# Patient Record
Sex: Female | Born: 1965
Health system: Southern US, Community
[De-identification: ages and names within clinical notes are randomized; demographics above are authoritative.]

## PROBLEM LIST (undated history)

## (undated) DIAGNOSIS — F319 Bipolar disorder, unspecified: Secondary | ICD-10-CM

## (undated) DIAGNOSIS — R51 Headache: Secondary | ICD-10-CM

## (undated) DIAGNOSIS — K6289 Other specified diseases of anus and rectum: Secondary | ICD-10-CM

## (undated) DIAGNOSIS — E039 Hypothyroidism, unspecified: Secondary | ICD-10-CM

## (undated) DIAGNOSIS — J189 Pneumonia, unspecified organism: Secondary | ICD-10-CM

## (undated) DIAGNOSIS — K635 Polyp of colon: Secondary | ICD-10-CM

## (undated) DIAGNOSIS — M898X9 Other specified disorders of bone, unspecified site: Secondary | ICD-10-CM

## (undated) DIAGNOSIS — M5126 Other intervertebral disc displacement, lumbar region: Secondary | ICD-10-CM

## (undated) DIAGNOSIS — K589 Irritable bowel syndrome without diarrhea: Secondary | ICD-10-CM

## (undated) DIAGNOSIS — F419 Anxiety disorder, unspecified: Secondary | ICD-10-CM

## (undated) DIAGNOSIS — M1990 Unspecified osteoarthritis, unspecified site: Secondary | ICD-10-CM

## (undated) DIAGNOSIS — B029 Zoster without complications: Secondary | ICD-10-CM

## (undated) DIAGNOSIS — F1910 Other psychoactive substance abuse, uncomplicated: Secondary | ICD-10-CM

## (undated) DIAGNOSIS — Z8659 Personal history of other mental and behavioral disorders: Secondary | ICD-10-CM

## (undated) DIAGNOSIS — C9110 Chronic lymphocytic leukemia of B-cell type not having achieved remission: Secondary | ICD-10-CM

## (undated) DIAGNOSIS — R519 Headache, unspecified: Secondary | ICD-10-CM

## (undated) DIAGNOSIS — F329 Major depressive disorder, single episode, unspecified: Secondary | ICD-10-CM

## (undated) DIAGNOSIS — J45909 Unspecified asthma, uncomplicated: Secondary | ICD-10-CM

## (undated) DIAGNOSIS — G8929 Other chronic pain: Secondary | ICD-10-CM

## (undated) DIAGNOSIS — F32A Depression, unspecified: Secondary | ICD-10-CM

## (undated) HISTORY — DX: Other specified diseases of anus and rectum: K62.89

## (undated) HISTORY — DX: Polyp of colon: K63.5

## (undated) HISTORY — DX: Unspecified asthma, uncomplicated: J45.909

## (undated) HISTORY — PX: APPENDECTOMY: SHX54

## (undated) HISTORY — DX: Unspecified osteoarthritis, unspecified site: M19.90

## (undated) HISTORY — DX: Zoster without complications: B02.9

## (undated) HISTORY — DX: Personal history of other mental and behavioral disorders: Z86.59

## (undated) HISTORY — DX: Anxiety disorder, unspecified: F41.9

## (undated) HISTORY — DX: Irritable bowel syndrome without diarrhea: K58.9

## (undated) HISTORY — PX: OOPHORECTOMY: SHX86

## (undated) HISTORY — DX: Pneumonia, unspecified organism: J18.9

## (undated) HISTORY — DX: Other chronic pain: G89.29

## (undated) HISTORY — DX: Other specified disorders of bone, unspecified site: M89.8X9

## (undated) HISTORY — DX: Major depressive disorder, single episode, unspecified: F32.9

## (undated) HISTORY — PX: TUBAL LIGATION: SHX77

## (undated) HISTORY — DX: Other intervertebral disc displacement, lumbar region: M51.26

## (undated) HISTORY — DX: Depression, unspecified: F32.A

## (undated) HISTORY — DX: Headache, unspecified: R51.9

## (undated) HISTORY — PX: CHOLECYSTECTOMY: SHX55

## (undated) HISTORY — DX: Bipolar disorder, unspecified: F31.9

## (undated) HISTORY — DX: Chronic lymphocytic leukemia of B-cell type not having achieved remission: C91.10

## (undated) HISTORY — DX: Headache: R51

## (undated) HISTORY — DX: Other psychoactive substance abuse, uncomplicated: F19.10

## (undated) HISTORY — PX: ABDOMINAL HYSTERECTOMY: SHX81

## (undated) HISTORY — DX: Hypothyroidism, unspecified: E03.9

---

## 1993-07-20 DIAGNOSIS — K589 Irritable bowel syndrome without diarrhea: Secondary | ICD-10-CM

## 1993-07-20 HISTORY — DX: Irritable bowel syndrome, unspecified: K58.9

## 2005-07-19 DIAGNOSIS — M5136 Other intervertebral disc degeneration, lumbar region: Secondary | ICD-10-CM

## 2005-07-19 DIAGNOSIS — M5126 Other intervertebral disc displacement, lumbar region: Secondary | ICD-10-CM

## 2005-07-19 DIAGNOSIS — M51369 Other intervertebral disc degeneration, lumbar region without mention of lumbar back pain or lower extremity pain: Secondary | ICD-10-CM

## 2005-07-19 HISTORY — DX: Other intervertebral disc degeneration, lumbar region: M51.36

## 2005-07-19 HISTORY — DX: Other intervertebral disc degeneration, lumbar region without mention of lumbar back pain or lower extremity pain: M51.369

## 2005-07-19 HISTORY — DX: Other intervertebral disc displacement, lumbar region: M51.26

## 2005-07-20 DIAGNOSIS — E039 Hypothyroidism, unspecified: Secondary | ICD-10-CM

## 2005-07-20 HISTORY — DX: Hypothyroidism, unspecified: E03.9

## 2010-03-20 HISTORY — PX: SHOULDER OPEN ROTATOR CUFF REPAIR: SHX2407

## 2012-06-19 HISTORY — PX: BUNIONECTOMY: SHX129

## 2013-07-25 DIAGNOSIS — F3160 Bipolar disorder, current episode mixed, unspecified: Secondary | ICD-10-CM | POA: Diagnosis not present

## 2013-07-25 DIAGNOSIS — F3289 Other specified depressive episodes: Secondary | ICD-10-CM | POA: Diagnosis not present

## 2013-07-25 DIAGNOSIS — Z79899 Other long term (current) drug therapy: Secondary | ICD-10-CM | POA: Diagnosis not present

## 2013-07-25 DIAGNOSIS — F329 Major depressive disorder, single episode, unspecified: Secondary | ICD-10-CM | POA: Diagnosis not present

## 2013-07-25 DIAGNOSIS — F603 Borderline personality disorder: Secondary | ICD-10-CM | POA: Diagnosis not present

## 2013-07-25 DIAGNOSIS — F319 Bipolar disorder, unspecified: Secondary | ICD-10-CM | POA: Diagnosis not present

## 2013-07-25 DIAGNOSIS — Z5181 Encounter for therapeutic drug level monitoring: Secondary | ICD-10-CM | POA: Diagnosis not present

## 2013-07-26 DIAGNOSIS — M545 Low back pain, unspecified: Secondary | ICD-10-CM | POA: Diagnosis not present

## 2013-07-26 DIAGNOSIS — M47817 Spondylosis without myelopathy or radiculopathy, lumbosacral region: Secondary | ICD-10-CM | POA: Diagnosis not present

## 2013-07-26 DIAGNOSIS — Z79899 Other long term (current) drug therapy: Secondary | ICD-10-CM | POA: Diagnosis not present

## 2013-07-26 DIAGNOSIS — M4120 Other idiopathic scoliosis, site unspecified: Secondary | ICD-10-CM | POA: Diagnosis not present

## 2013-07-26 DIAGNOSIS — Z0000 Encounter for general adult medical examination without abnormal findings: Secondary | ICD-10-CM | POA: Diagnosis not present

## 2013-07-26 DIAGNOSIS — E039 Hypothyroidism, unspecified: Secondary | ICD-10-CM | POA: Diagnosis not present

## 2013-07-26 DIAGNOSIS — F319 Bipolar disorder, unspecified: Secondary | ICD-10-CM | POA: Diagnosis not present

## 2013-07-26 DIAGNOSIS — E559 Vitamin D deficiency, unspecified: Secondary | ICD-10-CM | POA: Diagnosis not present

## 2013-08-01 DIAGNOSIS — F3289 Other specified depressive episodes: Secondary | ICD-10-CM | POA: Diagnosis not present

## 2013-08-01 DIAGNOSIS — F329 Major depressive disorder, single episode, unspecified: Secondary | ICD-10-CM | POA: Diagnosis not present

## 2013-08-08 DIAGNOSIS — F3289 Other specified depressive episodes: Secondary | ICD-10-CM | POA: Diagnosis not present

## 2013-08-08 DIAGNOSIS — F329 Major depressive disorder, single episode, unspecified: Secondary | ICD-10-CM | POA: Diagnosis not present

## 2013-08-15 DIAGNOSIS — F3289 Other specified depressive episodes: Secondary | ICD-10-CM | POA: Diagnosis not present

## 2013-08-15 DIAGNOSIS — F329 Major depressive disorder, single episode, unspecified: Secondary | ICD-10-CM | POA: Diagnosis not present

## 2013-08-17 DIAGNOSIS — K5900 Constipation, unspecified: Secondary | ICD-10-CM | POA: Diagnosis not present

## 2013-08-17 DIAGNOSIS — R1032 Left lower quadrant pain: Secondary | ICD-10-CM | POA: Diagnosis not present

## 2013-08-17 DIAGNOSIS — K6289 Other specified diseases of anus and rectum: Secondary | ICD-10-CM | POA: Diagnosis not present

## 2013-08-22 DIAGNOSIS — F329 Major depressive disorder, single episode, unspecified: Secondary | ICD-10-CM | POA: Diagnosis not present

## 2013-08-22 DIAGNOSIS — F3289 Other specified depressive episodes: Secondary | ICD-10-CM | POA: Diagnosis not present

## 2013-08-29 DIAGNOSIS — F329 Major depressive disorder, single episode, unspecified: Secondary | ICD-10-CM | POA: Diagnosis not present

## 2013-08-29 DIAGNOSIS — F3289 Other specified depressive episodes: Secondary | ICD-10-CM | POA: Diagnosis not present

## 2013-08-30 DIAGNOSIS — M47817 Spondylosis without myelopathy or radiculopathy, lumbosacral region: Secondary | ICD-10-CM | POA: Diagnosis not present

## 2013-08-30 DIAGNOSIS — M51379 Other intervertebral disc degeneration, lumbosacral region without mention of lumbar back pain or lower extremity pain: Secondary | ICD-10-CM | POA: Diagnosis not present

## 2013-08-30 DIAGNOSIS — M5137 Other intervertebral disc degeneration, lumbosacral region: Secondary | ICD-10-CM | POA: Diagnosis not present

## 2013-08-30 DIAGNOSIS — IMO0002 Reserved for concepts with insufficient information to code with codable children: Secondary | ICD-10-CM | POA: Diagnosis not present

## 2013-09-07 DIAGNOSIS — F329 Major depressive disorder, single episode, unspecified: Secondary | ICD-10-CM | POA: Diagnosis not present

## 2013-09-07 DIAGNOSIS — F3289 Other specified depressive episodes: Secondary | ICD-10-CM | POA: Diagnosis not present

## 2013-09-11 DIAGNOSIS — F4310 Post-traumatic stress disorder, unspecified: Secondary | ICD-10-CM | POA: Diagnosis not present

## 2013-09-11 DIAGNOSIS — F411 Generalized anxiety disorder: Secondary | ICD-10-CM | POA: Diagnosis not present

## 2013-09-11 DIAGNOSIS — F319 Bipolar disorder, unspecified: Secondary | ICD-10-CM | POA: Diagnosis not present

## 2013-09-11 DIAGNOSIS — E039 Hypothyroidism, unspecified: Secondary | ICD-10-CM | POA: Diagnosis not present

## 2013-09-19 DIAGNOSIS — F3289 Other specified depressive episodes: Secondary | ICD-10-CM | POA: Diagnosis not present

## 2013-09-19 DIAGNOSIS — F329 Major depressive disorder, single episode, unspecified: Secondary | ICD-10-CM | POA: Diagnosis not present

## 2013-09-19 DIAGNOSIS — Z5181 Encounter for therapeutic drug level monitoring: Secondary | ICD-10-CM | POA: Diagnosis not present

## 2013-09-19 DIAGNOSIS — Z79899 Other long term (current) drug therapy: Secondary | ICD-10-CM | POA: Diagnosis not present

## 2013-09-19 DIAGNOSIS — F3160 Bipolar disorder, current episode mixed, unspecified: Secondary | ICD-10-CM | POA: Diagnosis not present

## 2013-09-19 DIAGNOSIS — F603 Borderline personality disorder: Secondary | ICD-10-CM | POA: Diagnosis not present

## 2013-09-21 DIAGNOSIS — F3289 Other specified depressive episodes: Secondary | ICD-10-CM | POA: Diagnosis not present

## 2013-09-21 DIAGNOSIS — F329 Major depressive disorder, single episode, unspecified: Secondary | ICD-10-CM | POA: Diagnosis not present

## 2013-09-21 DIAGNOSIS — M545 Low back pain, unspecified: Secondary | ICD-10-CM | POA: Diagnosis not present

## 2013-09-28 DIAGNOSIS — F3289 Other specified depressive episodes: Secondary | ICD-10-CM | POA: Diagnosis not present

## 2013-09-28 DIAGNOSIS — F329 Major depressive disorder, single episode, unspecified: Secondary | ICD-10-CM | POA: Diagnosis not present

## 2013-10-03 DIAGNOSIS — K589 Irritable bowel syndrome without diarrhea: Secondary | ICD-10-CM | POA: Diagnosis not present

## 2013-10-03 DIAGNOSIS — E039 Hypothyroidism, unspecified: Secondary | ICD-10-CM | POA: Diagnosis not present

## 2013-10-03 DIAGNOSIS — Z79899 Other long term (current) drug therapy: Secondary | ICD-10-CM | POA: Diagnosis not present

## 2013-10-03 DIAGNOSIS — F603 Borderline personality disorder: Secondary | ICD-10-CM | POA: Diagnosis present

## 2013-10-03 DIAGNOSIS — Z5181 Encounter for therapeutic drug level monitoring: Secondary | ICD-10-CM | POA: Diagnosis not present

## 2013-10-03 DIAGNOSIS — J329 Chronic sinusitis, unspecified: Secondary | ICD-10-CM | POA: Diagnosis not present

## 2013-10-03 DIAGNOSIS — M542 Cervicalgia: Secondary | ICD-10-CM | POA: Diagnosis not present

## 2013-10-03 DIAGNOSIS — F319 Bipolar disorder, unspecified: Secondary | ICD-10-CM | POA: Diagnosis not present

## 2013-10-03 DIAGNOSIS — F39 Unspecified mood [affective] disorder: Secondary | ICD-10-CM | POA: Diagnosis not present

## 2013-10-03 DIAGNOSIS — J45909 Unspecified asthma, uncomplicated: Secondary | ICD-10-CM | POA: Diagnosis not present

## 2013-10-10 DIAGNOSIS — F329 Major depressive disorder, single episode, unspecified: Secondary | ICD-10-CM | POA: Diagnosis not present

## 2013-10-10 DIAGNOSIS — F3289 Other specified depressive episodes: Secondary | ICD-10-CM | POA: Diagnosis not present

## 2013-10-11 DIAGNOSIS — F3160 Bipolar disorder, current episode mixed, unspecified: Secondary | ICD-10-CM | POA: Diagnosis not present

## 2013-10-11 DIAGNOSIS — F319 Bipolar disorder, unspecified: Secondary | ICD-10-CM | POA: Diagnosis not present

## 2013-10-11 DIAGNOSIS — Z79899 Other long term (current) drug therapy: Secondary | ICD-10-CM | POA: Diagnosis not present

## 2013-10-11 DIAGNOSIS — Z5181 Encounter for therapeutic drug level monitoring: Secondary | ICD-10-CM | POA: Diagnosis not present

## 2013-10-12 DIAGNOSIS — F329 Major depressive disorder, single episode, unspecified: Secondary | ICD-10-CM | POA: Diagnosis not present

## 2013-10-12 DIAGNOSIS — F3289 Other specified depressive episodes: Secondary | ICD-10-CM | POA: Diagnosis not present

## 2013-10-18 DIAGNOSIS — Z79899 Other long term (current) drug therapy: Secondary | ICD-10-CM | POA: Diagnosis not present

## 2013-10-18 DIAGNOSIS — F3110 Bipolar disorder, current episode manic without psychotic features, unspecified: Secondary | ICD-10-CM | POA: Diagnosis not present

## 2013-10-18 DIAGNOSIS — F603 Borderline personality disorder: Secondary | ICD-10-CM | POA: Diagnosis not present

## 2013-10-18 DIAGNOSIS — F3160 Bipolar disorder, current episode mixed, unspecified: Secondary | ICD-10-CM | POA: Diagnosis not present

## 2013-10-18 DIAGNOSIS — Z5181 Encounter for therapeutic drug level monitoring: Secondary | ICD-10-CM | POA: Diagnosis not present

## 2013-10-19 DIAGNOSIS — F3289 Other specified depressive episodes: Secondary | ICD-10-CM | POA: Diagnosis not present

## 2013-10-19 DIAGNOSIS — F329 Major depressive disorder, single episode, unspecified: Secondary | ICD-10-CM | POA: Diagnosis not present

## 2013-10-24 DIAGNOSIS — F329 Major depressive disorder, single episode, unspecified: Secondary | ICD-10-CM | POA: Diagnosis not present

## 2013-10-24 DIAGNOSIS — F3289 Other specified depressive episodes: Secondary | ICD-10-CM | POA: Diagnosis not present

## 2013-10-26 DIAGNOSIS — F329 Major depressive disorder, single episode, unspecified: Secondary | ICD-10-CM | POA: Diagnosis not present

## 2013-10-26 DIAGNOSIS — F3289 Other specified depressive episodes: Secondary | ICD-10-CM | POA: Diagnosis not present

## 2013-10-27 DIAGNOSIS — N842 Polyp of vagina: Secondary | ICD-10-CM | POA: Diagnosis not present

## 2013-10-27 DIAGNOSIS — J309 Allergic rhinitis, unspecified: Secondary | ICD-10-CM | POA: Diagnosis not present

## 2013-10-31 DIAGNOSIS — F3289 Other specified depressive episodes: Secondary | ICD-10-CM | POA: Diagnosis not present

## 2013-10-31 DIAGNOSIS — F329 Major depressive disorder, single episode, unspecified: Secondary | ICD-10-CM | POA: Diagnosis not present

## 2013-11-09 DIAGNOSIS — R6889 Other general symptoms and signs: Secondary | ICD-10-CM | POA: Diagnosis not present

## 2013-11-09 DIAGNOSIS — R110 Nausea: Secondary | ICD-10-CM | POA: Diagnosis not present

## 2013-11-09 DIAGNOSIS — R51 Headache: Secondary | ICD-10-CM | POA: Diagnosis not present

## 2013-11-09 DIAGNOSIS — Z882 Allergy status to sulfonamides status: Secondary | ICD-10-CM | POA: Diagnosis not present

## 2013-11-09 DIAGNOSIS — Z880 Allergy status to penicillin: Secondary | ICD-10-CM | POA: Diagnosis not present

## 2013-11-09 DIAGNOSIS — R42 Dizziness and giddiness: Secondary | ICD-10-CM | POA: Diagnosis not present

## 2013-11-10 DIAGNOSIS — M47817 Spondylosis without myelopathy or radiculopathy, lumbosacral region: Secondary | ICD-10-CM | POA: Diagnosis not present

## 2013-11-10 DIAGNOSIS — D169 Benign neoplasm of bone and articular cartilage, unspecified: Secondary | ICD-10-CM | POA: Diagnosis not present

## 2013-11-10 DIAGNOSIS — T426X1A Poisoning by other antiepileptic and sedative-hypnotic drugs, accidental (unintentional), initial encounter: Secondary | ICD-10-CM | POA: Diagnosis not present

## 2013-11-10 DIAGNOSIS — R5381 Other malaise: Secondary | ICD-10-CM | POA: Diagnosis not present

## 2013-11-10 DIAGNOSIS — M542 Cervicalgia: Secondary | ICD-10-CM | POA: Diagnosis not present

## 2013-11-10 DIAGNOSIS — M48061 Spinal stenosis, lumbar region without neurogenic claudication: Secondary | ICD-10-CM | POA: Diagnosis not present

## 2013-11-10 DIAGNOSIS — J45909 Unspecified asthma, uncomplicated: Secondary | ICD-10-CM | POA: Diagnosis not present

## 2013-11-10 DIAGNOSIS — E039 Hypothyroidism, unspecified: Secondary | ICD-10-CM | POA: Diagnosis not present

## 2013-11-10 DIAGNOSIS — F4321 Adjustment disorder with depressed mood: Secondary | ICD-10-CM | POA: Diagnosis not present

## 2013-11-10 DIAGNOSIS — R5383 Other fatigue: Secondary | ICD-10-CM | POA: Diagnosis not present

## 2013-11-10 DIAGNOSIS — T50901A Poisoning by unspecified drugs, medicaments and biological substances, accidental (unintentional), initial encounter: Secondary | ICD-10-CM | POA: Diagnosis not present

## 2013-11-10 DIAGNOSIS — T424X4A Poisoning by benzodiazepines, undetermined, initial encounter: Secondary | ICD-10-CM | POA: Diagnosis not present

## 2013-11-10 DIAGNOSIS — Z79899 Other long term (current) drug therapy: Secondary | ICD-10-CM | POA: Diagnosis not present

## 2013-11-10 DIAGNOSIS — M47812 Spondylosis without myelopathy or radiculopathy, cervical region: Secondary | ICD-10-CM | POA: Diagnosis not present

## 2013-11-10 DIAGNOSIS — K219 Gastro-esophageal reflux disease without esophagitis: Secondary | ICD-10-CM | POA: Diagnosis not present

## 2013-11-10 DIAGNOSIS — F4320 Adjustment disorder, unspecified: Secondary | ICD-10-CM | POA: Diagnosis not present

## 2013-11-10 DIAGNOSIS — R4182 Altered mental status, unspecified: Secondary | ICD-10-CM | POA: Diagnosis not present

## 2013-11-10 DIAGNOSIS — F3110 Bipolar disorder, current episode manic without psychotic features, unspecified: Secondary | ICD-10-CM | POA: Diagnosis not present

## 2013-11-10 DIAGNOSIS — R45851 Suicidal ideations: Secondary | ICD-10-CM | POA: Diagnosis not present

## 2013-11-10 DIAGNOSIS — F603 Borderline personality disorder: Secondary | ICD-10-CM | POA: Diagnosis not present

## 2013-11-10 DIAGNOSIS — F319 Bipolar disorder, unspecified: Secondary | ICD-10-CM | POA: Diagnosis not present

## 2013-11-11 DIAGNOSIS — E039 Hypothyroidism, unspecified: Secondary | ICD-10-CM | POA: Diagnosis not present

## 2013-11-11 DIAGNOSIS — T50901A Poisoning by unspecified drugs, medicaments and biological substances, accidental (unintentional), initial encounter: Secondary | ICD-10-CM | POA: Diagnosis not present

## 2013-11-11 DIAGNOSIS — F319 Bipolar disorder, unspecified: Secondary | ICD-10-CM | POA: Diagnosis not present

## 2013-11-11 DIAGNOSIS — R45851 Suicidal ideations: Secondary | ICD-10-CM | POA: Diagnosis not present

## 2013-11-12 DIAGNOSIS — T50901A Poisoning by unspecified drugs, medicaments and biological substances, accidental (unintentional), initial encounter: Secondary | ICD-10-CM | POA: Diagnosis not present

## 2013-11-12 DIAGNOSIS — F319 Bipolar disorder, unspecified: Secondary | ICD-10-CM | POA: Diagnosis not present

## 2013-11-12 DIAGNOSIS — R45851 Suicidal ideations: Secondary | ICD-10-CM | POA: Diagnosis not present

## 2013-11-12 DIAGNOSIS — E039 Hypothyroidism, unspecified: Secondary | ICD-10-CM | POA: Diagnosis not present

## 2013-11-13 DIAGNOSIS — E039 Hypothyroidism, unspecified: Secondary | ICD-10-CM | POA: Diagnosis not present

## 2013-11-13 DIAGNOSIS — T50901A Poisoning by unspecified drugs, medicaments and biological substances, accidental (unintentional), initial encounter: Secondary | ICD-10-CM | POA: Diagnosis not present

## 2013-11-13 DIAGNOSIS — F319 Bipolar disorder, unspecified: Secondary | ICD-10-CM | POA: Diagnosis not present

## 2013-11-13 DIAGNOSIS — F603 Borderline personality disorder: Secondary | ICD-10-CM | POA: Diagnosis not present

## 2013-11-13 DIAGNOSIS — R45851 Suicidal ideations: Secondary | ICD-10-CM | POA: Diagnosis not present

## 2013-11-21 DIAGNOSIS — F319 Bipolar disorder, unspecified: Secondary | ICD-10-CM | POA: Diagnosis not present

## 2013-11-21 DIAGNOSIS — Z79899 Other long term (current) drug therapy: Secondary | ICD-10-CM | POA: Diagnosis not present

## 2013-11-21 DIAGNOSIS — F4323 Adjustment disorder with mixed anxiety and depressed mood: Secondary | ICD-10-CM | POA: Diagnosis not present

## 2013-11-21 DIAGNOSIS — Z5181 Encounter for therapeutic drug level monitoring: Secondary | ICD-10-CM | POA: Diagnosis not present

## 2013-11-21 DIAGNOSIS — F603 Borderline personality disorder: Secondary | ICD-10-CM | POA: Diagnosis not present

## 2013-11-30 DIAGNOSIS — F603 Borderline personality disorder: Secondary | ICD-10-CM | POA: Diagnosis not present

## 2013-12-07 DIAGNOSIS — F603 Borderline personality disorder: Secondary | ICD-10-CM | POA: Diagnosis not present

## 2013-12-19 DIAGNOSIS — F603 Borderline personality disorder: Secondary | ICD-10-CM | POA: Diagnosis not present

## 2013-12-19 DIAGNOSIS — F6081 Narcissistic personality disorder: Secondary | ICD-10-CM | POA: Diagnosis not present

## 2013-12-25 DIAGNOSIS — J0100 Acute maxillary sinusitis, unspecified: Secondary | ICD-10-CM | POA: Diagnosis not present

## 2013-12-25 DIAGNOSIS — E038 Other specified hypothyroidism: Secondary | ICD-10-CM | POA: Diagnosis not present

## 2013-12-28 DIAGNOSIS — R21 Rash and other nonspecific skin eruption: Secondary | ICD-10-CM | POA: Diagnosis not present

## 2014-01-01 DIAGNOSIS — F603 Borderline personality disorder: Secondary | ICD-10-CM | POA: Diagnosis not present

## 2014-01-01 DIAGNOSIS — F6081 Narcissistic personality disorder: Secondary | ICD-10-CM | POA: Diagnosis not present

## 2014-01-02 DIAGNOSIS — F319 Bipolar disorder, unspecified: Secondary | ICD-10-CM | POA: Diagnosis not present

## 2014-01-02 DIAGNOSIS — Z79899 Other long term (current) drug therapy: Secondary | ICD-10-CM | POA: Diagnosis not present

## 2014-01-02 DIAGNOSIS — Z5181 Encounter for therapeutic drug level monitoring: Secondary | ICD-10-CM | POA: Diagnosis not present

## 2014-01-02 DIAGNOSIS — F603 Borderline personality disorder: Secondary | ICD-10-CM | POA: Diagnosis not present

## 2014-01-26 DIAGNOSIS — F603 Borderline personality disorder: Secondary | ICD-10-CM | POA: Diagnosis not present

## 2014-01-30 DIAGNOSIS — F603 Borderline personality disorder: Secondary | ICD-10-CM | POA: Diagnosis not present

## 2014-02-05 DIAGNOSIS — F603 Borderline personality disorder: Secondary | ICD-10-CM | POA: Diagnosis not present

## 2014-02-06 DIAGNOSIS — F603 Borderline personality disorder: Secondary | ICD-10-CM | POA: Diagnosis not present

## 2014-02-12 DIAGNOSIS — H2510 Age-related nuclear cataract, unspecified eye: Secondary | ICD-10-CM | POA: Diagnosis not present

## 2014-02-12 DIAGNOSIS — H524 Presbyopia: Secondary | ICD-10-CM | POA: Diagnosis not present

## 2014-02-13 DIAGNOSIS — F603 Borderline personality disorder: Secondary | ICD-10-CM | POA: Diagnosis not present

## 2014-02-20 DIAGNOSIS — F339 Major depressive disorder, recurrent, unspecified: Secondary | ICD-10-CM | POA: Diagnosis not present

## 2014-02-20 DIAGNOSIS — D509 Iron deficiency anemia, unspecified: Secondary | ICD-10-CM | POA: Diagnosis not present

## 2014-02-20 DIAGNOSIS — E039 Hypothyroidism, unspecified: Secondary | ICD-10-CM | POA: Diagnosis not present

## 2014-02-20 DIAGNOSIS — F603 Borderline personality disorder: Secondary | ICD-10-CM | POA: Diagnosis not present

## 2014-02-20 DIAGNOSIS — J069 Acute upper respiratory infection, unspecified: Secondary | ICD-10-CM | POA: Diagnosis not present

## 2014-02-20 DIAGNOSIS — M542 Cervicalgia: Secondary | ICD-10-CM | POA: Diagnosis not present

## 2014-02-22 DIAGNOSIS — Z5181 Encounter for therapeutic drug level monitoring: Secondary | ICD-10-CM | POA: Diagnosis not present

## 2014-02-22 DIAGNOSIS — Z882 Allergy status to sulfonamides status: Secondary | ICD-10-CM | POA: Diagnosis not present

## 2014-02-22 DIAGNOSIS — Z888 Allergy status to other drugs, medicaments and biological substances status: Secondary | ICD-10-CM | POA: Diagnosis not present

## 2014-02-22 DIAGNOSIS — Z881 Allergy status to other antibiotic agents status: Secondary | ICD-10-CM | POA: Diagnosis not present

## 2014-02-22 DIAGNOSIS — Z79899 Other long term (current) drug therapy: Secondary | ICD-10-CM | POA: Diagnosis not present

## 2014-02-22 DIAGNOSIS — F319 Bipolar disorder, unspecified: Secondary | ICD-10-CM | POA: Diagnosis not present

## 2014-02-22 DIAGNOSIS — F603 Borderline personality disorder: Secondary | ICD-10-CM | POA: Diagnosis not present

## 2014-02-23 DIAGNOSIS — F603 Borderline personality disorder: Secondary | ICD-10-CM | POA: Diagnosis not present

## 2014-02-27 DIAGNOSIS — F603 Borderline personality disorder: Secondary | ICD-10-CM | POA: Diagnosis not present

## 2014-03-02 DIAGNOSIS — R55 Syncope and collapse: Secondary | ICD-10-CM | POA: Diagnosis not present

## 2014-03-02 DIAGNOSIS — G4710 Hypersomnia, unspecified: Secondary | ICD-10-CM | POA: Diagnosis not present

## 2014-03-02 DIAGNOSIS — D509 Iron deficiency anemia, unspecified: Secondary | ICD-10-CM | POA: Diagnosis not present

## 2014-03-05 DIAGNOSIS — J309 Allergic rhinitis, unspecified: Secondary | ICD-10-CM | POA: Diagnosis not present

## 2014-03-06 DIAGNOSIS — F603 Borderline personality disorder: Secondary | ICD-10-CM | POA: Diagnosis not present

## 2014-03-07 DIAGNOSIS — J45902 Unspecified asthma with status asthmaticus: Secondary | ICD-10-CM | POA: Diagnosis not present

## 2014-03-07 DIAGNOSIS — R55 Syncope and collapse: Secondary | ICD-10-CM | POA: Diagnosis not present

## 2014-03-09 DIAGNOSIS — Z5181 Encounter for therapeutic drug level monitoring: Secondary | ICD-10-CM | POA: Diagnosis not present

## 2014-03-09 DIAGNOSIS — F319 Bipolar disorder, unspecified: Secondary | ICD-10-CM | POA: Diagnosis not present

## 2014-03-09 DIAGNOSIS — F603 Borderline personality disorder: Secondary | ICD-10-CM | POA: Diagnosis not present

## 2014-03-09 DIAGNOSIS — Z79899 Other long term (current) drug therapy: Secondary | ICD-10-CM | POA: Diagnosis not present

## 2014-03-13 DIAGNOSIS — F603 Borderline personality disorder: Secondary | ICD-10-CM | POA: Diagnosis not present

## 2014-04-03 DIAGNOSIS — F603 Borderline personality disorder: Secondary | ICD-10-CM | POA: Diagnosis not present

## 2014-04-05 DIAGNOSIS — F319 Bipolar disorder, unspecified: Secondary | ICD-10-CM | POA: Diagnosis not present

## 2014-04-05 DIAGNOSIS — F603 Borderline personality disorder: Secondary | ICD-10-CM | POA: Diagnosis not present

## 2014-04-05 DIAGNOSIS — Z5181 Encounter for therapeutic drug level monitoring: Secondary | ICD-10-CM | POA: Diagnosis not present

## 2014-04-05 DIAGNOSIS — Z79899 Other long term (current) drug therapy: Secondary | ICD-10-CM | POA: Diagnosis not present

## 2014-04-05 DIAGNOSIS — D509 Iron deficiency anemia, unspecified: Secondary | ICD-10-CM | POA: Diagnosis not present

## 2014-04-10 DIAGNOSIS — R55 Syncope and collapse: Secondary | ICD-10-CM | POA: Diagnosis not present

## 2014-04-10 DIAGNOSIS — F603 Borderline personality disorder: Secondary | ICD-10-CM | POA: Diagnosis not present

## 2014-04-15 DIAGNOSIS — Z23 Encounter for immunization: Secondary | ICD-10-CM | POA: Diagnosis not present

## 2014-04-17 DIAGNOSIS — F603 Borderline personality disorder: Secondary | ICD-10-CM | POA: Diagnosis not present

## 2014-04-18 DIAGNOSIS — T7840XA Allergy, unspecified, initial encounter: Secondary | ICD-10-CM | POA: Diagnosis not present

## 2014-04-20 DIAGNOSIS — R0789 Other chest pain: Secondary | ICD-10-CM | POA: Diagnosis not present

## 2014-04-20 DIAGNOSIS — I471 Supraventricular tachycardia: Secondary | ICD-10-CM | POA: Diagnosis not present

## 2014-04-20 DIAGNOSIS — R9439 Abnormal result of other cardiovascular function study: Secondary | ICD-10-CM | POA: Diagnosis not present

## 2014-04-20 DIAGNOSIS — R55 Syncope and collapse: Secondary | ICD-10-CM | POA: Diagnosis not present

## 2014-04-24 DIAGNOSIS — F603 Borderline personality disorder: Secondary | ICD-10-CM | POA: Diagnosis not present

## 2014-04-25 DIAGNOSIS — Z5181 Encounter for therapeutic drug level monitoring: Secondary | ICD-10-CM | POA: Diagnosis not present

## 2014-04-25 DIAGNOSIS — F603 Borderline personality disorder: Secondary | ICD-10-CM | POA: Diagnosis not present

## 2014-04-25 DIAGNOSIS — Z79899 Other long term (current) drug therapy: Secondary | ICD-10-CM | POA: Diagnosis not present

## 2014-04-25 DIAGNOSIS — F319 Bipolar disorder, unspecified: Secondary | ICD-10-CM | POA: Diagnosis not present

## 2014-05-07 DIAGNOSIS — F603 Borderline personality disorder: Secondary | ICD-10-CM | POA: Diagnosis not present

## 2014-05-14 DIAGNOSIS — F603 Borderline personality disorder: Secondary | ICD-10-CM | POA: Diagnosis not present

## 2014-05-15 DIAGNOSIS — F603 Borderline personality disorder: Secondary | ICD-10-CM | POA: Diagnosis not present

## 2014-05-22 DIAGNOSIS — F603 Borderline personality disorder: Secondary | ICD-10-CM | POA: Diagnosis not present

## 2014-05-28 DIAGNOSIS — J301 Allergic rhinitis due to pollen: Secondary | ICD-10-CM | POA: Diagnosis not present

## 2014-05-28 DIAGNOSIS — D509 Iron deficiency anemia, unspecified: Secondary | ICD-10-CM | POA: Diagnosis not present

## 2014-05-28 DIAGNOSIS — E064 Drug-induced thyroiditis: Secondary | ICD-10-CM | POA: Diagnosis not present

## 2014-05-28 DIAGNOSIS — I471 Supraventricular tachycardia: Secondary | ICD-10-CM | POA: Diagnosis not present

## 2014-05-29 DIAGNOSIS — F603 Borderline personality disorder: Secondary | ICD-10-CM | POA: Diagnosis not present

## 2014-06-05 DIAGNOSIS — F603 Borderline personality disorder: Secondary | ICD-10-CM | POA: Diagnosis not present

## 2014-06-12 DIAGNOSIS — F603 Borderline personality disorder: Secondary | ICD-10-CM | POA: Diagnosis not present

## 2014-06-13 DIAGNOSIS — Z79899 Other long term (current) drug therapy: Secondary | ICD-10-CM | POA: Diagnosis not present

## 2014-06-13 DIAGNOSIS — F3181 Bipolar II disorder: Secondary | ICD-10-CM | POA: Diagnosis not present

## 2014-06-13 DIAGNOSIS — F603 Borderline personality disorder: Secondary | ICD-10-CM | POA: Diagnosis not present

## 2014-06-13 DIAGNOSIS — F3176 Bipolar disorder, in full remission, most recent episode depressed: Secondary | ICD-10-CM | POA: Diagnosis not present

## 2014-06-13 DIAGNOSIS — Z5181 Encounter for therapeutic drug level monitoring: Secondary | ICD-10-CM | POA: Diagnosis not present

## 2014-06-18 DIAGNOSIS — F603 Borderline personality disorder: Secondary | ICD-10-CM | POA: Diagnosis not present

## 2014-06-19 DIAGNOSIS — F603 Borderline personality disorder: Secondary | ICD-10-CM | POA: Diagnosis not present

## 2014-06-26 DIAGNOSIS — F603 Borderline personality disorder: Secondary | ICD-10-CM | POA: Diagnosis not present

## 2014-07-03 DIAGNOSIS — F603 Borderline personality disorder: Secondary | ICD-10-CM | POA: Diagnosis not present

## 2014-07-06 DIAGNOSIS — F603 Borderline personality disorder: Secondary | ICD-10-CM | POA: Diagnosis not present

## 2014-07-10 DIAGNOSIS — F603 Borderline personality disorder: Secondary | ICD-10-CM | POA: Diagnosis not present

## 2014-07-11 DIAGNOSIS — J3489 Other specified disorders of nose and nasal sinuses: Secondary | ICD-10-CM | POA: Diagnosis not present

## 2014-07-17 DIAGNOSIS — F603 Borderline personality disorder: Secondary | ICD-10-CM | POA: Diagnosis not present

## 2014-07-19 DIAGNOSIS — F603 Borderline personality disorder: Secondary | ICD-10-CM | POA: Diagnosis not present

## 2014-07-24 DIAGNOSIS — Z79899 Other long term (current) drug therapy: Secondary | ICD-10-CM | POA: Diagnosis not present

## 2014-07-24 DIAGNOSIS — Z5181 Encounter for therapeutic drug level monitoring: Secondary | ICD-10-CM | POA: Diagnosis not present

## 2014-07-24 DIAGNOSIS — F419 Anxiety disorder, unspecified: Secondary | ICD-10-CM | POA: Diagnosis not present

## 2014-07-24 DIAGNOSIS — F3181 Bipolar II disorder: Secondary | ICD-10-CM | POA: Diagnosis not present

## 2014-07-24 DIAGNOSIS — K589 Irritable bowel syndrome without diarrhea: Secondary | ICD-10-CM | POA: Diagnosis not present

## 2014-07-24 DIAGNOSIS — F603 Borderline personality disorder: Secondary | ICD-10-CM | POA: Diagnosis not present

## 2014-10-26 DIAGNOSIS — F3181 Bipolar II disorder: Secondary | ICD-10-CM | POA: Diagnosis not present

## 2014-10-29 DIAGNOSIS — D509 Iron deficiency anemia, unspecified: Secondary | ICD-10-CM | POA: Diagnosis not present

## 2014-10-29 DIAGNOSIS — G44229 Chronic tension-type headache, not intractable: Secondary | ICD-10-CM | POA: Diagnosis not present

## 2014-10-29 DIAGNOSIS — Q666 Other congenital valgus deformities of feet: Secondary | ICD-10-CM | POA: Diagnosis not present

## 2014-10-29 DIAGNOSIS — S86899A Other injury of other muscle(s) and tendon(s) at lower leg level, unspecified leg, initial encounter: Secondary | ICD-10-CM | POA: Diagnosis not present

## 2014-10-30 DIAGNOSIS — Z79899 Other long term (current) drug therapy: Secondary | ICD-10-CM | POA: Diagnosis not present

## 2014-10-30 DIAGNOSIS — F603 Borderline personality disorder: Secondary | ICD-10-CM | POA: Diagnosis not present

## 2014-10-30 DIAGNOSIS — F3181 Bipolar II disorder: Secondary | ICD-10-CM | POA: Diagnosis not present

## 2014-10-30 DIAGNOSIS — Z5181 Encounter for therapeutic drug level monitoring: Secondary | ICD-10-CM | POA: Diagnosis not present

## 2014-11-05 DIAGNOSIS — F3181 Bipolar II disorder: Secondary | ICD-10-CM | POA: Diagnosis not present

## 2014-11-13 DIAGNOSIS — F603 Borderline personality disorder: Secondary | ICD-10-CM | POA: Diagnosis not present

## 2014-11-13 DIAGNOSIS — F3181 Bipolar II disorder: Secondary | ICD-10-CM | POA: Diagnosis not present

## 2014-12-12 ENCOUNTER — Encounter (INDEPENDENT_AMBULATORY_CARE_PROVIDER_SITE_OTHER): Payer: Self-pay

## 2014-12-12 ENCOUNTER — Encounter (HOSPITAL_COMMUNITY): Payer: Self-pay | Admitting: Clinical

## 2014-12-12 ENCOUNTER — Ambulatory Visit (INDEPENDENT_AMBULATORY_CARE_PROVIDER_SITE_OTHER): Payer: Medicare Other | Admitting: Clinical

## 2014-12-12 DIAGNOSIS — F603 Borderline personality disorder: Secondary | ICD-10-CM | POA: Diagnosis not present

## 2014-12-12 DIAGNOSIS — F319 Bipolar disorder, unspecified: Secondary | ICD-10-CM | POA: Diagnosis not present
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
## 2014-12-19 DIAGNOSIS — R1084 Generalized abdominal pain: Secondary | ICD-10-CM | POA: Diagnosis not present

## 2014-12-19 DIAGNOSIS — R197 Diarrhea, unspecified: Secondary | ICD-10-CM | POA: Diagnosis not present

## 2014-12-26 DIAGNOSIS — J029 Acute pharyngitis, unspecified: Secondary | ICD-10-CM | POA: Diagnosis not present

## 2015-01-10 ENCOUNTER — Ambulatory Visit (INDEPENDENT_AMBULATORY_CARE_PROVIDER_SITE_OTHER): Payer: Medicare Other | Admitting: Psychiatry

## 2015-01-10 ENCOUNTER — Encounter (HOSPITAL_COMMUNITY): Payer: Self-pay | Admitting: Psychiatry

## 2015-01-10 VITALS — BP 120/78 | HR 76 | Ht 63.5 in | Wt 167.8 lb

## 2015-01-10 DIAGNOSIS — F3160 Bipolar disorder, current episode mixed, unspecified: Secondary | ICD-10-CM

## 2015-01-10 DIAGNOSIS — F603 Borderline personality disorder: Secondary | ICD-10-CM

## 2015-01-10 MED ORDER — GABAPENTIN 100 MG PO CAPS: ORAL_CAPSULE | ORAL | Status: DC
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
## 2015-01-15 ENCOUNTER — Encounter (HOSPITAL_COMMUNITY): Payer: Self-pay | Admitting: Clinical

## 2015-01-15 ENCOUNTER — Ambulatory Visit (INDEPENDENT_AMBULATORY_CARE_PROVIDER_SITE_OTHER): Payer: Medicare Other | Admitting: Clinical

## 2015-01-15 DIAGNOSIS — F603 Borderline personality disorder: Secondary | ICD-10-CM | POA: Diagnosis not present

## 2015-01-15 DIAGNOSIS — F319 Bipolar disorder, unspecified: Secondary | ICD-10-CM | POA: Diagnosis not present

## 2015-01-15 NOTE — Progress Notes (Signed)
   THERAPIST PROGRESS NOTE  Session Time: 9:05 -10;00  Participation Level: Active  Behavioral Response: CasualAlertDepressed  Type of Therapy: Individual Therapy  Treatment Goals addressed: Improve psychiatric symptoms  Interventions: Motivational Interviewing  Summary: Morgan [name] is a 49 y.o. female who presents with Bipolar I disorder and Borderline personality disorder.   Suicidal/Homicidal: Nowithout intent/plan  Therapist Response: Ron met with clinician for an individual session. Morgan [name] shared her psychiatric symptoms and her current life events. Morgan [name] shared that she was triggered on her last visit because of having to answer questions on the assessment and then having other issues. She shared that this led her to drink alcohol and harmful substances. Clinician asked if she was suicidal which she denied. She shared that she had begun another healing path prior to beginning treatment with clinician and she would like to follow that path. Clinician asked about the path "inner bonding" and Morgan [name] work with it. Clinician agreed to look at the info on [place] but also let Morgan [name] know that clinician was not trained in it. Morgan [name] became very tearful and upset when she explained that she was tired of starting over. Morgan [name] spoke vaguely about her frustrations and about what she expected therapy to look like. Clinician listened attentively. Clinician suggest that she start with skills. Morgan [name] shared she already knew the skills from [facility] classes. Clinician asked [name] to demonstrate a skill which she did and Morgan [name] appeared to calmed herself. Clinician asked if she were to go back how she might apply the tools to her emotions over the past few weeks. Morgan [name] denied any current suicidal or homicidal ideation. Clinician suggested that Morgan [name] practice her Cristela Blue Mind techniques until next session, Morgan [name] nodded.  Plan: Return again in 1-2 weeks.  Diagnosis: Axis I: Bipolar I  disorder and Borderline personality disorder    M[name] A, LCSW 01/15/2015

## 2015-01-22 ENCOUNTER — Ambulatory Visit (INDEPENDENT_AMBULATORY_CARE_PROVIDER_SITE_OTHER): Payer: Medicare Other | Admitting: Clinical

## 2015-01-22 ENCOUNTER — Encounter (HOSPITAL_COMMUNITY): Payer: Self-pay | Admitting: Clinical

## 2015-01-22 DIAGNOSIS — F319 Bipolar disorder, unspecified: Secondary | ICD-10-CM

## 2015-01-22 DIAGNOSIS — F603 Borderline personality disorder: Secondary | ICD-10-CM | POA: Diagnosis not present

## 2015-01-22 NOTE — Progress Notes (Signed)
   THERAPIST PROGRESS NOTE  Session Time: 10:00 - 10:57  Participation Level: Active  Behavioral Response: CasualAlertDepressed  Type of Therapy: Individual Therapy  Treatment Goals addressed: improve psychiatric symptoms, Improve unhelpful thinking patterns, self-care skills, and reduce unhealthy behaviors/increase healthy behaviors  Interventions: CBT and Motivational Interviewing, Grounding and Mindfulness Techniques  Summary: Morgan [name] is a 49 y.o. female who presents with  Suicidal/Homicidal: No -without intent/plan  Therapist Response: Morgan [name] met with clinician for an individual session. Morgan [name] discussed his psychiatric symptoms and her current life events. Morgan [name] shared about her desire to heal emotionally. She cried at the amount of work she has done to heal and yet feels like their is a lot more to do.She shared a reading that was meaningful to her. Client and clinician discussed the reading (about being open to change and challenges) and how it applied to her recovery process. Morgan [name] and clinician discussed knowledge and practice. Morgan [name] shared that she has a lot of knowledge about the therapeutic process, but that in the past she was unable to maintain a consistent  Practice. Morgan [name] shared her theories about why this was true. When asked Morgan [name] was able to identify ways she could improve the consistency of her practice. Clinician introduced grounding and mindfulness techniques. Clinician explained the process, purpose and practice. Morgan [name] and clinician discussed her past practice with mindfulness and grounding techniques. Morgan [name] and clinician discussed and practiced some of the techniques together. Morgan [name] agreed to practice the techniques daily until next session.    Plan: Return again in 1 weeks.  Diagnosis: Axis I:   Bipolar I disorder, most recent episode (or current) unspecified, Borderline Personality Disorder   Topanga Alvelo A, LCSW 01/22/2015

## 2015-01-29 ENCOUNTER — Ambulatory Visit (INDEPENDENT_AMBULATORY_CARE_PROVIDER_SITE_OTHER): Payer: Medicare Other | Admitting: Clinical

## 2015-01-29 ENCOUNTER — Encounter (HOSPITAL_COMMUNITY): Payer: Self-pay | Admitting: Clinical

## 2015-01-29 DIAGNOSIS — F603 Borderline personality disorder: Secondary | ICD-10-CM

## 2015-01-29 DIAGNOSIS — F319 Bipolar disorder, unspecified: Secondary | ICD-10-CM

## 2015-01-29 NOTE — Progress Notes (Signed)
   THERAPIST PROGRESS NOTE  Session Time: 10:02 - 10:58  Participation Level: Active  Behavioral Response: CasualAlertAnxious  Type of Therapy: Individual Therapy  Treatment Goals addressed: improve psychiatric symptoms, Improve unhelpful thinking patterns, self-care skills, and reduce unhealthy behaviors/increase healthy behaviors  Interventions: CBT and Motivational Interviewing, Grounding and Mindfulness Techniques  Summary: Morgan [name] is a 49 [name].o. female who presents with  Suicidal/Homicidal: No -without intent/plan  Therapist Response: Morgan [name] met with clinician for an individual session. Morgan [name] discussed his psychiatric symptoms, her current life events and her homework. She shared that she had been experiencing anxiety and depression because her and some other women have been out looking for an apartment to rent. She shared that she had not been working on her inner bonding work which she says is very important to her. She shared that she would like that to be the focus of therapy. Clinician explained that clinician was not trained in inner bonding, that clinician could be supportive of Morgan [name]'s work and discoveries in addition to teaching her other evidence  based practice skills, but could not facilitate a practice she is not trained in.  Client and clinician discussed her grounding and mindfulness homework. She shared that even though she initially felt resistant she found them to helpful. Morgan [name] stated that she even taught one of the techniques to a friend. Clinician introduced some basic CBT concepts to Morgan [name]. Again Morgan [name] was resistant to the idea of learning something different than Inner Bonding, however after discussing how it could be supportive and helpful she agreed to complete a packet. Client and clinician agreed to discuss how Morgan [name] could integrate her practices. Morgan [name] said she was please with that and agreed to do her homework prior to next session.    Plan:  Return again in 1 weeks.  Diagnosis: Axis I:   Bipolar I disorder, most recent episode (or current) unspecified, Borderline Personality Disorder    , A, LCSW 01/29/2015  

## 2015-02-05 ENCOUNTER — Encounter (HOSPITAL_COMMUNITY): Payer: Self-pay | Admitting: Clinical

## 2015-02-05 ENCOUNTER — Ambulatory Visit (INDEPENDENT_AMBULATORY_CARE_PROVIDER_SITE_OTHER): Payer: Medicare Other | Admitting: Clinical

## 2015-02-05 DIAGNOSIS — F603 Borderline personality disorder: Secondary | ICD-10-CM | POA: Diagnosis not present

## 2015-02-05 DIAGNOSIS — F319 Bipolar disorder, unspecified: Secondary | ICD-10-CM | POA: Diagnosis not present

## 2015-02-05 NOTE — Progress Notes (Signed)
   THERAPIST PROGRESS NOTE  Session Time: 10:00 - 10:57  Participation Level: Active  Behavioral Response: CasualAlertAnxious  Type of Therapy: Individual Therapy  Treatment Goals addressed: improve psychiatric symptoms, Improve unhelpful thinking patterns, reduce unhealthy behaviors/increase healthy behaviors  Interventions: CBT and Motivational Interviewing, Grounding and Mindfulness techniques  Summary: Morgan [name] is a 49 y.o. female who presents with  Suicidal/Homicidal: No -without intent/plan . [name] denied any self harming behaviors since last session  Therapist Response: Morgan [name] met with clinician for an individual session. Morgan [name] discussed his psychiatric symptoms, her current life events and her homework. She shared that she had been feeling good some days and anxious others. Morgan [name] and clinician reviewed and discussed her homework. Morgan [name] shared that she was glad that she had completed her homework because it gave her another way of looking at the issues she is working on. Morgan [name] shared some of her answers from the homework.  Morgan [name] shared that she had rented a room in an apartment and was happy that the roomates had accepted her. She shared that moving in with new people also brought anxiety. In sharing why Morgan [name] uncovered a core belief " I must sacrifice while no one else does" this belief was formed in childhood when Morgan [name] would do things for the other children to keep them all safe from their Fathers anger. Morgan [name] was tearful when she shared this. Client and clinician discussed that while this rule had a purpose early in life did it still serve her. Morgan [name] said it did not. Client and clinician discussed how [name] could challenge this belief and replace it with one that was more suitable to the life she wants and the woman she is today. [name] and clinician practiced a grounding technique together. [name] agreed to continue her homework until next session.  Morgan [name] shared she could feel herself struggling with wether or not she could give up the old belief. She shared she planned to process what was discussed.   Plan: Return again in 1 weeks.  Diagnosis: Axis I:   Bipolar I disorder, most recent episode (or current) unspecified, Borderline Personality Disorder   Morgan [name] A, LCSW 02/05/2015

## 2015-02-12 ENCOUNTER — Ambulatory Visit (INDEPENDENT_AMBULATORY_CARE_PROVIDER_SITE_OTHER): Payer: Medicare Other | Admitting: Clinical

## 2015-02-12 ENCOUNTER — Encounter (HOSPITAL_COMMUNITY): Payer: Self-pay | Admitting: Clinical

## 2015-02-12 DIAGNOSIS — F603 Borderline personality disorder: Secondary | ICD-10-CM | POA: Diagnosis not present

## 2015-02-12 DIAGNOSIS — F319 Bipolar disorder, unspecified: Secondary | ICD-10-CM

## 2015-02-12 NOTE — Progress Notes (Signed)
   THERAPIST PROGRESS NOTE  Session Time: 10:00 -10:58  Participation Level: Active  Behavioral Response: CasualAlertAnxious  Type of Therapy: Individual Therapy  Treatment Goals addressed: improve psychiatric symptoms, Improve unhelpful thinking patterns, self-care skills, and reduce unhealthy behaviors/increase healthy behaviors  Interventions: CBT and Motivational Interviewing, Grounding and Mindfulness Techniques  Summary: Morgan [name] is [name] 49 y.o. female who presents with  Suicidal/Homicidal: No -without intent/plan  Therapist Response: Morgan [name] met with clinician for an individual session. Morgan [name] discussed his psychiatric symptoms, her current life events and her homework. She shared that she had read the homework but did was having trouble understanding it. Client and clinician reviewed and discussed her packet. Client and clinician agreed to discuss the packet topics verbally in the future. Morgan [name] shared that she had walked 5 miles to the session as part of her self care. Clinician praised her and client and clinician discussed how exercise helps improve mental health and reduce stress. Morgan [name] shared that she had been practicing her grounding and mindfulness techniques. She shared that she is finding them so helpful that she has taught them to her friends. Morgan [name] shared that she had been working on "loving herself" and trying to practice healthy behaviors. She denied any recent self harming behaviors. Morgan [name] shared that it is easier for her to be kind to others than herself. Client and clinician discussed her thoughts and reasons for this. Morgan [name] and clinician discussed the evidence that does and does not support her negative self evaluations. Morgan [name] and clinician agreed to explore this topic further at [name] future sessions. Morgan [name] agreed to continue to practice her grounding techniques and to practice healthy behaviors until next session.  Plan: Return again in 1  weeks.  Diagnosis: Axis I:   Bipolar I disorder, most recent episode (or current) unspecified, Borderline Personality Disorder   Morgan Prigmore A, LCSW 02/12/2015

## 2015-02-20 ENCOUNTER — Ambulatory Visit (INDEPENDENT_AMBULATORY_CARE_PROVIDER_SITE_OTHER): Payer: Medicare Other | Admitting: Psychiatry

## 2015-02-20 VITALS — BP 119/80 | HR 93 | Ht 64.2 in | Wt 169.8 lb

## 2015-02-20 DIAGNOSIS — F603 Borderline personality disorder: Secondary | ICD-10-CM

## 2015-02-20 DIAGNOSIS — F331 Major depressive disorder, recurrent, moderate: Secondary | ICD-10-CM

## 2015-02-20 DIAGNOSIS — F3174 Bipolar disorder, in full remission, most recent episode manic: Secondary | ICD-10-CM

## 2015-02-20 MED ORDER — GABAPENTIN 100 MG PO CAPS: ORAL_CAPSULE | ORAL | Status: DC

## 2015-02-20 MED ORDER — LORAZEPAM 0.5 MG PO TABS: ORAL_TABLET | ORAL | Status: DC

## 2015-02-20 NOTE — Progress Notes (Signed)
[facility] MD Progress Note  02/20/2015 3:52 PM Morgan [name]  MRN:  [id number] Subjective: 180 better Principal Problem: Impulsivity Diagnosis; borderline personality disorder Today the patient says she feels much better. She has a good relationship with her therapist who she seeing weekly. The patient is not been self-destructive at all. She is not suicidal. She says she's been much less impulsive. I think this is a function of time. The patient continues taking Neurontin 100 mg 2 twice a day. The patient does ask. Some acute anxiety that happens now and then and she is taking some Ativan that she has left over takes it very rarely. The patient has a number of stressors mainly that with moving. Presently she's living in a [address] [place] moving in with 2 other women and her daughter in the next few weeks. The patient's excited looking forward to it. Nonetheless moving for this patient has for most is very anxiety provoking. 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 The patient recently is attempting to volunteer at the [facility]. Today the patient denies daily depression. She says she is distinctly better. She denies chronic consistent anxiety but does have bouts of intense anxiety. She denies the use of alcohol or drugs. The patient has a much improved sense of worth compared to when I saw her last time. The patient is complaining of some new physical problems mainly that of joint pain. She has an upcoming appointment with her primary care doctor in about 3 weeks. At that time she'll talk with this primary care doctor about some pain that she's having an headaches. Mainly her pain is joint pain. Overall emotionally patient is much better. I think the connection in therapy is the key. She also needs to get her thyroid medications restarted. Total Time spent with patient: 30 minutes   Past Medical History:  Past Medical History  Diagnosis Date  .  Asthma   . IBS (irritable bowel syndrome) 1995  . Hypothyroidism 2007    Developed after use of Lithium  . Arthritis   . Degenerative disorder of bone   . Bulging lumbar disc 07/19/05  . Bipolar disorder   . History of borderline personality disorder   . Anxiety     Past Surgical History  Procedure Laterality Date  . Cholecystectomy    . Tubal ligation    . Abdominal hysterectomy    . Shoulder open rotator cuff repair Right 03/2010  . Bunionectomy Right 06/2012   Family History:  Family History  Problem Relation Age of Onset  . Depression Mother   . Post-traumatic stress disorder Sister   . Alcohol abuse Brother   . Drug abuse Brother   . Post-traumatic stress disorder Brother    Social History:  History  Alcohol Use  . 1.2 oz/week  . 0 Standard drinks or equivalent, 2 Shots of liquor per week     History  Drug Use No    History   Social History  . Marital Status: Divorced    Spouse Name: N/A  . Number of Children: N/A  . Years of Education: N/A   Social History Main Topics  . Smoking status: Former Smoker    Quit date: 07/20/1980  . Smokeless tobacco: Never Used  . Alcohol Use: 1.2 oz/week    0 Standard drinks or equivalent, 2 Shots of liquor per week  . Drug Use: No  . Sexual Activity: Not Currently   Other Topics  Concern  . Not on file   Social History Narrative   Additional History:    Sleep: Good  Appetite:good   Assessment:   Musculoskeletal: Strength & Muscle Tone: within normal limits Gait & Station: normal Patient leans: Right   Psychiatric Specialty Exam: Physical Exam  ROS  Blood pressure 119/80, pulse 93, height 5' 4.2" (1.631 m), weight 169 lb 12.8 oz (77.021 kg).Body mass index is 28.95 kg/(m^2).  General Appearance: Negative  Eye Contact::  NA  Speech:  Clear and Coherent  Volume:  Normal  Mood:  Negative  Affect:  Congruent  Thought Process:  Coherent  Orientation:  Full (Time, Place, and Person)  Thought Content:   WDL  Suicidal Thoughts:  No  Homicidal Thoughts:  No  Memory:  NA  Judgement:  Good  Insight:  NA and Good  Psychomotor Activity:  Normal  Concentration:  Good  Recall:  Good  Fund of Knowledge:Good  Language: Good  Akathisia:  No  Handed:  Right  AIMS (if indicated):     Assets:  Desire for Improvement  ADL's:  Intact  Cognition: WNL  Sleep:        Current Medications: Current Outpatient Prescriptions  Medication Sig Dispense Refill  . albuterol (PROVENTIL) (5 MG/ML) 0.5% nebulizer solution 2.5 mg as needed.    . cetirizine (ZYRTEC) 10 MG tablet Take 10 mg by mouth daily.    Marland Kitchen gabapentin (NEURONTIN) 100 MG capsule 2  bid 120 capsule 4  . hyoscyamine (LEVSIN, ANASPAZ) 0.125 MG tablet Take 0.125 mg by mouth as needed.    Marland Kitchen levothyroxine (SYNTHROID, LEVOTHROID) 50 MCG tablet Take 50 mcg by mouth 1 day or 1 dose.    Marland Kitchen LORazepam (ATIVAN) 0.5 MG tablet 1  qday  prn 10 tablet 1   No current facility-administered medications for this visit.    Lab Results: No results found for this or any previous visit (from the past 48 hour(s)).  Physical Findings: AIMS:  , ,  ,  ,    CIWA:    COWS:     Treatment Plan Summary: This patient's problems includes impulsivity. This was self-destructive impulsivity and it is in fact better. I think it is through time in therapy that she is benefiting. She'll continue taking Neurontin 100 mg 2 twice a day. Her second problem is her physical complaints of joint pain and headaches. Today we'll go ahead and order a number of blood tests that'll be available for her primary care doctor when she sees this patient in about 3 weeks. This patient's third problem is that of acute anxiety of what has benefited in the past by a small infrequent dose of Ativan. In order to maintain a relationship with this patient I will prescribe Ativan 0.5 mg 1 when necessary give her only 10 pills. The patient will continue in one-to-one therapy in the setting. Today we spent  more than 50% of the time educating on the benefits of her medications. This patient to return to see me in 9 weeks.   Medical Decision Making:  Self-Limited or Minor (1)     Floyed Masoud IRVING 02/20/2015, 3:52 PM

## 2015-02-21 ENCOUNTER — Ambulatory Visit (INDEPENDENT_AMBULATORY_CARE_PROVIDER_SITE_OTHER): Payer: Medicare Other | Admitting: Clinical

## 2015-02-21 ENCOUNTER — Encounter (HOSPITAL_COMMUNITY): Payer: Self-pay | Admitting: Clinical

## 2015-02-21 DIAGNOSIS — F319 Bipolar disorder, unspecified: Secondary | ICD-10-CM

## 2015-02-21 DIAGNOSIS — F603 Borderline personality disorder: Secondary | ICD-10-CM | POA: Diagnosis not present

## 2015-02-21 LAB — CBC
HEMATOCRIT: 44.2 % (ref 36.0–46.0)
Hemoglobin: 15 g/dL (ref 12.0–15.0)
MCH: 28.7 pg (ref 26.0–34.0)
MCHC: 33.9 g/dL (ref 30.0–36.0)
MCV: 84.5 fL (ref 78.0–100.0)
MPV: 10.4 fL (ref 8.6–12.4)
Platelets: 294 10*3/uL (ref 150–400)
RBC: 5.23 MIL/uL — AB (ref 3.87–5.11)
RDW: 13.9 % (ref 11.5–15.5)
WBC: 9.5 10*3/uL (ref 4.0–10.5)

## 2015-02-21 LAB — TSH: TSH: 4.152 u[IU]/mL (ref 0.350–4.500)

## 2015-02-21 LAB — COMPREHENSIVE METABOLIC PANEL
ALK PHOS: 45 U/L (ref 33–130)
ALT: 12 U/L (ref 6–29)
AST: 20 U/L (ref 10–35)
Albumin: 4.2 g/dL (ref 3.6–5.1)
BILIRUBIN TOTAL: 0.4 mg/dL (ref 0.2–1.2)
BUN: 11 mg/dL (ref 7–25)
CALCIUM: 10.2 mg/dL (ref 8.6–10.4)
CO2: 25 mmol/L (ref 20–31)
Chloride: 105 mmol/L (ref 98–110)
Creat: 0.77 mg/dL (ref 0.50–1.05)
Glucose, Bld: 76 mg/dL (ref 65–99)
Potassium: 4.4 mmol/L (ref 3.5–5.3)
Sodium: 143 mmol/L (ref 135–146)
TOTAL PROTEIN: 6.8 g/dL (ref 6.1–8.1)

## 2015-02-21 LAB — SEDIMENTATION RATE: Sed Rate: 1 mm/hr (ref 0–20)

## 2015-02-21 NOTE — Progress Notes (Signed)
   THERAPIST PROGRESS NOTE  Session Time: 11:01 -11:58  Participation Level: Active  Behavioral Response: CasualAlertDepressed   Type of Therapy: Individual Therapy  Treatment Goals addressed: improve psychiatric symptoms, Improve unhelpful thinking patterns, self-care skills,  Interventions: CBT and Motivational Interviewing, Grounding and Mindfulness Techniques  Summary: Shameka Aggarwal is a 49 y.o. female who presents with  Suicidal/Homicidal: No -without intent/plan  Therapist Response: Shalyn met with clinician for an individual session. Koralynn discussed his psychiatric symptoms, her current life events and her homework.  Mirtie shared that she is finding her mindfulness and grounding helpful. She shared one that she likes to practice and demonstrated it to clinician . Quanta and clinician reviewed some cbt information. Yazmen shared that she still has troubles trusting because of her the way her marriage ended (about 10 years ago) client and clinician discussed some of the positives ways she has grown both through the marriage and because the marriage ended. Client and clinician discussed her self trust. Shaela shared her insight that she was and is becoming more capable of having and maintaining healthy relationships. Client and clinician discussed self care. Aury shared she continues to walk and to challenge her negative automatic thoughts. [place] agreed to continue her homework until next session  Plan: Return again in 1 weeks.  Diagnosis: Axis I:   Bipolar I disorder, most recent episode (or current) unspecified, Borderline Personality Disorder   Tymir Terral A, LCSW 02/21/2015 he

## 2015-02-22 LAB — ANA: Anti Nuclear Antibody(ANA): NEGATIVE

## 2015-02-28 ENCOUNTER — Encounter (HOSPITAL_COMMUNITY): Payer: Self-pay | Admitting: Emergency Medicine

## 2015-02-28 ENCOUNTER — Ambulatory Visit (INDEPENDENT_AMBULATORY_CARE_PROVIDER_SITE_OTHER): Payer: Medicare Other | Admitting: Clinical

## 2015-02-28 ENCOUNTER — Emergency Department (HOSPITAL_COMMUNITY): Payer: Medicare Other

## 2015-02-28 ENCOUNTER — Encounter (HOSPITAL_COMMUNITY): Payer: Self-pay | Admitting: Clinical

## 2015-02-28 ENCOUNTER — Emergency Department (HOSPITAL_COMMUNITY)
Admission: EM | Admit: 2015-02-28 | Discharge: 2015-02-28 | Disposition: A | Discharge status: Home / Self Care | Payer: Medicare Other | Attending: Emergency Medicine | Admitting: Emergency Medicine

## 2015-02-28 DIAGNOSIS — Z91040 Latex allergy status: Secondary | ICD-10-CM | POA: Insufficient documentation

## 2015-02-28 DIAGNOSIS — R42 Dizziness and giddiness: Secondary | ICD-10-CM | POA: Diagnosis not present

## 2015-02-28 DIAGNOSIS — Z79899 Other long term (current) drug therapy: Secondary | ICD-10-CM | POA: Insufficient documentation

## 2015-02-28 DIAGNOSIS — M1990 Unspecified osteoarthritis, unspecified site: Secondary | ICD-10-CM | POA: Diagnosis not present

## 2015-02-28 DIAGNOSIS — M542 Cervicalgia: Secondary | ICD-10-CM | POA: Diagnosis not present

## 2015-02-28 DIAGNOSIS — R079 Chest pain, unspecified: Secondary | ICD-10-CM

## 2015-02-28 DIAGNOSIS — F603 Borderline personality disorder: Secondary | ICD-10-CM

## 2015-02-28 DIAGNOSIS — M25562 Pain in left knee: Secondary | ICD-10-CM | POA: Diagnosis not present

## 2015-02-28 DIAGNOSIS — Z8719 Personal history of other diseases of the digestive system: Secondary | ICD-10-CM | POA: Diagnosis not present

## 2015-02-28 DIAGNOSIS — E039 Hypothyroidism, unspecified: Secondary | ICD-10-CM | POA: Diagnosis not present

## 2015-02-28 DIAGNOSIS — F419 Anxiety disorder, unspecified: Secondary | ICD-10-CM | POA: Diagnosis not present

## 2015-02-28 DIAGNOSIS — R404 Transient alteration of awareness: Secondary | ICD-10-CM | POA: Diagnosis not present

## 2015-02-28 DIAGNOSIS — F319 Bipolar disorder, unspecified: Secondary | ICD-10-CM

## 2015-02-28 DIAGNOSIS — Z87891 Personal history of nicotine dependence: Secondary | ICD-10-CM | POA: Insufficient documentation

## 2015-02-28 DIAGNOSIS — Z880 Allergy status to penicillin: Secondary | ICD-10-CM | POA: Diagnosis not present

## 2015-02-28 DIAGNOSIS — Z3202 Encounter for pregnancy test, result negative: Secondary | ICD-10-CM | POA: Diagnosis not present

## 2015-02-28 DIAGNOSIS — M25561 Pain in right knee: Secondary | ICD-10-CM | POA: Insufficient documentation

## 2015-02-28 DIAGNOSIS — J45901 Unspecified asthma with (acute) exacerbation: Secondary | ICD-10-CM | POA: Insufficient documentation

## 2015-02-28 DIAGNOSIS — R0602 Shortness of breath: Secondary | ICD-10-CM | POA: Diagnosis not present

## 2015-02-28 LAB — CBC WITH DIFFERENTIAL/PLATELET
Basophils Absolute: 0.1 10*3/uL (ref 0.0–0.1)
Basophils Relative: 1 % (ref 0–1)
Eosinophils Absolute: 0.3 10*3/uL (ref 0.0–0.7)
Eosinophils Relative: 3 % (ref 0–5)
HCT: 45.7 % (ref 36.0–46.0)
HEMOGLOBIN: 15.2 g/dL — AB (ref 12.0–15.0)
LYMPHS ABS: 5.5 10*3/uL — AB (ref 0.7–4.0)
LYMPHS PCT: 51 % — AB (ref 12–46)
MCH: 28.8 pg (ref 26.0–34.0)
MCHC: 33.3 g/dL (ref 30.0–36.0)
MCV: 86.6 fL (ref 78.0–100.0)
MONOS PCT: 4 % (ref 3–12)
Monocytes Absolute: 0.4 10*3/uL (ref 0.1–1.0)
NEUTROS ABS: 4.3 10*3/uL (ref 1.7–7.7)
NEUTROS PCT: 41 % — AB (ref 43–77)
PLATELETS: 262 10*3/uL (ref 150–400)
RBC: 5.28 MIL/uL — AB (ref 3.87–5.11)
RDW: 13.2 % (ref 11.5–15.5)
WBC: 10.6 10*3/uL — AB (ref 4.0–10.5)

## 2015-02-28 LAB — URINALYSIS, ROUTINE W REFLEX MICROSCOPIC
BILIRUBIN URINE: NEGATIVE
Glucose, UA: NEGATIVE mg/dL
Hgb urine dipstick: NEGATIVE
KETONES UR: NEGATIVE mg/dL
LEUKOCYTES UA: NEGATIVE
NITRITE: NEGATIVE
Protein, ur: NEGATIVE mg/dL
SPECIFIC GRAVITY, URINE: 1.006 (ref 1.005–1.030)
UROBILINOGEN UA: 0.2 mg/dL (ref 0.0–1.0)
pH: 7 (ref 5.0–8.0)

## 2015-02-28 LAB — BASIC METABOLIC PANEL
Anion gap: 10 (ref 5–15)
BUN: 14 mg/dL (ref 6–20)
CALCIUM: 10.2 mg/dL (ref 8.9–10.3)
CO2: 24 mmol/L (ref 22–32)
Chloride: 106 mmol/L (ref 101–111)
Creatinine, Ser: 0.95 mg/dL (ref 0.44–1.00)
GFR calc Af Amer: >60 mL/min (ref >60)
GFR calc non Af Amer: >60 mL/min (ref >60)
GLUCOSE: 93 mg/dL (ref 65–99)
POTASSIUM: 4 mmol/L (ref 3.5–5.1)
SODIUM: 140 mmol/L (ref 135–145)

## 2015-02-28 LAB — D-DIMER, QUANTITATIVE (NOT AT ARMC)

## 2015-02-28 LAB — I-STAT TROPONIN, ED: Troponin i, poc: 0 ng/mL (ref 0.00–0.08)

## 2015-02-28 LAB — PREGNANCY, URINE: Preg Test, Ur: NEGATIVE

## 2015-02-28 MED ORDER — SODIUM CHLORIDE 0.9 % IV BOLUS (SEPSIS)
1000.0000 mL | Freq: Once | INTRAVENOUS | Status: AC
  Administered 2015-02-28: 1000 mL via INTRAVENOUS

## 2015-02-28 MED ORDER — LORAZEPAM 1 MG PO TABS: 1.0000 mg | ORAL_TABLET | Freq: Once | ORAL | Status: DC

## 2015-02-28 MED ORDER — NAPROXEN 250 MG PO TABS: 250.0000 mg | ORAL_TABLET | Freq: Two times a day (BID) | ORAL | Status: DC

## 2015-02-28 MED ORDER — ACETAMINOPHEN 325 MG PO TABS
650.0000 mg | ORAL_TABLET | Freq: Once | ORAL | Status: AC
Start: 2015-02-28 — End: 2015-02-28
  Administered 2015-02-28: 650 mg via ORAL
  Filled 2015-02-28: qty 2

## 2015-02-28 MED ORDER — DIAZEPAM 5 MG PO TABS
5.0000 mg | ORAL_TABLET | Freq: Once | ORAL | Status: AC
  Administered 2015-02-28: 5 mg via ORAL
  Filled 2015-02-28: qty 1

## 2015-02-28 NOTE — Progress Notes (Signed)
   THERAPIST PROGRESS NOTE  Session Time: 10:02 - 10:55  Participation Level: Active  Behavioral Response: CasualConfusedDepressed and worried  Type of Therapy: Individual Therapy  Treatment Goals addressed: improve psychiatric symptoms, Improve unhelpful thinking patterns, self-care skills, and reduce unhealthy behaviors/increase healthy behaviors  Interventions: CBT and Motivational Interviewing, Grounding and Mindfulness Techniques  Summary: Morgan [name] is a 49 y.o. female who presents with  Suicidal/Homicidal: No -without intent/plan  Therapist Response: Morgan [name] met with clinician for an individual session. Morgan [name] discussed his psychiatric symptoms, her current life events and her homework. She shared that she was all moved in. She shared about cleaning and about her fear of spiders. Morgan [name] shared that she had some anxiety about the transition. She was having trouble sharing because she became faint. Clinician retrieved the nurse who checked her vitals. She was fine and both agreed it was probably do to anxiety. Nurse encouraged her not to leave if she did not feel better. Client and clinician then practiced some grounding techniques. Morgan [name] shared about some of her efforts to do self care at home. She shared that the young women in her new apartment see her as a maternal figure which makes her feel good. Morgan [name] again began to feel flushed. Clinician escorted her to lobby. Staff was notified. Morgan [name] stayed for a bit. She was asked if she would like to go to the emergency room and she did and she was taken there.  Plan: Return again in 1 weeks.  Diagnosis: Axis I:   Bipolar I disorder, most recent episode (or current) unspecified, Borderline Personality Disorder   Morgan [name] A, LCSW 02/28/2015

## 2015-02-28 NOTE — Discharge Instructions (Signed)
Chest Pain (Nonspecific) °It is often hard to give a specific diagnosis for the cause of chest pain. There is always a chance that your pain could be related to something serious, such as a heart attack or a blood clot in the lungs. You need to follow up with your health care provider for further evaluation. °CAUSES  °· Heartburn. °· Pneumonia or bronchitis. °· Anxiety or stress. °· Inflammation around your heart (pericarditis) or lung (pleuritis or pleurisy). °· A blood clot in the lung. °· A collapsed lung (pneumothorax). It can develop suddenly on its own (spontaneous pneumothorax) or from trauma to the chest. °· Shingles infection (herpes zoster virus). °The chest wall is composed of bones, muscles, and cartilage. Any of these can be the source of the pain. °· The bones can be bruised by injury. °· The muscles or cartilage can be strained by coughing or overwork. °· The cartilage can be affected by inflammation and become sore (costochondritis). °DIAGNOSIS  °Lab tests or other studies may be needed to find the cause of your pain. Your health care provider may have you take a test called an ambulatory electrocardiogram (ECG). An ECG records your heartbeat patterns over a 24-hour period. You may also have other tests, such as: °· Transthoracic echocardiogram (TTE). During echocardiography, sound waves are used to evaluate how blood flows through your heart. °· Transesophageal echocardiogram (TEE). °· Cardiac monitoring. This allows your health care provider to monitor your heart rate and rhythm in real time. °· Holter monitor. This is a portable device that records your heartbeat and can help diagnose heart arrhythmias. It allows your health care provider to track your heart activity for several days, if needed. °· Stress tests by exercise or by giving medicine that makes the heart beat faster. °TREATMENT  °· Treatment depends on what may be causing your chest pain. Treatment may include: °¨ Acid blockers for  heartburn. °¨ Anti-inflammatory medicine. °¨ Pain medicine for inflammatory conditions. °¨ Antibiotics if an infection is present. °· You may be advised to change lifestyle habits. This includes stopping smoking and avoiding alcohol, caffeine, and chocolate. °· You may be advised to keep your head raised (elevated) when sleeping. This reduces the chance of acid going backward from your stomach into your esophagus. °Most of the time, nonspecific chest pain will improve within 2-3 days with rest and mild pain medicine.  °HOME CARE INSTRUCTIONS  °· If antibiotics were prescribed, take them as directed. Finish them even if you start to feel better. °· For the next few days, avoid physical activities that bring on chest pain. Continue physical activities as directed. °· Do not use any tobacco products, including cigarettes, chewing tobacco, or electronic cigarettes. °· Avoid drinking alcohol. °· Only take medicine as directed by your health care provider. °· Follow your health care provider's suggestions for further testing if your chest pain does not go away. °· Keep any follow-up appointments you made. If you do not go to an appointment, you could develop lasting (chronic) problems with pain. If there is any problem keeping an appointment, call to reschedule. °SEEK MEDICAL CARE IF:  °· Your chest pain does not go away, even after treatment. °· You have a rash with blisters on your chest. °· You have a fever. °SEEK IMMEDIATE MEDICAL CARE IF:  °· You have increased chest pain or pain that spreads to your arm, neck, jaw, back, or abdomen. °· You have shortness of breath. °· You have an increasing cough, or you cough   up blood.  You have severe back or abdominal pain.  You feel nauseous or vomit.  You have severe weakness.  You faint.  You have chills. This is an emergency. Do not wait to see if the pain will go away. Get medical help at once. Call your local emergency services (911 in [place] Do not drive  yourself to the hospital. MAKE SURE YOU:   Understand these instructions.  Will watch your condition.  Will get help right away if you are not doing well or get worse. Document Released: 04/15/2005 Document Revised: 07/11/2013 Document Reviewed: 02/09/2008 [facility] Patient Information 2015 [place], [place]. This information is not intended to replace advice given to you by your health care provider. Make sure you discuss any questions you have with your health care provider. Generalized Anxiety Disorder Generalized anxiety disorder (GAD) is a mental disorder. It interferes with life functions, including relationships, work, and school. GAD is different from normal anxiety, which everyone experiences at some point in their lives in response to specific life events and activities. Normal anxiety actually helps [place] prepare for and get through these life events and activities. Normal anxiety goes away after the event or activity is over.  GAD causes anxiety that is not necessarily related to specific events or activities. It also causes excess anxiety in proportion to specific events or activities. The anxiety associated with GAD is also difficult to control. GAD can vary from mild to severe. People with severe GAD can have intense waves of anxiety with physical symptoms (panic attacks).  SYMPTOMS The anxiety and worry associated with GAD are difficult to control. This anxiety and worry are related to many life events and activities and also occur more days than not for 6 months or longer. People with GAD also have three or more of the following symptoms (one or more in children):  Restlessness.   Fatigue.  Difficulty concentrating.   Irritability.  Muscle tension.  Difficulty sleeping or unsatisfying sleep. DIAGNOSIS GAD is diagnosed through an assessment by your health care provider. Your health care provider will ask you questions aboutyour mood,physical symptoms, and events in your  life. Your health care provider may ask you about your medical history and use of alcohol or drugs, including prescription medicines. Your health care provider may also do a physical exam and blood tests. Certain medical conditions and the use of certain substances can cause symptoms similar to those associated with GAD. Your health care provider may refer you to a mental health specialist for further evaluation. TREATMENT The following therapies are usually used to treat GAD:   Medication. Antidepressant medication usually is prescribed for long-term daily control. Antianxiety medicines may be added in severe cases, especially when panic attacks occur.   Talk therapy (psychotherapy). Certain types of talk therapy can be helpful in treating GAD by providing support, education, and guidance. A form of talk therapy called cognitive behavioral therapy can teach you healthy ways to think about and react to daily life events and activities.  Stress managementtechniques. These include yoga, meditation, and exercise and can be very helpful when they are practiced regularly. A mental health specialist can help determine which treatment is best for you. Some people see improvement with one therapy. However, other people require a combination of therapies. Document Released: 10/31/2012 Document Revised: 11/20/2013 Document Reviewed: 10/31/2012 [facility] Patient Information 2015 [place], [place]. This information is not intended to replace advice given to you by your health care provider. Make sure you discuss any questions you  have with your health care provider. Knee Pain The knee is the complex joint between your thigh and your lower leg. It is made up of bones, tendons, ligaments, and cartilage. The bones that make up the knee are:  The femur in the thigh.  The tibia and fibula in the lower leg.  The patella or kneecap riding in the groove on the lower femur. CAUSES  Knee pain is a common complaint  with many causes. A few of these causes are:  Injury, such as:  A ruptured ligament or tendon injury.  Torn cartilage.  Medical conditions, such as:  Gout  Arthritis  Infections  Overuse, over training, or overdoing a physical activity. Knee pain can be minor or severe. Knee pain can accompany debilitating injury. Minor knee problems often respond well to self-care measures or get well on their own. More serious injuries may need medical intervention or even surgery. SYMPTOMS The knee is complex. Symptoms of knee problems can vary widely. Some of the problems are:  Pain with movement and weight bearing.  Swelling and tenderness.  Buckling of the knee.  Inability to straighten or extend your knee.  Your knee locks and you cannot straighten it.  Warmth and redness with pain and fever.  Deformity or dislocation of the kneecap. DIAGNOSIS  Determining what is wrong may be very straight forward such as when there is an injury. It can also be challenging because of the complexity of the knee. Tests to make a diagnosis may include:  Your caregiver taking a history and doing a physical exam.  Routine X-rays can be used to rule out other problems. X-rays will not reveal a cartilage tear. Some injuries of the knee can be diagnosed by:  Arthroscopy a surgical technique by which a small video camera is inserted through tiny incisions on the sides of the knee. This procedure is used to examine and repair internal knee joint problems. Tiny instruments can be used during arthroscopy to repair the torn knee cartilage (meniscus).  Arthrography is a radiology technique. A contrast liquid is directly injected into the knee joint. Internal structures of the knee joint then become visible on X-ray film.  An MRI scan is a non X-ray radiology procedure in which magnetic fields and a computer produce two- or three-dimensional images of the inside of the knee. Cartilage tears are often visible  using an MRI scanner. MRI scans have largely replaced arthrography in diagnosing cartilage tears of the knee.  Blood work.  Examination of the fluid that helps to lubricate the knee joint (synovial fluid). This is done by taking a sample out using a needle and a syringe. TREATMENT The treatment of knee problems depends on the cause. Some of these treatments are:  Depending on the injury, proper casting, splinting, surgery, or physical therapy care will be needed.  Give yourself adequate recovery time. Do not overuse your joints. If you begin to get sore during workout routines, back off. Slow down or do fewer repetitions.  For repetitive activities such as cycling or running, maintain your strength and nutrition.  Alternate muscle groups. For example, if you are a weight lifter, work the upper body on one day and the lower body the next.  Either tight or weak muscles do not give the proper support for your knee. Tight or weak muscles do not absorb the stress placed on the knee joint. Keep the muscles surrounding the knee strong.  Take care of mechanical problems.  If you have flat  feet, orthotics or special shoes may help. See your caregiver if you need help.  Arch supports, sometimes with wedges on the inner or outer aspect of the heel, can help. These can shift pressure away from the side of the knee most bothered by osteoarthritis.  A brace called an "unloader" brace also may be used to help ease the pressure on the most arthritic side of the knee.  If your caregiver has prescribed crutches, braces, wraps or ice, use as directed. The acronym for this is PRICE. This means protection, rest, ice, compression, and elevation.  Nonsteroidal anti-inflammatory drugs (NSAIDs), can help relieve pain. But if taken immediately after an injury, they may actually increase swelling. Take NSAIDs with food in your stomach. Stop them if you develop stomach problems. Do not take these if you have a  history of ulcers, stomach pain, or bleeding from the bowel. Do not take without your caregiver's approval if you have problems with fluid retention, heart failure, or kidney problems.  For ongoing knee problems, physical therapy may be helpful.  Glucosamine and chondroitin are over-the-counter dietary supplements. Both may help relieve the pain of osteoarthritis in the knee. These medicines are different from the usual anti-inflammatory drugs. Glucosamine may decrease the rate of cartilage destruction.  Injections of a corticosteroid drug into your knee joint may help reduce the symptoms of an arthritis flare-up. They may provide pain relief that lasts a few months. You may have to wait a few months between injections. The injections do have a small increased risk of infection, water retention, and elevated blood sugar levels.  Hyaluronic acid injected into damaged joints may ease pain and provide lubrication. These injections may work by reducing inflammation. A series of shots may give relief for as long as 6 months.  Topical painkillers. Applying certain ointments to your skin may help relieve the pain and stiffness of osteoarthritis. Ask your pharmacist for suggestions. Many over the-counter products are approved for temporary relief of arthritis pain.  In some countries, doctors often prescribe topical NSAIDs for relief of chronic conditions such as arthritis and tendinitis. A review of treatment with NSAID creams found that they worked as well as oral medications but without the serious side effects. PREVENTION  Maintain a healthy weight. Extra pounds put more strain on your joints.  Get strong, stay limber. Weak muscles are a common cause of knee injuries. Stretching is important. Include flexibility exercises in your workouts.  Be smart about exercise. If you have osteoarthritis, chronic knee pain or recurring injuries, you may need to change the way you exercise. This does not mean you  have to stop being active. If your knees ache after jogging or playing basketball, consider switching to swimming, water aerobics, or other low-impact activities, at least for a few days a week. Sometimes limiting high-impact activities will provide relief.  Make sure your shoes fit well. Choose footwear that is right for your sport.  Protect your knees. Use the proper gear for knee-sensitive activities. Use kneepads when playing volleyball or laying carpet. Buckle your seat belt every time you drive. Most shattered kneecaps occur in car accidents.  Rest when you are tired. SEEK MEDICAL CARE IF:  You have knee pain that is continual and does not seem to be getting better.  SEEK IMMEDIATE MEDICAL CARE IF:  Your knee joint feels hot to the touch and you have a high fever. MAKE SURE YOU:   Understand these instructions.  Will watch your condition.  Will get  help right away if you are not doing well or get worse. Document Released: 05/03/2007 Document Revised: 09/28/2011 Document Reviewed: 05/03/2007 [facility] Patient Information 2015 [place], [place]. This information is not intended to replace advice given to you by your health care provider. Make sure you discuss any questions you have with your health care provider.

## 2015-02-28 NOTE — ED Notes (Signed)
Delay in lab draw, pt currently in exay

## 2015-02-28 NOTE — ED Notes (Signed)
Per EMS # 10- Called to [facility] for pt c/o anxiety and dizziness. Pt stated that she became dizzy while ambulating.Also c/o ear pain, knee pain, jaw pain. VS stable. pt is alert, oriented and appropriate

## 2015-02-28 NOTE — ED Provider Notes (Signed)
CSN: [id number]     Arrival date & time 02/28/15  1211 History   First MD Initiated Contact with Patient 02/28/15 1253     Chief Complaint  Patient presents with  . Anxiety    recurrent  . Dizziness    recurrent lightheadedness after walking 5 miles   Morgan [name] is a 49 y.o. female With a history of bipolar disorder, arthritis, irritable bowel syndrome and anxiety who presents to the emergency department complaining of anxiety and dizziness which started earlier today while she was at a behavioral health meeting. Patient reports that she was sitting in her behavioral health meeting when she started having chest pain, shortness of breath, and dizziness. Patient reports feeling like she might pass out. Upon my initial evaluation she denies chest pain or shortness of breath currently. We talked about whether this might of been anxiety but she is unsure. She initially was in fast track and the patient was admitted to acute care. I continued her care acute care setting. Patient also complains of pain behind both of her knees ongoing for the past 3 weeks. Patient reports that she walked 5 miles to her behavioral health meeting today. Currently the patient reports just feeling uncomfortable. She reports she has some chronic pain in her neck that is unchanged. She also reports the pain in her back of her knees is also irritating. The patient denies any current chest pain, shortness of breath or palpitations. The patient denies fevers, chills,loss of consciousness, weakness, fevers, recent illness, numbness, tingling, dysuria, rashes, neck stiffness, headaches, leg swelling.  She denies SI or HI.   (Consider location/radiation/quality/duration/timing/severity/associated sxs/prior Treatment) HPI  Past Medical History  Diagnosis Date  . Asthma   . IBS (irritable bowel syndrome) 1995  . Hypothyroidism 2007    Developed after use of Lithium  . Arthritis   . Degenerative disorder of bone   . Bulging  lumbar disc 07/19/05  . Bipolar disorder   . History of borderline personality disorder   . Anxiety    Past Surgical History  Procedure Laterality Date  . Cholecystectomy    . Tubal ligation    . Abdominal hysterectomy    . Shoulder open rotator cuff repair Right 03/2010  . Bunionectomy Right 06/2012   Family History  Problem Relation Age of Onset  . Depression Mother   . Hypertension Mother   . Post-traumatic stress disorder Sister   . Alcohol abuse Brother   . Drug abuse Brother   . Post-traumatic stress disorder Brother    Social History  Substance Use Topics  . Smoking status: Former Smoker    Quit date: 07/20/1980  . Smokeless tobacco: Never Used  . Alcohol Use: 0.0 oz/week    0 Standard drinks or equivalent per week     Comment: occ   OB History    No data available     Review of Systems  Constitutional: Negative for fever and chills.  HENT: Negative for congestion and sore throat.   Eyes: Negative for visual disturbance.  Respiratory: Positive for shortness of breath. Negative for cough and wheezing.   Cardiovascular: Positive for chest pain. Negative for palpitations and leg swelling.  Gastrointestinal: Negative for nausea, vomiting, abdominal pain and diarrhea.  Genitourinary: Negative for dysuria.  Musculoskeletal: Positive for arthralgias and neck pain. Negative for back pain, gait problem and neck stiffness.  Skin: Negative for rash.  Neurological: Positive for light-headedness. Negative for dizziness, syncope, weakness, numbness and headaches.  Psychiatric/Behavioral: Negative for suicidal  ideas.      Allergies  Abilify; Ciprofloxacin; Lamictal; Amoxicillin; Doxycycline; Latex; Meloxicam; Mirapex; Risperdal; and Sulfa antibiotics  Home Medications   Prior to Admission medications   Medication Sig Start Date End Date Taking? Authorizing Provider  albuterol (PROVENTIL) (5 MG/ML) 0.5% nebulizer solution Take 2.5 mg by nebulization every 6 (six)  hours as needed for wheezing or shortness of breath.    Yes Historical Provider, MD  cetirizine (ZYRTEC) 10 MG tablet Take 10 mg by mouth daily.   Yes Historical Provider, MD  gabapentin (NEURONTIN) 100 MG capsule 2  bid Patient taking differently: Take 200 mg by mouth 2 (two) times daily.  02/20/15  Yes Norma Fredrickson, MD  hyoscyamine (LEVSIN, ANASPAZ) 0.125 MG tablet Take 0.125 mg by mouth as needed for cramping.    Yes Historical Provider, MD  ibuprofen (ADVIL,MOTRIN) 100 MG tablet Take 400-800 mg by mouth every 6 (six) hours as needed for pain.   Yes Historical Provider, MD  levothyroxine (SYNTHROID, LEVOTHROID) 50 MCG tablet Take 50 mcg by mouth daily.    Yes Historical Provider, MD  LORazepam (ATIVAN) 0.5 MG tablet 1  qday  prn Patient taking differently: Take 0.5 mg by mouth daily as needed for anxiety.  02/20/15  Yes Norma Fredrickson, MD  naproxen (NAPROSYN) 250 MG tablet Take 1 tablet (250 mg total) by mouth 2 (two) times daily with a meal. 02/28/15   Waynetta Pean, PA-C   BP 121/75 mmHg  Pulse 62  Temp(Src) 98.9 F (37.2 C) (Oral)  Resp 18  Wt 169 lb (76.658 kg)  SpO2 99% Physical Exam  Constitutional: She is oriented to person, place, and time. She appears well-developed and well-nourished. No distress.  Nontoxic appearing.  HENT:  Head: Normocephalic and atraumatic.  Right Ear: External ear normal.  Left Ear: External ear normal.  Mouth/Throat: Oropharynx is clear and moist.  Eyes: Conjunctivae are normal. Pupils are equal, round, and reactive to light. Right eye exhibits no discharge. Left eye exhibits no discharge.  Neck: Normal range of motion. Neck supple. No JVD present. No tracheal deviation present.   Patient has full range of motion of her neck.  Cardiovascular: Normal rate, regular rhythm, normal heart sounds and intact distal pulses.  Exam reveals no gallop and no friction rub.   No murmur heard. Bilateral radial, posterior tibialis and dorsalis pedis pulses are intact.     Pulmonary/Chest: Effort normal and breath sounds normal. No respiratory distress. She has no wheezes. She has no rales. She exhibits no tenderness.  Lungs are clear to auscultation bilaterally. No chest wall tenderness.  Abdominal: Soft. Bowel sounds are normal. She exhibits no distension. There is no tenderness. There is no guarding.  Musculoskeletal: She exhibits no edema.  No foot or pedal edema or tenderness. No knee edema or tenderness. No posterior knee edema or tenderness. No evidence of a bakers cyst. Patient is able to ambulate without difficulty or assistance and with normal gait.   Lymphadenopathy:    She has no cervical adenopathy.  Neurological: She is alert and oriented to person, place, and time. Coordination normal.  Patient's sensation is intact in her bilateral upper and lower extremities.  Skin: Skin is warm and dry. No rash noted. She is not diaphoretic. No erythema. No pallor.  Psychiatric: Her speech is normal and behavior is normal. Her mood appears anxious. She is not actively hallucinating. She does not exhibit a depressed mood. She expresses no homicidal and no suicidal ideation.  Patient appears very anxious.  She denies SI or  HI.   Nursing note and vitals reviewed.   ED Course  Procedures (including critical care time) Labs Review Labs Reviewed  CBC WITH DIFFERENTIAL/PLATELET - Abnormal; Notable for the following:    WBC 10.6 (*)    RBC 5.28 (*)    Hemoglobin 15.2 (*)    Neutrophils Relative % 41 (*)    Lymphocytes Relative 51 (*)    Lymphs Abs 5.5 (*)    All other components within normal limits  BASIC METABOLIC PANEL  URINALYSIS, ROUTINE W REFLEX MICROSCOPIC (NOT AT [facility])  D-DIMER, QUANTITATIVE (NOT AT [facility])  PREGNANCY, URINE  I-STAT TROPOININ, ED  POC URINE PREG, ED    Imaging Review Dg Chest 2 View  02/28/2015   CLINICAL DATA:  Chest pain.  Shortness of breath .  EXAM: CHEST  2 VIEW  COMPARISON:  None.  FINDINGS: No acute cardiopulmonary  disease. Heart size normal. No pleural effusion or pneumothorax. Surgical clips right upper quadrant.  IMPRESSION: No acute cardiopulmonary disease.   Electronically Signed   By: Marcello Moores  Register   On: 02/28/2015 13:57     EKG Interpretation   Date/Time:  Thursday February 28 2015 13:50:32 EDT Ventricular Rate:  58 PR Interval:  129 QRS Duration: 92 QT Interval:  418 QTC Calculation: 410 R Axis:   33 Text Interpretation:  Sinus rhythm Low voltage, precordial leads Confirmed  by Hazle Coca [phone]) on 02/28/2015 2:43:48 PM      Filed Vitals:   02/28/15 1218 02/28/15 1228 02/28/15 1233 02/28/15 1357  BP: 141/66  121/74 121/75  Pulse: 65  76 62  Temp: 98.2 F (36.8 C)  98.4 F (36.9 C) 98.9 F (37.2 C)  TempSrc: Oral  Oral Oral  Resp: 18  20 18   Weight:   169 lb (76.658 kg)   SpO2: 99% 99% 100% 99%     MDM   Meds given in ED:  Medications  sodium chloride 0.9 % bolus 1,000 mL (1,000 mLs Intravenous New Bag/Given 02/28/15 1414)  acetaminophen (TYLENOL) tablet 650 mg (650 mg Oral Given 02/28/15 1415)  diazepam (VALIUM) tablet 5 mg (5 mg Oral Given 02/28/15 1455)    New Prescriptions   NAPROXEN (NAPROSYN) 250 MG TABLET    Take 1 tablet (250 mg total) by mouth 2 (two) times daily with a meal.    Final diagnoses:  Chest pain, unspecified chest pain type  Anxiety  Bilateral knee pain  Intermittent lightheadedness   This is a 49 y.o. female with a history of bipolar disorder, arthritis, irritable bowel syndrome and anxiety who presents to the emergency department complaining of anxiety and dizziness which started earlier today while she was at a behavioral health meeting. She also reports having an episode of chest pain and shortness of breath which lasted approximately 1 hour while sitting in her behavioral health meeting. At the time of my evaluation she denies any chest pain or shortness of breath. Patient also complained of bilateral posterior knee pain. On exam the patient is  afebrile nontoxic appearing. The patient appears very anxious. She denies SI or HI. Her lungs were clear auscultation bilaterally. She has no lower extremity edema or tenderness. I'm unable to appreciate any knee tenderness or elicit any knee pain. Patient has no posterior or anterior knee edema. There is no evidence of any Baker's cyst. She is able ambulate with normal gait. She has no calf edema or tenderness. Patient's EKG indicated sinus rhythm. She has a negative troponin. BMP  is within normal limits. CBC shows a white count of 10.6.D-dimer is negative. Urinalysis is unremarkable. She has a negative urine pregnancy test. The patient is not orthostatic. Chest x-ray is unremarkable. At reevaluation the patient reports she is still chest pain free. She has remained chest pain-free throughout her ED visit.  Patient has been advised to return to the ED if chest pain becomes exertional, associated with diaphoresis or nausea, radiates to left jaw/arm, worsens or becomes concerning in any way. Patient appears reliable for follow up and is agreeable to discharge. I advised the patient to follow-up with their primary care provider and psychiatrist this week. I advised the patient to return to the emergency department with new or worsening symptoms or new concerns. The patient verbalized understanding and agreement with plan.    This patient was discussed with Dr. Ralene Bathe who agrees with assessment and plan.      Waynetta Pean, PA-C 02/28/15 1523  Quintella Reichert, MD 02/28/15 [phone]

## 2015-02-28 NOTE — ED Provider Notes (Signed)
MSE was initiated and I personally evaluated the patient and placed orders (if any) at  1:09 PM on February 28, 2015.  Patient presents from behavioral health initially complaining of anxiety and dizziness.Patient reports walking 5 miles to behavioral health today and then while sitting and behavioral health meeting she felt lightheaded, dizzy and had chest pain or shortness of breath. She reports feeling like she would pass out. She reports this is now resolved. She is currently complaining of lots of pain "all over her body." She also is complaining of lots of pain behind her knees and she is concerned about cramping in her legs. I discussed whether or not she felt like this chest pain was due to her anxiety and she is unsure yet she appears very anxious. She is concerned about her new pain in her legs that has been ongoing for 3 weeks. She denies LOC, CP or shortness of breath currently. I feel the patient at least needs basic blood work and orthostatics due to her symptoms. Will initiate orders and move from fast track.   The patient appears stable so that the remainder of the MSE may be completed by another provider.  Waynetta Pean, PA-C 02/28/15 1314  Quintella Reichert, MD 02/28/15 [phone]

## 2015-02-28 NOTE — ED Notes (Signed)
Bed: WTR8 Expected date:  Expected time:  Means of arrival:  Comments: Anxiety just dc from [facility].

## 2015-02-28 NOTE — ED Notes (Addendum)
Pt stated that she walked 5 miles to her meeting at [facility]. She reported feeling lightheaded and anxious during the meeting. Has not taken an Ativan for a few days. Also c/o, slight headache that has decreased over the last hour, c/o recurrent r/knee pain. Reports frequent urination today and noted that she has been drinking tea and water all morning. Pt is alert, oriented and cooperative. Ambulated to bathroom with supervision.

## 2015-03-05 ENCOUNTER — Ambulatory Visit (INDEPENDENT_AMBULATORY_CARE_PROVIDER_SITE_OTHER): Payer: Medicare Other | Admitting: Family

## 2015-03-05 ENCOUNTER — Encounter: Payer: Self-pay | Admitting: Family

## 2015-03-05 VITALS — BP 120/76 | HR 79 | Temp 98.4°F | Resp 18 | Ht 64.0 in | Wt 161.0 lb

## 2015-03-05 DIAGNOSIS — Z09 Encounter for follow-up examination after completed treatment for conditions other than malignant neoplasm: Secondary | ICD-10-CM | POA: Diagnosis not present

## 2015-03-05 DIAGNOSIS — M25561 Pain in right knee: Secondary | ICD-10-CM | POA: Insufficient documentation

## 2015-03-05 DIAGNOSIS — R079 Chest pain, unspecified: Secondary | ICD-10-CM | POA: Diagnosis not present

## 2015-03-05 DIAGNOSIS — M25562 Pain in left knee: Secondary | ICD-10-CM | POA: Diagnosis not present

## 2015-03-05 NOTE — Assessment & Plan Note (Signed)
Symptoms of chest pain experienced in the emergency room spontaneously resolved and no additional episodes of chest pain noted. Most likely related to anxiety given the cardiac workup in the emergency room was negative. Follow-up if symptoms return.

## 2015-03-05 NOTE — Patient Instructions (Signed)
Thank you for choosing [facility].  Summary/Instructions:  Try glucosamine and chondroitin for your joints.,   If your symptoms worsen or fail to improve, please contact our office for further instruction, or in case of emergency go directly to the emergency room at the closest medical facility.

## 2015-03-05 NOTE — Progress Notes (Signed)
Pre visit review using our clinic review tool, if applicable. No additional management support is needed unless otherwise documented below in the visit note. 

## 2015-03-05 NOTE — Assessment & Plan Note (Signed)
Bilateral knee pain of undetermined origin has spontaneously resolved since being seen in the emergency room. Discussed possible causes of pain. Continue to monitor at this time and follow up if symptoms return.

## 2015-03-05 NOTE — Progress Notes (Signed)
Subjective:    Patient ID: Morgan [name], female    DOB: April 19, 1966, 49 y.o.   MRN: [id number]  Chief Complaint  Patient presents with  . Establish Care    has had alot of pain behind the knees, hasn't had the pain since friday, joint pain, had an episode of dizziness and SOB which she has had before, was told it was anxiety but she does not believe it is    HPI:  Morgan [name] is a 49 y.o. female with a PMH of bipolar disorder, arthritis, irritable bowel syndrome and anxiety who presents today for a hospitalization follow up.    Recently seen in the Emergency room for lightheadedness, dizziness, chest pain and shortness of breath with sudden onset that resolved upon arrival to the ED. She was also noted to have cramping leg pain behind her knees. Her physical exam revealed no evidence for knee pain and her EKG showed normal sinus rhythm. D-dimer was negative and her chest x-ray was unremarkable. All emergency room records were reviewed in detail.   1.) Chest pain - Associated symptom of chest pain previously experienced in the ED has resolved. Denies any episodes of shortness of breath, chest pain or chest tightness since the initial onset.  2.) Knee pain - Associated symptom of bilateral knee pain located in the back of her knees that she was experiencing in the ED have resolved with no recurrance to this point.     Allergies  Allergen Reactions  . Abilify [Aripiprazole] Swelling    Throat begins to swell   . Ciprofloxacin Shortness Of Breath  . Lamictal [Lamotrigine] Rash  . Amoxicillin Hives  . Doxycycline Hives  . Latex Rash  . Meloxicam Other (See Comments)    Induces mania  . Mirapex [Pramipexole] Hives  . Risperdal [Risperidone] Hives  . Cortisone     Exacerbates the mania of her bipolar  . Sulfa Antibiotics Rash     Outpatient Prescriptions Prior to Visit  Medication Sig Dispense Refill  . albuterol (PROVENTIL) (5 MG/ML) 0.5% nebulizer solution Take 2.5 mg by  nebulization every 6 (six) hours as needed for wheezing or shortness of breath.     . cetirizine (ZYRTEC) 10 MG tablet Take 10 mg by mouth daily.    Marland Kitchen gabapentin (NEURONTIN) 100 MG capsule 2  bid (Patient taking differently: Take 200 mg by mouth 2 (two) times daily. ) 120 capsule 4  . hyoscyamine (LEVSIN, ANASPAZ) 0.125 MG tablet Take 0.125 mg by mouth as needed for cramping.     Marland Kitchen ibuprofen (ADVIL,MOTRIN) 100 MG tablet Take 400-800 mg by mouth every 6 (six) hours as needed for pain.    Marland Kitchen LORazepam (ATIVAN) 0.5 MG tablet 1  qday  prn (Patient taking differently: Take 0.5 mg by mouth daily as needed for anxiety. ) 10 tablet 1  . levothyroxine (SYNTHROID, LEVOTHROID) 50 MCG tablet Take 50 mcg by mouth daily.     . naproxen (NAPROSYN) 250 MG tablet Take 1 tablet (250 mg total) by mouth 2 (two) times daily with a meal. 30 tablet 0   No facility-administered medications prior to visit.     Past Medical History  Diagnosis Date  . Asthma   . IBS (irritable bowel syndrome) 1995  . Arthritis   . Degenerative disorder of bone   . Bulging lumbar disc 07/19/05  . Bipolar disorder   . History of borderline personality disorder   . Anxiety   . Hypothyroidism 2007    Developed after  use of Lithium     Past Surgical History  Procedure Laterality Date  . Cholecystectomy    . Tubal ligation    . Abdominal hysterectomy    . Shoulder open rotator cuff repair Right 03/2010  . Bunionectomy Right 06/2012  . Appendectomy       Family History  Problem Relation Age of Onset  . Depression Mother   . Hypertension Mother   . Post-traumatic stress disorder Sister   . Alcohol abuse Brother   . Drug abuse Brother   . Post-traumatic stress disorder Brother   . Thyroid disease Father   . Alzheimer's disease Father   . COPD Maternal Grandmother   . Heart disease Maternal Grandfather   . Arthritis Paternal Grandmother   . Diabetes Paternal Grandmother   . Depression Paternal Grandmother   .  Alzheimer's disease Paternal Grandmother   . Heart disease Paternal Grandfather   . Coronary artery disease Paternal Grandfather   . Alcohol abuse Paternal Grandfather      Social History   Social History  . Marital Status: Divorced    Spouse Name: N/A  . Number of Children: 2  . Years of Education: 14   Occupational History  . Unemployed    Social History Main Topics  . Smoking status: Former Smoker    Quit date: 07/20/1980  . Smokeless tobacco: Never Used  . Alcohol Use: 0.0 oz/week    0 Standard drinks or equivalent per week     Comment: occ  . Drug Use: No  . Sexual Activity: Not Currently    Birth Control/ Protection: Surgical   Other Topics Concern  . Not on file   Social History Narrative   Fun: [place], travel, music, writing, walking, hiking, volunteering   Denies religious beliefs effecting health care.    Feels safe at home.      Review of Systems  Eyes:       Negative for changes in vision.   Respiratory: Negative for cough and chest tightness.   Cardiovascular: Negative for chest pain, palpitations and leg swelling.  Musculoskeletal: Positive for back pain and neck pain. Negative for myalgias and joint swelling.      Objective:    BP 120/76 mmHg  Pulse 79  Temp(Src) 98.4 F (36.9 C) (Oral)  Resp 18  Ht 5\' 4"  (1.626 m)  Wt 161 lb (73.029 kg)  BMI 27.62 kg/m2  SpO2 98% Nursing note and vital signs reviewed.  Physical Exam  Constitutional: She is oriented to person, place, and time. She appears well-developed and well-nourished. No distress.  Cardiovascular: Normal rate, regular rhythm, normal heart sounds and intact distal pulses.   Pulmonary/Chest: Effort normal and breath sounds normal.  Musculoskeletal:  Bilateral knees - no obvious deformity, discoloration, or edema noted. No palpable tenderness able to be elicited upon examination. Meniscal and ligamentous tests are negative. Range of motion is full and strength is normal. Distal pulses  and sensation are intact and appropriate.  Neurological: She is alert and oriented to person, place, and time.  Skin: Skin is warm and dry.  Psychiatric: She has a normal mood and affect. Her behavior is normal. Judgment and thought content normal.       Assessment & Plan:   Problem List Items Addressed This Visit      Other   Knee pain, bilateral - Primary    Bilateral knee pain of undetermined origin has spontaneously resolved since being seen in the emergency room. Discussed possible causes of  pain. Continue to monitor at this time and follow up if symptoms return.      Chest pain    Symptoms of chest pain experienced in the emergency room spontaneously resolved and no additional episodes of chest pain noted. Most likely related to anxiety given the cardiac workup in the emergency room was negative. Follow-up if symptoms return.

## 2015-03-07 ENCOUNTER — Encounter (HOSPITAL_COMMUNITY): Payer: Self-pay | Admitting: Clinical

## 2015-03-07 ENCOUNTER — Ambulatory Visit (INDEPENDENT_AMBULATORY_CARE_PROVIDER_SITE_OTHER): Payer: Medicare Other | Admitting: Clinical

## 2015-03-07 DIAGNOSIS — F319 Bipolar disorder, unspecified: Secondary | ICD-10-CM

## 2015-03-07 DIAGNOSIS — F3132 Bipolar disorder, current episode depressed, moderate: Secondary | ICD-10-CM

## 2015-03-07 DIAGNOSIS — F331 Major depressive disorder, recurrent, moderate: Secondary | ICD-10-CM

## 2015-03-07 NOTE — Progress Notes (Signed)
   THERAPIST PROGRESS NOTE  Session Time: 10:00 - 10:55  Participation Level: Active  Behavioral Response: CasualAlertAnxious and Depressed   Type of Therapy: Individual Therapy  Treatment Goals addressed: improve psychiatric symptoms, Improve unhelpful thinking patterns, self-care skills, and reduce unhealthy behaviors/increase healthy behaviors  Interventions: CBT and Motivational Interviewing, Grounding and Mindfulness Techniques  Summary: Janvi Ammar is a 48 y.o. female who presents with Bipolar I disorder, most recent episode (or current) unspecified, Borderline Personality Disorder  Suicidal/Homicidal: No -without intent/plan  Therapist Response: Alaira met with clinician for an individual session. Arynn discussed his psychiatric symptoms, her current life events and her homework. She shared that she has been doing her homework and is seeing some benefit. She shared that it is helping her to change her thoughts. Carliyah shared that there is a possible medical issue for her experience last week (other than anxiety). She shared that she is being tested to figure it out. She reported that she felt anxiety in addition to other physical sensations. She reported that she was currently feeling better physically. She reported that she was experiencing some depression and anxiety. Her daughter is going to be moving in and sharing a room with her. She shared that this brought up a lot of emotions and concerns. She shared about some her concerns. She shared about how the thought of  living with her daughter is stirring emotions. She shared that she has found her self using distraction to deal with her emotions rather than being present with them. Client and clinician discussed ways that she could improve her mindfulness skills. Client and clinician discussed  Self care. Marimar shared that she would continue to walk and improve what she eats. She shared she will continue to journal and  participate in her church. Ravan agreed to continue her homework until next session.  Plan: Return again in 1 weeks.  Diagnosis: Axis I:   Bipolar I disorder, most recent episode (or current) unspecified, Borderline Personality Disorder    Cailah Reach A, LCSW 03/07/2015

## 2015-03-08 ENCOUNTER — Other Ambulatory Visit (HOSPITAL_COMMUNITY)
Admission: RE | Admit: 2015-03-08 | Discharge: 2015-03-08 | Disposition: A | Discharge status: Home / Self Care | Payer: Medicare Other | Source: Ambulatory Visit | Attending: Gynecology | Admitting: Gynecology

## 2015-03-08 ENCOUNTER — Encounter: Payer: Self-pay | Admitting: Gynecology

## 2015-03-08 ENCOUNTER — Ambulatory Visit (INDEPENDENT_AMBULATORY_CARE_PROVIDER_SITE_OTHER): Payer: Medicare Other | Admitting: Gynecology

## 2015-03-08 VITALS — BP 126/82 | Ht 63.0 in | Wt 167.0 lb

## 2015-03-08 DIAGNOSIS — Z1151 Encounter for screening for human papillomavirus (HPV): Secondary | ICD-10-CM | POA: Insufficient documentation

## 2015-03-08 DIAGNOSIS — Z124 Encounter for screening for malignant neoplasm of cervix: Secondary | ICD-10-CM | POA: Insufficient documentation

## 2015-03-08 DIAGNOSIS — Z01419 Encounter for gynecological examination (general) (routine) without abnormal findings: Secondary | ICD-10-CM | POA: Diagnosis not present

## 2015-03-08 NOTE — Progress Notes (Signed)
Morgan [name] 1965/12/22 [id number]   History:    49 y.o.  for annual gyn exam who is new to the area. Patient has moved from [facility]. Patient is a gravida 2 para 2 with past history of laparoscopic-assisted vaginal hysterectomy secondary to pelvic pain and menorrhagia. H and not sexually active. Patient on no hormone replacement therapy. Patient reports no history of abnormal Pap smears in the past. She is overdue for mammogram. She did have a colonoscopy in 2013 benign polyps were removed. She has never been on any hormone replacement therapy. It has been less than 2 years from her last bone density study. She describes that she has history of degenerative joint disease. She is currently seen in the emergency room as a result of shortness of breath and dizziness at headaches and joint pain see emergency room encounter note separately.  Past medical history,surgical history, family history and social history were all reviewed and documented in the EPIC chart.  Gynecologic History No LMP recorded. Patient has had a hysterectomy. Contraception: status post hysterectomy Last Pap: Over 2 years ago. Results were: normal Last mammogram: Over 2 years ago. Results were: normal  Obstetric History OB History  Gravida Para Term Preterm AB SAB TAB Ectopic Multiple Living  2 2 2  0 0 0 0 0 0 2    # Outcome Date GA Lbr Len/2nd Weight Sex Delivery Anes PTL Lv  2 Term           1 Term                ROS: A ROS was performed and pertinent positives and negatives are included in the history.  GENERAL: No fevers or chills. HEENT: No change in vision, no earache, sore throat or sinus congestion. NECK: No pain or stiffness. CARDIOVASCULAR: No chest pain or pressure. No palpitations. PULMONARY: No shortness of breath, cough or wheeze. GASTROINTESTINAL: No abdominal pain, nausea, vomiting or diarrhea, melena or bright red blood per rectum. GENITOURINARY: No urinary frequency, urgency,  hesitancy or dysuria. MUSCULOSKELETAL: No joint or muscle pain, no back pain, no recent trauma. DERMATOLOGIC: No rash, no itching, no lesions. ENDOCRINE: No polyuria, polydipsia, no heat or cold intolerance. No recent change in weight. HEMATOLOGICAL: No anemia or easy bruising or bleeding. NEUROLOGIC: No headache, seizures, numbness, tingling or weakness. PSYCHIATRIC: No depression, no loss of interest in normal activity or change in sleep pattern.     Exam: chaperone present  BP 126/82 mmHg  Ht 5\' 3"  (1.6 m)  Wt 167 lb (75.751 kg)  BMI 29.59 kg/m2  Body mass index is 29.59 kg/(m^2).  General appearance : Well developed well nourished female. No acute distress HEENT: Eyes: no retinal hemorrhage or exudates,  Neck supple, trachea midline, no carotid bruits, no thyroidmegaly Lungs: Clear to auscultation, no rhonchi or wheezes, or rib retractions  Heart: Regular rate and rhythm, no murmurs or gallops Breast:Examined in sitting and supine position were symmetrical in appearance, no palpable masses or tenderness,  no skin retraction, no nipple inversion, no nipple discharge, no skin discoloration, no axillary or supraclavicular lymphadenopathy Abdomen: no palpable masses or tenderness, no rebound or guarding Extremities: no edema or skin discoloration or tenderness  Pelvic:  Bartholin, Urethra, Skene Glands: Within normal limits             Vagina: No gross lesions or discharge atrophic changes  Cervix: Absent  Uterus  absent  Adnexa  Without masses or tenderness  Anus and perineum  normal   Rectovaginal  normal sphincter tone without palpated masses or tenderness             Hemoccult cards will be provided     Assessment/Plan:  49 y.o. female for annual exam menopausal on no hormone replacement therapy vaginal atrophy noted asymptomatic otherwise. Not sexually active. We discussed Vagifem 10 g tablets to apply twice a week patient not interested literature information will be provided.  Since we have no copy of previous Pap smear will do one today and then adhere to the new guidelines. She was provided with fecal Hemoccult cards to submit to the office for testing. Patient will follow-up with her PCP with the above-mentioned symptoms. She appears to have fibromyalgia as well. Her PCP we'll be doing her blood work. Next year will do a bone density study here in the office.   Terrance Mass MD, 12:25 PM 03/08/2015

## 2015-03-08 NOTE — Patient Instructions (Signed)
Estradiol vaginal tablets What is this medicine? ESTRADIOL (es tra DYE ole) vaginal tablet is used to help relieve symptoms of vaginal irritation and dryness that occurs in some women during menopause. This medicine may be used for other purposes; ask your health care provider or pharmacist if you have questions. COMMON BRAND NAME(S): Vagifem What should I tell my health care provider before I take this medicine? They need to know if you have any of these conditions: -abnormal vaginal bleeding -blood vessel disease or blood clots -breast, cervical, endometrial, ovarian, liver, or uterine cancer -dementia -diabetes -gallbladder disease -heart disease or recent heart attack -high blood pressure -high cholesterol -high level of calcium in the blood -hysterectomy -kidney disease -liver disease -migraine headaches -protein C deficiency -protein S deficiency -stroke -systemic lupus erythematosus (SLE) -tobacco smoker -an unusual or allergic reaction to estrogens, other hormones, medicines, foods, dyes, or preservatives -pregnant or trying to get pregnant -breast-feeding How should I use this medicine? This medicine is only for use in the vagina. Do not take by mouth. Wash your hands before and after use. Read package directions carefully. Unwrap the pre-filled applicator package. Lie on your back, part and bend your knees. Gently insert the applicator tip high in the vagina and push the plunger to release the tablet into the vagina. Gently remove the applicator. Throw away the applicator after use. Do not use your medicine more often than directed. Finish the full course prescribed by your doctor or health care professional even if you think your condition is better. Do not stop using except on the advice of your doctor or health care professional. Talk to your pediatrician regarding the use of this medicine in children. A patient package insert for the product will be given with each  prescription and refill. Read this sheet carefully each time. The sheet may change frequently. Overdosage: If you think you have taken too much of this medicine contact a poison control center or emergency room at once. NOTE: This medicine is only for you. Do not share this medicine with others. What if I miss a dose? If you miss a dose, take it as soon as you can. If it is almost time for your next dose, take only that dose. Do not take double or extra doses. What may interact with this medicine? Do not take this medicine with any of the following medications: -aromatase inhibitors like aminoglutethimide, anastrozole, exemestane, letrozole, testolactone This medicine may also interact with the following medications: -antibiotics used to treat tuberculosis like rifabutin, rifampin and rifapentene -raloxifene or tamoxifen -warfarin This list may not describe all possible interactions. Give your health care provider a list of all the medicines, herbs, non-prescription drugs, or dietary supplements you use. Also tell them if you smoke, drink alcohol, or use illegal drugs. Some items may interact with your medicine. What should I watch for while using this medicine? Visit your health care professional for regular checks on your progress. You will need a regular breast and pelvic exam. You should also discuss the need for regular mammograms with your health care professional, and follow his or her guidelines. This medicine can make your body retain fluid, making your fingers, hands, or ankles swell. Your blood pressure can go up. Contact your doctor or health care professional if you feel you are retaining fluid. If you have any reason to think you are pregnant; stop taking this medicine at once and contact your doctor or health care professional. Tobacco smoking increases the risk of getting  a blood clot or having a stroke, especially if you are more than 50 years old. You are strongly advised not to  smoke. If you wear contact lenses and notice visual changes, or if the lenses begin to feel uncomfortable, consult your eye care specialist. If you are going to have elective surgery, you may need to stop taking this medicine beforehand. Consult your health care professional for advice prior to scheduling the surgery. What side effects may I notice from receiving this medicine? Side effects that you should report to your doctor or health care professional as soon as possible: -allergic reactions like skin rash, itching or hives, swelling of the face, lips, or tongue -breast tissue changes or discharge -changes in vision -chest pain -confusion, trouble speaking or understanding -dark urine -general ill feeling or flu-like symptoms -light-colored stools -nausea, vomiting -pain, swelling, warmth in the leg -right upper belly pain -severe headaches -shortness of breath -sudden numbness or weakness of the face, arm or leg -trouble walking, dizziness, loss of balance or coordination -unusual vaginal bleeding -yellowing of the eyes or skin Side effects that usually do not require medical attention (report to your doctor or health care professional if they continue or are bothersome): -hair loss -increased hunger or thirst -increased urination -symptoms of vaginal infection like itching, irritation or unusual discharge -unusually weak or tired This list may not describe all possible side effects. Call your doctor for medical advice about side effects. You may report side effects to FDA at 1-800-FDA-1088. Where should I keep my medicine? Keep out of the reach of children. Store at room temperature between 15 and 30 degrees C (59 and 86 degrees F). Throw away any unused medicine after the expiration date. NOTE: This sheet is a summary. It may not cover all possible information. If you have questions about this medicine, talk to your doctor, pharmacist, or health care provider.  2015,  Elsevier/Gold Standard. (2010-10-08 09:08:58)

## 2015-03-12 LAB — CYTOLOGY - PAP

## 2015-03-14 ENCOUNTER — Ambulatory Visit (HOSPITAL_COMMUNITY): Payer: Medicare Other | Admitting: Clinical

## 2015-03-19 ENCOUNTER — Ambulatory Visit (INDEPENDENT_AMBULATORY_CARE_PROVIDER_SITE_OTHER): Payer: Medicare Other | Admitting: Clinical

## 2015-03-19 DIAGNOSIS — F603 Borderline personality disorder: Secondary | ICD-10-CM

## 2015-03-19 DIAGNOSIS — F319 Bipolar disorder, unspecified: Secondary | ICD-10-CM
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
## 2015-03-20 ENCOUNTER — Encounter (HOSPITAL_COMMUNITY): Payer: Self-pay | Admitting: Clinical

## 2015-03-20 ENCOUNTER — Ambulatory Visit (INDEPENDENT_AMBULATORY_CARE_PROVIDER_SITE_OTHER): Payer: Medicare Other | Admitting: Clinical

## 2015-03-20 DIAGNOSIS — F603 Borderline personality disorder: Secondary | ICD-10-CM

## 2015-03-20 DIAGNOSIS — F319 Bipolar disorder, unspecified: Secondary | ICD-10-CM | POA: Diagnosis not present

## 2015-03-20 NOTE — Progress Notes (Signed)
   THERAPIST PROGRESS NOTE  Session Time: 9:05 -10:05  Participation Level: Active  Behavioral Response: CasualAlertAngry and Depressed  Type of Therapy: Individual Therapy  Treatment Goals addressed: improve psychiatric symptoms, Improve unhelpful thinking patterns, self-care skills, and reduce unhealthy behaviors/increase healthy behaviors  Interventions: CBT and Motivational Interviewing, Grounding and Mindfulness Techniques  Summary: Maisey Deandrade is a 49 y.o. female who presents with Bipolar I disorder, most recent episode (or current) unspecified, Borderline Personality Disorder  Suicidal/Homicidal: No -without intent/plan  Therapist Response: Zlata met with clinician for an individual session. Huxley discussed his psychiatric symptoms, her current life events and her homework. She shared that she came to see clinician today because yesterday after her appointment she was angry and thinking about self harm. Deeanne shared that she did not self harm. She shared that she ate gellato instead. She shared that she had thought about drinking and that in her mind at the time it would be clinician fault. Client and clinician discussed this thought process. Client and clinician discussed the evidence for and against the thought. Ferne shared that she recognized it was an immature way of thinking. Client and clinician discussed the insights that Vinetta had about this. She shared she would be giving away  The responsibilities of her actions. She also shared that by being able to express that she was angry with clinician it allowed her to feel safe as she was not allowed that freedom in her family. Clinician affirmed that she was doing good work and that she was indeed expressing her anger in a healthier way than in the past. Client and clinician discussed some of her self care insights. Taliyah shared that because she ate gellato rather than drink she did not have any negative consequences.  Client and clinician discussed her anger at clinician and at her parents. Ewa and clinician did a grounding technique together. Octavia and clinician discussed  Further self care and healthy behaviors. Wandra denied any desire to self harm, suicidal or homicidal ideation. Glendi reported feeling "pretty good." She agreed to use the emergency numbers if needed  Plan: Return again in 1 weeks.  Diagnosis: Axis I:   Bipolar I disorder, most recent episode (or current) unspecified, Borderline Personality Disorder   Aquan Kope A, LCSW 03/20/2015

## 2015-03-22 ENCOUNTER — Encounter (HOSPITAL_COMMUNITY): Payer: Self-pay | Admitting: Clinical

## 2015-03-28 ENCOUNTER — Encounter (HOSPITAL_COMMUNITY): Payer: Self-pay | Admitting: Clinical

## 2015-03-28 ENCOUNTER — Ambulatory Visit (INDEPENDENT_AMBULATORY_CARE_PROVIDER_SITE_OTHER): Payer: Medicare Other | Admitting: Clinical

## 2015-03-28 DIAGNOSIS — F319 Bipolar disorder, unspecified: Secondary | ICD-10-CM

## 2015-03-28 DIAGNOSIS — Z23 Encounter for immunization: Secondary | ICD-10-CM | POA: Diagnosis not present

## 2015-03-28 DIAGNOSIS — F603 Borderline personality disorder: Secondary | ICD-10-CM
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
## 2015-04-02 ENCOUNTER — Telehealth (HOSPITAL_COMMUNITY): Payer: Self-pay | Admitting: Clinical

## 2015-04-02 NOTE — Telephone Encounter (Signed)
Phone call

## 2015-04-04 ENCOUNTER — Ambulatory Visit (INDEPENDENT_AMBULATORY_CARE_PROVIDER_SITE_OTHER): Payer: Medicare Other | Admitting: Clinical

## 2015-04-04 ENCOUNTER — Encounter (HOSPITAL_COMMUNITY): Payer: Self-pay | Admitting: Clinical

## 2015-04-04 DIAGNOSIS — F319 Bipolar disorder, unspecified: Secondary | ICD-10-CM

## 2015-04-04 DIAGNOSIS — F603 Borderline personality disorder: Secondary | ICD-10-CM | POA: Diagnosis not present

## 2015-04-04 NOTE — Progress Notes (Signed)
   THERAPIST PROGRESS NOTE  Session Time: 10:01 - 10:57  Participation Level: Active  Behavioral Response: CasualAlertAnxious and Depressed  Type of Therapy: Individual Therapy  Treatment Goals addressed: improve psychiatric symptoms, Improve unhelpful thinking patterns, self-care skills, and reduce unhealthy behaviors/increase healthy behaviors  Interventions: CBT and Motivational Interviewing, Grounding and Mindfulness Techniques  Summary: Morgan [name] is a 49 y.o. female who presents with Bipolar I disorder, most recent episode (or current) unspecified, Borderline Personality Disorder  Suicidal/Homicidal: No -without intent/plan  Therapist Response: Akanksha met with clinician for an individual session. [name] discussed his psychiatric symptoms, her current life events and her homework. [name] shared that she was very emotional this past week. She denied any self harming behavior. [name] shared about the healthy coping techniques she used. [name] stated that she has been working diligently to take responsibility for her own actions. She shared that she had spoken to her sister who shed some  light on her past. [name] shared how her conversation with her sister affected her emotionally. [name] shared the negative thoughts and emotions about herself that arose from the conversation. Client and clinician discussed alternative ways to view the situations discussed. [name] and clinician discussed self forgiveness. [name] shared that she sometimes feels guilty or otherwise affect because of the images that come into her mind.  Client and clinician discussed the difference between thoughts and behaviors. [name] and clinician discussed ways to redirect her thoughts. [name] and clinician discussed ways she could continue to improve her self care.Client and clinician discussed the importance of being present today rather than completely focused on past events.  [name] agreed to continue to  practice her grounding and mindfulness techniques until next session.     Plan: Return again in 1 weeks.  Diagnosis: Axis I:   Bipolar I disorder, most recent episode (or current) unspecified, Borderline Personality Disorder  Reiana Poteet A, LCSW 04/04/2015

## 2015-04-11 ENCOUNTER — Encounter (HOSPITAL_COMMUNITY): Payer: Self-pay | Admitting: Clinical

## 2015-04-11 ENCOUNTER — Ambulatory Visit (INDEPENDENT_AMBULATORY_CARE_PROVIDER_SITE_OTHER): Payer: Medicare Other | Admitting: Clinical

## 2015-04-11 DIAGNOSIS — F319 Bipolar disorder, unspecified: Secondary | ICD-10-CM | POA: Diagnosis not present

## 2015-04-11 DIAGNOSIS — F603 Borderline personality disorder: Secondary | ICD-10-CM

## 2015-04-11 NOTE — Progress Notes (Signed)
   THERAPIST PROGRESS NOTE  Session Time: 11:02 -12:00  Participation Level: Active  Behavioral Response: CasualAlertDepressed  Type of Therapy: Individual Therapy  Treatment Goals addressed: improve psychiatric symptoms, Improve unhelpful thinking patterns,  and reduce unhealthy behaviors/increase healthy behaviors  Interventions: CBT and Motivational Interviewing, Grounding and Mindfulness Techniques  Summary: Morgan [name] is a 49 y.o. female who presents with Bipolar I disorder, most recent episode (or current) unspecified, Borderline Personality Disorder  Suicidal/Homicidal: No -without intent/plan  Therapist Response: Rocky met with clinician for an individual session. Morgan [name] discussed his psychiatric symptoms, her current life events and her homework. She shared that she had a rough week this past week. She shared that she continued to focus on her past and was not using her healthy coping skills is much as she felt she could. She shared however that she did not have any self harming behaviors. Morgan [name] shared that she was mad at clinician and the facility. She shared that she was angry because she could not see clinician as often as she would like. Client and clinician discussed the purpose of therapy as a support for learning and processing.  Clinician offered to refer Morgan [name] to a higher level of care (such as IOP) if she needed more constant care. Morgan [name] shared that she had healthy coping skills and understood them but wanted clinician available to her. Clinician encouraged Morgan [name] to use her healthy coping skills as a form of self care when not in therapy so that client and clinician could work together to modify them for the best outcome. Morgan [name] shared a about her children. She shared a about the relationship with her children and her desire to have those relationships be healthier. Client and clinician discussed behaviors that would be beneficial to the relationship and  behaviors that were unhealthy.  Clinician encouraged Morgan [name] to have more out activities and interactions that were focused on things besides her mental health. Morgan [name] expressed her opinion and frustration that if she didn't focus on solving her past then she wouldn't ever get better. Clinician explained that there needed to be balance some focus on healing and  focus on being present and what was currently going on in her life. Clinician suggested that Morgan [name] benefit from volunteering or in [place] spending time helping others (focusing on something outside of herself).  Morgan [name] and clinician discussed her grounding techniques. Morgan [name] agreed to continue her grounding and mindfulness techniques and Morgan [name] messed session. Clinician encouraged Morgan [name] to use her safety plan should she need. Morgan [name] said that she had the numbers for calling should she have an emergency or feel like harming herself.  Plan: Return again in 1 weeks.  Diagnosis: Axis I:   Bipolar I disorder, most recent episode (or current) unspecified, Borderline Personality Disorder  Anger   Powell,Frances A, LCSW 04/11/2015

## 2015-04-15 ENCOUNTER — Encounter: Payer: Self-pay | Admitting: Internal Medicine

## 2015-04-15 ENCOUNTER — Ambulatory Visit (INDEPENDENT_AMBULATORY_CARE_PROVIDER_SITE_OTHER): Payer: Medicare Other | Admitting: Internal Medicine

## 2015-04-15 VITALS — BP 122/70 | HR 67 | Temp 98.5°F | Resp 18 | Ht 63.5 in | Wt 169.8 lb

## 2015-04-15 DIAGNOSIS — Z23 Encounter for immunization: Secondary | ICD-10-CM

## 2015-04-15 DIAGNOSIS — M25562 Pain in left knee: Secondary | ICD-10-CM | POA: Diagnosis not present

## 2015-04-15 DIAGNOSIS — F319 Bipolar disorder, unspecified: Secondary | ICD-10-CM

## 2015-04-15 DIAGNOSIS — R079 Chest pain, unspecified: Secondary | ICD-10-CM | POA: Diagnosis not present

## 2015-04-15 DIAGNOSIS — L989 Disorder of the skin and subcutaneous tissue, unspecified: Secondary | ICD-10-CM | POA: Diagnosis not present

## 2015-04-15 DIAGNOSIS — M25561 Pain in right knee: Secondary | ICD-10-CM | POA: Diagnosis not present

## 2015-04-15 MED ORDER — DICLOFENAC SODIUM 1 % TD GEL: 2.0000 g | Freq: Three times a day (TID) | TRANSDERMAL | Status: DC | PRN

## 2015-04-15 NOTE — Assessment & Plan Note (Signed)
Unclear if she is in remission. She is kind of dazed on exam today and very poor historian. Seeing behavioral health.

## 2015-04-15 NOTE — Assessment & Plan Note (Signed)
Will try voltaren gel for pain, no need for imaging at this time. Talked with her about stretching when exercising.

## 2015-04-15 NOTE — Patient Instructions (Signed)
We have sent in a medicine called voltaren gel which you can use on the places that you are having muscle pain up to 3 times per day.   We have given you the tetanus shot today.  You do not need any blood work today.  Come back in about 6 months or sooner if you are having new problems or questions.   We will get your records

## 2015-04-15 NOTE — Progress Notes (Addendum)
   Subjective:    Patient ID: Morgan [name], female    DOB: 08/04/1965, 49 y.o.   MRN: [id number]  HPI The patient is a 49 YO coming in for several acute concerns. She is still having knee pain that is located in the back of her knee. Has been coming mostly at rest. Over the last several months. Stable since onset. Taking OTC pain medicaitons with reasonable relief. Does not hurt with exercising.  Next concern is a lesion on her scalp which was diagnosed as some kind of cancer (she does not know the kind of when it was diagnosed). She is not sure if it is growing over the last 10 years but then thinks that it likely is growing. Tender to palpation but no pain without touching it. No exudate from it. No infection in it. No color change on the scalp. Has never had it biopsied.  Still having these episodes of chest pains with breathing problems. Evaluated with cardiology prior to moving in the last 1 year. Thinks she had a stress test which was fine. Gets some numbness and tingling in her hands when it happens. Has happened singing at church and during a therapy session. They told her it was related to anxiety and she is not sure if that is true. Walks 5 miles per day and does not get those symptoms.   PMH, [facility], social history reviewed and updated.   Review of Systems  Constitutional: Negative for fever, activity change, appetite change, fatigue and unexpected weight change.  HENT: Negative.   Eyes: Negative.   Respiratory: Negative for cough, chest tightness, shortness of breath and wheezing.   Cardiovascular: Positive for chest pain. Negative for palpitations and leg swelling.  Gastrointestinal: Negative for nausea, abdominal pain, diarrhea, constipation and abdominal distention.  Musculoskeletal: Positive for arthralgias. Negative for myalgias, back pain and gait problem.  Skin:       lesion  Neurological: Negative for dizziness, syncope, weakness, light-headedness and headaches.    Psychiatric/Behavioral: Positive for dysphoric mood and decreased concentration. Negative for hallucinations and sleep disturbance. The patient is nervous/anxious.      Objective:   Physical Exam  Constitutional: She is oriented to person, place, and time. She appears well-developed and well-nourished.  HENT:  Head: Normocephalic and atraumatic.  Small lesion on the right temporal region, not movable, no skin breakdown, hair intact over the lesion  Eyes: EOM are normal.  Neck: Normal range of motion.  Cardiovascular: Normal rate and regular rhythm.   Pulmonary/Chest: Effort normal and breath sounds normal. No respiratory distress. She has no wheezes.  Abdominal: Soft. She exhibits no distension. There is no tenderness. There is no rebound.  Musculoskeletal: She exhibits no edema.  Neurological: She is alert and oriented to person, place, and time.  Skin: Skin is warm and dry.  Psychiatric:  Slightly high energy and hard to keep on track   Filed Vitals:   04/15/15 0900  BP: 122/70  Pulse: 67  Temp: 98.5 F (36.9 C)  TempSrc: Oral  Resp: 18  Height: 5' 3.5" (1.613 m)  Weight: 169 lb 12.8 oz (77.021 kg)  SpO2: 98%      Assessment & Plan:  Flu shot and tetanus shot given at visit.

## 2015-04-15 NOTE — Assessment & Plan Note (Addendum)
She originally stated that this was growing but not sure if it has grown over the last 10 years. Has had imaging, no biopsy. Will obtain records and watch for growth on exam.

## 2015-04-15 NOTE — Assessment & Plan Note (Signed)
Does not sound cardiac in nature, possibly related to her mental health and she is seeing behavioral health.

## 2015-04-15 NOTE — Progress Notes (Signed)
Pre visit review using our clinic review tool, if applicable. No additional management support is needed unless otherwise documented below in the visit note. 

## 2015-04-17 ENCOUNTER — Telehealth (HOSPITAL_COMMUNITY): Payer: Self-pay | Admitting: Clinical

## 2015-04-17 NOTE — Telephone Encounter (Signed)
Client and clinician meet 04/17/15 and will discuss this further. Scharlene shared that she will also be discussing the topic with psychologist next week.   Aldene called an left a message she was feeling like self harming, she stated she was having suicidal ideations "putting a knife in my head."  She stated that she did not want to do anything stupid so whe was going for a walk. Stated she was not depressed  returned called. She said she did not self harm and was feeling better.  suggested if she should feel that way again that she follow her safety plan or go to the emergency room.   suggested that she may need a higher level care and could refer her if she like Client and clinician meet 04/17/15 and will discuss this further. Aubriana shared that she will also be discussing the topic with psychologist next week.

## 2015-04-18 ENCOUNTER — Encounter (HOSPITAL_COMMUNITY): Payer: Self-pay | Admitting: Clinical

## 2015-04-18 ENCOUNTER — Ambulatory Visit (INDEPENDENT_AMBULATORY_CARE_PROVIDER_SITE_OTHER): Payer: Medicare Other | Admitting: Clinical

## 2015-04-18 DIAGNOSIS — F331 Major depressive disorder, recurrent, moderate: Secondary | ICD-10-CM | POA: Diagnosis not present

## 2015-04-18 DIAGNOSIS — F3160 Bipolar disorder, current episode mixed, unspecified: Secondary | ICD-10-CM

## 2015-04-18 DIAGNOSIS — F603 Borderline personality disorder: Secondary | ICD-10-CM | POA: Diagnosis not present

## 2015-04-18 NOTE — Progress Notes (Signed)
THERAPIST PROGRESS NOTE  Session Time: 10:00-10:55  Participation Level: Active  Behavioral Response: CasualAlertAngry  Type of Therapy: Individual Therapy  Treatment Goals addressed: improve psychiatric symptoms, Improve unhelpful thinking patterns, self-care skills, and reduce unhealthy behaviors/increase healthy behaviors  Interventions: CBT and Motivational Interviewing, Grounding and Mindfulness Techniques  Summary: Anaysia Germer is a 49 y.o. female who presents with Bipolar I disorder, most recent episode (or current) unspecified, Borderline Personality Disorder  Suicidal/Homicidal: No -without intent/plan  Therapist Response: Paisley met with clinician for an individual session. Yvana discussed his psychiatric symptoms, her current life events and her homework. She shared that she was really frustrated and feeling hopeless. Client and clinician discussed the phone message that she had left on clinicians phone where she stated that she was having thoughts of self harming behaviors and had been thinking of sticking a knife in her head. She had stated on the message  that she would not harm herself and clinician had called her back to check on her and reminder her of her safety plan and steps she could take to protect herself. Clinician suggested as she did on her return phone call. If client needed a higher level of care then a referral could be made.Client and clinician discussed her thought process. Erendida shared that she was very frustrated and all her relationships including with her children and with clinician. She shared that she was frustrated because she could not see the clinician is often as she liked.  Nurse had informed Samiksha(prior to session) about a possible DBT group at [facility] that she could attend. Bambie shared that she felt like nobody cared about her. Clinician encouraged Candela to look at the evidence for and against that belief Azyah was able to recognize that  others cared about her but were not necessarily constantly available to her. Ellon shared that while she did not cut or harm herself in that way she did get drunk last night after telling a fib to her son to get money to buy alcohol. Client and clinician discussed this act and the outcome. Client and clinician discussed healthy and unhealthy behaviors. Dioselina shared that she felt like nothing she did was helping her get better. She shared that she kept asking herself such things as  "why do I always feel bad." and "why doesn't anybody by care about me" and "why does life suck.". Client and clinician discussed how our brain looks for evidence and how this effects our emotions. Clinician suggests she improve the quality of her  Questions to improve her answers - asking such things as  "what could I do to improve my situation" and  "Who do I care about and what can I do to a lot let them know" and "what are the positive things in my life." Client and clinician discussed how our thoughts work. Client and clinician worked some of these questions together. Alvilda was able to recognize that there were several positives in her life and that if she focused therapy or mood would improve. Phiona shared that she understood and  that she still felt frustrated with clinician. Clinician shared with Leaha that Brantleigh deserved to receive therapy that she felt was most beneficial to her and if she felt clinician wasn't a good match then clinician would be refer her to somewhere that would be better suited. Brook denied any suicidal or homicidal ideation.   Plan: Return again in 1 -2 weeks.  Diagnosis: Axis I:   Bipolar I disorder, most recent  episode (or current) unspecified, Borderline Personality Disorder    Powell,Frances A, LCSW 04/18/2015

## 2015-04-22 ENCOUNTER — Telehealth (HOSPITAL_COMMUNITY): Payer: Self-pay

## 2015-04-22 NOTE — Telephone Encounter (Signed)
Medication management - Called 911 after [name], therapist received a call patient had overdosed on neurontin and "was drinking vodka".  [name], therapist requested this nurse call to request a safety check as she was with another patient.  Called 911 and spoke to office Adam, [phone] who took the limited information was had and agreed to send out police and EMS for a safety check on patient's status this date.  [place] police to call back if any questions or with updates if available.

## 2015-04-24 ENCOUNTER — Telehealth: Payer: Self-pay | Admitting: Internal Medicine

## 2015-04-24 ENCOUNTER — Ambulatory Visit (INDEPENDENT_AMBULATORY_CARE_PROVIDER_SITE_OTHER): Payer: Medicare Other | Admitting: Psychiatry

## 2015-04-24 DIAGNOSIS — F603 Borderline personality disorder: Secondary | ICD-10-CM | POA: Diagnosis not present

## 2015-04-24 DIAGNOSIS — M79672 Pain in left foot: Secondary | ICD-10-CM

## 2015-04-24 MED ORDER — GABAPENTIN 300 MG PO CAPS: ORAL_CAPSULE | ORAL | Status: DC

## 2015-04-24 NOTE — Telephone Encounter (Signed)
10.5.16 Pt came in to inquire about a referral to a podiatrist for left foot. States she is in a lot of pain. Please contact by phone with plans for referral. MS

## 2015-04-24 NOTE — Progress Notes (Signed)
[facility] MD Progress Note  04/24/2015 3:49 PM Morgan [name]  MRN:  [id number] Subjective: Up and down Principal Problem: Borderline personality disorder Diagnosis:  Borderline personality disorder Today the patient says she has fluctuations in her mood state. She says she really doesn't feel depressed at all. She never has persistent daily depression and she really doesn't describe persistent euphoria. She has bouts where she is irritable. Her mood shifts back-and-forth to be out of touch with herself. Patient says she's made 3 suicide attempts in the last 12 months but doesn't feel suicidal at this time. The patient says she's been impulsive. She says she started to drink a lot of vodka. Her last drink was about 3 or 4 days ago. The patient says she's also been spending a lot. But in a close examination checked his spent money on watch it isn't exercise bit. As result she's been walking a lot and even lost a little weight. She is very inconsistent about what she wants. She is upset with the setting here because she can't be seen on a regular basis and I suspect consistency is extremely important in the care of this patient. She wishes to be seen regular weekly basis. Today reviewed all her labs. It is noted that all of them are normal. Her thyroids are stable and she shows no signs based upon her last of an arthritic condition. That is her AMA was normal and her sedimentation rate was normal area her kidneys are fine and she is not anemic. Overall her blood work was good. The patient denies the use of drugs. She does acknowledge that she's drinking too much. Things seem to be going okay with her water lives with her. The patient has done nothing overtly self-destructive like cutting on herself. Patient Active Problem List   Diagnosis Date Noted  . Scalp lesion [L98.9] 04/15/2015  . Bipolar disorder ([place]) [F31.9] 04/15/2015  . Knee pain, bilateral [M25.561, M25.562] 03/05/2015  . Chest pain [R07.9]  03/05/2015   Total Time spent with patient: 30 minutes  Past Psychiatric History:   Past Medical History:  Past Medical History  Diagnosis Date  . Asthma   . IBS (irritable bowel syndrome) 1995  . Arthritis   . Degenerative disorder of bone   . Bulging lumbar disc 07/19/05  . Bipolar disorder   . History of borderline personality disorder   . Anxiety   . Hypothyroidism 2007    Developed after use of Lithium    Past Surgical History  Procedure Laterality Date  . Cholecystectomy    . Tubal ligation    . Abdominal hysterectomy    . Shoulder open rotator cuff repair Right 03/2010  . Bunionectomy Right 06/2012  . Appendectomy     Family History:  Family History  Problem Relation Age of Onset  . Depression Mother   . Hypertension Mother   . Post-traumatic stress disorder Sister   . Alcohol abuse Brother   . Drug abuse Brother   . Post-traumatic stress disorder Brother   . Thyroid disease Father   . Alzheimer's disease Father   . COPD Maternal Grandmother   . Heart disease Maternal Grandfather   . Arthritis Paternal Grandmother   . Diabetes Paternal Grandmother   . Depression Paternal Grandmother   . Alzheimer's disease Paternal Grandmother   . Heart disease Paternal Grandfather   . Coronary artery disease Paternal Grandfather   . Alcohol abuse Paternal Grandfather    Family Psychiatric  History:  Social History:  History  Alcohol Use  . 0.0 oz/week  . 0 Standard drinks or equivalent per week    Comment: occ     History  Drug Use No    Social History   Social History  . Marital Status: Divorced    Spouse Name: N/A  . Number of Children: 2  . Years of Education: 14   Occupational History  . Unemployed    Social History Main Topics  . Smoking status: Former Smoker    Quit date: 07/20/1980  . Smokeless tobacco: Never Used  . Alcohol Use: 0.0 oz/week    0 Standard drinks or equivalent per week     Comment: occ  . Drug Use: No  . Sexual Activity: No      Comment: intercourse age unknown,sexual partners less than 5   Other Topics Concern  . Not on file   Social History Narrative   Fun: [place], travel, music, writing, walking, hiking, volunteering   Denies religious beliefs effecting health care.    Feels safe at home.    Additional Social History:                         Sleep: Good  Appetite:  Good  Current Medications: Current Outpatient Prescriptions  Medication Sig Dispense Refill  . albuterol (PROVENTIL) (5 MG/ML) 0.5% nebulizer solution Take 2.5 mg by nebulization every 6 (six) hours as needed for wheezing or shortness of breath.     . cetirizine (ZYRTEC) 10 MG tablet Take 10 mg by mouth as needed.     . diclofenac sodium (VOLTAREN) 1 % GEL Apply 2 g topically 3 (three) times daily as needed. 100 g 1  . gabapentin (NEURONTIN) 300 MG capsule 1  bid 60 capsule 4  . hyoscyamine (LEVSIN, ANASPAZ) 0.125 MG tablet Take 0.125 mg by mouth as needed for cramping.     Marland Kitchen ibuprofen (ADVIL,MOTRIN) 100 MG tablet Take 400-800 mg by mouth every 6 (six) hours as needed for pain.    Marland Kitchen LORazepam (ATIVAN) 0.5 MG tablet 1  qday  prn (Patient taking differently: Take 0.5 mg by mouth daily as needed for anxiety. ) 10 tablet 1   No current facility-administered medications for this visit.    Lab Results: No results found for this or any previous visit (from the past 48 hour(s)).  Physical Findings: AIMS:  , ,  ,  ,    CIWA:    COWS:     Musculoskeletal: Strength & Muscle Tone: within normal limits Gait & Station: normal Patient leans: Right  Psychiatric Specialty Exam: ROS  There were no vitals taken for this visit.There is no weight on file to calculate BMI.  General Appearance: Casual  Eye Contact::  Good  Speech:  Clear and Coherent  Volume:  Normal  Mood:  Euthymic  Affect:  Appropriate  Thought Process:  Coherent  Orientation:  Full (Time, Place, and Person)  Thought Content:  WDL  Suicidal Thoughts:  No   Homicidal Thoughts:  No  Memory:  NA  Judgement:  Good  Insight:  Good  Psychomotor Activity:  Normal  Concentration:  Good  Recall:  Good  Fund of Knowledge:Good  Language: Good  Akathisia:  No  Handed:  Right  AIMS (if indicated):     Assets:  Desire for Improvement  ADL's:  Intact  Cognition: WNL  Sleep:      Treatment Plan Summary: At this time the patient will increase her Neurontin from  200 mg twice a day to dose of 300 mg twice a day. This is for mood stabilization and ALSO to try to be preventive from drinking too much alcohol. I do not think this patient is acutely suicidal. I wish not to overreact to her. Her session went on a normal amount of time and encouraged her to stay in therapy and to see her therapist tomorrow at her scheduled appointment. I think this patient actually stable. We did get to talk about her work but overall I think she staying active. Again I do not believe this person is self-destructive at this time. I think she is having an issue with alcohol.  Journee Kohen, [place] 04/24/2015, 3:49 PM

## 2015-04-25 ENCOUNTER — Ambulatory Visit (INDEPENDENT_AMBULATORY_CARE_PROVIDER_SITE_OTHER): Payer: Medicare Other | Admitting: Internal Medicine

## 2015-04-25 ENCOUNTER — Encounter: Payer: Self-pay | Admitting: Internal Medicine

## 2015-04-25 ENCOUNTER — Ambulatory Visit (INDEPENDENT_AMBULATORY_CARE_PROVIDER_SITE_OTHER): Payer: Medicare Other | Admitting: Clinical

## 2015-04-25 ENCOUNTER — Encounter (HOSPITAL_COMMUNITY): Payer: Self-pay | Admitting: Clinical

## 2015-04-25 VITALS — BP 102/62 | HR 70 | Temp 98.3°F | Resp 16 | Ht 63.5 in | Wt 171.8 lb

## 2015-04-25 DIAGNOSIS — F603 Borderline personality disorder: Secondary | ICD-10-CM | POA: Diagnosis not present

## 2015-04-25 DIAGNOSIS — F3160 Bipolar disorder, current episode mixed, unspecified: Secondary | ICD-10-CM

## 2015-04-25 DIAGNOSIS — M25562 Pain in left knee: Secondary | ICD-10-CM | POA: Diagnosis not present

## 2015-04-25 DIAGNOSIS — M25561 Pain in right knee: Secondary | ICD-10-CM | POA: Diagnosis not present

## 2015-04-25 MED ORDER — DICLOFENAC SODIUM 50 MG PO TBEC: 50.0000 mg | DELAYED_RELEASE_TABLET | Freq: Two times a day (BID) | ORAL | Status: DC

## 2015-04-25 NOTE — Progress Notes (Signed)
THERAPIST PROGRESS NOTE  Session Time: 7:05 - 8:02  Participation Level: Active  Behavioral Response: CasualAlertAngry and at points and calm and happy at other  Type of Therapy: Individual Therapy  Treatment Goals addressed: improve psychiatric symptoms, Improve unhelpful thinking patterns, self-care skills, and reduce unhealthy behaviors/increase healthy behaviors  Interventions: CBT and Motivational Interviewing, Grounding and Mindfulness Techniques  Summary: [name] is a 49 y.o. female who presents with Bipolar I disorder, most recent episode (or current) unspecified, Borderline Personality Disorder  Suicidal/Homicidal: No -without intent/plan  Therapist Response: Zoriana met with clinician for an individual session. Markeda discussed his psychiatric symptoms, her current life events and her homework. She had left a message this past week that she had drank and took pills. Clinician heard the message but was with a client and so asked the nurse to call and have a safety check done on [name]. The police did so but Darla didn't answer the door. Keirra shared that she had seen the police but did not answer the door. Danayah told clinician that she did not remember calling clinician. Clinician reminded her of her emergency numbers and informed [name] that if she left a similar message on clinician's phone then the police would again be called and they may force entry. Nely shared her thoughts and emotions that led to her behavior. Clinician asked what healthy coping skills [name] could use instead of drinking.[name] listed several. Client and clinician discussed the pros and cons of using healthy vs unhealthy behaviors. [name] shared that she still has alcohol hidden at home. Client and clinician discussed her motivation for having it. [name] shared her thoughts and insights about her unhealthy behavior. [name] and clinician discussed how her behaviors lined up with her goal  of self care.  [name] denied any current suicidal or homicidal ideation. She shared that she does have the emergency contact numbers.   Plan: Return again in 1 weeks.  Diagnosis: Axis I:   Bipolar I disorder, most recent episode (or current) unspecified, Borderline Personality Disorder    , A, LCSW 04/25/2015  

## 2015-04-25 NOTE — Progress Notes (Signed)
Pre visit review using our clinic review tool, if applicable. No additional management support is needed unless otherwise documented below in the visit note. 

## 2015-04-25 NOTE — Patient Instructions (Signed)
We have sent in voltaren pills (diclofenac) that you can take 1 pill twice a day for the pain.   We will get you in with there sports medicine doctor, Dr. Tamala Julian to use the ultrasound to find the problem.

## 2015-04-25 NOTE — Telephone Encounter (Signed)
Referral placed.

## 2015-04-28 NOTE — Assessment & Plan Note (Signed)
Will try voltaren oral since her insurance would not cover voltaren gel. She does not want steroids of any kind as they exacerbate her mania and she is slightly manic already at this time. Referral to sports medicine so she can find the cause and get a good stretching regimen to help.

## 2015-04-28 NOTE — Progress Notes (Signed)
   Subjective:    Patient ID: Morgan [name], female    DOB: 10-22-1965, 49 y.o.   MRN: [id number]  HPI The patient is a 49 YO female coming in for worsening right knee pain. She was not able to get the voltaren gel from last visit as her insurance would not cover it. She has still been using some ibuprofen or tylenol for the pain. She is concerned as it is giving out on her and she wants to know what is causing it. She does normally walk 5 miles per day and previously it was only hurting at rest but is now hurting with activity.   Review of Systems  Constitutional: Negative for fever, activity change, appetite change, fatigue and unexpected weight change.  Respiratory: Negative for cough, chest tightness, shortness of breath and wheezing.   Cardiovascular: Negative for palpitations and leg swelling.  Gastrointestinal: Negative for nausea, abdominal pain, diarrhea, constipation and abdominal distention.  Musculoskeletal: Positive for arthralgias and gait problem. Negative for myalgias and back pain.  Neurological: Negative for dizziness, syncope, weakness, light-headedness and headaches.  Psychiatric/Behavioral: Positive for dysphoric mood and decreased concentration. Negative for hallucinations and sleep disturbance. The patient is nervous/anxious.       Objective:   Physical Exam  Constitutional: She is oriented to person, place, and time. She appears well-developed and well-nourished.  HENT:  Head: Normocephalic and atraumatic.  Eyes: EOM are normal.  Neck: Normal range of motion.  Cardiovascular: Normal rate and regular rhythm.   Pulmonary/Chest: Effort normal. No respiratory distress. She has no wheezes.  Abdominal: Soft. She exhibits no distension. There is no tenderness. There is no rebound.  Musculoskeletal: She exhibits tenderness.  Tenderness along the superior aspect of the patella, ACL and PCL intact right knee.  Neurological: She is alert and oriented to person, place, and  time.  Skin: Skin is warm and dry.   Filed Vitals:   04/25/15 1502  BP: 102/62  Pulse: 70  Temp: 98.3 F (36.8 C)  TempSrc: Oral  Resp: 16  Height: 5' 3.5" (1.613 m)  Weight: 171 lb 12.8 oz (77.928 kg)  SpO2: 98%      Assessment & Plan:

## 2015-04-30 ENCOUNTER — Encounter (HOSPITAL_COMMUNITY): Payer: Self-pay | Admitting: Clinical

## 2015-04-30 ENCOUNTER — Ambulatory Visit (INDEPENDENT_AMBULATORY_CARE_PROVIDER_SITE_OTHER): Payer: Medicare Other | Admitting: Clinical

## 2015-04-30 DIAGNOSIS — F603 Borderline personality disorder: Secondary | ICD-10-CM | POA: Diagnosis not present

## 2015-04-30 DIAGNOSIS — F3160 Bipolar disorder, current episode mixed, unspecified: Secondary | ICD-10-CM | POA: Diagnosis not present

## 2015-04-30 NOTE — Progress Notes (Signed)
   THERAPIST PROGRESS NOTE  Session Time: 7:02 -802  Participation Level: Active  Behavioral Response: CasualAlertAnxious  Type of Therapy: Individual Therapy  Treatment Goals addressed: improve psychiatric symptoms, Improve unhelpful thinking patterns, self-care skills, and reduce unhealthy behaviors/increase healthy behaviors  Interventions: CBT and Motivational Interviewing, Grounding and Mindfulness Techniques  Summary: Morgan [name] is a 49 y.o. female who presents with Bipolar I disorder, most recent episode (or current) unspecified, Borderline Personality Disorder  Suicidal/Homicidal: No -without intent/plan  Therapist Response: Morgan [name] met with clinician for an individual session. Morgan [name] discussed his psychiatric symptoms and  her current life events. She shared that she felt like she was becoming more balanced. She stated that she felt calmer and more focused. Morgan [name] shared that she no longer has alcohol hidden in her house. She shared that she was able to use better coping skills this week.Morgan [name] and clinician discussed her victories. She shared about visiting with others instead of going to drink. Morgan [name] shared that she worked hard to recognize when others were bored with her conversation. Client and clinician discussed her conversations with others. Morgan [name] shared that she has a history of overwhelming others by discussing her problems and process. Morgan [name] and clinician discussed her tendency towards focusing on her "wounds". Morgan [name] and clinician discussed her doing activities that were not so internally focused such as volunteering. She shared she had a desire to help others. Client and clinician discussed some options for doing so. Morgan [name] had an insight that she was attached to her "wounds." clinician asked open ended questions about her insight. Morgan [name] shared that when she was progressing or thought about giving them up she had thoughts of harming herself. Client and  clinician used cbt techniques to examine her self harm thoughts. Morgan [name] and clinician discussed self care and healthy behaviors. Morgan [name] and clinician practiced a grounding technique together. Morgan [name] denied any suicidal or homicidal ideation   Plan: Return again in 1 weeks.  Diagnosis: Axis I:   Bipolar I disorder, most recent episode (or current) unspecified, Borderline Personality Disorder         Dontasia Miranda A, LCSW 04/30/2015

## 2015-05-02 ENCOUNTER — Other Ambulatory Visit: Payer: Self-pay | Admitting: *Deleted

## 2015-05-02 ENCOUNTER — Other Ambulatory Visit (INDEPENDENT_AMBULATORY_CARE_PROVIDER_SITE_OTHER): Payer: Medicare Other

## 2015-05-02 ENCOUNTER — Ambulatory Visit (INDEPENDENT_AMBULATORY_CARE_PROVIDER_SITE_OTHER): Payer: Medicare Other | Admitting: Family Medicine

## 2015-05-02 ENCOUNTER — Telehealth: Payer: Self-pay | Admitting: Internal Medicine

## 2015-05-02 ENCOUNTER — Encounter: Payer: Self-pay | Admitting: Family Medicine

## 2015-05-02 VITALS — BP 112/70 | HR 61 | Ht 63.5 in | Wt 170.0 lb

## 2015-05-02 DIAGNOSIS — M222X1 Patellofemoral disorders, right knee: Secondary | ICD-10-CM | POA: Insufficient documentation

## 2015-05-02 DIAGNOSIS — M222X2 Patellofemoral disorders, left knee: Secondary | ICD-10-CM

## 2015-05-02 DIAGNOSIS — M25561 Pain in right knee: Secondary | ICD-10-CM

## 2015-05-02 MED ORDER — VITAMIN D2 50 MCG (2000 UT) PO TABS: ORAL_TABLET | ORAL | Status: DC

## 2015-05-02 NOTE — Patient Instructions (Signed)
Good to see you.  Ice 20 minutes 2 times daily. Usually after activity and before bed. Exercises 3 times a week.  pennsaid pinkie amount topically 2 times daily as needed.  Turmeric 500mg  twice daily Vitamin D 2000 IU daily Walk on softer surfaces.  Come back in 4 weeks.

## 2015-05-02 NOTE — Assessment & Plan Note (Signed)
Patellofemoral Syndrome  Reviewed anatomy using anatomical model and how PFS occurs.  Given rehab exercises handout for VMO, hip abductors, core, entire kinetic chain including proprioception exercises including cone touches, step downs, hip elevations and turn outs.  Could benefit from PT, regular exercise, upright biking, and a PFS knee brace to assist with tracking abnormalities. RTC in 4 weeks  

## 2015-05-02 NOTE — Telephone Encounter (Signed)
Pt called stated that pennsaid pinkie amount topically 2 times daily as needed. Is not at the [facility] (Dr, Tamala Julian should have the number). Please check and resent it

## 2015-05-02 NOTE — Progress Notes (Signed)
[name] [address], [place] [address] Phone: [phone] Subjective:    I'm seeing this patient by the request  of:  Hoyt Koch, MD   CC: Right knee pain  ESP:[id number] Morgan [name] is a 49 y.o. female coming in with complaint of right knee pain. Has been 4-5 months. Has had some foot surgery within the lash area that she thinks is contribute in. Patient states most of pain seems to be on the anterior lateral aspect of the knee. Sometimes on the posterior aspect. Hurts her when she is doing her exercises which is walking regularly. Patient is trying to walk 5-6 miles daily. Patient denies any significant pain with regular activity or sitting. Denies any nighttime pain. Mild radiation down the leg but nothing that stops her from activity. Denies any numbness. Rates the severity of pain as 7 out of 10. Denies any significant instability of the knee. Starting to affect her contralateral knee as well.  Past Medical History  Diagnosis Date  . Asthma   . IBS (irritable bowel syndrome) 1995  . Arthritis   . Degenerative disorder of bone   . Bulging lumbar disc 07/19/05  . Bipolar disorder ([place])   . History of borderline personality disorder   . Anxiety   . Hypothyroidism 2007    Developed after use of Lithium   Past Surgical History  Procedure Laterality Date  . Cholecystectomy    . Tubal ligation    . Abdominal hysterectomy    . Shoulder open rotator cuff repair Right 03/2010  . Bunionectomy Right 06/2012  . Appendectomy     Social History  Substance Use Topics  . Smoking status: Former Smoker    Quit date: 07/20/1980  . Smokeless tobacco: Never Used  . Alcohol Use: 0.0 oz/week    0 Standard drinks or equivalent per week     Comment: occ   Allergies  Allergen Reactions  . Abilify [Aripiprazole] Swelling    Throat begins to swell   . Ciprofloxacin Shortness Of Breath  . Lamictal [Lamotrigine] Rash  . Amoxicillin  Hives  . Doxycycline Hives  . Latex Rash  . Meloxicam Other (See Comments)    Induces mania  . Mirapex [Pramipexole] Hives  . Prednisone Other (See Comments)    Interacts with Lithium. " All steroids"  . Risperdal [Risperidone] Hives  . Tape Rash    "steri strips"  . Cortisone     Exacerbates the mania of her bipolar  . Sulfa Antibiotics Rash   Family History  Problem Relation Age of Onset  . Depression Mother   . Hypertension Mother   . Post-traumatic stress disorder Sister   . Alcohol abuse Brother   . Drug abuse Brother   . Post-traumatic stress disorder Brother   . Thyroid disease Father   . Alzheimer's disease Father   . COPD Maternal Grandmother   . Heart disease Maternal Grandfather   . Arthritis Paternal Grandmother   . Diabetes Paternal Grandmother   . Depression Paternal Grandmother   . Alzheimer's disease Paternal Grandmother   . Heart disease Paternal Grandfather   . Coronary artery disease Paternal Grandfather   . Alcohol abuse Paternal Grandfather         Past medical history, social, surgical and family history all reviewed in electronic medical record.   Review of Systems: No headache, visual changes, nausea, vomiting, diarrhea, constipation, dizziness, abdominal pain, skin rash, fevers, chills, night sweats, weight loss, swollen lymph  nodes, body aches, joint swelling, muscle aches, chest pain, shortness of breath, mood changes.   Objective Blood pressure 112/70, pulse 61, height 5' 3.5" (1.613 m), weight 170 lb (77.111 kg), SpO2 97 %.  General: No apparent distress alert and oriented x3 mood and affect normal, dressed appropriately.  HEENT: Pupils equal, extraocular movements intact  Respiratory: Patient's speak in full sentences and does not appear short of breath  Cardiovascular: No lower extremity edema, non tender, no erythema  Skin: Warm dry intact with no signs of infection or rash on extremities or on axial skeleton.  Abdomen: Soft nontender    Neuro: Cranial nerves II through XII are intact, neurovascularly intact in all extremities with 2+ DTRs and 2+ pulses.  Lymph: No lymphadenopathy of posterior or anterior cervical chain or axillae bilaterally.  Gait normal with good balance and coordination.  MSK:  Non tender with full range of motion and good stability and symmetric strength and tone of shoulders, elbows, wrist, hip, and ankles bilaterally.  Knee: Right Normal to inspection with no erythema or effusion or obvious bony abnormalities. Palpation shows the patient is tender to palpation over the superior lateral patellofemoral joint mild lateral tracking of the patella noted ROM full in flexion and extension and lower leg rotation. Ligaments with solid consistent endpoints including ACL, PCL, LCL, MCL. Negative Mcmurray's, Apley's, and Thessalonian tests. painful patellar compression. Patellar glide with moderate crepitus. Patellar and quadriceps tendons unremarkable. Hamstring and quadriceps strength is normal.  Contralateral knee also has lateral tracking.  MSK US performed of: Right knee This study was ordered, performed, and interpreted by Charlann Boxer D.O.  Knee: All structures visualized. Anteromedial, anterolateral, posteromedial, and posterolateral menisci unremarkable without tearing, fraying, effusion, or displacement. Patellofemoral joint though does have some narrowing noted of the superior lateral aspect with some mild spurring.. Mild trace effusion noted in this area. Patellar Tendon unremarkable on long and transverse views without effusion. No abnormality of prepatellar bursa. LCL and MCL unremarkable on long and transverse views. No abnormality of origin of medial or lateral head of the gastrocnemius.  IMPRESSION:  Mild to moderate patellofemoral arthritis  Procedure note 22336; 15 minutes spent for Therapeutic exercises as stated in above notes.  This included exercises focusing on stretching,  strengthening, with significant focus on eccentric aspects. Vision in flexion and extension exercises focusing on vastus medialis oblique as well as hip abductor strengthening. Discussed eccentric exercises as well as balance and stabilization.   Proper technique shown and discussed handout in great detail with ATC.  All questions were discussed and answered.     Impression and Recommendations:     This case required medical decision making of moderate complexity.

## 2015-05-02 NOTE — Progress Notes (Signed)
Pre visit review using our clinic review tool, if applicable. No additional management support is needed unless otherwise documented below in the visit note. 

## 2015-05-02 NOTE — Addendum Note (Signed)
Addended by: [name] on: 05/02/2015 08:34 AM   Modules accepted: Miquel Dunn

## 2015-05-02 NOTE — Telephone Encounter (Signed)
Refill done.  

## 2015-05-06 ENCOUNTER — Other Ambulatory Visit: Payer: Self-pay | Admitting: *Deleted

## 2015-05-06 ENCOUNTER — Telehealth: Payer: Self-pay | Admitting: Internal Medicine

## 2015-05-06 ENCOUNTER — Encounter (HOSPITAL_COMMUNITY): Payer: Self-pay | Admitting: Clinical

## 2015-05-06 ENCOUNTER — Ambulatory Visit (INDEPENDENT_AMBULATORY_CARE_PROVIDER_SITE_OTHER): Payer: Medicare Other | Admitting: Clinical

## 2015-05-06 DIAGNOSIS — F3160 Bipolar disorder, current episode mixed, unspecified: Secondary | ICD-10-CM

## 2015-05-06 DIAGNOSIS — F603 Borderline personality disorder: Secondary | ICD-10-CM | POA: Diagnosis not present

## 2015-05-06 MED ORDER — VITAMIN D (ERGOCALCIFEROL) 1.25 MG (50000 UNIT) PO CAPS: 50000.0000 [IU] | ORAL_CAPSULE | ORAL | Status: DC

## 2015-05-06 MED ORDER — DICLOFENAC SODIUM 2 % TD SOLN: TRANSDERMAL | Status: DC

## 2015-05-06 NOTE — Telephone Encounter (Signed)
Refill done.  

## 2015-05-06 NOTE — Progress Notes (Signed)
   THERAPIST PROGRESS NOTE  Session Time: 10:05 -11:00  Participation Level: Active  Behavioral Response: CasualAlertDepressed  Type of Therapy: Individual Therapy  Treatment Goals addressed: improve psychiatric symptoms, Improve unhelpful thinking patterns, and reduce unhealthy behaviors/increase healthy behaviors  Interventions: CBT and Motivational Interviewing,   Summary: Morgan [name] is [name] 49 y.o. female who presents with Bipolar I disorder, most recent episode (or current) unspecified, Borderline Personality Disorder  Suicidal/Homicidal: No -without intent/plan  Therapist Response: Morgan [name] met with Morgan [name] for an individual session. Morgan [name] discussed his psychiatric symptoms, her current life events and her homework. Morgan [name] shared that she did not have any self harming behaviors out right she did not cut herself or drink. She shared that she was given [name] prescription her leg and that she found herself wanting to take more than prescribed. She stated that she disposed of that medication. She statedthat she called the suicide prevention hotline on Friday and she said that she was feeling self-destructive. When asked the details [name] bout what had triggered feeling self-destructive or what action she thought she might take Morgan [name] was avoidant of the conversation, saying she didn't know. She shared that over the past week she had gotten honest with her daughter which meant that she confessed that she had lied to her or told her daughter why she was upset with her. She reports that she is less manic. She said "I think things through before doing them rather than reflecting after." She reported she is going to retreat this weekend and is working to feel worthy of doing so. she stated that the thing that keeps her grounded is her relationship with God and that she does not want to go to hell. Morgan [name] said that she would like to go back to doing inner bonding work. Morgan [name] informed her that  Morgan [name] was not trained as an inner Holiday representative. Morgan [name] voiced concerns over delving into past trauma while Morgan [name] is exhibiting self harming behaviors on [name] weekly basis. Morgan [name] suggests that the focus of therapy be instead on building [name] foundation of self care and healthy coping skills so that he would in the future if she liked be able to talk [name] bout past trauma with out the aftermath of self harming. Morgan [name] asked what activities Morgan [name] does that are not focused internally or on the past. Morgan [name] listed her activities all of which are internal except visiting with friends . Morgan [name] shared that she works to have the conversation about them, but it does sometimes focus on her. Client and Morgan [name] discussed balance, and healthy verses unhealthy behaviors.   Morgan [name] denied any suicidal or homicidal ideation.  Plan: Return again in 1 weeks.  Diagnosis: Axis I:   Bipolar I disorder, most recent episode (or current) unspecified, Borderline Personality Disorder   Morgan Lakatos A, LCSW 05/06/2015

## 2015-05-06 NOTE — Telephone Encounter (Signed)
Fine with me

## 2015-05-06 NOTE — Telephone Encounter (Signed)
Dr burns- would you be ok with accepting this patient under your care? Patient felt that she didn't have a very good chemistry with dr Sharlet Salina.   Dr. Sharlet Salina- are you ok with this patient xferring ?

## 2015-05-06 NOTE — Telephone Encounter (Signed)
Ok with me 

## 2015-05-08 ENCOUNTER — Encounter: Payer: Self-pay | Admitting: Podiatry

## 2015-05-08 ENCOUNTER — Ambulatory Visit (INDEPENDENT_AMBULATORY_CARE_PROVIDER_SITE_OTHER): Payer: Medicare Other

## 2015-05-08 ENCOUNTER — Ambulatory Visit (INDEPENDENT_AMBULATORY_CARE_PROVIDER_SITE_OTHER): Payer: Medicare Other | Admitting: Podiatry

## 2015-05-08 VITALS — BP 117/74 | HR 63 | Resp 16 | Ht 63.5 in | Wt 169.0 lb

## 2015-05-08 DIAGNOSIS — M205X2 Other deformities of toe(s) (acquired), left foot: Secondary | ICD-10-CM

## 2015-05-08 DIAGNOSIS — M779 Enthesopathy, unspecified: Secondary | ICD-10-CM | POA: Diagnosis not present

## 2015-05-08 DIAGNOSIS — M21619 Bunion of unspecified foot: Secondary | ICD-10-CM

## 2015-05-08 NOTE — Progress Notes (Signed)
   Subjective:    Patient ID: Morgan [name], female    DOB: [birth year], 49 y.o.   MRN: [id number]  HPI Patient presents with bilateral foot pain; bunions. Pt stated, "more pain in left foot". Pt has history of bunion surgery on Right foot-re-set bone Dec. 27, 2013 in [place], [place]. Right foot-looks red at incision site; x3 months.   Review of Systems  HENT: Positive for sinus pressure.   Respiratory: Positive for shortness of breath.   Musculoskeletal: Positive for back pain, arthralgias and gait problem.  Allergic/Immunologic: Positive for environmental allergies and food allergies.  Neurological: Positive for headaches.  All other systems reviewed and are negative.      Objective:   Physical Exam        Assessment & Plan:

## 2015-05-08 NOTE — Patient Instructions (Signed)
Pre-Operative Instructions  Congratulations, you have decided to take an important step to improving your quality of life.  You can be assured that the doctors of [facility] will be with you every step of the way.  1. Plan to be at the surgery center/hospital at least 1 (one) hour prior to your scheduled time unless otherwise directed by the surgical center/hospital staff.  You must have a responsible adult accompany you, remain during the surgery and drive you home.  Make sure you have directions to the surgical center/hospital and know how to get there on time. 2. For hospital based surgery you will need to obtain a history and physical form from your family physician within 1 month prior to the date of surgery- we will give you a form for you primary physician.  3. We make every effort to accommodate the date you request for surgery.  There are however, times where surgery dates or times have to be moved.  We will contact you as soon as possible if a change in schedule is required.   4. No Aspirin/Ibuprofen for one week before surgery.  If you are on aspirin, any non-steroidal anti-inflammatory medications (Mobic, Aleve, Ibuprofen) you should stop taking it 7 days prior to your surgery.  You make take Tylenol  For pain prior to surgery.  5. Medications- If you are taking daily heart and blood pressure medications, seizure, reflux, allergy, asthma, anxiety, pain or diabetes medications, make sure the surgery center/hospital is aware before the day of surgery so they may notify you which medications to take or avoid the day of surgery. 6. No food or drink after midnight the night before surgery unless directed otherwise by surgical center/hospital staff. 7. No alcoholic beverages 24 hours prior to surgery.  No smoking 24 hours prior to or 24 hours after surgery. 8. Wear loose pants or shorts- loose enough to fit over bandages, boots, and casts. 9. No slip on shoes, sneakers are best. 10. Bring  your boot with you to the surgery center/hospital.  Also bring crutches or a walker if your physician has prescribed it for you.  If you do not have this equipment, it will be provided for you after surgery. 11. If you have not been contracted by the surgery center/hospital by the day before your surgery, call to confirm the date and time of your surgery. 12. Leave-time from work may vary depending on the type of surgery you have.  Appropriate arrangements should be made prior to surgery with your employer. 13. Prescriptions will be provided immediately following surgery by your doctor.  Have these filled as soon as possible after surgery and take the medication as directed. 14. Remove nail polish on the operative foot. 15. Wash the night before surgery.  The night before surgery wash the foot and leg well with the antibacterial soap provided and water paying special attention to beneath the toenails and in between the toes.  Rinse thoroughly with water and dry well with a towel.  Perform this wash unless told not to do so by your physician.  Enclosed: 1 Ice pack (please put in freezer the night before surgery)   1 Hibiclens skin cleaner   Pre-op Instructions  If you have any questions regarding the instructions, do not hesitate to call our office.  [place]: [address] [place], [place] [address] [phone]  [place]: [address] [place], [place] [address] [phone]  [place]: [address]  [place], [place] [address] [phone]  Dr. Richard   Tuchman DPM, Dr. Norman Regal DPM Dr. Richard Sikora DPM, Dr. M. Todd Hyatt DPM, Dr. Kathryn Egerton DPM 

## 2015-05-13 NOTE — Progress Notes (Signed)
Subjective:     Patient ID: Morgan [name], female   DOB: Nov 13, 1965, 49 y.o.   MRN: [id number]  HPI patient presents with painful bunion deformity left stating she's been wanting to get it fixed for a long time and history of having had the right one corrected with reduce discomfort within the joint surface but pain still present at times. States the left is been hurting for years and getting worse recently   Review of Systems  All other systems reviewed and are negative.      Objective:   Physical Exam  Constitutional: She is oriented to person, place, and time.  Cardiovascular: Intact distal pulses.   Musculoskeletal: Normal range of motion.  Neurological: She is oriented to person, place, and time.  Skin: Skin is warm.  Nursing note and vitals reviewed.  neurovascular status intact muscle strength adequate range of motion within normal limits with patient noted to have significant loss of motion first MPJ left with approximate 10 dorsiflexion 10 of plantarflexion and no crepitus within the joint with motion. The right shows well-healed surgical scar with motion of approximate 30 dorsiflexion 20 plantarflexion with minimal discomfort when moved. Patient states that the pain has gradually intensified in the left foot and she was noted to have good digital perfusion and was well oriented 3     Assessment:     Hallux limitus rigidus condition left with spur formation and pain consistent with condition and healed surgical site right with occasional discomfort    Plan:     H&P and x-rays of both feet reviewed with patient. We discussed the left foot and different treatment options and she states that she's tried wider shoes she's tried modification of her shoes reduced activity and previous treatments without relief and she wants surgical intervention. I reviewed x-rays indicating spur narrowing the joint surface and did discuss that long-term the possibilities for fusion or joint  implantation are present but we will try to rehabilitate the joint as best as possible. Patient understands this wants surgery and wants to review consent form today due to the year and he with the procedure and I allowed her to read consent form reviewing alternative procedures and complications associated with osteotomy first metatarsal left with spur removal. Patient signed consent form after extensive review and is scheduled for outpatient surgery [facility] specialty surgical center and is given all instructions at this time. Dispensed air fracture walker that I want her to start wearing and I did discuss that recovery will be upwards of 6 months to one year

## 2015-05-15 ENCOUNTER — Encounter (HOSPITAL_COMMUNITY): Payer: Self-pay | Admitting: Clinical

## 2015-05-15 ENCOUNTER — Ambulatory Visit (INDEPENDENT_AMBULATORY_CARE_PROVIDER_SITE_OTHER): Payer: Medicare Other | Admitting: Clinical

## 2015-05-15 DIAGNOSIS — F603 Borderline personality disorder: Secondary | ICD-10-CM

## 2015-05-15 DIAGNOSIS — F319 Bipolar disorder, unspecified: Secondary | ICD-10-CM | POA: Diagnosis not present

## 2015-05-15 NOTE — Progress Notes (Signed)
   THERAPIST PROGRESS NOTE  Session Time: 1:33 - 2:29  Participation Level: Active  Behavioral Response: CasualAlertmixed - happy and then sad, then calm  Type of Therapy: Individual Therapy  Treatment Goals addressed: improve psychiatric symptoms, Improve unhelpful thinking patterns, self-care skills, and reduce unhealthy behaviors/increase healthy behaviors  Interventions: CBT and Motivational Interviewing,   Summary: Morgan [name] is a 49 y.o. female who presents with Bipolar I disorder, most recent episode (or current) unspecified, Borderline Personality Disorder  Suicidal/Homicidal: No -without intent/plan  Therapist Response: Morgan [name] met with clinician for an individual session. Morgan [name] discussed his psychiatric symptoms, her current life events and her homework. She shared that she did not have any self harming behaviors since last session. She shared that she had gone to a religious retreat this past weekend. She shared that while at the retreat her focus was on serving others rather than focusing on herself. She stated that she felt happier and more content over the weekend. Morgan [name] shared that she was beginning to understand the importance of having focus on things outside of her.Morgan [name] and clinician discussed her thoughts about why she felt happier when helping others. Morgan [name] shared that she enjoyed making others happy in ways that did not harm herself. Morgan [name] and clinician discussed the the importance of balance. Morgan [name] had completed a 7 panel thought record sheet on her thoughts about herself. She shared that she thought she was a bad person because of her thoughts and past action. Client and clinician discussed the evidence for and against these thoughts. Clinician pointed out how thoughts are different than actions. The negative actions she had were mostly against herself and that she stopped doing negative actions against others as she was able to recognize the consequences.  When we looked at her negative behaviors to herself, she was able to recognize them as coping skills, poor coping skills. Client and clinician discussed that rather than being bad person, she was just doing things that were not effective in helping her to manage her stress or trauma. Morgan [name] agreed to continue to practice her grounding and mindfulness skills.    Morgan [name] denied any suicidal or homicidal ideation.   Plan: Return again in 1 weeks.  Diagnosis: Axis I:   Bipolar I disorder, most recent episode (or current) unspecified, Borderline Personality Disorder    Trulee Hamstra A, LCSW 05/15/2015

## 2015-05-17 ENCOUNTER — Ambulatory Visit (INDEPENDENT_AMBULATORY_CARE_PROVIDER_SITE_OTHER): Payer: Medicare Other | Admitting: Internal Medicine

## 2015-05-17 ENCOUNTER — Ambulatory Visit (INDEPENDENT_AMBULATORY_CARE_PROVIDER_SITE_OTHER)
Admission: RE | Admit: 2015-05-17 | Discharge: 2015-05-17 | Disposition: A | Discharge status: Home / Self Care | Payer: Medicare Other | Source: Ambulatory Visit | Attending: Internal Medicine | Admitting: Internal Medicine

## 2015-05-17 ENCOUNTER — Encounter: Payer: Self-pay | Admitting: Internal Medicine

## 2015-05-17 VITALS — BP 114/78 | HR 71 | Temp 98.1°F | Resp 16 | Ht 63.5 in | Wt 167.0 lb

## 2015-05-17 DIAGNOSIS — R42 Dizziness and giddiness: Secondary | ICD-10-CM | POA: Diagnosis not present

## 2015-05-17 DIAGNOSIS — R55 Syncope and collapse: Secondary | ICD-10-CM | POA: Insufficient documentation

## 2015-05-17 DIAGNOSIS — M952 Other acquired deformity of head: Secondary | ICD-10-CM | POA: Diagnosis not present

## 2015-05-17 DIAGNOSIS — R51 Headache: Secondary | ICD-10-CM

## 2015-05-17 DIAGNOSIS — R519 Headache, unspecified: Secondary | ICD-10-CM

## 2015-05-17 NOTE — Patient Instructions (Signed)
I ordered an xray of your skull - we will call you with the results.  A referral was ordered for neurology for further evaluation of your headaches.

## 2015-05-17 NOTE — Progress Notes (Signed)
Pre visit review using our clinic review tool, if applicable. No additional management support is needed unless otherwise documented below in the visit note. 

## 2015-05-17 NOTE — Progress Notes (Signed)
Subjective:    Patient ID: Morgan [name], female    DOB: 1965-07-21, 49 y.o.   MRN: [id number]  HPI  She is here to establish with a new pcp.  She is concerned about headaches and  Skull lesion.  Skull lesion:  She was diagnosed with a tumor on her scalp around 2005. She had imaging and she was told it could not turn into cancer.  Recently she feels it has become more prominent.  She is a poor historian and is not able to give any details about whether or not as well. She does not have a copy is not able to get a copy of her prior imaging. She is having headaches and is concerned about them.   They started about one year ago.  They are not typical sinus, migraine or stress headaches. It is a different type of headache - feels like the nerves in her head are being stretched.  She has not had one of these headaches in a while and is not able to describe it further. She is not aware of any specific triggers. She is concerned that headaches are related to the skull lesion.  Pre-syncope:  She has had a few episode of presyncope and her last episode was two weeks ago. It sometimes occur when singing or after exercising - often when she is in the shower. She drinks a lot of water.  She has never fainted - she always sits down and is able to get through it - the symptoms pass.  She saw a cardiologist in the past and had a full work up.  The testing was normal.    Medications and allergies reviewed with patient and updated if appropriate.  Patient Active Problem List   Diagnosis Date Noted  . Patellofemoral syndrome of both knees 05/02/2015  . Scalp lesion 04/15/2015  . Bipolar disorder ([place]) 04/15/2015  . Knee pain, bilateral 03/05/2015  . Chest pain 03/05/2015    Past Medical History  Diagnosis Date  . Asthma   . IBS (irritable bowel syndrome) 1995  . Arthritis   . Degenerative disorder of bone   . Bulging lumbar disc 07/19/05  . Bipolar disorder ([place])   . History of borderline personality  disorder   . Anxiety   . Hypothyroidism 2007    Developed after use of Lithium    Past Surgical History  Procedure Laterality Date  . Cholecystectomy    . Tubal ligation    . Abdominal hysterectomy    . Shoulder open rotator cuff repair Right 03/2010  . Bunionectomy Right 06/2012  . Appendectomy      Social History   Social History  . Marital Status: Divorced    Spouse Name: N/A  . Number of Children: 2  . Years of Education: 14   Occupational History  . Unemployed    Social History Main Topics  . Smoking status: Former Smoker    Quit date: 07/20/1980  . Smokeless tobacco: Never Used  . Alcohol Use: 0.0 oz/week    0 Standard drinks or equivalent per week     Comment: occ  . Drug Use: No  . Sexual Activity: No     Comment: intercourse age unknown,sexual partners less than 5   Other Topics Concern  . None   Social History Narrative   Fun: [place], travel, music, writing, walking, hiking, volunteering   Denies religious beliefs effecting health care.    Feels safe at home.  Review of Systems  Constitutional: Negative for fever and chills.  Eyes: Positive for visual disturbance (with presyncope only).  Respiratory: Negative for cough, shortness of breath and wheezing.   Cardiovascular: Positive for chest pain. Negative for palpitations and leg swelling.  Gastrointestinal: Positive for nausea (with presyncope only).  Neurological: Positive for light-headedness (with presyncope only) and headaches. Negative for weakness and numbness.       Objective:   Filed Vitals:   05/17/15 1604  BP: 114/78  Pulse: 71  Temp: 98.1 F (36.7 C)  Resp: 16   Filed Weights   05/17/15 1604  Weight: 167 lb (75.751 kg)   Body mass index is 29.12 kg/(m^2).   Physical Exam  Constitutional: She appears well-developed and well-nourished.  HENT:  Head: Normocephalic and atraumatic.  Right Ear: External ear normal.  Left Ear: External ear normal.  Eyes: Conjunctivae and  EOM are normal.  Neck: Neck supple. No tracheal deviation present. No thyromegaly present.  Cardiovascular: Normal rate, regular rhythm and normal heart sounds.   Pulmonary/Chest: Effort normal and breath sounds normal. No respiratory distress. She has no wheezes.  Musculoskeletal: She exhibits no edema.  Lymphadenopathy:    She has no cervical adenopathy.  Neurological: No cranial nerve deficit.  Psychiatric: She has a normal mood and affect. Her behavior is normal.          Assessment & Plan:  See Problem List.  Follow up as needed

## 2015-05-17 NOTE — Assessment & Plan Note (Signed)
It is difficult to get a good history because she does not recall exactly where or when the headache felt like Unlikely to be related to skull deformity Will refer to neuro for further evaluation

## 2015-05-17 NOTE — Assessment & Plan Note (Signed)
Bone growth ? Increase in size - no prior imaging available Agree this is benign and not a concern unless causing local symptoms.   She is not interested in having it removed Will check Skull x-rays unlikely to be causing her headaches reassured

## 2015-05-17 NOTE — Assessment & Plan Note (Signed)
Has never had syncope-related only presyncope Likely vasovagal in nature Has had a full cardiac workup in the past Discussed possible triggers Continue current measures to avoid syncope

## 2015-05-21 ENCOUNTER — Telehealth: Payer: Self-pay | Admitting: Internal Medicine

## 2015-05-21 DIAGNOSIS — M205X2 Other deformities of toe(s) (acquired), left foot: Secondary | ICD-10-CM | POA: Diagnosis not present

## 2015-05-21 DIAGNOSIS — J45909 Unspecified asthma, uncomplicated: Secondary | ICD-10-CM | POA: Diagnosis not present

## 2015-05-21 DIAGNOSIS — R930 Abnormal findings on diagnostic imaging of skull and head, not elsewhere classified: Secondary | ICD-10-CM

## 2015-05-21 DIAGNOSIS — M2012 Hallux valgus (acquired), left foot: Secondary | ICD-10-CM | POA: Diagnosis not present

## 2015-05-21 DIAGNOSIS — M21612 Bunion of left foot: Secondary | ICD-10-CM | POA: Diagnosis not present

## 2015-05-21 DIAGNOSIS — R6889 Other general symptoms and signs: Secondary | ICD-10-CM

## 2015-05-21 NOTE — Telephone Encounter (Signed)
Spoke with pt and pt's daughter to inform. Mailing results to pt. Pt recently had surgery and requested I speak to daughter about results.

## 2015-05-21 NOTE — Telephone Encounter (Signed)
Let her know the xray of her skull does show a bone abnormality where she has the lump on the left side of her head.  I would like her to have a ct scan of the head to make sure this is not a result of a benign tumor arising from inside her head. Ct of head ordered

## 2015-05-23 ENCOUNTER — Telehealth: Payer: Self-pay | Admitting: *Deleted

## 2015-05-23 NOTE — Telephone Encounter (Addendum)
Pt states the surgical boot is very uncomfortable, and she had surgery 05/21/2015.  I told pt to rest and only be up on the surgical foot 5 min/hr, and that will decrease the amount of discomfort the boot is causing, and to pad the area with a soft cloth or come in and we would pad the area.  Pt states she does not have a car to get to the office so will pad the area off.  Pt states the people who drive her have children to pick up at 315pm the day of her POV1 and she needs to change her appt.  06/18/2015 Pt called states started with cough, wheezing and fever Saturday, had this the last time she had foot surgery.  I asked pt if she had redness in calves or feet, heat or hardness in the calves and pt denies.  I told pt I felt she had a respiratory infection or flu to go to the urgent care or her primary doctor.  Pt states she has an appt with her primary doctor tomorrow and will treat the symptoms til then.

## 2015-05-28 ENCOUNTER — Other Ambulatory Visit: Payer: Medicare Other | Admitting: Anesthesiology

## 2015-05-28 ENCOUNTER — Ambulatory Visit (INDEPENDENT_AMBULATORY_CARE_PROVIDER_SITE_OTHER): Payer: Medicare Other | Admitting: Clinical

## 2015-05-28 ENCOUNTER — Encounter (HOSPITAL_COMMUNITY): Payer: Self-pay | Admitting: Clinical

## 2015-05-28 DIAGNOSIS — K921 Melena: Secondary | ICD-10-CM

## 2015-05-28 DIAGNOSIS — F603 Borderline personality disorder: Secondary | ICD-10-CM | POA: Diagnosis not present

## 2015-05-28 DIAGNOSIS — F319 Bipolar disorder, unspecified: Secondary | ICD-10-CM | POA: Diagnosis not present

## 2015-05-28 DIAGNOSIS — Z1211 Encounter for screening for malignant neoplasm of colon: Secondary | ICD-10-CM

## 2015-05-28 NOTE — Progress Notes (Signed)
   THERAPIST PROGRESS NOTE  Session Time: 10:05 -11:00  Participation Level: Active  Behavioral Response: CasualAlertDepressed  Type of Therapy: Individual Therapy  Treatment Goals addressed: improve psychiatric symptoms, Improve unhelpful thinking patterns,and reduce unhealthy behaviors/increase healthy behaviors  Interventions: CBT and Motivational Interviewing,   Summary: Morgan [name] is a 49 y.o. female who presents with Bipolar I disorder, most recent episode (or current) unspecified, Borderline Personality Disorder  Suicidal/Homicidal: No -without intent/plan  Therapist Response: Morgan [name] met with clinician for an individual session. Morgan [name] discussed his psychiatric symptoms, her current life events and her homework. Morgan [name] was wearing a foot/leg brace. She shared that her foot needed it to heal and that she was disappointed that she can not walk as much as she would like. She shared that she had quit taking her Gabapentin. She shared that she weaned on her own and  was doing all right with it. She shared that she is experiencing anxiety a little more quickly. Clinician encouraged her to keep track of her thoughts and emotions and to inform her psychiatrist. Morgan [name] agreed to. She shared that she was also given pain killers which she has been taking only when she needs them which is less than prescribed. Morgan [name] shared that she did not have any suicidal or self harming behavior since last session. Morgan [name] shared that she had completed her homework. She shared that she has continued to practice her grounding and mindfulness techniques. Morgan [name] and clinician reviewed and discussed the 7 panel thought record sheets she had completed about an interaction with her landlord. She identified her emotions, her negative automatic thoughts, the evidence that did and did not support them and the healthier alternative thoughts she formulated. Clinician asked her to expand on each of the categories.  Morgan [name] then had the insight that  He reminded her of her father so she treated him with the expectation he would behave like her father. Her expectation of the situation made her respond to him in a way that would make it likely for him to behave the way she had expected. Morgan [name] had the insight that if she thought about and treated him differently he might respond differently. Morgan [name] and clinician discussed what she could do differently to test this. Morgan [name] and clinician discussed the pros and cons for changing her thoughts and behaviors. Morgan [name] agreed to complete some more cbt thought record sheets and to practice her grounding and mindfulness techniques.  She shared that next session she would like to discuss her thoughts and emotions about the fact she needs a CT scan to check the tumor on her head.   Plan: Return again in 1 weeks.  Diagnosis: Axis I:   Bipolar I disorder, most recent episode (or current) unspecified, Borderline Personality Disorder    Ciel Yanes A, LCSW 05/28/2015

## 2015-05-29 ENCOUNTER — Telehealth: Payer: Self-pay | Admitting: *Deleted

## 2015-05-29 DIAGNOSIS — R195 Other fecal abnormalities: Secondary | ICD-10-CM

## 2015-05-29 NOTE — Telephone Encounter (Signed)
Referral placed they will contact pt to schedule. 

## 2015-05-29 NOTE — Telephone Encounter (Signed)
-----  Message from Ramond Craver, [place] sent at 05/29/2015 12:54 PM EST ----- Regarding: referral to GI Per Dr. Moshe Salisbury "Please inform patient that her fecal Hemoccult cards that she submitted to the office demonstrated that there was blood. I know she had a colonoscopy about 3 years ago and had benign polyps removed. I need for her to make an appointment with her gastroenterologist for follow-up."  Patient's GI was in [place], [place] and she does not have one locally. Would like to be referred here.  (Since she mailed [facility] kit in August but we just received it and it is pos. She would like first available with GI.)  Thanks!

## 2015-05-30 ENCOUNTER — Ambulatory Visit (INDEPENDENT_AMBULATORY_CARE_PROVIDER_SITE_OTHER)
Admission: RE | Admit: 2015-05-30 | Discharge: 2015-05-30 | Disposition: A | Discharge status: Home / Self Care | Payer: Medicare Other | Source: Ambulatory Visit | Attending: Internal Medicine | Admitting: Internal Medicine

## 2015-05-30 ENCOUNTER — Encounter: Payer: Self-pay | Admitting: Gastroenterology

## 2015-05-30 ENCOUNTER — Ambulatory Visit: Payer: Self-pay | Admitting: Family Medicine

## 2015-05-30 DIAGNOSIS — R930 Abnormal findings on diagnostic imaging of skull and head, not elsewhere classified: Secondary | ICD-10-CM

## 2015-05-30 DIAGNOSIS — R6889 Other general symptoms and signs: Secondary | ICD-10-CM

## 2015-05-30 DIAGNOSIS — G9389 Other specified disorders of brain: Secondary | ICD-10-CM | POA: Diagnosis not present

## 2015-05-30 MED ORDER — IOHEXOL 300 MG/ML  SOLN: 80.0000 mL | Freq: Once | INTRAMUSCULAR | Status: DC | PRN

## 2015-05-31 ENCOUNTER — Telehealth: Payer: Self-pay | Admitting: Internal Medicine

## 2015-05-31 ENCOUNTER — Ambulatory Visit (INDEPENDENT_AMBULATORY_CARE_PROVIDER_SITE_OTHER): Payer: Medicare Other

## 2015-05-31 ENCOUNTER — Ambulatory Visit (INDEPENDENT_AMBULATORY_CARE_PROVIDER_SITE_OTHER): Payer: Medicare Other | Admitting: Podiatry

## 2015-05-31 VITALS — Temp 98.2°F

## 2015-05-31 DIAGNOSIS — M205X2 Other deformities of toe(s) (acquired), left foot: Secondary | ICD-10-CM

## 2015-05-31 DIAGNOSIS — Z9889 Other specified postprocedural states: Secondary | ICD-10-CM | POA: Diagnosis not present

## 2015-05-31 DIAGNOSIS — M21619 Bunion of unspecified foot: Secondary | ICD-10-CM

## 2015-05-31 NOTE — Telephone Encounter (Signed)
Appointment on 07/29/15 with Dr.Jacobs

## 2015-05-31 NOTE — Telephone Encounter (Signed)
Rec'd from [place] forward 26 pages to [employer]

## 2015-06-02 NOTE — Progress Notes (Signed)
Subjective:     Patient ID: Morgan [name], female   DOB: 04/06/1966, 49 y.o.   MRN: [id number]  HPI patient states I'm doing really well with my left foot with mild discomfort swelling but able to walk on it at this time   Review of Systems     Objective:   Physical Exam Neurovascular status intact negative Homans sign was noted with well coapted incision site left first metatarsal with good range of motion no crepitus in the joint and wound edges that are coapted well    Assessment:     Doing well post osteotomy first metatarsal left    Plan:     Evaluated x-rays and discussed continued immobilization and dispensed surgical shoe and compression dressing. Reappoint in 3 weeks or earlier if any issues should occur and she will work on the motion of the big toe joint which was explained today

## 2015-06-06 ENCOUNTER — Ambulatory Visit (INDEPENDENT_AMBULATORY_CARE_PROVIDER_SITE_OTHER): Payer: Medicare Other | Admitting: Clinical

## 2015-06-06 ENCOUNTER — Encounter (HOSPITAL_COMMUNITY): Payer: Self-pay | Admitting: Clinical

## 2015-06-06 DIAGNOSIS — F319 Bipolar disorder, unspecified: Secondary | ICD-10-CM | POA: Diagnosis not present

## 2015-06-06 DIAGNOSIS — F603 Borderline personality disorder: Secondary | ICD-10-CM

## 2015-06-06 NOTE — Progress Notes (Signed)
   THERAPIST PROGRESS NOTE  Session Time: 10:03 - 11:00  Participation Level: Active  Behavioral Response: CasualAlertDepressed  Type of Therapy: Individual Therapy  Treatment Goals addressed: improve psychiatric symptoms, Improve unhelpful thinking patterns, self-care skills, and reduce unhealthy behaviors/increase healthy behaviors  Interventions: CBT and Motivational Interviewing, Grounding and Mindfulness Techniques  Summary: Morgan [name] is a 49 y.o. female who presents with Bipolar I disorder, most recent episode (or current) unspecified, Borderline Personality Disorder  Suicidal/Homicidal: No -without intent/plan  Therapist Response: Aneshia met with clinician for an individual session. Morgan [name] discussed his psychiatric symptoms, her current life events and her homework. She shared that she was feeling very sad lately. She shared that she had been practicing her grounding and mindfulness techniques. She shared that she had completed a 7 panel thought record sheet on her thoughts about something clinician said at last session, which was something to the effect of hoping the client enjoy her week and her fabulous self. Morgan [name] had internalized the word fabulous and her thoughts about her father and how he thought he was fabulous and how she didn't want to be like him. She had only completed half of the worksheet. Client and clinician discussed and reviewed the worksheet. Clinician asked open ended questions to assist Morgan [name] with finding her own answers to the rest of the worksheet. Morgan [name] had the insight that she takes things said and turns them into a negative. She shared that when she feels good about herself, she shuts it down so that she is not being boastful or like her father. Client and Clinician discussed the quandary of working towards feeling good and better and then not believing she has the right to or that doing so makes her bad in some way.  Morgan [name] and clinician discussed  the evidence for and against her beliefs. She shared that she would to need to think on it more as she had such strong beliefs against being boastful or prideful. Clinician asked if there was not a way to feel pride in oneself without being prideful. Client and clinician agreed to discuss it further at a future session. Morgan [name] agreed to continue her homework until next session.  Plan: Return again in 1 weeks.  Diagnosis: Axis I:   Bipolar I disorder, most recent episode (or current) unspecified, Borderline Personality Disorder   Morgan [name] A, LCSW 06/06/2015

## 2015-06-11 ENCOUNTER — Encounter (HOSPITAL_COMMUNITY): Payer: Self-pay | Admitting: Clinical

## 2015-06-11 ENCOUNTER — Ambulatory Visit (INDEPENDENT_AMBULATORY_CARE_PROVIDER_SITE_OTHER): Payer: Medicare Other | Admitting: Clinical

## 2015-06-11 DIAGNOSIS — F319 Bipolar disorder, unspecified: Secondary | ICD-10-CM

## 2015-06-11 DIAGNOSIS — F603 Borderline personality disorder: Secondary | ICD-10-CM

## 2015-06-11 NOTE — Progress Notes (Signed)
   THERAPIST PROGRESS NOTE  Session Time: 11:07 - 12:00  Participation Level: Active  Behavioral Response: CasualAlertDepressed  Type of Therapy: Individual Therapy  Treatment Goals addressed: improve psychiatric symptoms, Improve unhelpful thinking patterns, self-care skills, and reduce unhealthy behaviors/increase healthy behaviors  Interventions: CBT and Motivational Interviewing, Grounding and Mindfulness Techniques  Summary: Morgan [name] is a 49 y.o. female who presents with Bipolar I disorder, most recent episode (or current) unspecified, Borderline Personality Disorder  Suicidal/Homicidal: No -without intent/plan  Therapist Response: Rosemae met with clinician for an individual session. Dejana discussed his psychiatric symptoms, her current life events and her homework. She shared that she had practiced her grounding techniques and that she worked on a cbt thought record sheet. She shared that she was asked to speak at a friends memorial. She shared that she was then told it was a mistake and didn't prepare. Then upon arriving she was told she would be speaking. She shared that the minister told her several times to keep it to two minutes. She stated that this heightened her anxiety and triggered all sorts of internal dialog about speaking too much and others not wanting to hear her. She also shared that she was very disappointed about the quality of her sharing. She stated that she was very upset and felt like self harming though she did not. Client and clinician used cbt to break it down. Angellica identified the worse case senerio "that others were disappointed and she let them down." Client and clinician discussed what it would mean if this was true. Kristl shared that it meant she was bad or unworthy in some way. Client and clinician used the 7 panel thought record sheet to challenge this belief. Keiley and clinician discussed how she would view someone else who did a less than  perfect 2 minute speech at a funeral. Embrie shared that she might judge them or think she could do better. Maricia stated that even if she did they would never know. Farah had the insight that others were not even thinking about it anymore though she was several days later and punishing herself for something she can not change. Clinician suggested that more than likely people were not judging her as everyone knows that a funeral is an emotional time to speak about someone you care about. Clinician suggested that she change the focus of her thoughts to the present. Client and clinician discussed how she could change her thoughts. Client and clinician discussed healthy self care an treating oneself with kindness.   Plan: Return again in 1 weeks.  Diagnosis: Axis I:   Bipolar I disorder, most recent episode (or current) unspecified, Borderline Personality Disorder   Dyani Babel A, LCSW 06/11/2015

## 2015-06-17 ENCOUNTER — Telehealth: Payer: Self-pay | Admitting: *Deleted

## 2015-06-17 NOTE — Telephone Encounter (Signed)
Left msg on triage stating not sure if she have flu not feeling good at all, been wheezing. Called pt back inform her she will need appt to be evaluated. Not sure if she can make it today she states she does not have a car. Wanted to know have we had any flu cases. Did inform pt I'm not really aware, but been seeing a lot people with sinus infection, but by her  Wheezing md need to listen to lungs we can't say that she have the flu without evaluation. Made appt for wed 06/19/15 w/Greg Calone. Also inform if sxs get worse by wed to go to Er for eval.../lmb

## 2015-06-19 ENCOUNTER — Telehealth (HOSPITAL_COMMUNITY): Payer: Self-pay | Admitting: Clinical

## 2015-06-19 ENCOUNTER — Ambulatory Visit (HOSPITAL_COMMUNITY): Payer: Self-pay | Admitting: Psychiatry

## 2015-06-19 ENCOUNTER — Ambulatory Visit (INDEPENDENT_AMBULATORY_CARE_PROVIDER_SITE_OTHER): Payer: Medicare Other | Admitting: Family

## 2015-06-19 ENCOUNTER — Encounter: Payer: Self-pay | Admitting: Family

## 2015-06-19 ENCOUNTER — Ambulatory Visit (INDEPENDENT_AMBULATORY_CARE_PROVIDER_SITE_OTHER)
Admission: RE | Admit: 2015-06-19 | Discharge: 2015-06-19 | Disposition: A | Discharge status: Home / Self Care | Payer: Medicare Other | Source: Ambulatory Visit | Attending: Family | Admitting: Family

## 2015-06-19 VITALS — BP 108/80 | HR 106 | Temp 99.7°F | Resp 18 | Ht 63.5 in | Wt 165.0 lb

## 2015-06-19 DIAGNOSIS — R05 Cough: Secondary | ICD-10-CM | POA: Diagnosis not present

## 2015-06-19 DIAGNOSIS — R059 Cough, unspecified: Secondary | ICD-10-CM

## 2015-06-19 DIAGNOSIS — R509 Fever, unspecified: Secondary | ICD-10-CM | POA: Diagnosis not present

## 2015-06-19 MED ORDER — AEROCHAMBER PLUS MISC: Status: DC

## 2015-06-19 MED ORDER — AZITHROMYCIN 250 MG PO TABS: ORAL_TABLET | ORAL | Status: DC

## 2015-06-19 MED ORDER — PROMETHAZINE-DM 6.25-15 MG/5ML PO SYRP: 5.0000 mL | ORAL_SOLUTION | Freq: Four times a day (QID) | ORAL | Status: DC | PRN

## 2015-06-19 NOTE — Assessment & Plan Note (Addendum)
Symptoms and exam consistent with bronchitis however cannot rule out pneumonia. Obtain chest x-ray. Start azithromycin. Start promethazine DM. Sample of Breo given to help with cough and wheeze. Continue over-the-counter and natural remedies as needed for symptom relief and supportive care. Follow-up if symptoms worsen or fail to improve.

## 2015-06-19 NOTE — Telephone Encounter (Signed)
Morgan [name] shared that she was at an appointment but  wanted clinician to know that she was cutting again and was upset her appointment had been cancelled. Clinician advised her to go to the go to the emergency room. Clinician reminded her to also use her skills. [name] said she would not go to the emergency room and that she was sick with a fever and so had not cut in a day. Clinician reminded her that she has the tools necessary and also has emergency help.

## 2015-06-19 NOTE — Patient Instructions (Signed)
Thank you for choosing [facility].  Summary/Instructions:  Your prescription(s) have been submitted to your pharmacy or been printed and provided for you. Please take as directed and contact our office if you believe you are having problem(s) with the medication(s) or have any questions.  Please stop by radiology on the basement level of the building for your x-rays. Your results will be released to MyChart (or called to you) after review, usually within 72 hours after test completion. If any treatments or changes are necessary, you will be notified at that same time.  If your symptoms worsen or fail to improve, please contact our office for further instruction, or in case of emergency go directly to the emergency room at the closest medical facility.   General Recommendations:    Please drink plenty of fluids.  Get plenty of rest   Sleep in humidified air  Use saline nasal sprays  Netti pot   OTC Medications:  Decongestants - helps relieve congestion   Flonase (generic fluticasone) or Nasacort (generic triamcinolone) - please make sure to use the "cross-over" technique at a 45 degree angle towards the opposite eye as opposed to straight up the nasal passageway.   Sudafed (generic pseudoephedrine - Note this is the one that is available behind the pharmacy counter); Products with phenylephrine (-PE) may also be used but is often not as effective as pseudoephedrine.   If you have HIGH BLOOD PRESSURE - Coricidin HBP; AVOID any product that is -D as this contains pseudoephedrine which may increase your blood pressure.  Afrin (oxymetazoline) every 6-8 hours for up to 3 days.   Allergies - helps relieve runny nose, itchy eyes and sneezing   Claritin (generic loratidine), Allegra (fexofenidine), or Zyrtec (generic cyrterizine) for runny nose. These medications should not cause drowsiness.  Note - Benadryl (generic diphenhydramine) may be used however may cause  drowsiness  Cough -   Delsym or Robitussin (generic dextromethorphan)  Expectorants - helps loosen mucus to ease removal   Mucinex (generic guaifenesin) as directed on the package.  Headaches / General Aches   Tylenol (generic acetaminophen) - DO NOT EXCEED 3 grams (3,000 mg) in a 24 hour time period  Advil/Motrin (generic ibuprofen)   Sore Throat -   Salt water gargle   Chloraseptic (generic benzocaine) spray or lozenges / Sucrets (generic dyclonine)     

## 2015-06-19 NOTE — Progress Notes (Signed)
Subjective:    Patient ID: Morgan [name], female    DOB: [birth year], 49 y.o.   MRN: [id number]  Chief Complaint  Patient presents with  . Headache    wheezing, fever, having headaches, cough, states everything is in her chest     HPI:  Morgan [name] is a 49 y.o. female who  has a past medical history of Asthma; IBS (irritable bowel syndrome) (1995); Arthritis; Degenerative disorder of bone; Bulging lumbar disc (07/19/05); Bipolar disorder ([place]); History of borderline personality disorder; Anxiety; and Hypothyroidism (2007). and presents today for an acute office visit.  This is a new problem. Associated symptoms of wheezing, fever, headaches, and cough has been going on for about 4 days. The course of the symptoms have progressively worsening. Temperature max was 102.1 which was yesterday. Denies any modifying factors or treatments in attempt to make it better. Has been drinking herbal teas and water. Describes decreased appetite and feels like all of the congestion is in her chest. Denies any recent antibiotic use.    Allergies  Allergen Reactions  . Abilify [Aripiprazole] Swelling    Throat begins to swell   . Ciprofloxacin Shortness Of Breath  . Lamictal [Lamotrigine] Rash  . Amoxicillin Hives  . Doxycycline Hives  . Latex Rash  . Meloxicam Other (See Comments)    Induces mania  . Mirapex [Pramipexole] Hives  . Prednisone Other (See Comments)    Interacts with Lithium. " All steroids"  . Risperdal [Risperidone] Hives  . Tape Rash    "steri strips"  . Cortisone     Exacerbates the mania of her bipolar  . Percocet [Oxycodone-Acetaminophen] Other (See Comments)    Severe dizziness  . Sulfa Antibiotics Rash     Current Outpatient Prescriptions on File Prior to Visit  Medication Sig Dispense Refill  . albuterol (PROVENTIL) (5 MG/ML) 0.5% nebulizer solution Take 2.5 mg by nebulization every 6 (six) hours as needed for wheezing or shortness of breath.     . cetirizine  (ZYRTEC) 10 MG tablet Take 10 mg by mouth as needed.     . Diclofenac Sodium 2 % SOLN Apply 1 pump twice daily as needed. 112 g 3  . Ergocalciferol (VITAMIN D2) 2000 UNITS TABS Take 1 tablet daily. 90 tablet 3  . gabapentin (NEURONTIN) 300 MG capsule 1  bid 60 capsule 4  . hyoscyamine (LEVSIN, ANASPAZ) 0.125 MG tablet Take 0.125 mg by mouth as needed for cramping.     Marland Kitchen ibuprofen (ADVIL,MOTRIN) 100 MG tablet Take 400-800 mg by mouth every 6 (six) hours as needed for pain.    Marland Kitchen LORazepam (ATIVAN) 0.5 MG tablet 1  qday  prn (Patient taking differently: Take 0.5 mg by mouth daily as needed for anxiety. ) 10 tablet 1  . Vitamin D, Ergocalciferol, (DRISDOL) 50000 UNITS CAPS capsule Take 1 capsule (50,000 Units total) by mouth every 7 (seven) days. 12 capsule 0   No current facility-administered medications on file prior to visit.    Review of Systems  Constitutional: Positive for fever and chills.  HENT: Positive for congestion and sore throat. Negative for sinus pressure.   Respiratory: Positive for cough, chest tightness, shortness of breath and wheezing.   Gastrointestinal: Negative for nausea, vomiting and diarrhea.  Neurological: Positive for headaches.      Objective:    BP 108/80 mmHg  Pulse 106  Temp(Src) 99.7 F (37.6 C) (Oral)  Resp 18  Ht 5' 3.5" (1.613 m)  Wt 165 lb (74.844 kg)  BMI 28.77 kg/m2  SpO2 95% Nursing note and vital signs reviewed.  Physical Exam  Constitutional: She is oriented to person, place, and time. She appears well-developed and well-nourished. No distress.  HENT:  Right Ear: Hearing, tympanic membrane, external ear and ear canal normal.  Left Ear: Hearing, tympanic membrane, external ear and ear canal normal.  Nose: Nose normal. Right sinus exhibits no maxillary sinus tenderness and no frontal sinus tenderness. Left sinus exhibits no maxillary sinus tenderness and no frontal sinus tenderness.  Mouth/Throat: Uvula is midline, oropharynx is clear and  moist and mucous membranes are normal.  Cardiovascular: Normal rate, regular rhythm, normal heart sounds and intact distal pulses.   Pulmonary/Chest: Effort normal. She has rales.  Neurological: She is alert and oriented to person, place, and time.  Skin: Skin is warm and dry.  Psychiatric: She has a normal mood and affect. Her behavior is normal. Judgment and thought content normal.       Assessment & Plan:   Problem List Items Addressed This Visit      Other   Cough - Primary    Symptoms and exam consistent with bronchitis however cannot rule out pneumonia. Obtain chest x-ray. Start azithromycin. Start promethazine DM. Sample of be Breo given to help with cough and wheeze. Continue over-the-counter and natural remedies as needed for symptom relief and supportive care. Follow-up if symptoms worsen or fail to improve.      Relevant Medications   promethazine-dextromethorphan (PROMETHAZINE-DM) 6.25-15 MG/5ML syrup   azithromycin (ZITHROMAX) 250 MG tablet   Other Relevant Orders   DG Chest 2 View

## 2015-06-20 ENCOUNTER — Ambulatory Visit (HOSPITAL_COMMUNITY): Payer: Self-pay | Admitting: Clinical

## 2015-06-20 ENCOUNTER — Ambulatory Visit: Payer: Self-pay | Admitting: Family Medicine

## 2015-06-21 ENCOUNTER — Ambulatory Visit (HOSPITAL_COMMUNITY): Payer: Self-pay | Admitting: Psychiatry

## 2015-06-24 ENCOUNTER — Ambulatory Visit (INDEPENDENT_AMBULATORY_CARE_PROVIDER_SITE_OTHER): Payer: Medicare Other

## 2015-06-24 ENCOUNTER — Encounter: Payer: Self-pay | Admitting: Podiatry

## 2015-06-24 ENCOUNTER — Ambulatory Visit (INDEPENDENT_AMBULATORY_CARE_PROVIDER_SITE_OTHER): Payer: Medicare Other | Admitting: Podiatry

## 2015-06-24 VITALS — BP 118/81 | HR 59 | Resp 16

## 2015-06-24 DIAGNOSIS — M2012 Hallux valgus (acquired), left foot: Secondary | ICD-10-CM

## 2015-06-24 DIAGNOSIS — Z9889 Other specified postprocedural states: Secondary | ICD-10-CM

## 2015-06-24 DIAGNOSIS — M205X2 Other deformities of toe(s) (acquired), left foot: Secondary | ICD-10-CM

## 2015-06-25 ENCOUNTER — Telehealth: Payer: Self-pay | Admitting: Internal Medicine

## 2015-06-25 NOTE — Telephone Encounter (Signed)
Pt was in to see Marya Amsler last week and she woke up this morning not being able to breathe.  Can you please call her

## 2015-06-25 NOTE — Telephone Encounter (Signed)
It doesn't appear as though she is still on the lithium which is the reason for allergy to prednisone. If this is the case we can send in prednisone or she can come by to pick up a sample of Breo.

## 2015-06-25 NOTE — Progress Notes (Signed)
Subjective:     Patient ID: Morgan [name], female   DOB: 1966/03/03, 49 y.o.   MRN: [id number]  HPI patient states I'm feeling pretty good with mild pain if I walk quite a bit   Review of Systems     Objective:   Physical Exam Neurovascular status intact negative Homans sign noted with well coapted incision site first metatarsal left with good range of motion and good dorsi and plantarflexion    Assessment:     Doing well post osteotomy first metatarsal left before week duration    Plan:     Advised patient on long-term gradual return to shoe gear and compression elevation and range of motion of the first MPJ. Patient will be seen back in around 8 weeks and was reviewed with x-rays today

## 2015-06-25 NOTE — Telephone Encounter (Signed)
States that she woke up at 2 am and couldn't catch her breath. Did take her inhaler and felt better. Still have some tightness in her chest and did wake up with a bad cough at 6 am. She is just scared she has never had pneumonia and doesn't know what to expect or what to do at this point. Does pt need to schedule a follow up appt? Please advise

## 2015-06-26 NOTE — Telephone Encounter (Signed)
Returned pts call. She would rather not take prednisone bc it affects her moods. She had a better night last night so she will come back if needed or call.

## 2015-06-27 ENCOUNTER — Encounter (HOSPITAL_COMMUNITY): Payer: Self-pay | Admitting: Clinical

## 2015-06-27 ENCOUNTER — Ambulatory Visit (INDEPENDENT_AMBULATORY_CARE_PROVIDER_SITE_OTHER): Payer: Medicare Other | Admitting: Clinical

## 2015-06-27 DIAGNOSIS — F319 Bipolar disorder, unspecified: Secondary | ICD-10-CM

## 2015-06-27 DIAGNOSIS — F603 Borderline personality disorder: Secondary | ICD-10-CM | POA: Diagnosis not present

## 2015-06-27 NOTE — Progress Notes (Signed)
   THERAPIST PROGRESS NOTE  Session Time: 10:05 -11:02  Participation Level: Active  Behavioral Response: CasualAlertDepressed  Type of Therapy: Individual Therapy  Treatment Goals addressed: improve psychiatric symptoms, Improve unhelpful thinking patterns, self-care skills, and reduce unhealthy behaviors/increase healthy behaviors  Interventions: CBT and Motivational Interviewing, Grounding and Mindfulness Techniques  Summary: Morgan [name] is a 49 y.o. female who presents with Bipolar I disorder, most recent episode (or current) unspecified, Borderline Personality Disorder  Suicidal/Homicidal: No -without intent/plan  Therapist Response: Morgan [name] met with clinician for an individual session. Morgan [name] discussed his psychiatric symptoms, her current life events and her homework. She shared that she did not go to the emergency room after her phone call. She stated that she had pneumonia and was too sick to cause her self harm. Morgan [name] had the insight that her behavior was in order to get attention. She shared that she felt like she behaved this way because of wounds from her childhood.Morgan [name] shared that when she was younger she behaved poorly to get attention.  She shared that she had a lot of anger and judgement about her younger self. Clinician suggested that if she understood and had compassion for her younger self  (her motives) then she might not feel so compelled to behave in ways that are not in her best interest. Morgan [name] shared that she did not know how to have compassion for herself. She shared that she wanted to disown herself. Client and clinician discussed Morgan [name]'s behaviors toward her own children when they were young and they behaved poorly. Morgan [name] shared that she loved the child but not the behavior. Client and clinician discussed how she could redirect them while still have love and compassion for them. Client and clinician discussed how she could do the same for herself.  Client  and clinician discussed how having compassion for herself could help her to increase healthy behaviors and reduce unhealthy behaviors, Client and clinician completed mindfulness visualization exercise together. Morgan [name] agreed to practice the exercise herself daily until next session.  Plan: Return again in 1 weeks.  Diagnosis: Axis I:   Bipolar I disorder, most recent episode (or current) unspecified, Borderline Personality Disorder   Morgan [name] A, LCSW 06/27/2015

## 2015-07-04 ENCOUNTER — Ambulatory Visit (INDEPENDENT_AMBULATORY_CARE_PROVIDER_SITE_OTHER): Payer: Medicare Other | Admitting: Clinical

## 2015-07-04 ENCOUNTER — Encounter (HOSPITAL_COMMUNITY): Payer: Self-pay | Admitting: Clinical

## 2015-07-04 DIAGNOSIS — F319 Bipolar disorder, unspecified: Secondary | ICD-10-CM

## 2015-07-04 DIAGNOSIS — F603 Borderline personality disorder: Secondary | ICD-10-CM | POA: Diagnosis not present

## 2015-07-04 NOTE — Progress Notes (Signed)
   THERAPIST PROGRESS NOTE  Session Time: 10:00 - 10:55  Participation Level: Active  Behavioral Response: CasualAlertAnxious and Depressed   Type of Therapy: Individual Therapy  Treatment Goals addressed: improve psychiatric symptoms, Improve unhelpful thinking patterns, self-care skills, and reduce unhealthy behaviors/increase healthy behaviors  Interventions: CBT and Motivational Interviewing, Grounding and Mindfulness Techniques  Summary: Mairin Lindsley is a 49 y.o. female who presents with Bipolar I disorder, most recent episode (or current) unspecified, Borderline Personality Disorder  Suicidal/Homicidal: No -without intent/plan  Therapist Response: Ligia met with clinician for an individual session. Kayra discussed his psychiatric symptoms and  her current life events. She shared that she had not self harmed and was doing good part of the week. She shared she had been doing well with her mindfulness techniques until she was emotionally upset when her son told her he was going to his Dad's house rather than come to see her. Melesa shared what she said to him which basically laid the responsibility for her mental health on him. Client and clinician discussed this approach. Nanea shared her reasoning for doing so. She shared how upset and hurt she was that he wasn't coming. Client and clinician asked what her goal was - "to have a closer better relationship with him". Client and clinician discussed whether or not it was a healthy behavior to make her son responsible for her mental health. Client and clinician discussed her usual response to being guilted by family members - "to avoid them" Client and clinician discussed whether or not this behavior was likely to increase or decrease her son's desire to be close to her. Mercedez had the insight that her behavior was producing the exact opposite results than she wanted. Crystalina shared that her emotions were true. " Clinician affirmed  this. Korynne shared she didn't want to feel lonely and rejected. Client and clinician discussed how she could convey her disappointment and desire to see her son without the adding the burden of being responsible for her mental health. Client and clinician discussed how she could care for herself as well as put her focus on the positive things in her life that are her, like her daughter, friends, church, etc. Client and clinician discussed the importance of self care regardless of if everything is going right or not. Hagan agreed to continue her practice until next session.    Plan: Return again in 1 weeks.  Diagnosis: Axis I:   Bipolar I disorder, most recent episode (or current) unspecified, Borderline Personality Disorder   Braun Rocca A, LCSW 07/04/2015

## 2015-07-08 NOTE — Telephone Encounter (Signed)
Scheduled pt and OV.

## 2015-07-08 NOTE — Telephone Encounter (Signed)
Informed pt on vm °

## 2015-07-08 NOTE — Telephone Encounter (Signed)
Please have her make an office visit or go to Urgent Care.

## 2015-07-08 NOTE — Telephone Encounter (Signed)
Pt is still feeling really bad and is still coughing. Her breathing has gotten worse. she is wondering if you would call something in or if she needs to come back for an OV Please advise

## 2015-07-09 ENCOUNTER — Telehealth: Payer: Self-pay | Admitting: Internal Medicine

## 2015-07-09 NOTE — Telephone Encounter (Signed)
Rec'd from [facility] forward 22 pages to Dr. Sharlet Salina

## 2015-07-10 ENCOUNTER — Ambulatory Visit (INDEPENDENT_AMBULATORY_CARE_PROVIDER_SITE_OTHER): Payer: Medicare Other | Admitting: Family

## 2015-07-10 ENCOUNTER — Encounter: Payer: Self-pay | Admitting: Family

## 2015-07-10 VITALS — BP 118/88 | HR 72 | Temp 97.8°F | Resp 18 | Ht 63.5 in | Wt 162.0 lb

## 2015-07-10 DIAGNOSIS — J180 Bronchopneumonia, unspecified organism: Secondary | ICD-10-CM | POA: Diagnosis not present

## 2015-07-10 MED ORDER — CLARITHROMYCIN 500 MG PO TABS: 500.0000 mg | ORAL_TABLET | Freq: Two times a day (BID) | ORAL | Status: DC

## 2015-07-10 MED ORDER — FLUCONAZOLE 150 MG PO TABS: 150.0000 mg | ORAL_TABLET | Freq: Once | ORAL | Status: DC

## 2015-07-10 NOTE — Patient Instructions (Signed)
Thank you for choosing [facility].  Summary/Instructions:  Your prescription(s) have been submitted to your pharmacy or been printed and provided for you. Please take as directed and contact our office if you believe you are having problem(s) with the medication(s) or have any questions.  If your symptoms worsen or fail to improve, please contact our office for further instruction, or in case of emergency go directly to the emergency room at the closest medical facility.     

## 2015-07-10 NOTE — Progress Notes (Signed)
Subjective:    Patient ID: Morgan [name], female    DOB: 07-23-1965, 49 y.o.   MRN: [id number]  Chief Complaint  Patient presents with  . Follow-up    still not feeling well, having issues with smells and food burning her throat, still having issues with SOB, having some abdominal pain    HPI:  Morgan [name] is a 49 y.o. female who  has a past medical history of Asthma; IBS (irritable bowel syndrome) (1995); Arthritis; Degenerative disorder of bone; Bulging lumbar disc (07/19/05); Bipolar disorder ([place]); History of borderline personality disorder; Anxiety; and Hypothyroidism (2007). and presents today for an office follow up.  Recently evaluated in the office and diagnosed with bronchopneumonia and treated with azithromycin. She completed the azithromycin and noted significant improvement. Several days later she got worse again. She presents today with the associated symptoms of malaise, issues with smells, shortness of breath and some abdominal pain. Describes that she is feeling nausea on occasion and is having to eat small portions and has been fatigued. Also notes that she is having a burning sensation in her throat when she eats.   Allergies  Allergen Reactions  . Abilify [Aripiprazole] Swelling    Throat begins to swell   . Ciprofloxacin Shortness Of Breath  . Lamictal [Lamotrigine] Rash  . Amoxicillin Hives  . Doxycycline Hives  . Latex Rash  . Meloxicam Other (See Comments)    Induces mania  . Mirapex [Pramipexole] Hives  . Prednisone Other (See Comments)    Interacts with Lithium. " All steroids"  . Risperdal [Risperidone] Hives  . Tape Rash    "steri strips"  . Cortisone     Exacerbates the mania of her bipolar  . Percocet [Oxycodone-Acetaminophen] Other (See Comments)    Severe dizziness  . Sulfa Antibiotics Rash     Current Outpatient Prescriptions on File Prior to Visit  Medication Sig Dispense Refill  . albuterol (PROVENTIL) (5 MG/ML) 0.5% nebulizer  solution Take 2.5 mg by nebulization every 6 (six) hours as needed for wheezing or shortness of breath.     . cetirizine (ZYRTEC) 10 MG tablet Take 10 mg by mouth as needed.     . Diclofenac Sodium 2 % SOLN Apply 1 pump twice daily as needed. 112 g 3  . Ergocalciferol (VITAMIN D2) 2000 UNITS TABS Take 1 tablet daily. 90 tablet 3  . gabapentin (NEURONTIN) 300 MG capsule 1  bid 60 capsule 4  . hyoscyamine (LEVSIN, ANASPAZ) 0.125 MG tablet Take 0.125 mg by mouth as needed for cramping.     Marland Kitchen ibuprofen (ADVIL,MOTRIN) 100 MG tablet Take 400-800 mg by mouth every 6 (six) hours as needed for pain.    Marland Kitchen LORazepam (ATIVAN) 0.5 MG tablet 1  qday  prn (Patient taking differently: Take 0.5 mg by mouth daily as needed for anxiety. ) 10 tablet 1  . promethazine-dextromethorphan (PROMETHAZINE-DM) 6.25-15 MG/5ML syrup Take 5 mLs by mouth 4 (four) times daily as needed. 118 mL 0  . Spacer/Aero-Holding Chambers (AEROCHAMBER PLUS) inhaler Use as instructed with inhaler as needed. 1 each 2  . Vitamin D, Ergocalciferol, (DRISDOL) 50000 UNITS CAPS capsule Take 1 capsule (50,000 Units total) by mouth every 7 (seven) days. 12 capsule 0   No current facility-administered medications on file prior to visit.    Review of Systems  Constitutional: Positive for appetite change and fatigue. Negative for fever and chills.  HENT: Positive for congestion and sore throat. Negative for sinus pressure.   Respiratory: Positive for  cough, shortness of breath and wheezing.   Neurological: Positive for headaches.      Objective:    BP 118/88 mmHg  Pulse 72  Temp(Src) 97.8 F (36.6 C) (Oral)  Resp 18  Ht 5' 3.5" (1.613 m)  Wt 162 lb (73.483 kg)  BMI 28.24 kg/m2  SpO2 97% Nursing note and vital signs reviewed.  Physical Exam  Constitutional: She is oriented to person, place, and time. She appears well-developed and well-nourished. No distress.  HENT:  Right Ear: Hearing, tympanic membrane, external ear and ear canal  normal.  Left Ear: Hearing, tympanic membrane, external ear and ear canal normal.  Nose: Nose normal. Right sinus exhibits no maxillary sinus tenderness and no frontal sinus tenderness. Left sinus exhibits no maxillary sinus tenderness and no frontal sinus tenderness.  Mouth/Throat: Uvula is midline, oropharynx is clear and moist and mucous membranes are normal. No oropharyngeal exudate, posterior oropharyngeal edema, posterior oropharyngeal erythema or tonsillar abscesses.  Cardiovascular: Normal rate, regular rhythm, normal heart sounds and intact distal pulses.   Pulmonary/Chest: Effort normal. She has rales.  Neurological: She is alert and oriented to person, place, and time.  Skin: Skin is warm and dry.  Psychiatric: She has a normal mood and affect. Her behavior is normal. Judgment and thought content normal.       Assessment & Plan:   Problem List Items Addressed This Visit      Respiratory   Bronchopneumonia - Primary    Appears improved from course of azithromycin but continues to experience symptoms which could be a viral syndrome. Start clarithromycin to cover as there are adventitious lung sounds that remain. Abdominal pain is most likely related to constipation. Continue over the counter medications as needed for symptom relief and supportive care. Follow up if symptoms worsen or do not improve. Start diflucan as needed for post-antibiotic candidiasis.       Relevant Medications   clarithromycin (BIAXIN) 500 MG tablet   fluconazole (DIFLUCAN) 150 MG tablet

## 2015-07-10 NOTE — Assessment & Plan Note (Signed)
Appears improved from course of azithromycin but continues to experience symptoms which could be a viral syndrome. Start clarithromycin to cover as there are adventitious lung sounds that remain. Abdominal pain is most likely related to constipation. Continue over the counter medications as needed for symptom relief and supportive care. Follow up if symptoms worsen or do not improve. Start diflucan as needed for post-antibiotic candidiasis.

## 2015-07-10 NOTE — Progress Notes (Signed)
Pre visit review using our clinic review tool, if applicable. No additional management support is needed unless otherwise documented below in the visit note. 

## 2015-07-11 ENCOUNTER — Encounter (HOSPITAL_COMMUNITY): Payer: Self-pay | Admitting: Clinical

## 2015-07-11 ENCOUNTER — Ambulatory Visit (INDEPENDENT_AMBULATORY_CARE_PROVIDER_SITE_OTHER): Payer: Medicare Other | Admitting: Clinical

## 2015-07-11 DIAGNOSIS — F319 Bipolar disorder, unspecified: Secondary | ICD-10-CM | POA: Diagnosis not present

## 2015-07-11 DIAGNOSIS — F603 Borderline personality disorder: Secondary | ICD-10-CM | POA: Diagnosis not present

## 2015-07-11 NOTE — Progress Notes (Signed)
   THERAPIST PROGRESS NOTE  Session Time: 9:59 -  Participation Level: Active  Behavioral Response: CasualAlertDepressed   Type of Therapy: Individual Therapy  Treatment Goals addressed: improve psychiatric symptoms, Improve unhelpful thinking patterns, self-care skills, and reduce unhealthy behaviors/increase healthy behaviors  Interventions: CBT and Motivational Interviewing, Grounding and Mindfulness Techniques  Summary: Morgan [name] is a 50 y.o. female who presents with Bipolar I disorder, most recent episode (or current) unspecified, Borderline Personality Disorder  Suicidal/Homicidal: No -without intent/plan  Therapist Response: Morgan [name] met with clinician for an individual session. Morgan [name] discussed his psychiatric symptoms, her current life events. She shared that she and her daughter planned to spend Christmas together. She shared that she had not self harmed this past week. [name] and clinician had a discussion about what Morgan [name] would like to get out of therapy. During the course of the discussion [name] revealed that she had been told in the past that she would need therapy for the rest of her life because she was bipolar. Clinician shared that she did not believe it was true Morgan [name] has been in therapy for 14 years). Morgan [name] shared that she had a lot of trauma in her past. Client and clinician discussed the fact that Morgan [name] could work on the trauma but that she might benefit more by working on how to improve her thoughts so that she behaves in healthier ways so that she can experience more joy. Client and clinician discussed her belief that in order to heal she had to continue to experience emotional pain. Client and clinician discussed whether or not this was a truth or an option. Clinician proposed that it would be possible for Morgan [name] to experience more joy. Client and clinician discussed the process of creating positive emotional experiences while also growing.Client and  clinician discussed self care. Morgan [name] agreed to think about the possiblitiy.    Plan: Return again in 1 weeks.  Diagnosis: Axis I:   Bipolar I disorder, most recent episode (or current) unspecified, Borderline Personality Disorder   Darielys Giglia A, LCSW 07/11/2015

## 2015-07-18 ENCOUNTER — Ambulatory Visit (INDEPENDENT_AMBULATORY_CARE_PROVIDER_SITE_OTHER): Payer: Medicare Other | Admitting: Clinical

## 2015-07-18 ENCOUNTER — Encounter (HOSPITAL_COMMUNITY): Payer: Self-pay | Admitting: Clinical

## 2015-07-18 DIAGNOSIS — F319 Bipolar disorder, unspecified: Secondary | ICD-10-CM | POA: Diagnosis not present

## 2015-07-18 DIAGNOSIS — F603 Borderline personality disorder: Secondary | ICD-10-CM

## 2015-07-18 NOTE — Progress Notes (Signed)
   THERAPIST PROGRESS NOTE  Session Time: 10:00 - 10:55  Participation Level: Active  Behavioral Response: CasualAlertDepressed and Irritable   Type of Therapy: Individual Therapy  Treatment Goals addressed: improve psychiatric symptoms, Improve unhelpful thinking patterns, self-care skills, and reduce unhealthy behaviors/increase healthy behaviors  Interventions: CBT and Motivational Interviewing,   Summary: Ayla Dunigan is a 49 y.o. female who presents with Bipolar I disorder, most recent episode (or current) unspecified, Borderline Personality Disorder  Suicidal/Homicidal: No -without intent/plan  Therapist Response: Eliannah met with clinician for an individual session. Shanicqua discussed his psychiatric symptoms and her current life events.. She shared that she had thoughts of cutting herself which she did not do. She shared that she did drink alcohol and take pills. She shared she had angry thoughts about her roommates and was very agitated. Caylen shared that she was emotionally distraught about Christmas. Clinician asked open ended questions and Darlisa shared that she has begun to feel manic. She has not been taking her mental health medication. She reports that she has an appointment with her psychiatrist. Clinician asked if she thought she needed to be hospitalized for medication management. Dalina stated that she did not, she stated she was not suicidal or homicidal. She shared that she did not currently feel like self harming. Client and clinician reviewed steps she could take if her symptoms increased or if she thought she might hurt herself or another. Client and clinician discussed self care. She stated that since last session she has not done any self care. Jissell shared ways she could practice better self care. Rosamund had the insight that she wanted to blame others or be rescued by others but that she was aware that it is her thoughts and behaviors that need to change in  order for her to "better" Ismerai and clinician discussed focusing on the good things in her life. - her daughter, housing, friends, her church.  Plan: Return again in 1 weeks.  Diagnosis: Axis I:   Bipolar I disorder, most recent episode (or current) unspecified, Borderline Personality Disorder    Sergio Hobart A, LCSW 07/18/2015

## 2015-07-19 ENCOUNTER — Ambulatory Visit (INDEPENDENT_AMBULATORY_CARE_PROVIDER_SITE_OTHER): Payer: Medicare Other | Admitting: Psychiatry

## 2015-07-19 VITALS — BP 121/82 | HR 78 | Ht 63.5 in | Wt 162.2 lb

## 2015-07-19 DIAGNOSIS — F603 Borderline personality disorder: Secondary | ICD-10-CM

## 2015-07-19 NOTE — Progress Notes (Signed)
[facility] MD Progress Note  07/19/2015 11:31 AM Morgan [name]  MRN:  [id number] Subjective: Up and down Principal Problem: Borderline personality disorder Diagnosis:  Borderline personality disorder The patient says that she is not been well. She's been more irritable more me. Her up and downs are more intense. The patient is not persistently depressed. The patient really hasn't been persistently manic. The patient has been bothered a great deal by physical illnesses. She's been diagnosed and treated for pneumonia. She just finished a few days her second antibiotic course. The patient has also had foot surgery which actually went fairly well. The patient on her own decided to stop taking Neurontin. She read about it and got scared. This is inconsistent of course with the fact that she's been taking it for long time and she asked he says it has no side effects. Her last visit we attempted to increase it from 400 mg to 600 mg but she never increased. Patient has made at least a couple attempts to injure herself. She superficially cut her right wrist and her abdomen but that's been not been for a while. The patient generally describes being me in irritable. This particular problem that she has 3 roommates one of whom is her daughter. The patient tries to stay in therapy regularly. The patient's sleep is somewhat erratic. She eventually get 6 hours asleep but is not consistent. She's eating well. She's got a fair amount of energy. She continues to work at her job in her home. The patient denies that she has a problem with alcohol. She says the only reason she drinks is to herself. For example recently she took a lot of Naprosyn with the awareness that it probably was not good to drink and take this medicine. As a result she has some abdominal complaints. The patient says she still recovering from her pneumonia. She still coughs. She was told it'll take a few more weeks. The patient is not acutely suicidal. Is not  psychotic. Patient Active Problem List   Diagnosis Date Noted  . Bronchopneumonia [J18.0] 07/10/2015  . Cough [R05] 06/19/2015  . Pre-syncope [R55] 05/17/2015  . Skull deformity [M95.2] 05/17/2015  . Headache [R51] 05/17/2015  . Patellofemoral syndrome of both knees [M22.2X1, M22.2X2] 05/02/2015  . Bipolar disorder ([place]) [F31.9] 04/15/2015  . Knee pain, bilateral [M25.561, M25.562] 03/05/2015  . Chest pain [R07.9] 03/05/2015   Total Time spent with patient: 30 minutes  Past Psychiatric History:   Past Medical History:  Past Medical History  Diagnosis Date  . Asthma   . IBS (irritable bowel syndrome) 1995  . Arthritis   . Degenerative disorder of bone   . Bulging lumbar disc 07/19/05  . Bipolar disorder ([place])   . History of borderline personality disorder   . Anxiety   . Hypothyroidism 2007    Developed after use of Lithium    Past Surgical History  Procedure Laterality Date  . Cholecystectomy    . Tubal ligation    . Abdominal hysterectomy    . Shoulder open rotator cuff repair Right 03/2010  . Bunionectomy Right 06/2012  . Appendectomy     Family History:  Family History  Problem Relation Age of Onset  . Depression Mother   . Hypertension Mother   . Post-traumatic stress disorder Sister   . Alcohol abuse Brother   . Drug abuse Brother   . Post-traumatic stress disorder Brother   . Thyroid disease Father   . Alzheimer's disease Father   . COPD  Maternal Grandmother   . Heart disease Maternal Grandfather   . Arthritis Paternal Grandmother   . Diabetes Paternal Grandmother   . Depression Paternal Grandmother   . Alzheimer's disease Paternal Grandmother   . Heart disease Paternal Grandfather   . Coronary artery disease Paternal Grandfather   . Alcohol abuse Paternal Grandfather    Family Psychiatric  History:  Social History:  History  Alcohol Use  . 0.0 oz/week  . 0 Standard drinks or equivalent per week    Comment: occ     History  Drug Use No     Social History   Social History  . Marital Status: Divorced    Spouse Name: N/A  . Number of Children: 2  . Years of Education: 14   Occupational History  . Unemployed    Social History Main Topics  . Smoking status: Former Smoker    Quit date: 07/20/1980  . Smokeless tobacco: Never Used  . Alcohol Use: 0.0 oz/week    0 Standard drinks or equivalent per week     Comment: occ  . Drug Use: No  . Sexual Activity: No     Comment: intercourse age unknown,sexual partners less than 5   Other Topics Concern  . Not on file   Social History Narrative   Fun: [employer], travel, music, writing, walking, hiking, volunteering   Denies religious beliefs effecting health care.    Feels safe at home.    Additional Social History:                         Sleep: Fair  Appetite:  Good  Current Medications: Current Outpatient Prescriptions  Medication Sig Dispense Refill  . albuterol (PROVENTIL) (5 MG/ML) 0.5% nebulizer solution Take 2.5 mg by nebulization every 6 (six) hours as needed for wheezing or shortness of breath.     . cetirizine (ZYRTEC) 10 MG tablet Take 10 mg by mouth as needed.     . clarithromycin (BIAXIN) 500 MG tablet Take 1 tablet (500 mg total) by mouth 2 (two) times daily. 10 tablet 0  . Diclofenac Sodium 2 % SOLN Apply 1 pump twice daily as needed. 112 g 3  . Ergocalciferol (VITAMIN D2) 2000 UNITS TABS Take 1 tablet daily. 90 tablet 3  . fluconazole (DIFLUCAN) 150 MG tablet Take 1 tablet (150 mg total) by mouth once. 1 tablet 0  . gabapentin (NEURONTIN) 300 MG capsule 1  bid 60 capsule 4  . hyoscyamine (LEVSIN, ANASPAZ) 0.125 MG tablet Take 0.125 mg by mouth as needed for cramping.     Marland Kitchen ibuprofen (ADVIL,MOTRIN) 100 MG tablet Take 400-800 mg by mouth every 6 (six) hours as needed for pain.    Marland Kitchen LORazepam (ATIVAN) 0.5 MG tablet 1  qday  prn (Patient taking differently: Take 0.5 mg by mouth daily as needed for anxiety. ) 10 tablet 1  .  promethazine-dextromethorphan (PROMETHAZINE-DM) 6.25-15 MG/5ML syrup Take 5 mLs by mouth 4 (four) times daily as needed. 118 mL 0  . Spacer/Aero-Holding Chambers (AEROCHAMBER PLUS) inhaler Use as instructed with inhaler as needed. 1 each 2  . Vitamin D, Ergocalciferol, (DRISDOL) 50000 UNITS CAPS capsule Take 1 capsule (50,000 Units total) by mouth every 7 (seven) days. 12 capsule 0   No current facility-administered medications for this visit.    Lab Results: No results found for this or any previous visit (from the past 48 hour(s)).  Physical Findings: AIMS:  , ,  ,  ,  CIWA:    COWS:     Musculoskeletal: Strength & Muscle Tone: within normal limits Gait & Station: normal Patient leans: Right  Psychiatric Specialty Exam: ROS  Blood pressure 121/82, pulse 78, height 5' 3.5" (1.613 m), weight 162 lb 3.2 oz (73.573 kg).Body mass index is 28.28 kg/(m^2).  General Appearance: Casual  Eye Contact::  Fair  Speech:  Clear and Coherent  Volume:  Normal  Mood:  Dysphoric  Affect:  Appropriate  Thought Process:  Coherent  Orientation:  Full (Time, Place, and Person)  Thought Content:  WDL  Suicidal Thoughts:  No  Homicidal Thoughts:  No  Memory:  NA  Judgement:  NA  Insight:  Lacking  Psychomotor Activity:  Normal  Concentration:  Good  Recall:  Good  Fund of Knowledge:Fair  Language: Fair  Akathisia:  No  Handed:  Right  AIMS (if indicated):     Assets:  Desire for Improvement  ADL's:  Intact  Cognition: WNL  Sleep:      Treatment Plan Summary: At this time the patient is near her baseline. At this time the patient presents being very characterological. At this time I position is that she is still recovering from her physical conditions. She is resistant to taking any medications. She's glands see her therapist on a regular basis and she does come regularly. The patient is taking Depakote in the past and got confused. She doesn't like it. She believes she took lithium but  also is effectiveness. The patient is unaware if she took Tegretol ever. At this time I think the patient has a substance abuse problem related to alcohol. I think she clearly has mood instability. I think Neurontin is the best medicine for her and she does not have a good reason not to take it. At one point she was taking it and felt good about it. Even this time she said since she stopped the Neurontin contact she feels more anxious. I transiently given her small when necessary dose of Ativan which at this time I will not do. I think the only medication I can imagine offering her is Neurontin. She claims that she thought was hard to come off of it and I told her that simply was not true. I think all self-destructive behavior this patient is minor at this point. I do not believe she is suicidal. This patient she'll return to see me in 5 or 6 weeks we will again talk about restarting her Neurontin. The other possible agents to consider is Trileptal or Tegretol.  Analeise Mccleery, [place] 07/19/2015, 11:31 AM

## 2015-07-29 ENCOUNTER — Encounter: Payer: Self-pay | Admitting: Gastroenterology

## 2015-07-29 ENCOUNTER — Ambulatory Visit (INDEPENDENT_AMBULATORY_CARE_PROVIDER_SITE_OTHER): Payer: Medicare Other | Admitting: Gastroenterology

## 2015-07-29 VITALS — BP 120/60 | HR 66 | Ht 63.5 in | Wt 163.4 lb

## 2015-07-29 DIAGNOSIS — R1310 Dysphagia, unspecified: Secondary | ICD-10-CM

## 2015-07-29 DIAGNOSIS — K921 Melena: Secondary | ICD-10-CM

## 2015-07-29 NOTE — Progress Notes (Signed)
HPI: This is a   very pleasant 50 year old woman   who was referred to me by   Dr. Toney Rakes  to evaluate   Hemoccult-positive stool.    Chief complaint is Hemoccult-positive stool, mild intermittent GERD, mild intermittent solid food dysphagia  She says she has IBS.    She took a lot of naproxen several weeks, months.   she has extensive psychiatric history. She says she has impulsive behavior tries to hurt herself sometimes. She has done this at times by drinking various solvents.  She drinks toilette bowl cleaner (in May 2017) and other cleaners at times (her whole life).  She had a colonoscopy in 10/2011 in [place]. She understands that "benign polyps" were removed. I don't have a copy of those reports at the time of this visit.  She recently had colon cancer screening performed with home fecal occult blood testing through her gynecologist.  This was +.  Was part of her yearly exam.  She doesn't see blood in her stool.    Changes in bowels recently since on Antibiotics for pneumonia.  Has lost 10 pounds, intentionally.  She has had some difficulty swallowing recently.  Has solid food dysphagia occasionally.  This occurred intermittently for about a week.  Has mild pyrosis. Taking an aloe product with good relief.  Labs 02/2015: not anemic  Review of systems: Pertinent positive and negative review of systems were noted in the above HPI section. Complete review of systems was performed and was otherwise normal.   Past Medical History  Diagnosis Date  . Asthma   . IBS (irritable bowel syndrome) 1995  . Arthritis   . Degenerative disorder of bone   . Bulging lumbar disc 07/19/05  . Bipolar disorder ([place])   . History of borderline personality disorder   . Anxiety   . Hypothyroidism 2007    Developed after use of Lithium  . Chronic headaches   . Colon polyps   . Proctitis   . Depression   . Pneumonia     Past Surgical History  Procedure Laterality Date   . Cholecystectomy    . Tubal ligation    . Abdominal hysterectomy    . Shoulder open rotator cuff repair Right 03/2010  . Bunionectomy Right 06/2012  . Appendectomy      Current Outpatient Prescriptions  Medication Sig Dispense Refill  . albuterol (PROVENTIL HFA;VENTOLIN HFA) 108 (90 Base) MCG/ACT inhaler Inhale into the lungs every 6 (six) hours as needed for wheezing or shortness of breath.    . Fluticasone Furoate-Vilanterol (BREO ELLIPTA) 100-25 MCG/INH AEPB Inhale into the lungs as needed.    . hyoscyamine (LEVSIN, ANASPAZ) 0.125 MG tablet Take 0.125 mg by mouth as needed for cramping.     Marland Kitchen ibuprofen (ADVIL,MOTRIN) 100 MG tablet Take 400-800 mg by mouth every 6 (six) hours as needed for pain.    Marland Kitchen LORazepam (ATIVAN) 0.5 MG tablet 1  qday  prn (Patient taking differently: Take 0.5 mg by mouth daily as needed for anxiety. ) 10 tablet 1  . OVER THE COUNTER MEDICATION Reflux relief , take one as needed     No current facility-administered medications for this visit.    Allergies as of 07/29/2015 - Review Complete 07/29/2015  Allergen Reaction Noted  . Abilify [aripiprazole] Swelling 01/10/2015  . Ciprofloxacin Shortness Of Breath 01/10/2015  . Lamictal [lamotrigine] Rash 01/10/2015  . Amoxicillin Hives 01/10/2015  . Doxycycline Hives 01/10/2015  . Latex Rash 01/10/2015  . Meloxicam  Other (See Comments) 01/10/2015  . Mirapex [pramipexole] Hives 01/10/2015  . Prednisone Other (See Comments) 03/08/2015  . Risperdal [risperidone] Hives 01/10/2015  . Tape Rash 03/08/2015  . Cortisone  03/05/2015  . Percocet [oxycodone-acetaminophen] Other (See Comments) 06/19/2015  . Sulfa antibiotics Rash 01/10/2015    Family History  Problem Relation Age of Onset  . Depression Mother   . Hypertension Mother   . Post-traumatic stress disorder Sister   . Alcohol abuse Brother   . Drug abuse Brother   . Post-traumatic stress disorder Brother   . Thyroid disease Father   . Alzheimer's  disease Father   . COPD Maternal Grandmother   . Heart disease Maternal Grandfather   . Arthritis Paternal Grandmother   . Diabetes Paternal Grandmother   . Depression Paternal Grandmother   . Alzheimer's disease Paternal Grandmother   . Heart disease Paternal Grandfather   . Coronary artery disease Paternal Grandfather   . Alcohol abuse Paternal Grandfather     Social History   Social History  . Marital Status: Divorced    Spouse Name: N/A  . Number of Children: 2  . Years of Education: 14   Occupational History  . Unemployed    Social History Main Topics  . Smoking status: Former Smoker    Quit date: 07/20/1980  . Smokeless tobacco: Never Used  . Alcohol Use: 0.0 oz/week    0 Standard drinks or equivalent per week     Comment: occ  . Drug Use: No  . Sexual Activity: No     Comment: intercourse age unknown,sexual partners less than 5   Other Topics Concern  . Not on file   Social History Narrative   Fun: [employer], travel, music, writing, walking, hiking, volunteering   Denies religious beliefs effecting health care.    Feels safe at home.      Physical Exam: BP 120/60 mmHg  Pulse 66  Ht 5' 3.5" (1.613 m)  Wt 163 lb 6.4 oz (74.118 kg)  BMI 28.49 kg/m2 Constitutional: generally well-appearing Psychiatric: alert and oriented x3 Eyes: extraocular movements intact Mouth: oral pharynx moist, no lesions Neck: supple no lymphadenopathy Cardiovascular: heart regular rate and rhythm Lungs: clear to auscultation bilaterally Abdomen: soft, nontender, nondistended, no obvious ascites, no peritoneal signs, normal bowel sounds Extremities: no lower extremity edema bilaterally Skin: no lesions on visible extremities   Assessment and plan: 50 y.o. female with  Hemoccult-positive stool, intermittent solid food dysphagia, mild GERD   I think she has been overscreened for colon cancer. She has had at least one or 2 colonoscopies with the most recent in 2013. We don't have  those records here for review but we'll get those sent. She tells me she had polyps removed and was told to have another colonoscopy at five-year interval. Home stool testing for colon cancer was done in the interim and it was positive. I explained to her that with that positive result I recommended she repeat colonoscopy now instead of 2018 as she was previously scheduled. I explained it is unlikely there is anything serious fortunately. She also has some intermittent dysphasia. She has lost 10 pounds since that is intentional. Perhaps her dysphagia is from GERD however she has an interesting history of drinking various solvents including football cleaner and attempt to harm herself. This raises the possibility of benign strictures of the esophagus. I'll proceed with EGD at the same time as her colonoscopy above.   Owens Loffler, MD [facility] 07/29/2015, 2:09 PM  Cc:  Binnie Rail, MD

## 2015-07-29 NOTE — Patient Instructions (Addendum)
We will get records sent from your previous gastroenterologist (Dr. Lurlean Horns, [place]) for review.  This will include any endoscopic (colonoscopy or upper endoscopy) procedures and any associated pathology reports.   You will be set up for a colonoscopy for hemocult + stool. You will be set up for an upper endoscopy for dysphagia.

## 2015-07-30 ENCOUNTER — Telehealth: Payer: Self-pay | Admitting: Gastroenterology

## 2015-07-30 ENCOUNTER — Ambulatory Visit (HOSPITAL_COMMUNITY): Payer: Self-pay | Admitting: Clinical

## 2015-07-30 NOTE — Telephone Encounter (Signed)
Colonoscopy April 2013, Dr. Trilby Drummer in [facility], indications history of ulcerative proctitis, anal or rectal pain. He noted a 4 mm polyp in the transverse colon that was removed. This was proven to be a sessile serrated adenoma on pathology. He noted scarring in the rectum compatible with an active ulcerative proctitis. Otherwise normal mucosa throughout the colon. Biopsies were taken randomly. He noted internal hemorrhoids. Random biopsies were normal.

## 2015-08-02 ENCOUNTER — Ambulatory Visit (INDEPENDENT_AMBULATORY_CARE_PROVIDER_SITE_OTHER): Payer: Medicare Other | Admitting: Podiatry

## 2015-08-02 ENCOUNTER — Ambulatory Visit (INDEPENDENT_AMBULATORY_CARE_PROVIDER_SITE_OTHER): Payer: Medicare Other

## 2015-08-02 ENCOUNTER — Telehealth (HOSPITAL_COMMUNITY): Payer: Self-pay | Admitting: Clinical

## 2015-08-02 DIAGNOSIS — M205X2 Other deformities of toe(s) (acquired), left foot: Secondary | ICD-10-CM

## 2015-08-02 DIAGNOSIS — Z9889 Other specified postprocedural states: Secondary | ICD-10-CM

## 2015-08-02 NOTE — Telephone Encounter (Signed)
     Morgan [name] called to say she was experiencing anger. She asked for advice about what to do about it. Clinician reminded her of her emotional regulation techn. She wasn't happy about being reminded and wanted clinician to do more. Morgan [name] had wanted clinician to convince her to keep her obligation to follow through on volunteer work she had signed up for. Clinician reminded her she would be helping others and would also feel better if she did. Morgan [name] was frustrated and she ended the conversation.

## 2015-08-04 NOTE — Progress Notes (Signed)
Subjective:     Patient ID: Morgan [name], female   DOB: 21-Dec-1965, 50 y.o.   MRN: [id number]  HPI patient states I'm doing very well with minimal discomfort and able to walk and I'm very satisfied with results with occasional discomfort   Review of Systems     Objective:   Physical Exam Neurovascular status intact with excellent range of motion first MPJ with mild edema which is normal for this. Postop    Assessment:     Doing well post osteotomy left first metatarsal with mild symptoms normal for this. Postop    Plan:     Reviewed x-rays and gradual return to all normal activity and explained discomfort can last for anywhere from 6 months to one year. Reappoint if symptoms were to get worse

## 2015-08-05 ENCOUNTER — Other Ambulatory Visit: Payer: Medicare Other

## 2015-08-06 DIAGNOSIS — F3181 Bipolar II disorder: Secondary | ICD-10-CM | POA: Diagnosis not present

## 2015-08-07 ENCOUNTER — Encounter (HOSPITAL_COMMUNITY): Payer: Self-pay | Admitting: Clinical

## 2015-08-07 ENCOUNTER — Ambulatory Visit (INDEPENDENT_AMBULATORY_CARE_PROVIDER_SITE_OTHER): Payer: Medicare Other | Admitting: Clinical

## 2015-08-07 DIAGNOSIS — F603 Borderline personality disorder: Secondary | ICD-10-CM

## 2015-08-07 DIAGNOSIS — F319 Bipolar disorder, unspecified: Secondary | ICD-10-CM

## 2015-08-07 NOTE — Progress Notes (Signed)
   THERAPIST PROGRESS NOTE  Session Time: 11:10 - 12:05  Participation Level: Active  Behavioral Response: CasualAlertAnxious and Depressed  Type of Therapy: Individual Therapy  Treatment Goals addressed: improve psychiatric symptoms, Improve unhelpful thinking patterns, reduce unhealthy behaviors/increase healthy behaviors  Interventions: CBT and Motivational Interviewing, Grounding and Mindfulness Techniques  Summary: Morgan [name] is a 50 y.o. female who presents with Bipolar I disorder, most recent episode (or current) unspecified, Borderline Personality Disorder  Suicidal/Homicidal: No -without intent/plan  Therapist Response: Morgan [name] met with clinician for an individual session. Morgan [name] discussed his psychiatric symptoms, her current life events and her homework.  She had been off her mental health  medications for several weeks and had been more and more symptomatic. Morgan [name] also shared that she is starting to see darting shadows and is emotionally numb.  She shared that she went to a new psychiatrist today after seeing her regular psychiatrist on two weeks ago. Morgan [name] stated that he did not give her the medication that she wanted. Morgan [name] shared that her new psychiatrist gave her Ativan to be taken as needed. Morgan [name] took one upon starting the therapy session saying she was anxious (she was sleepy at the end of the session). She shared that in addition to being prescribed the Ativan (30 count) she will be starting back on Lithium. Clinician voiced some concern about her taking Ativan and drinking. Morgan [name] said that she has only been drinking lately to help her sleep though she had told clinician about other instances of drinking recently. She also has a history of taking medication and drinking too much ( staff called police 16/1/09, also shared she had done this on other occassions). Morgan [name] stated that her plan was to take the Ativan instead of drinking and avoid the "Icky" taste of  alcohol. Morgan [name] shared that she felt like a failure because she needs medication "not to be an awful person." she shared that the tiniest thing sets her off and wrecks her whole day. Morgan [name] gave some examples. Morgan [name] and clinician discussed the use of healthy coping skills. Morgan [name] shared her belief that the coping skills she used were only ones available ( walking, music or drinking). Walking is a healthy coping skill though Morgan [name] is under doctor's orders to limit her walking, Music can be good depending on  How she use it, drinking is detrimental to her mental health. She reported that Thursday she had suicidal thoughts, she shared that she went for a walk and had a plan to do it. She shared that her anger dissipated and it turned to "something else". Morgan [name] and clinician discussed the nature of emotions. Morgan [name] and clinician discussed additional skills she could use to improve he mood.  Morgan [name] denied any suicidal or homicidal ideation. She reported that she has the numbers for Crisis should she need them.    Plan: Return again in 1 weeks.  Diagnosis: Axis I:   Bipolar I disorder, most recent episode (or current) unspecified, Borderline Personality Disorder   Brandi Tomlinson A, LCSW 08/07/2015

## 2015-08-13 ENCOUNTER — Encounter (HOSPITAL_COMMUNITY): Payer: Self-pay | Admitting: Clinical

## 2015-08-13 ENCOUNTER — Emergency Department (HOSPITAL_COMMUNITY)
Admission: EM | Admit: 2015-08-13 | Discharge: 2015-08-13 | Disposition: A | Discharge status: Home / Self Care | Payer: Medicare Other | Attending: Emergency Medicine | Admitting: Emergency Medicine

## 2015-08-13 ENCOUNTER — Encounter (HOSPITAL_COMMUNITY): Payer: Self-pay | Admitting: Emergency Medicine

## 2015-08-13 ENCOUNTER — Emergency Department (HOSPITAL_COMMUNITY)
Admission: EM | Admit: 2015-08-13 | Discharge: 2015-08-13 | Disposition: A | Discharge status: Home / Self Care | Payer: Medicare Other

## 2015-08-13 ENCOUNTER — Telehealth: Payer: Self-pay | Admitting: Internal Medicine

## 2015-08-13 ENCOUNTER — Ambulatory Visit (INDEPENDENT_AMBULATORY_CARE_PROVIDER_SITE_OTHER): Payer: Medicare Other | Admitting: Clinical

## 2015-08-13 ENCOUNTER — Emergency Department (HOSPITAL_COMMUNITY): Payer: Medicare Other

## 2015-08-13 ENCOUNTER — Telehealth (HOSPITAL_COMMUNITY): Payer: Self-pay

## 2015-08-13 DIAGNOSIS — Z8739 Personal history of other diseases of the musculoskeletal system and connective tissue: Secondary | ICD-10-CM | POA: Insufficient documentation

## 2015-08-13 DIAGNOSIS — Z8639 Personal history of other endocrine, nutritional and metabolic disease: Secondary | ICD-10-CM | POA: Insufficient documentation

## 2015-08-13 DIAGNOSIS — Z86010 Personal history of colonic polyps: Secondary | ICD-10-CM | POA: Insufficient documentation

## 2015-08-13 DIAGNOSIS — S9001XA Contusion of right ankle, initial encounter: Secondary | ICD-10-CM | POA: Diagnosis not present

## 2015-08-13 DIAGNOSIS — F603 Borderline personality disorder: Secondary | ICD-10-CM | POA: Diagnosis not present

## 2015-08-13 DIAGNOSIS — Z79899 Other long term (current) drug therapy: Secondary | ICD-10-CM | POA: Diagnosis not present

## 2015-08-13 DIAGNOSIS — Y9289 Other specified places as the place of occurrence of the external cause: Secondary | ICD-10-CM | POA: Insufficient documentation

## 2015-08-13 DIAGNOSIS — M79671 Pain in right foot: Secondary | ICD-10-CM | POA: Diagnosis not present

## 2015-08-13 DIAGNOSIS — G8929 Other chronic pain: Secondary | ICD-10-CM | POA: Insufficient documentation

## 2015-08-13 DIAGNOSIS — Z8719 Personal history of other diseases of the digestive system: Secondary | ICD-10-CM | POA: Insufficient documentation

## 2015-08-13 DIAGNOSIS — J45909 Unspecified asthma, uncomplicated: Secondary | ICD-10-CM | POA: Diagnosis not present

## 2015-08-13 DIAGNOSIS — Z880 Allergy status to penicillin: Secondary | ICD-10-CM | POA: Diagnosis not present

## 2015-08-13 DIAGNOSIS — Z8701 Personal history of pneumonia (recurrent): Secondary | ICD-10-CM | POA: Insufficient documentation

## 2015-08-13 DIAGNOSIS — S93401A Sprain of unspecified ligament of right ankle, initial encounter: Secondary | ICD-10-CM | POA: Insufficient documentation

## 2015-08-13 DIAGNOSIS — Y9389 Activity, other specified: Secondary | ICD-10-CM | POA: Insufficient documentation

## 2015-08-13 DIAGNOSIS — S8991XA Unspecified injury of right lower leg, initial encounter: Secondary | ICD-10-CM | POA: Diagnosis not present

## 2015-08-13 DIAGNOSIS — Z87891 Personal history of nicotine dependence: Secondary | ICD-10-CM | POA: Diagnosis not present

## 2015-08-13 DIAGNOSIS — S8992XA Unspecified injury of left lower leg, initial encounter: Secondary | ICD-10-CM | POA: Diagnosis not present

## 2015-08-13 DIAGNOSIS — S79911A Unspecified injury of right hip, initial encounter: Secondary | ICD-10-CM | POA: Diagnosis not present

## 2015-08-13 DIAGNOSIS — F419 Anxiety disorder, unspecified: Secondary | ICD-10-CM | POA: Diagnosis not present

## 2015-08-13 DIAGNOSIS — W108XXA Fall (on) (from) other stairs and steps, initial encounter: Secondary | ICD-10-CM | POA: Diagnosis not present

## 2015-08-13 DIAGNOSIS — Z91040 Latex allergy status: Secondary | ICD-10-CM | POA: Diagnosis not present

## 2015-08-13 DIAGNOSIS — Y998 Other external cause status: Secondary | ICD-10-CM | POA: Diagnosis not present

## 2015-08-13 DIAGNOSIS — F319 Bipolar disorder, unspecified: Secondary | ICD-10-CM | POA: Diagnosis not present

## 2015-08-13 DIAGNOSIS — S9031XA Contusion of right foot, initial encounter: Secondary | ICD-10-CM | POA: Diagnosis not present

## 2015-08-13 DIAGNOSIS — S99921A Unspecified injury of right foot, initial encounter: Secondary | ICD-10-CM | POA: Diagnosis present

## 2015-08-13 MED ORDER — IBUPROFEN 600 MG PO TABS: 600.0000 mg | ORAL_TABLET | Freq: Four times a day (QID) | ORAL | Status: DC | PRN

## 2015-08-13 NOTE — ED Notes (Signed)
Pt states that she just had surgery on her lt foot surgery about a month ago.  States she fell down the stairs yesterday and is now having pain and swelling to rt ankle.

## 2015-08-13 NOTE — Discharge Instructions (Signed)
You were evaluated in the ED today and found to have an ankle sprain. Your x-ray was negative for any broken bones or dislocations. You may use her boot as we discussed. Take your medications as prescribed. Follow-up with your doctor for reevaluation this week. Return to ED for any new or worsening symptoms.  Ankle Sprain An ankle sprain is an injury to the strong, fibrous tissues (ligaments) that hold the bones of your ankle joint together.  CAUSES An ankle sprain is usually caused by a fall or by twisting your ankle. Ankle sprains most commonly occur when you step on the outer edge of your foot, and your ankle turns inward. People who participate in sports are more prone to these types of injuries.  SYMPTOMS   Pain in your ankle. The pain may be present at rest or only when you are trying to stand or walk.  Swelling.  Bruising. Bruising may develop immediately or within 1 to 2 days after your injury.  Difficulty standing or walking, particularly when turning corners or changing directions. DIAGNOSIS  Your caregiver will ask you details about your injury and perform a physical exam of your ankle to determine if you have an ankle sprain. During the physical exam, your caregiver will press on and apply pressure to specific areas of your foot and ankle. Your caregiver will try to move your ankle in certain ways. An X-ray exam may be done to be sure a bone was not broken or a ligament did not separate from one of the bones in your ankle (avulsion fracture).  TREATMENT  Certain types of braces can help stabilize your ankle. Your caregiver can make a recommendation for this. Your caregiver may recommend the use of medicine for pain. If your sprain is severe, your caregiver may refer you to a surgeon who helps to restore function to parts of your skeletal system (orthopedist) or a physical therapist. Deputy ice to your injury for 1-2 days or as directed by your caregiver.  Applying ice helps to reduce inflammation and pain.  Put ice in a plastic bag.  Place a towel between your skin and the bag.  Leave the ice on for 15-20 minutes at a time, every 2 hours while you are awake.  Only take over-the-counter or prescription medicines for pain, discomfort, or fever as directed by your caregiver.  Elevate your injured ankle above the level of your heart as much as possible for 2-3 days.  If your caregiver recommends crutches, use them as instructed. Gradually put weight on the affected ankle. Continue to use crutches or a cane until you can walk without feeling pain in your ankle.  If you have a plaster splint, wear the splint as directed by your caregiver. Do not rest it on anything harder than a pillow for the first 24 hours. Do not put weight on it. Do not get it wet. You may take it off to take a shower or bath.  You may have been given an elastic bandage to wear around your ankle to provide support. If the elastic bandage is too tight (you have numbness or tingling in your foot or your foot becomes cold and blue), adjust the bandage to make it comfortable.  If you have an air splint, you may blow more air into it or let air out to make it more comfortable. You may take your splint off at night and before taking a shower or bath. Wiggle your toes in  the splint several times per day to decrease swelling. SEEK MEDICAL CARE IF:   You have rapidly increasing bruising or swelling.  Your toes feel extremely cold or you lose feeling in your foot.  Your pain is not relieved with medicine. SEEK IMMEDIATE MEDICAL CARE IF:  Your toes are numb or blue.  You have severe pain that is increasing. MAKE SURE YOU:   Understand these instructions.  Will watch your condition.  Will get help right away if you are not doing well or get worse.   This information is not intended to replace advice given to you by your health care provider. Make sure you discuss any  questions you have with your health care provider.   Document Released: 07/06/2005 Document Revised: 07/27/2014 Document Reviewed: 07/18/2011 Elsevier Interactive Patient Education [employer].

## 2015-08-13 NOTE — Telephone Encounter (Signed)
Patient Name: Morgan [name]  DOB: 1965-08-26    Initial Comment Caller states she fell down some stairs last night, injured ankle, still hurting, swollen and discolored.   Nurse Assessment  Nurse: Leilani Merl, RN, Heather Date/Time (Eastern Time): 08/13/2015 2:22:19 PM  Confirm and document reason for call. If symptomatic, describe symptoms. You must click the next button to save text entered. ---Caller states she fell down some stairs last night, injured right ankle, still hurting, swollen and discolored.  Has the patient traveled out of the country within the last 30 days? ---Not Applicable  Does the patient have any new or worsening symptoms? ---Yes  Will a triage be completed? ---Yes  Related visit to physician within the last 2 weeks? ---No  Does the PT have any chronic conditions? (i.e. diabetes, asthma, etc.) ---Yes  List chronic conditions. ---see MR  Did the patient indicate they were pregnant? ---No  Is this a behavioral health or substance abuse call? ---No     Guidelines    Guideline Title Affirmed Question Affirmed Notes  Foot and Ankle Injury Sounds like a serious injury to the triager    Final Disposition User   Go to ED Now [place], RN, [facility] - ED   Disagree/Comply: Comply

## 2015-08-13 NOTE — Progress Notes (Signed)
   THERAPIST PROGRESS NOTE  Session Time: 9:20 - 9:55  Participation Level: Minimal  Behavioral Response: DisheveledDrowsyDepressed and intoxicated  Type of Therapy: Individual Therapy  Treatment Goals addressed: Crisis care  Interventions:  Motivational Interviewing and crisis care  Summary: Morgan [name] is a 50 y.o. female who presents with Bipolar I disorder, most recent episode (or current) unspecified, Borderline Personality Disorder  Suicidal/Homicidal: No -without intent/plan - Ezra denied any suicidal or homicidal ideation  Therapist Response: Morgan [name] met with clinician for an individual session. Morgan [name] discussed his psychiatric symptoms and  her current life events. Morgan [name] was lethargic and mumbling.Clinician asked her if she had been drinking which she replied yes,Clinician asked how much Morgan [name] replied she didn't know. Clinician asked if she had also taken ativan which she had replied yes. When asked how much she said shook her head. When asked for more information she was lethargic. (she was prescribed 30 ativan last week) Clinician asked Nurse Beather Arbour to join session. He asked her about her drug and alcohol intake. She stated that she was out of ativan as of Monday. She stated Sunday she had taken 7-8 pills. She reported that she has been drinking and taking the pills at the same time. Nurse shared concerns about wellbeing and possible withdrawal symptoms (seizures).  Clinician offered to take her to be evaluated at ED, behavioral health inpatient.  She stated she was not interested in being evaluated. Nurse restated his concerns as well as listed some options for evaluation. Morgan [name] agreed to speak to the prescribing doctor and inform them of her use and alcohol intake to see if they could assist her in managing so that she does not suffer withdrawal symptoms. Morgan [name] stated she was not suicidal or homicidal and knows what to do if she needs assistance. After Nurse left  the room . Morgan [name] told clinician that she had fallen down some stairs the other night and hurt her ankle. Morgan [name] shared her daughter had been thinking of calling the cops because of concern about her and her whereabouts. Morgan [name] said she was upset and depressed about Trump becoming president. She stated again that she was not suicidal. She shared she was leaving and not going to the ER but home.  Note - Morgan [name] did not begin taking Lithium as planned last week.    Plan: Return again in 1 weeks.  Diagnosis: Axis I:   Bipolar I disorder, most recent episode (or current) unspecified, Borderline Personality Disorder    Admire Bunnell A, LCSW 08/13/2015

## 2015-08-13 NOTE — Telephone Encounter (Signed)
Face-to-face with patient with [name], therapist who requested this nurse step into their individual therapy session after patient reported to therapist she had been drinking this morning and taking Ativan.  Patient admitted she recently stopped seeing Dr. Casimiro Needle and had gone to her first evaluation at [facility] with a provider there that she reports provided her with Ativan 0.5 mg, #30 to take as needed.  Patient reports she has taken all 30 pills over the past week with last 2 pills 08/12/15 at night and before that 7-8 pills on Sunday 08/11/15 that she admits some alcohol use with then.  Discussed with patient concern for potential serious withdrawal problems from Ativan as she reported no history of seizures or DTs and refused to be evaluated at [facility] or a local ED or any medical facility at this time for possible needed detox assistance.   Patient stated she would call the new prescribing providers office back this date and inform of her over use and some alcohol use with [place] over the past week and to question if they would assist with further medications and possible needed taper assistance.  Patient refused to go with therapist to be evaluated this date and denied concern for detox needed.  Patient with no current symptoms of distress as no agitation, shakiness, diaphoresis, etc. noted in her appearance.  Patient calmly reported her recent use to therapist and agreed with plan to at least follow up with her new prescribing psychiatrist as again refused this nurse's concerns to be evaluated for medical clearance this date.  Patient denied any suicidal or homicidal ideations and stayed to complete therapy session with therapist.  Collateral left facility without any noted distress and reported she does not drive.  Patient to call [facility] to follow up on continued plan for treatment and possible needed refill or taper assistance from Ativan.  Patient minimized alcohol use this date as  reported took one drink with tea and breakfast.

## 2015-08-13 NOTE — ED Provider Notes (Signed)
History  By signing my name below, I, Marlowe Kays, attest that this documentation has been prepared under the direction and in the presence of [employer], PA-C. Electronically Signed: Marlowe Kays, ED Scribe. 08/13/2015. 5:16 PM  Chief Complaint  Patient presents with  . Foot Pain   The history is provided by the patient and medical records. No language interpreter was used.    HPI Comments:  Morgan [name] is a 50 y.o. female who presents to the Emergency Department complaining of worsening moderate right foot pain that began yesterday secondary to falling down a few stairs. She reports associated swelling of the right ankle. She reports bilateral knee pain from falling onto them. She reports right hip pain as well. She reports that she was able to ambulate without assistance after the fall. She states she has applied ice to the area. Walking down stairs increases the pain. She denies alleviating factors. She denies numbness, tingling or weakness of the right foot or wounds. She states she recently had surgery on the left foot about one month ago. She states she was seen by her PCP and was advised to come here and be checked. Pt has cam walker with her that she used for her left foot. She reports multiple allergies but states she can take Motrin without issue.  Past Medical History  Diagnosis Date  . Asthma   . IBS (irritable bowel syndrome) 1995  . Arthritis   . Degenerative disorder of bone   . Bulging lumbar disc 07/19/05  . Bipolar disorder ([place])   . History of borderline personality disorder   . Anxiety   . Hypothyroidism 2007    Developed after use of Lithium  . Chronic headaches   . Colon polyps   . Proctitis   . Depression   . Pneumonia    Past Surgical History  Procedure Laterality Date  . Cholecystectomy    . Tubal ligation    . Abdominal hysterectomy    . Shoulder open rotator cuff repair Right 03/2010  . Bunionectomy Right 06/2012  . Appendectomy      Family History  Problem Relation Age of Onset  . Depression Mother   . Hypertension Mother   . Post-traumatic stress disorder Sister   . Alcohol abuse Brother   . Drug abuse Brother   . Post-traumatic stress disorder Brother   . Thyroid disease Father   . Alzheimer's disease Father   . COPD Maternal Grandmother   . Heart disease Maternal Grandfather   . Arthritis Paternal Grandmother   . Diabetes Paternal Grandmother   . Depression Paternal Grandmother   . Alzheimer's disease Paternal Grandmother   . Heart disease Paternal Grandfather   . Coronary artery disease Paternal Grandfather   . Alcohol abuse Paternal Grandfather    Social History  Substance Use Topics  . Smoking status: Former Smoker    Quit date: 07/20/1980  . Smokeless tobacco: Never Used  . Alcohol Use: 0.0 oz/week    0 Standard drinks or equivalent per week     Comment: occ   OB History    Gravida Para Term Preterm AB TAB SAB Ectopic Multiple Living   2 2 2  0 0 0 0 0 0 2     Review of Systems A complete 10 system review of systems was obtained and all systems are negative except as noted in the HPI and PMH.   Allergies  Abilify; Ciprofloxacin; Lamictal; Amoxicillin; Doxycycline; Latex; Meloxicam; Mirapex; Prednisone; Risperdal; Tape; Cortisone; Percocet; and  Sulfa antibiotics  Home Medications   Prior to Admission medications   Medication Sig Start Date End Date Taking? Authorizing Provider  albuterol (PROVENTIL HFA;VENTOLIN HFA) 108 (90 Base) MCG/ACT inhaler Inhale into the lungs every 6 (six) hours as needed for wheezing or shortness of breath.    Historical Provider, MD  Fluticasone Furoate-Vilanterol (BREO ELLIPTA) 100-25 MCG/INH AEPB Inhale into the lungs as needed.    Historical Provider, MD  hyoscyamine (LEVSIN, ANASPAZ) 0.125 MG tablet Take 0.125 mg by mouth as needed for cramping.     Historical Provider, MD  ibuprofen (ADVIL,MOTRIN) 600 MG tablet Take 1 tablet (600 mg total) by mouth every 6  (six) hours as needed. 08/13/15   Comer Locket, PA-C  LORazepam (ATIVAN) 0.5 MG tablet 1  qday  prn Patient taking differently: Take 0.5 mg by mouth daily as needed for anxiety.  02/20/15   Norma Fredrickson, MD  OVER THE COUNTER MEDICATION Reflux relief , take one as needed    Historical Provider, MD   Triage Vitals: BP 121/83 mmHg  Pulse 100  Temp(Src) 98.4 F (36.9 C) (Oral)  Resp 18  SpO2 100% Physical Exam  Constitutional: She is oriented to person, place, and time. She appears well-developed and well-nourished.  HENT:  Head: Normocephalic and atraumatic.  Eyes: EOM are normal.  Neck: Normal range of motion.  Cardiovascular: Normal rate and intact distal pulses.   Distal pulses of right foot intact. Brisk cap refill of right foot.  Pulmonary/Chest: Effort normal.  Musculoskeletal: Normal range of motion. She exhibits tenderness.  Diffuse swelling of right ankle. Moderate ecchymosis throughout lateral malleolus. Full range of motion of right knee without any tenderness.  Neurological: She is alert and oriented to person, place, and time.  Skin: Skin is warm and dry.  Psychiatric: She has a normal mood and affect. Her behavior is normal.  Nursing note and vitals reviewed.   ED Course  Procedures (including critical care time) DIAGNOSTIC STUDIES: Oxygen Saturation is 100% on RA, normal by my interpretation.   COORDINATION OF CARE: 4:46 PM- Will wait for imaging to result. Advised pt to wear her cam walker boot and continue to ice the area and follow up with her orthopedist. Pt verbalizes understanding and agrees to plan.  Medications - No data to display  Imaging Review Dg Foot Complete Right  08/13/2015  CLINICAL DATA:  Patient status post fall with bruising and pain along the lateral aspect of the foot. EXAM: RIGHT FOOT COMPLETE - 3+ VIEW COMPARISON:  None. FINDINGS: Postsurgical changes involving the distal first metatarsal. Normal anatomic alignment. No evidence for acute  fracture or dislocation. Midfoot degenerative changes. No significant soft tissue abnormality. IMPRESSION: No acute osseous abnormality Electronically Signed   By: Lovey Newcomer M.D.   On: 08/13/2015 16:44   I have personally reviewed and evaluated these images and lab results as part of my medical decision-making.   MDM   Final diagnoses:  Ankle sprain, right, initial encounter    Patient presents for evaluation of right ankle pain after fall. Patient X-Ray negative for obvious fracture or dislocation.  Suspect mild sprain. Remains neurovascularly intact. Pt advised to follow up with orthopedics/PCP. Patient prefers to use the cam walker that she brought with her, conservative therapy recommended and discussed. Patient will be discharged home & is agreeable with above plan. Returns precautions discussed. Pt appears safe for discharge.   I personally performed the services described in this documentation, which was scribed in my presence. The recorded  information has been reviewed and is accurate.     Comer Locket, PA-C 08/13/15 [place], DO 08/13/15 [phone]

## 2015-08-14 ENCOUNTER — Telehealth (HOSPITAL_COMMUNITY): Payer: Self-pay | Admitting: Clinical

## 2015-08-14 ENCOUNTER — Emergency Department (HOSPITAL_COMMUNITY)
Admission: EM | Admit: 2015-08-14 | Discharge: 2015-08-15 | Disposition: A | Discharge status: Other Acute Inpatient Hospital | Payer: Medicare Other | Attending: Emergency Medicine | Admitting: Emergency Medicine

## 2015-08-14 ENCOUNTER — Encounter (HOSPITAL_COMMUNITY): Payer: Self-pay | Admitting: Emergency Medicine

## 2015-08-14 DIAGNOSIS — F1010 Alcohol abuse, uncomplicated: Secondary | ICD-10-CM | POA: Diagnosis not present

## 2015-08-14 DIAGNOSIS — F1310 Sedative, hypnotic or anxiolytic abuse, uncomplicated: Secondary | ICD-10-CM | POA: Diagnosis not present

## 2015-08-14 DIAGNOSIS — Z91040 Latex allergy status: Secondary | ICD-10-CM | POA: Insufficient documentation

## 2015-08-14 DIAGNOSIS — J45909 Unspecified asthma, uncomplicated: Secondary | ICD-10-CM | POA: Diagnosis not present

## 2015-08-14 DIAGNOSIS — Z79899 Other long term (current) drug therapy: Secondary | ICD-10-CM | POA: Diagnosis not present

## 2015-08-14 DIAGNOSIS — Z8639 Personal history of other endocrine, nutritional and metabolic disease: Secondary | ICD-10-CM | POA: Diagnosis not present

## 2015-08-14 DIAGNOSIS — R51 Headache: Secondary | ICD-10-CM | POA: Diagnosis present

## 2015-08-14 DIAGNOSIS — R21 Rash and other nonspecific skin eruption: Secondary | ICD-10-CM | POA: Diagnosis not present

## 2015-08-14 DIAGNOSIS — F1910 Other psychoactive substance abuse, uncomplicated: Secondary | ICD-10-CM

## 2015-08-14 DIAGNOSIS — Z880 Allergy status to penicillin: Secondary | ICD-10-CM | POA: Diagnosis not present

## 2015-08-14 DIAGNOSIS — Z8701 Personal history of pneumonia (recurrent): Secondary | ICD-10-CM | POA: Insufficient documentation

## 2015-08-14 DIAGNOSIS — R2241 Localized swelling, mass and lump, right lower limb: Secondary | ICD-10-CM | POA: Diagnosis not present

## 2015-08-14 DIAGNOSIS — F319 Bipolar disorder, unspecified: Secondary | ICD-10-CM | POA: Diagnosis not present

## 2015-08-14 DIAGNOSIS — F419 Anxiety disorder, unspecified: Secondary | ICD-10-CM | POA: Insufficient documentation

## 2015-08-14 DIAGNOSIS — F603 Borderline personality disorder: Secondary | ICD-10-CM | POA: Diagnosis not present

## 2015-08-14 DIAGNOSIS — M1990 Unspecified osteoarthritis, unspecified site: Secondary | ICD-10-CM | POA: Insufficient documentation

## 2015-08-14 DIAGNOSIS — Z7951 Long term (current) use of inhaled steroids: Secondary | ICD-10-CM | POA: Diagnosis not present

## 2015-08-14 DIAGNOSIS — Z87891 Personal history of nicotine dependence: Secondary | ICD-10-CM | POA: Diagnosis not present

## 2015-08-14 DIAGNOSIS — Z8719 Personal history of other diseases of the digestive system: Secondary | ICD-10-CM | POA: Diagnosis not present

## 2015-08-14 DIAGNOSIS — Z86010 Personal history of colonic polyps: Secondary | ICD-10-CM | POA: Diagnosis not present

## 2015-08-14 LAB — CBC WITH DIFFERENTIAL/PLATELET
BASOS ABS: 0 10*3/uL (ref 0.0–0.1)
Basophils Relative: 0 %
EOS ABS: 0.3 10*3/uL (ref 0.0–0.7)
EOS PCT: 2 %
HCT: 42.4 % (ref 36.0–46.0)
Hemoglobin: 13.8 g/dL (ref 12.0–15.0)
LYMPHS PCT: 47 %
Lymphs Abs: 5 10*3/uL — ABNORMAL HIGH (ref 0.7–4.0)
MCH: 28.9 pg (ref 26.0–34.0)
MCHC: 32.5 g/dL (ref 30.0–36.0)
MCV: 88.9 fL (ref 78.0–100.0)
Monocytes Absolute: 0.6 10*3/uL (ref 0.1–1.0)
Monocytes Relative: 5 %
Neutro Abs: 4.9 10*3/uL (ref 1.7–7.7)
Neutrophils Relative %: 46 %
PLATELETS: 251 10*3/uL (ref 150–400)
RBC: 4.77 MIL/uL (ref 3.87–5.11)
RDW: 14.3 % (ref 11.5–15.5)
WBC: 10.7 10*3/uL — AB (ref 4.0–10.5)

## 2015-08-14 LAB — COMPREHENSIVE METABOLIC PANEL
ALT: 15 U/L (ref 14–54)
AST: 27 U/L (ref 15–41)
Albumin: 4.5 g/dL (ref 3.5–5.0)
Alkaline Phosphatase: 47 U/L (ref 38–126)
Anion gap: 8 (ref 5–15)
BUN: 10 mg/dL (ref 6–20)
CHLORIDE: 109 mmol/L (ref 101–111)
CO2: 26 mmol/L (ref 22–32)
CREATININE: 0.67 mg/dL (ref 0.44–1.00)
Calcium: 9.8 mg/dL (ref 8.9–10.3)
GFR calc Af Amer: >60 mL/min (ref >60)
GFR calc non Af Amer: >60 mL/min (ref >60)
Glucose, Bld: 90 mg/dL (ref 65–99)
Potassium: 4.4 mmol/L (ref 3.5–5.1)
SODIUM: 143 mmol/L (ref 135–145)
Total Bilirubin: 0.8 mg/dL (ref 0.3–1.2)
Total Protein: 7.1 g/dL (ref 6.5–8.1)

## 2015-08-14 LAB — RAPID URINE DRUG SCREEN, HOSP PERFORMED
AMPHETAMINES: NOT DETECTED
Barbiturates: NOT DETECTED
Benzodiazepines: NOT DETECTED
Cocaine: NOT DETECTED
OPIATES: NOT DETECTED
TETRAHYDROCANNABINOL: NOT DETECTED

## 2015-08-14 LAB — ETHANOL: Alcohol, Ethyl (B): <5 mg/dL (ref <5)

## 2015-08-14 LAB — SALICYLATE LEVEL

## 2015-08-14 LAB — ACETAMINOPHEN LEVEL

## 2015-08-14 MED ORDER — LORAZEPAM 1 MG PO TABS
0.0000 mg | ORAL_TABLET | Freq: Four times a day (QID) | ORAL | Status: DC
  Administered 2015-08-14: 2 mg via ORAL
  Administered 2015-08-15: 1 mg via ORAL
  Filled 2015-08-14: qty 2
  Filled 2015-08-14: qty 1

## 2015-08-14 MED ORDER — THIAMINE HCL 100 MG/ML IJ SOLN: 100.0000 mg | Freq: Every day | INTRAMUSCULAR | Status: DC

## 2015-08-14 MED ORDER — LORAZEPAM 1 MG PO TABS: 0.0000 mg | ORAL_TABLET | Freq: Two times a day (BID) | ORAL | Status: DC

## 2015-08-14 MED ORDER — OXYCODONE-ACETAMINOPHEN 5-325 MG PO TABS
2.0000 | ORAL_TABLET | Freq: Four times a day (QID) | ORAL | Status: DC | PRN
  Filled 2015-08-14: qty 2

## 2015-08-14 MED ORDER — DIPHENHYDRAMINE HCL 25 MG PO CAPS
25.0000 mg | ORAL_CAPSULE | Freq: Once | ORAL | Status: AC
  Administered 2015-08-14: 25 mg via ORAL
  Filled 2015-08-14: qty 1

## 2015-08-14 MED ORDER — DIPHENHYDRAMINE HCL 50 MG/ML IJ SOLN
25.0000 mg | Freq: Once | INTRAMUSCULAR | Status: DC
  Filled 2015-08-14: qty 1

## 2015-08-14 MED ORDER — VITAMIN B-1 100 MG PO TABS
100.0000 mg | ORAL_TABLET | Freq: Every day | ORAL | Status: DC
  Administered 2015-08-14 – 2015-08-15 (×2): 100 mg via ORAL
  Filled 2015-08-14 (×2): qty 1

## 2015-08-14 NOTE — Telephone Encounter (Signed)
Mishael called to say (message)  that she was concerned because she took more ativan than she had previously disclosed and is now out. Clinician called her back advised her to go to ER. also suggested she could call her prescribing doctor and inform them of her abuse and abrupt stop. Informed her that it was very dangerous and she needed to be evaluated.    Venisa message  "Hey Frankie Aubri Farro. It is 11:23. And I  Uh, I am sorry about yesterday. Things are kinda starting to hit me now, and I am kinda realizing what I did. Um and I don't know um like whats is going to happen now because honestly there was 60 Ativan in there. And I took them all in last couple days and I have like no more left, so I have nothing. So hmm  just wondering if I should go to the hospital or not, or can I do this umm a different way. Um  Because I really really  Rather not to go to the  Hospital, not the mental hospital anyway, a real hospital I could do. But Actually I was there yesterday I was in the ER because I sprained my ankle from fall the night before. So I just need your advice because I am kinda just thinking through these things, I am not sure whats going to happen, I have never really done this before,  that much. So I just would love your advice. Thanks [phone] "  Clinician returned call advised Coraleigh to go to the ER and let them know what she has taken, She shared that she was not wanting to go. Clinician suggest she call her prescribing Doctor as they may be able to help her taper if she was forth coming with them. After repeating advice to go to the ER or call her doctor. Clinician informed Breya withdrawal could be very dangerous, possibly life threatening depending on how much of what was consumed and when. Clinician adviser her once again to go to ER or contact her prescribing doctor. Clinician asked Priti what she planned to do and she said that she was going to wait for her friend and ask  for her advice. Clinician encouraged her to have her friend drive her to where she could receive help.

## 2015-08-14 NOTE — Telephone Encounter (Addendum)
Called to say that she was concerned because she took more ativan than she had previously disclosed and is now out. Clinician called her back advised her to go to ER. also suggested she could call her prescribing doctor and inform them of her abuse and abrupt stop. Informed her that it was very dangerous and she needed to be evaluated.    Jayma message  "Hey Morgan [name]. It is 11:23. And I  Uh, I am sorry about yesterday. Things are kinda starting to hit me now, and I am kinda realizing what I did. Um and I don't know um like whats is going to happen now because honestly there was 60 Ativan in there. And I took them all in last couple days and I have like no more left, so I have nothing. So hmm  just wondering if I should go to the hospital or not, or can I do this umm a different way. Um  Because I really really  Rather not to go to the  Hospital, not the mental hospital anyway, a real hospital I could do. But Actually I was there yesterday I was in the ER because I sprained my ankle from fall the night before. So I just need your advice because I am kinda just thinking through these things, I am not sure whats going to happen, I have never really done this before,  that much. So I just would love your advice. Thanks [phone] "  Clinician returned call advised Morgan [name] to go to the ER and let them know what she has taken, She shared that she was not wanting to go. Clinician suggest she call her prescribing Doctor as they may be able to help her taper if she was forth coming with them. After repeating advice to go to the ER or call her doctor. Clinician informed Morgan [name] withdrawal could be very dangerous, possibly life threatening depending on how much of what was consumed and when. Clinician adviser her once again to go to ER or contact her prescribing doctor. Clinician asked Morgan [name] what she planned to do and she said that she was going to wait for her friend and ask for her advice.  Clinician encouraged her to have her friend drive her to where she could receive help.

## 2015-08-14 NOTE — Telephone Encounter (Signed)
Morgan [name] left message 1:47 stating that her friends were going to take her to the ER and thanked clinician for helping her

## 2015-08-14 NOTE — [facility] Counselor (Signed)
Psych disposition: Per Patriciaann Clan, PA-C, Pt meets inpt criteria. Per Jerene Pitch, [facility], no appropriate [facility] beds. TTS to seek placement.    Pt was adamant that she does not want to go to inpt tx if it is recommended. IVC may need to be initiated.    Ramond Dial, [facility]   Therapeutic Triage    Ext [phone]

## 2015-08-14 NOTE — ED Notes (Signed)
Patient sent over by therapist, reports taking 16 Ativan this past weekend, over 3-4 days in an attempt to help with panic attacks and to help sleep. Patient asked several times if she is SI and denies each time. Denies HI, AVH. Last ETOH yesterday. C/o HA and nausea.

## 2015-08-14 NOTE — ED Provider Notes (Signed)
CSN: [id number]     Arrival date & time 08/14/15  1417 History   First MD Initiated Contact with Patient 08/14/15 1638     Chief Complaint  Patient presents with  . Medication Reaction     (Consider location/radiation/quality/duration/timing/severity/associated sxs/prior Treatment) HPI Comments: Patient presents emergency department with chief complaint of drug addiction. She states that she is withdrawing from benzodiazepines as well as alcohol. She states that she recently took 60 Ativan last weekend. She states that she has frequent panic attacks, and felt that she needed to take this many to subdue her anxiety. She states that she does not have SI or HI. She states she is only here because her psychologist told her that withdrawing from benzodiazepines is dangerous. She complains of headache and nausea. She also reports drinking large amounts of alcohol. Her last drink was yesterday. She reports of right ankle pain, and was seen previously for this. She is wearing a Cam Walker for this. She also complains of an itchy rash on her chest and back. She denies any fevers or chills.  The history is provided by the patient. No language interpreter was used.    Past Medical History  Diagnosis Date  . Asthma   . IBS (irritable bowel syndrome) 1995  . Arthritis   . Degenerative disorder of bone   . Bulging lumbar disc 07/19/05  . Bipolar disorder ([place])   . History of borderline personality disorder   . Anxiety   . Hypothyroidism 2007    Developed after use of Lithium  . Chronic headaches   . Colon polyps   . Proctitis   . Depression   . Pneumonia    Past Surgical History  Procedure Laterality Date  . Cholecystectomy    . Tubal ligation    . Abdominal hysterectomy    . Shoulder open rotator cuff repair Right 03/2010  . Bunionectomy Right 06/2012  . Appendectomy     Family History  Problem Relation Age of Onset  . Depression Mother   . Hypertension Mother   . Post-traumatic  stress disorder Sister   . Alcohol abuse Brother   . Drug abuse Brother   . Post-traumatic stress disorder Brother   . Thyroid disease Father   . Alzheimer's disease Father   . COPD Maternal Grandmother   . Heart disease Maternal Grandfather   . Arthritis Paternal Grandmother   . Diabetes Paternal Grandmother   . Depression Paternal Grandmother   . Alzheimer's disease Paternal Grandmother   . Heart disease Paternal Grandfather   . Coronary artery disease Paternal Grandfather   . Alcohol abuse Paternal Grandfather    Social History  Substance Use Topics  . Smoking status: Former Smoker    Quit date: 07/20/1980  . Smokeless tobacco: Never Used  . Alcohol Use: 0.0 oz/week    0 Standard drinks or equivalent per week     Comment: occ   OB History    Gravida Para Term Preterm AB TAB SAB Ectopic Multiple Living   2 2 2  0 0 0 0 0 0 2     Review of Systems  Constitutional: Negative for fever and chills.  Respiratory: Negative for shortness of breath.   Cardiovascular: Negative for chest pain.  Gastrointestinal: Negative for nausea, vomiting, diarrhea and constipation.  Genitourinary: Negative for dysuria.  Musculoskeletal: Positive for myalgias, joint swelling and arthralgias.  Skin: Positive for rash.  Psychiatric/Behavioral: Positive for agitation. The patient is nervous/anxious.   All other systems reviewed and  are negative.     Allergies  Abilify; Ciprofloxacin; Lamictal; Amoxicillin; Doxycycline; Latex; Meloxicam; Mirapex; Prednisone; Risperdal; Tape; Cortisone; Naproxen; Percocet; and Sulfa antibiotics  Home Medications   Prior to Admission medications   Medication Sig Start Date End Date Taking? Authorizing Provider  albuterol (PROVENTIL HFA;VENTOLIN HFA) 108 (90 Base) MCG/ACT inhaler Inhale into the lungs every 6 (six) hours as needed for wheezing or shortness of breath.   Yes Historical Provider, MD  Aspirin-Acetaminophen-Caffeine (EXCEDRIN EXTRA STRENGTH PO) Take  by mouth daily as needed (headache.).   Yes Historical Provider, MD  Fluticasone Furoate-Vilanterol (BREO ELLIPTA) 100-25 MCG/INH AEPB Inhale into the lungs as needed.   Yes Historical Provider, MD  hyoscyamine (LEVSIN, ANASPAZ) 0.125 MG tablet Take 0.125 mg by mouth as needed for cramping.    Yes Historical Provider, MD  LORazepam (ATIVAN) 0.5 MG tablet 1  qday  prn Patient taking differently: Take 0.5 mg by mouth daily as needed for anxiety.  02/20/15  Yes Norma Fredrickson, MD  OVER THE COUNTER MEDICATION Reflux relief , take one as needed   Yes Historical Provider, MD  ibuprofen (ADVIL,MOTRIN) 600 MG tablet Take 1 tablet (600 mg total) by mouth every 6 (six) hours as needed. 08/13/15   Comer Locket, PA-C  lithium carbonate (ESKALITH) 450 MG CR tablet Take 450 mg by mouth 2 (two) times daily. 08/07/15   Historical Provider, MD   BP 98/83 mmHg  Pulse 82  Temp(Src) 98.7 F (37.1 C) (Oral)  Resp 17  SpO2 97% Physical Exam  Constitutional: She is oriented to person, place, and time. She appears well-developed and well-nourished.  HENT:  Head: Normocephalic and atraumatic.  Eyes: Conjunctivae and EOM are normal. Pupils are equal, round, and reactive to light.  Neck: Normal range of motion. Neck supple.  Cardiovascular: Normal rate and regular rhythm.  Exam reveals no gallop and no friction rub.   No murmur heard. Pulmonary/Chest: Effort normal and breath sounds normal. No respiratory distress. She has no wheezes. She has no rales. She exhibits no tenderness.  Clear to auscultation bilaterally  Abdominal: Soft. Bowel sounds are normal. She exhibits no distension and no mass. There is no tenderness. There is no rebound and no guarding.  Musculoskeletal: Normal range of motion. She exhibits no edema or tenderness.  Right ankle is moderately swollen Range of motion and strength limited secondary to pain Patient using Cam Walker  Neurological: She is alert and oriented to person, place, and time.   Skin: Skin is warm and dry.  Mild maculopapular rash on chest and back No evidence of cellulitis  Psychiatric: She has a normal mood and affect. Her behavior is normal. Judgment and thought content normal.  Nursing note and vitals reviewed.   ED Course  Procedures (including critical care time) Labs Review Labs Reviewed  CBC WITH DIFFERENTIAL/PLATELET - Abnormal; Notable for the following:    WBC 10.7 (*)    Lymphs Abs 5.0 (*)    All other components within normal limits  ACETAMINOPHEN LEVEL - Abnormal; Notable for the following:    Acetaminophen (Tylenol), Serum <10 (*)    All other components within normal limits  COMPREHENSIVE METABOLIC PANEL  ETHANOL  URINE RAPID DRUG SCREEN, HOSP PERFORMED  SALICYLATE LEVEL    Imaging Review Dg Foot Complete Right  08/13/2015  CLINICAL DATA:  Patient status post fall with bruising and pain along the lateral aspect of the foot. EXAM: RIGHT FOOT COMPLETE - 3+ VIEW COMPARISON:  None. FINDINGS: Postsurgical changes involving the distal  first metatarsal. Normal anatomic alignment. No evidence for acute fracture or dislocation. Midfoot degenerative changes. No significant soft tissue abnormality. IMPRESSION: No acute osseous abnormality Electronically Signed   By: Lovey Newcomer M.D.   On: 08/13/2015 16:44   I have personally reviewed and evaluated these images and lab results as part of my medical decision-making.    MDM   Final diagnoses:  Substance abuse    Patient requesting detox from benzodiazepines and alcohol.  Patient meets inpatient criteria. Awaiting bed placement.  Labs are largely unremarkable. Medically clear. However, patient will need to continue using the Cam Walker for her ankle sprain.    Montine Circle, PA-C 08/14/15 2119  Nat Christen, MD 08/15/15 [phone]
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
## 2015-08-15 ENCOUNTER — Inpatient Hospital Stay (HOSPITAL_COMMUNITY)
Admission: AD | Admit: 2015-08-15 | Discharge: 2015-08-20 | DRG: 883 | Disposition: A | Discharge status: Home / Self Care | Payer: Medicare Other | Source: Intra-hospital | Attending: Psychiatry | Admitting: Psychiatry

## 2015-08-15 ENCOUNTER — Encounter (HOSPITAL_COMMUNITY): Payer: Self-pay | Admitting: Psychiatry

## 2015-08-15 DIAGNOSIS — F603 Borderline personality disorder: Secondary | ICD-10-CM | POA: Diagnosis not present

## 2015-08-15 DIAGNOSIS — F1310 Sedative, hypnotic or anxiolytic abuse, uncomplicated: Secondary | ICD-10-CM | POA: Diagnosis not present

## 2015-08-15 DIAGNOSIS — F1390 Sedative, hypnotic, or anxiolytic use, unspecified, uncomplicated: Secondary | ICD-10-CM

## 2015-08-15 DIAGNOSIS — Z8659 Personal history of other mental and behavioral disorders: Secondary | ICD-10-CM

## 2015-08-15 DIAGNOSIS — R45851 Suicidal ideations: Secondary | ICD-10-CM | POA: Diagnosis present

## 2015-08-15 DIAGNOSIS — F1020 Alcohol dependence, uncomplicated: Secondary | ICD-10-CM | POA: Diagnosis present

## 2015-08-15 DIAGNOSIS — F3112 Bipolar disorder, current episode manic without psychotic features, moderate: Secondary | ICD-10-CM | POA: Diagnosis present

## 2015-08-15 DIAGNOSIS — F4310 Post-traumatic stress disorder, unspecified: Secondary | ICD-10-CM | POA: Diagnosis present

## 2015-08-15 DIAGNOSIS — F1320 Sedative, hypnotic or anxiolytic dependence, uncomplicated: Secondary | ICD-10-CM | POA: Clinically undetermined

## 2015-08-15 MED ORDER — LURASIDONE HCL 40 MG PO TABS
40.0000 mg | ORAL_TABLET | Freq: Every day | ORAL | Status: DC
  Filled 2015-08-15: qty 1

## 2015-08-15 MED ORDER — THIAMINE HCL 100 MG/ML IJ SOLN: 100.0000 mg | Freq: Every day | INTRAMUSCULAR | Status: DC

## 2015-08-15 MED ORDER — ADULT MULTIVITAMIN W/MINERALS CH
1.0000 | ORAL_TABLET | Freq: Every day | ORAL | Status: DC
  Administered 2015-08-15 – 2015-08-20 (×5): 1 via ORAL
  Filled 2015-08-15 (×10): qty 1

## 2015-08-15 MED ORDER — LORAZEPAM 1 MG PO TABS: 0.0000 mg | ORAL_TABLET | Freq: Two times a day (BID) | ORAL | Status: DC

## 2015-08-15 MED ORDER — ONDANSETRON 4 MG PO TBDP: 4.0000 mg | ORAL_TABLET | Freq: Four times a day (QID) | ORAL | Status: AC | PRN

## 2015-08-15 MED ORDER — LITHIUM CARBONATE ER 450 MG PO TBCR
450.0000 mg | EXTENDED_RELEASE_TABLET | Freq: Two times a day (BID) | ORAL | Status: DC
  Administered 2015-08-15: 450 mg via ORAL
  Filled 2015-08-15 (×2): qty 1

## 2015-08-15 MED ORDER — LORAZEPAM 1 MG PO TABS: 0.0000 mg | ORAL_TABLET | Freq: Four times a day (QID) | ORAL | Status: DC

## 2015-08-15 MED ORDER — ALUM & MAG HYDROXIDE-SIMETH 200-200-20 MG/5ML PO SUSP: 30.0000 mL | ORAL | Status: DC | PRN

## 2015-08-15 MED ORDER — LORAZEPAM 1 MG PO TABS
1.0000 mg | ORAL_TABLET | Freq: Two times a day (BID) | ORAL | Status: AC
  Administered 2015-08-18: 1 mg via ORAL
  Filled 2015-08-15: qty 1

## 2015-08-15 MED ORDER — LITHIUM CARBONATE ER 450 MG PO TBCR
450.0000 mg | EXTENDED_RELEASE_TABLET | Freq: Two times a day (BID) | ORAL | Status: DC
  Filled 2015-08-15 (×2): qty 1

## 2015-08-15 MED ORDER — LORAZEPAM 1 MG PO TABS
1.0000 mg | ORAL_TABLET | Freq: Three times a day (TID) | ORAL | Status: AC
  Administered 2015-08-17 (×3): 1 mg via ORAL
  Filled 2015-08-15 (×5): qty 1

## 2015-08-15 MED ORDER — IBUPROFEN 400 MG PO TABS
400.0000 mg | ORAL_TABLET | ORAL | Status: DC | PRN
  Administered 2015-08-15 – 2015-08-18 (×6): 400 mg via ORAL
  Filled 2015-08-15 (×6): qty 1

## 2015-08-15 MED ORDER — LORAZEPAM 1 MG PO TABS: 1.0000 mg | ORAL_TABLET | Freq: Four times a day (QID) | ORAL | Status: AC | PRN

## 2015-08-15 MED ORDER — ACETAMINOPHEN 325 MG PO TABS: 650.0000 mg | ORAL_TABLET | Freq: Four times a day (QID) | ORAL | Status: DC | PRN

## 2015-08-15 MED ORDER — LOPERAMIDE HCL 2 MG PO CAPS: 2.0000 mg | ORAL_CAPSULE | ORAL | Status: AC | PRN

## 2015-08-15 MED ORDER — LORAZEPAM 1 MG PO TABS
1.0000 mg | ORAL_TABLET | Freq: Four times a day (QID) | ORAL | Status: AC
  Administered 2015-08-15 – 2015-08-16 (×6): 1 mg via ORAL
  Filled 2015-08-15 (×5): qty 1

## 2015-08-15 MED ORDER — LITHIUM CARBONATE ER 300 MG PO TBCR
300.0000 mg | EXTENDED_RELEASE_TABLET | Freq: Two times a day (BID) | ORAL | Status: DC
  Administered 2015-08-15 – 2015-08-16 (×2): 300 mg via ORAL
  Filled 2015-08-15 (×7): qty 1

## 2015-08-15 MED ORDER — MAGNESIUM HYDROXIDE 400 MG/5ML PO SUSP: 30.0000 mL | Freq: Every day | ORAL | Status: DC | PRN

## 2015-08-15 MED ORDER — LORAZEPAM 1 MG PO TABS
1.0000 mg | ORAL_TABLET | Freq: Every day | ORAL | Status: AC
  Administered 2015-08-19: 1 mg via ORAL
  Filled 2015-08-15: qty 1

## 2015-08-15 MED ORDER — VITAMIN B-1 100 MG PO TABS
100.0000 mg | ORAL_TABLET | Freq: Every day | ORAL | Status: DC
  Administered 2015-08-16 – 2015-08-20 (×4): 100 mg via ORAL
  Filled 2015-08-15 (×8): qty 1

## 2015-08-15 MED ORDER — HYDROXYZINE HCL 25 MG PO TABS
25.0000 mg | ORAL_TABLET | Freq: Four times a day (QID) | ORAL | Status: AC | PRN
  Administered 2015-08-16 – 2015-08-18 (×4): 25 mg via ORAL
  Filled 2015-08-15 (×4): qty 1

## 2015-08-15 NOTE — ED Notes (Signed)
Up on the phone 

## 2015-08-15 NOTE — ED Notes (Addendum)
Pt presents with anxiety and overdose on Ativan, given Rx for Ativan 16 tabs and took all tabs over a few days.  AAO x 3, no distress noted, cooperative but tearful and anxious.  Monitoring for safety, Q 15 min checks in effect.  CAM Walker boot in place to rt foot, gait steady.

## 2015-08-15 NOTE — [facility] Suicide Risk Assessment (Signed)
[facility] Admission Suicide Risk Assessment   Nursing information obtained from:    Demographic factors:    Current Mental Status:    Loss Factors:    Historical Factors:    Risk Reduction Factors:     Total Time spent with patient: 30 minutes Principal Problem: Borderline personality disorder Diagnosis:   Patient Active Problem List   Diagnosis Date Noted  . Borderline personality disorder [F60.3] 08/15/2015  . Severe benzodiazepine use disorder [F13.90] 08/15/2015  . Alcohol use disorder, moderate, dependence ([place]) [F10.20] 08/15/2015  . PTSD (post-traumatic stress disorder) [F43.10] 08/15/2015  . History of bipolar disorder [Z86.59] 08/15/2015  . Bronchopneumonia [J18.0] 07/10/2015  . Cough [R05] 06/19/2015  . Pre-syncope [R55] 05/17/2015  . Skull deformity [M95.2] 05/17/2015  . Headache [R51] 05/17/2015  . Patellofemoral syndrome of both knees [M22.2X1, M22.2X2] 05/02/2015  . Knee pain, bilateral [M25.561, M25.562] 03/05/2015   Subjective Data: Please see H&P.   Continued Clinical Symptoms:    The "Alcohol Use Disorders Identification Test", Guidelines for Use in Primary Care, Second Edition.  World Pharmacologist [facility]). Score between 0-7:  no or low risk or alcohol related problems. Score between 8-15:  moderate risk of alcohol related problems. Score between 16-19:  high risk of alcohol related problems. Score 20 or above:  warrants further diagnostic evaluation for alcohol dependence and treatment.   CLINICAL FACTORS:   Severe Anxiety and/or Agitation Panic Attacks Alcohol/Substance Abuse/Dependencies Personality Disorders:   Cluster B Comorbid alcohol abuse/dependence Unstable or Poor Therapeutic Relationship Previous Psychiatric Diagnoses and Treatments   Musculoskeletal: Strength & Muscle Tone: within normal limits Gait & Station: normal Patient leans: N/A  Psychiatric Specialty Exam: Review of Systems  Psychiatric/Behavioral: Positive for suicidal  ideas (self injurious behavior) and substance abuse. The patient is nervous/anxious and has insomnia.   All other systems reviewed and are negative.   Blood pressure 121/65, pulse 100, temperature 98.3 F (36.8 C), temperature source Oral, resp. rate 18, height 5' 3.5" (1.613 m), weight 73.483 kg (162 lb), SpO2 98 %.Body mass index is 28.24 kg/(m^2).  Please see H&P.                                                       COGNITIVE FEATURES THAT CONTRIBUTE TO RISK:  Closed-mindedness, Polarized thinking and Thought constriction (tunnel vision)    SUICIDE RISK:   Severe:  Frequent, intense, and enduring suicidal ideation, specific plan, no subjective intent, but some objective markers of intent (i.e., choice of lethal method), the method is accessible, some limited preparatory behavior, evidence of impaired self-control, severe dysphoria/symptomatology, multiple risk factors present, and few if any protective factors, particularly a lack of social support.  PLAN OF CARE: Please see H&P.   I certify that inpatient services furnished can reasonably be expected to improve the patient's condition.   Michaeleen Down, MD 08/15/2015, 4:32 PM

## 2015-08-15 NOTE — Progress Notes (Signed)
Patient ID: Morgan [name], female   DOB: 23-Apr-1966, 50 y.o.   MRN: [id number] PER STATE REGULATIONS 482.30  THIS CHART WAS REVIEWED FOR MEDICAL NECESSITY WITH RESPECT TO THE PATIENT'S ADMISSION/DURATION OF STAY.  NEXT REVIEW DATE:08/19/15  Roma Schanz, RN, BSN CASE MANAGER

## 2015-08-15 NOTE — Progress Notes (Signed)
This nurse went to pt locker and got pt walking boot per MD order. Pt encouraged to use walking boot at this time.
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
## 2015-08-15 NOTE — Tx Team (Signed)
Initial Interdisciplinary Treatment Plan   PATIENT STRESSORS: Medication change or noncompliance Substance abuse   PATIENT STRENGTHS: Ability for insight Average or above average intelligence Communication skills General fund of knowledge   PROBLEM LIST: Problem List/Patient Goals Date to be addressed Date deferred Reason deferred Estimated date of resolution  "I don't want to be here, would rather have a plan and do this from home." 08/15/2015     "I'm sick of all this, it never needed to get this far." 08/15/2015     "to get stable" 08/15/2015     "I shut down in hospitals, I'm afraid" 08/15/2015     anxiety 08/15/2015     Substance abuse 08/15/2015                        DISCHARGE CRITERIA:  Ability to meet basic life and health needs Improved stabilization in mood, thinking, and/or behavior  PRELIMINARY DISCHARGE PLAN: Return to previous living arrangement  PATIENT/FAMIILY INVOLVEMENT: This treatment plan has been presented to and reviewed with the patient, Fynnley Feider,  Forrestine Him 08/15/2015, 5:14 PM

## 2015-08-15 NOTE — ED Notes (Signed)
Up to the bathroom 

## 2015-08-15 NOTE — ED Notes (Signed)
Morgan [name] contacted for transport 

## 2015-08-15 NOTE — ED Notes (Addendum)
Pt ambulatory w/o difficulty to [facility] w/ [place], belongings given to driver. 

## 2015-08-15 NOTE — Progress Notes (Addendum)
Admission note:  Pt admitted to 500 hallway per MD order. Pt reports she is here d/t "stupidity" "I overdosed on Lorazepam and been drinking" Pt reports anxiety as withdrawal symptom. Pt presents with increased anxiety and irritability. Pt reports previous suicide attempts but reports this event was not a suicide attempt. Pt denies SI/HI at this time. Pt denies auditory hallucinations, reports visual hallucinations. Pt reports seeing things out the corner of her eye that are moving. Pt reports this use to happen all the time, but has not happened in a couple years. Pt reports she fell Monday night and went to the ED. Reports a right foot fx. Bruising and swelling noted at right foot. Pt came to facility in walking boot. Reports she does not want to deal with it on the unit and was told she only had to wear it a couple days. Walking boot locked with personal belongings. Pt reports she is seen outpatient and has been off her medication. Reports the medication "were not working" and had been off them since November. Pt currently lives with daughter and with 7 other 2 year olds and reports conflict. Pt reports drinking alcohol to help her sleep. "When I drink I am brave and that's when I overdose." Pt reports she is here "to get stable" "I don't want to be here, would rather have a plan and do this from home." Pt easily angered during admission process. "I shut down in hospitals, I'm afraid" Denies previous admission to [facility], but reports admissions to other psych facilities. Skin assessment completed. Old scars noted on forearms, pt reports she use to cut. All admission paperwork signed. Personal belongings locked in locker # 70. Pt oriented to unit. Special checks q 15 mins initated for safety. Will continue to monitor.

## 2015-08-15 NOTE — ED Notes (Signed)
CSW into see 

## 2015-08-15 NOTE — BH Assessment (Signed)
[name]  Per [name], MD, this pt requires psychiatric hospitalization at this time.  Morgan Percy, RN, [facility] has assigned pt to [facility] Rm 505-2.  Pt has signed Voluntary Admission and Consent for Treatment, as well as Consent to Release Information to [name], her outpatient provider, as well as her children and a friend.  A notification call has been placed to Morgan [name].  Signed forms have been faxed to [facility].  Pt's nurse, [name], has been notified, and agrees to send original paperwork along with pt via Morgan [name], and to call report to [phone].  Jalene Mullet, [place] Triage Specialist [phone]

## 2015-08-15 NOTE — ED Notes (Signed)
Nad, resting quietly, feet/ankles elevated, watching tv.

## 2015-08-15 NOTE — ED Notes (Signed)
Morgan [name] is here to transport, pt on the phone to notify daughter

## 2015-08-16 LAB — TSH: TSH: 9.305 u[IU]/mL — AB (ref 0.350–4.500)

## 2015-08-16 MED ORDER — HYOSCYAMINE SULFATE 0.125 MG PO TABS
0.1250 mg | ORAL_TABLET | Freq: Four times a day (QID) | ORAL | Status: DC | PRN
  Filled 2015-08-16 (×2): qty 1
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
## 2015-08-16 NOTE — BHH Suicide Risk Assessment (Signed)
[facility] INPATIENT:  Family/Significant Other Suicide Prevention Education  Suicide Prevention Education:  Patient Refusal for Family/Significant Other Suicide Prevention Education: The patient Morgan [name] has refused to provide written consent for family/significant other to be provided Family/Significant Other Suicide Prevention Education during admission and/or prior to discharge.  Physician notified.  Georga Kaufmann 08/16/2015, 4:24 PM

## 2015-08-16 NOTE — Progress Notes (Signed)
D: Pt A & O X3. Denies SI, HI, AVH when assessed. Pt isolative to room for majority of this shift. Out of room for activities (meals and meds). Pt was tearful earlier related to medication changes but brightened up as the day progressed. Compliant with medications when offered.  A: Q 15 minutes checks maintained for safety without incident thus far this shift. All medications administered as ordered including PRN Motrin for c/o headache (see EMAR). Emotional support, availability and encouragement provided to this pt.  R: Pt in agreement with changes in current POC / regimen when informed by writer (labs, medications). Pt receptive to care. Safety maintained on and off unit.

## 2015-08-16 NOTE — Tx Team (Signed)
Interdisciplinary Treatment Plan Update (Adult)  Date:  08/16/2015 Time Reviewed:  4:07 PM  Progress in Treatment: Attending groups: Yes. Participating in groups: Yes. Taking medication as prescribed:  Yes. Tolerating medication:  Yes. Family/Significant othe contact made: No, pt refused. Patient understands diagnosis:  Yes, as evidenced by seeking help with medication. Discussing patient identified problems/goals with staff:  Yes, see initial care plan. Medical problems stabilized or resolved:  Yes Denies suicidal/homicidal ideation: Yes. Issues/concerns per patient self-inventory: No. Other:  New problem(s) identified:   Discharge Plan or Barriers:  Reason for Continuation of Hospitalization: Depression Hallucinations Medication stabilization Suicidal ideation  Comments: Pt stated that she overdosed on Ativan. Pt reported SI over the past few months but denies that this was a suicide attempt. She says she was trying to control her anxiety and panic attacks at the time. She says that she went to a new psychiatrist at [place] recently "because my doctor wasn't doing crap for me". They prescribed the pt #60 Ativan 0.5 mg and she took these in just a matter of days "because they weren't helping and I thought maybe they were placebos". Pt told her therapist [name], LCSW) about this, and the therapist urged the pt to go to the ED for evaluation because she was concerned about potential benzodiazepine withdrawal. Pt endorses depressive sx including fatigue, trouble sleeping, decreased appetite, lack of motivation, social isolation, and increased irritability and anger. She also reports an increased number of recent manic episodes, as well as severe anxiety in crowds. [facility] also prescribed Lithium to the pt, but she has yet to start taking it. Pt says she didn't start the Lithium because she has been drinking heavily. Ativan trial  Estimated length of stay: 2-4  days  New goal(s):  Review of initial/current patient goals per problem list:  1. Goal(s): Patient will participate in aftercare plan  Met:Yes  Target date: at discharge  As evidenced by: Patient will participate within aftercare plan AEB aftercare provider and housing plan at discharge being identified. 08/16/15: Pt will return home and follow-up outpt with [facility].   2. Goal (s): Patient will exhibit decreased depressive symptoms and suicidal ideations.  Met:No  Target date: at discharge  As evidenced by: Patient will utilize self rating of depression at 3 or below and demonstrate decreased signs of depression or be deemed stable for discharge by MD. 08/16/15: Pt denies SI but still endorses depressive symptoms.   4. Goal(s): Patient will demonstrate decreased signs of psychosis.  Met: No  Target date:at discharge  As evidenced by: Patient will demonstrate decreased signs of psychosis as evidenced by a reduction in AVH, paranoia, and/or delusions.   08/16/15: Pt denies AVH. However, pt seems to be responding to internal stimuli as evidenced by thought blocking and disorganized thinking.    Attendees: Patient:  08/16/2015 4:07 PM  Family:   08/16/2015 4:07 PM  Physician:  Dr. Ursula Alert, MD 08/16/2015 4:07 PM  Nursing: Festus Aloe, RN 08/16/2015 4:07 PM  Case Manager:  Roque Lias, LCSW 08/16/2015 4:07 PM  Counselor:  Matthew Saras, MSW Intern 08/16/2015 4:07 PM  Other:   08/16/2015 4:07 PM  Other:   08/16/2015 4:07 PM  Other:   08/16/2015 4:07 PM  Other:  08/16/2015 4:07 PM  Other:    Other:    Other:    Other:    Other:    Other:      Scribe for Treatment Team:   Georga Kaufmann, MSW Intern  08/16/2015 4:07 PM

## 2015-08-16 NOTE — Progress Notes (Signed)
Pt did not attend wrap-up group meeting.

## 2015-08-16 NOTE — BHH Group Notes (Signed)
[facility] LCSW Aftercare Discharge Planning Group Note   08/16/2015 9:17 AM  Participation Quality:  Invited.  Chose to not come    [place], [place] B

## 2015-08-16 NOTE — Progress Notes (Signed)
   D: Pt walked up the hall holding onto the rail. However, when she crossed the hall to the med window, pt's gait was appropriate. Pt informed the writer that she was asleep and dreaming, prior to call for med pass.  Pt has no questions or concerns.   A:  Support and encouragement was offered. 15 min checks continued for safety.  R: Pt remains safe.

## 2015-08-16 NOTE — BHH Group Notes (Signed)
[facility] LCSW Group Therapy   08/16/2015 1:28 PM  Type of Therapy: Group Therapy  Participation Level:  Invited. Chose not to attend. In bed asleep.  Summary of Progress/Problems: Chaplain was here to lead a group on themes of hope and/or courage.   Georga Kaufmann 08/16/2015 1:28 PM
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
## 2015-08-17 DIAGNOSIS — R45851 Suicidal ideations: Secondary | ICD-10-CM

## 2015-08-17 LAB — T4, FREE: FREE T4: 0.76 ng/dL (ref 0.61–1.12)

## 2015-08-17 NOTE — BHH Group Notes (Signed)
[facility] Group Notes:  (Clinical Social Work)  08/17/2015  11:15-12:00PM  Summary of Progress/Problems:   Today's process group involved patients discussing their feelings related to being hospitalized, as well as how they can use their present feelings to create a plan for discharge and how to stay well.  There was considerable discussion about the benefit of being with others with similar problems and no longer feeling alone.  There was also discussion about boundary setting with people who are judgmental. The patient expressed that it is hard to be in the hospital, and she fought it.  She stated she would never tell her family where she is, except her children, due to their judgment.  The patient had started a significant support group through her church in [place] and is considering doing the same here.  Type of Therapy:  Group Therapy - Process  Participation Level:  Active  Participation Quality:  Attentive, Sharing and Supportive  Affect:  Blunted  Cognitive:  Appropriate  Insight:  Engaged  Engagement in Therapy:  Engaged  Modes of Intervention:  Exploration, Discussion  Selmer Dominion, LCSW 08/17/2015, 1:29 PM
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
## 2015-08-17 NOTE — Progress Notes (Signed)
Writer spoke with patient 1:1 concerning her left leg pain and was medicated accordingly. She spoke of not resting well last night and how she paced a bit to try and help her to fall asleep. She was also concerned about one of her prn medications she takes for cramping. This medication was not ordered and Probation officer spoke with NP on call to get this restarted. She did not attend group tonight and had fallen asleep before taking her scheduled meds. Support given and safety maintained on unit with 15 min checks.

## 2015-08-17 NOTE — Progress Notes (Signed)
Patient ID: Morgan [name], female   DOB: Dec 21, 1965, 50 y.o.   MRN: [id number]   D: Pt has been very flat and depressed on the unit today, pt reported that the Ativan was making her very tired. Pt refused her evening dose of Ativan, then went to dinner came back early reported that it was to many people and that she was having lots of anxiety. Pt was given a dose of Ativan and Vistaril, with relief. Pt reported that her depression was a 0, her hopelessness was a 5, and her anxiety was a 0. Pt reported that his goal for today was to go to group and read a chapter in her book. Pt reported being negative SI/HI, no AH/VH noted. A: 15 min checks continued for patient safety. R: Pt safety maintained.

## 2015-08-17 NOTE — Plan of Care (Signed)
Problem: Alteration in mood & ability to function due to Goal: STG-Patient will attend groups Outcome: Not Progressing Patient did not attend group tonight and remained in her room until over.

## 2015-08-18 MED ORDER — IBUPROFEN 600 MG PO TABS
600.0000 mg | ORAL_TABLET | Freq: Four times a day (QID) | ORAL | Status: DC | PRN
  Administered 2015-08-18 – 2015-08-20 (×6): 600 mg via ORAL
  Filled 2015-08-18 (×5): qty 1

## 2015-08-18 MED ORDER — HYDROXYZINE HCL 25 MG PO TABS
25.0000 mg | ORAL_TABLET | Freq: Three times a day (TID) | ORAL | Status: DC | PRN
  Administered 2015-08-18 – 2015-08-19 (×3): 25 mg via ORAL
  Filled 2015-08-18 (×3): qty 1

## 2015-08-18 MED ORDER — METHOCARBAMOL 750 MG PO TABS
750.0000 mg | ORAL_TABLET | Freq: Three times a day (TID) | ORAL | Status: DC | PRN
  Administered 2015-08-18 – 2015-08-19 (×2): 750 mg via ORAL
  Filled 2015-08-18 (×2): qty 1

## 2015-08-18 NOTE — Progress Notes (Signed)
Patient has been in bed asleep since shift change. No distress noted. She did not attend wrap up group tonight. Safety maintained on unit with 15 min checks.

## 2015-08-18 NOTE — Progress Notes (Signed)
Patient ID: Morgan [name], female   DOB: Jan 15, 1966, 50 y.o.   MRN: [id number]   D: Pt has been very flat and depressed on the unit today, pt reported that the Ativan was making her very tired. Pt refused her morning dose of Ativan.  Pt reported that her depression was a 0, her hopelessness was a 2, and her anxiety was a 0. Pt reported that his goal for today was to decide on what medication to take. Pt reported being negative SI/HI, no AH/VH noted. A: 15 min checks continued for patient safety. R: Pt safety maintained.

## 2015-08-18 NOTE — [facility] Group Notes (Signed)
[facility] Group Notes: (Clinical Social Work)   08/18/2015      Type of Therapy:  Group Therapy   Participation Level:  Did Not Attend despite MHT prompting   Selmer Dominion, LCSW 08/18/2015, 3:38 PM
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
## 2015-08-18 NOTE — Progress Notes (Signed)
Adult Psychoeducational Group Note  Date:  08/18/2015 Time:  8:50 PM  Group Topic/Focus:  Wrap-Up Group:   The focus of this group is to help patients review their daily goal of treatment and discuss progress on daily workbooks.  Participation Level:  Active  Participation Quality:  Appropriate  Affect:  Appropriate  Cognitive:  Appropriate  Insight: Appropriate  Engagement in Group:  Engaged  Modes of Intervention:  Discussion  Additional Comments: The patient expressed that she rates his day a 7.The patient also said that her support system was God.  Nash Shearer 08/18/2015, 8:50 PM

## 2015-08-19 LAB — T3, FREE: T3, Free: 3 pg/mL (ref 2.0–4.4)

## 2015-08-19 LAB — LITHIUM LEVEL: Lithium Lvl: <0.06 mmol/L — ABNORMAL LOW (ref 0.60–1.20)

## 2015-08-19 MED ORDER — DOXEPIN HCL 25 MG PO CAPS
25.0000 mg | ORAL_CAPSULE | Freq: Every evening | ORAL | Status: DC | PRN
  Administered 2015-08-20: 25 mg via ORAL
  Filled 2015-08-19 (×6): qty 1

## 2015-08-19 MED ORDER — LITHIUM CARBONATE 150 MG PO CAPS
150.0000 mg | ORAL_CAPSULE | Freq: Three times a day (TID) | ORAL | Status: DC
  Administered 2015-08-19 – 2015-08-20 (×4): 150 mg via ORAL
  Filled 2015-08-19 (×10): qty 1

## 2015-08-19 MED ORDER — GABAPENTIN 100 MG PO CAPS
100.0000 mg | ORAL_CAPSULE | Freq: Every day | ORAL | Status: DC
  Administered 2015-08-19: 100 mg via ORAL
  Filled 2015-08-19 (×2): qty 1

## 2015-08-19 NOTE — Progress Notes (Signed)
[name] has been up and visible on the unit.  Interacting well with staff and peers.  Attending groups.  She denies any SI/HI or A/V hallucinations at this time.  She continues to report right and left foot pain in which Ibuprofen has been helpful.  She did complete her self inventory and reports that her depression, hopelessness and anxiety are 0/10.  She states that her goal for today is "going to groups" and she will accomplish her goals by "going to groups."  She appears to be in no physical distress.  Encouraged continued participation in group and unit activities.  Q 15 minute checks maintained for safety and we will continue to monitor the progress towards her goals.  Morgan [name] has remained safe on the unit.

## 2015-08-19 NOTE — Progress Notes (Signed)
Patient ID: Morgan [name], female   DOB: [birth year], 50 y.o.   MRN: [id number] PER STATE REGULATIONS 482.30  THIS CHART WAS REVIEWED FOR MEDICAL NECESSITY WITH RESPECT TO THE PATIENT'S ADMISSION/ DURATION OF STAY.  NEXT REVIEW DATE: 08/23/2015  Chauncy Lean, RN, BSN CASE MANAGER'

## 2015-08-19 NOTE — Progress Notes (Signed)
Adult Psychoeducational Group Note  Date:  08/19/2015 Time:  9:36 PM  Group Topic/Focus:  Wrap-Up Group:   The focus of this group is to help patients review their daily goal of treatment and discuss progress on daily workbooks.  Participation Level:  Active  Participation Quality:  Appropriate  Affect:  Appropriate  Cognitive:  Alert  Insight: Appropriate  Engagement in Group:  Engaged  Modes of Intervention:  Discussion  Additional Comments:  Patient goal for today was to attend all groups. On a scale from 1-10, (1=worse, 10= best) patient rated her day as a 10.  Morgan [name] L [name] 08/19/2015, 9:36 PM

## 2015-08-19 NOTE — Plan of Care (Signed)
Problem: Alteration in mood & ability to function due to Goal: STG-Patient will attend groups Outcome: Progressing Patient attended evening wrap up group and participated.     

## 2015-08-19 NOTE — [facility] Group Notes (Signed)
[facility] LCSW Group Therapy  08/19/2015 4:41 PM   Type of Therapy:  Group Therapy  Participation Level:  Active  Participation Quality:  Attentive  Affect:  Appropriate  Cognitive:  Appropriate  Insight:  Improving  Engagement in Therapy:  Engaged  Modes of Intervention:  Clarification, Education, Exploration and Socialization  Summary of Progress/Problems: Today's group focused on resilience.  We defined the term, and then gave personal examples. Morgan [name] stayed the entire time, and was engaged throughout.  Talked about the time she was homeless and despondent-took self inventory, decided she wanted things to be different and cashed in her life insurance against the advice of others and spent 3 months on a [place] refocusing herself.  She is no longer homeless and feels more optimistic about her future.  Gave others positive feedback and encouragment.  [name] 08/19/2015 , 4:41 PM
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
## 2015-08-19 NOTE — Progress Notes (Signed)
Writer spoke with Morgan [name] 1:1 and she appeared a little brighter today, smiling and up moving around more. She attended group tonight and participated. She requested medication for legs cramping/spasms and had inquired earlier about how was she going to sleep with nothing to take. Writer obtained an order for visteril 3 x daily prn which she received a dose with her robaxin and returned to her room to rest. She denies si/hi/a/v hallucinations Support given and safety maintained on unit with 15 min checks.

## 2015-08-20 ENCOUNTER — Ambulatory Visit (HOSPITAL_COMMUNITY): Payer: Self-pay | Admitting: Clinical

## 2015-08-20 MED ORDER — LITHIUM CARBONATE 150 MG PO CAPS: 150.0000 mg | ORAL_CAPSULE | Freq: Three times a day (TID) | ORAL | Status: DC

## 2015-08-20 MED ORDER — HYDROXYZINE HCL 25 MG PO TABS: 25.0000 mg | ORAL_TABLET | Freq: Three times a day (TID) | ORAL | Status: DC | PRN

## 2015-08-20 MED ORDER — DOXEPIN HCL 25 MG PO CAPS: 25.0000 mg | ORAL_CAPSULE | Freq: Every evening | ORAL | Status: DC | PRN
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
## 2015-08-20 NOTE — Progress Notes (Addendum)
D: Patient observed on unit interacting with peers and visitors. Patient in pleasant mood. Patient. States goal for today was " to attend all groups. " Patient states she meet goal today. A: Patient taken medication as ordered. Q 15 minute checks in progress and maintained. Patient provided support and encouragement.  R: Patient remains safe on unit and monitoring continues.

## 2015-08-20 NOTE — BHH Group Notes (Signed)
[place] Group Notes:  (Nursing/MHT/Case Management/Adjunct)  Date:  08/20/2015  Time: 9:30am  Type of Therapy:  Nurse Education  Participation Level:  Active  Participation Quality:  Appropriate  Affect:  Appropriate  Cognitive:  Appropriate  Insight:  Improving  Engagement in Group:  Supportive  Modes of Intervention:  Discussion and Education  Summary of Progress/Problems:  Group topic was Recovery.  Discussed goal setting, sleep hygiene and coping skills.  She talked about using her FitBit activity tracker and has noticed that she moves around a lot at night.  Encouraged her to talk with her doctor about the sleep issues as well.    Barbette Or Chaela Branscum 08/20/2015, 11:35 AM

## 2015-08-20 NOTE — BHH Suicide Risk Assessment (Signed)
[facility] Discharge Suicide Risk Assessment   Principal Problem: Borderline personality disorder Discharge Diagnoses:  Patient Active Problem List   Diagnosis Date Noted  . Borderline personality disorder [F60.3] 08/15/2015  . Severe benzodiazepine use disorder [F13.90] 08/15/2015  . Alcohol use disorder, moderate, dependence ([place]) [F10.20] 08/15/2015  . PTSD (post-traumatic stress disorder) [F43.10] 08/15/2015  . History of bipolar disorder [Z86.59] 08/15/2015  . Bronchopneumonia [J18.0] 07/10/2015  . Cough [R05] 06/19/2015  . Pre-syncope [R55] 05/17/2015  . Skull deformity [M95.2] 05/17/2015  . Headache [R51] 05/17/2015  . Patellofemoral syndrome of both knees [M22.2X1, M22.2X2] 05/02/2015  . Knee pain, bilateral [M25.561, M25.562] 03/05/2015    Total Time spent with patient: 30 minutes  Musculoskeletal: Strength & Muscle Tone: within normal limits Gait & Station: normal Patient leans: N/A  Psychiatric Specialty Exam: Review of Systems  Musculoskeletal: Positive for myalgias.  Psychiatric/Behavioral: The patient is nervous/anxious (improving) and has insomnia (mostly due to pain).   All other systems reviewed and are negative.   Blood pressure 127/65, pulse 75, temperature 98 F (36.7 C), temperature source Oral, resp. rate 16, height 5' 3.5" (1.613 m), weight 73.483 kg (162 lb), SpO2 98 %.Body mass index is 28.24 kg/(m^2).  General Appearance: Casual  Eye Contact::  Fair  Speech:  Clear and [id number]  Volume:  Normal  Mood:  Anxious improving  Affect:  Congruent  Thought Process:  Coherent  Orientation:  Full (Time, Place, and Person)  Thought Content:  WDL  Suicidal Thoughts:  No  Homicidal Thoughts:  No  Memory:  Immediate;   Fair Recent;   Fair Remote;   Fair  Judgement:  Fair  Insight:  Shallow  Psychomotor Activity:  Normal  Concentration:  Fair  Recall:  [employer] of Knowledge:Fair  Language: Fair  Akathisia:  No  Handed:  Right  AIMS (if indicated):      Assets:  Desire for Improvement  Sleep:  Number of Hours: 5  Cognition: WNL  ADL's:  Intact   Mental Status Per Nursing Assessment::   On Admission:  Self-harm behaviors  Demographic Factors:  Caucasian  Loss Factors: NA  Historical Factors: Impulsivity  Risk Reduction Factors:   Positive social support  Continued Clinical Symptoms:  Personality Disorders:   Cluster B Comorbid alcohol abuse/dependence Previous Psychiatric Diagnoses and Treatments Medical Diagnoses and Treatments/Surgeries  Cognitive Features That Contribute To Risk:  Polarized thinking    Suicide Risk:  Minimal: No identifiable suicidal ideation.  Patients presenting with no risk factors but with morbid ruminations; may be classified as minimal risk based on the severity of the depressive symptoms  Follow-up Information    Follow up with [facility] On 08/27/2015.   Why:  Tuesday at 8:45 with Velva Harman.  She will be contacting you this week with more information, and will be glad to answer any questions you have.  Or, you can reach her at this number   Contact information:   [place] recommendations:  Activity:  No restrictions Diet:  regular Tests:  Li level in 5 days Other:  Patient advised to follow up PMD about her abnormal TSH. Pt was restarted on Lithium since pt preferred to be on it and has a hx of having thyroid replacement along with Li therapy in the past. Pt to follow up with PMD for further management of thyroid abnormality , also given the fact that she is back on  Li therapy that can affect her thyroid function.  Zaydan Papesh, MD 08/20/2015, 10:00 AM

## 2015-08-20 NOTE — Progress Notes (Signed)
Morgan [name] has been visible on the unit.  She has been attending groups and interacting with peers.  She denies SI/HI or A/V hallucinations.  Self inventory completed and reports depression 0/10, hopelessness 0/10 and anxiety 0/10.  Goal for today is "prepar to leave, IOP, Appts, etc" an she will accomplish this goal by "getting with Social Worker-setting up IOP."  She c/o pain and Ibuprofen given prn. Pt. D/C from the unit to lobby accompanied by family.  She was pleasant and cooperative.  D/C instructions and medications reviewed with pt.  Pt. verbalized understanding of medications and d/c instructions.  Prescriptions and paperwork given to patient.  All belongings (from locker # 13-hat, sunglasses, cell phone, credit card holder, lotion) returned to pt. Q 15 min checks maintained until discharge.  Pt. Left the unit in no apparent distress.

## 2015-08-22 NOTE — Progress Notes (Signed)
  [facility] Adult Case Management Discharge Plan :  Will you be returning to the same living situation after discharge:  Yes,  home At discharge, do you have transportation home?: Yes,  friend Do you have the ability to pay for your medications: Yes,  insurance  Release of information consent forms completed and in the chart;  Patient's signature needed at discharge.  Patient to Follow up at: Follow-up Information    Follow up with [facility] On 08/27/2015.   Why:  Tuesday at 8:45 with Velva Harman.  She will be contacting you this week with more information, and will be glad to answer any questions you have.  Or, you can reach her at this number   Contact information:   [place] [phone]      Next level of care provider has access to [place] and Suicide Prevention discussed: Yes,  yes  Have you used any form of tobacco in the last 30 days? (Cigarettes, Smokeless Tobacco, Cigars, and/or Pipes): No  Has patient been referred to the Quitline?: N/A patient is not a smoker  Patient has been referred for addiction treatment: N/A  [name] 08/22/2015, 8:15 AM

## 2015-08-23 ENCOUNTER — Encounter: Payer: Self-pay | Admitting: Internal Medicine

## 2015-08-23 ENCOUNTER — Ambulatory Visit (INDEPENDENT_AMBULATORY_CARE_PROVIDER_SITE_OTHER): Payer: Medicare Other | Admitting: Internal Medicine

## 2015-08-23 VITALS — BP 116/86 | HR 81 | Temp 98.1°F | Resp 16 | Wt 167.0 lb

## 2015-08-23 DIAGNOSIS — E038 Other specified hypothyroidism: Secondary | ICD-10-CM | POA: Diagnosis not present

## 2015-08-23 MED ORDER — LEVOTHYROXINE SODIUM 50 MCG PO TABS: 50.0000 ug | ORAL_TABLET | Freq: Every day | ORAL | Status: DC

## 2015-08-23 NOTE — Progress Notes (Signed)
Subjective:    Patient ID: Morgan [name], female    DOB: [birth year], 50 y.o.   MRN: [id number]  HPI She is here for follow up of possible thyroid problems.   Elevated tsh:  Her tsh was recently checked by behavioral health because she is on lithium and it was elevated.  She has been on mediation for her thyroid in the past, but it was stopped.    She is still having pain , swelling in the right ankle from her sprain.  She was taking motrin, but stopped because it was not helping.  She still has a lot of swelling and pain.   Medications and allergies reviewed with patient and updated if appropriate.  Patient Active Problem List   Diagnosis Date Noted  . Borderline personality disorder 08/15/2015  . Severe benzodiazepine use disorder 08/15/2015  . Alcohol use disorder, moderate, dependence ([place]) 08/15/2015  . PTSD (post-traumatic stress disorder) 08/15/2015  . History of bipolar disorder 08/15/2015  . Bronchopneumonia 07/10/2015  . Cough 06/19/2015  . Pre-syncope 05/17/2015  . Skull deformity 05/17/2015  . Headache 05/17/2015  . Patellofemoral syndrome of both knees 05/02/2015  . Knee pain, bilateral 03/05/2015    Current Outpatient Prescriptions on File Prior to Visit  Medication Sig Dispense Refill  . albuterol (PROVENTIL HFA;VENTOLIN HFA) 108 (90 Base) MCG/ACT inhaler Inhale into the lungs every 6 (six) hours as needed for wheezing or shortness of breath.    . doxepin (SINEQUAN) 25 MG capsule Take 1 capsule (25 mg total) by mouth at bedtime as needed. 30 capsule 0  . hydrOXYzine (ATARAX/VISTARIL) 25 MG tablet Take 1 tablet (25 mg total) by mouth 3 (three) times daily as needed for anxiety. 30 tablet 0  . hyoscyamine (LEVSIN, ANASPAZ) 0.125 MG tablet Take 0.125 mg by mouth as needed for cramping.     . lithium carbonate 150 MG capsule Take 1 capsule (150 mg total) by mouth 3 (three) times daily with meals. 90 capsule 0   No current facility-administered medications on file  prior to visit.    Past Medical History  Diagnosis Date  . Asthma   . IBS (irritable bowel syndrome) 1995  . Arthritis   . Degenerative disorder of bone   . Bulging lumbar disc 07/19/05  . Bipolar disorder ([place])   . History of borderline personality disorder   . Anxiety   . Hypothyroidism 2007    Developed after use of Lithium  . Chronic headaches   . Colon polyps   . Proctitis   . Depression   . Pneumonia     Past Surgical History  Procedure Laterality Date  . Cholecystectomy    . Tubal ligation    . Abdominal hysterectomy    . Shoulder open rotator cuff repair Right 03/2010  . Bunionectomy Right 06/2012  . Appendectomy      Social History   Social History  . Marital Status: Divorced    Spouse Name: N/A  . Number of Children: 2  . Years of Education: 14   Occupational History  . Unemployed    Social History Main Topics  . Smoking status: Former Smoker    Quit date: 07/20/1980  . Smokeless tobacco: Never Used  . Alcohol Use: 0.0 oz/week    0 Standard drinks or equivalent per week     Comment: occ  . Drug Use: Yes    Special: Benzodiazepines  . Sexual Activity: No     Comment: intercourse age unknown,sexual partners less than  5   Other Topics Concern  . None   Social History Narrative   Fun: [place], travel, music, writing, walking, hiking, volunteering   Denies religious beliefs effecting health care.    Feels safe at home.     Family History  Problem Relation Age of Onset  . Depression Mother   . Hypertension Mother   . Post-traumatic stress disorder Sister   . Alcohol abuse Brother   . Drug abuse Brother   . Post-traumatic stress disorder Brother   . Thyroid disease Father   . Alzheimer's disease Father   . COPD Maternal Grandmother   . Heart disease Maternal Grandfather   . Arthritis Paternal Grandmother   . Diabetes Paternal Grandmother   . Depression Paternal Grandmother   . Alzheimer's disease Paternal Grandmother   . Heart disease  Paternal Grandfather   . Coronary artery disease Paternal Grandfather   . Alcohol abuse Paternal Grandfather     Review of Systems  Constitutional: Positive for chills. Negative for fever.  Respiratory: Positive for cough and wheezing. Negative for shortness of breath.   Cardiovascular: Positive for leg swelling (right ankle swelling - injury related). Negative for chest pain and palpitations.  Gastrointestinal:       No GERD  Neurological: Negative for dizziness, light-headedness and headaches.       Objective:   Filed Vitals:   08/23/15 1432  BP: 116/86  Pulse: 81  Temp: 98.1 F (36.7 C)  Resp: 16   Filed Weights   08/23/15 1432  Weight: 167 lb (75.751 kg)   Body mass index is 29.12 kg/(m^2).   Physical Exam Constitutional: Appears well-developed and well-nourished. No distress.  Neck: Neck supple. No tracheal deviation present. No thyromegaly present.  No carotid bruit. No cervical adenopathy.   Cardiovascular: Normal rate, regular rhythm and normal heart sounds.   No murmur heard.  mild edema right ankle - injury related Pulmonary/Chest: Effort normal and breath sounds normal. No respiratory distress. No wheezes.      Assessment & Plan:   See Problem List for Assessment and Plan of chronic medical problems.

## 2015-08-23 NOTE — Progress Notes (Signed)
Pre visit review using our clinic review tool, if applicable. No additional management support is needed unless otherwise documented below in the visit note. 

## 2015-08-23 NOTE — Assessment & Plan Note (Signed)
Related to lithium, ? Underlying hypothyroidism  Was on medication in the past as well - but d/c'd for some reason Start levothyroxine 50 mcg daily Recheck tfts in 6-8 weeks

## 2015-08-23 NOTE — Patient Instructions (Signed)
We will start a medication for your thyroid and we need to recheck your thyroid blood work in 6-8 weeks.  The blood work has been ordered and you can go directly to the lab at this time and get the blood work done.  We will adjust the medication if needed.   Your prescription(s) have been submitted to your pharmacy. Please take as directed and contact our office if you believe you are having problem(s) with the medication(s).

## 2015-08-26 ENCOUNTER — Other Ambulatory Visit (HOSPITAL_COMMUNITY): Payer: Medicare Other | Attending: Psychiatry | Admitting: Psychiatry

## 2015-08-26 ENCOUNTER — Encounter (HOSPITAL_COMMUNITY): Payer: Self-pay

## 2015-08-26 DIAGNOSIS — F4310 Post-traumatic stress disorder, unspecified: Secondary | ICD-10-CM | POA: Insufficient documentation

## 2015-08-26 DIAGNOSIS — G4700 Insomnia, unspecified: Secondary | ICD-10-CM | POA: Insufficient documentation

## 2015-08-26 DIAGNOSIS — R45851 Suicidal ideations: Secondary | ICD-10-CM | POA: Diagnosis not present

## 2015-08-26 DIAGNOSIS — F603 Borderline personality disorder: Secondary | ICD-10-CM | POA: Diagnosis not present

## 2015-08-26 DIAGNOSIS — E039 Hypothyroidism, unspecified: Secondary | ICD-10-CM | POA: Diagnosis not present

## 2015-08-26 DIAGNOSIS — Z87891 Personal history of nicotine dependence: Secondary | ICD-10-CM | POA: Diagnosis not present

## 2015-08-26 DIAGNOSIS — Z590 Homelessness: Secondary | ICD-10-CM | POA: Insufficient documentation

## 2015-08-26 DIAGNOSIS — F319 Bipolar disorder, unspecified: Secondary | ICD-10-CM | POA: Diagnosis not present

## 2015-08-26 NOTE — Progress Notes (Signed)
    Daily Group Progress Note  Program: IOP  Group Time: 9:00-10:30  Participation Level: Active  Behavioral Response: Appropriate  Type of Therapy:  Psycho-education Group  Summary of Progress: Pt. Met with case manager and joined end of medication management education group.     Group Time: 10:30-12:00  Participation Level:  Active  Behavioral Response: Appropriate  Type of Therapy: Group Therapy  Summary of Progress: Pt. Presented with primarily flat affect, quiet, responsive to questions from the group. Pt. Introduced herself to the group. Pt. Shared her mental health history with bipolar 1 diagnosis and difficulties with her psychiatric providers.  Nancie Neas, LPC

## 2015-08-26 NOTE — Progress Notes (Signed)
Comprehensive Clinical Assessment (CCA) Note  08/26/2015 Morgan [name] [id number]  Visit Diagnosis:      ICD-9-CM ICD-10-CM   1. Bipolar I disorder (HCC) 296.7 F31.9   2. Borderline personality disorder 301.83 F60.3   3. Post traumatic stress disorder 309.81 F43.10       CCA Part One  Part One has been completed on paper by the patient.  (See scanned document in Chart Review)  CCA Part Two A  Intake/Chief Complaint:  CCA Intake With Chief Complaint CCA Part Two Date: 08/26/15 CCA Part Two Time: 1406 Chief Complaint/Presenting Problem: This is a 50 yr old divorced, disabled, Caucasian female, who trasitioned from [facility] inpt unit.  Pt was hospitalized 08-15-15 through 08-20-15.  Pt was admitted due to OD (Ativan).  According to pt, she has been struggling with an array of dx for fourteen years.  "I am bipolar, borderline, OCD, and have PTSD.  Pt states she moved from [place] in May 2016.  States she was homeless there.  Pt states sx's were worsening and she's been coping by self medicating via ETOH.  Pt states Oct. 2016 she weaned herself off a particular medication, Nov. 2016 had foot surgery and ended up with pneumonia.  Reports that's when she really started spiraling down.  Triggers/Stressors:  1)  Housing:  Pt resides with three 70 yr olds.  Pt's daughter is one of those young adults.  "She moved in last August.  I hadn't lived with her for six years."  They share a room.  Pt states it has been very difficult to live with them because they don't clean up behind themselves.  Past psychiatric hospitalizations:  three prior (twice in [employer] in 2015 and 2017 in [place].  Multiple suicide attempts and a hx of self injurious behaviors (cutting on arms, groin area, and stomach.  Recent cut was December 2016. Patients Currently Reported Symptoms/Problems: tearfulness, low self-esteem, anxiety, indecisiveness, poor sleep Collateral Involvement: adult children and church are  supportive. Individual's Strengths: Pt appears to be motivated for treatment. Initial Clinical Notes/Concerns: pt is high risk due to hx of multiple suicide attempts and self injurious behaviors.  Mental Health Symptoms Depression:  Depression: Change in energy/activity, Difficulty Concentrating, Irritability, Sleep (too much or little), Tearfulness  Mania:  Mania: N/A  Anxiety:   Anxiety: Restlessness, Sleep, Difficulty concentrating  Psychosis:  Psychosis: N/A  Trauma:  Trauma: Difficulty staying/falling asleep, Emotional numbing  Obsessions:  Obsessions: N/A  Compulsions:  Compulsions: N/A  Inattention:  Inattention: N/A  Hyperactivity/Impulsivity:  Hyperactivity/Impulsivity: N/A  Oppositional/Defiant Behaviors:  Oppositional/Defiant Behaviors: N/A  Borderline Personality:  Emotional Irregularity: Mood lability, Recurrent suicidal behaviors/gestures/threats  Other Mood/Personality Symptoms:      Mental Status Exam Appearance and self-care  Stature:  Stature: Average  Weight:  Weight: Average weight  Clothing:  Clothing: Casual  Grooming:  Grooming: Normal  Cosmetic use:  Cosmetic Use: None  Posture/gait:  Posture/Gait: Normal  Motor activity:  Motor Activity: Not Remarkable  Sensorium  Attention:  Attention: Normal  Concentration:  Concentration: Anxiety interferes  Orientation:  Orientation: X5  Recall/memory:  Recall/Memory: Normal  Affect and Mood  Affect:  Affect: Labile  Mood:  Mood: Depressed, Anxious  Relating  Eye contact:  Eye Contact: Normal  Facial expression:  Facial Expression: Anxious  Attitude toward examiner:  Attitude Toward Examiner: Manipulative  Thought and Language  Speech flow: Speech Flow: Normal  Thought content:  Thought Content: Appropriate to mood and circumstances  Preoccupation:  Preoccupations: Ruminations  Hallucinations:  Hallucinations:  (n/a)  Organization:     Transport planner of Knowledge:  Fund of Knowledge: Impoverished  by:  (Comment) (fair)  Intelligence:  Intelligence: Average  Abstraction:  Abstraction: Functional  Judgement:  Judgement: Fair  Art therapist:  Reality Testing: Distorted  Insight:  Insight: Poor  Decision Making:  Decision Making: Impulsive  Social Functioning  Social Maturity:  Social Maturity: Impulsive  Social Judgement:  Social Judgement: Impropriety, Heedless  Stress  Stressors:  Stressors: Family conflict, Housing  Coping Ability:  Coping Ability: Deficient supports  Skill Deficits:     Supports:      Family and Psychosocial History:    Childhood History:     CCA Part Two B  Employment/Work Situation: Employment / Work Copywriter, advertising Employment situation: On disability Did You Receive Any Psychiatric Treatment/Services While in Passenger transport manager?:  (n/a)  Education: Education Did Teacher, adult education From [employer]?: Yes Did Physicist, medical?: Yes What Type of College Degree Do you Have?: attended some college Did You Have An Individualized Education Program (IIEP): No Did You Have Any Difficulty At [employer]?: Yes (States although school was her "happy place," she wasn't a good Ship broker.    ) Were Any Medications Ever Prescribed For These Difficulties?: No  Religion: Religion/Spirituality Are You A Religious Person?: Yes What is Your Religious Affiliation?: Unknown  Leisure/Recreation: Leisure / Recreation Leisure and Hobbies: Any outdoor activities  Exercise/Diet: Exercise/Diet Do You Exercise?: Yes What Type of Exercise Do You Do?: Run/Walk (walking) How Many Times a Week Do You Exercise?: Daily Have You Gained or Lost A Significant Amount of Weight in the Past Six Months?: No Do You Follow a Special Diet?: No Do You Have Any Trouble Sleeping?: Yes Explanation of Sleeping Difficulties: difficulty falling asleep  CCA Part Two C  Alcohol/Drug Use:                        CCA Part Three  ASAM's:  Six Dimensions of Multidimensional  Assessment  Dimension 1:  Acute Intoxication and/or Withdrawal Potential:     Dimension 2:  Biomedical Conditions and Complications:     Dimension 3:  Emotional, Behavioral, or Cognitive Conditions and Complications:     Dimension 4:  Readiness to Change:     Dimension 5:  Relapse, Continued use, or Continued Problem Potential:     Dimension 6:  Recovery/Living Environment:      Substance use Disorder (SUD)    Social Function:  Social Functioning Social Maturity: Impulsive Social Judgement: Impropriety, Heedless  Stress:  Stress Stressors: Family conflict, Housing Coping Ability: Deficient supports Patient Takes Medications The Way The Doctor Instructed?: No (hx of non-compliancy with medications.) Priority Risk: High Risk  Risk Assessment- Self-Harm Potential: Risk Assessment For Self-Harm Potential Thoughts of Self-Harm: Vague current thoughts Method: No plan Availability of Means: Have close by Additional Information for Self-Harm Potential: Family History of Suicide, Previous Attempts Additional Comments for Self-Harm Potential: Patient was able to contract for safety.  Risk Assessment -Dangerous to Others Potential: Risk Assessment For Dangerous to Others Potential Method: No Plan Availability of Means: No access or NA Intent: Vague intent or NA (n/a) Notification Required: No need or identified person (n/a)  DSM5 Diagnoses: Patient Active Problem List   Diagnosis Date Noted  . Other specified hypothyroidism 08/23/2015  . Borderline personality disorder 08/15/2015  . Severe benzodiazepine use disorder 08/15/2015  . Alcohol use disorder, moderate, dependence ([place]) 08/15/2015  . PTSD (post-traumatic stress disorder) 08/15/2015  .  History of bipolar disorder 08/15/2015  . Bronchopneumonia 07/10/2015  . Cough 06/19/2015  . Pre-syncope 05/17/2015  . Skull deformity 05/17/2015  . Headache 05/17/2015  . Patellofemoral syndrome of both knees 05/02/2015  . Knee pain,  bilateral 03/05/2015    Patient Centered Plan: Patient is on the following Treatment Plan(s):  Anxiety, Borderline Personality and PTSD  Recommendations for Services/Supports/Treatments: Recommendations for Services/Supports/Treatments Recommendations For Services/Supports/Treatments: IOP (Intensive Outpatient Program)  Treatment Plan Summary:  Pt will attend [facility] for two weeks.  She will participate in group therapy and a psycho-educational group daily.  Encouraged support groups.  Will refer pt back to therapist and psychiatrist.  Referrals to Alternative Service(s): Referred to Alternative Service(s):   Place:   Date:   Time:    Referred to Alternative Service(s):   Place:   Date:   Time:    Referred to Alternative Service(s):   Place:   Date:   Time:    Referred to Alternative Service(s):   Place:   Date:   Time:     CLARK, RITA, M.Ed, CNA

## 2015-08-27 ENCOUNTER — Other Ambulatory Visit (HOSPITAL_COMMUNITY): Payer: Medicare Other | Admitting: Psychiatry

## 2015-08-27 ENCOUNTER — Encounter (HOSPITAL_COMMUNITY): Payer: Self-pay | Admitting: Psychiatry

## 2015-08-27 DIAGNOSIS — F319 Bipolar disorder, unspecified: Secondary | ICD-10-CM | POA: Insufficient documentation

## 2015-08-27 DIAGNOSIS — Z87891 Personal history of nicotine dependence: Secondary | ICD-10-CM | POA: Diagnosis not present

## 2015-08-27 DIAGNOSIS — F4310 Post-traumatic stress disorder, unspecified: Secondary | ICD-10-CM | POA: Diagnosis not present

## 2015-08-27 DIAGNOSIS — E039 Hypothyroidism, unspecified: Secondary | ICD-10-CM | POA: Diagnosis not present

## 2015-08-27 DIAGNOSIS — G4700 Insomnia, unspecified: Secondary | ICD-10-CM | POA: Diagnosis not present

## 2015-08-27 DIAGNOSIS — F603 Borderline personality disorder: Secondary | ICD-10-CM | POA: Diagnosis not present

## 2015-08-27 NOTE — Progress Notes (Signed)
Psychiatric Initial Adult Assessment   Patient Identification: Morgan [name] MRN:  [id number] Date of Evaluation:  08/27/2015 Referral Source: [facility] inpatient Chief Complaint:   Visit Diagnosis:    ICD-9-CM ICD-10-CM   1. Bipolar 1 disorder, depressed ([place]) 296.50 F31.9    Diagnosis:   Patient Active Problem List   Diagnosis Date Noted  . Bipolar 1 disorder, depressed ([place]) [F31.9] 08/27/2015    Priority: Medium    Class: Chronic  . Other specified hypothyroidism [E03.8] 08/23/2015  . Borderline personality disorder [F60.3] 08/15/2015  . Severe benzodiazepine use disorder [F13.90] 08/15/2015  . Alcohol use disorder, moderate, dependence ([place]) [F10.20] 08/15/2015  . PTSD (post-traumatic stress disorder) [F43.10] 08/15/2015  . History of bipolar disorder [Z86.59] 08/15/2015  . Bronchopneumonia [J18.0] 07/10/2015  . Cough [R05] 06/19/2015  . Pre-syncope [R55] 05/17/2015  . Skull deformity [M95.2] 05/17/2015  . Headache [R51] 05/17/2015  . Patellofemoral syndrome of both knees [M22.2X1, M22.2X2] 05/02/2015  . Knee pain, bilateral [M25.561, M25.562] 03/05/2015   History of Present Illness:  [name] has at least a 14 year h;istory of psychiatric problems with multiple hospitalizations.  She says she had been diagnosed with bipolar, borderline personality. PTSD and OCD as well as having a drinking problem at times.  She responded best to lithium for 7 years but then it stopped working and she stopped it.  She had foot surgery in Nov 2016 followed by pneumonia and was unable to walk which threw her into a depression that spiraled out of control to the point of taking all of her lorazepam over a period of days and drinking on top of that and not really recalling what all she did.  She ended up in the hospital and maybe feels some better b ut not much.  She is still hopeless and sees no reason to live.  She just wants to die.  Her belief in God is the only thing keeping her from killing  herself.  She feels obligated to come to IOP in order to continue seeing her outpatient therapist but sees it as a waste of time.  She is supposed to be taking Lithium again but has skipped doses already.  Says she does not know why, she just does not want to be on medications.  She is not committed to getting better feeling she would rather be dead.finances are a stress as is having to share a room and an apartment with her 60 year old daughter and her 2 friends to make ends meet.  She was recently homeless.  Her church family is a support for her but she has not been seeing them much recently. Elements:  Location:  depressed. Quality:  wants to be dead. Severity:  as above. Timing:  stopped lithium after it stopped working. Duration:  14 years at least. Context:  as above. Associated Signs/Symptoms: Depression Symptoms:  depressed mood, anhedonia, insomnia, fatigue, difficulty concentrating, hopelessness, impaired memory, recurrent thoughts of death, suicidal thoughts without plan, suicidal attempt, anxiety, panic attacks, loss of energy/fatigue, (Hypo) Manic Symptoms:  Irritable Mood, Anxiety Symptoms:  Excessive Worry, Psychotic Symptoms:  none PTSD Symptoms: Negative  Past Medical History:  Past Medical History  Diagnosis Date  . Asthma   . IBS (irritable bowel syndrome) 1995  . Arthritis   . Degenerative disorder of bone   . Bulging lumbar disc 07/19/05  . Bipolar disorder ([place])   . History of borderline personality disorder   . Anxiety   . Hypothyroidism 2007  Developed after use of Lithium  . Chronic headaches   . Colon polyps   . Proctitis   . Depression   . Pneumonia     Past Surgical History  Procedure Laterality Date  . Cholecystectomy    . Tubal ligation    . Abdominal hysterectomy    . Shoulder open rotator cuff repair Right 03/2010  . Bunionectomy Right 06/2012  . Appendectomy     Family History:  Family History  Problem Relation Age of Onset   . Depression Mother   . Hypertension Mother   . Post-traumatic stress disorder Sister   . Alcohol abuse Brother   . Drug abuse Brother   . Post-traumatic stress disorder Brother   . Thyroid disease Father   . Alzheimer's disease Father   . COPD Maternal Grandmother   . Heart disease Maternal Grandfather   . Arthritis Paternal Grandmother   . Diabetes Paternal Grandmother   . Depression Paternal Grandmother   . Alzheimer's disease Paternal Grandmother   . Heart disease Paternal Grandfather   . Coronary artery disease Paternal Grandfather   . Alcohol abuse Paternal Grandfather    Social History:   Social History   Social History  . Marital Status: Divorced    Spouse Name: N/A  . Number of Children: 2  . Years of Education: 14   Occupational History  . Unemployed    Social History Main Topics  . Smoking status: Former Smoker    Quit date: 07/20/1980  . Smokeless tobacco: Never Used  . Alcohol Use: 0.0 oz/week    0 Standard drinks or equivalent per week     Comment: occ  . Drug Use: Yes    Special: Benzodiazepines  . Sexual Activity: No     Comment: intercourse age unknown,sexual partners less than 5   Other Topics Concern  . None   Social History Narrative   Fun: [place], travel, music, writing, walking, hiking, volunteering   Denies religious beliefs effecting health care.    Feels safe at home.    Additional Social History: none  Musculoskeletal: Strength & Muscle Tone: within normal limits Gait & Station: normal Patient leans: N/A  Psychiatric Specialty Exam: HPI  ROS  There were no vitals taken for this visit.There is no weight on file to calculate BMI.  General Appearance: Fairly Groomed  Eye Contact:  Good  Speech:  Clear and Coherent  Volume:  Normal  Mood:  Depressed  Affect:  Congruent  Thought Process:  Coherent and Logical  Orientation:  Full (Time, Place, and Person)  Thought Content:  Negative  Suicidal Thoughts:  Yes.  without  intent/plan  Homicidal Thoughts:  No  Memory:  Immediate;   Good Recent;   Good Remote;   Good  Judgement:  Intact  Insight:  Fair  Psychomotor Activity:  Normal  Concentration:  Good  Recall:  Good  Fund of Knowledge:Good  Language: Good  Akathisia:  Negative  Handed:  Right  AIMS (if indicated):  0  Assets:  Communication Skills Housing  ADL's:  Intact  Cognition: WNL  Sleep:  poor   Is the patient at risk to self?  Yes.   Has the patient been a risk to self in the past 6 months?  Yes.   Has the patient been a risk to self within the distant past?  Yes.   Is the patient a risk to others?  No. Has the patient been a risk to others in the past 6 months?  No. Has the patient been a risk to others within the distant past?  No.  Allergies:   Allergies  Allergen Reactions  . Abilify [Aripiprazole] Swelling    Throat begins to swell   . Ciprofloxacin Shortness Of Breath  . Lamictal [Lamotrigine] Rash  . Amoxicillin Hives    Has patient had a PCN reaction causing immediate rash, facial/tongue/throat swelling, SOB or lightheadedness with hypotension: yes Has patient had a PCN reaction causing severe rash involving mucus membranes or skin necrosis: no Has patient had a PCN reaction that required hospitalization no Has patient had a PCN reaction occurring within the last 10 years: no If all of the above answers are "NO", then may proceed with Cephalosporin use.   Marland Kitchen Doxycycline Hives  . Latex Rash  . Meloxicam Other (See Comments)    Induces mania  . Mirapex [Pramipexole] Hives  . Prednisone Other (See Comments)    Interacts with Lithium. " All steroids"  . Risperdal [Risperidone] Hives  . Tape Rash    "steri strips"  . Cortisone     Exacerbates the mania of her bipolar  . Naproxen     Interacts with lithium.   Marland Kitchen Percocet [Oxycodone-Acetaminophen] Other (See Comments)    Severe dizziness  . Other Rash    Throat swelling and rash to WALNUTS and PINE NUTS  . Peanuts  [Peanut Oil] Rash    Throat swelling  . Sulfa Antibiotics Rash   Current Medications: Current Outpatient Prescriptions  Medication Sig Dispense Refill  . albuterol (PROVENTIL HFA;VENTOLIN HFA) 108 (90 Base) MCG/ACT inhaler Inhale into the lungs every 6 (six) hours as needed for wheezing or shortness of breath.    . doxepin (SINEQUAN) 25 MG capsule Take 1 capsule (25 mg total) by mouth at bedtime as needed. 30 capsule 0  . hydrOXYzine (ATARAX/VISTARIL) 25 MG tablet Take 1 tablet (25 mg total) by mouth 3 (three) times daily as needed for anxiety. 30 tablet 0  . hyoscyamine (LEVSIN, ANASPAZ) 0.125 MG tablet Take 0.125 mg by mouth as needed for cramping.     Marland Kitchen levothyroxine (SYNTHROID, LEVOTHROID) 50 MCG tablet Take 1 tablet (50 mcg total) by mouth daily. 90 tablet 3  . lithium carbonate 150 MG capsule Take 1 capsule (150 mg total) by mouth 3 (three) times daily with meals. 90 capsule 0   No current facility-administered medications for this visit.    Previous Psychotropic Medications: Yes   Substance Abuse History in the last 12 months:  Yes.   drinks alcohol to feel better Consequences of Substance Abuse: Negative  Medical Decision Making:  Established Problem, Worsening (2)  Treatment Plan Summary: daily group therapy    Donnelly Angelica 2/7/201712:44 PM

## 2015-08-28 ENCOUNTER — Other Ambulatory Visit (HOSPITAL_COMMUNITY): Payer: Medicare Other | Admitting: Psychiatry

## 2015-08-28 DIAGNOSIS — F319 Bipolar disorder, unspecified: Secondary | ICD-10-CM | POA: Diagnosis not present

## 2015-08-28 DIAGNOSIS — E039 Hypothyroidism, unspecified: Secondary | ICD-10-CM | POA: Diagnosis not present

## 2015-08-28 DIAGNOSIS — Z87891 Personal history of nicotine dependence: Secondary | ICD-10-CM | POA: Diagnosis not present

## 2015-08-28 DIAGNOSIS — F4310 Post-traumatic stress disorder, unspecified: Secondary | ICD-10-CM | POA: Diagnosis not present

## 2015-08-28 DIAGNOSIS — F603 Borderline personality disorder: Secondary | ICD-10-CM | POA: Diagnosis not present

## 2015-08-28 DIAGNOSIS — G4700 Insomnia, unspecified: Secondary | ICD-10-CM | POA: Diagnosis not present

## 2015-08-28 NOTE — Progress Notes (Signed)
    Daily Group Progress Note  Program: IOP  Group Time: 9:00-10:30  Participation Level: Active  Behavioral Response: Appropriate  Type of Therapy:  Group Therapy  Summary of Progress: Pt. Presented as quiet, but engaged in the group process. Pt. Discussed treatment burden associated with medication. Pt. Shared with the group that she was experiencing significant body pain today.     Group Time: 10:30-12:00  Participation Level:  Active  Behavioral Response: Appropriate  Type of Therapy: Psycho-education Group  Summary of Progress: Pt. Participated in yoga session facilitated by Jan Fireman.   Nancie Neas, LPC

## 2015-08-29 ENCOUNTER — Other Ambulatory Visit (HOSPITAL_COMMUNITY): Payer: Medicare Other | Admitting: Psychiatry

## 2015-08-29 DIAGNOSIS — F4310 Post-traumatic stress disorder, unspecified: Secondary | ICD-10-CM | POA: Diagnosis not present

## 2015-08-29 DIAGNOSIS — F603 Borderline personality disorder: Secondary | ICD-10-CM | POA: Diagnosis not present

## 2015-08-29 DIAGNOSIS — Z87891 Personal history of nicotine dependence: Secondary | ICD-10-CM | POA: Diagnosis not present

## 2015-08-29 DIAGNOSIS — E039 Hypothyroidism, unspecified: Secondary | ICD-10-CM | POA: Diagnosis not present

## 2015-08-29 DIAGNOSIS — F319 Bipolar disorder, unspecified: Secondary | ICD-10-CM | POA: Diagnosis not present

## 2015-08-29 DIAGNOSIS — G4700 Insomnia, unspecified: Secondary | ICD-10-CM | POA: Diagnosis not present

## 2015-08-29 NOTE — Progress Notes (Signed)
    Daily Group Progress Note  Program: IOP  Group Time: 9:00-10:30  Participation Level: Active  Behavioral Response: Appropriate  Type of Therapy:  Group Therapy  Summary of Progress: Pt. Presented as alert and attentive to the group process. Pt. Received support from other group member with resource of [employer]. Resources from [place] were discussed. Pt. Participated in discussion about patterns of co-dependency, patterns of under-valuing and caring for self that are learned in families chemical dependent histories.     Group Time: 10:30-12:00  Participation Level:  Active  Behavioral Response: Resistant  Type of Therapy: Group Therapy  Summary of Progress: Pt. Appeared quiet, withdrew from group process. Pt. Moved from one chair to another chair. Pt. Reported to the group that her body was hurting and that she did not want to talk. Pt. Asked for permission to leave the group 10 minutes before the end of group.  Nancie Neas, LPC

## 2015-08-29 NOTE — Progress Notes (Signed)
    Daily Group Progress Note  Program: IOP  Group Time: 9:00-10:30  Participation Level: Active  Behavioral Response: Appropriate  Type of Therapy:  Group Therapy  Summary of Progress: Pt. Presents with bright affect, actively involved in group process, receives suggestions and feedback from group members. Pt. Discussed desire for autonomy and independence in her home life.     Group Time: 10:30-12:00  Participation Level:  Active  Behavioral Response: Appropriate  Type of Therapy: Psycho-education Group  Summary of Progress: Pt. Participated in instruction about use of grounding series and RAIN method of meditation i.e., recognize, allowing, investigating, and non-identification.  Nancie Neas, LPC

## 2015-08-30 ENCOUNTER — Other Ambulatory Visit (HOSPITAL_COMMUNITY): Payer: Medicare Other | Admitting: Psychiatry

## 2015-08-30 DIAGNOSIS — Z87891 Personal history of nicotine dependence: Secondary | ICD-10-CM | POA: Diagnosis not present

## 2015-08-30 DIAGNOSIS — E039 Hypothyroidism, unspecified: Secondary | ICD-10-CM | POA: Diagnosis not present

## 2015-08-30 DIAGNOSIS — G4700 Insomnia, unspecified: Secondary | ICD-10-CM | POA: Diagnosis not present

## 2015-08-30 DIAGNOSIS — F319 Bipolar disorder, unspecified: Secondary | ICD-10-CM | POA: Diagnosis not present

## 2015-08-30 DIAGNOSIS — F603 Borderline personality disorder: Secondary | ICD-10-CM | POA: Diagnosis not present

## 2015-08-30 DIAGNOSIS — F4310 Post-traumatic stress disorder, unspecified: Secondary | ICD-10-CM | POA: Diagnosis not present

## 2015-08-30 NOTE — Progress Notes (Signed)
    Daily Group Progress Note  Program: IOP  Group Time: 9:00-10:30  Participation Level: None  Behavioral Response: Resistant and Attention-Seeking  Type of Therapy:  Group Therapy  Summary of Progress: Pt. Did not speak in group, appeared withdrawn, asked to leave group at 9:45.     Group Time: 10:30-12:00  Participation Level:  None  Behavioral Response: Did not attend  Type of Therapy: Psycho-education Group  Summary of Progress: Pt. Did not attend.  Nancie Neas, LPC

## 2015-09-02 ENCOUNTER — Other Ambulatory Visit (HOSPITAL_COMMUNITY): Payer: Medicare Other | Admitting: Psychiatry

## 2015-09-02 ENCOUNTER — Telehealth (HOSPITAL_COMMUNITY): Payer: Self-pay | Admitting: Clinical

## 2015-09-02 NOTE — Telephone Encounter (Signed)
Late Entry:  08-30-15 0945  D:  Pt came in writer's office stating that she wanted to stop attending [place].  "I suppose to be learning coping skills and I am not.  I don't have to be here to learn about the school system and food."  Pt denied SI/HI or A/V hallucinations.  A:  Reiterated to pt that her therapist [name], LCSW) had instructed that she complete MH-IOP before returning to her for counseling.  Pt reports that she is aware of that, but she still doesn't want to continue in the groups.  Encouraged pt to think about it over the weekend and return on 09-02-15 to discuss.  Writer will discuss pt with [name], LCSW that morning.  R:  Pt receptive.

## 2015-09-02 NOTE — Patient Instructions (Addendum)
Patient requesting discharge today.  Will follow up with therapist of choice (will mail list of therapists) and Jobie Quaker, [facility] Coria in March 2017.  Encouraged support groups.

## 2015-09-02 NOTE — Telephone Encounter (Addendum)
Morgan [name] left message on clinician's phone that she was disappointed with not having closure with clinician. (Client had 2 requirements after discharging form inpatient before returning 1) IOP 2) have prescribing Dr in [facility] system. Morgan [name] self elected to discharge from IOP and therapy services

## 2015-09-02 NOTE — Telephone Encounter (Signed)
D:  Pt phoned and left vm, shortly after she walked out of office.  Pt voiced how disappointed she was that she couldn't see her therapist [name], LCSW) any longer.  "She was really helping me.  I don't do well with this kind of closure; although I hate closure anyway.  I didn't leave a message for her because it probably wouldn't have been nice.  I feel she's being a coward for going through someone else and not talking to me herself."  A:  Inform [name], LCSW and Dr. Lovena Le.

## 2015-09-02 NOTE — Patient Instructions (Signed)
D:  Pt requesting discharge today.  Due to ending program before completion; pt can't return to [employer], [place].  Will mail patient a list of recommended therapists.  F/U with Jobie Quaker, PA-C in March 2017.  Encouraged support groups.

## 2015-09-02 NOTE — Progress Notes (Signed)
Mursal Lopata is a 50 y.o. divorced, disabled, Caucasian female, who trasitioned from [facility] inpt unit. Pt was hospitalized 08-15-15 through 08-20-15. Pt was admitted due to OD (Ativan). According to pt, she has been struggling with an array of dx for fourteen years. "I am bipolar, borderline, OCD, and have PTSD. Pt states she moved from [place] in May 2016. Stated she was homeless there. Pt stated sx's were worsening and she's been coping by self medicating via ETOH. Pt stated Oct. 2016 she weaned herself off a particular medication, Nov. 2016 had foot surgery and ended up with pneumonia. Reported that's when she really started spiraling down. Triggers/Stressors: 1) Housing: Pt resides with three 43 yr olds. Pt's daughter is one of those young adults. "She moved in last August. I hadn't lived with her for six years." They share a room. Pt stated it has been very difficult to live with them because they don't clean up behind themselves. Past psychiatric hospitalizations: three prior (twice in [place] in 2015 and 2017 in [place]. Multiple suicide attempts and a hx of self injurious behaviors (cutting on arms, groin area, and stomach. Recent cut was December 2016. As previously stated in a note; pt doesn't want to continue in [place]; although the plan was for her to complete the MH-IOP before returning to her therapist.  Writer spoke briefly with [name], LCSW, to inform her of patient's plan.  Pt arrived this morning still requesting to be discharged.  Reiterated to pt that if she didn't complete MH-IOP (pt had to only attend this week) she couldn't return to [employer], [facility]. Pt still chose discharge.  "I can't believe my therapist is giving up on me and quitting.  I will just stop being in the  mental health arena.  I will stop taking all my medications and not see a therapist.  You all suppose to be here to help me."  Explained to pt that she is choosing to stop treatment  and the program is here to help her.  Also discouraged pt from following up with a therapist and a medication provider.  Attempted to encourage pt to stick with the program; but she declined.  Pt denies SI/HI or A/V hallucinations.  Attempted to provide her with a list of therapists.  Pt declined the list.  Suggested a referral to [facility]; but pt declined.  Pt walked out of writer's office stating that she is disappointed that she can't see [facility] anymore.  Due to pt's past hx of self-injurious behaviors; Probation officer reiterated safety options if pt needed them in the future as she was leaving.  A:  D/C pt per pt's request.  F/U with Jobie Quaker, PA-C in March 2017.  Mailed pt a list of recommended therapists (DBT).  R:  Pt receptive.     Carlis Abbott, RITA, M.Ed, CNA

## 2015-09-03 ENCOUNTER — Telehealth (HOSPITAL_COMMUNITY): Payer: Self-pay

## 2015-09-03 ENCOUNTER — Other Ambulatory Visit (HOSPITAL_COMMUNITY): Payer: Medicare Other | Admitting: Psychiatry

## 2015-09-03 NOTE — Telephone Encounter (Signed)
D:  Pt phoned and stated that she called her therapist Tharon Aquas) yesterday and left a vm.  Pt is continuously stating that she is in need of closure.  "I want to know why was the condition put in place in the first place.  I wasn't learning anything in the groups and I didn't want to pay $6,000 for nothing.  This has damaged me and my progress in so many ways, Tharon Aquas just doesn't know."  Pt denied SI/HI or A/V hallucinations.  A:  Reiterated to pt that she made her choice with ending [place].  Inquired if pt wanted a referral to a therapist.  Pt declined again.  Will inform [name], LCSW, Dr. Lovena Le and Beather Arbour, RN of phone call.

## 2015-09-03 NOTE — Telephone Encounter (Signed)
D:  Pt phoned and left vm that she feels that she isn't being heard.  Reports that she just spoke to Beather Arbour, Therapist, sports.  States that the phone conversation didn't help.  "I feel that everything is being blamed on me.  Maybe everything is my fault.  The professionals are right and I guess I am wrong."  Pt continued to talk and the vm cut her off.  She didn't call back.  A:  Informed Beather Arbour, RN and [name], LCSW.

## 2015-09-03 NOTE — Progress Notes (Signed)
Patient ID: Morgan [name], female   DOB: 01-04-1966, 50 y.o.   MRN: [id number] Discharge Note  Patient:  Morgan [name] is an 50 y.o., female DOB:  February 15, 1966  Date of Admission:  (Not on file)  Date of Discharge:  08/30/2015  Reason for Admission:depression  IOP Course:  Morgan [name] was not interested in attending IOP from the beginning.  Consequently I did not think group would be useful to her as she has had many years of therapy and group experience.  She was actually very good in group talking about her issues and helping others according to the group therapist.  She attended because her current outpatient therapist insisted she give it a try as nothing else seemed to be helping and it was a condition for returning to therapy after discharge from inpatient.  Morgan [name] chose not to continue IOP and was consequently referred to a new therapist.  She was already seeing a new provider.  Mental Status at Discharge:remains depressed, upset that she has to see a new therapist but not expressing suicidal thoughts  Lab Results: No results found for this or any previous visit (from the past 51 hour(s)).   Current outpatient prescriptions:  .  albuterol (PROVENTIL HFA;VENTOLIN HFA) 108 (90 Base) MCG/ACT inhaler, Inhale into the lungs every 6 (six) hours as needed for wheezing or shortness of breath., Disp: , Rfl:  .  doxepin (SINEQUAN) 25 MG capsule, Take 1 capsule (25 mg total) by mouth at bedtime as needed., Disp: 30 capsule, Rfl: 0 .  hydrOXYzine (ATARAX/VISTARIL) 25 MG tablet, Take 1 tablet (25 mg total) by mouth 3 (three) times daily as needed for anxiety., Disp: 30 tablet, Rfl: 0 .  hyoscyamine (LEVSIN, ANASPAZ) 0.125 MG tablet, Take 0.125 mg by mouth as needed for cramping. , Disp: , Rfl:  .  levothyroxine (SYNTHROID, LEVOTHROID) 50 MCG tablet, Take 1 tablet (50 mcg total) by mouth daily., Disp: 90 tablet, Rfl: 3 .  lithium carbonate 150 MG capsule, Take 1 capsule (150 mg total) by mouth 3 (three)  times daily with meals., Disp: 90 capsule, Rfl: 0  Axis Diagnosis:  Bipolar 1, currently depressed and borderline personality disorder   Level of Care:  IOP  Discharge destination:  Other:  has appointment with Eino Farber at [facility] for med management and a list of potential therapists  Is patient on multiple antipsychotic therapies at discharge:  No    Has Patient had three or more failed trials of antipsychotic monotherapy by history:  Negative  Patient phone:  [phone] (home)  Patient address:   [address] [place] [address],   Follow-up recommendations:  Activity:  continue current activity Diet:  continue current diet  Comments:  none  The patient received suicide prevention pamphlet:  Yes  Donnelly Angelica 09/03/2015, 9:25 AM

## 2015-09-03 NOTE — Telephone Encounter (Signed)
Met with [name], therapist and Dellia Nims, case manager for IOP to discuss patient's ending of IOP prior to completing the program and being explained to on 09/02/15 expectation patient complete the Intensive Outpatient Program before returning to individual therapy with [name].  Informed the recommendations were laid out to patient while inpatient due to patient's recent discharge from inpatient services and increased emergent incidents with trips to the ED and overuse of substances and medications over the past 2 months.  At first patient stated she did not feel IOP was helping her and was not worth the cost of $600 a day as she did not think they were effectively teaching new skills.  Patient stated she felt often she was helping the people in the group but stated she was "not getting anything out of it" and fest she was getting worse.  Discussed with patient her clear discussion to remind her of the plan laid out for her to complete the program before returning to individual therapy due to current needed higher level of care and patient admittedly stating understanding if she stopped the program she would be closed out for services as she had also recently moved to a new psychiatrist.  Patient stated "I'm not going to see anyone" and "I can do it on my own".  Stated she had taken care of herself in the past without needing behavioral health services and wanted to try this at this time.  Discussed referrals and recommendations for needed DBT skills groups.  Patient stated the only one in town was through [facility] psychology clinic but reports they currently do not take her insurance and encouraged her to follow up with [facility] for other possible DBT skills programs.  Encouraged patient to follow up with her new psychiatrist as patient stated she did not think this currently necessary and stated she was fine at this time with no admission of concerns for safety for herself or others.  Patient  stated she was just upset [name], therapist would not be taking her back but stated understanding decision.  Patient then stated "she barely remembered having a conversation on 09/02/15 that she would be discharged from services if she did not complete IOP and recommendations as she had previously acknowledged had been clear about what would occur if she did not complete IOP.  Again encouraged patient to use higher level of care recommendations and information we were mailing patient to seek out DBT services and to follow up with her new psychiatrist for any medication concerns as they had already taken over patient's care.  Patient stated she would probably not use any recommendations sent but understood why services were ended at this time. Patient with no suicidal or homicidal ideations expressed at this time, stated she was "fine' multiple times and reported she had a plan to continue to keep herself safe and to treat her own symptoms.

## 2015-09-04 ENCOUNTER — Other Ambulatory Visit (HOSPITAL_COMMUNITY): Payer: Medicare Other

## 2015-09-05 ENCOUNTER — Other Ambulatory Visit (HOSPITAL_COMMUNITY): Payer: Medicare Other

## 2015-09-05 ENCOUNTER — Ambulatory Visit (HOSPITAL_COMMUNITY): Payer: Self-pay | Admitting: Clinical

## 2015-09-06 ENCOUNTER — Other Ambulatory Visit (HOSPITAL_COMMUNITY): Payer: Medicare Other

## 2015-09-09 ENCOUNTER — Other Ambulatory Visit (HOSPITAL_COMMUNITY): Payer: Medicare Other

## 2015-09-10 ENCOUNTER — Telehealth: Payer: Self-pay | Admitting: Gastroenterology

## 2015-09-10 ENCOUNTER — Other Ambulatory Visit (HOSPITAL_COMMUNITY): Payer: Medicare Other

## 2015-09-11 ENCOUNTER — Ambulatory Visit (HOSPITAL_COMMUNITY): Payer: Self-pay | Admitting: Clinical

## 2015-09-11 ENCOUNTER — Other Ambulatory Visit (HOSPITAL_COMMUNITY): Payer: Medicare Other

## 2015-09-11 NOTE — Telephone Encounter (Signed)
Pt complains of diarrhea, reflux she has been scheduled for endo colon and will keep that appt.  She also states she has joint pain and has an appt with her PCP tomorrow.  She will call if her PCP recommends sooner appt.

## 2015-09-12 ENCOUNTER — Other Ambulatory Visit (INDEPENDENT_AMBULATORY_CARE_PROVIDER_SITE_OTHER): Payer: Medicare Other

## 2015-09-12 ENCOUNTER — Encounter: Payer: Self-pay | Admitting: Internal Medicine

## 2015-09-12 ENCOUNTER — Ambulatory Visit (INDEPENDENT_AMBULATORY_CARE_PROVIDER_SITE_OTHER): Payer: Medicare Other | Admitting: Internal Medicine

## 2015-09-12 ENCOUNTER — Ambulatory Visit (HOSPITAL_COMMUNITY): Payer: Self-pay | Admitting: Psychiatry

## 2015-09-12 ENCOUNTER — Telehealth: Payer: Self-pay | Admitting: Gastroenterology

## 2015-09-12 ENCOUNTER — Other Ambulatory Visit (HOSPITAL_COMMUNITY): Payer: Medicare Other

## 2015-09-12 VITALS — BP 130/80 | HR 76 | Temp 98.1°F | Resp 18 | Wt 168.0 lb

## 2015-09-12 DIAGNOSIS — M545 Low back pain, unspecified: Secondary | ICD-10-CM | POA: Insufficient documentation

## 2015-09-12 DIAGNOSIS — R197 Diarrhea, unspecified: Secondary | ICD-10-CM

## 2015-09-12 DIAGNOSIS — M2550 Pain in unspecified joint: Secondary | ICD-10-CM

## 2015-09-12 DIAGNOSIS — E038 Other specified hypothyroidism: Secondary | ICD-10-CM | POA: Diagnosis not present

## 2015-09-12 DIAGNOSIS — G8929 Other chronic pain: Secondary | ICD-10-CM | POA: Diagnosis not present

## 2015-09-12 LAB — COMPREHENSIVE METABOLIC PANEL
ALBUMIN: 4.4 g/dL (ref 3.5–5.2)
ALT: 11 U/L (ref 0–35)
AST: 14 U/L (ref 0–37)
Alkaline Phosphatase: 56 U/L (ref 39–117)
BILIRUBIN TOTAL: 0.3 mg/dL (ref 0.2–1.2)
BUN: 9 mg/dL (ref 6–23)
CALCIUM: 10.1 mg/dL (ref 8.4–10.5)
CO2: 30 meq/L (ref 19–32)
CREATININE: 0.81 mg/dL (ref 0.40–1.20)
Chloride: 106 mEq/L (ref 96–112)
GFR: 79.32 mL/min (ref >60.00)
Glucose, Bld: 82 mg/dL (ref 70–99)
Potassium: 3.4 mEq/L — ABNORMAL LOW (ref 3.5–5.1)
Sodium: 142 mEq/L (ref 135–145)
Total Protein: 7.2 g/dL (ref 6.0–8.3)

## 2015-09-12 LAB — CBC WITH DIFFERENTIAL/PLATELET
BASOS ABS: 0.1 10*3/uL (ref 0.0–0.1)
BASOS PCT: 0.5 % (ref 0.0–3.0)
EOS ABS: 4.6 10*3/uL — AB (ref 0.0–0.7)
Eosinophils Relative: 28.8 % — ABNORMAL HIGH (ref 0.0–5.0)
HEMATOCRIT: 43.2 % (ref 36.0–46.0)
HEMOGLOBIN: 14.4 g/dL (ref 12.0–15.0)
LYMPHS PCT: 39.3 % (ref 12.0–46.0)
Lymphs Abs: 6.3 10*3/uL — ABNORMAL HIGH (ref 0.7–4.0)
MCHC: 33.3 g/dL (ref 30.0–36.0)
MCV: 85.5 fl (ref 78.0–100.0)
MONOS PCT: 5 % (ref 3.0–12.0)
Monocytes Absolute: 0.8 10*3/uL (ref 0.1–1.0)
NEUTROS ABS: 4.2 10*3/uL (ref 1.4–7.7)
Neutrophils Relative %: 26.4 % — ABNORMAL LOW (ref 43.0–77.0)
PLATELETS: 267 10*3/uL (ref 150.0–400.0)
RBC: 5.05 Mil/uL (ref 3.87–5.11)
RDW: 14.4 % (ref 11.5–15.5)
WBC: 16.1 10*3/uL — AB (ref 4.0–10.5)

## 2015-09-12 LAB — C-REACTIVE PROTEIN: CRP: 0.5 mg/dL (ref 0.5–20.0)

## 2015-09-12 LAB — SEDIMENTATION RATE: SED RATE: 10 mm/h (ref 0–22)

## 2015-09-12 LAB — RHEUMATOID FACTOR

## 2015-09-12 MED ORDER — NA SULFATE-K SULFATE-MG SULF 17.5-3.13-1.6 GM/177ML PO SOLN: 1.0000 | Freq: Once | ORAL | Status: DC

## 2015-09-12 NOTE — Assessment & Plan Note (Signed)
Is not taking the medication because she is off the lithium  Will recheck tfts

## 2015-09-12 NOTE — Progress Notes (Signed)
Subjective:    Patient ID: Morgan [name], female    DOB: Jan 02, 1966, 50 y.o.   MRN: [id number]  HPI She is here for an acute visit for back pain and carpal tunnel pain.  Back pain:  She has chronic lower back pain.  It started in 2007 after a fall.  She does have DDD.  The pain is in the center of the lower back and when more severe it radiates to the hips.  Walking helps the pain, but it has not helped recently.  She has had sciatica like pain in the past, but not recently.  She has done PT in the past.    Joint pain: She gets episodes of pain throughout body and joints - this has been going on for a while.  Her shoulders, wrists, hands, knees, hips and feet are painful.  She also states associated fatigue.  She has been checked for fibromyalgia and RA in the past.  She typically rests and the episodes typically only last a couple of days.  This current episode has lasted longer.  She is unsure if she gets joint swelling.  She does get some muscle pain in her upper legs and upper arms.  She is also getting headaches that is not relieved with otc medication. The headaches cause difficulty sleeping.  She has chronic intermittent rashes that she thinks is related to stress and some foods.  She tries to watch what she eats.    Diarrhea:  She has been having diarrhea and abdominal cramping.  There was evidence of blood in her stool, but it was not gross blood.  She is scheduled for a colonoscopy next month.   Medications and allergies reviewed with patient and updated if appropriate.  Patient Active Problem List   Diagnosis Date Noted  . Bipolar 1 disorder, depressed ([place]) 08/27/2015    Class: Chronic  . Other specified hypothyroidism 08/23/2015  . Borderline personality disorder 08/15/2015  . Severe benzodiazepine use disorder 08/15/2015  . Alcohol use disorder, moderate, dependence ([place]) 08/15/2015  . PTSD (post-traumatic stress disorder) 08/15/2015  . History of bipolar disorder 08/15/2015   . Bronchopneumonia 07/10/2015  . Cough 06/19/2015  . Pre-syncope 05/17/2015  . Skull deformity 05/17/2015  . Headache 05/17/2015  . Patellofemoral syndrome of both knees 05/02/2015  . Knee pain, bilateral 03/05/2015    Current Outpatient Prescriptions on File Prior to Visit  Medication Sig Dispense Refill  . albuterol (PROVENTIL HFA;VENTOLIN HFA) 108 (90 Base) MCG/ACT inhaler Inhale into the lungs every 6 (six) hours as needed for wheezing or shortness of breath.    . doxepin (SINEQUAN) 25 MG capsule Take 1 capsule (25 mg total) by mouth at bedtime as needed. 30 capsule 0  . hydrOXYzine (ATARAX/VISTARIL) 25 MG tablet Take 1 tablet (25 mg total) by mouth 3 (three) times daily as needed for anxiety. 30 tablet 0  . hyoscyamine (LEVSIN, ANASPAZ) 0.125 MG tablet Take 0.125 mg by mouth as needed for cramping.     Marland Kitchen levothyroxine (SYNTHROID, LEVOTHROID) 50 MCG tablet Take 1 tablet (50 mcg total) by mouth daily. 90 tablet 3  . lithium carbonate 150 MG capsule Take 1 capsule (150 mg total) by mouth 3 (three) times daily with meals. 90 capsule 0   No current facility-administered medications on file prior to visit.    Past Medical History  Diagnosis Date  . Asthma   . IBS (irritable bowel syndrome) 1995  . Arthritis   . Degenerative disorder of bone   .  Bulging lumbar disc 07/19/05  . Bipolar disorder ([place])   . History of borderline personality disorder   . Anxiety   . Hypothyroidism 2007    Developed after use of Lithium  . Chronic headaches   . Colon polyps   . Proctitis   . Depression   . Pneumonia     Past Surgical History  Procedure Laterality Date  . Cholecystectomy    . Tubal ligation    . Abdominal hysterectomy    . Shoulder open rotator cuff repair Right 03/2010  . Bunionectomy Right 06/2012  . Appendectomy      Social History   Social History  . Marital Status: Divorced    Spouse Name: N/A  . Number of Children: 2  . Years of Education: 14   Occupational  History  . Unemployed    Social History Main Topics  . Smoking status: Former Smoker    Quit date: 07/20/1980  . Smokeless tobacco: Never Used  . Alcohol Use: 0.0 oz/week    0 Standard drinks or equivalent per week     Comment: occ  . Drug Use: Yes    Special: Benzodiazepines  . Sexual Activity: No     Comment: intercourse age unknown,sexual partners less than 5   Other Topics Concern  . None   Social History Narrative   Fun: [place], travel, music, writing, walking, hiking, volunteering   Denies religious beliefs effecting health care.    Feels safe at home.     Family History  Problem Relation Age of Onset  . Depression Mother   . Hypertension Mother   . Post-traumatic stress disorder Sister   . Alcohol abuse Brother   . Drug abuse Brother   . Post-traumatic stress disorder Brother   . Thyroid disease Father   . Alzheimer's disease Father   . COPD Maternal Grandmother   . Heart disease Maternal Grandfather   . Arthritis Paternal Grandmother   . Diabetes Paternal Grandmother   . Depression Paternal Grandmother   . Alzheimer's disease Paternal Grandmother   . Heart disease Paternal Grandfather   . Coronary artery disease Paternal Grandfather   . Alcohol abuse Paternal Grandfather     Review of Systems  Constitutional: Positive for fever.  Respiratory: Positive for cough, shortness of breath and wheezing.   Cardiovascular: Positive for chest pain, palpitations and leg swelling (mild, ankles and feet).  Gastrointestinal: Positive for abdominal pain (pain and cramping), diarrhea and blood in stool (hemocult only, no visualized blood).  Musculoskeletal: Positive for back pain and arthralgias. Negative for joint swelling.  Skin: Positive for rash (related to food or anxiety often).  Neurological: Positive for headaches. Negative for numbness (now, but has had in the past).       Objective:   Filed Vitals:   09/12/15 0925  BP: 130/80  Pulse: 76  Temp: 98.1 F  (36.7 C)  Resp: 18   Filed Weights   09/12/15 0925  Weight: 168 lb (76.204 kg)   Body mass index is 29.29 kg/(m^2).   Physical Exam  Constitutional: She appears well-developed and well-nourished. No distress.  HENT:  Head: Normocephalic and atraumatic.  Eyes: Conjunctivae are normal.  Neck: Normal range of motion. Neck supple. No thyromegaly present.  Cardiovascular: Normal rate, regular rhythm and normal heart sounds.   No murmur heard. Pulmonary/Chest: Effort normal and breath sounds normal. No respiratory distress. She has no wheezes. She has no rales.  Abdominal: She exhibits no distension. There is tenderness (across upper  abdomen). There is no rebound and no guarding.  Musculoskeletal: She exhibits no edema.  Tenderness with palpation in lower back and upper thoracic spine, no deformities  Neurological:  Normal sensation in legs, normal strength  Skin: No rash noted. She is not diaphoretic.  Psychiatric: She has a normal mood and affect. Her behavior is normal.       Assessment & Plan:   See Problem List for Assessment and Plan of chronic medical problems.   Follow up as needed Follow up as needed

## 2015-09-12 NOTE — Assessment & Plan Note (Signed)
Has colonoscopy scheduled Will check celiac panel and cbc, cmp

## 2015-09-12 NOTE — Patient Instructions (Addendum)
  We have reviewed your prior records including labs and tests today.  Test(s) ordered today. Your results will be released to [facility] (or called to you) after review, usually within 72hours after test completion. If any changes need to be made, you will be notified at that same time.  All other Health Maintenance issues reviewed.   All recommended immunizations and age-appropriate screenings are up-to-date.  No immunizations administered today. You should have a mammogram.   Medications reviewed and updated.  No changes recommended at this time.   A referral was ordered for orthopedics  Please followup as needed

## 2015-09-12 NOTE — Assessment & Plan Note (Signed)
Episodic and diffuse Autoimmune blood work has been checked in the past, but we will recheck today since she is currently symptomatic Also will check celiac screen given other symptoms otc meds for symptomatic relief only if needed  -- she knows not to overdue the nsaids

## 2015-09-12 NOTE — Progress Notes (Signed)
Pre visit review using our clinic review tool, if applicable. No additional management support is needed unless otherwise documented below in the visit note. 

## 2015-09-12 NOTE — Assessment & Plan Note (Signed)
Will refer to orthopedics Walking helps and she needs to walk regularly, but that has been limited due to other problems. Her pain is also worse and will let ortho re-evaluate  Would likely benefit from additional PT

## 2015-09-12 NOTE — Telephone Encounter (Signed)
Prescription has been sent to the pharmacy as requested.   

## 2015-09-13 ENCOUNTER — Other Ambulatory Visit (HOSPITAL_COMMUNITY): Payer: Medicare Other

## 2015-09-13 LAB — ANA: ANA: NEGATIVE

## 2015-09-13 LAB — TISSUE TRANSGLUTAMINASE, IGA: Tissue Transglutaminase Ab, IgA: 1 U/mL (ref <4)

## 2015-09-13 LAB — GLIADIN ANTIBODIES, SERUM
GLIADIN IGA: 2 U (ref <20)
Gliadin IgG: 2 Units (ref <20)

## 2015-09-14 LAB — RETICULIN ANTIBODIES, IGA W TITER: Reticulin Ab, IgA: NEGATIVE

## 2015-09-15 ENCOUNTER — Encounter: Payer: Self-pay | Admitting: Internal Medicine

## 2015-09-16 ENCOUNTER — Other Ambulatory Visit (HOSPITAL_COMMUNITY): Payer: Medicare Other

## 2015-09-17 ENCOUNTER — Other Ambulatory Visit (HOSPITAL_COMMUNITY): Payer: Medicare Other

## 2015-09-19 ENCOUNTER — Ambulatory Visit (HOSPITAL_COMMUNITY): Payer: Self-pay | Admitting: Clinical

## 2015-09-19 ENCOUNTER — Encounter: Payer: Self-pay | Admitting: Family

## 2015-09-19 ENCOUNTER — Ambulatory Visit (INDEPENDENT_AMBULATORY_CARE_PROVIDER_SITE_OTHER): Payer: Medicare Other | Admitting: Family

## 2015-09-19 VITALS — BP 128/74 | HR 79 | Temp 97.9°F | Resp 16 | Ht 63.5 in | Wt 167.0 lb

## 2015-09-19 DIAGNOSIS — R21 Rash and other nonspecific skin eruption: Secondary | ICD-10-CM

## 2015-09-19 MED ORDER — TRIAMCINOLONE ACETONIDE 0.1 % EX CREA: 1.0000 "application " | TOPICAL_CREAM | Freq: Two times a day (BID) | CUTANEOUS | Status: DC

## 2015-09-19 NOTE — Assessment & Plan Note (Signed)
Rash with questionable origin with possibility of insect origin. Start triamcinolone. Follow up if symptoms worsen or fail to improve.

## 2015-09-19 NOTE — Patient Instructions (Addendum)
Thank you for choosing [facility].  Summary/Instructions:  Your prescription(s) have been submitted to your pharmacy or been printed and provided for you. Please take as directed and contact our office if you believe you are having problem(s) with the medication(s) or have any questions.  If your symptoms worsen or fail to improve, please contact our office for further instruction, or in case of emergency go directly to the emergency room at the closest medical facility.    Acute Ankle Sprain With Phase I Rehab An acute ankle sprain is a partial or complete tear in one or more of the ligaments of the ankle due to traumatic injury. The severity of the injury depends on both the number of ligaments sprained and the grade of sprain. There are 3 grades of sprains.   A grade 1 sprain is a mild sprain. There is a slight pull without obvious tearing. There is no loss of strength, and the muscle and ligament are the correct length.  A grade 2 sprain is a moderate sprain. There is tearing of fibers within the substance of the ligament where it connects two bones or two cartilages. The length of the ligament is increased, and there is usually decreased strength.  A grade 3 sprain is a complete rupture of the ligament and is uncommon. In addition to the grade of sprain, there are three types of ankle sprains.  Lateral ankle sprains: This is a sprain of one or more of the three ligaments on the outer side (lateral) of the ankle. These are the most common sprains. Medial ankle sprains: There is one large triangular ligament of the inner side (medial) of the ankle that is susceptible to injury. Medial ankle sprains are less common. Syndesmosis, "high ankle," sprains: The syndesmosis is the ligament that connects the two bones of the lower leg. Syndesmosis sprains usually only occur with very severe ankle sprains. SYMPTOMS  Pain, tenderness, and swelling in the ankle, starting at the side of injury  that may progress to the whole ankle and foot with time.  "Pop" or tearing sensation at the time of injury.  Bruising that may spread to the heel.  Impaired ability to walk soon after injury. CAUSES   Acute ankle sprains are caused by trauma placed on the ankle that temporarily forces or pries the anklebone (talus) out of its normal socket.  Stretching or tearing of the ligaments that normally hold the joint in place (usually due to a twisting injury). RISK INCREASES WITH:  Previous ankle sprain.  Sports in which the foot may land awkwardly (i.e., basketball, volleyball, or soccer) or walking or running on uneven or rough surfaces.  Shoes with inadequate support to prevent sideways motion when stress occurs.  Poor strength and flexibility.  Poor balance skills.  Contact sports. PREVENTION   Warm up and stretch properly before activity.  Maintain physical fitness:  Ankle and leg flexibility, muscle strength, and endurance.  Cardiovascular fitness.  Balance training activities.  Use proper technique and have a coach correct improper technique.  Taping, protective strapping, bracing, or high-top tennis shoes may help prevent injury. Initially, tape is best; however, it loses most of its support function within 10 to 15 minutes.  Wear proper-fitted protective shoes (High-top shoes with taping or bracing is more effective than either alone).  Provide the ankle with support during sports and practice activities for 12 months following injury. PROGNOSIS   If treated properly, ankle sprains can be expected to recover completely; however, the length  of recovery depends on the degree of injury.  A grade 1 sprain usually heals enough in 5 to 7 days to allow modified activity and requires an average of 6 weeks to heal completely.  A grade 2 sprain requires 6 to 10 weeks to heal completely.  A grade 3 sprain requires 12 to 16 weeks to heal.  A syndesmosis sprain often takes  more than 3 months to heal. RELATED COMPLICATIONS   Frequent recurrence of symptoms may result in a chronic problem. Appropriately addressing the problem the first time decreases the frequency of recurrence and optimizes healing time. Severity of the initial sprain does not predict the likelihood of later instability.  Injury to other structures (bone, cartilage, or tendon).  A chronically unstable or arthritic ankle joint is a possibility with repeated sprains. TREATMENT Treatment initially involves the use of ice, medication, and compression bandages to help reduce pain and inflammation. Ankle sprains are usually immobilized in a walking cast or boot to allow for healing. Crutches may be recommended to reduce pressure on the injury. After immobilization, strengthening and stretching exercises may be necessary to regain strength and a full range of motion. Surgery is rarely needed to treat ankle sprains. MEDICATION   Nonsteroidal anti-inflammatory medications, such as aspirin and ibuprofen (do not take for the first 3 days after injury or within 7 days before surgery), or other minor pain relievers, such as acetaminophen, are often recommended. Take these as directed by your caregiver. Contact your caregiver immediately if any bleeding, stomach upset, or signs of an allergic reaction occur from these medications.  Ointments applied to the skin may be helpful.  Pain relievers may be prescribed as necessary by your caregiver. Do not take prescription pain medication for longer than 4 to 7 days. Use only as directed and only as much as you need. HEAT AND COLD  Cold treatment (icing) is used to relieve pain and reduce inflammation for acute and chronic cases. Cold should be applied for 10 to 15 minutes every 2 to 3 hours for inflammation and pain and immediately after any activity that aggravates your symptoms. Use ice packs or an ice massage.  Heat treatment may be used before performing  stretching and strengthening activities prescribed by your caregiver. Use a heat pack or a warm soak. SEEK IMMEDIATE MEDICAL CARE IF:   Pain, swelling, or bruising worsens despite treatment.  You experience pain, numbness, discoloration, or coldness in the foot or toes.  New, unexplained symptoms develop (drugs used in treatment may produce side effects.) EXERCISES  PHASE I EXERCISES RANGE OF MOTION (ROM) AND STRETCHING EXERCISES - Ankle Sprain, Acute Phase I, Weeks 1 to 2 These exercises may help you when beginning to restore flexibility in your ankle. You will likely work on these exercises for the 1 to 2 weeks after your injury. Once your physician, physical therapist, or athletic trainer sees adequate progress, he or she will advance your exercises. While completing these exercises, remember:   Restoring tissue flexibility helps normal motion to return to the joints. This allows healthier, less painful movement and activity.  An effective stretch should be held for at least 30 seconds.  A stretch should never be painful. You should only feel a gentle lengthening or release in the stretched tissue. RANGE OF MOTION - Dorsi/Plantar Flexion  While sitting with your right / left knee straight, draw the top of your foot upwards by flexing your ankle. Then reverse the motion, pointing your toes downward.  Hold each  position for __________ seconds.  After completing your first set of exercises, repeat this exercise with your knee bent. Repeat __________ times. Complete this exercise __________ times per day.  RANGE OF MOTION - Ankle Alphabet  Imagine your right / left big toe is a pen.  Keeping your hip and knee still, write out the entire alphabet with your "pen." Make the letters as large as you can without increasing any discomfort. Repeat __________ times. Complete this exercise __________ times per day.  STRENGTHENING EXERCISES - Ankle Sprain, Acute -Phase I, Weeks 1 to 2 These  exercises may help you when beginning to restore strength in your ankle. You will likely work on these exercises for 1 to 2 weeks after your injury. Once your physician, physical therapist, or athletic trainer sees adequate progress, he or she will advance your exercises. While completing these exercises, remember:   Muscles can gain both the endurance and the strength needed for everyday activities through controlled exercises.  Complete these exercises as instructed by your physician, physical therapist, or athletic trainer. Progress the resistance and repetitions only as guided.  You may experience muscle soreness or fatigue, but the pain or discomfort you are trying to eliminate should never worsen during these exercises. If this pain does worsen, stop and make certain you are following the directions exactly. If the pain is still present after adjustments, discontinue the exercise until you can discuss the trouble with your clinician. STRENGTH - Dorsiflexors  Secure a rubber exercise band/tubing to a fixed object (i.e., table, pole) and loop the other end around your right / left foot.  Sit on the floor facing the fixed object. The band/tubing should be slightly tense when your foot is relaxed.  Slowly draw your foot back toward you using your ankle and toes.  Hold this position for __________ seconds. Slowly release the tension in the band and return your foot to the starting position. Repeat __________ times. Complete this exercise __________ times per day.  STRENGTH - Plantar-flexors   Sit with your right / left leg extended. Holding onto both ends of a rubber exercise band/tubing, loop it around the ball of your foot. Keep a slight tension in the band.  Slowly push your toes away from you, pointing them downward.  Hold this position for __________ seconds. Return slowly, controlling the tension in the band/tubing. Repeat __________ times. Complete this exercise __________ times per  day.  STRENGTH - Ankle Eversion  Secure one end of a rubber exercise band/tubing to a fixed object (table, pole). Loop the other end around your foot just before your toes.  Place your fists between your knees. This will focus your strengthening at your ankle.  Drawing the band/tubing across your opposite foot, slowly, pull your little toe out and up. Make sure the band/tubing is positioned to resist the entire motion.  Hold this position for __________ seconds. Have your muscles resist the band/tubing as it slowly pulls your foot back to the starting position.  Repeat __________ times. Complete this exercise __________ times per day.  STRENGTH - Ankle Inversion  Secure one end of a rubber exercise band/tubing to a fixed object (table, pole). Loop the other end around your foot just before your toes.  Place your fists between your knees. This will focus your strengthening at your ankle.  Slowly, pull your big toe up and in, making sure the band/tubing is positioned to resist the entire motion.  Hold this position for __________ seconds.  Have your muscles  resist the band/tubing as it slowly pulls your foot back to the starting position. Repeat __________ times. Complete this exercises __________ times per day.  STRENGTH - Towel Curls  Sit in a chair positioned on a non-carpeted surface.  Place your right / left foot on a towel, keeping your heel on the floor.  Pull the towel toward your heel by only curling your toes. Keep your heel on the floor.  If instructed by your physician, physical therapist, or athletic trainer, add weight to the end of the towel. Repeat __________ times. Complete this exercise __________ times per day.   This information is not intended to replace advice given to you by your health care provider. Make sure you discuss any questions you have with your health care provider.   Document Released: 02/04/2005 Document Revised: 07/27/2014 Document Reviewed:  10/18/2008 Elsevier Interactive Patient Education [employer].

## 2015-09-19 NOTE — Progress Notes (Signed)
Subjective:    Patient ID: Morgan [name], female    DOB: March 02, 1966, 50 y.o.   MRN: [id number]  Chief Complaint  Patient presents with  . Rash    thinks she got bit by something, has bite marks on legs and feet     HPI:  Morgan [name] is a 50 y.o. female who  has a past medical history of Asthma; IBS (irritable bowel syndrome) (1995); Arthritis; Degenerative disorder of bone; Bulging lumbar disc (07/19/05); Bipolar disorder ([place]); History of borderline personality disorder; Anxiety; Hypothyroidism (2007); Chronic headaches; Colon polyps; Proctitis; Depression; and Pneumonia. and presents today for an acute office visit.  This is a new problem. Associated symptom of a rash located on her legs and feet and wrists, face and chin that has been going on for about 4 days. Modifying factors include Benedryl which did help with her symptoms. Described as red and uncomfortable. Denies fevers. States she washed her sheets    Allergies  Allergen Reactions  . Abilify [Aripiprazole] Swelling    Throat begins to swell   . Ciprofloxacin Shortness Of Breath  . Lamictal [Lamotrigine] Rash  . Amoxicillin Hives  . Doxycycline Hives  . Latex Rash  . Meloxicam Other (See Comments)    Induces mania  . Mirapex [Pramipexole] Hives  . Prednisone Other (See Comments)    Interacts with Lithium. " All steroids"  . Risperdal [Risperidone] Hives  . Tape Rash    "steri strips"  . Cortisone     Exacerbates the mania of her bipolar  . Naproxen     Interacts with lithium.   Marland Kitchen Percocet [Oxycodone-Acetaminophen] Other (See Comments)    Severe dizziness  . Other Rash    Throat swelling and rash to WALNUTS and PINE NUTS  . Peanuts [Peanut Oil] Rash    Throat swelling  . Sulfa Antibiotics Rash     Current Outpatient Prescriptions on File Prior to Visit  Medication Sig Dispense Refill  . albuterol (PROVENTIL HFA;VENTOLIN HFA) 108 (90 Base) MCG/ACT inhaler Inhale into the lungs every 6 (six) hours as  needed for wheezing or shortness of breath.    . hyoscyamine (LEVSIN, ANASPAZ) 0.125 MG tablet Take 0.125 mg by mouth as needed for cramping.     . Na Sulfate-K Sulfate-Mg Sulf SOLN Take 1 kit by mouth once. 354 mL 0   No current facility-administered medications on file prior to visit.    Review of Systems  Constitutional: Negative for fever and chills.  Musculoskeletal: Negative for arthralgias.  Skin: Positive for rash.  Neurological: Negative for weakness.      Objective:    BP 128/74 mmHg  Pulse 79  Temp(Src) 97.9 F (36.6 C) (Oral)  Resp 16  Ht 5' 3.5" (1.613 m)  Wt 167 lb (75.751 kg)  BMI 29.12 kg/m2  SpO2 96% Nursing note and vital signs reviewed.  Physical Exam  Constitutional: She is oriented to person, place, and time. She appears well-developed and well-nourished. No distress.  Cardiovascular: Normal rate, regular rhythm, normal heart sounds and intact distal pulses.   Pulmonary/Chest: Effort normal and breath sounds normal.  Neurological: She is alert and oriented to person, place, and time.  Skin: Skin is warm and dry.  Several small 3-4 cm annular lesions red, clearly defined borders and non-blanchable around lower leg and thigh  Psychiatric: She has a normal mood and affect. Her behavior is normal. Judgment and thought content normal.       Assessment & Plan:   Problem  List Items Addressed This Visit      Musculoskeletal and Integument   Rash and nonspecific skin eruption - Primary    Rash with questionable origin with possibility of insect origin. Start triamcinolone. Follow up if symptoms worsen or fail to improve.       Relevant Medications   triamcinolone cream (KENALOG) 0.1 %

## 2015-09-19 NOTE — Progress Notes (Signed)
Pre visit review using our clinic review tool, if applicable. No additional management support is needed unless otherwise documented below in the visit note. 

## 2015-09-20 ENCOUNTER — Encounter: Payer: Self-pay | Admitting: Gastroenterology

## 2015-09-24 ENCOUNTER — Encounter: Payer: Self-pay | Admitting: Gastroenterology

## 2015-09-24 ENCOUNTER — Ambulatory Visit (AMBULATORY_SURGERY_CENTER): Payer: Medicare Other | Admitting: Gastroenterology

## 2015-09-24 VITALS — BP 113/72 | HR 65 | Temp 97.3°F | Resp 13 | Ht 63.5 in | Wt 163.0 lb

## 2015-09-24 DIAGNOSIS — K2970 Gastritis, unspecified, without bleeding: Secondary | ICD-10-CM

## 2015-09-24 DIAGNOSIS — Z86010 Personal history of colonic polyps: Secondary | ICD-10-CM | POA: Diagnosis not present

## 2015-09-24 DIAGNOSIS — K921 Melena: Secondary | ICD-10-CM

## 2015-09-24 DIAGNOSIS — F319 Bipolar disorder, unspecified: Secondary | ICD-10-CM | POA: Diagnosis not present

## 2015-09-24 DIAGNOSIS — R1310 Dysphagia, unspecified: Secondary | ICD-10-CM | POA: Diagnosis not present

## 2015-09-24 DIAGNOSIS — K2990 Gastroduodenitis, unspecified, without bleeding: Secondary | ICD-10-CM

## 2015-09-24 DIAGNOSIS — E669 Obesity, unspecified: Secondary | ICD-10-CM | POA: Diagnosis not present

## 2015-09-24 DIAGNOSIS — F4310 Post-traumatic stress disorder, unspecified: Secondary | ICD-10-CM | POA: Diagnosis not present

## 2015-09-24 DIAGNOSIS — K2950 Unspecified chronic gastritis without bleeding: Secondary | ICD-10-CM | POA: Diagnosis not present

## 2015-09-24 DIAGNOSIS — J45909 Unspecified asthma, uncomplicated: Secondary | ICD-10-CM | POA: Diagnosis not present

## 2015-09-24 MED ORDER — SODIUM CHLORIDE 0.9 % IV SOLN: 500.0000 mL | INTRAVENOUS | Status: DC

## 2015-09-24 NOTE — Progress Notes (Signed)
Called to room to assist during endoscopic procedure.  Patient ID and intended procedure confirmed with present staff. Received instructions for my participation in the procedure from the performing physician.  

## 2015-09-24 NOTE — Progress Notes (Signed)
Report to PACU, RN, vss, BBS= Clear.  

## 2015-09-24 NOTE — Op Note (Signed)
[facility]  [address] [place], [address]   ENDOSCOPY PROCEDURE REPORT  PATIENT: Morgan [name], Morgan [name]  MR#: [id number] BIRTHDATE: Apr 02, 1966 , 49  yrs. old GENDER: female ENDOSCOPIST: Milus Banister, MD PROCEDURE DATE:  09/24/2015 PROCEDURE:  EGD w/ biopsy ASA CLASS:     Class II INDICATIONS:  post prandial pain, intermittent dysphagia. MEDICATIONS: Residual sedation present, Monitored anesthesia care, and Propofol 100 mg IV TOPICAL ANESTHETIC: none  DESCRIPTION OF PROCEDURE: After the risks benefits and alternatives of the procedure were thoroughly explained, informed consent was obtained.  The LB [id number] endoscope was introduced through the mouth and advanced to the second portion of the duodenum , Without limitations.  The instrument was slowly withdrawn as the mucosa was fully examined.  There was mild, non-specific distal gastritis.  The stomach was biopsied (antrum and body) and sent to pathology.  The examination was otherwise normal.  Retroflexed views revealed no abnormalities. The scope was then withdrawn from the patient and the procedure completed. COMPLICATIONS: There were no immediate complications.  ENDOSCOPIC IMPRESSION: There was mild, non-specific distal gastritis.  The stomach was biopsied (antrum and body) and sent to pathology.  The examination was otherwise normal  RECOMMENDATIONS: Await biopsy results   eSigned:  Milus Banister, MD 2015-09-24 [id number]

## 2015-09-24 NOTE — Patient Instructions (Signed)

## 2015-09-24 NOTE — Op Note (Signed)
[place]  [address]. [place], [address]   COLONOSCOPY PROCEDURE REPORT  PATIENT: Morgan [name], Morgan [name]  MR#: [id number] BIRTHDATE: Aug 28, 1965 , 49  yrs. old GENDER: female ENDOSCOPIST: Milus Banister, MD PROCEDURE DATE:  09/24/2015 PROCEDURE:   Colonoscopy, diagnostic First Screening Colonoscopy - Avg.  risk and is 50 yrs.  old or older - No.  Prior Negative Screening - Now for repeat screening. N/A  History of Adenoma - Now for follow-up colonoscopy & has been > or = to 3 yrs.  Yes hx of adenoma.  Has been 3 or more years since last colonoscopy.  Recommend repeat exam, <10 yrs? No ASA CLASS:   Class II INDICATIONS:Colonoscopy April 2013, Dr.  Trilby Drummer in [facility], indications history of ulcerative proctitis, anal or rectal pain.  He noted a 4 mm polyp in the transverse colon that was removed.  This was proven to be a sessile serrated adenoma on pathology.  He noted scarring in the rectum compatible with an active ulcerative proctitis.  Otherwise normal mucosa throughout the colon.  Biopsies were taken randomly.  He noted internal hemorrhoids.  Random biopsies were normal. Recent FOBT + stool. MEDICATIONS: Monitored anesthesia care and Propofol 200 mg IV  DESCRIPTION OF PROCEDURE:   After the risks benefits and alternatives of the procedure were thoroughly explained, informed consent was obtained.  The digital rectal exam revealed no abnormalities of the rectum.   The [id number]  endoscope was introduced through the anus and advanced to the cecum, which was identified by both the appendix and ileocecal valve. No adverse events experienced.   The quality of the prep was excellent.  The instrument was then slowly withdrawn as the colon was fully examined. Estimated blood loss is zero unless otherwise noted in this procedure report.   COLON FINDINGS: A normal appearing cecum, ileocecal valve, and appendiceal orifice were identified.   The ascending, transverse, descending, sigmoid colon, and rectum appeared unremarkable. Retroflexed views revealed no abnormalities. The time to cecum = 3.1 Withdrawal time = 5.9   The scope was withdrawn and the procedure completed. COMPLICATIONS: There were no immediate complications.  ENDOSCOPIC IMPRESSION: Normal colonoscopy No polyps or cancers  RECOMMENDATIONS: You should continue to follow colorectal cancer screening guidelines for "routine risk" patients with a repeat colonoscopy in 10 years.   eSigned:  Milus Banister, MD 09/24/2015 8:05 AM

## 2015-09-25 ENCOUNTER — Telehealth: Payer: Self-pay

## 2015-09-25 ENCOUNTER — Telehealth: Payer: Self-pay | Admitting: Internal Medicine

## 2015-09-25 MED ORDER — POLYMYXIN B-TRIMETHOPRIM 10000-0.1 UNIT/ML-% OP SOLN: 1.0000 [drp] | OPHTHALMIC | Status: DC

## 2015-09-25 NOTE — Telephone Encounter (Signed)
Spoke with pt to inform.  

## 2015-09-25 NOTE — Telephone Encounter (Signed)
Please advise 

## 2015-09-25 NOTE — Telephone Encounter (Signed)
Verified pharmacy is [facility] on [address].   Patient called to advise that her daughter has pink eye. She woke up with pink eye this morning and is hoping that we could call some drops in for her to pharmacy listed.

## 2015-09-25 NOTE — Telephone Encounter (Signed)
  Follow up Call-  Call back number 09/24/2015  Post procedure Call Back phone  # [phone]  Permission to leave phone message Yes     Patient questions:  Do you have a fever, pain , or abdominal swelling? No. Pain Score  0 *  Have you tolerated food without any problems? Yes.    Have you been able to return to your normal activities? Yes.    Do you have any questions about your discharge instructions: Diet   No. Medications  No. Follow up visit  No.  Do you have questions or concerns about your Care? No.  Actions: * If pain score is 4 or above: No action needed, pain <4.

## 2015-09-25 NOTE — Telephone Encounter (Signed)
Sent - use on affected eye

## 2015-09-26 ENCOUNTER — Ambulatory Visit (HOSPITAL_COMMUNITY): Payer: Self-pay | Admitting: Clinical

## 2015-09-27 ENCOUNTER — Other Ambulatory Visit (INDEPENDENT_AMBULATORY_CARE_PROVIDER_SITE_OTHER): Payer: Medicare Other

## 2015-09-27 DIAGNOSIS — E038 Other specified hypothyroidism: Secondary | ICD-10-CM | POA: Diagnosis not present

## 2015-09-27 LAB — T3, FREE: T3 FREE: 3.1 pg/mL (ref 2.3–4.2)

## 2015-09-27 LAB — T4, FREE: Free T4: 0.7 ng/dL (ref 0.60–1.60)

## 2015-09-27 LAB — TSH: TSH: 6.73 u[IU]/mL — ABNORMAL HIGH (ref 0.35–4.50)

## 2015-09-29 ENCOUNTER — Other Ambulatory Visit: Payer: Self-pay | Admitting: Internal Medicine

## 2015-09-29 ENCOUNTER — Encounter: Payer: Self-pay | Admitting: Internal Medicine

## 2015-09-29 DIAGNOSIS — E038 Other specified hypothyroidism: Secondary | ICD-10-CM

## 2015-09-30 ENCOUNTER — Telehealth: Payer: Self-pay | Admitting: Gastroenterology

## 2015-09-30 DIAGNOSIS — M545 Low back pain: Secondary | ICD-10-CM | POA: Diagnosis not present

## 2015-10-01 NOTE — Telephone Encounter (Signed)
Returned [employer] call at [facility].  She had already had her questions addressed.

## 2015-10-04 ENCOUNTER — Encounter: Payer: Self-pay | Admitting: Gastroenterology

## 2015-10-11 ENCOUNTER — Encounter: Payer: Self-pay | Admitting: Internal Medicine

## 2015-10-11 ENCOUNTER — Ambulatory Visit (INDEPENDENT_AMBULATORY_CARE_PROVIDER_SITE_OTHER): Payer: Medicare Other | Admitting: Internal Medicine

## 2015-10-11 VITALS — BP 116/78 | HR 76 | Temp 97.8°F | Resp 16 | Wt 168.0 lb

## 2015-10-11 DIAGNOSIS — D72829 Elevated white blood cell count, unspecified: Secondary | ICD-10-CM

## 2015-10-11 DIAGNOSIS — M25579 Pain in unspecified ankle and joints of unspecified foot: Secondary | ICD-10-CM | POA: Diagnosis not present

## 2015-10-11 NOTE — Progress Notes (Signed)
Subjective:    Patient ID: Morgan [name], female    DOB: [birth year], 50 y.o.   MRN: [id number]  HPI She is here for her right ankle pain.   She fell down the stairs 1/23. She went to the ED the following day.  The xrays were normal and she was diagnosed with an ankle sprain.   She is wearing her brace, icing it and doing her exercises.  She feels it is not getting better.  The brace hurts at times.  She will still move the ankle a certain way and it will hurt.  The pain is located on the medial malleolus and the achilles tendon.  She has popping when she walks sometimes - she thinks it is in the ankle.  The more active she is the more pain she has.  The ankle still swells.  She knows it sometimes takes a long time for this to heal, but has not seen any improvement and is concerned.  She also experiences chronic left foot pain and swelling since she had surgery last year.   Medications and allergies reviewed with patient and updated if appropriate.  Patient Active Problem List   Diagnosis Date Noted  . Rash and nonspecific skin eruption 09/19/2015  . Chronic lower back pain 09/12/2015  . Diarrhea 09/12/2015  . Joint pain 09/12/2015  . Bipolar 1 disorder, depressed ([place]) 08/27/2015    Class: Chronic  . Other specified hypothyroidism 08/23/2015  . Borderline personality disorder 08/15/2015  . Severe benzodiazepine use disorder 08/15/2015  . Alcohol use disorder, moderate, dependence ([place]) 08/15/2015  . PTSD (post-traumatic stress disorder) 08/15/2015  . History of bipolar disorder 08/15/2015  . Bronchopneumonia 07/10/2015  . Cough 06/19/2015  . Pre-syncope 05/17/2015  . Skull deformity 05/17/2015  . Headache 05/17/2015  . Patellofemoral syndrome of both knees 05/02/2015  . Knee pain, bilateral 03/05/2015    Current Outpatient Prescriptions on File Prior to Visit  Medication Sig Dispense Refill  . albuterol (PROVENTIL HFA;VENTOLIN HFA) 108 (90 Base) MCG/ACT inhaler Inhale into  the lungs every 6 (six) hours as needed for wheezing or shortness of breath.    . doxepin (SINEQUAN) 25 MG capsule take 1 capsule by mouth at bedtime if needed  0  . hydrOXYzine (ATARAX/VISTARIL) 25 MG tablet take 1 tablet by mouth three times a day if needed for anxiety  0  . hyoscyamine (LEVSIN, ANASPAZ) 0.125 MG tablet Take 0.125 mg by mouth as needed for cramping. Reported on 09/24/2015    . LORazepam (ATIVAN) 0.5 MG tablet   0  . triamcinolone cream (KENALOG) 0.1 % Apply 1 application topically 2 (two) times daily. 30 g 0  . trimethoprim-polymyxin b (POLYTRIM) ophthalmic solution Place 1 drop into the left eye every 4 (four) hours. Use for 7 days 10 mL 0   No current facility-administered medications on file prior to visit.    Past Medical History  Diagnosis Date  . Asthma   . IBS (irritable bowel syndrome) 1995  . Arthritis   . Degenerative disorder of bone   . Bulging lumbar disc 07/19/05  . Bipolar disorder ([place])   . History of borderline personality disorder   . Anxiety   . Hypothyroidism 2007    Developed after use of Lithium  . Chronic headaches   . Colon polyps   . Proctitis   . Depression   . Pneumonia   . Substance abuse     ativan last month    Past Surgical History  Procedure Laterality Date  . Cholecystectomy    . Tubal ligation    . Abdominal hysterectomy    . Shoulder open rotator cuff repair Right 03/2010  . Bunionectomy Right 06/2012  . Appendectomy      Social History   Social History  . Marital Status: Divorced    Spouse Name: N/A  . Number of Children: 2  . Years of Education: 14   Occupational History  . Unemployed    Social History Main Topics  . Smoking status: Former Smoker    Quit date: 07/20/1980  . Smokeless tobacco: Never Used  . Alcohol Use: 0.0 oz/week    0 Standard drinks or equivalent per week     Comment: unsure  . Drug Use: Yes    Special: Benzodiazepines  . Sexual Activity: No     Comment: intercourse age  unknown,sexual partners less than 5   Other Topics Concern  . Not on file   Social History Narrative   Fun: [place], travel, music, writing, walking, hiking, volunteering   Denies religious beliefs effecting health care.    Feels safe at home.     Family History  Problem Relation Age of Onset  . Depression Mother   . Hypertension Mother   . Post-traumatic stress disorder Sister   . Alcohol abuse Brother   . Drug abuse Brother   . Post-traumatic stress disorder Brother   . Thyroid disease Father   . Alzheimer's disease Father   . COPD Maternal Grandmother   . Heart disease Maternal Grandfather   . Arthritis Paternal Grandmother   . Diabetes Paternal Grandmother   . Depression Paternal Grandmother   . Alzheimer's disease Paternal Grandmother   . Heart disease Paternal Grandfather   . Coronary artery disease Paternal Grandfather   . Alcohol abuse Paternal Grandfather   . Colon cancer Neg Hx     Review of Systems  Musculoskeletal: Negative for joint swelling.       She has FROM of ankle and toes  Neurological: Negative for weakness and numbness.       Objective:   Filed Vitals:   10/11/15 1127  BP: 116/78  Pulse: 76  Temp: 97.8 F (36.6 C)  Resp: 16   Filed Weights   10/11/15 1127  Weight: 168 lb (76.204 kg)   Body mass index is 29.29 kg/(m^2).   Physical Exam  Gen.: No acute distress Musculoskeletal: Right ankle and foot: Mild swelling left malleolus, no tenderness left malleolus, mild tenderness posterior malleolus on left; no Achilles tenderness with palpation or movement of the ankle, full range of motion of ankle and toes, tenderness medial malleolus and posteriorly to the malleolus, increased pain with inversion/eversion of the ankle, normal sensation and strength of foot and ankle, no redness or skin change, normal DP pulse       Assessment & Plan:   Bilateral foot/ankle pain Right ankle pain and swelling is related to a sprain 2 months ago. X-ray  showed no fracture, but that does not seem to be improvement Her chronic pain in the left foot secondary to surgery last year We'll refer to orthopedics for further evaluation and treatment Continue icing, using a brace and exercises

## 2015-10-11 NOTE — Progress Notes (Signed)
Pre visit review using our clinic review tool, if applicable. No additional management support is needed unless otherwise documented below in the visit note. 

## 2015-10-11 NOTE — Patient Instructions (Signed)
  A referral was ordered for orthopedics to evaluate both feet.

## 2015-10-14 ENCOUNTER — Ambulatory Visit: Payer: Self-pay | Admitting: Internal Medicine

## 2015-10-22 ENCOUNTER — Ambulatory Visit (INDEPENDENT_AMBULATORY_CARE_PROVIDER_SITE_OTHER): Payer: Medicare Other

## 2015-10-22 VITALS — BP 118/70 | Ht 63.5 in | Wt 167.4 lb

## 2015-10-22 DIAGNOSIS — Z0000 Encounter for general adult medical examination without abnormal findings: Secondary | ICD-10-CM | POA: Diagnosis not present

## 2015-10-22 NOTE — Patient Instructions (Addendum)
Morgan [name] , Thank you for taking time to come for your Medicare Wellness Visit. I appreciate your ongoing commitment to your health goals. Please review the following plan we discussed and let me know if I can assist you in the future.   Will have eye exam this year  Will have mammogram this year  Will bring Advanced Directive to the office when feasible  [facility] will find mental health counselor;   These are the goals we discussed: Goals    . patient     Goal is to be independent with job  To continue your health objectives; diet and exercise        This is a list of the screening recommended for you and due dates:  Health Maintenance  Topic Date Due  . Mammogram  12/27/2014  . Flu Shot  02/18/2016  . Pap Smear  03/07/2018  . Tetanus Vaccine  04/14/2025  . Colon Cancer Screening  09/23/2025  . HIV Screening  Completed     Health Maintenance, Female Adopting a healthy lifestyle and getting preventive care can go a long way to promote health and wellness. Talk with your health care provider about what schedule of regular examinations is right for you. This is a good chance for you to check in with your provider about disease prevention and staying healthy. In between checkups, there are plenty of things you can do on your own. Experts have done a lot of research about which lifestyle changes and preventive measures are most likely to keep you healthy. Ask your health care provider for more information. WEIGHT AND DIET  Eat a healthy diet  Be sure to include plenty of vegetables, fruits, low-fat dairy products, and lean protein.  Do not eat a lot of foods high in solid fats, added sugars, or salt.  Get regular exercise. This is one of the most important things you can do for your health.  Most adults should exercise for at least 150 minutes each week. The exercise should increase your heart rate and make you sweat (moderate-intensity exercise).  Most adults should also do  strengthening exercises at least twice a week. This is in addition to the moderate-intensity exercise.  Maintain a healthy weight  Body mass index (BMI) is a measurement that can be used to identify possible weight problems. It estimates body fat based on height and weight. Your health care provider can help determine your BMI and help you achieve or maintain a healthy weight.  For females 26 years of age and older:   A BMI below 18.5 is considered underweight.  A BMI of 18.5 to 24.9 is normal.  A BMI of 25 to 29.9 is considered overweight.  A BMI of 30 and above is considered obese.  Watch levels of cholesterol and blood lipids  You should start having your blood tested for lipids and cholesterol at 51 years of age, then have this test every 5 years.  You may need to have your cholesterol levels checked more often if:  Your lipid or cholesterol levels are high.  You are older than 51 years of age.  You are at high risk for heart disease.  CANCER SCREENING   Lung Cancer  Lung cancer screening is recommended for adults 53-48 years old who are at high risk for lung cancer because of a history of smoking.  A yearly low-dose CT scan of the lungs is recommended for people who:  Currently smoke.  Have quit within the past  15 years.  Have at least a 30-pack-year history of smoking. A pack year is smoking an average of one pack of cigarettes a day for 1 year.  Yearly screening should continue until it has been 15 years since you quit.  Yearly screening should stop if you develop a health problem that would prevent you from having lung cancer treatment.  Breast Cancer  Practice breast self-awareness. This means understanding how your breasts normally appear and feel.  It also means doing regular breast self-exams. Let your health care provider know about any changes, no matter how small.  If you are in your 20s or 30s, you should have a clinical breast exam (CBE) by a  health care provider every 1-3 years as part of a regular health exam.  If you are 24 or older, have a CBE every year. Also consider having a breast X-ray (mammogram) every year.  If you have a family history of breast cancer, talk to your health care provider about genetic screening.  If you are at high risk for breast cancer, talk to your health care provider about having an MRI and a mammogram every year.  Breast cancer gene (BRCA) assessment is recommended for women who have family members with BRCA-related cancers. BRCA-related cancers include:  Breast.  Ovarian.  Tubal.  Peritoneal cancers.  Results of the assessment will determine the need for genetic counseling and BRCA1 and BRCA2 testing. Cervical Cancer Your health care provider may recommend that you be screened regularly for cancer of the pelvic organs (ovaries, uterus, and vagina). This screening involves a pelvic examination, including checking for microscopic changes to the surface of your cervix (Pap test). You may be encouraged to have this screening done every 3 years, beginning at age 22.  For women ages 58-65, health care providers may recommend pelvic exams and Pap testing every 3 years, or they may recommend the Pap and pelvic exam, combined with testing for human papilloma virus (HPV), every 5 years. Some types of HPV increase your risk of cervical cancer. Testing for HPV may also be done on women of any age with unclear Pap test results.  Other health care providers may not recommend any screening for nonpregnant women who are considered low risk for pelvic cancer and who do not have symptoms. Ask your health care provider if a screening pelvic exam is right for you.  If you have had past treatment for cervical cancer or a condition that could lead to cancer, you need Pap tests and screening for cancer for at least 20 years after your treatment. If Pap tests have been discontinued, your risk factors (such as having a  new sexual partner) need to be reassessed to determine if screening should resume. Some women have medical problems that increase the chance of getting cervical cancer. In these cases, your health care provider may recommend more frequent screening and Pap tests. Colorectal Cancer  This type of cancer can be detected and often prevented.  Routine colorectal cancer screening usually begins at 51 years of age and continues through 51 years of age.  Your health care provider may recommend screening at an earlier age if you have risk factors for colon cancer.  Your health care provider may also recommend using home test kits to check for hidden blood in the stool.  A small camera at the end of a tube can be used to examine your colon directly (sigmoidoscopy or colonoscopy). This is done to check for the earliest forms of  colorectal cancer.  Routine screening usually begins at age 29.  Direct examination of the colon should be repeated every 5-10 years through 51 years of age. However, you may need to be screened more often if early forms of precancerous polyps or small growths are found. Skin Cancer  Check your skin from head to toe regularly.  Tell your health care provider about any new moles or changes in moles, especially if there is a change in a mole's shape or color.  Also tell your health care provider if you have a mole that is larger than the size of a pencil eraser.  Always use sunscreen. Apply sunscreen liberally and repeatedly throughout the day.  Protect yourself by wearing long sleeves, pants, a wide-brimmed hat, and sunglasses whenever you are outside. HEART DISEASE, DIABETES, AND HIGH BLOOD PRESSURE   High blood pressure causes heart disease and increases the risk of stroke. High blood pressure is more likely to develop in:  People who have blood pressure in the high end of the normal range (130-139/85-89 mm Hg).  People who are overweight or obese.  People who are  African American.  If you are 38-39 years of age, have your blood pressure checked every 3-5 years. If you are 51 years of age or older, have your blood pressure checked every year. You should have your blood pressure measured twice--once when you are at a hospital or clinic, and once when you are not at a hospital or clinic. Record the average of the two measurements. To check your blood pressure when you are not at a hospital or clinic, you can use:  An automated blood pressure machine at a pharmacy.  A home blood pressure monitor.  If you are between 28 years and 10 years old, ask your health care provider if you should take aspirin to prevent strokes.  Have regular diabetes screenings. This involves taking a blood sample to check your fasting blood sugar level.  If you are at a normal weight and have a low risk for diabetes, have this test once every three years after 51 years of age.  If you are overweight and have a high risk for diabetes, consider being tested at a younger age or more often. PREVENTING INFECTION  Hepatitis B  If you have a higher risk for hepatitis B, you should be screened for this virus. You are considered at high risk for hepatitis B if:  You were born in a country where hepatitis B is common. Ask your health care provider which countries are considered high risk.  Your parents were born in a high-risk country, and you have not been immunized against hepatitis B (hepatitis B vaccine).  You have HIV or AIDS.  You use needles to inject street drugs.  You live with someone who has hepatitis B.  You have had sex with someone who has hepatitis B.  You get hemodialysis treatment.  You take certain medicines for conditions, including cancer, organ transplantation, and autoimmune conditions. Hepatitis C  Blood testing is recommended for:  Everyone born from [birth year] through 1966.  Anyone with known risk factors for hepatitis C. Sexually transmitted infections  (STIs)  You should be screened for sexually transmitted infections (STIs) including gonorrhea and chlamydia if:  You are sexually active and are younger than 51 years of age.  You are older than 51 years of age and your health care provider tells you that you are at risk for this type of infection.  Your sexual  activity has changed since you were last screened and you are at an increased risk for chlamydia or gonorrhea. Ask your health care provider if you are at risk.  If you do not have HIV, but are at risk, it may be recommended that you take a prescription medicine daily to prevent HIV infection. This is called pre-exposure prophylaxis (PrEP). You are considered at risk if:  You are sexually active and do not regularly use condoms or know the HIV status of your partner(s).  You take drugs by injection.  You are sexually active with a partner who has HIV. Talk with your health care provider about whether you are at high risk of being infected with HIV. If you choose to begin PrEP, you should first be tested for HIV. You should then be tested every 3 months for as long as you are taking PrEP.  PREGNANCY   If you are premenopausal and you may become pregnant, ask your health care provider about preconception counseling.  If you may become pregnant, take 400 to 800 micrograms (mcg) of folic acid every day.  If you want to prevent pregnancy, talk to your health care provider about birth control (contraception). OSTEOPOROSIS AND MENOPAUSE   Osteoporosis is a disease in which the bones lose minerals and strength with aging. This can result in serious bone fractures. Your risk for osteoporosis can be identified using a bone density scan.  If you are 62 years of age or older, or if you are at risk for osteoporosis and fractures, ask your health care provider if you should be screened.  Ask your health care provider whether you should take a calcium or vitamin D supplement to lower your risk  for osteoporosis.  Menopause may have certain physical symptoms and risks.  Hormone replacement therapy may reduce some of these symptoms and risks. Talk to your health care provider about whether hormone replacement therapy is right for you.  HOME CARE INSTRUCTIONS   Schedule regular health, dental, and eye exams.  Stay current with your immunizations.   Do not use any tobacco products including cigarettes, chewing tobacco, or electronic cigarettes.  If you are pregnant, do not drink alcohol.  If you are breastfeeding, limit how much and how often you drink alcohol.  Limit alcohol intake to no more than 1 drink per day for nonpregnant women. One drink equals 12 ounces of beer, 5 ounces of wine, or 1 ounces of hard liquor.  Do not use street drugs.  Do not share needles.  Ask your health care provider for help if you need support or information about quitting drugs.  Tell your health care provider if you often feel depressed.  Tell your health care provider if you have ever been abused or do not feel safe at home.   This information is not intended to replace advice given to you by your health care provider. Make sure you discuss any questions you have with your health care provider.   Document Released: 01/19/2011 Document Revised: 07/27/2014 Document Reviewed: 06/07/2013 Elsevier Interactive Patient Education [employer].

## 2015-10-22 NOTE — Progress Notes (Addendum)
Subjective:   Morgan [name] is a 50 y.o. female who presents for an Initial Medicare Annual Wellness Visit.  Review of Systems     HRA assessment completed during visit; Morgan [name], Morgan [name]   The Patient was informed that this wellness visit is to identify risk and educate on how to reduce risk for increase disease through lifestyle changes.   ROS deferred to CPE exam with physician   Medical and family hx Mother Depression and HTN  Father Alz DM in grandparent   Dtr is 31 and one son 22; graduating from college; Estate manager/land agent; [place] she lost mental health; [place];    Tobacco; quit 07/1980; [place] yes;   Medical issues  ETOH; not drinking anymore Bipolar d/o/ not seeing therapist; Was told after last acute episode to find another therapist. States she is feeling good at present; Will not drink again; Is working on her health; was informed she can call [employer] to find another therapist as needed  Borderline personality;  Hx PTSD Recent Right ankle pain; has ankle brace; fell down stairs and sprained ankle; states she tripped  Also c/o of recent rash treated and appears to be resolving; Plans to see dermatologist    BMI: 29.6 /  Diet; Goal weight 150lbs  Hx has lost x 40 lbs; was between 135 and 140 and then had psychosocial issues and became depressed; trying to get back; (was homeless for a period of time but now living with dtr and other)  Dr. Quay Burow suggested go gluten free; having rashes and ha and both have stopped  Looking at [employer]; States she is learning more about gluten free diet and feels better;  Has  fitness pal and that changed her; stopped fast foods; no fried foods;  Gluten free diet book; Tracking sleep; exercise and diet; Walked here today from home;   Exercise; was walking 5+ miles a day; plans to pick this back up  Has a gym now due to benefit so does plan to go; walks 2.5 miles per day;  Has some asthma; limits pool  activity  SAFETY; Living situation is temporary; lease ends in July  Plan: The patient states she is looking at options; one day at a time  Removal of clutter clearing paths through the home,  Community safety; in process of moving;  Smoke detectors yes Driving accidents and seatbelt/ no car at present  [employer] protection/ no;   Stressors: 1- 5; pretty relaxed at present  Medication review/ not taking lithium; ended in January; states her therapist terminated her due to non-compliance.   Fall assessment / one fall; tripped   Mobilization and Functional losses in the last year/  Issues with back and neck; but knows what she can and can't do   Sleep patterns; doing better sleeping;   Urinary or fecal incontinence reviewed/no   Counseling: Colonoscopy; 09/2015; repeat 09/2025 EKG: 02/2015 Mammogram: will get that in time; wants to see dermatologist next; apt scheduled  PAP 02/2015; GYN apt in August  Hearing: 4000hz  more faded in left ear Ophthalmology exam; last eye exam; 2015; Due now  Has cataracts  Immunizations none due at present  Advanced Directive; Yes; will bring a copy when she can Health advice or referrals To have eye exam; To have mammogram this year To fup with [facility] for therapist  Current Care Team reviewed and updated          Objective:    Today's Vitals   10/22/15 0919  BP: 118/70  Height: 5' 3.5" (1.613 m)  Weight: 167 lb 6 oz (75.921 kg)   Body mass index is 29.18 kg/(m^2).   Current Medications (verified) Outpatient Encounter Prescriptions as of 10/22/2015  Medication Sig  . albuterol (PROVENTIL HFA;VENTOLIN HFA) 108 (90 Base) MCG/ACT inhaler Inhale into the lungs every 6 (six) hours as needed for wheezing or shortness of breath.  . doxepin (SINEQUAN) 25 MG capsule Reported on 10/22/2015  . hydrOXYzine (ATARAX/VISTARIL) 25 MG tablet take 1 tablet by mouth three times a day if needed for anxiety  . hyoscyamine (LEVSIN, ANASPAZ) 0.125 MG tablet  Take 0.125 mg by mouth as needed for cramping. Reported on 09/24/2015  . LORazepam (ATIVAN) 0.5 MG tablet   . triamcinolone cream (KENALOG) 0.1 % Apply 1 application topically 2 (two) times daily.  Marland Kitchen trimethoprim-polymyxin b (POLYTRIM) ophthalmic solution Place 1 drop into the left eye every 4 (four) hours. Use for 7 days (Patient not taking: Reported on 10/22/2015)   No facility-administered encounter medications on file as of 10/22/2015.    Allergies (verified) Abilify; Ciprofloxacin; Lamictal; Amoxicillin; Doxycycline; Latex; Meloxicam; Mirapex; Prednisone; Risperdal; Tape; Cortisone; Naproxen; Percocet; Other; Peanuts; and Sulfa antibiotics   History: Past Medical History  Diagnosis Date  . Asthma   . IBS (irritable bowel syndrome) 1995  . Arthritis   . Degenerative disorder of bone   . Bulging lumbar disc 07/19/05  . Bipolar disorder ([place])   . History of borderline personality disorder   . Anxiety   . Hypothyroidism 2007    Developed after use of Lithium  . Chronic headaches   . Colon polyps   . Proctitis   . Depression   . Pneumonia   . Substance abuse     ativan last month   Past Surgical History  Procedure Laterality Date  . Cholecystectomy    . Tubal ligation    . Abdominal hysterectomy    . Shoulder open rotator cuff repair Right 03/2010  . Bunionectomy Right 06/2012  . Appendectomy     Family History  Problem Relation Age of Onset  . Depression Mother   . Hypertension Mother   . Post-traumatic stress disorder Sister   . Alcohol abuse Brother   . Drug abuse Brother   . Post-traumatic stress disorder Brother   . Thyroid disease Father   . Alzheimer's disease Father   . COPD Maternal Grandmother   . Heart disease Maternal Grandfather   . Arthritis Paternal Grandmother   . Diabetes Paternal Grandmother   . Depression Paternal Grandmother   . Alzheimer's disease Paternal Grandmother   . Heart disease Paternal Grandfather   . Coronary artery disease Paternal  Grandfather   . Alcohol abuse Paternal Grandfather   . Colon cancer Neg Hx    Social History   Occupational History  . Unemployed    Social History Main Topics  . Smoking status: Former Smoker    Quit date: 07/20/1980  . Smokeless tobacco: Never Used  . Alcohol Use: 0.0 oz/week    0 Standard drinks or equivalent per week     Comment: unsure  . Drug Use: Yes    Special: Benzodiazepines  . Sexual Activity: No     Comment: intercourse age unknown,sexual partners less than 5    Tobacco Counseling Counseling given: Yes   Activities of Daily Living In your present state of health, do you have any difficulty performing the following activities: 10/22/2015 08/14/2015  Hearing? N N  Vision? N N  Difficulty concentrating  or making decisions? N N  Walking or climbing stairs? N N  Dressing or bathing? N N  Doing errands, shopping? N -  Preparing Food and eating ? N -  Using the Toilet? N -  In the past six months, have you accidently leaked urine? N -  Do you have problems with loss of bowel control? N -  Managing your Medications? N -  Managing your Finances? N -  Housekeeping or managing your Housekeeping? N -  Some encounter information is confidential and restricted. Go to Review Flowsheets activity to see all data.    Immunizations and Health Maintenance Immunization History  Administered Date(s) Administered  . Influenza-Unspecified 04/15/2015  . Tdap 04/15/2015   Health Maintenance Due  Topic Date Due  . MAMMOGRAM  12/27/2014    Patient Care Team: Binnie Rail, MD as PCP - General (Internal Medicine) Binnie Rail, MD (Internal Medicine)  Indicate any recent Medical Services you may have received from other than [facility] providers in the past year (date may be approximate).     Assessment:   This is a routine wellness examination for [place].  Eye exam to follow later this year;   Hearing/Vision screen  Hearing Screening   125Hz  250Hz  500Hz  1000Hz  2000Hz   4000Hz  8000Hz   Right ear:      100   Left ear:      100     Dietary issues and exercise activities discussed: Current Exercise Habits: Home exercise routine (now she can join a gym), Type of exercise: walking, Time (Minutes): 30 (slowed down when she had her surgery), Frequency (Times/Week): 5, Weekly Exercise (Minutes/Week): 150, Intensity: Moderate  Goals    . patient     Goal is to be independent with job  To continue your health objectives; diet and exercise       Depression Screen PHQ 2/9 Scores 10/22/2015  PHQ - 2 Score 0  Some encounter information is confidential and restricted. Go to Review Flowsheets activity to see all data.    States she was depressed but now managing one day at a time. Relates last episode of mental decline to drinking etoh; States she is not going to drink anymore as the consequences of her last action scared her;  She is trying to get her life back under control; Has developed a spiritual connection which is helping her. Verbalizes gratitude today; Working on goals every day. At apt today; appropriate in affect; smiling.   Fall Risk Fall Risk  10/22/2015  Falls in the past year? Yes  Number falls in past yr: 1  Follow up Education provided    Cognitive Function: MMSE - Mini Mental State Exam 10/22/2015  Not completed: (No Data)   No memory issues noted at present  Screening Tests Health Maintenance  Topic Date Due  . MAMMOGRAM  12/27/2014  . INFLUENZA VACCINE  02/18/2016  . PAP SMEAR  03/07/2018  . TETANUS/TDAP  04/14/2025  . COLONOSCOPY  09/23/2025  . HIV Screening  Completed      Plan:   Will have eye exam this year  Will have mammogram this year  Will bring Advanced Directive to the office when feasible  [employer] will find mental health counselor;   During the course of the visit, Tracie was educated and counseled about the following appropriate screening and preventive services:   Vaccines to include Pneumoccal, Influenza,  Hepatitis B, Td, Zostavax, HCV/up to date;    Electrocardiogram 02/2015  Cardiovascular disease screening/ BP good;  trying to lose weight  Colorectal cancer screening/ up to date; due 09/2025  Bone density screening: 50 yo; not scheduled; to see gyn in August  Diabetes screening/n/a  Glaucoma screening/ to have eye exam this year  Mammography/PAP/ to see GYN in August; encouraged to have mammogram and added to "to do" list.   Nutrition counseling/gluten free per MD; doing well; states h/a have stopped   Smoking cessation counseling/ no smoker; only smoked in [employer]   Patient Instructions (the written plan) were given to the patient.    Wynetta Fines, RN   10/22/2015      Medical screening examination/treatment/procedure(s) were performed by non-physician practitioner and as supervising physician I was immediately available for consultation/collaboration. I agree with above. Binnie Rail, MD

## 2015-11-25 ENCOUNTER — Telehealth: Payer: Self-pay | Admitting: Internal Medicine

## 2015-11-25 MED ORDER — FLUTICASONE FUROATE-VILANTEROL 100-25 MCG/INH IN AEPB: 1.0000 | INHALATION_SPRAY | Freq: Every day | RESPIRATORY_TRACT | Status: DC

## 2015-11-25 NOTE — Telephone Encounter (Signed)
Ok

## 2015-11-25 NOTE — Telephone Encounter (Signed)
Sent to POF per MDs approval, Breo 125mcg/25mcg

## 2015-11-25 NOTE — Telephone Encounter (Signed)
Pt states that she was given Sample of Breo at last OV, Please advise was dosage should be ordered.

## 2015-11-25 NOTE — Telephone Encounter (Signed)
Pt request new rx for Breo inhaler to be send into [facility]. She said she left massage last week but never heard anything. She was wondering if we can send it in today because she is leaving town tmrw, has to pick it up early today due to transportation problem.

## 2015-12-11 DIAGNOSIS — D2271 Melanocytic nevi of right lower limb, including hip: Secondary | ICD-10-CM | POA: Diagnosis not present

## 2015-12-11 DIAGNOSIS — D485 Neoplasm of uncertain behavior of skin: Secondary | ICD-10-CM | POA: Diagnosis not present

## 2015-12-11 DIAGNOSIS — L821 Other seborrheic keratosis: Secondary | ICD-10-CM | POA: Diagnosis not present

## 2015-12-11 DIAGNOSIS — D225 Melanocytic nevi of trunk: Secondary | ICD-10-CM | POA: Diagnosis not present

## 2015-12-11 DIAGNOSIS — D2262 Melanocytic nevi of left upper limb, including shoulder: Secondary | ICD-10-CM | POA: Diagnosis not present

## 2015-12-11 DIAGNOSIS — D2261 Melanocytic nevi of right upper limb, including shoulder: Secondary | ICD-10-CM | POA: Diagnosis not present

## 2015-12-11 DIAGNOSIS — D1801 Hemangioma of skin and subcutaneous tissue: Secondary | ICD-10-CM | POA: Diagnosis not present

## 2015-12-12 ENCOUNTER — Encounter: Payer: Self-pay | Admitting: Internal Medicine

## 2015-12-12 ENCOUNTER — Ambulatory Visit (INDEPENDENT_AMBULATORY_CARE_PROVIDER_SITE_OTHER): Payer: Medicare Other | Admitting: Internal Medicine

## 2015-12-12 ENCOUNTER — Other Ambulatory Visit (INDEPENDENT_AMBULATORY_CARE_PROVIDER_SITE_OTHER): Payer: Medicaid Other

## 2015-12-12 VITALS — BP 118/70 | HR 64 | Resp 20 | Wt 165.0 lb

## 2015-12-12 DIAGNOSIS — J45909 Unspecified asthma, uncomplicated: Secondary | ICD-10-CM | POA: Insufficient documentation

## 2015-12-12 DIAGNOSIS — E038 Other specified hypothyroidism: Secondary | ICD-10-CM | POA: Diagnosis not present

## 2015-12-12 DIAGNOSIS — R2231 Localized swelling, mass and lump, right upper limb: Secondary | ICD-10-CM | POA: Diagnosis not present

## 2015-12-12 DIAGNOSIS — J4520 Mild intermittent asthma, uncomplicated: Secondary | ICD-10-CM

## 2015-12-12 DIAGNOSIS — D72829 Elevated white blood cell count, unspecified: Secondary | ICD-10-CM | POA: Diagnosis not present

## 2015-12-12 DIAGNOSIS — R2240 Localized swelling, mass and lump, unspecified lower limb: Secondary | ICD-10-CM | POA: Insufficient documentation

## 2015-12-12 DIAGNOSIS — Z91048 Other nonmedicinal substance allergy status: Secondary | ICD-10-CM

## 2015-12-12 DIAGNOSIS — R2230 Localized swelling, mass and lump, unspecified upper limb: Secondary | ICD-10-CM | POA: Insufficient documentation

## 2015-12-12 DIAGNOSIS — R2242 Localized swelling, mass and lump, left lower limb: Secondary | ICD-10-CM | POA: Diagnosis not present

## 2015-12-12 DIAGNOSIS — M79652 Pain in left thigh: Secondary | ICD-10-CM | POA: Diagnosis not present

## 2015-12-12 DIAGNOSIS — Z9109 Other allergy status, other than to drugs and biological substances: Secondary | ICD-10-CM

## 2015-12-12 LAB — CBC WITH DIFFERENTIAL/PLATELET
BASOS ABS: 0.1 10*3/uL (ref 0.0–0.1)
Basophils Relative: 0.8 % (ref 0.0–3.0)
EOS PCT: 5.4 % — AB (ref 0.0–5.0)
Eosinophils Absolute: 0.5 10*3/uL (ref 0.0–0.7)
HCT: 44.6 % (ref 36.0–46.0)
Hemoglobin: 14.8 g/dL (ref 12.0–15.0)
LYMPHS ABS: 5.7 10*3/uL — AB (ref 0.7–4.0)
Lymphocytes Relative: 56.9 % — ABNORMAL HIGH (ref 12.0–46.0)
MCHC: 33.1 g/dL (ref 30.0–36.0)
MCV: 83.9 fl (ref 78.0–100.0)
MONO ABS: 0.6 10*3/uL (ref 0.1–1.0)
MONOS PCT: 5.5 % (ref 3.0–12.0)
NEUTROS ABS: 3.2 10*3/uL (ref 1.4–7.7)
NEUTROS PCT: 31.4 % — AB (ref 43.0–77.0)
PLATELETS: 271 10*3/uL (ref 150.0–400.0)
RBC: 5.32 Mil/uL — AB (ref 3.87–5.11)
RDW: 13.8 % (ref 11.5–15.5)
WBC: 10.1 10*3/uL (ref 4.0–10.5)

## 2015-12-12 LAB — TSH: TSH: 4.16 u[IU]/mL (ref 0.35–4.50)

## 2015-12-12 NOTE — Progress Notes (Signed)
Pre visit review using our clinic review tool, if applicable. No additional management support is needed unless otherwise documented below in the visit note. 

## 2015-12-12 NOTE — Assessment & Plan Note (Signed)
Mild uncontrolled, offered to add dulera or similar but declines any med change, but does ask for allergy referral

## 2015-12-12 NOTE — Assessment & Plan Note (Signed)
Pain out of proportion to the sub q lump, makes her more nervous as she is heavily emotionally invested in walking for exercise and fears this will slow or stop her; has bilat knee pain that I suspect could be related, but cant r/o meralgia paresthetica or left hip joint pain, declines other further eval or management, decliens gabapentin trial, was on this for a couple of years per pt until a few months ago, does not want other medication tx unless critical, prefers to focus on "all naturals"

## 2015-12-12 NOTE — Patient Instructions (Addendum)
Please continue all other medications as before, and refills have been done if requested.  Please have the pharmacy call with any other refills you may need.  Please keep your appointments with your specialists as you may have planned  You will be contacted regarding the referral for: General Surgury, and Allergy

## 2015-12-12 NOTE — Assessment & Plan Note (Signed)
suspect benign skin lump such as dermatofibroma, pt nervous, wants it out or treated, will refer to gen surgury

## 2015-12-12 NOTE — Progress Notes (Signed)
Subjective:    Patient ID: Morgan [name], female    DOB: [birth year], 50 y.o.   MRN: [id number]  HPI   Here with several lumps similar to ones she found in 2010, but now seemed to have recurred in the past 1 wk, specifically one subq to the back of the right arm, and one larger and 2 smaller to anterior left thigh. The left mid and prox ant thigh also has significant pain, mild to mod, constant but worse to ambulate, not sure if could be related to chronic bilat knee pain or something else, but just doesn't believe it to be muscular. No hx of meralgia or other neuritic problems except saw a dermatologist who suggested the itching recurring to the right flank area might be neuritic.  Lumps under skin that was a problem in 2010, but had TAH at the time, so biopsy not done and then cannot recall but thinks might have gotten better. No specific lower back or groin pain but has been seen for bilat knee pain per sport med, felt c/w patellofemoral pain.  No fever, trauma, swelling or other skin change.   Does have several wks ongoing nasal allergy symptoms with clearish congestion, itch and sneezing, without fever, pain, ST, cough, swelling or wheezing. Using her inhaler more frequently, does not want further meds, but wants to see allergist to be checked and then get some documentation to get out of her lease as she thinks black mold is thee.  Past Medical History  Diagnosis Date  . Asthma   . IBS (irritable bowel syndrome) 1995  . Arthritis   . Degenerative disorder of bone   . Bulging lumbar disc 07/19/05  . Bipolar disorder ([place])   . History of borderline personality disorder   . Anxiety   . Hypothyroidism 2007    Developed after use of Lithium  . Chronic headaches   . Colon polyps   . Proctitis   . Depression   . Pneumonia   . Substance abuse     ativan last month   Past Surgical History  Procedure Laterality Date  . Cholecystectomy    . Tubal ligation    . Abdominal hysterectomy    .  Shoulder open rotator cuff repair Right 03/2010  . Bunionectomy Right 06/2012  . Appendectomy      reports that she quit smoking about 35 years ago. She has never used smokeless tobacco. She reports that she drinks alcohol. She reports that she uses illicit drugs (Benzodiazepines). family history includes Alcohol abuse in her brother and paternal grandfather; Alzheimer's disease in her father and paternal grandmother; Arthritis in her paternal grandmother; COPD in her maternal grandmother; Coronary artery disease in her paternal grandfather; Depression in her mother and paternal grandmother; Diabetes in her paternal grandmother; Drug abuse in her brother; Heart disease in her maternal grandfather and paternal grandfather; Hypertension in her mother; Post-traumatic stress disorder in her brother and sister; Thyroid disease in her father. There is no history of Colon cancer. Allergies  Allergen Reactions  . Abilify [Aripiprazole] Swelling    Throat begins to swell   . Ciprofloxacin Shortness Of Breath  . Lamictal [Lamotrigine] Rash  . Amoxicillin Hives  . Doxycycline Hives  . Latex Rash  . Meloxicam Other (See Comments)    Induces mania  . Mirapex [Pramipexole] Hives  . Prednisone Other (See Comments)    Interacts with Lithium. " All steroids"  . Risperdal [Risperidone] Hives  . Tape Rash    "steri  strips"  . Cortisone     Exacerbates the mania of her bipolar  . Naproxen     Interacts with lithium.   Marland Kitchen Percocet [Oxycodone-Acetaminophen] Other (See Comments)    Severe dizziness  . Other Rash    Throat swelling and rash to W[place]NUTS and P[place] NUTS  . Peanuts [Peanut Oil] Rash    Throat swelling  . Sulfa Antibiotics Rash   Current Outpatient Prescriptions on File Prior to Visit  Medication Sig Dispense Refill  . albuterol (PROVENTIL HFA;VENTOLIN HFA) 108 (90 Base) MCG/ACT inhaler Inhale into the lungs every 6 (six) hours as needed for wheezing or shortness of breath.    . doxepin  (SINEQUAN) 25 MG capsule Reported on 10/22/2015  0  . fluticasone furoate-vilanterol (BREO ELLIPTA) 100-25 MCG/INH AEPB Inhale 1 puff into the lungs daily. 28 each 0  . hydrOXYzine (ATARAX/VISTARIL) 25 MG tablet take 1 tablet by mouth three times a day if needed for anxiety  0  . hyoscyamine (LEVSIN, ANASPAZ) 0.125 MG tablet Take 0.125 mg by mouth as needed for cramping. Reported on 09/24/2015    . LORazepam (ATIVAN) 0.5 MG tablet   0  . triamcinolone cream (KENALOG) 0.1 % Apply 1 application topically 2 (two) times daily. 30 g 0  . trimethoprim-polymyxin b (POLYTRIM) ophthalmic solution Place 1 drop into the left eye every 4 (four) hours. Use for 7 days 10 mL 0   No current facility-administered medications on file prior to visit.    Review of Systems  Constitutional: Negative for unusual diaphoresis or night sweats HENT: Negative for ear swelling or discharge Eyes: Negative for worsening visual haziness  Respiratory: Negative for choking and stridor.   Gastrointestinal: Negative for distension or worsening eructation Genitourinary: Negative for retention or change in urine volume.  Musculoskeletal: Negative for other MSK pain or swelling Skin: Negative for color change and worsening wound Neurological: Negative for tremors and numbness other than noted  Psychiatric/Behavioral: Negative for decreased concentration or agitation other than above       Objective:   Physical Exam BP 118/70 mmHg  Pulse 64  Resp 20  Wt 165 lb (74.844 kg)  SpO2 97% VS noted, non toxic Constitutional: Pt appears in no apparent distress HENT: Head: NCAT.  Right Ear: External ear normal.  Left Ear: External ear normal.  Eyes: . Pupils are equal, round, and reactive to light. Conjunctivae and EOM are normal Neck: Normal range of motion. Neck supple.  Cardiovascular: Normal rate and regular rhythm.   Pulmonary/Chest: Effort normal and breath sounds without rales or wheezing.  Neurological: Pt is alert. Not  confused , motor grossly intact Skin: Skin is warm. No rash, no LE edema, but has several subq lumps firm to left ant mid thigh, one directely mid anterior, other 2 smaller to the more distal medial upper leg above the knee, also a similar subq nodular nontender mass noted to right post arm mid tricep area, non with overlying skin change Psychiatric: Pt behavior is normal. No agitation. 2+ nervous    Assessment & Plan:

## 2015-12-12 NOTE — Assessment & Plan Note (Signed)
Ok to take otc zyrtec but not clear if she will do this

## 2015-12-20 ENCOUNTER — Ambulatory Visit (INDEPENDENT_AMBULATORY_CARE_PROVIDER_SITE_OTHER): Payer: Medicare Other | Admitting: Family Medicine

## 2015-12-20 ENCOUNTER — Telehealth: Payer: Self-pay | Admitting: Internal Medicine

## 2015-12-20 VITALS — BP 116/72 | HR 68 | Temp 97.9°F | Resp 18 | Ht 63.5 in | Wt 163.0 lb

## 2015-12-20 DIAGNOSIS — S80862A Insect bite (nonvenomous), left lower leg, initial encounter: Secondary | ICD-10-CM

## 2015-12-20 DIAGNOSIS — S40862A Insect bite (nonvenomous) of left upper arm, initial encounter: Secondary | ICD-10-CM

## 2015-12-20 DIAGNOSIS — D72820 Lymphocytosis (symptomatic): Secondary | ICD-10-CM | POA: Diagnosis not present

## 2015-12-20 DIAGNOSIS — W57XXXA Bitten or stung by nonvenomous insect and other nonvenomous arthropods, initial encounter: Secondary | ICD-10-CM

## 2015-12-20 DIAGNOSIS — L03113 Cellulitis of right upper limb: Secondary | ICD-10-CM

## 2015-12-20 DIAGNOSIS — R21 Rash and other nonspecific skin eruption: Secondary | ICD-10-CM | POA: Diagnosis not present

## 2015-12-20 DIAGNOSIS — S80861A Insect bite (nonvenomous), right lower leg, initial encounter: Secondary | ICD-10-CM

## 2015-12-20 DIAGNOSIS — R195 Other fecal abnormalities: Secondary | ICD-10-CM | POA: Diagnosis not present

## 2015-12-20 DIAGNOSIS — S40861A Insect bite (nonvenomous) of right upper arm, initial encounter: Secondary | ICD-10-CM

## 2015-12-20 LAB — POCT CBC
Granulocyte percent: 40.4 %G (ref 37–80)
HCT, POC: 44.5 % (ref 37.7–47.9)
Hemoglobin: 15.7 g/dL (ref 12.2–16.2)
LYMPH, POC: 6.5 — AB (ref 0.6–3.4)
MCH: 29 pg (ref 27–31.2)
MCHC: 35.3 g/dL (ref 31.8–35.4)
MCV: 82.1 fL (ref 80–97)
MID (CBC): 0.7 (ref 0–0.9)
MPV: 8.1 fL (ref 0–99.8)
PLATELET COUNT, POC: 242 10*3/uL (ref 142–424)
POC Granulocyte: 4.9 (ref 2–6.9)
POC LYMPH PERCENT: 54 %L — AB (ref 10–50)
POC MID %: 5.6 %M (ref 0–12)
RBC: 5.41 M/uL (ref 4.04–5.48)
RDW, POC: 13.3 %
WBC: 12.1 10*3/uL — AB (ref 4.6–10.2)

## 2015-12-20 LAB — IFOBT (OCCULT BLOOD): IFOBT: NEGATIVE

## 2015-12-20 MED ORDER — CEPHALEXIN 500 MG PO CAPS: 500.0000 mg | ORAL_CAPSULE | Freq: Two times a day (BID) | ORAL | Status: DC

## 2015-12-20 NOTE — Progress Notes (Addendum)
Subjective:  By signing my name below, I, Moises Blood, attest that this documentation has been prepared under the direction and in the presence of Merri Ray, MD. Electronically Signed: Moises Blood, [place]. 12/20/2015 , 6:14 PM .  Patient was seen in Room 1 .   Patient ID: Morgan [name], female    DOB: April 16, 1966, 50 y.o.   MRN: [id number] Chief Complaint  Patient presents with  . Insect Bite    arms and legs   . bowel issues   HPI Morgan [name] is a 50 y.o. female   Rash - insect bites Patient complains of insect bites over her arms and legs that was noticed a few months ago. She initially saw bites at the bottom of her feet. She's seen multiple providers for this issue. She saw Terri Piedra on 09/19/15 for this rash over her arms and her legs. She was given steroid cream to apply over the affected areas. Patient reports that she applies the cream 2-3 times a day over the affected areas without relief; last used this morning. She also complains that the cream causes some areas to have a burning sensation over new appearing rashes. She denies fever. She's also applied ice over the affected areas for some temporary relief.   Patient notes that she's seen a dermatologist last week but they didn't have an answer for her either. She informs taking benadryl 1 tablet at night. She's seen roaches in her house and also a spider on her pillow once. She's called an exterminator this morning because she found a bed bug. They mentioned going back to the patient's house next week with a dog and possibly spray her house.   Patient reports red patches over her extremities:  Right forearm: started yesterday, itches; bigger today versus yesterday Right foot: woke up with red patch this morning, worsening Left foot: had red patch 6 days ago, blistered and popped  GI issue She informs her bowels have been "weird: darker stools with little pieces that float and appears mucousy". This has started  recently in the last week. She denies seeing blood in her stool.   Patient reports having similar problems with intestinal issues after coming back from [place] last year.  She had a normal colonoscopy in 09/24/15, done by Dr. Ardis Hughs.   Antibiotics Patient states receiving 2 rounds of antibiotics in December for pneumonia. She informs the first round didn't work. She was taking zpak and another antibiotic. She note the zpak was working well. She reports when she's usually on a medication a lot, she becomes immune to their effect.   She's allergic to a lot of different classes of antibiotics.   Anxiety - stress She's been controlling her anxiety well. She's been exercising with walking. She denies having a psychiatrist currently, as her last one "gave up on her".   She's planning to move because her house also has mold.   Patient Active Problem List   Diagnosis Date Noted  . Skin lump of leg 12/12/2015  . Skin lump of arm 12/12/2015  . Left thigh pain 12/12/2015  . Asthma 12/12/2015  . Multiple environmental allergies 12/12/2015  . Rash and nonspecific skin eruption 09/19/2015  . Chronic lower back pain 09/12/2015  . Diarrhea 09/12/2015  . Joint pain 09/12/2015  . Bipolar 1 disorder, depressed ([place]) 08/27/2015    Class: Chronic  . Other specified hypothyroidism 08/23/2015  . Borderline personality disorder 08/15/2015  . Severe benzodiazepine use disorder 08/15/2015  . Alcohol use disorder,  moderate, dependence ([place]) 08/15/2015  . PTSD (post-traumatic stress disorder) 08/15/2015  . History of bipolar disorder 08/15/2015  . Bronchopneumonia 07/10/2015  . Cough 06/19/2015  . Pre-syncope 05/17/2015  . Skull deformity 05/17/2015  . Headache 05/17/2015  . Patellofemoral syndrome of both knees 05/02/2015  . Knee pain, bilateral 03/05/2015   Past Medical History  Diagnosis Date  . Asthma   . IBS (irritable bowel syndrome) 1995  . Arthritis   . Degenerative disorder of bone   .  Bulging lumbar disc 07/19/05  . Bipolar disorder ([place])   . History of borderline personality disorder   . Anxiety   . Hypothyroidism 2007    Developed after use of Lithium  . Chronic headaches   . Colon polyps   . Proctitis   . Depression   . Pneumonia   . Substance abuse     ativan last month   Past Surgical History  Procedure Laterality Date  . Cholecystectomy    . Tubal ligation    . Abdominal hysterectomy    . Shoulder open rotator cuff repair Right 03/2010  . Bunionectomy Right 06/2012  . Appendectomy     Allergies  Allergen Reactions  . Abilify [Aripiprazole] Swelling    Throat begins to swell   . Ciprofloxacin Shortness Of Breath  . Lamictal [Lamotrigine] Rash  . Amoxicillin Hives  . Doxycycline Hives  . Latex Rash  . Meloxicam Other (See Comments)    Induces mania  . Mirapex [Pramipexole] Hives  . Prednisone Other (See Comments)    Interacts with Lithium. " All steroids"  . Risperdal [Risperidone] Hives  . Tape Rash    "steri strips"  . Cortisone     Exacerbates the mania of her bipolar  . Naproxen     Interacts with lithium.   Marland Kitchen Percocet [Oxycodone-Acetaminophen] Other (See Comments)    Severe dizziness  . Other Rash    Throat swelling and rash to WALNUTS and PINE NUTS  . Peanuts [Peanut Oil] Rash    Throat swelling  . Sulfa Antibiotics Rash   Prior to Admission medications   Medication Sig Start Date End Date Taking? Authorizing Provider  albuterol (PROVENTIL HFA;VENTOLIN HFA) 108 (90 Base) MCG/ACT inhaler Inhale into the lungs every 6 (six) hours as needed for wheezing or shortness of breath.   Yes Historical Provider, MD  doxepin (SINEQUAN) 25 MG capsule Reported on 10/22/2015 08/20/15  Yes Historical Provider, MD  fluticasone furoate-vilanterol (BREO ELLIPTA) 100-25 MCG/INH AEPB Inhale 1 puff into the lungs daily. 11/25/15  Yes Binnie Rail, MD  hydrOXYzine (ATARAX/VISTARIL) 25 MG tablet take 1 tablet by mouth three times a day if needed for anxiety  08/20/15  Yes Historical Provider, MD  hyoscyamine (LEVSIN, ANASPAZ) 0.125 MG tablet Take 0.125 mg by mouth as needed for cramping. Reported on 09/24/2015   Yes Historical Provider, MD  LORazepam (ATIVAN) 0.5 MG tablet  09/12/15  Yes Historical Provider, MD  triamcinolone cream (KENALOG) 0.1 % Apply 1 application topically 2 (two) times daily. 09/19/15  Yes Golden Circle, FNP  trimethoprim-polymyxin b (POLYTRIM) ophthalmic solution Place 1 drop into the left eye every 4 (four) hours. Use for 7 days 09/25/15  Yes Binnie Rail, MD   Social History   Social History  . Marital Status: Divorced    Spouse Name: N/A  . Number of Children: 2  . Years of Education: 14   Occupational History  . Unemployed    Social History Main Topics  . Smoking  status: Former Smoker    Quit date: 07/20/1980  . Smokeless tobacco: Never Used  . Alcohol Use: 0.0 oz/week    0 Standard drinks or equivalent per week     Comment: unsure  . Drug Use: Yes    Special: Benzodiazepines  . Sexual Activity: No     Comment: intercourse age unknown,sexual partners less than 5   Other Topics Concern  . Not on file   Social History Narrative   Fun: [place], travel, music, writing, walking, hiking, volunteering   Denies religious beliefs effecting health care.    Feels safe at home.    Review of Systems  Constitutional: Negative for fever, chills, diaphoresis and fatigue.  Respiratory: Negative for cough.   Gastrointestinal: Negative for nausea, vomiting, abdominal pain, diarrhea, constipation and blood in stool.       Dark stools  Skin: Positive for rash.  Psychiatric/Behavioral: The patient is nervous/anxious.        Objective:   Physical Exam  Constitutional: She is oriented to person, place, and time. She appears well-developed and well-nourished. No distress.  HENT:  Head: Normocephalic and atraumatic.  Eyes: EOM are normal. Pupils are equal, round, and reactive to light.  Neck: Neck supple.  Cardiovascular:  Normal rate.   Pulmonary/Chest: Effort normal. No respiratory distress.  Musculoskeletal: Normal range of motion.  Neurological: She is alert and oriented to person, place, and time.  Skin: Skin is warm and dry.  Right foot: erythematous patch with central small vesicles, measures 3cm x 2cm with approximately 4 central vesicles Right forearm: erythema 5cm x 4cm with small faint papules in the center Right upper arm to elbow: there is erythema that measures approximately 9cm x 15cm with multiple small vesicular areas scattered throughout patch, slight induration  Nothing seen in the interdigital spaces on her hands  Psychiatric: She has a normal mood and affect. Her behavior is normal.  Nursing note and vitals reviewed.   Filed Vitals:   12/20/15 1600  BP: 116/72  Pulse: 68  Temp: 97.9 F (36.6 C)  TempSrc: Oral  Resp: 18  Height: 5' 3.5" (1.613 m)  Weight: 163 lb (73.936 kg)  SpO2: 98%   Results for orders placed or performed in visit on 12/20/15  POCT CBC  Result Value Ref Range   WBC 12.1 (A) 4.6 - 10.2 K/uL   Lymph, poc 6.5 (A) 0.6 - 3.4   POC LYMPH PERCENT 54.0 (A) 10 - 50 %L   MID (cbc) 0.7 0 - 0.9   POC MID % 5.6 0 - 12 %M   POC Granulocyte 4.9 2 - 6.9   Granulocyte percent 40.4 37 - 80 %G   RBC 5.41 4.04 - 5.48 M/uL   Hemoglobin 15.7 12.2 - 16.2 g/dL   HCT, POC 44.5 37.7 - 47.9 %   MCV 82.1 80 - 97 fL   MCH, POC 29.0 27 - 31.2 pg   MCHC 35.3 31.8 - 35.4 g/dL   RDW, POC 13.3 %   Platelet Count, POC 242 142 - 424 K/uL   MPV 8.1 0 - 99.8 fL  IFOBT POC (occult bld, rslt in office)  Result Value Ref Range   IFOBT Negative       Assessment & Plan:   Reniya Younker is a 50 y.o. female Insect bites Cellulitis of arm, right - Plan: POCT CBC, cephALEXin (KEFLEX) 500 MG capsule Rash and nonspecific skin eruption  - Based on appearance of rash, appear to be small insect bites  versus spider bites. Areas on right forearm and right foot do not appear to be infected,  but suspect is secondary cellulitis of right upper arm/elbow area. Able to bend and straighten elbow without significant difficulty, but significant induration and erythema as outlined above.  - Recommended Zyrtec or Claritin over-the-counter to help with itching symptoms, continue steroid cream to affected areas. Cold compresses, and other handout on insect bites given below.  -Keflex 500 mg twice a day for cellulitis. She has multiple allergies and intolerances, and discussed the amoxicillin allergy. If she does have hives or new reaction with the Keflex, should proceed directly to the emergency room.  -Recheck cellulitis tomorrow, or to emergency room overnight morning if she feels she is worse.  -If persistent insect bites or rashes, recommend follow-up with dermatologist or consider biopsy of lesion.   Dark stools - Plan: IFOBT POC (occult bld, rslt in office)  - Heme-negative stool. Advised to discuss with her gastroenterologist if persistent changes in stool pattern.  Lymphocytosis  - Also seen at last visit with PCP. Will treat cellulitis as above, consider repeat CBC next week or follow-up with primary care provider.   Meds ordered this encounter  Medications  . cephALEXin (KEFLEX) 500 MG capsule    Sig: Take 1 capsule (500 mg total) by mouth 2 (two) times daily.    Dispense:  20 capsule    Refill:  0   Patient Instructions       IF you received an x-ray today, you will receive an invoice from [facility]. Please contact [facility] at [phone] with questions or concerns regarding your invoice.   IF you received labwork today, you will receive an invoice from [employer]. Please contact [facility] at [phone] with questions or concerns regarding your invoice.   Our billing staff will not be able to assist you with questions regarding bills from these companies.  You will be contacted with the lab results as soon as they are  available. The fastest way to get your results is to activate your My Chart account. Instructions are located on the last page of this paperwork. If you have not heard from [place] regarding the results in 2 weeks, please contact this office.    We recommend that you schedule a mammogram for breast cancer screening. Typically, you do not need a referral to do this. Please contact a local imaging center to schedule your mammogram.  [facility] - [phone]  *ask for the Radiology Department [facility] ([place]) - [phone] or [phone]  [facility] - [phone] [place] [phone] [facility] - [phone]  *ask for the [facility] - [phone]  *ask for the Radiology Department [facility] - [phone]  *ask for the [place] [facility] - [phone]  The areas on your forearm and foot appear to be insect bites of some type. Recommend continuing the steroid cream to the affected area, cold compresses, and you can try Zyrtec or Claritin over-the-counter once per day for the itching. Would recommend discussing this rash with your primary care provider to see if repeat dermatology evaluation needed, or I can refer you to dermatology if these areas are not improving into next week.    Your upper arm area appears to have a secondary skin infection, or cellulitis. I will outline that area today, start Keflex to cover for infection, and recheck tomorrow.  If there is increased redness tomorrow, will likely have you evaluated through the emergency room. If you have any worsening of your symptoms tonight, you can proceed to the emergency room earlier. Although the Keflex is not in the same class as amoxicillin, if you do break out in hives or other sign of reaction to that medicine - be seen in the emergency room. Please let me know if you have any  questions.  If you continue to have changes in your bowel movements, call your gastroenterologist determine if other testing needed.  Return to the clinic or go to the nearest emergency room if any of your symptoms worsen or new symptoms occur.     Insect Bite Mosquitoes, flies, fleas, bedbugs, and many other insects can bite. Insect bites are different from insect stings. A sting is when poison (venom) is injected into the skin. Insect bites can cause pain or itching for a few days, but they are usually not serious. Some insects can spread diseases to people through a bite. SYMPTOMS  Symptoms of an insect bite include:  Itching or pain in the bite area.  Redness and swelling in the bite area.  An open wound (skin ulcer). In many cases, symptoms last for 2-4 days.  DIAGNOSIS  This condition is usually diagnosed based on symptoms and a physical exam. TREATMENT  Treatment is usually not needed for an insect bite. Symptoms often go away on their own. Your health care provider may recommend creams or lotions to help reduce itching. Antibiotic medicines may be prescribed if the bite becomes infected. A tetanus shot may be given in some cases. If you develop an allergic reaction to an insect bite, your health care provider will prescribe medicines to treat the reaction (antihistamines). This is rare. HOME CARE INSTRUCTIONS  Do not scratch the bite area.  Keep the bite area clean and dry. Wash the bite area daily with soap and water as told by your health care provider.  If directed, applyice to the bite area.  Put ice in a plastic bag.  Place a towel between your skin and the bag.  Leave the ice on for 20 minutes, 2-3 times per day.  To help reduce itching and swelling, try applying a baking soda paste, cortisone cream, or calamine lotion to the bite area as told by your health care provider.  Apply or take over-the-counter and prescription medicines only as told by your health  care provider.  If you were prescribed an antibiotic medicine, use it as told by your health care provider. Do not stop using the antibiotic even if your condition improves.  Keep all follow-up visits as told by your health care provider. This is important. PREVENTION   Use insect repellent. The best insect repellents contain:  DEET, picaridin, oil of lemon eucalyptus (OLE), or IR3535.  Higher amounts of an active ingredient.  When you are outdoors, wear clothing that covers your arms and legs.  Avoid opening windows that do not have window screens. SEEK MEDICAL CARE IF:  You have increased redness, swelling, or pain in the bite area.  You have a fever. SEEK IMMEDIATE MEDICAL CARE IF:   You have joint pain.   You have fluid, blood, or pus coming from the bite area.  You have a headache or neck pain.  You have unusual weakness.  You have a rash.  You have chest pain or shortness of breath.  You have abdominal pain, nausea, or vomiting.  You feel unusually  tired or sleepy.   This information is not intended to replace advice given to you by your health care provider. Make sure you discuss any questions you have with your health care provider.   Document Released: 08/13/2004 Document Revised: 03/27/2015 Document Reviewed: 11/21/2014 Elsevier Interactive Patient Education 2016 Elsevier Inc.   Cellulitis Cellulitis is an infection of the skin and the tissue beneath it. The infected area is usually red and tender. Cellulitis occurs most often in the arms and lower legs.  CAUSES  Cellulitis is caused by bacteria that enter the skin through cracks or cuts in the skin. The most common types of bacteria that cause cellulitis are staphylococci and streptococci. SIGNS AND SYMPTOMS   Redness and warmth.  Swelling.  Tenderness or pain.  Fever. DIAGNOSIS  Your health care provider can usually determine what is wrong based on a physical exam. Blood tests may also be  done. TREATMENT  Treatment usually involves taking an antibiotic medicine. HOME CARE INSTRUCTIONS   Take your antibiotic medicine as directed by your health care provider. Finish the antibiotic even if you start to feel better.  Keep the infected arm or leg elevated to reduce swelling.  Apply a warm cloth to the affected area up to 4 times per day to relieve pain.  Take medicines only as directed by your health care provider.  Keep all follow-up visits as directed by your health care provider. SEEK MEDICAL CARE IF:   You notice red streaks coming from the infected area.  Your red area gets larger or turns dark in color.  Your bone or joint underneath the infected area becomes painful after the skin has healed.  Your infection returns in the same area or another area.  You notice a swollen bump in the infected area.  You develop new symptoms.  You have a fever. SEEK IMMEDIATE MEDICAL CARE IF:   You feel very sleepy.  You develop vomiting or diarrhea.  You have a general ill feeling (malaise) with muscle aches and pains.   This information is not intended to replace advice given to you by your health care provider. Make sure you discuss any questions you have with your health care provider.   Document Released: 04/15/2005 Document Revised: 03/27/2015 Document Reviewed: 09/21/2011 Elsevier Interactive Patient Education [employer].     I personally performed the services described in this documentation, which was scribed in my presence. The recorded information has been reviewed and considered, and addended by me as needed.   Signed,   Merri Ray, MD [facility].  12/20/2015 7:18 PM

## 2015-12-20 NOTE — Telephone Encounter (Signed)
Please advise 

## 2015-12-20 NOTE — Patient Instructions (Addendum)
IF you received an x-ray today, you will receive an invoice from [facility]. Please contact [facility] at [phone] with questions or concerns regarding your invoice.   IF you received labwork today, you will receive an invoice from [employer]. Please contact [facility] at [phone] with questions or concerns regarding your invoice.   Our billing staff will not be able to assist you with questions regarding bills from these companies.  You will be contacted with the lab results as soon as they are available. The fastest way to get your results is to activate your My Chart account. Instructions are located on the last page of this paperwork. If you have not heard from [place] regarding the results in 2 weeks, please contact this office.    We recommend that you schedule a mammogram for breast cancer screening. Typically, you do not need a referral to do this. Please contact a local imaging center to schedule your mammogram.  [facility] - [phone]  *ask for the Radiology Department [facility] ([place]) - [phone] or [phone]  [facility] - [phone] [place] [phone] [facility] - [phone]  *ask for the [facility] - [phone]  *ask for the Radiology Department [facility] - [phone]  *ask for the [facility] - [phone]  The areas on your forearm and foot appear to be insect bites of some type. Recommend continuing the steroid cream to the affected area, cold compresses, and you can try Zyrtec or Claritin over-the-counter once per day for the itching. Would recommend discussing this rash with your primary care provider to see if repeat dermatology evaluation needed, or I can refer you to dermatology if these areas are not improving into next week.    Your  upper arm area appears to have a secondary skin infection, or cellulitis. I will outline that area today, start Keflex to cover for infection, and recheck tomorrow. If there is increased redness tomorrow, will likely have you evaluated through the emergency room. If you have any worsening of your symptoms tonight, you can proceed to the emergency room earlier. Although the Keflex is not in the same class as amoxicillin, if you do break out in hives or other sign of reaction to that medicine - be seen in the emergency room. Please let me know if you have any questions.  If you continue to have changes in your bowel movements, call your gastroenterologist determine if other testing needed.  Return to the clinic or go to the nearest emergency room if any of your symptoms worsen or new symptoms occur.     Insect Bite Mosquitoes, flies, fleas, bedbugs, and many other insects can bite. Insect bites are different from insect stings. A sting is when poison (venom) is injected into the skin. Insect bites can cause pain or itching for a few days, but they are usually not serious. Some insects can spread diseases to people through a bite. SYMPTOMS  Symptoms of an insect bite include:  Itching or pain in the bite area.  Redness and swelling in the bite area.  An open wound (skin ulcer). In many cases, symptoms last for 2-4 days.  DIAGNOSIS  This condition is usually diagnosed based on symptoms and a physical exam. TREATMENT  Treatment is usually not needed for an insect bite. Symptoms often go away on their own. Your health  care provider may recommend creams or lotions to help reduce itching. Antibiotic medicines may be prescribed if the bite becomes infected. A tetanus shot may be given in some cases. If you develop an allergic reaction to an insect bite, your health care provider will prescribe medicines to treat the reaction (antihistamines). This is rare. HOME CARE INSTRUCTIONS  Do not scratch the  bite area.  Keep the bite area clean and dry. Wash the bite area daily with soap and water as told by your health care provider.  If directed, applyice to the bite area.  Put ice in a plastic bag.  Place a towel between your skin and the bag.  Leave the ice on for 20 minutes, 2-3 times per day.  To help reduce itching and swelling, try applying a baking soda paste, cortisone cream, or calamine lotion to the bite area as told by your health care provider.  Apply or take over-the-counter and prescription medicines only as told by your health care provider.  If you were prescribed an antibiotic medicine, use it as told by your health care provider. Do not stop using the antibiotic even if your condition improves.  Keep all follow-up visits as told by your health care provider. This is important. PREVENTION   Use insect repellent. The best insect repellents contain:  DEET, picaridin, oil of lemon eucalyptus (OLE), or IR3535.  Higher amounts of an active ingredient.  When you are outdoors, wear clothing that covers your arms and legs.  Avoid opening windows that do not have window screens. SEEK MEDICAL CARE IF:  You have increased redness, swelling, or pain in the bite area.  You have a fever. SEEK IMMEDIATE MEDICAL CARE IF:   You have joint pain.   You have fluid, blood, or pus coming from the bite area.  You have a headache or neck pain.  You have unusual weakness.  You have a rash.  You have chest pain or shortness of breath.  You have abdominal pain, nausea, or vomiting.  You feel unusually tired or sleepy.   This information is not intended to replace advice given to you by your health care provider. Make sure you discuss any questions you have with your health care provider.   Document Released: 08/13/2004 Document Revised: 03/27/2015 Document Reviewed: 11/21/2014 Elsevier Interactive Patient Education 2016 Elsevier Inc.   Cellulitis Cellulitis is an  infection of the skin and the tissue beneath it. The infected area is usually red and tender. Cellulitis occurs most often in the arms and lower legs.  CAUSES  Cellulitis is caused by bacteria that enter the skin through cracks or cuts in the skin. The most common types of bacteria that cause cellulitis are staphylococci and streptococci. SIGNS AND SYMPTOMS   Redness and warmth.  Swelling.  Tenderness or pain.  Fever. DIAGNOSIS  Your health care provider can usually determine what is wrong based on a physical exam. Blood tests may also be done. TREATMENT  Treatment usually involves taking an antibiotic medicine. HOME CARE INSTRUCTIONS   Take your antibiotic medicine as directed by your health care provider. Finish the antibiotic even if you start to feel better.  Keep the infected arm or leg elevated to reduce swelling.  Apply a warm cloth to the affected area up to 4 times per day to relieve pain.  Take medicines only as directed by your health care provider.  Keep all follow-up visits as directed by your health care provider. SEEK MEDICAL CARE IF:  You notice red streaks coming from the infected area.  Your red area gets larger or turns dark in color.  Your bone or joint underneath the infected area becomes painful after the skin has healed.  Your infection returns in the same area or another area.  You notice a swollen bump in the infected area.  You develop new symptoms.  You have a fever. SEEK IMMEDIATE MEDICAL CARE IF:   You feel very sleepy.  You develop vomiting or diarrhea.  You have a general ill feeling (malaise) with muscle aches and pains.   This information is not intended to replace advice given to you by your health care provider. Make sure you discuss any questions you have with your health care provider.   Document Released: 04/15/2005 Document Revised: 03/27/2015 Document Reviewed: 09/21/2011 Elsevier Interactive Patient Education [employer]

## 2015-12-20 NOTE — Telephone Encounter (Signed)
NOTE: All timestamps contained within this report are represented as [place] Standard Time. CONFIDENTIALTY NOTICE: This fax transmission is intended only for the addressee. It contains information that is legally privileged, confidential or otherwise protected from use or disclosure. If you are not the intended recipient, you are strictly prohibited from reviewing, disclosing, copying using or disseminating any of this information or taking any action in reliance on or regarding this information. If you have received this fax in error, please notify us immediately by telephone so that we can arrange for its return to [place]. Phone: [phone], Toll-Free: [phone], Fax: [phone] Page: 1 of 1 Call Id: [id number] [facility] - Client [place] Patient Name: Morgan [name] DOB: 19-Feb-1966 Initial Comment Caller has insect bites on her arms and feet. It is swollen and warm. It also itches. Nurse Assessment Nurse: Dimas Chyle, RN, Dellis Filbert Date/Time Eilene Ghazi Time): 12/20/2015 10:38:02 AM Confirm and document reason for call. If symptomatic, describe symptoms. You must click the next button to save text entered. ---Caller has insect bites on her arms and feet. It is swollen and warm. It also itches. Got bites yesterday morning. Has the patient traveled out of the country within the last 30 days? ---No Does the patient have any new or worsening symptoms? ---Yes Will a triage be completed? ---Yes Related visit to physician within the last 2 weeks? ---No Does the PT have any chronic conditions? (i.e. diabetes, asthma, etc.) ---Yes List chronic conditions. ---Asthma Is the patient pregnant or possibly pregnant? (Ask all females between the ages of 79-55) ---No Is this a behavioral health or substance abuse call? ---No Guidelines Guideline Title Affirmed Question Affirmed Notes Insect Bite [1] Red or very tender (to touch) area  AND [2] started over 24 hours after the bite Final Disposition User See Physician within [place], RN, [employer] Referrals REFERRED TO PCP OFFICE [facility] Disagree/Comply: Comply

## 2015-12-20 NOTE — Telephone Encounter (Signed)
Going to urgent care - agree.

## 2015-12-21 ENCOUNTER — Ambulatory Visit: Payer: Self-pay | Admitting: Family Medicine

## 2015-12-21 ENCOUNTER — Ambulatory Visit (INDEPENDENT_AMBULATORY_CARE_PROVIDER_SITE_OTHER): Payer: Medicare Other | Admitting: Family Medicine

## 2015-12-21 VITALS — BP 116/75 | HR 82 | Temp 98.3°F | Resp 16 | Ht 63.5 in | Wt 163.0 lb

## 2015-12-21 DIAGNOSIS — S80861A Insect bite (nonvenomous), right lower leg, initial encounter: Secondary | ICD-10-CM | POA: Diagnosis not present

## 2015-12-21 DIAGNOSIS — W57XXXA Bitten or stung by nonvenomous insect and other nonvenomous arthropods, initial encounter: Secondary | ICD-10-CM | POA: Diagnosis not present

## 2015-12-21 DIAGNOSIS — S40862A Insect bite (nonvenomous) of left upper arm, initial encounter: Secondary | ICD-10-CM | POA: Diagnosis not present

## 2015-12-21 DIAGNOSIS — S80862A Insect bite (nonvenomous), left lower leg, initial encounter: Secondary | ICD-10-CM

## 2015-12-21 DIAGNOSIS — R21 Rash and other nonspecific skin eruption: Secondary | ICD-10-CM | POA: Diagnosis not present

## 2015-12-21 DIAGNOSIS — S40861A Insect bite (nonvenomous) of right upper arm, initial encounter: Secondary | ICD-10-CM | POA: Diagnosis not present

## 2015-12-21 DIAGNOSIS — L03113 Cellulitis of right upper limb: Secondary | ICD-10-CM

## 2015-12-21 NOTE — Progress Notes (Signed)
By signing my name below I, Tereasa Coop, attest that this documentation has been prepared under the direction and in the presence of Wendie Agreste, MD. Electonically Signed. Tereasa Coop, Scribe 12/21/2015 at 3:58 PM  Subjective:    Patient ID: Morgan [name], female    DOB: Nov 08, 1965, 50 y.o.   MRN: [id number]  Chief Complaint  Patient presents with  . Follow-up    cellulitis of the right arm and right foot, sxs worsen since yesterday     HPI Morgan [name] is a 50 y.o. female who presents to the [facility] for cellulitis and multiple bug bites on rt arm. Pt reports area of erythema has grown since yesterday. Pt also reports that the rash on her rt foot started blistering last night.  Pt has been fatigued today. Pt states that she slept well due to the zyrtec that she took yesterday only. Pt has taken the first 2 doses of her keflex faithfully. Pt woke with a HA this morning. Pt denies any fever or chills.   Pt has an exterminator coming in and is concerned about continuing bug bites in her house. Pt's daughter was getting bit by bugs in her bed as well. The daughter moved to sleeping on the couch and has not noticed any new bites.   Pt was seen and evaluated yesterday and dx with cellulitis. Started on keflex due to multiple allergies and intolerances. Area of erythema was marked yesterday.  Results for orders placed or performed in visit on 12/20/15  POCT CBC  Result Value Ref Range   WBC 12.1 (A) 4.6 - 10.2 K/uL   Lymph, poc 6.5 (A) 0.6 - 3.4   POC LYMPH PERCENT 54.0 (A) 10 - 50 %L   MID (cbc) 0.7 0 - 0.9   POC MID % 5.6 0 - 12 %M   POC Granulocyte 4.9 2 - 6.9   Granulocyte percent 40.4 37 - 80 %G   RBC 5.41 4.04 - 5.48 M/uL   Hemoglobin 15.7 12.2 - 16.2 g/dL   HCT, POC 44.5 37.7 - 47.9 %   MCV 82.1 80 - 97 fL   MCH, POC 29.0 27 - 31.2 pg   MCHC 35.3 31.8 - 35.4 g/dL   RDW, POC 13.3 %   Platelet Count, POC 242 142 - 424 K/uL   MPV 8.1 0 - 99.8 fL    IFOBT POC (occult bld, rslt in office)  Result Value Ref Range   IFOBT Negative       Patient Active Problem List   Diagnosis Date Noted  . Skin lump of leg 12/12/2015  . Skin lump of arm 12/12/2015  . Left thigh pain 12/12/2015  . Asthma 12/12/2015  . Multiple environmental allergies 12/12/2015  . Rash and nonspecific skin eruption 09/19/2015  . Chronic lower back pain 09/12/2015  . Diarrhea 09/12/2015  . Joint pain 09/12/2015  . Bipolar 1 disorder, depressed ([place]) 08/27/2015    Class: Chronic  . Other specified hypothyroidism 08/23/2015  . Borderline personality disorder 08/15/2015  . Severe benzodiazepine use disorder 08/15/2015  . Alcohol use disorder, moderate, dependence ([place]) 08/15/2015  . PTSD (post-traumatic stress disorder) 08/15/2015  . History of bipolar disorder 08/15/2015  . Bronchopneumonia 07/10/2015  . Cough 06/19/2015  . Pre-syncope 05/17/2015  . Skull deformity 05/17/2015  . Headache 05/17/2015  . Patellofemoral syndrome of both knees 05/02/2015  . Knee pain, bilateral 03/05/2015   Past Medical History  Diagnosis Date  . Asthma   .  IBS (irritable bowel syndrome) 1995  . Arthritis   . Degenerative disorder of bone   . Bulging lumbar disc 07/19/05  . Bipolar disorder ([place])   . History of borderline personality disorder   . Anxiety   . Hypothyroidism 2007    Developed after use of Lithium  . Chronic headaches   . Colon polyps   . Proctitis   . Depression   . Pneumonia   . Substance abuse     ativan last month   Past Surgical History  Procedure Laterality Date  . Cholecystectomy    . Tubal ligation    . Abdominal hysterectomy    . Shoulder open rotator cuff repair Right 03/2010  . Bunionectomy Right 06/2012  . Appendectomy     Allergies  Allergen Reactions  . Abilify [Aripiprazole] Swelling    Throat begins to swell   . Ciprofloxacin Shortness Of Breath  . Lamictal [Lamotrigine] Rash  . Amoxicillin Hives  . Doxycycline Hives  .  Latex Rash  . Meloxicam Other (See Comments)    Induces mania  . Mirapex [Pramipexole] Hives  . Prednisone Other (See Comments)    Interacts with Lithium. " All steroids"  . Risperdal [Risperidone] Hives  . Tape Rash    "steri strips"  . Cortisone     Exacerbates the mania of her bipolar  . Naproxen     Interacts with lithium.   Marland Kitchen Percocet [Oxycodone-Acetaminophen] Other (See Comments)    Severe dizziness  . Other Rash    Throat swelling and rash to WALNUTS and PINE NUTS  . Peanuts [Peanut Oil] Rash    Throat swelling  . Sulfa Antibiotics Rash   Prior to Admission medications   Medication Sig Start Date End Date Taking? Authorizing Provider  albuterol (PROVENTIL HFA;VENTOLIN HFA) 108 (90 Base) MCG/ACT inhaler Inhale into the lungs every 6 (six) hours as needed for wheezing or shortness of breath.   Yes Historical Provider, MD  cephALEXin (KEFLEX) 500 MG capsule Take 1 capsule (500 mg total) by mouth 2 (two) times daily. 12/20/15  Yes Wendie Agreste, MD  doxepin [facility]) 25 MG capsule Reported on 10/22/2015 08/20/15  Yes Historical Provider, MD  fluticasone furoate-vilanterol (BREO ELLIPTA) 100-25 MCG/INH AEPB Inhale 1 puff into the lungs daily. 11/25/15  Yes Binnie Rail, MD  hydrOXYzine (ATARAX/VISTARIL) 25 MG tablet take 1 tablet by mouth three times a day if needed for anxiety 08/20/15  Yes Historical Provider, MD  hyoscyamine (LEVSIN, ANASPAZ) 0.125 MG tablet Take 0.125 mg by mouth as needed for cramping. Reported on 09/24/2015   Yes Historical Provider, MD  LORazepam (ATIVAN) 0.5 MG tablet  09/12/15  Yes Historical Provider, MD  triamcinolone cream (KENALOG) 0.1 % Apply 1 application topically 2 (two) times daily. 09/19/15  Yes Golden Circle, FNP  trimethoprim-polymyxin b (POLYTRIM) ophthalmic solution Place 1 drop into the left eye every 4 (four) hours. Use for 7 days 09/25/15  Yes Binnie Rail, MD   Social History   Social History  . Marital Status: Divorced    Spouse Name: N/A    . Number of Children: 2  . Years of Education: 14   Occupational History  . Unemployed    Social History Main Topics  . Smoking status: Former Smoker    Quit date: 07/20/1980  . Smokeless tobacco: Never Used  . Alcohol Use: 0.0 oz/week    0 Standard drinks or equivalent per week     Comment: unsure  . Drug Use:  Yes    Special: Benzodiazepines  . Sexual Activity: No     Comment: intercourse age unknown,sexual partners less than 5   Other Topics Concern  . Not on file   Social History Narrative   Fun: [place], travel, music, writing, walking, hiking, volunteering   Denies religious beliefs effecting health care.    Feels safe at home.       Review of Systems  Constitutional: Positive for fatigue. Negative for chills.  Skin: Positive for rash (rt arm).  Neurological: Positive for headaches.  Psychiatric/Behavioral: Negative for sleep disturbance.       Objective:   Physical Exam  Constitutional: She is oriented to person, place, and time. She appears well-developed and well-nourished. No distress.  HENT:  Head: Normocephalic and atraumatic.  Eyes: Conjunctivae are normal. Pupils are equal, round, and reactive to light.  Neck: Neck supple.  Cardiovascular: Normal rate.   Pulmonary/Chest: Effort normal.  Musculoskeletal: Normal range of motion.  Neurological: She is alert and oriented to person, place, and time. Gait normal.  Skin: Skin is warm and dry.  Rt upper arm Few scattered small vesicular papular areas with surrounding erythema. Erythema has faded and receded slightly from outline yesterday. Rt foot: Small bolla with clear fluid approximately 74mm in size. This was opened laterally with number 15 blade after alcohol swab. Viral and wound cultures obtained.  Psychiatric: She has a normal mood and affect. Her behavior is normal.  Nursing note and vitals reviewed.    Filed Vitals:   12/21/15 1450  BP: 116/75  Pulse: 82  Temp: 98.3 F (36.8 C)  TempSrc:  Oral  Resp: 16  Height: 5' 3.5" (1.613 m)  Weight: 163 lb (73.936 kg)  SpO2: 98%       Assessment & Plan:   Morgan [name] is a 50 y.o. female Cellulitis of arm, right  Rash and nonspecific skin eruption - Plan: Viral culture, WOUND CULTURE ([facility] ONLY), Ambulatory referral to Dermatology  Insect bites  Still appeared to be some form of insect bites with secondary cellulitis of the elbow. Cellulitis appears to be stable/improving with less redness. Afebrile. Appears to be tolerating the Keflex.  -Doubt infection of foot lesion, but with bulla, this was lanced laterally with 15 blade, bacterial and viral cultures obtained.  -Continue Keflex twice a day, follow up in 48 hours for wound recheck, or to the ER sooner if fever, or other worsening symptoms.  -referral to dermatology for second opinion as seen by previous dermatologist without known cause by report.  -ER/RTC precautions discussed.  No orders of the defined types were placed in this encounter.   Patient Instructions       IF you received an x-ray today, you will receive an invoice from [facility]. Please contact [facility] at [phone] with questions or concerns regarding your invoice.   IF you received labwork today, you will receive an invoice from [employer]. Please contact [facility] at [phone] with questions or concerns regarding your invoice.   Our billing staff will not be able to assist you with questions regarding bills from these companies.  You will be contacted with the lab results as soon as they are available. The fastest way to get your results is to activate your My Chart account. Instructions are located on the last page of this paperwork. If you have not heard from [place] regarding the results in 2 weeks, please contact this office.    Cellulitis Cellulitis is an infection of  the skin and the tissue beneath it. The infected area is usually red and  tender. Cellulitis occurs most often in the arms and lower legs.  CAUSES  Cellulitis is caused by bacteria that enter the skin through cracks or cuts in the skin. The most common types of bacteria that cause cellulitis are staphylococci and streptococci. SIGNS AND SYMPTOMS   Redness and warmth.  Swelling.  Tenderness or pain.  Fever. DIAGNOSIS  Your health care provider can usually determine what is wrong based on a physical exam. Blood tests may also be done. TREATMENT  Treatment usually involves taking an antibiotic medicine. HOME CARE INSTRUCTIONS   Take your antibiotic medicine as directed by your health care provider. Finish the antibiotic even if you start to feel better.  Keep the infected arm or leg elevated to reduce swelling.  Apply a warm cloth to the affected area up to 4 times per day to relieve pain.  Take medicines only as directed by your health care provider.  Keep all follow-up visits as directed by your health care provider. SEEK MEDICAL CARE IF:   You notice red streaks coming from the infected area.  Your red area gets larger or turns dark in color.  Your bone or joint underneath the infected area becomes painful after the skin has healed.  Your infection returns in the same area or another area.  You notice a swollen bump in the infected area.  You develop new symptoms.  You have a fever. SEEK IMMEDIATE MEDICAL CARE IF:   You feel very sleepy.  You develop vomiting or diarrhea.  You have a general ill feeling (malaise) with muscle aches and pains.   This information is not intended to replace advice given to you by your health care provider. Make sure you discuss any questions you have with your health care provider.   Document Released: 04/15/2005 Document Revised: 03/27/2015 Document Reviewed: 09/21/2011 Elsevier Interactive Patient Education 2016 [place] twice per day. If you have any new rashes on medication,  return here or emergency room. Okay to continue Zyrtec) itching, keep blister on foot covered and clean with soap and water. If the redness increases on your arm, you have fevers, or any worsening symptoms tomorrow, be seen in the emergency room.  As follow-up with me on Monday afternoon as we discussed.  I did refer you to a different dermatologist for second opinion.  Return to the clinic or go to the nearest emergency room if any of your symptoms worsen or new symptoms occur.       I personally performed the services described in this documentation, which was scribed in my presence. The recorded information has been reviewed and considered, and addended by me as needed.   Signed,   Merri Ray, MD [facility].  12/23/2015 11:29 AM

## 2015-12-21 NOTE — Patient Instructions (Addendum)
     IF you received an x-ray today, you will receive an invoice from [facility]. Please contact [facility] at [phone] with questions or concerns regarding your invoice.   IF you received labwork today, you will receive an invoice from [employer]. Please contact [facility] at [phone] with questions or concerns regarding your invoice.   Our billing staff will not be able to assist you with questions regarding bills from these companies.  You will be contacted with the lab results as soon as they are available. The fastest way to get your results is to activate your My Chart account. Instructions are located on the last page of this paperwork. If you have not heard from [place] regarding the results in 2 weeks, please contact this office.    Cellulitis Cellulitis is an infection of the skin and the tissue beneath it. The infected area is usually red and tender. Cellulitis occurs most often in the arms and lower legs.  CAUSES  Cellulitis is caused by bacteria that enter the skin through cracks or cuts in the skin. The most common types of bacteria that cause cellulitis are staphylococci and streptococci. SIGNS AND SYMPTOMS   Redness and warmth.  Swelling.  Tenderness or pain.  Fever. DIAGNOSIS  Your health care provider can usually determine what is wrong based on a physical exam. Blood tests may also be done. TREATMENT  Treatment usually involves taking an antibiotic medicine. HOME CARE INSTRUCTIONS   Take your antibiotic medicine as directed by your health care provider. Finish the antibiotic even if you start to feel better.  Keep the infected arm or leg elevated to reduce swelling.  Apply a warm cloth to the affected area up to 4 times per day to relieve pain.  Take medicines only as directed by your health care provider.  Keep all follow-up visits as directed by your health care provider. SEEK MEDICAL CARE IF:   You notice  red streaks coming from the infected area.  Your red area gets larger or turns dark in color.  Your bone or joint underneath the infected area becomes painful after the skin has healed.  Your infection returns in the same area or another area.  You notice a swollen bump in the infected area.  You develop new symptoms.  You have a fever. SEEK IMMEDIATE MEDICAL CARE IF:   You feel very sleepy.  You develop vomiting or diarrhea.  You have a general ill feeling (malaise) with muscle aches and pains.   This information is not intended to replace advice given to you by your health care provider. Make sure you discuss any questions you have with your health care provider.   Document Released: 04/15/2005 Document Revised: 03/27/2015 Document Reviewed: 09/21/2011 Elsevier Interactive Patient Education 2016 [place] twice per day. If you have any new rashes on medication, return here or emergency room. Okay to continue Zyrtec) itching, keep blister on foot covered and clean with soap and water. If the redness increases on your arm, you have fevers, or any worsening symptoms tomorrow, be seen in the emergency room.  As follow-up with me on Monday afternoon as we discussed.  I did refer you to a different dermatologist for second opinion.  Return to the clinic or go to the nearest emergency room if any of your symptoms worsen or new symptoms occur.

## 2015-12-23 LAB — WOUND CULTURE
Gram Stain: NONE SEEN
Gram Stain: NONE SEEN
Gram Stain: NONE SEEN
Organism ID, Bacteria: NO GROWTH

## 2015-12-26 ENCOUNTER — Encounter: Payer: Self-pay | Admitting: Internal Medicine

## 2015-12-31 LAB — RFLXH. SIMPLEX/VZ VIRUS CULT/DIF

## 2016-01-03 LAB — VIRAL CULTURE VIRC

## 2016-01-28 ENCOUNTER — Ambulatory Visit: Payer: Self-pay | Admitting: Internal Medicine

## 2016-02-07 ENCOUNTER — Ambulatory Visit: Payer: Self-pay | Admitting: Internal Medicine

## 2016-03-09 ENCOUNTER — Encounter: Payer: Medicare Other | Admitting: Gynecology

## 2016-04-17 ENCOUNTER — Encounter: Payer: Self-pay | Admitting: Family

## 2016-04-17 ENCOUNTER — Other Ambulatory Visit (INDEPENDENT_AMBULATORY_CARE_PROVIDER_SITE_OTHER): Payer: Medicare Other

## 2016-04-17 ENCOUNTER — Ambulatory Visit (INDEPENDENT_AMBULATORY_CARE_PROVIDER_SITE_OTHER): Payer: Medicare Other | Admitting: Family

## 2016-04-17 VITALS — BP 108/68 | HR 65 | Temp 98.1°F | Resp 16 | Ht 63.5 in | Wt 145.1 lb

## 2016-04-17 DIAGNOSIS — R221 Localized swelling, mass and lump, neck: Secondary | ICD-10-CM | POA: Insufficient documentation

## 2016-04-17 DIAGNOSIS — R1900 Intra-abdominal and pelvic swelling, mass and lump, unspecified site: Secondary | ICD-10-CM | POA: Insufficient documentation

## 2016-04-17 LAB — T4, FREE: FREE T4: 0.65 ng/dL (ref 0.60–1.60)

## 2016-04-17 LAB — TSH: TSH: 4.98 u[IU]/mL — AB (ref 0.35–4.50)

## 2016-04-17 MED ORDER — LORAZEPAM 0.5 MG PO TABS: 0.5000 mg | ORAL_TABLET | Freq: Every day | ORAL | 0 refills | Status: DC | PRN

## 2016-04-17 MED ORDER — HYOSCYAMINE SULFATE 0.125 MG PO TABS: 0.1250 mg | ORAL_TABLET | ORAL | 2 refills | Status: DC | PRN

## 2016-04-17 NOTE — Progress Notes (Signed)
Subjective:    Patient ID: Morgan [name], female    DOB: Oct 01, 1965, 50 y.o.   MRN: [id number]  Chief Complaint  Patient presents with  . Mass    has lumps on each side of her neck, not sure how long they have been there, has lost alot of weight     HPI:  Morgan [name] is a 50 y.o. female who  has a past medical history of Anxiety; Arthritis; Asthma; Bipolar disorder ([place]); Bulging lumbar disc (07/19/05); Chronic headaches; Colon polyps; Degenerative disorder of bone; Depression; History of borderline personality disorder; Hypothyroidism (2007); IBS (irritable bowel syndrome) (1995); Pneumonia; Proctitis; and Substance abuse. and presents today for an office visit.   1.) Lumps on neck - This is a new problem. Associated symptom of lumps located on bilateral sides of her neck have been going on for an unspecified time but was recently noted in the last week or so. Unsure if there is any changes in size. Denies heart palpitations, chest pain or shortness of breath. Has noted that she has lost weight recently. Denies fevers, chills or nightsweats. Does feel like a cold may be coming on.   Wt Readings from Last 3 Encounters:  04/17/16 145 lb 1.9 oz (65.8 kg)  12/21/15 163 lb (73.9 kg)  12/20/15 163 lb (73.9 kg)   2.) Mass of abdomen - This is a new problem. Associated symptom of a lump/mass located in the left lower quadrant has been going on for about 1-2 months. There is mild discomfort especially when walking. Denies any history or trauma or injury. Does not hurt at all times just on occasion. Denies pain with straining, coughing or sneezing.    Allergies  Allergen Reactions  . Abilify [Aripiprazole] Swelling    Throat begins to swell   . Ciprofloxacin Shortness Of Breath  . Lamictal [Lamotrigine] Rash  . Amoxicillin Hives  . Doxycycline Hives  . Latex Rash  . Meloxicam Other (See Comments)    Induces mania  . Mirapex [Pramipexole] Hives  . Prednisone Other (See Comments)   Interacts with Lithium. " All steroids"  . Risperdal [Risperidone] Hives  . Tape Rash    "steri strips"  . Cortisone     Exacerbates the mania of her bipolar  . Naproxen     Interacts with lithium.   Marland Kitchen Percocet [Oxycodone-Acetaminophen] Other (See Comments)    Severe dizziness  . Other Rash    Throat swelling and rash to WALNUTS and PINE NUTS  . Peanuts [Peanut Oil] Rash    Throat swelling  . Sulfa Antibiotics Rash      Outpatient Medications Prior to Visit  Medication Sig Dispense Refill  . albuterol (PROVENTIL HFA;VENTOLIN HFA) 108 (90 Base) MCG/ACT inhaler Inhale into the lungs every 6 (six) hours as needed for wheezing or shortness of breath.    . cephALEXin (KEFLEX) 500 MG capsule Take 1 capsule (500 mg total) by mouth 2 (two) times daily. 20 capsule 0  . doxepin (SINEQUAN) 25 MG capsule Reported on 10/22/2015  0  . fluticasone furoate-vilanterol (BREO ELLIPTA) 100-25 MCG/INH AEPB Inhale 1 puff into the lungs daily. 28 each 0  . hydrOXYzine (ATARAX/VISTARIL) 25 MG tablet take 1 tablet by mouth three times a day if needed for anxiety  0  . hyoscyamine (LEVSIN, ANASPAZ) 0.125 MG tablet Take 0.125 mg by mouth as needed for cramping. Reported on 09/24/2015    . LORazepam (ATIVAN) 0.5 MG tablet   0  . triamcinolone cream (KENALOG) 0.1 %  Apply 1 application topically 2 (two) times daily. 30 g 0  . trimethoprim-polymyxin b (POLYTRIM) ophthalmic solution Place 1 drop into the left eye every 4 (four) hours. Use for 7 days 10 mL 0   No facility-administered medications prior to visit.       Past Surgical History:  Procedure Laterality Date  . ABDOMINAL HYSTERECTOMY    . APPENDECTOMY    . BUNIONECTOMY Right 06/2012  . CHOLECYSTECTOMY    . SHOULDER OPEN ROTATOR CUFF REPAIR Right 03/2010  . TUBAL LIGATION        Past Medical History:  Diagnosis Date  . Anxiety   . Arthritis   . Asthma   . Bipolar disorder ([place])   . Bulging lumbar disc 07/19/05  . Chronic headaches   .  Colon polyps   . Degenerative disorder of bone   . Depression   . History of borderline personality disorder   . Hypothyroidism 2007   Developed after use of Lithium  . IBS (irritable bowel syndrome) 1995  . Pneumonia   . Proctitis   . Substance abuse    ativan last month      Review of Systems  Constitutional: Negative for chills, fatigue, fever and unexpected weight change.  HENT: Negative for sore throat.   Respiratory: Negative for chest tightness and shortness of breath.   Cardiovascular: Negative for chest pain, palpitations and leg swelling.  Endocrine: Negative for cold intolerance and heat intolerance.      Objective:    BP 108/68 (BP Location: Left Arm, Patient Position: Sitting, Cuff Size: Normal)   Pulse 65   Temp 98.1 F (36.7 C) (Oral)   Resp 16   Ht 5' 3.5" (1.613 m)   Wt 145 lb 1.9 oz (65.8 kg)   SpO2 98%   BMI 25.30 kg/m  Nursing note and vital signs reviewed.  Physical Exam  Constitutional: She is oriented to person, place, and time. She appears well-developed and well-nourished. No distress.  Cardiovascular: Normal rate, regular rhythm, normal heart sounds and intact distal pulses.   Pulmonary/Chest: Effort normal and breath sounds normal.  Abdominal: There is no hepatomegaly. There is no rigidity, no guarding, no tenderness at McBurney's point and negative Murphy's sign. A hernia (Questionable hernia in left lower quadrant) is present.  Lymphadenopathy:    She has cervical adenopathy.       Right cervical: Posterior cervical adenopathy present.       Left cervical: Posterior cervical adenopathy present.  Neurological: She is alert and oriented to person, place, and time.  Skin: Skin is warm and dry.  Psychiatric: She has a normal mood and affect. Her behavior is normal. Judgment and thought content normal.       Assessment & Plan:   Problem List Items Addressed This Visit      Other   Mass in neck    Mass in neck located along the  posterior cervical lymph chain bilaterally most likely mild lymphadenopathy although cannot rule out underlying malignancy. Obtain ultrasound for further assessment. Obtain TSH and T4. Follow-up pending ultrasound results.      Relevant Orders   US Soft Tissue Head/Neck   TSH   T4, free   Abdominal mass    Abdominal mass with concern for possible hernia versus muscle skeletal. Patient will self schedule appointment with general surgery for further assessment and evaluation. There is no evidence of strangulation or gangrene.       Other Visit Diagnoses   None.  I have discontinued Ms. Mcdaid's triamcinolone cream, doxepin, hydrOXYzine, trimethoprim-polymyxin b, fluticasone furoate-vilanterol, and cephALEXin. I have also changed her hyoscyamine and LORazepam. Additionally, I am having her maintain her albuterol.   Meds ordered this encounter  Medications  . hyoscyamine (LEVSIN, ANASPAZ) 0.125 MG tablet    Sig: Take 1 tablet (0.125 mg total) by mouth as needed for cramping. Reported on 09/24/2015    Dispense:  10 tablet    Refill:  2  . LORazepam (ATIVAN) 0.5 MG tablet    Sig: Take 1 tablet (0.5 mg total) by mouth daily as needed for anxiety.    Dispense:  30 tablet    Refill:  0     Follow-up: Return if symptoms worsen or fail to improve.  Mauricio Po, FNP

## 2016-04-17 NOTE — Patient Instructions (Signed)
Thank you for choosing [facility].  SUMMARY AND INSTRUCTIONS:  Medication:  Please continue to take her medications as prescribed  Your prescription(s) have been submitted to your pharmacy or been printed and provided for you. Please take as directed and contact our office if you believe you are having problem(s) with the medication(s) or have any questions.  Labs:  Please stop by the lab on the lower level of the building for your blood work. Your results will be released to [place] (or called to you) after review, usually within 72 hours after test completion. If any changes need to be made, you will be notified at that same time.  1.) The lab is open from 7:30am to 5:30 pm Monday-Friday 2.) No appointment is necessary 3.) Fasting (if needed) is 6-8 hours after food and drink; black coffee and water are okay   Imaging / Radiology:  They will call to schedule your ultrasound.   Referrals:  Recommend follow-up with general surgery for your abdomen.  Follow up:  If your symptoms worsen or fail to improve, please contact our office for further instruction, or in case of emergency go directly to the emergency room at the closest medical facility.    Lymphadenopathy Lymphadenopathy refers to swollen or enlarged lymph glands, also called lymph nodes. Lymph glands are part of your body's defense (immune) system, which protects the body from infections, germs, and diseases. Lymph glands are found in many locations in your body, including the neck, underarm, and groin.  Many things can cause lymph glands to become enlarged. When your immune system responds to germs, such as viruses or bacteria, infection-fighting cells and fluid build up. This causes the glands to grow in size. Usually, this is not something to worry about. The swelling and any soreness often go away without treatment. However, swollen lymph glands can also be caused by a number of diseases. Your health care  provider may do various tests to help determine the cause. If the cause of your swollen lymph glands cannot be found, it is important to monitor your condition to make sure the swelling goes away. HOME CARE INSTRUCTIONS Watch your condition for any changes. The following actions may help to lessen any discomfort you are feeling:  Get plenty of rest.  Take medicines only as directed by your health care provider. Your health care provider may recommend over-the-counter medicines for pain.  Apply moist heat compresses to the site of swollen lymph nodes as directed by your health care provider. This can help reduce any pain.  Check your lymph nodes daily for any changes.  Keep all follow-up visits as directed by your health care provider. This is important. SEEK MEDICAL CARE IF:  Your lymph nodes are still swollen after 2 weeks.  Your swelling increases or spreads to other areas.  Your lymph nodes are hard, seem fixed to the skin, or are growing rapidly.  Your skin over the lymph nodes is red and inflamed.  You have a fever.  You have chills.  You have fatigue.  You develop a sore throat.  You have abdominal pain.  You have weight loss.  You have night sweats. SEEK IMMEDIATE MEDICAL CARE IF:  You notice fluid leaking from the area of the enlarged lymph node.  You have severe pain in any area of your body.  You have chest pain.  You have shortness of breath.   This information is not intended to replace advice given to you by your health care  provider. Make sure you discuss any questions you have with your health care provider.   Document Released: 04/14/2008 Document Revised: 07/27/2014 Document Reviewed: 02/08/2014 Elsevier Interactive Patient Education [employer].

## 2016-04-17 NOTE — Assessment & Plan Note (Signed)
Abdominal mass with concern for possible hernia versus muscle skeletal. Patient will self schedule appointment with general surgery for further assessment and evaluation. There is no evidence of strangulation or gangrene.

## 2016-04-17 NOTE — Assessment & Plan Note (Signed)
Mass in neck located along the posterior cervical lymph chain bilaterally most likely mild lymphadenopathy although cannot rule out underlying malignancy. Obtain ultrasound for further assessment. Obtain TSH and T4. Follow-up pending ultrasound results.

## 2016-04-20 ENCOUNTER — Other Ambulatory Visit: Payer: Self-pay

## 2016-04-20 DIAGNOSIS — R221 Localized swelling, mass and lump, neck: Secondary | ICD-10-CM

## 2016-04-30 ENCOUNTER — Other Ambulatory Visit: Payer: Self-pay

## 2016-05-06 ENCOUNTER — Ambulatory Visit
Admission: RE | Admit: 2016-05-06 | Discharge: 2016-05-06 | Disposition: A | Discharge status: Home / Self Care | Payer: Medicare Other | Source: Ambulatory Visit | Attending: Family | Admitting: Family

## 2016-05-06 DIAGNOSIS — R221 Localized swelling, mass and lump, neck: Secondary | ICD-10-CM

## 2016-05-06 DIAGNOSIS — E042 Nontoxic multinodular goiter: Secondary | ICD-10-CM | POA: Diagnosis not present

## 2016-05-07 ENCOUNTER — Encounter: Payer: Self-pay | Admitting: Family

## 2016-05-25 DIAGNOSIS — H5203 Hypermetropia, bilateral: Secondary | ICD-10-CM | POA: Diagnosis not present

## 2016-05-25 DIAGNOSIS — H524 Presbyopia: Secondary | ICD-10-CM | POA: Diagnosis not present

## 2016-05-25 DIAGNOSIS — H52223 Regular astigmatism, bilateral: Secondary | ICD-10-CM | POA: Diagnosis not present

## 2016-06-18 ENCOUNTER — Ambulatory Visit: Payer: Self-pay | Admitting: Nurse Practitioner

## 2016-06-18 ENCOUNTER — Encounter: Payer: Self-pay | Admitting: Family Medicine

## 2016-06-18 ENCOUNTER — Ambulatory Visit (HOSPITAL_BASED_OUTPATIENT_CLINIC_OR_DEPARTMENT_OTHER)
Admission: RE | Admit: 2016-06-18 | Discharge: 2016-06-18 | Disposition: A | Discharge status: Home / Self Care | Payer: Medicare Other | Source: Ambulatory Visit | Attending: Family Medicine | Admitting: Family Medicine

## 2016-06-18 ENCOUNTER — Ambulatory Visit (INDEPENDENT_AMBULATORY_CARE_PROVIDER_SITE_OTHER): Payer: Medicare Other | Admitting: Family Medicine

## 2016-06-18 ENCOUNTER — Other Ambulatory Visit: Payer: Medicare Other

## 2016-06-18 VITALS — BP 110/76 | HR 63 | Temp 98.2°F | Resp 16 | Ht 63.5 in | Wt 148.6 lb

## 2016-06-18 DIAGNOSIS — J4530 Mild persistent asthma, uncomplicated: Secondary | ICD-10-CM

## 2016-06-18 DIAGNOSIS — F99 Mental disorder, not otherwise specified: Secondary | ICD-10-CM

## 2016-06-18 DIAGNOSIS — D729 Disorder of white blood cells, unspecified: Secondary | ICD-10-CM | POA: Diagnosis not present

## 2016-06-18 DIAGNOSIS — R0602 Shortness of breath: Secondary | ICD-10-CM | POA: Diagnosis not present

## 2016-06-18 DIAGNOSIS — R079 Chest pain, unspecified: Secondary | ICD-10-CM

## 2016-06-18 DIAGNOSIS — D72829 Elevated white blood cell count, unspecified: Secondary | ICD-10-CM

## 2016-06-18 DIAGNOSIS — R05 Cough: Secondary | ICD-10-CM | POA: Diagnosis not present

## 2016-06-18 LAB — CBC WITH DIFFERENTIAL/PLATELET
Basophils Absolute: 0.1 10*3/uL (ref 0.0–0.1)
Basophils Relative: 0.5 % (ref 0.0–3.0)
EOS PCT: 1.8 % (ref 0.0–5.0)
Eosinophils Absolute: 0.2 10*3/uL (ref 0.0–0.7)
HEMATOCRIT: 42.2 % (ref 36.0–46.0)
Hemoglobin: 14 g/dL (ref 12.0–15.0)
LYMPHS ABS: 7.4 10*3/uL — AB (ref 0.7–4.0)
MCHC: 33.1 g/dL (ref 30.0–36.0)
MCV: 85.1 fl (ref 78.0–100.0)
MONOS PCT: 5.4 % (ref 3.0–12.0)
Monocytes Absolute: 0.7 10*3/uL (ref 0.1–1.0)
NEUTROS ABS: 3.8 10*3/uL (ref 1.4–7.7)
NEUTROS PCT: 31.6 % — AB (ref 43.0–77.0)
PLATELETS: 225 10*3/uL (ref 150.0–400.0)
RBC: 4.96 Mil/uL (ref 3.87–5.11)
RDW: 13.5 % (ref 11.5–15.5)
WBC: 12.1 10*3/uL — ABNORMAL HIGH (ref 4.0–10.5)

## 2016-06-18 LAB — BASIC METABOLIC PANEL
BUN: 15 mg/dL (ref 6–23)
CO2: 30 meq/L (ref 19–32)
Calcium: 9.6 mg/dL (ref 8.4–10.5)
Chloride: 105 mEq/L (ref 96–112)
Creatinine, Ser: 0.91 mg/dL (ref 0.40–1.20)
GFR: 69.14 mL/min (ref >60.00)
GLUCOSE: 88 mg/dL (ref 70–99)
POTASSIUM: 4 meq/L (ref 3.5–5.1)
SODIUM: 141 meq/L (ref 135–145)

## 2016-06-18 LAB — D-DIMER, QUANTITATIVE (NOT AT ARMC): D DIMER QUANT: 0.19 ug{FEU}/mL (ref <0.50)

## 2016-06-18 NOTE — Patient Instructions (Addendum)
We recommend that you schedule a mammogram for breast cancer screening. Typically, you do not need a referral to do this. Please contact a local imaging center to schedule your mammogram.  [facility] - [phone]  *ask for the Radiology [place] [facility]) - [phone] or [phone]  [facility] - [phone] [place] [phone] [facility] - [phone]  *ask for the [facility] - [phone]  *ask for the Radiology Department [facility] - [phone]  *ask for the [place] - [phone]  Please go back on your Breo daily We will check your D dimer today- if this is elevated we will need to do a CT scan .  I will be in touch with your results later on today

## 2016-06-18 NOTE — Progress Notes (Signed)
Pre visit review using our clinic review tool, if applicable. No additional management support is needed unless otherwise documented below in the visit note. 

## 2016-06-18 NOTE — Progress Notes (Signed)
[place] at [facility] [address] [place], [place] [address] [id number]  Date:  06/18/2016   Name:  Morgan [name]   DOB:  [birth year]   MRN:  [id number]  PCP:  Lamar Blinks, MD    Chief Complaint: Shortness of Breath (for 2 weeks); bodyaches; Chest Pain; Cough (just started this week); Fatigue; and Wheezing   History of Present Illness:  Morgan [name] is a 50 y.o. very pleasant female patient who presents with the following:  Here today as a new patient to my practice.  She plans to transfer care to my office.  She has noted cough and wheezing, she is concerned that she might have pneumonia like she did last year. She notes that her chest "is really really really bad" but cannot give a more specific description. She has noted this for the last 2 weeks   She has not noted any fever.    She notes that she is not feeling as energetic as usual.  She usually walks 5-6 miles a day; however over the last week she has noted CP with walking and at rest.  She has walked less as she did not feel strong enough to do her usual amount.  However this was starting to make her feel anxious so yesterday she walked a lot.   She thinks that yesterday she walked 10 miles- she does not think that her CP was worsened by exercise yesterday.    She does not have any history of heart trouble.   She feels like "right now my chest is really tight from coughing."  She has a history of asthma but states she has not used her albuterol recently, has not felt like she needed it.  She also does have Breo but is not using this right now  She has a history of mental illness, was last hospitalized for about 5 days from 1/26 t 08/20/15 with mania and anxiety.  She had overused ativan in an attempt to control her sx of bipolar disorder. She was released to home with lithium, doxepin qhs prn and atarax prn.  It appears that since this time she has stopped seeing  psychiatry and she is no longer taking any of these medications.  She feels that she is allergic or intolerant to many of the available psychiatric medications and that they do not work for her in any case.   She is a former smoker History of asthma She does have some albuterol at home, and she does have Breo as well,.  She has not been using either medication lately as she has not felt like she needed them  Never had a DVT or PE, no hemoptysis, no history of cancer or immobilization, no calf pain or swelling    Patient Active Problem List   Diagnosis Date Noted  . Mass in neck 04/17/2016  . Abdominal mass 04/17/2016  . Skin lump of leg 12/12/2015  . Skin lump of arm 12/12/2015  . Left thigh pain 12/12/2015  . Asthma 12/12/2015  . Multiple environmental allergies 12/12/2015  . Rash and nonspecific skin eruption 09/19/2015  . Chronic lower back pain 09/12/2015  . Diarrhea 09/12/2015  . Joint pain 09/12/2015  . Bipolar 1 disorder, depressed ([place]) 08/27/2015    Class: Chronic  . Other specified hypothyroidism 08/23/2015  . Borderline personality disorder 08/15/2015  . Severe benzodiazepine use disorder ([place]) 08/15/2015  . Alcohol use disorder, moderate, dependence ([place]) 08/15/2015  .  PTSD (post-traumatic stress disorder) 08/15/2015  . History of bipolar disorder 08/15/2015  . Bronchopneumonia 07/10/2015  . Cough 06/19/2015  . Pre-syncope 05/17/2015  . Skull deformity 05/17/2015  . Headache 05/17/2015  . Patellofemoral syndrome of both knees 05/02/2015  . Knee pain, bilateral 03/05/2015    Past Medical History:  Diagnosis Date  . Anxiety   . Arthritis   . Asthma   . Bipolar disorder ([place])   . Bulging lumbar disc 07/19/05  . Chronic headaches   . Colon polyps   . Degenerative disorder of bone   . Depression   . History of borderline personality disorder   . Hypothyroidism 2007   Developed after use of Lithium  . IBS (irritable bowel syndrome) 1995  . Pneumonia   .  Proctitis   . Substance abuse    ativan last month    Past Surgical History:  Procedure Laterality Date  . ABDOMINAL HYSTERECTOMY    . APPENDECTOMY    . BUNIONECTOMY Right 06/2012  . CHOLECYSTECTOMY    . SHOULDER OPEN ROTATOR CUFF REPAIR Right 03/2010  . TUBAL LIGATION      Social History  Substance Use Topics  . Smoking status: Former Smoker    Quit date: 07/20/1980  . Smokeless tobacco: Never Used  . Alcohol use 0.0 oz/week     Comment: unsure    Family History  Problem Relation Age of Onset  . Depression Mother   . Hypertension Mother   . Post-traumatic stress disorder Sister   . Alcohol abuse Brother   . Drug abuse Brother   . Post-traumatic stress disorder Brother   . Thyroid disease Father   . Alzheimer's disease Father   . COPD Maternal Grandmother   . Heart disease Maternal Grandfather   . Arthritis Paternal Grandmother   . Diabetes Paternal Grandmother   . Depression Paternal Grandmother   . Alzheimer's disease Paternal Grandmother   . Heart disease Paternal Grandfather   . Coronary artery disease Paternal Grandfather   . Alcohol abuse Paternal Grandfather   . Colon cancer Neg Hx     Allergies  Allergen Reactions  . Abilify [Aripiprazole] Swelling    Throat begins to swell   . Ciprofloxacin Shortness Of Breath  . Lamictal [Lamotrigine] Rash  . Amoxicillin Hives  . Doxycycline Hives  . Latex Rash  . Meloxicam Other (See Comments)    Induces mania  . Mirapex [Pramipexole] Hives  . Prednisone Other (See Comments)    Interacts with Lithium. " All steroids"  . Risperdal [Risperidone] Hives  . Tape Rash    "steri strips"  . Cortisone     Exacerbates the mania of her bipolar  . Naproxen     Interacts with lithium.   Marland Kitchen Percocet [Oxycodone-Acetaminophen] Other (See Comments)    Severe dizziness  . Other Rash    Throat swelling and rash to WALNUTS and PINE NUTS  . Peanuts [Peanut Oil] Rash    Throat swelling  . Sulfa Antibiotics Rash     Medication list has been reviewed and updated.  Current Outpatient Prescriptions on File Prior to Visit  Medication Sig Dispense Refill  . albuterol (PROVENTIL HFA;VENTOLIN HFA) 108 (90 Base) MCG/ACT inhaler Inhale into the lungs every 6 (six) hours as needed for wheezing or shortness of breath.    . hyoscyamine (LEVSIN, ANASPAZ) 0.125 MG tablet Take 1 tablet (0.125 mg total) by mouth as needed for cramping. Reported on 09/24/2015 10 tablet 2  . LORazepam (ATIVAN) 0.5 MG tablet  Take 1 tablet (0.5 mg total) by mouth daily as needed for anxiety. 30 tablet 0   No current facility-administered medications on file prior to visit.     Review of Systems:  As per HPI- otherwise negative.   Physical Examination: Vitals:   06/18/16 1043  BP: 110/76  Pulse: 63  Resp: 16  Temp: 98.2 F (36.8 C)   Vitals:   06/18/16 1043  Weight: 148 lb 9.6 oz (67.4 kg)  Height: 5' 3.5" (1.613 m)   Body mass index is 25.91 kg/m. Ideal Body Weight: Weight in (lb) to have BMI = 25: 143.1  GEN: WDWN, NAD, Non-toxic, A & O x 3, looks very well, fit build HEENT: Atraumatic, Normocephalic. Neck supple. No masses, No LAD.  Bilateral TM wnl, oropharynx normal.  PEERL,EOMI.   Ears and Nose: No external deformity. CV: RRR, No M/G/R. No JVD. No thrill. No extra heart sounds. PULM: CTA B, no wheezes, crackles, rhonchi. No retractions. No resp. distress. No accessory muscle use.  I am able to reproduce chest pain by pressing on her chest wall  ABD: S, NT, ND, +BS. No rebound. No HSM. EXTR: No c/c/e NEURO Normal gait.  PSYCH: Normally interactive. Conversant. Not depressed or anxious appearing.  Calm demeanor.  At this time she does not display any sign of mania or other unusual affect  Dg Chest 2 View  Result Date: 06/18/2016 CLINICAL DATA:  50 year old female with shortness of breath, chest pain and cough for 1-2 weeks. Initial encounter. EXAM: CHEST  2 VIEW COMPARISON:  06/19/2015 and earlier. FINDINGS:  Larger lung volumes, remain normal. Normal cardiac size and mediastinal contours. Visualized tracheal air column is within normal limits. Previously-seen right lung base opacity in 2016 has resolved. No pneumothorax, pulmonary edema, pleural effusion or confluent pulmonary opacity. Stable cholecystectomy clips. No acute osseous abnormality identified. IMPRESSION: No acute cardiopulmonary abnormality. Electronically Signed   By: Genevie Ann M.D.   On: 06/18/2016 11:50   EKG: NSR, no ST elevation or depression or other concerning findings  Results for orders placed or performed in visit on 06/18/16  D-Dimer, Quantitative  Result Value Ref Range   D-Dimer, Quant 0.19 <0.50 mcg/mL FEU  Basic metabolic panel  Result Value Ref Range   Sodium 141 135 - 145 mEq/L   Potassium 4.0 3.5 - 5.1 mEq/L   Chloride 105 96 - 112 mEq/L   CO2 30 19 - 32 mEq/L   Glucose, Bld 88 70 - 99 mg/dL   BUN 15 6 - 23 mg/dL   Creatinine, Ser 0.91 0.40 - 1.20 mg/dL   Calcium 9.6 8.4 - 10.5 mg/dL   GFR 69.14 >60.00 mL/min  CBC with Differential/Platelet  Result Value Ref Range   WBC 12.1 (H) 4.0 - 10.5 K/uL   RBC 4.96 3.87 - 5.11 Mil/uL   Hemoglobin 14.0 12.0 - 15.0 g/dL   HCT 42.2 36.0 - 46.0 %   MCV 85.1 78.0 - 100.0 fl   MCHC 33.1 30.0 - 36.0 g/dL   RDW 13.5 11.5 - 15.5 %   Platelets 225.0 150.0 - 400.0 K/uL   Neutrophils Relative % 31.6 (L) 43.0 - 77.0 %   Lymphocytes Relative 60.7 Repeated and verified X2. (H) 12.0 - 46.0 %   Monocytes Relative 5.4 3.0 - 12.0 %   Eosinophils Relative 1.8 0.0 - 5.0 %   Basophils Relative 0.5 0.0 - 3.0 %   Neutro Abs 3.8 1.4 - 7.7 K/uL   Lymphs Abs 7.4 (H) 0.7 -  4.0 K/uL   Monocytes Absolute 0.7 0.1 - 1.0 K/uL   Eosinophils Absolute 0.2 0.0 - 0.7 K/uL   Basophils Absolute 0.1 0.0 - 0.1 K/uL    Assessment and Plan: Shortness of breath - Plan: DG Chest 2 View, EKG 12-Lead, D-Dimer, Quantitative, Basic metabolic panel, CBC with Differential/Platelet, Ambulatory referral to  Pulmonology  Chest pain, unspecified type - Plan: DG Chest 2 View, EKG 12-Lead  Chronic mental illness  Mild persistent asthma without complication - Plan: Ambulatory referral to Pulmonology  Leukocytosis, unspecified type - Plan: Ambulatory referral to Hematology   Here today as a new patient to my practice.  She has noted shortness of breath and a vague chest discomfort for the last 14 days.  However she is still able to exercise and yesterday she walked approx 10 miles over the course of the day.  EKG and CXR are reassuring.  She does have a history of asthma and would like to be referred back to pulmonology- this is certainly reasonable.  Suspect that she is having a flare of her asthma sx.  Encouraged her to go back on her Breo, and to use albuterol as needed for SOB.  Avoid oral steroids in this pt with a history of bipolar disorder.  Also discussed possibility of a PE; low suspicion, so decided on a D dimer instead of CT.  D dimer is negative Also explained to her that I cannot rule out a cardiac or other etiology of her pain here in clinic.  Offered to have her seen in the ER for further evaluation.  She declines but will seek care if any worsening Noted persistent leukocytosis over the last 9 months.  Offered a hematology referral and she would like to do this.   Discussed her mental illness history.  For the time being she appears to be stable and thinking clearly.  However explained to her that her bipolar disorder is beyond the scope of my practice and I will not be able to manage her mental health meds.  Encouraged her to get a psychiatrist so she has someone to help her in the case of worsening.  She will think about this  She is advised to seek care right away if getting worse.  I will send her a mychart message so she can reply to me in the case of any concerns   Signed Lamar Blinks, MD

## 2016-06-19 LAB — PATHOLOGIST SMEAR REVIEW

## 2016-06-22 MED ORDER — ALBUTEROL SULFATE HFA 108 (90 BASE) MCG/ACT IN AERS: 1.0000 | INHALATION_SPRAY | Freq: Four times a day (QID) | RESPIRATORY_TRACT | 3 refills | Status: DC | PRN

## 2016-07-03 ENCOUNTER — Telehealth: Payer: Self-pay | Admitting: Family Medicine

## 2016-07-03 NOTE — Telephone Encounter (Signed)
LM for [place] at [facility] to call and schedule pt

## 2016-07-03 NOTE — Telephone Encounter (Signed)
Informed patient we reached out to them to schedule an appointment. Patient has appointment scheduled.

## 2016-07-03 NOTE — Telephone Encounter (Signed)
Patient called stating that last time she was seen she was told she would be referred to a hematologist. She has not heard from them yet. Please advise   Patient phone: [phone]

## 2016-07-10 ENCOUNTER — Institutional Professional Consult (permissible substitution): Payer: Self-pay | Admitting: Internal Medicine

## 2016-07-31 ENCOUNTER — Other Ambulatory Visit (HOSPITAL_COMMUNITY)
Admission: RE | Admit: 2016-07-31 | Discharge: 2016-07-31 | Disposition: A | Discharge status: Home / Self Care | Payer: Medicare Other | Source: Ambulatory Visit | Attending: Hematology & Oncology | Admitting: Hematology & Oncology

## 2016-07-31 ENCOUNTER — Ambulatory Visit: Payer: Medicare Other

## 2016-07-31 ENCOUNTER — Encounter: Payer: Self-pay | Admitting: Hematology & Oncology

## 2016-07-31 ENCOUNTER — Other Ambulatory Visit (HOSPITAL_BASED_OUTPATIENT_CLINIC_OR_DEPARTMENT_OTHER): Payer: Medicare Other

## 2016-07-31 ENCOUNTER — Ambulatory Visit (HOSPITAL_BASED_OUTPATIENT_CLINIC_OR_DEPARTMENT_OTHER): Payer: Medicare Other | Admitting: Hematology & Oncology

## 2016-07-31 VITALS — BP 109/48 | HR 63 | Temp 98.4°F | Resp 16 | Wt 148.0 lb

## 2016-07-31 DIAGNOSIS — C9110 Chronic lymphocytic leukemia of B-cell type not having achieved remission: Secondary | ICD-10-CM | POA: Diagnosis not present

## 2016-07-31 DIAGNOSIS — D7289 Other specified disorders of white blood cells: Secondary | ICD-10-CM | POA: Diagnosis not present

## 2016-07-31 DIAGNOSIS — D72820 Lymphocytosis (symptomatic): Secondary | ICD-10-CM | POA: Diagnosis not present

## 2016-07-31 HISTORY — DX: Chronic lymphocytic leukemia of B-cell type not having achieved remission: C91.10

## 2016-07-31 LAB — CBC WITH DIFFERENTIAL (CANCER CENTER ONLY)
BASO#: 0.1 10*3/uL (ref 0.0–0.2)
BASO%: 0.6 % (ref 0.0–2.0)
EOS ABS: 0.2 10*3/uL (ref 0.0–0.5)
EOS%: 2.1 % (ref 0.0–7.0)
HEMATOCRIT: 43.8 % (ref 34.8–46.6)
HGB: 14.7 g/dL (ref 11.6–15.9)
LYMPH#: 5.6 10*3/uL — AB (ref 0.9–3.3)
LYMPH%: 54.1 % — AB (ref 14.0–48.0)
MCH: 28.7 pg (ref 26.0–34.0)
MCHC: 33.6 g/dL (ref 32.0–36.0)
MCV: 86 fL (ref 81–101)
MONO#: 1.4 10*3/uL — AB (ref 0.1–0.9)
MONO%: 13.8 % — ABNORMAL HIGH (ref 0.0–13.0)
NEUT#: 3.1 10*3/uL (ref 1.5–6.5)
NEUT%: 29.4 % — ABNORMAL LOW (ref 39.6–80.0)
PLATELETS: 215 10*3/uL (ref 145–400)
RBC: 5.12 10*6/uL (ref 3.70–5.32)
RDW: 13.4 % (ref 11.1–15.7)
WBC: 10.4 10*3/uL — ABNORMAL HIGH (ref 3.9–10.0)

## 2016-07-31 LAB — CHCC SATELLITE - SMEAR

## 2016-07-31 LAB — TECHNOLOGIST REVIEW CHCC SATELLITE: Tech Review: 20

## 2016-07-31 NOTE — Progress Notes (Signed)
Referral MD  Reason for Referral: Mild lymphocytosis   Chief Complaint  Patient presents with  . New Evaluation  : My white blood cell count is high.  HPI: Morgan [name] is a very nice 51 year old white female. She is originally from [place]. She has lived in different parts of the country as she was married to a Nature conservation officer man. She is now separated. She has been in the [place] area for about 5 years.  She is followed by Dr. Lorelei Pont. She has noted that Morgan [name]'s white cell count has been elevated. This is been noted for a while. Going back to August 2016, her white cell count was 10.6. Her hemoglobin was 15.2. Platelet count 262,000. She had 51% lymphocytes.  In February 2017, her white cell count was 16.1. Hemoglobin 14.4. Platelet count 2 or 67,000. She had 39% lymphocytes.  In June 2017, her white cell count was 12.1. In November of this past year, her white cell count 12.1 with 61% lymphocytes.  Because of this, it was felt that Morgan [name] needed a hematology consultation.  She has noted a lymph node in the posterior portion of the left neck. She said this is slightly tender. She said that she has had an ultrasound. This was done in October. The ultrasound report showed unremarkable lymph nodes.  She's not noted any lymph nodes anywhere else. She says that she has some fullness down the pelvis. She has had a hysterectomy.  She's had multiple surgeries.  She does not smoke. She does not drink. She is very much into natural remedies. She currently is on a detox program with smoothies.  She has had no weight loss or weight gain.  There is no history of blood problems in the family.  Overall, her performance status is ECOG 0.    Past Medical History:  Diagnosis Date  . Anxiety   . Arthritis   . Asthma   . Bipolar disorder ([place])   . Bulging lumbar disc 07/19/05  . Chronic headaches   . Chronic lymphocytic leukemia ([place]) 07/31/2016  . Colon polyps   . Degenerative disorder  of bone   . Depression   . History of borderline personality disorder   . Hypothyroidism 2007   Developed after use of Lithium  . IBS (irritable bowel syndrome) 1995  . Pneumonia   . Proctitis   . Substance abuse    ativan last month  :  Past Surgical History:  Procedure Laterality Date  . ABDOMINAL HYSTERECTOMY    . APPENDECTOMY    . BUNIONECTOMY Right 06/2012  . CHOLECYSTECTOMY    . SHOULDER OPEN ROTATOR CUFF REPAIR Right 03/2010  . TUBAL LIGATION    :   Current Outpatient Prescriptions:  .  albuterol (PROVENTIL HFA;VENTOLIN HFA) 108 (90 Base) MCG/ACT inhaler, Inhale 1-2 puffs into the lungs every 6 (six) hours as needed for wheezing or shortness of breath., Disp: 1 each, Rfl: 3 .  fluticasone furoate-vilanterol (BREO ELLIPTA) 100-25 MCG/INH AEPB, Inhale 1 puff into the lungs daily., Disp: , Rfl:  .  hyoscyamine (LEVSIN, ANASPAZ) 0.125 MG tablet, Take 1 tablet (0.125 mg total) by mouth as needed for cramping. Reported on 09/24/2015, Disp: 10 tablet, Rfl: 2 .  LORazepam (ATIVAN) 0.5 MG tablet, Take 1 tablet (0.5 mg total) by mouth daily as needed for anxiety., Disp: 30 tablet, Rfl: 0:  :  Allergies  Allergen Reactions  . Abilify [Aripiprazole] Swelling    Throat begins to swell   . Ciprofloxacin Shortness Of  Breath  . Lamictal [Lamotrigine] Rash  . Amoxicillin Hives  . Doxycycline Hives  . Latex Rash  . Meloxicam Other (See Comments)    Induces mania  . Mirapex [Pramipexole] Hives  . Prednisone Other (See Comments)    Interacts with Lithium. " All steroids"  . Risperdal [Risperidone] Hives  . Tape Rash    "steri strips"  . Cortisone     Exacerbates the mania of her bipolar  . Naproxen     Interacts with lithium.   Marland Kitchen Percocet [Oxycodone-Acetaminophen] Other (See Comments)    Severe dizziness  . Other Rash    Throat swelling and rash to WALNUTS and PINE NUTS  . Peanuts [Peanut Oil] Rash    Throat swelling  . Sulfa Antibiotics Rash  :  Family History  Problem  Relation Age of Onset  . Depression Mother   . Hypertension Mother   . Post-traumatic stress disorder Sister   . Alcohol abuse Brother   . Drug abuse Brother   . Post-traumatic stress disorder Brother   . Thyroid disease Father   . Alzheimer's disease Father   . COPD Maternal Grandmother   . Heart disease Maternal Grandfather   . Arthritis Paternal Grandmother   . Diabetes Paternal Grandmother   . Depression Paternal Grandmother   . Alzheimer's disease Paternal Grandmother   . Heart disease Paternal Grandfather   . Coronary artery disease Paternal Grandfather   . Alcohol abuse Paternal Grandfather   . Colon cancer Neg Hx   :  Social History   Social History  . Marital status: Divorced    Spouse name: N/A  . Number of children: 2  . Years of education: 14   Occupational History  . Unemployed    Social History Main Topics  . Smoking status: Former Smoker    Quit date: 07/20/1980  . Smokeless tobacco: Never Used  . Alcohol use 0.0 oz/week     Comment: unsure  . Drug use:     Types: Benzodiazepines  . Sexual activity: No     Comment: intercourse age unknown,sexual partners less than 5   Other Topics Concern  . Not on file   Social History Narrative   Fun: [place], travel, music, writing, walking, hiking, volunteering   Denies religious beliefs effecting health care.    Feels safe at home.   :  Pertinent items are noted in HPI.  Exam: _0 @ Well-developed and well-nourished White female in no obvious distress. Vital signs are talked to her of 98.4. Pulse 63. Blood pressure 109/48. Weight is 148 pounds. Head and neck exam shows no ocular or oral lesions. She does have a palpable posterior cervical lymph node in the left neck. This is finally tender. It probably measures about 1.5 cm. It is mobile. There might be a small, less than 1 cm, lymph node in the left supraclavicular region. I cannot palpate any right cervical lymph nodes. Lungs are clear. Cardiac exam  regular rate and rhythm with no murmurs, rubs or bruits. Abdomen is soft. She is good bowel sounds. There is no fluid wave. There is no palpable liver or spleen tip. Axillary exam shows a possible small, less than 1 cm, lymph node in the left axilla. Back exam shows no tenderness over the spine, ribs or hips. Extremities shows no clubbing, cyanosis or edema. Skin exam shows scars on her forearms. Neurological exam shows no focal neurological deficits.   Recent Labs  07/31/16 1034  WBC 10.4*  HGB 14.7  HCT  43.8  PLT 215   No results for input(s): NA, K, CL, CO2, GLUCOSE, BUN, CREATININE, CALCIUM in the last 72 hours.  Blood smear review:  Pending  Pathology: None     Assessment and Plan: Ms. Shearman is a 51 year old white female. She has minimal lymphocytosis. She has the left cervical lymph node. I have a suspicion that she might have CLL. I did send off for cytometry on her blood to see if there is a monoclonal population of white blood cells.  If she does have CLL, I just do not see that we have to treat her. She does have the left cervical lymph node. However, this is small. I do not think that this really is symptomatic that she would need to be treated.  We talked about CLL. She has been on the Internet. She really would prefer not have any treatment. She really wants to do a natural approach to therapy if possible. I would have no problems with this.  I spent about an hour with her. I answered all of her questions. I tried to reassure her that even if she had CLL, she can live with this for many years and never need to be treated.  If she does have CLL, I probably would get scans on her. I don't think we would have to do a bone marrow biopsy.  We will plan to get her back depending on the flow cytometry.

## 2016-08-03 NOTE — Addendum Note (Signed)
Addended by: Burney Gauze R on: 08/03/2016 05:15 PM   Modules accepted: Orders

## 2016-08-06 LAB — FLOW CYTOMETRY - CHCC SATELLITE

## 2016-08-25 ENCOUNTER — Telehealth: Payer: Self-pay | Admitting: Family Medicine

## 2016-08-25 NOTE — Telephone Encounter (Signed)
Pt says that she just received a cancerous Dx and is unsure of how she need to go about things. Pt express a great appreciation to Dr. Lorelei Pont for helping her. (referring her to Dr. Marin Olp and noticing change in her labs) Pt says that she's not sure of what steps she need to take going forward.   Pt says that she would feel so much better speaking to Dr. Lorelei Pont to see what steps she should take next. If provider could please call her back at : [phone]

## 2016-08-25 NOTE — Telephone Encounter (Signed)
Pt says that she is due a mammogram. She would like to have orders placed.

## 2016-08-26 ENCOUNTER — Other Ambulatory Visit: Payer: Self-pay | Admitting: Emergency Medicine

## 2016-08-26 DIAGNOSIS — Z1239 Encounter for other screening for malignant neoplasm of breast: Secondary | ICD-10-CM

## 2016-08-26 NOTE — Telephone Encounter (Signed)
Called pt back and discussed her CLL  She would like to see me for a couple of other concerns which I am glad to do

## 2016-08-26 NOTE — Telephone Encounter (Signed)
Order placed

## 2016-08-27 NOTE — Telephone Encounter (Signed)
Pt has been scheduled.  °

## 2016-09-01 ENCOUNTER — Encounter (HOSPITAL_BASED_OUTPATIENT_CLINIC_OR_DEPARTMENT_OTHER): Payer: Self-pay

## 2016-09-01 ENCOUNTER — Ambulatory Visit (HOSPITAL_BASED_OUTPATIENT_CLINIC_OR_DEPARTMENT_OTHER)
Admission: RE | Admit: 2016-09-01 | Discharge: 2016-09-01 | Disposition: A | Discharge status: Home / Self Care | Payer: Medicare Other | Source: Ambulatory Visit | Attending: Family Medicine | Admitting: Family Medicine

## 2016-09-01 DIAGNOSIS — Z1231 Encounter for screening mammogram for malignant neoplasm of breast: Secondary | ICD-10-CM | POA: Diagnosis not present

## 2016-09-01 DIAGNOSIS — Z1239 Encounter for other screening for malignant neoplasm of breast: Secondary | ICD-10-CM

## 2016-09-04 ENCOUNTER — Ambulatory Visit (HOSPITAL_BASED_OUTPATIENT_CLINIC_OR_DEPARTMENT_OTHER): Payer: Medicare Other | Admitting: Hematology & Oncology

## 2016-09-04 ENCOUNTER — Other Ambulatory Visit (HOSPITAL_BASED_OUTPATIENT_CLINIC_OR_DEPARTMENT_OTHER): Payer: Medicare Other

## 2016-09-04 VITALS — BP 115/65 | HR 65 | Temp 98.2°F | Wt 149.5 lb

## 2016-09-04 DIAGNOSIS — C9110 Chronic lymphocytic leukemia of B-cell type not having achieved remission: Secondary | ICD-10-CM

## 2016-09-04 DIAGNOSIS — D72820 Lymphocytosis (symptomatic): Secondary | ICD-10-CM | POA: Diagnosis not present

## 2016-09-04 LAB — COMPREHENSIVE METABOLIC PANEL
ALT: 14 U/L (ref 0–55)
ANION GAP: 9 meq/L (ref 3–11)
AST: 20 U/L (ref 5–34)
Albumin: 4 g/dL (ref 3.5–5.0)
Alkaline Phosphatase: 54 U/L (ref 40–150)
BUN: 13.4 mg/dL (ref 7.0–26.0)
CHLORIDE: 108 meq/L (ref 98–109)
CO2: 25 mEq/L (ref 22–29)
CREATININE: 0.8 mg/dL (ref 0.6–1.1)
Calcium: 9.7 mg/dL (ref 8.4–10.4)
EGFR: 82 mL/min/{1.73_m2} — ABNORMAL LOW (ref >90)
Glucose: 59 mg/dl — ABNORMAL LOW (ref 70–140)
Potassium: 4 mEq/L (ref 3.5–5.1)
Sodium: 142 mEq/L (ref 136–145)
Total Bilirubin: 0.39 mg/dL (ref 0.20–1.20)
Total Protein: 6.6 g/dL (ref 6.4–8.3)

## 2016-09-04 LAB — CBC WITH DIFFERENTIAL (CANCER CENTER ONLY)
BASO#: 0.1 10*3/uL (ref 0.0–0.2)
BASO%: 0.6 % (ref 0.0–2.0)
EOS ABS: 0.3 10*3/uL (ref 0.0–0.5)
EOS%: 2.2 % (ref 0.0–7.0)
HEMATOCRIT: 43.3 % (ref 34.8–46.6)
HEMOGLOBIN: 14.3 g/dL (ref 11.6–15.9)
LYMPH#: 6.9 10*3/uL — AB (ref 0.9–3.3)
LYMPH%: 59.4 % — ABNORMAL HIGH (ref 14.0–48.0)
MCH: 28.4 pg (ref 26.0–34.0)
MCHC: 33 g/dL (ref 32.0–36.0)
MCV: 86 fL (ref 81–101)
MONO#: 1.7 10*3/uL — AB (ref 0.1–0.9)
MONO%: 14.8 % — ABNORMAL HIGH (ref 0.0–13.0)
NEUT%: 23 % — ABNORMAL LOW (ref 39.6–80.0)
NEUTROS ABS: 2.7 10*3/uL (ref 1.5–6.5)
Platelets: 236 10*3/uL (ref 145–400)
RBC: 5.03 10*6/uL (ref 3.70–5.32)
RDW: 14.1 % (ref 11.1–15.7)
WBC: 11.6 10*3/uL — AB (ref 3.9–10.0)

## 2016-09-04 LAB — TECHNOLOGIST REVIEW CHCC SATELLITE: Tech Review: 18

## 2016-09-04 LAB — CHCC SATELLITE - SMEAR

## 2016-09-04 LAB — LACTATE DEHYDROGENASE: LDH: 190 U/L (ref 125–245)

## 2016-09-04 NOTE — Progress Notes (Signed)
Hematology and Oncology Follow Up Visit  Morgan [name] [id number] 1966/02/14 51 y.o. 09/04/2016   Principle Diagnosis:   CLL - Rai stage I  Current Therapy:    Observation     Interim History:  Morgan [name] is back for follow-up. This is her second office visit. We'll first saw her, we did do flow cytometry on her peripheral blood. The pathology report [id number]) showed a monoclonal population of cells that was consistent with CLL.  We have not yet done any kind of staging studies as she really has been asymptomatic. She does have some occasional swollen cervical lymph nodes. This is how she initially presented.  She comes in with one of her friends. Her daughter was listening in on the computer.  She has had no problems with fever. She's had no sweats. There's been no problems with infections. She has had no change in bowel or bladder habits.  She says that she has noted some suboccipital lymph nodes on the right side of her neck. She says she noted these a few days ago.  She's had no rashes. She's had no bleeding.  Her appetite has been good. She's had no nausea or vomiting.  Overall, I would say that her performance status is ECOG 0.  Medications:  Current Outpatient Prescriptions:  .  albuterol (PROVENTIL HFA;VENTOLIN HFA) 108 (90 Base) MCG/ACT inhaler, Inhale 1-2 puffs into the lungs every 6 (six) hours as needed for wheezing or shortness of breath., Disp: 1 each, Rfl: 3 .  fluticasone furoate-vilanterol (BREO ELLIPTA) 100-25 MCG/INH AEPB, Inhale 1 puff into the lungs daily., Disp: , Rfl:  .  hyoscyamine (LEVSIN, ANASPAZ) 0.125 MG tablet, Take 1 tablet (0.125 mg total) by mouth as needed for cramping. Reported on 09/24/2015, Disp: 10 tablet, Rfl: 2 .  LORazepam (ATIVAN) 0.5 MG tablet, Take 1 tablet (0.5 mg total) by mouth daily as needed for anxiety., Disp: 30 tablet, Rfl: 0  Allergies:  Allergies  Allergen Reactions  . Abilify [Aripiprazole] Swelling    Throat begins to  swell   . Ciprofloxacin Shortness Of Breath  . Lamictal [Lamotrigine] Rash  . Amoxicillin Hives  . Doxycycline Hives  . Latex Rash  . Meloxicam Other (See Comments)    Induces mania  . Mirapex [Pramipexole] Hives  . Prednisone Other (See Comments)    Interacts with Lithium. " All steroids"  . Risperdal [Risperidone] Hives  . Tape Rash    "steri strips"  . Cortisone     Exacerbates the mania of her bipolar  . Naproxen     Interacts with lithium.   Marland Kitchen Percocet [Oxycodone-Acetaminophen] Other (See Comments)    Severe dizziness  . Other Rash    Throat swelling and rash to WALNUTS and PINE NUTS  . Peanuts [Peanut Oil] Rash    Throat swelling  . Sulfa Antibiotics Rash    Past Medical History, Surgical history, Social history, and Family History were reviewed and updated.  Review of Systems: As above  Physical Exam:  weight is 149 lb 8 oz (67.8 kg). Her oral temperature is 98.2 F (36.8 C). Her blood pressure is 115/65 and her pulse is 65.   Wt Readings from Last 3 Encounters:  09/04/16 149 lb 8 oz (67.8 kg)  07/31/16 148 lb (67.1 kg)  06/18/16 148 lb 9.6 oz (67.4 kg)     Well-developed and well-nourished white female in no obvious distress. Head and neck exam shows no ocular or oral lesions. She has some mild lymphadenopathy in the  posterior cervical region. Lymph nodes are less than 1 cm and mobile. I really do not appreciate much in the way of suboccipital lymphadenopathy on the right side. Her lungs are clear to percussion and auscultation bilaterally. Cardiac exam regular rate and rhythm with no murmurs, rubs or bruits. Abdomen is soft. She has good bowel sounds. There is no fluid wave. There is no palpable liver or spleen tip. Axillary exam shows small-less than 1 cm-lymph nodes in the left axilla. There might be 1 some similar lymph node in the right axilla. Back exam shows no tenderness over the spine, ribs or hips. Extremities shows no clubbing, cyanosis or edema. Skin  exam shows no rashes, ecchymoses or petechia. Neurological exam shows no focal neurological deficits.  Lab Results  Component Value Date   WBC 11.6 (H) 09/04/2016   HGB 14.3 09/04/2016   HCT 43.3 09/04/2016   MCV 86 09/04/2016   PLT 236 09/04/2016     Chemistry      Component Value Date/Time   NA 142 09/04/2016 0754   K 4.0 09/04/2016 0754   CL 105 06/18/2016 1216   CO2 25 09/04/2016 0754   BUN 13.4 09/04/2016 0754   CREATININE 0.8 09/04/2016 0754      Component Value Date/Time   CALCIUM 9.7 09/04/2016 0754   ALKPHOS 54 09/04/2016 0754   AST 20 09/04/2016 0754   ALT 14 09/04/2016 0754   BILITOT 0.39 09/04/2016 0754         Impression and Plan: Morgan [name] is a 51 year old white female. She has early stage CLL. She does not need to have any therapy right now.  I tried to impress upon her the fact that she could have this stable situation for many years. It is hard to say whether not she will progress and when.  We will at least get some baseline CT scans. I think this would be reasonable. She does not need a bone marrow test.  She asked me quite a few questions. She wants to try natural approaches. I have no problem with this. I don't see that using supplements would affect CLL in a negative way.  I think we can get her back in 3 months. I told her that I probably would follow her along every 3 months for the first year and then everything is relatively stable and we can go to every 4-6 month follow-up.  I told her that it is very likely that she would need treatment at some point in the future just because of her young age. She is certainly on the young side for CLL. However, I have not yet seen anything that looks as if the CLL will be malignant or aggressive.  I spent about 45 minutes with she, her friend and her daughter on the computer. I wanted to make sure that everybody understood what was going on. I know that she gets on the Internet and starts going to these  Internet sites and looks at what information is on the sites. I warned her that the sites might have unreliable information that she might be adversely affected by. I told her that if she ever has any questions that she [place]'s call us and we will call her back.  We'll get the CT scans in about 1 week or so. I will then see her back in 3 months.   Volanda Napoleon, MD 2/16/20181:33 PM

## 2016-09-05 ENCOUNTER — Encounter (HOSPITAL_BASED_OUTPATIENT_CLINIC_OR_DEPARTMENT_OTHER): Payer: Self-pay

## 2016-09-05 ENCOUNTER — Ambulatory Visit (HOSPITAL_BASED_OUTPATIENT_CLINIC_OR_DEPARTMENT_OTHER)
Admission: RE | Admit: 2016-09-05 | Discharge: 2016-09-05 | Disposition: A | Discharge status: Home / Self Care | Payer: Medicare Other | Source: Ambulatory Visit | Attending: Hematology & Oncology | Admitting: Hematology & Oncology

## 2016-09-05 DIAGNOSIS — R109 Unspecified abdominal pain: Secondary | ICD-10-CM | POA: Diagnosis not present

## 2016-09-05 DIAGNOSIS — C9110 Chronic lymphocytic leukemia of B-cell type not having achieved remission: Secondary | ICD-10-CM | POA: Diagnosis not present

## 2016-09-05 DIAGNOSIS — C9111 Chronic lymphocytic leukemia of B-cell type in remission: Secondary | ICD-10-CM | POA: Diagnosis not present

## 2016-09-05 DIAGNOSIS — R918 Other nonspecific abnormal finding of lung field: Secondary | ICD-10-CM | POA: Insufficient documentation

## 2016-09-05 LAB — BETA 2 MICROGLOBULIN, SERUM: Beta-2: 1.7 mg/L (ref 0.6–2.4)

## 2016-09-05 MED ORDER — IOPAMIDOL (ISOVUE-300) INJECTION 61%
100.0000 mL | Freq: Once | INTRAVENOUS | Status: AC | PRN
  Administered 2016-09-05: 100 mL via INTRAVENOUS

## 2016-09-07 ENCOUNTER — Ambulatory Visit (INDEPENDENT_AMBULATORY_CARE_PROVIDER_SITE_OTHER): Payer: Medicare Other | Admitting: Family Medicine

## 2016-09-07 ENCOUNTER — Encounter: Payer: Self-pay | Admitting: Family Medicine

## 2016-09-07 VITALS — BP 110/73 | HR 70 | Temp 98.6°F | Ht 64.0 in | Wt 149.8 lb

## 2016-09-07 DIAGNOSIS — J4530 Mild persistent asthma, uncomplicated: Secondary | ICD-10-CM

## 2016-09-07 DIAGNOSIS — R0602 Shortness of breath: Secondary | ICD-10-CM | POA: Diagnosis not present

## 2016-09-07 DIAGNOSIS — C9110 Chronic lymphocytic leukemia of B-cell type not having achieved remission: Secondary | ICD-10-CM

## 2016-09-07 NOTE — Patient Instructions (Addendum)
We are going to have you see pulmonology to check on your breathing Start taking a daily claritin or zyrtec to see if it will help We will upload your living well/ POA into your chart

## 2016-09-07 NOTE — Progress Notes (Signed)
Pre visit review using our clinic review tool, if applicable. No additional management support is needed unless otherwise documented below in the visit note. 

## 2016-09-07 NOTE — Progress Notes (Signed)
Morgan [name], Morgan [name], Morgan [id number] [phone]  Date:  09/07/2016   Name:  Morgan [name]   DOB:  05-Apr-1966   MRN:  [id number]  PCP:  Lamar [name], Morgan [name] to discuss what her next steps should be. )   History of Present Illness:  Morgan [name] is a 51 y.o. very pleasant female patient who presents with the following:  She was dx with CLL last month- stage 1, observation for the time being.  She is seeing Dr. Marin Olp- reviewed his note from 2/16 She does notice some nodes in her neck that can bother her at times- they can be tender and feel enlarged  We did further evaluation based on mild increase in her WBC count She did have a CT scan over the weekend- stable pulmonary nodule, some lymphadenopathy  She would [name] for [place] to put her HCPOA in her chart- this was done in 2015.   Her daughter is her HCPOA.   She notes that her anxiety is not worse since she got this dx She worried about other diseases that she might get along with her CLL- she is seeing dermatology  She did have her mammo last week- it was overdue. She is waiting on her report Pap is UTD- negative smear with negative HPV in 2016 Colonoscopy last year- ok  She does not currently see pulmonology- she did see one in [place] who did a CT about 3 years ago, saw the lung nodule that we saw again on her current CT She does notice that her breathing is not as good as it was in the past- she may get SOB when she is walking or talking a lot She is using some herbal and natural remedies as well to help boost her immune function  Patient Active Problem List   Diagnosis Date Noted  . Chronic lymphocytic leukemia ([place]) 07/31/2016  . Mass in neck 04/17/2016  . Abdominal mass 04/17/2016  . Skin lump of leg 12/12/2015  . Skin lump of arm 12/12/2015  . Asthma 12/12/2015  . Multiple  environmental allergies 12/12/2015  . Rash and nonspecific skin eruption 09/19/2015  . Chronic lower back pain 09/12/2015  . Bipolar 1 disorder, depressed ([place]) 08/27/2015    Class: Chronic  . Other specified hypothyroidism 08/23/2015  . Borderline personality disorder 08/15/2015  . Severe benzodiazepine use disorder ([place]) 08/15/2015  . Alcohol use disorder, moderate, dependence ([place]) 08/15/2015  . PTSD (post-traumatic stress disorder) 08/15/2015  . History of bipolar disorder 08/15/2015  . Bronchopneumonia 07/10/2015  . Cough 06/19/2015  . Pre-syncope 05/17/2015  . Skull deformity 05/17/2015  . Headache 05/17/2015  . Patellofemoral syndrome of both knees 05/02/2015  . Knee pain, bilateral 03/05/2015    Past Medical History:  Diagnosis Date  . Anxiety   . Arthritis   . Asthma   . Bipolar disorder ([place])   . Bulging lumbar disc 07/19/05  . Chronic headaches   . Chronic lymphocytic leukemia ([place]) 07/31/2016  . Colon polyps   . Degenerative disorder of bone   . Depression   . History of borderline personality disorder   . Hypothyroidism 2007   Developed after use of Lithium  . IBS (irritable bowel syndrome) 1995  . Pneumonia   . Proctitis   . Substance abuse    ativan last month    Past Surgical History:  Procedure Laterality Date  . ABDOMINAL HYSTERECTOMY    . APPENDECTOMY    . BUNIONECTOMY Right 06/2012  . CHOLECYSTECTOMY    . OOPHORECTOMY    . SHOULDER OPEN ROTATOR CUFF REPAIR Right 03/2010  . TUBAL LIGATION      Social History  Substance Use Topics  . Smoking status: Former Smoker    Quit date: 07/20/1980  . Smokeless tobacco: Never Used  . Alcohol use 0.0 oz/week     Comment: unsure    Family History  Problem Relation Age of Onset  . Depression Mother   . Hypertension Mother   . Post-traumatic stress disorder Sister   . Alcohol abuse Brother   . Drug abuse Brother   . Post-traumatic stress disorder Brother   . Thyroid disease Father   . Alzheimer's  disease Father   . COPD Maternal Grandmother   . Heart disease Maternal Grandfather   . Arthritis Paternal Grandmother   . Diabetes Paternal Grandmother   . Depression Paternal Grandmother   . Alzheimer's disease Paternal Grandmother   . Heart disease Paternal Grandfather   . Coronary artery disease Paternal Grandfather   . Alcohol abuse Paternal Grandfather   . Colon cancer Neg Hx     Allergies  Allergen Reactions  . Abilify [Aripiprazole] Swelling    Throat begins to swell   . Ciprofloxacin Shortness Of Breath  . Lamictal [Lamotrigine] Rash  . Amoxicillin Hives  . Doxycycline Hives  . Latex Rash  . Meloxicam Other (See Comments)    Induces mania  . Mirapex [Pramipexole] Hives  . Prednisone Other (See Comments)    Interacts with Lithium. " All steroids"  . Risperdal [Risperidone] Hives  . Tape Rash    "steri strips"  . Cortisone     Exacerbates the mania of her bipolar  . Naproxen     Interacts with lithium.   Marland Kitchen Percocet [Oxycodone-Acetaminophen] Other (See Comments)    Severe dizziness  . Other Rash    Throat swelling and rash to WALNUTS and PINE NUTS  . Peanuts [Peanut Oil] Rash    Throat swelling  . Sulfa Antibiotics Rash    Medication list has been reviewed and updated.  Current Outpatient Prescriptions on File Prior to Visit  Medication Sig Dispense Refill  . albuterol (PROVENTIL HFA;VENTOLIN HFA) 108 (90 Base) MCG/ACT inhaler Inhale 1-2 puffs into the lungs every 6 (six) hours as needed for wheezing or shortness of breath. 1 each 3  . fluticasone furoate-vilanterol (BREO ELLIPTA) 100-25 MCG/INH AEPB Inhale 1 puff into the lungs daily.    . hyoscyamine (LEVSIN, ANASPAZ) 0.125 MG tablet Take 1 tablet (0.125 mg total) by mouth as needed for cramping. Reported on 09/24/2015 10 tablet 2  . LORazepam (ATIVAN) 0.5 MG tablet Take 1 tablet (0.5 mg total) by mouth daily as needed for anxiety. 30 tablet 0   No current facility-administered medications on file prior to  visit.     Review of Systems:  As per HPI- otherwise negative. She has not noted any wheezing She does not have to use the albuterol- also has not been using her Breo at all recently    Physical Examination: Vitals:   09/07/16 1016  BP: 110/73  Pulse: 70  Temp: 98.6 F (37 C)   Vitals:   09/07/16 1016  Weight: 149 lb 12.8 oz (67.9 kg)  Height: 5\' 4"  (1.626 m)   Body mass index is 25.71 kg/m. Ideal Body Weight: Weight in (lb) to have BMI = 25:  145.3  GEN: WDWN, NAD, Non-toxic, A & O x 3, looks well HEENT: Atraumatic, Normocephalic. Neck supple. No masses, No LAD. Ears and Nose: No external deformity. CV: RRR, No M/G/R. No JVD. No thrill. No extra heart sounds. PULM: CTA B, no wheezes, crackles, rhonchi. No retractions. No resp. distress. No accessory muscle use. ABD: S, NT, ND. No rebound. No HSM. EXTR: No c/c/e NEURO Normal gait.  PSYCH: Normally interactive. Conversant. Not depressed or anxious appearing.  Calm demeanor.    Assessment and Plan: Shortness of breath - Plan: Ambulatory referral to Pulmonology  CLL (chronic lymphocytic leukemia) ([place])  Mild persistent asthma without complication  Here today to discuss her CLL- at this time she does not need any treatment, and Dr. Marin Olp is watching her closely. She feels [name] she is handling this dx well and is hopefull that she will continue to have years of good health However she has started to notice more sx of SOB- this has been present over the last several months. Her recent CT did not show anything of immediate concern in her chest She would [name] to see pulmonology- will arrange this for her Continue inhaler for the time being   Signed Lamar Blinks, Morgan

## 2016-09-08 DIAGNOSIS — D225 Melanocytic nevi of trunk: Secondary | ICD-10-CM | POA: Diagnosis not present

## 2016-09-08 DIAGNOSIS — D235 Other benign neoplasm of skin of trunk: Secondary | ICD-10-CM | POA: Diagnosis not present

## 2016-09-08 DIAGNOSIS — Z8603 Personal history of neoplasm of uncertain behavior: Secondary | ICD-10-CM | POA: Diagnosis not present

## 2016-09-08 DIAGNOSIS — D485 Neoplasm of uncertain behavior of skin: Secondary | ICD-10-CM | POA: Diagnosis not present

## 2016-09-08 DIAGNOSIS — D1801 Hemangioma of skin and subcutaneous tissue: Secondary | ICD-10-CM | POA: Diagnosis not present

## 2016-09-08 LAB — PROTEIN ELECTROPHORESIS, SERUM, WITH REFLEX
A/G Ratio: 1.7 (ref 0.7–1.7)
ALBUMIN: 3.8 g/dL (ref 2.9–4.4)
ALPHA 1: 0.2 g/dL (ref 0.0–0.4)
Alpha 2: 0.7 g/dL (ref 0.4–1.0)
Beta: 0.8 g/dL (ref 0.7–1.3)
Gamma Globulin: 0.6 g/dL (ref 0.4–1.8)
Globulin, Total: 2.2 (ref 2.2–3.9)
TOTAL PROTEIN: 6 g/dL (ref 6.0–8.5)

## 2016-10-02 ENCOUNTER — Other Ambulatory Visit: Payer: Self-pay

## 2016-10-02 ENCOUNTER — Ambulatory Visit: Payer: Self-pay | Admitting: Hematology & Oncology

## 2016-10-12 ENCOUNTER — Encounter: Payer: Self-pay | Admitting: Family Medicine

## 2016-10-20 ENCOUNTER — Telehealth: Payer: Self-pay | Admitting: Family Medicine

## 2016-10-20 NOTE — Telephone Encounter (Signed)
Patient called to inform provider that a form is being faxed to the office for her to get her DL's renewed. States form will be from [place]. In reference to her mental health and driving.

## 2016-10-27 ENCOUNTER — Encounter: Payer: Self-pay | Admitting: Family Medicine

## 2016-10-29 ENCOUNTER — Encounter: Payer: Self-pay | Admitting: Pulmonary Disease

## 2016-10-29 ENCOUNTER — Ambulatory Visit (INDEPENDENT_AMBULATORY_CARE_PROVIDER_SITE_OTHER): Payer: Medicare Other | Admitting: Pulmonary Disease

## 2016-10-29 VITALS — BP 114/74 | HR 72 | Ht 64.0 in | Wt 151.0 lb

## 2016-10-29 DIAGNOSIS — J45909 Unspecified asthma, uncomplicated: Secondary | ICD-10-CM | POA: Diagnosis not present

## 2016-10-29 DIAGNOSIS — R911 Solitary pulmonary nodule: Secondary | ICD-10-CM

## 2016-10-29 MED ORDER — FLUTICASONE-SALMETEROL 100-50 MCG/DOSE IN AEPB: 1.0000 | INHALATION_SPRAY | Freq: Two times a day (BID) | RESPIRATORY_TRACT | 0 refills | Status: DC

## 2016-10-29 NOTE — Assessment & Plan Note (Addendum)
Not clear that her current symptoms are attributable to asthma. Her symptoms certainly did not seem typical. Lung function is normal without any evidence of airway obstruction. Brio caused problems with her mood and she would like to avoid steroids Trial of Advair 100/50 one puff twice daily rinse mouth after use -I was able to convince her to use a controller medication since the steroid dose is low  Use albuterol MDI 2 puffs as needed only for rescue. We will provide you with a spacer

## 2016-10-29 NOTE — Assessment & Plan Note (Signed)
Follow-up CT chest in 6 months without contrast for nodules

## 2016-10-29 NOTE — Patient Instructions (Addendum)
Lung function is normal. Trial of Advair 100/50 one puff twice daily rinse mouth after use  Use albuterol MDI 2 puffs as needed only for rescue. We will provide you with a spacer  Follow-up CT chest in 6 months without contrast for nodules

## 2016-10-29 NOTE — Progress Notes (Signed)
Subjective:    Patient ID: Morgan [name], female    DOB: 04-01-1966, 51 y.o.   MRN: [id number]  HPI  Chief Complaint  Patient presents with  . Pulm Consult    Increased SOB in the past few weeks. Unable to walk and talk at the same time. Unable to sing like she wants to due to SOB. Reminds her of when she had PNA a few years ago. States she has a cough as well. Semi-productive cough.     51 year old with bipolar disorder and asthma presents for evaluation of dyspnea and exertion. She reports gradually progressing dyspnea over the last 8 months. She reports problems singing for at least 2 years. But lately she has found that when she is talking and walking at the same time she has to pause to catch her breath. She also reports non-exertional, nonradiating substernal chest pain and occasional dysphagia to solids and liquids. She relates this experience similar to when she had an episode of pneumonia in 06/2016. She had a negative endoscopy in 09/2015 which only showed mild gastritis and no esophageal obstruction. She reports a dry cough without diurnal or seasonal variation  She has recently been diagnosed with CLL in 07/2016 and sees Dr. Marin Olp. CT of neck/chest and abdomen/pelvis in 08/2016 showed general bilateral cervical lymphadenopathy, there was also axillary and mediastinal lymphadenopathy and some abdominal lymphadenopathy. Bone marrow biopsy was not felt necessary. Small pulmonary nodules were noted largest 7 mm  She reports asthma since her 27s did not have it as a child, triggers being chemicals and odors, presents as chest tightness try cough and wheeze she feels that her current symptoms are not similar to her asthma symptoms. She was given Brio with seem to relieve her symptoms but made her moods worse. She's not on any medications currently for bipolar disease and she has controlled this with her diet and with prayer. Albuterol seemed to relieve her symptoms but she is not able to take  this without a spacer and she currently does not have a spacer.  She has lived in [place], [place] and [place] and in [place] prior to moving to [place] 2 years ago with her daughter age 81 lives  Spirometry today showed no evidence of airway obstruction FEV1 of 99%, FVC is 90%  Past Medical History:  Diagnosis Date  . Anxiety   . Arthritis   . Asthma   . Bipolar disorder ([place])   . Bulging lumbar disc 07/19/05  . Chronic headaches   . Chronic lymphocytic leukemia ([place]) 07/31/2016  . Colon polyps   . Degenerative disorder of bone   . Depression   . History of borderline personality disorder   . Hypothyroidism 2007   Developed after use of Lithium  . IBS (irritable bowel syndrome) 1995  . Pneumonia   . Proctitis   . Substance abuse    ativan last month    Past Surgical History:  Procedure Laterality Date  . ABDOMINAL HYSTERECTOMY    . APPENDECTOMY    . BUNIONECTOMY Right 06/2012  . CHOLECYSTECTOMY    . OOPHORECTOMY    . SHOULDER OPEN ROTATOR CUFF REPAIR Right 03/2010  . TUBAL LIGATION       Allergies  Allergen Reactions  . Abilify [Aripiprazole] Swelling    Throat begins to swell   . Ciprofloxacin Shortness Of Breath  . Lamictal [Lamotrigine] Rash  . Amoxicillin Hives  . Doxycycline Hives  . Latex Rash  . Meloxicam Other (See Comments)  Induces mania  . Mirapex [Pramipexole] Hives  . Prednisone Other (See Comments)    Interacts with Lithium. " All steroids"  . Risperdal [Risperidone] Hives  . Tape Rash    "steri strips"  . Cortisone     Exacerbates the mania of her bipolar  . Naproxen     Interacts with lithium.   Marland Kitchen Percocet [Oxycodone-Acetaminophen] Other (See Comments)    Severe dizziness  . Other Rash    Throat swelling and rash to WALNUTS and PINE NUTS  . Peanuts [Peanut Oil] Rash    Throat swelling  . Sulfa Antibiotics Rash     Social History   Social History  . Marital status: Divorced    Spouse name: N/A  . Number of  children: 2  . Years of education: 14   Occupational History  . Unemployed    Social History Main Topics  . Smoking status: Former Smoker    Quit date: 07/20/1980  . Smokeless tobacco: Never Used  . Alcohol use 0.0 oz/week     Comment: unsure  . Drug use: Yes    Types: Benzodiazepines  . Sexual activity: No     Comment: intercourse age unknown,sexual partners less than 5   Other Topics Concern  . Not on file   Social History Narrative   Fun: [place], travel, music, writing, walking, hiking, volunteering   Denies religious beliefs effecting health care.    Feels safe at home.      Family History  Problem Relation Age of Onset  . Depression Mother   . Hypertension Mother   . Post-traumatic stress disorder Sister   . Alcohol abuse Brother   . Drug abuse Brother   . Post-traumatic stress disorder Brother   . Thyroid disease Father   . Alzheimer's disease Father   . COPD Maternal Grandmother   . Heart disease Maternal Grandfather   . Arthritis Paternal Grandmother   . Diabetes Paternal Grandmother   . Depression Paternal Grandmother   . Alzheimer's disease Paternal Grandmother   . Heart disease Paternal Grandfather   . Coronary artery disease Paternal Grandfather   . Alcohol abuse Paternal Grandfather   . Colon cancer Neg Hx     Review of Systems   Constitutional: negative for anorexia, fevers and sweats  Eyes: negative for irritation, redness and visual disturbance  Ears, nose, mouth, throat, and face: negative for earaches, epistaxis, nasal congestion and sore throat  Respiratory: negative for cough, dyspnea on exertion, sputum and wheezing  Cardiovascular: negative for chest pain, dyspnea, lower extremity edema, orthopnea, palpitations and syncope  Gastrointestinal: negative for abdominal pain, constipation, diarrhea, melena, nausea and vomiting  Genitourinary:negative for dysuria, frequency and hematuria  Hematologic/lymphatic: negative for bleeding, easy  bruising and lymphadenopathy  Musculoskeletal:negative for arthralgias, muscle weakness and stiff joints  Neurological: negative for coordination problems, gait problems, headaches and weakness  Endocrine: negative for diabetic symptoms including polydipsia, polyuria and weight loss     Objective:   Physical Exam  Gen. Pleasant, well-nourished, in no distress, normal affect ENT - no lesions, no post nasal drip Neck: No JVD, no thyromegaly, no carotid bruits Lungs: no use of accessory muscles, no dullness to percussion, clear without rales or rhonchi  Cardiovascular: Rhythm regular, heart sounds  normal, no murmurs or gallops, no peripheral edema Abdomen: soft and non-tender, no hepatosplenomegaly, BS normal. Musculoskeletal: No deformities, no cyanosis or clubbing Neuro:  alert, non focal       Assessment & Plan:

## 2016-11-03 NOTE — Telephone Encounter (Signed)
Completed DMV forms for pt today- will leave for her to pick up

## 2016-11-11 ENCOUNTER — Encounter: Payer: Self-pay | Admitting: Family Medicine

## 2016-11-11 ENCOUNTER — Ambulatory Visit (HOSPITAL_BASED_OUTPATIENT_CLINIC_OR_DEPARTMENT_OTHER)
Admission: RE | Admit: 2016-11-11 | Discharge: 2016-11-11 | Disposition: A | Discharge status: Home / Self Care | Payer: Medicare Other | Source: Ambulatory Visit | Attending: Family Medicine | Admitting: Family Medicine

## 2016-11-11 ENCOUNTER — Ambulatory Visit (INDEPENDENT_AMBULATORY_CARE_PROVIDER_SITE_OTHER): Payer: Medicare Other | Admitting: Family Medicine

## 2016-11-11 VITALS — BP 105/75 | HR 78 | Temp 97.8°F | Resp 16 | Ht 63.5 in | Wt 151.8 lb

## 2016-11-11 DIAGNOSIS — M1711 Unilateral primary osteoarthritis, right knee: Secondary | ICD-10-CM | POA: Diagnosis not present

## 2016-11-11 DIAGNOSIS — M25462 Effusion, left knee: Secondary | ICD-10-CM

## 2016-11-11 DIAGNOSIS — M170 Bilateral primary osteoarthritis of knee: Secondary | ICD-10-CM | POA: Diagnosis not present

## 2016-11-11 DIAGNOSIS — Z131 Encounter for screening for diabetes mellitus: Secondary | ICD-10-CM | POA: Diagnosis not present

## 2016-11-11 DIAGNOSIS — M25562 Pain in left knee: Secondary | ICD-10-CM | POA: Insufficient documentation

## 2016-11-11 DIAGNOSIS — M1712 Unilateral primary osteoarthritis, left knee: Secondary | ICD-10-CM | POA: Diagnosis not present

## 2016-11-11 DIAGNOSIS — G8929 Other chronic pain: Secondary | ICD-10-CM | POA: Insufficient documentation

## 2016-11-11 DIAGNOSIS — M25561 Pain in right knee: Secondary | ICD-10-CM | POA: Insufficient documentation

## 2016-11-11 LAB — HEMOGLOBIN A1C: HEMOGLOBIN A1C: 5.8 % (ref 4.6–6.5)

## 2016-11-11 LAB — SEDIMENTATION RATE: SED RATE: 8 mm/h (ref 0–30)

## 2016-11-11 LAB — CK: CK TOTAL: 246 U/L — AB (ref 7–177)

## 2016-11-11 LAB — C-REACTIVE PROTEIN: CRP: 0.1 mg/dL — ABNORMAL LOW (ref 0.5–20.0)

## 2016-11-11 NOTE — Patient Instructions (Addendum)
Please go to the ground floor imaging dept and have films taken of your right knee We will likely have you see an orthopedist to discuss your knee pain; remember that you are not able to use injected steroids but something else (synvisc) might be helpful for you.    I would suggest that you add some fats to your meals to help keep your blood sugar even We will check your A1c today to screen for diabetes, and I will also do some basic labs to screen for any autoimmune disorder

## 2016-11-11 NOTE — Progress Notes (Signed)
[place] at [facility] [place], [place], [place] [address] [phone]  Date:  11/11/2016   Name:  Morgan [name]   DOB:  [birth year]   MRN:  [id number]  PCP:  Lamar Blinks, MD    Chief Complaint: Knee Pain   History of Present Illness:  Morgan [name] is a 51 y.o. very pleasant female patient who presents with the following:  She was dx with CLL earlier this year; for the time being this is under observation Here today to discuss a few concerns She has had some pain in her right knee which radiates into the rest of her right leg- worse at night/at rest. Better when she is active.  She has noted this on a consistent basis for the last 6 months or so. It would wax and wane prior to this.   Her left knee has also been problematic in the past but it is not currently the main offender.   Her friend who is in the PT field has looked at her knee and suggested some exercises.  She does not think she ever had an MRI of her knee, but she did have an US of the right knee in 2016 which showed degenerative change She tried using a compression sock which did help, and OTC meds as needed  She does tend to have some sciatica as well which was bothering her last week- better now  She sees Dr. Davonna Belling for her pulmonology needs   She is concerned about possible neuropathy as she notes pain radiating up and down her legs She tried gabapentin but she did not tolerate this well, is cautious about starting any meds which may alter her psychiatric state.  She has bipolar 1 and is currently under good control with stable mood despite not being on any medications really. She has not been able to establish with a psychiatrist due to few practioners available in our area.  She does have ativan to use prn  Of note she does NOT tolerate steroid joint injections due to mania. She is aware of this and will not allow a steroid joint injection   No fever or  chills She is sleeping ok, appetite is ok Thinking is fine  She is working at [employer] right now as she saves up money to buy a car.  However this job is quite physically demanding and is really more than she feels that she can manage.  She plans to stick it out as long as she is able   BP Readings from Last 3 Encounters:  11/11/16 98/68  10/29/16 114/74  09/07/16 110/73     Patient Active Problem List   Diagnosis Date Noted  . Solitary pulmonary nodule 10/29/2016  . Chronic lymphocytic leukemia ([place]) 07/31/2016  . Mass in neck 04/17/2016  . Abdominal mass 04/17/2016  . Asthma 12/12/2015  . Multiple environmental allergies 12/12/2015  . Chronic lower back pain 09/12/2015  . Bipolar 1 disorder, depressed ([place]) 08/27/2015    Class: Chronic  . Other specified hypothyroidism 08/23/2015  . Borderline personality disorder 08/15/2015  . Severe benzodiazepine use disorder ([place]) 08/15/2015  . Alcohol use disorder, moderate, dependence ([place]) 08/15/2015  . PTSD (post-traumatic stress disorder) 08/15/2015  . History of bipolar disorder 08/15/2015  . Pre-syncope 05/17/2015  . Skull deformity 05/17/2015  . Patellofemoral syndrome of both knees 05/02/2015    Past Medical History:  Diagnosis Date  . Anxiety   .  Arthritis   . Asthma   . Bipolar disorder ([place])   . Bulging lumbar disc 07/19/05  . Chronic headaches   . Chronic lymphocytic leukemia ([place]) 07/31/2016  . Colon polyps   . Degenerative disorder of bone   . Depression   . History of borderline personality disorder   . Hypothyroidism 2007   Developed after use of Lithium  . IBS (irritable bowel syndrome) 1995  . Pneumonia   . Proctitis   . Substance abuse    ativan last month    Past Surgical History:  Procedure Laterality Date  . ABDOMINAL HYSTERECTOMY    . APPENDECTOMY    . BUNIONECTOMY Right 06/2012  . CHOLECYSTECTOMY    . OOPHORECTOMY    . SHOULDER OPEN ROTATOR CUFF REPAIR Right 03/2010  . TUBAL  LIGATION      Social History  Substance Use Topics  . Smoking status: Former Smoker    Quit date: 07/20/1980  . Smokeless tobacco: Never Used  . Alcohol use 0.0 oz/week     Comment: unsure    Family History  Problem Relation Age of Onset  . Depression Mother   . Hypertension Mother   . Post-traumatic stress disorder Sister   . Alcohol abuse Brother   . Drug abuse Brother   . Post-traumatic stress disorder Brother   . Thyroid disease Father   . Alzheimer's disease Father   . COPD Maternal Grandmother   . Heart disease Maternal Grandfather   . Arthritis Paternal Grandmother   . Diabetes Paternal Grandmother   . Depression Paternal Grandmother   . Alzheimer's disease Paternal Grandmother   . Heart disease Paternal Grandfather   . Coronary artery disease Paternal Grandfather   . Alcohol abuse Paternal Grandfather   . Colon cancer Neg Hx     Allergies  Allergen Reactions  . Abilify [Aripiprazole] Swelling    Throat begins to swell   . Ciprofloxacin Shortness Of Breath  . Lamictal [Lamotrigine] Rash  . Amoxicillin Hives  . Doxycycline Hives  . Latex Rash  . Meloxicam Other (See Comments)    Induces mania  . Mirapex [Pramipexole] Hives  . Prednisone Other (See Comments)    Interacts with Lithium. " All steroids"  . Risperdal [Risperidone] Hives  . Tape Rash    "steri strips"  . Cortisone     Exacerbates the mania of her bipolar  . Naproxen     Interacts with lithium.   Marland Kitchen Percocet [Oxycodone-Acetaminophen] Other (See Comments)    Severe dizziness  . Other Rash    Throat swelling and rash to WALNUTS and PINE NUTS  . Peanuts [Peanut Oil] Rash    Throat swelling  . Sulfa Antibiotics Rash    Medication list has been reviewed and updated.  Current Outpatient Prescriptions on File Prior to Visit  Medication Sig Dispense Refill  . albuterol (PROVENTIL HFA;VENTOLIN HFA) 108 (90 Base) MCG/ACT inhaler Inhale 1-2 puffs into the lungs every 6 (six) hours as needed for  wheezing or shortness of breath. 1 each 3  . fluticasone furoate-vilanterol (BREO ELLIPTA) 100-25 MCG/INH AEPB Inhale 1 puff into the lungs daily.    . Fluticasone-Salmeterol (ADVAIR DISKUS) 100-50 MCG/DOSE AEPB Inhale 1 puff into the lungs 2 (two) times daily. 28 each 0  . hyoscyamine (LEVSIN, ANASPAZ) 0.125 MG tablet Take 1 tablet (0.125 mg total) by mouth as needed for cramping. Reported on 09/24/2015 10 tablet 2  . LORazepam (ATIVAN) 0.5 MG tablet Take 1 tablet (0.5 mg total) by mouth  daily as needed for anxiety. 30 tablet 0   No current facility-administered medications on file prior to visit.     Review of Systems:  As per HPI- otherwise negative.   Physical Examination: Vitals:   11/11/16 1224  BP: 98/68  Pulse: 78  Resp: 16  Temp: 97.8 F (36.6 C)   Vitals:   11/11/16 1224  Weight: 151 lb 12.8 oz (68.9 kg)  Height: 5' 3.5" (1.613 m)   Body mass index is 26.47 kg/m. Ideal Body Weight: Weight in (lb) to have BMI = 25: 143.1  GEN: WDWN, NAD, Non-toxic, A & O x 3, mild overweight, otherwise looks well HEENT: Atraumatic, Normocephalic. Neck supple. No masses, No LAD. Ears and Nose: No external deformity. CV: RRR, No M/G/R. No JVD. No thrill. No extra heart sounds. PULM: CTA B, no wheezes, crackles, rhonchi. No retractions. No resp. distress. No accessory muscle use. ABD: S, NT, ND EXTR: No c/c/e NEURO Normal gait.  PSYCH: Normally interactive. Conversant. Not depressed or anxious appearing.  Calm demeanor.  bilateal knees with significant crepitus. Right knee with medial joint line tenderness.  No swelling, redness or heat in either knee,  I do no appreciate any effusion.    Results for orders placed or performed in visit on 11/11/16  Hemoglobin A1c  Result Value Ref Range   Hgb A1c MFr Bld 5.8 4.6 - 6.5 %  Sedimentation rate  Result Value Ref Range   Sed Rate 8 0 - 30 mm/hr  C-reactive protein  Result Value Ref Range   CRP 0.1 (L) 0.5 - 20.0 mg/dL  CK (Creatine  Kinase)  Result Value Ref Range   Total CK 246 (H) 7 - 177 U/L   Dg Knee Complete 4 Views Left  Result Date: 11/11/2016 CLINICAL DATA:  Bilateral knee pain, right greater than left. This began last year. EXAM: LEFT KNEE - COMPLETE 4+ VIEW COMPARISON:  None. FINDINGS: No fracture.  No bone lesion. Knee joint is normally spaced and aligned. There are marginal osteophytes from all 3 compartments, most prominently from the medial compartment. There is spurring from the tibial spine. No joint effusion.  The soft tissues are unremarkable. IMPRESSION: 1. No fracture or acute finding. 2. Mild osteoarthritis. Electronically Signed   By: Lajean Manes M.D.   On: 11/11/2016 14:05   Dg Knee Complete 4 Views Right  Result Date: 11/11/2016 CLINICAL DATA:  Bilateral knee pain, right greater than left, beginning last year. EXAM: RIGHT KNEE - COMPLETE 4+ VIEW COMPARISON:  None. FINDINGS: No fracture.  No bone lesion. Knee joint is normally spaced and aligned. Marginal osteophytes project from all 3 compartments. No joint effusion. The soft tissues are unremarkable. IMPRESSION: 1. No fracture, dislocation or acute finding. 2. Mild osteoarthritis.  No joint effusion. Electronically Signed   By: Lajean Manes M.D.   On: 11/11/2016 14:04     Assessment and Plan: Chronic pain of right knee - Plan: DG Knee Complete 4 Views Right, Ambulatory referral to Orthopedic Surgery  Pain and swelling of left knee - Plan: DG Knee Complete 4 Views Left, Ambulatory referral to Orthopedic Surgery  Screening for diabetes mellitus - Plan: Hemoglobin A1c  Arthralgia of both knees - Plan: Sedimentation rate, C-reactive protein, Rheumatoid Factor, CK (Creatine Kinase), Ambulatory referral to Orthopedic Surgery  Here today to discuss pain in both legs, but more in the right knee, for several months.  She recently took a very physically demanding job which she feels is too much for her  body, but she is not in a position to quit right  now.  Will obtain labs to look for any autoimmune issue and also plain films of her knees  Received labs and x-rays. Message to pt.   Will refer to ortho Pre-diabetes is present and will need to be monitored  Signed Lamar Blinks, MD

## 2016-11-12 LAB — RHEUMATOID FACTOR

## 2016-11-16 ENCOUNTER — Ambulatory Visit (INDEPENDENT_AMBULATORY_CARE_PROVIDER_SITE_OTHER): Payer: Medicare Other | Admitting: Orthopaedic Surgery

## 2016-11-16 DIAGNOSIS — M25561 Pain in right knee: Secondary | ICD-10-CM | POA: Diagnosis not present

## 2016-11-16 DIAGNOSIS — G8929 Other chronic pain: Secondary | ICD-10-CM | POA: Diagnosis not present

## 2016-11-16 DIAGNOSIS — M25562 Pain in left knee: Secondary | ICD-10-CM

## 2016-11-16 NOTE — Progress Notes (Signed)
Office Visit Note   Patient: Morgan [name]           Date of Birth: 1966-02-22           MRN: [id number] Visit Date: 11/16/2016              Requested by: Darreld Mclean, MD [address] [place], [place] [address] PCP: Lamar Blinks, MD   Assessment & Plan: Visit Diagnoses:  1. Chronic pain of left knee   2. Chronic pain of right knee     Plan: She is a perfect patient to try hyaluronic acid to help with her mild arthritis in the pain that this is causing. I talked her about this in length and gave her handout about this. She is agreeable crusted orders to see if this will help from a pain standpoint. Also will have her work on quad strengthening exercises well.  Follow-Up Instructions: Return in about 2 weeks (around 11/30/2016).   Orders:  No orders of the defined types were placed in this encounter.  No orders of the defined types were placed in this encounter.     Procedures: No procedures performed   Clinical Data: No additional findings.   Subjective: No chief complaint on file. The patient is a very pleasant 51 year old who reports that she basically walks everywhere because she doesn't drive. She is seeing for bilateral knee pain. Right worse than left of the left knee does give out on occasion. She is a burning sensation on her right knee and is been problematic for her. She also is been recently diagnosed with CML. She's been worked up for rheumatologic standpoint and she said this was negative. There are x-rays from interview today of her knees. She's been told not to take any more steroids. Anti-inflammatories tend to bother her stomach quite a bit. She is taking Tumeric. An glucosamine has been recommended.  HPI  Review of Systems She does report fatigue but denies any fever chills, nausea or vomiting.  Objective: Vital Signs: There were no vitals taken for this visit.  Physical Exam She is alert and oriented 3 and in no acute  distress. She walks without any significant limping. Ortho Exam Examination of both knee show no significant effusion. There is patellofemoral crepitation that is quite extensive on both knees. There is global tenderness around both knees with range of motion is good and there is no ligamentous instability at all on examination of either knee. Specialty Comments:  No specialty comments available.  Imaging: No results found. X-rays independently reviewed by me on the canopy system show mild arthritis in both knees with no acute findings.  PMFS History: Patient Active Problem List   Diagnosis Date Noted  . Solitary pulmonary nodule 10/29/2016  . Chronic lymphocytic leukemia ([place]) 07/31/2016  . Mass in neck 04/17/2016  . Abdominal mass 04/17/2016  . Asthma 12/12/2015  . Multiple environmental allergies 12/12/2015  . Chronic lower back pain 09/12/2015  . Bipolar 1 disorder, depressed ([place]) 08/27/2015    Class: Chronic  . Other specified hypothyroidism 08/23/2015  . Borderline personality disorder 08/15/2015  . Severe benzodiazepine use disorder ([place]) 08/15/2015  . Alcohol use disorder, moderate, dependence ([place]) 08/15/2015  . PTSD (post-traumatic stress disorder) 08/15/2015  . History of bipolar disorder 08/15/2015  . Pre-syncope 05/17/2015  . Skull deformity 05/17/2015  . Patellofemoral syndrome of both knees 05/02/2015   Past Medical History:  Diagnosis Date  . Anxiety   . Arthritis   .  Asthma   . Bipolar disorder ([place])   . Bulging lumbar disc 07/19/05  . Chronic headaches   . Chronic lymphocytic leukemia ([place]) 07/31/2016  . Colon polyps   . Degenerative disorder of bone   . Depression   . History of borderline personality disorder   . Hypothyroidism 2007   Developed after use of Lithium  . IBS (irritable bowel syndrome) 1995  . Pneumonia   . Proctitis   . Substance abuse    ativan last month    Family History  Problem Relation Age of Onset  . Depression Mother     . Hypertension Mother   . Post-traumatic stress disorder Sister   . Alcohol abuse Brother   . Drug abuse Brother   . Post-traumatic stress disorder Brother   . Thyroid disease Father   . Alzheimer's disease Father   . COPD Maternal Grandmother   . Heart disease Maternal Grandfather   . Arthritis Paternal Grandmother   . Diabetes Paternal Grandmother   . Depression Paternal Grandmother   . Alzheimer's disease Paternal Grandmother   . Heart disease Paternal Grandfather   . Coronary artery disease Paternal Grandfather   . Alcohol abuse Paternal Grandfather   . Colon cancer Neg Hx     Past Surgical History:  Procedure Laterality Date  . ABDOMINAL HYSTERECTOMY    . APPENDECTOMY    . BUNIONECTOMY Right 06/2012  . CHOLECYSTECTOMY    . OOPHORECTOMY    . SHOULDER OPEN ROTATOR CUFF REPAIR Right 03/2010  . TUBAL LIGATION     Social History   Occupational History  . Unemployed    Social History Main Topics  . Smoking status: Former Smoker    Quit date: 07/20/1980  . Smokeless tobacco: Never Used  . Alcohol use 0.0 oz/week     Comment: unsure  . Drug use: Yes    Types: Benzodiazepines  . Sexual activity: No     Comment: intercourse age unknown,sexual partners less than 5

## 2016-11-30 ENCOUNTER — Ambulatory Visit (INDEPENDENT_AMBULATORY_CARE_PROVIDER_SITE_OTHER): Payer: Medicare Other | Admitting: Orthopaedic Surgery

## 2016-12-01 ENCOUNTER — Telehealth (INDEPENDENT_AMBULATORY_CARE_PROVIDER_SITE_OTHER): Payer: Self-pay

## 2016-12-01 ENCOUNTER — Telehealth (INDEPENDENT_AMBULATORY_CARE_PROVIDER_SITE_OTHER): Payer: Self-pay | Admitting: Orthopaedic Surgery

## 2016-12-01 NOTE — Telephone Encounter (Signed)
Called patient left message on voicemail to return call to schedule appointment   For monovisc [phone]

## 2016-12-01 NOTE — Telephone Encounter (Signed)
Can you make her an appt with Artis Delay or Ninfa Linden next available for Monovisc injection please. Thanks!

## 2016-12-02 ENCOUNTER — Encounter: Payer: Self-pay | Admitting: Gynecology

## 2016-12-10 ENCOUNTER — Ambulatory Visit (HOSPITAL_BASED_OUTPATIENT_CLINIC_OR_DEPARTMENT_OTHER)
Admission: RE | Admit: 2016-12-10 | Discharge: 2016-12-10 | Disposition: A | Discharge status: Home / Self Care | Payer: Medicare Other | Source: Ambulatory Visit | Attending: Hematology & Oncology | Admitting: Hematology & Oncology

## 2016-12-10 ENCOUNTER — Ambulatory Visit (HOSPITAL_BASED_OUTPATIENT_CLINIC_OR_DEPARTMENT_OTHER): Payer: Medicare Other | Admitting: Hematology & Oncology

## 2016-12-10 ENCOUNTER — Other Ambulatory Visit (HOSPITAL_BASED_OUTPATIENT_CLINIC_OR_DEPARTMENT_OTHER): Payer: Medicare Other

## 2016-12-10 VITALS — BP 123/75 | HR 65 | Temp 97.6°F | Resp 16 | Wt 151.1 lb

## 2016-12-10 DIAGNOSIS — R252 Cramp and spasm: Secondary | ICD-10-CM

## 2016-12-10 DIAGNOSIS — M79601 Pain in right arm: Secondary | ICD-10-CM | POA: Diagnosis not present

## 2016-12-10 DIAGNOSIS — R5383 Other fatigue: Secondary | ICD-10-CM | POA: Diagnosis not present

## 2016-12-10 DIAGNOSIS — C9110 Chronic lymphocytic leukemia of B-cell type not having achieved remission: Secondary | ICD-10-CM

## 2016-12-10 DIAGNOSIS — M79604 Pain in right leg: Secondary | ICD-10-CM

## 2016-12-10 DIAGNOSIS — C9190 Lymphoid leukemia, unspecified not having achieved remission: Secondary | ICD-10-CM | POA: Diagnosis not present

## 2016-12-10 LAB — MANUAL DIFFERENTIAL (CHCC SATELLITE)
ALC: 9.1 10*3/uL — AB (ref 0.6–2.2)
ANC (CHCC HP manual diff): 4.3 10*3/uL (ref 1.5–6.7)
LYMPH: 65 % — ABNORMAL HIGH (ref 14–48)
MONO: 4 % (ref 0–13)
PLT EST ~~LOC~~: ADEQUATE
SEG: 31 % — AB (ref 40–75)

## 2016-12-10 LAB — CMP (CANCER CENTER ONLY)
ALBUMIN: 4.1 g/dL (ref 3.3–5.5)
ALT(SGPT): 19 U/L (ref 10–47)
AST: 25 U/L (ref 11–38)
Alkaline Phosphatase: 51 U/L (ref 26–84)
BILIRUBIN TOTAL: 0.7 mg/dL (ref 0.20–1.60)
BUN, Bld: 13 mg/dL (ref 7–22)
CALCIUM: 9.8 mg/dL (ref 8.0–10.3)
CO2: 28 meq/L (ref 18–33)
Chloride: 101 mEq/L (ref 98–108)
Creat: 1.2 mg/dl (ref 0.6–1.2)
Glucose, Bld: 87 mg/dL (ref 73–118)
Potassium: 3.9 mEq/L (ref 3.3–4.7)
Sodium: 135 mEq/L (ref 128–145)
Total Protein: 7 g/dL (ref 6.4–8.1)

## 2016-12-10 LAB — CBC WITH DIFFERENTIAL (CANCER CENTER ONLY)
HEMATOCRIT: 43.4 % (ref 34.8–46.6)
HEMOGLOBIN: 14.5 g/dL (ref 11.6–15.9)
MCH: 28.3 pg (ref 26.0–34.0)
MCHC: 33.4 g/dL (ref 32.0–36.0)
MCV: 85 fL (ref 81–101)
Platelets: 182 10*3/uL (ref 145–400)
RBC: 5.13 10*6/uL (ref 3.70–5.32)
RDW: 13.7 % (ref 11.1–15.7)
WBC: 14 10*3/uL — ABNORMAL HIGH (ref 3.9–10.0)

## 2016-12-10 NOTE — Progress Notes (Signed)
Hematology and Oncology Follow Up Visit  Morgan [name] [id number] [birth year] 51 y.o. 12/10/2016   Principle Diagnosis:   CLL - Rai stage I  Current Therapy:    Observation     Interim History:  Morgan [name] is back for follow-up. She has her Friday of complaints. I tried to reassure her that I just did not think that her current problems are from her having CLL. She has early stage disease.  She is claiming of fatigue. She says this is unusual for her and that is taken long for this to improve.  She thinks that she has more lymph nodes on the back of her neck. She also thinks there is a lymph node in the left inguinal region.  She has had no fever. She now is working. She works at [employer]. She enjoys this. She also complains of problems with her left ear. She again, she feels all of her current issues are from the CLL. She gets on the computer and gets into I think finds support groups. She wants to see if there is a support group in the area.  We did do a Doppler of her right leg. This was negative for any blood clot.  She's had no rashes. She's had no bruises. She says that she does bruise more easily.  She's had no obvious change in bowel or bladder habits.  She comes in with her friend. Her friend is her big support. Her friend also provides her with transportation.   Overall, I would say that her performance status is ECOG 0.  Medications:  Current Outpatient Prescriptions:  .  albuterol (PROVENTIL HFA;VENTOLIN HFA) 108 (90 Base) MCG/ACT inhaler, Inhale 1-2 puffs into the lungs every 6 (six) hours as needed for wheezing or shortness of breath., Disp: 1 each, Rfl: 3 .  fluticasone furoate-vilanterol (BREO ELLIPTA) 100-25 MCG/INH AEPB, Inhale 1 puff into the lungs daily., Disp: , Rfl:  .  Fluticasone-Salmeterol (ADVAIR DISKUS) 100-50 MCG/DOSE AEPB, Inhale 1 puff into the lungs 2 (two) times daily., Disp: 28 each, Rfl: 0 .  hyoscyamine (LEVSIN, ANASPAZ) 0.125 MG  tablet, Take 1 tablet (0.125 mg total) by mouth as needed for cramping. Reported on 09/24/2015, Disp: 10 tablet, Rfl: 2 .  LORazepam (ATIVAN) 0.5 MG tablet, Take 1 tablet (0.5 mg total) by mouth daily as needed for anxiety., Disp: 30 tablet, Rfl: 0  Allergies:  Allergies  Allergen Reactions  . Abilify [Aripiprazole] Swelling    Throat begins to swell   . Ciprofloxacin Shortness Of Breath  . Lamictal [Lamotrigine] Rash  . Amoxicillin Hives  . Doxycycline Hives  . Latex Rash  . Meloxicam Other (See Comments)    Induces mania  . Mirapex [Pramipexole] Hives  . Prednisone Other (See Comments)    Interacts with Lithium. " All steroids"  . Risperdal [Risperidone] Hives  . Tape Rash    "steri strips"  . Cortisone     Exacerbates the mania of her bipolar  . Naproxen     Interacts with lithium.   Marland Kitchen Percocet [Oxycodone-Acetaminophen] Other (See Comments)    Severe dizziness  . Other Rash    Throat swelling and rash to WALNUTS and PINE NUTS  . Peanuts [Peanut Oil] Rash    Throat swelling  . Sulfa Antibiotics Rash    Past Medical History, Surgical history, Social history, and Family History were reviewed and updated.  Review of Systems: As above  Physical Exam:  weight is 151 lb 1.9 oz (68.5  kg). Her oral temperature is 97.6 F (36.4 C). Her blood pressure is 123/75 and her pulse is 65. Her respiration is 16 and oxygen saturation is 100%.   Wt Readings from Last 3 Encounters:  12/10/16 151 lb 1.9 oz (68.5 kg)  11/11/16 151 lb 12.8 oz (68.9 kg)  10/29/16 151 lb (68.5 kg)     Well-developed and well-nourished white female in no obvious distress. Head and neck exam shows no ocular or oral lesions. She has some mild lymphadenopathy in the posterior cervical region. Lymph nodes are less than 1 cm and mobile. I really do not appreciate much in the way of suboccipital lymphadenopathy on the right side. Her lungs are clear to percussion and auscultation bilaterally. Cardiac exam regular  rate and rhythm with no murmurs, rubs or bruits. Abdomen is soft. She has good bowel sounds. There is no fluid wave. There is no palpable liver or spleen tip. Inguinal exam does not show any obvious inguinal adenopathy. Axillary exam shows small-less than 1 cm-lymph nodes in the left axilla. There might be a possible  lymph node in the right axilla. Back exam shows no tenderness over the spine, ribs or hips. Extremities shows no clubbing, cyanosis or edema. Skin exam shows no rashes, ecchymoses or petechia. Neurological exam shows no focal neurological deficits.  Lab Results  Component Value Date   WBC 14.0 (H) 12/10/2016   HGB 14.5 12/10/2016   HCT 43.4 12/10/2016   MCV 85 12/10/2016   PLT 182 12/10/2016     Chemistry      Component Value Date/Time   NA 135 12/10/2016 1510   NA 142 09/04/2016 0754   K 3.9 12/10/2016 1510   K 4.0 09/04/2016 0754   CL 101 12/10/2016 1510   CO2 28 12/10/2016 1510   CO2 25 09/04/2016 0754   BUN 13 12/10/2016 1510   BUN 13.4 09/04/2016 0754   CREATININE 1.2 12/10/2016 1510   CREATININE 0.8 09/04/2016 0754      Component Value Date/Time   CALCIUM 9.8 12/10/2016 1510   CALCIUM 9.7 09/04/2016 0754   ALKPHOS 51 12/10/2016 1510   ALKPHOS 54 09/04/2016 0754   AST 25 12/10/2016 1510   AST 20 09/04/2016 0754   ALT 19 12/10/2016 1510   ALT 14 09/04/2016 0754   BILITOT 0.70 12/10/2016 1510   BILITOT 0.39 09/04/2016 0754         Impression and Plan: Morgan [name] is a 51 year old white female. She has early stage CLL. She does not need to have any therapy right now.  Her CT scan really is not that remarkable. She does have a 1.8 cm left external iliac lymph node. I really cannot palpate this. There is no splenomegaly.  I just and not convinced that she needs any intervention right now. Before I would entertain any intervention, I would clearly have a bone marrow biopsy done.   I'm glad that there is no blood clot. I have just got her of CLL causing leg  cramps.   Hopefully, we can find a support group for her. I just don't believe that there is any indication that she requires therapy right now.  I would like to get her back in 3 more months. We see her back, I will make sure I get cytogenetics on her peripheral blood.   I spent about 45 minutes with her today. Mostly it was just trying to understand exactly what kind of symptoms she was having and trying to convince her that  her CLL is not the cause of these symptoms.   Volanda Napoleon, MD 5/24/20185:47 PM

## 2016-12-11 LAB — IGG, IGA, IGM
IGA/IMMUNOGLOBULIN A, SERUM: 26 mg/dL — AB (ref 87–352)
IGM (IMMUNOGLOBIN M), SRM: 24 mg/dL — AB (ref 26–217)
IgG, Qn, Serum: 678 mg/dL — ABNORMAL LOW (ref 700–1600)

## 2016-12-11 LAB — LACTATE DEHYDROGENASE: LDH: 209 U/L (ref 125–245)

## 2016-12-11 NOTE — Telephone Encounter (Signed)
Called patient again concerning appointment. Patient advised she is not going to get the injection. The number to contact patient is [phone]

## 2016-12-14 LAB — PROTEIN ELECTROPHORESIS, SERUM, WITH REFLEX
A/G Ratio: 1.8 — ABNORMAL HIGH (ref 0.7–1.7)
ALPHA 1: 0.2 g/dL (ref 0.0–0.4)
ALPHA 2: 0.7 g/dL (ref 0.4–1.0)
Albumin: 4.2 g/dL (ref 2.9–4.4)
BETA: 0.9 g/dL (ref 0.7–1.3)
GAMMA GLOBULIN: 0.6 g/dL (ref 0.4–1.8)
Globulin, Total: 2.4 g/dL (ref 2.2–3.9)
Total Protein: 6.6 g/dL (ref 6.0–8.5)

## 2016-12-22 ENCOUNTER — Encounter: Payer: Self-pay | Admitting: Family Medicine

## 2016-12-23 NOTE — Progress Notes (Signed)
[place] at [facility] [address] [place], [place] [address] [phone]  Date:  12/24/2016   Name:  Morgan [name]   DOB:  1966/04/29   MRN:  [id number]  PCP:  Darreld Mclean, MD    Chief Complaint: Follow-up (Pt here for f/u visit. )   History of Present Illness:  Morgan [name] is a 51 y.o. very pleasant female patient who presents with the following:  History of CLL discovered this year.  She has had some trouble with knee pain- has seen ortho about this issue She sent me a message on Tuesday stating that she had woken up being unable to breathe, and also with concern about a lump in her arm.  This lump has been there for a long time but seems to be getting better, and a new adjacent lump has formed.  They are not painful I asked her to come in and see me after reviewing her email message about waking up SOB as bove  Today is Thursday- early Tuesday morning she had a strange episode where she felt like she could not breathe.  It woke her from sleep, but then did not go away once she was awake.  Sx lasted maybe a couple of minutes.   She did feel a need to cough, but did not bring anything up No fever She has felt a little ill off an on- yesterday she felt tired but today is feelign ok She had a similar episode last week, but it went away more quickly.    She has been a bit worn out from working so hard to save money.  She was able to buy a car- this was a major goal for her! She saved enough money and is hoping to get a new job that will be easier on her body  She is not having any chest pressure  She has noted some sneezing and ST- she tried some homemade elderberry syrup to help her immune system  She has noted a bump in her right arm- this has been present for a long time but seems to have gotten larger.  She also has a new bump adjacent to the original one. They can be tender   Lab Results  Component Value Date   TSH 4.98 (H) 04/17/2016   BP Readings from Last 3 Encounters:  12/24/16 102/70  12/10/16 123/75  11/11/16 105/75     Patient Active Problem List   Diagnosis Date Noted  . Solitary pulmonary nodule 10/29/2016  . Chronic lymphocytic leukemia ([place]) 07/31/2016  . Mass in neck 04/17/2016  . Abdominal mass 04/17/2016  . Asthma 12/12/2015  . Multiple environmental allergies 12/12/2015  . Chronic lower back pain 09/12/2015  . Bipolar 1 disorder, depressed ([place]) 08/27/2015    Class: Chronic  . Other specified hypothyroidism 08/23/2015  . Borderline personality disorder 08/15/2015  . Severe benzodiazepine use disorder ([place]) 08/15/2015  . Alcohol use disorder, moderate, dependence ([place]) 08/15/2015  . PTSD (post-traumatic stress disorder) 08/15/2015  . History of bipolar disorder 08/15/2015  . Pre-syncope 05/17/2015  . Skull deformity 05/17/2015  . Patellofemoral syndrome of both knees 05/02/2015    Past Medical History:  Diagnosis Date  . Anxiety   . Arthritis   . Asthma   . Bipolar disorder ([place])   . Bulging lumbar disc 07/19/05  . Chronic headaches   . Chronic lymphocytic leukemia ([place]) 07/31/2016  . Colon polyps   .  Degenerative disorder of bone   . Depression   . History of borderline personality disorder   . Hypothyroidism 2007   Developed after use of Lithium  . IBS (irritable bowel syndrome) 1995  . Pneumonia   . Proctitis   . Substance abuse    ativan last month    Past Surgical History:  Procedure Laterality Date  . ABDOMINAL HYSTERECTOMY    . APPENDECTOMY    . BUNIONECTOMY Right 06/2012  . CHOLECYSTECTOMY    . OOPHORECTOMY    . SHOULDER OPEN ROTATOR CUFF REPAIR Right 03/2010  . TUBAL LIGATION      Social History  Substance Use Topics  . Smoking status: Former Smoker    Quit date: 07/20/1980  . Smokeless tobacco: Never Used  . Alcohol use 0.0 oz/week     Comment: unsure    Family History  Problem Relation Age of Onset  . Depression Mother   .  Hypertension Mother   . Post-traumatic stress disorder Sister   . Alcohol abuse Brother   . Drug abuse Brother   . Post-traumatic stress disorder Brother   . Thyroid disease Father   . Alzheimer's disease Father   . COPD Maternal Grandmother   . Heart disease Maternal Grandfather   . Arthritis Paternal Grandmother   . Diabetes Paternal Grandmother   . Depression Paternal Grandmother   . Alzheimer's disease Paternal Grandmother   . Heart disease Paternal Grandfather   . Coronary artery disease Paternal Grandfather   . Alcohol abuse Paternal Grandfather   . Colon cancer Neg Hx     Allergies  Allergen Reactions  . Abilify [Aripiprazole] Swelling    Throat begins to swell   . Ciprofloxacin Shortness Of Breath  . Lamictal [Lamotrigine] Rash  . Amoxicillin Hives  . Doxycycline Hives  . Latex Rash  . Meloxicam Other (See Comments)    Induces mania  . Mirapex [Pramipexole] Hives  . Prednisone Other (See Comments)    Interacts with Lithium. " All steroids"  . Risperdal [Risperidone] Hives  . Tape Rash    "steri strips"  . Cortisone     Exacerbates the mania of her bipolar  . Naproxen     Interacts with lithium.   Marland Kitchen Percocet [Oxycodone-Acetaminophen] Other (See Comments)    Severe dizziness  . Other Rash    Throat swelling and rash to WALNUTS and PINE NUTS  . Peanuts [Peanut Oil] Rash    Throat swelling  . Sulfa Antibiotics Rash    Medication list has been reviewed and updated.  Current Outpatient Prescriptions on File Prior to Visit  Medication Sig Dispense Refill  . albuterol (PROVENTIL HFA;VENTOLIN HFA) 108 (90 Base) MCG/ACT inhaler Inhale 1-2 puffs into the lungs every 6 (six) hours as needed for wheezing or shortness of breath. 1 each 3  . fluticasone furoate-vilanterol (BREO ELLIPTA) 100-25 MCG/INH AEPB Inhale 1 puff into the lungs daily.    . Fluticasone-Salmeterol (ADVAIR DISKUS) 100-50 MCG/DOSE AEPB Inhale 1 puff into the lungs 2 (two) times daily. 28 each 0  .  hyoscyamine (LEVSIN, ANASPAZ) 0.125 MG tablet Take 1 tablet (0.125 mg total) by mouth as needed for cramping. Reported on 09/24/2015 10 tablet 2  . LORazepam (ATIVAN) 0.5 MG tablet Take 1 tablet (0.5 mg total) by mouth daily as needed for anxiety. 30 tablet 0   No current facility-administered medications on file prior to visit.     Review of Systems:  As per HPI- otherwise negative.   Physical Examination: Vitals:  12/24/16 1413  BP: 102/70  Pulse: 84  Temp: 98.3 F (36.8 C)   Vitals:   12/24/16 1413  Weight: 151 lb 6.4 oz (68.7 kg)  Height: 5' 3.5" (1.613 m)   Body mass index is 26.4 kg/m. Ideal Body Weight: Weight in (lb) to have BMI = 25: 143.1  GEN: WDWN, NAD, Non-toxic, A & O x 3, normal weight, look well HEENT: Atraumatic, Normocephalic. Neck supple. No masses, No LAD. Bilateral TM wnl, oropharynx normal.  PEERL,EOMI.   Ears and Nose: No external deformity. CV: RRR, No M/G/R. No JVD. No thrill. No extra heart sounds. PULM: CTA B, no wheezes, crackles, rhonchi. No retractions. No resp. distress. No accessory muscle use. ABD: S, NT, ND, +BS. No rebound. No HSM. EXTR: No c/c/e NEURO Normal gait.  PSYCH: Normally interactive. Conversant. Not depressed or anxious appearing.  Calm demeanor.  Looks well Normal neuro exam including strength, sensation and DTR of all extremities She has 2 adjacent soft tissue masses in her right arm over the distal triceps.  Seem to be lipomas or similar.  Larger is approx 2cm diameter, smaller 1cm. They are round, firm and well circumscribed    Assessment and Plan: Sleep disturbance  Benign lipomatous neoplasm of skin, subcu of right arm - Plan: [place] Misc Soft Tissue  Seasonal allergic rhinitis, unspecified trigger  Recent sleep disturbance; awoke feeling SOB.  Discussed in detail with pt.  Her sx lasted a minute or two and are now resolved.  Offered further testing such as EKG and CXR- pt declines but will let me know if any other  episodes or concerns  Referral to have [place] of her arm  Signed Lamar Blinks, MD

## 2016-12-24 ENCOUNTER — Ambulatory Visit (INDEPENDENT_AMBULATORY_CARE_PROVIDER_SITE_OTHER): Payer: Medicare Other | Admitting: Family Medicine

## 2016-12-24 VITALS — BP 102/70 | HR 84 | Temp 98.3°F | Ht 63.5 in | Wt 151.4 lb

## 2016-12-24 DIAGNOSIS — D1721 Benign lipomatous neoplasm of skin and subcutaneous tissue of right arm: Secondary | ICD-10-CM

## 2016-12-24 DIAGNOSIS — G479 Sleep disorder, unspecified: Secondary | ICD-10-CM

## 2016-12-24 DIAGNOSIS — J302 Other seasonal allergic rhinitis: Secondary | ICD-10-CM

## 2016-12-24 NOTE — Patient Instructions (Signed)
It was good to see you again today!  Let me know if you have any more of the type of episode you had early Tuesday morning  I gave you some samples of allegra and also xyzal (similar to zyrtec) to try for your allergy symptoms I will set you up for an ultrasound of your right arm  Take care! congrats on your new car!!

## 2016-12-28 ENCOUNTER — Encounter: Payer: Self-pay | Admitting: Family Medicine

## 2016-12-28 DIAGNOSIS — M545 Low back pain, unspecified: Secondary | ICD-10-CM

## 2016-12-28 DIAGNOSIS — M546 Pain in thoracic spine: Secondary | ICD-10-CM

## 2016-12-30 ENCOUNTER — Telehealth: Payer: Self-pay | Admitting: Family Medicine

## 2016-12-30 NOTE — Telephone Encounter (Signed)
Caller name: Relationship to patient: [facility] Can be reached: [phone] Pharmacy:  Reason for call:  Please call [facility] to clarify order.

## 2016-12-31 ENCOUNTER — Other Ambulatory Visit: Payer: Self-pay | Admitting: Emergency Medicine

## 2016-12-31 DIAGNOSIS — D1721 Benign lipomatous neoplasm of skin and subcutaneous tissue of right arm: Secondary | ICD-10-CM

## 2016-12-31 NOTE — Telephone Encounter (Signed)
Spoke to [place] who states that [place] Misc soft tissue order is no longer used and that the order should be changed to [id number]. Changes have been made.

## 2016-12-31 NOTE — Addendum Note (Signed)
Addended by: Lamar Blinks C on: 12/31/2016 08:40 AM   Modules accepted: Orders

## 2017-01-06 ENCOUNTER — Telehealth: Payer: Self-pay | Admitting: *Deleted

## 2017-01-06 NOTE — Telephone Encounter (Signed)
Patient is calling with concerns about an enlarged lymph nose on the back of her head. She states its hard, about the size of a dime and mildly elevated. She thinks it's been there for about one month.  Reviewed with Dr Marin Olp. He would like her to monitor the node. If it continues to enlarge, or further nodes develop she can call the office back. Patient is aware of recommendations. She will follow up as needed.

## 2017-01-14 ENCOUNTER — Encounter: Payer: Self-pay | Admitting: Family Medicine

## 2017-03-10 ENCOUNTER — Encounter: Payer: Self-pay | Admitting: *Deleted

## 2017-03-10 ENCOUNTER — Ambulatory Visit (HOSPITAL_BASED_OUTPATIENT_CLINIC_OR_DEPARTMENT_OTHER): Payer: Medicare Other | Admitting: Hematology & Oncology

## 2017-03-10 ENCOUNTER — Other Ambulatory Visit (HOSPITAL_BASED_OUTPATIENT_CLINIC_OR_DEPARTMENT_OTHER): Payer: Medicare Other

## 2017-03-10 VITALS — BP 110/62 | HR 64 | Temp 98.4°F | Resp 17 | Wt 153.0 lb

## 2017-03-10 DIAGNOSIS — C9110 Chronic lymphocytic leukemia of B-cell type not having achieved remission: Secondary | ICD-10-CM | POA: Diagnosis not present

## 2017-03-10 DIAGNOSIS — C9190 Lymphoid leukemia, unspecified not having achieved remission: Secondary | ICD-10-CM | POA: Diagnosis not present

## 2017-03-10 DIAGNOSIS — M79604 Pain in right leg: Secondary | ICD-10-CM

## 2017-03-10 LAB — CBC WITH DIFFERENTIAL (CANCER CENTER ONLY)
HCT: 41.7 % (ref 34.8–46.6)
HEMOGLOBIN: 14 g/dL (ref 11.6–15.9)
MCH: 28.6 pg (ref 26.0–34.0)
MCHC: 33.6 g/dL (ref 32.0–36.0)
MCV: 85 fL (ref 81–101)
Platelets: 186 10*3/uL (ref 145–400)
RBC: 4.9 10*6/uL (ref 3.70–5.32)
RDW: 14 % (ref 11.1–15.7)
WBC: 14.1 10*3/uL — ABNORMAL HIGH (ref 3.9–10.0)

## 2017-03-10 LAB — CMP (CANCER CENTER ONLY)
ALBUMIN: 3.7 g/dL (ref 3.3–5.5)
ALT(SGPT): 20 U/L (ref 10–47)
AST: 29 U/L (ref 11–38)
Alkaline Phosphatase: 46 U/L (ref 26–84)
BUN, Bld: 11 mg/dL (ref 7–22)
CHLORIDE: 111 meq/L — AB (ref 98–108)
CO2: 28 mEq/L (ref 18–33)
Calcium: 9.6 mg/dL (ref 8.0–10.3)
Creat: 1.1 mg/dl (ref 0.6–1.2)
Glucose, Bld: 83 mg/dL (ref 73–118)
POTASSIUM: 4.2 meq/L (ref 3.3–4.7)
SODIUM: 142 meq/L (ref 128–145)
TOTAL PROTEIN: 6.8 g/dL (ref 6.4–8.1)
Total Bilirubin: 0.9 mg/dl (ref 0.20–1.60)

## 2017-03-10 LAB — MANUAL DIFFERENTIAL (CHCC SATELLITE)
ALC: 10.5 10*3/uL — ABNORMAL HIGH (ref 0.6–2.2)
ANC (CHCC MAN DIFF): 2.8 10*3/uL (ref 1.5–6.7)
BASO: 1 % (ref 0–2)
Eos: 2 % (ref 0–7)
LYMPH: 74 % — ABNORMAL HIGH (ref 14–48)
MONO: 3 % (ref 0–13)
PLT EST ~~LOC~~: ADEQUATE
RBC Comments: NORMAL
SEG: 20 % — AB (ref 40–75)

## 2017-03-10 LAB — LACTATE DEHYDROGENASE: LDH: 189 U/L (ref 125–245)

## 2017-03-10 NOTE — Progress Notes (Signed)
Hematology and Oncology Follow Up Visit  Morgan [name] [id number] 04-Apr-1966 51 y.o. 03/10/2017   Principle Diagnosis:   CLL - Rai stage I  Current Therapy:    Observation     Interim History:  Morgan [name] is back for follow-up. We saw her back in May. Since then, she's been working. She works at [employer]. She is quite busy at work.  She is having problems with her abdomen. She has irritable bowel syndrome. She does see a gastroenterologist for this. She has occasional episodes of diarrhea. She does take some pro-biotics. She is definitely into natural remedies.  She is walking. She is enjoying walks. She goes hiking. She does wear sunscreen.  She has occasional swelling of her lymph nodes in the neck. She calls when this happens. We tell her to wait a couple weeks and if the lymph nodes do not decrease, to see [place]. Thankfully, the lymph nodes do decrease.  She's had no fever. She's had no rashes. She's had no leg swelling. She's had no headache. There's been no visual changes.  Her hair looks great. She has a new hairstyle.  I'm very proud of her. She really has done a great job with the CLL. She is very proactive. She wants to go down to [facility] for a patient seminar for CLL.  Overall, her performance status is ECOG 0.  Medications:  Current Outpatient Prescriptions:  .  albuterol (PROVENTIL HFA;VENTOLIN HFA) 108 (90 Base) MCG/ACT inhaler, Inhale 1-2 puffs into the lungs every 6 (six) hours as needed for wheezing or shortness of breath., Disp: 1 each, Rfl: 3 .  fluticasone furoate-vilanterol (BREO ELLIPTA) 100-25 MCG/INH AEPB, Inhale 1 puff into the lungs daily., Disp: , Rfl:  .  Fluticasone-Salmeterol (ADVAIR DISKUS) 100-50 MCG/DOSE AEPB, Inhale 1 puff into the lungs 2 (two) times daily., Disp: 28 each, Rfl: 0 .  hyoscyamine (LEVSIN, ANASPAZ) 0.125 MG tablet, Take 1 tablet (0.125 mg total) by mouth as needed for cramping. Reported on 09/24/2015, Disp: 10 tablet, Rfl:  2 .  LORazepam (ATIVAN) 0.5 MG tablet, Take 1 tablet (0.5 mg total) by mouth daily as needed for anxiety., Disp: 30 tablet, Rfl: 0  Allergies:  Allergies  Allergen Reactions  . Abilify [Aripiprazole] Swelling    Throat begins to swell   . Ciprofloxacin Shortness Of Breath  . Lamictal [Lamotrigine] Rash  . Amoxicillin Hives  . Doxycycline Hives  . Latex Rash  . Meloxicam Other (See Comments)    Induces mania  . Mirapex [Pramipexole] Hives  . Prednisone Other (See Comments)    Interacts with Lithium. " All steroids"  . Risperdal [Risperidone] Hives  . Tape Rash    "steri strips"  . Cortisone     Exacerbates the mania of her bipolar  . Naproxen     Interacts with lithium.   Marland Kitchen Percocet [Oxycodone-Acetaminophen] Other (See Comments)    Severe dizziness  . Other Rash    Throat swelling and rash to WALNUTS and PINE NUTS  . Peanuts [Peanut Oil] Rash    Throat swelling  . Sulfa Antibiotics Rash    Past Medical History, Surgical history, Social history, and Family History were reviewed and updated.  Review of Systems: As above  Physical Exam:  weight is 153 lb (69.4 kg). Her oral temperature is 98.4 F (36.9 C). Her blood pressure is 110/62 and her pulse is 64. Her respiration is 17 and oxygen saturation is 99%.   Wt Readings from Last 3 Encounters:  03/10/17 153 lb (69.4 kg)  12/24/16 151 lb 6.4 oz (68.7 kg)  12/10/16 151 lb 1.9 oz (68.5 kg)     Well-developed and well-nourished white female in no obvious distress. Head and neck exam shows some small posterior lymph nodes in the left neck. These have been present before. They're mobile. They measure less than 1 cm. She also has a couple less than 1cm right posterior auricular lymph nodes. Lungs are clear bilaterally. Cardiac exam regular rate and rhythm with no murmurs, rubs or bruits. Abdomen is soft. She has good bowel sounds. There is no fluid wave. There may be some slight tenderness to palpation in the periumbilical  region. She has no palpable liver or spleen tip. She has no palpable inguinal lymph nodes. Axillary exam shows stable less than 1 cm mobile lymph nodes in the left axilla. Back exam shows no tenderness over the spine, ribs or hips. Extremities shows no clubbing, cyanosis or edema. Skin exam shows no rashes, ecchymoses or petechia.  Lab Results  Component Value Date   WBC 14.1 (H) 03/10/2017   HGB 14.0 03/10/2017   HCT 41.7 03/10/2017   MCV 85 03/10/2017   PLT 186 03/10/2017     Chemistry      Component Value Date/Time   NA 142 03/10/2017 0954   NA 142 09/04/2016 0754   K 4.2 03/10/2017 0954   K 4.0 09/04/2016 0754   CL 111 (H) 03/10/2017 0954   CO2 28 03/10/2017 0954   CO2 25 09/04/2016 0754   BUN 11 03/10/2017 0954   BUN 13.4 09/04/2016 0754   CREATININE 1.1 03/10/2017 0954   CREATININE 0.8 09/04/2016 0754      Component Value Date/Time   CALCIUM 9.6 03/10/2017 0954   CALCIUM 9.7 09/04/2016 0754   ALKPHOS 46 03/10/2017 0954   ALKPHOS 54 09/04/2016 0754   AST 29 03/10/2017 0954   AST 20 09/04/2016 0754   ALT 20 03/10/2017 0954   ALT 14 09/04/2016 0754   BILITOT 0.90 03/10/2017 0954   BILITOT 0.39 09/04/2016 0754         Impression and Plan: Morgan [name] is a 51 year old white female. She has stage A CLL. She is totally asymptomatic. There is no indication that we need to embark upon therapy. We do not have to do any scans.  I spoke to her for about 35 minutes. As always, she had quite a few questions. I answered her questions. She was satisfied with the answers.  Again, we do not have to embark upon any therapy on her. I told her that we hopefully can hold off on any treatment for several more years.   Because she is doing well, we do not have to get her back for 6 months. She is very happy about that.    Volanda Napoleon, MD 8/22/201811:15 AM

## 2017-03-11 ENCOUNTER — Encounter: Payer: Self-pay | Admitting: *Deleted

## 2017-03-11 LAB — BETA 2 MICROGLOBULIN, SERUM: BETA 2: 2.2 mg/L (ref 0.6–2.4)

## 2017-03-11 LAB — IGG, IGA, IGM
IGG (IMMUNOGLOBIN G), SERUM: 650 mg/dL — AB (ref 700–1600)
IGM (IMMUNOGLOBIN M), SRM: 20 mg/dL — AB (ref 26–217)
IgA, Qn, Serum: 25 mg/dL — ABNORMAL LOW (ref 87–352)

## 2017-03-12 LAB — PROTEIN ELECTROPHORESIS, SERUM, WITH REFLEX
A/G RATIO SPE: 1.7 (ref 0.7–1.7)
ALBUMIN: 4 g/dL (ref 2.9–4.4)
Alpha 1: 0.2 g/dL (ref 0.0–0.4)
Alpha 2: 0.7 g/dL (ref 0.4–1.0)
Beta: 0.9 g/dL (ref 0.7–1.3)
GAMMA GLOBULIN: 0.6 g/dL (ref 0.4–1.8)
Globulin, Total: 2.3 g/dL (ref 2.2–3.9)
TOTAL PROTEIN: 6.3 g/dL (ref 6.0–8.5)

## 2017-03-15 ENCOUNTER — Telehealth: Payer: Self-pay | Admitting: *Deleted

## 2017-03-15 NOTE — Telephone Encounter (Addendum)
Patient is aware of results  ----- Message from Volanda Napoleon, MD sent at 03/12/2017  4:47 PM EDT ----- Call - there is no M-spike in the blood. This is good!!  This means your immune system is working ok!!  pete

## 2017-04-06 ENCOUNTER — Encounter: Payer: Self-pay | Admitting: Family Medicine

## 2017-04-06 ENCOUNTER — Ambulatory Visit (HOSPITAL_BASED_OUTPATIENT_CLINIC_OR_DEPARTMENT_OTHER)
Admission: RE | Admit: 2017-04-06 | Discharge: 2017-04-06 | Disposition: A | Discharge status: Home / Self Care | Payer: Medicare Other | Source: Ambulatory Visit | Attending: Family Medicine | Admitting: Family Medicine

## 2017-04-06 ENCOUNTER — Ambulatory Visit (INDEPENDENT_AMBULATORY_CARE_PROVIDER_SITE_OTHER): Payer: Medicare Other | Admitting: Family Medicine

## 2017-04-06 VITALS — BP 110/71 | HR 69 | Temp 97.9°F | Ht 63.0 in | Wt 152.0 lb

## 2017-04-06 DIAGNOSIS — M533 Sacrococcygeal disorders, not elsewhere classified: Secondary | ICD-10-CM | POA: Diagnosis not present

## 2017-04-06 DIAGNOSIS — M542 Cervicalgia: Secondary | ICD-10-CM | POA: Insufficient documentation

## 2017-04-06 DIAGNOSIS — M7712 Lateral epicondylitis, left elbow: Secondary | ICD-10-CM | POA: Insufficient documentation

## 2017-04-06 DIAGNOSIS — J45909 Unspecified asthma, uncomplicated: Secondary | ICD-10-CM

## 2017-04-06 DIAGNOSIS — M4802 Spinal stenosis, cervical region: Secondary | ICD-10-CM | POA: Diagnosis not present

## 2017-04-06 MED ORDER — AZITHROMYCIN 250 MG PO TABS: ORAL_TABLET | ORAL | 0 refills | Status: DC

## 2017-04-06 NOTE — Patient Instructions (Addendum)
Encouraged increased rest and hydration, add probiotics, zinc such as Coldeze or Xicam. Treat fevers as needed. Elderberry liquid, vitamin C 500 to 1000 mg tab daily, plain Mucinex twice daily neck Pain, Adult Back pain is very common in adults.The cause of back pain is rarely dangerous and the pain often gets better over time.The cause of your back pain may not be known. Some common causes of back pain include:  Strain of the muscles or ligaments supporting the spine.  Wear and tear (degeneration) of the spinal disks.  Arthritis.  Direct injury to the back.  For many people, back pain may return. Since back pain is rarely dangerous, most people can learn to manage this condition on their own. Follow these instructions at home: Watch your back pain for any changes. The following actions may help to lessen any discomfort you are feeling:  Remain active. It is stressful on your back to sit or stand in one place for long periods of time. Do not sit, drive, or stand in one place for more than 30 minutes at a time. Take short walks on even surfaces as soon as you are able.Try to increase the length of time you walk each day.  Exercise regularly as directed by your health care provider. Exercise helps your back heal faster. It also helps avoid future injury by keeping your muscles strong and flexible.  Do not stay in bed.Resting more than 1-2 days can delay your recovery.  Pay attention to your body when you bend and lift. The most comfortable positions are those that put less stress on your recovering back. Always use proper lifting techniques, including: ? Bending your knees. ? Keeping the load close to your body. ? Avoiding twisting.  Find a comfortable position to sleep. Use a firm mattress and lie on your side with your knees slightly bent. If you lie on your back, put a pillow under your knees.  Avoid feeling anxious or stressed.Stress increases muscle tension and can worsen back  pain.It is important to recognize when you are anxious or stressed and learn ways to manage it, such as with exercise.  Take medicines only as directed by your health care provider. Over-the-counter medicines to reduce pain and inflammation are often the most helpful.Your health care provider may prescribe muscle relaxant drugs.These medicines help dull your pain so you can more quickly return to your normal activities and healthy exercise.  Apply ice to the injured area: ? Put ice in a plastic bag. ? Place a towel between your skin and the bag. ? Leave the ice on for 20 minutes, 2-3 times a day for the first 2-3 days. After that, ice and heat may be alternated to reduce pain and spasms.  Maintain a healthy weight. Excess weight puts extra stress on your back and makes it difficult to maintain good posture.  Contact a health care provider if:  You have pain that is not relieved with rest or medicine.  You have increasing pain going down into the legs or buttocks.  You have pain that does not improve in one week.  You have night pain.  You lose weight.  You have a fever or chills. Get help right away if:  You develop new bowel or bladder control problems.  You have unusual weakness or numbness in your arms or legs.  You develop nausea or vomiting.  You develop abdominal pain.  You feel faint. This information is not intended to replace advice given to you by  your health care provider. Make sure you discuss any questions you have with your health care provider. Document Released: 07/06/2005 Document Revised: 11/14/2015 Document Reviewed: 11/07/2013 Elsevier Interactive Patient Education  2017 [employer].

## 2017-04-06 NOTE — Assessment & Plan Note (Signed)
Left neck spasm, SCM tender to palp. She declines steroids and muscle relaxer. Encouraged topical treatments, xray ordered and sports med referral placed.

## 2017-04-06 NOTE — Progress Notes (Signed)
Subjective:    Patient ID: Morgan [name], female    DOB: [birth year], 51 y.o.   MRN: [id number]  Chief Complaint  Patient presents with  . Pain    Pt states she's had pain in the back of her head for 2 nights. States nothing helps, hasn't tried any medications Rx or OTC. States she "just lays in different postions". PAin is worst at night time. Currently is NOT in pain at this moment  . Arm Pain    Onset about a weeks ago. States it's a sharp pain in her left arm. Thinks it could be due to hef falling off a ladder in April     HPI  Patient is in today for multiple concerns. She notes 3 nights of worsening left neck pain, with spasm noted. Unable to get comfortable at night and still hurts during the day. No warmth or redness. Her left elbow pain has flared again in past couple of weeks. No redness or warmth. Had a fall last April and hurt that elbow but pain resolved and now has returned. She also notes she woke up feeling sob and congested. Also notes some recent flare in low back pain. No new falls or radicular pain. Denies CP/palp/fevers/GI or GU c/o. Taking meds as prescribed  Patient Care Team: Copland, Gay Filler, MD as PCP - General (Family Medicine) Binnie Rail, MD (Internal Medicine)   Past Medical History:  Diagnosis Date  . Anxiety   . Arthritis   . Asthma   . Bipolar disorder ([place])   . Bulging lumbar disc 07/19/05  . Chronic headaches   . Chronic lymphocytic leukemia ([place]) 07/31/2016  . Colon polyps   . Degenerative disorder of bone   . Depression   . History of borderline personality disorder   . Hypothyroidism 2007   Developed after use of Lithium  . IBS (irritable bowel syndrome) 1995  . Pneumonia   . Proctitis   . Substance abuse    ativan last month    Past Surgical History:  Procedure Laterality Date  . ABDOMINAL HYSTERECTOMY    . APPENDECTOMY    . BUNIONECTOMY Right 06/2012  . CHOLECYSTECTOMY    . OOPHORECTOMY    . SHOULDER OPEN ROTATOR CUFF REPAIR  Right 03/2010  . TUBAL LIGATION      Family History  Problem Relation Age of Onset  . Depression Mother   . Hypertension Mother   . Post-traumatic stress disorder Sister   . Alcohol abuse Brother   . Drug abuse Brother   . Post-traumatic stress disorder Brother   . Thyroid disease Father   . Alzheimer's disease Father   . COPD Maternal Grandmother   . Heart disease Maternal Grandfather   . Arthritis Paternal Grandmother   . Diabetes Paternal Grandmother   . Depression Paternal Grandmother   . Alzheimer's disease Paternal Grandmother   . Heart disease Paternal Grandfather   . Coronary artery disease Paternal Grandfather   . Alcohol abuse Paternal Grandfather   . Colon cancer Neg Hx     Social History   Social History  . Marital status: Divorced    Spouse name: N/A  . Number of children: 2  . Years of education: 14   Occupational History  . Unemployed    Social History Main Topics  . Smoking status: Former Smoker    Quit date: 07/20/1980  . Smokeless tobacco: Never Used  . Alcohol use 0.0 oz/week     Comment: unsure  .  Drug use: Yes    Types: Benzodiazepines  . Sexual activity: No     Comment: intercourse age unknown,sexual partners less than 5   Other Topics Concern  . Not on file   Social History Narrative   Fun: [place], travel, music, writing, walking, hiking, volunteering   Denies religious beliefs effecting health care.    Feels safe at home.     Outpatient Medications Prior to Visit  Medication Sig Dispense Refill  . albuterol (PROVENTIL HFA;VENTOLIN HFA) 108 (90 Base) MCG/ACT inhaler Inhale 1-2 puffs into the lungs every 6 (six) hours as needed for wheezing or shortness of breath. 1 each 3  . fluticasone furoate-vilanterol (BREO ELLIPTA) 100-25 MCG/INH AEPB Inhale 1 puff into the lungs daily.    . Fluticasone-Salmeterol (ADVAIR DISKUS) 100-50 MCG/DOSE AEPB Inhale 1 puff into the lungs 2 (two) times daily. 28 each 0  . hyoscyamine (LEVSIN, ANASPAZ) 0.125  MG tablet Take 1 tablet (0.125 mg total) by mouth as needed for cramping. Reported on 09/24/2015 10 tablet 2  . LORazepam (ATIVAN) 0.5 MG tablet Take 1 tablet (0.5 mg total) by mouth daily as needed for anxiety. 30 tablet 0   No facility-administered medications prior to visit.     Allergies  Allergen Reactions  . Abilify [Aripiprazole] Swelling    Throat begins to swell   . Ciprofloxacin Shortness Of Breath  . Lamictal [Lamotrigine] Rash  . Amoxicillin Hives  . Doxycycline Hives  . Latex Rash  . Meloxicam Other (See Comments)    Induces mania  . Mirapex [Pramipexole] Hives  . Prednisone Other (See Comments)    Interacts with Lithium. " All steroids"  . Risperdal [Risperidone] Hives  . Tape Rash    "steri strips"  . Cortisone     Exacerbates the mania of her bipolar  . Naproxen     Interacts with lithium.   Marland Kitchen Percocet [Oxycodone-Acetaminophen] Other (See Comments)    Severe dizziness  . Other Rash    Throat swelling and rash to WALNUTS and PINE NUTS  . Peanuts [Peanut Oil] Rash    Throat swelling  . Sulfa Antibiotics Rash    Review of Systems  Constitutional: Positive for malaise/fatigue. Negative for fever.  HENT: Positive for congestion.   Eyes: Negative for blurred vision.  Respiratory: Positive for shortness of breath.   Cardiovascular: Negative for chest pain, palpitations and leg swelling.  Gastrointestinal: Negative for abdominal pain, blood in stool and nausea.  Genitourinary: Negative for dysuria and frequency.  Musculoskeletal: Positive for back pain, myalgias and neck pain. Negative for falls.  Skin: Negative for rash.  Neurological: Negative for dizziness, loss of consciousness and headaches.  Endo/Heme/Allergies: Negative for environmental allergies.  Psychiatric/Behavioral: Negative for depression. The patient is not nervous/anxious.        Objective:    Physical Exam  Constitutional: She is oriented to person, place, and time. She appears  well-developed and well-nourished. No distress.  HENT:  Head: Normocephalic and atraumatic.  Nose: Nose normal.  Eyes: Right eye exhibits no discharge. Left eye exhibits no discharge.  Neck: Normal range of motion. Neck supple. No thyromegaly present.  Several enlarged lymph nodes throughout neck. Left SCM spasm and pain with palp  Cardiovascular: Normal rate and regular rhythm.   No murmur heard. Pulmonary/Chest: Effort normal and breath sounds normal.  Abdominal: Soft. Bowel sounds are normal. There is no tenderness.  Musculoskeletal: She exhibits no edema.  Lymphadenopathy:    She has cervical adenopathy.  Neurological: She is alert  and oriented to person, place, and time.  Skin: Skin is warm and dry.  Psychiatric: She has a normal mood and affect.  Nursing note and vitals reviewed.   BP 110/71   Pulse 69   Temp 97.9 F (36.6 C)   Ht 5' 3"  (1.6 m)   Wt 152 lb (68.9 kg)   SpO2 99%   BMI 26.93 kg/m  Wt Readings from Last 3 Encounters:  04/06/17 152 lb (68.9 kg)  03/10/17 153 lb (69.4 kg)  12/24/16 151 lb 6.4 oz (68.7 kg)   BP Readings from Last 3 Encounters:  04/06/17 110/71  03/10/17 110/62  12/24/16 102/70     Immunization History  Administered Date(s) Administered  . Influenza-Unspecified 04/15/2015  . Tdap 04/15/2015    Health Maintenance  Topic Date Due  . INFLUENZA VACCINE  05/06/2017 (Originally 02/17/2017)  . PAP SMEAR  03/07/2018  . MAMMOGRAM  09/01/2018  . TETANUS/TDAP  04/14/2025  . COLONOSCOPY  09/23/2025  . HIV Screening  Completed    Lab Results  Component Value Date   WBC 14.1 (H) 03/10/2017   HGB 14.0 03/10/2017   HCT 41.7 03/10/2017   PLT 186 03/10/2017   GLUCOSE 83 03/10/2017   ALT 20 03/10/2017   AST 29 03/10/2017   NA 142 03/10/2017   K 4.2 03/10/2017   CL 111 (H) 03/10/2017   CREATININE 1.1 03/10/2017   BUN 11 03/10/2017   CO2 28 03/10/2017   TSH 4.98 (H) 04/17/2016   HGBA1C 5.8 11/11/2016    Lab Results  Component Value  Date   TSH 4.98 (H) 04/17/2016   Lab Results  Component Value Date   WBC 14.1 (H) 03/10/2017   HGB 14.0 03/10/2017   HCT 41.7 03/10/2017   MCV 85 03/10/2017   PLT 186 03/10/2017   Lab Results  Component Value Date   NA 142 03/10/2017   K 4.2 03/10/2017   CHLORIDE 108 09/04/2016   CO2 28 03/10/2017   GLUCOSE 83 03/10/2017   BUN 11 03/10/2017   CREATININE 1.1 03/10/2017   BILITOT 0.90 03/10/2017   ALKPHOS 46 03/10/2017   AST 29 03/10/2017   ALT 20 03/10/2017   PROT 6.3 03/10/2017   ALBUMIN 3.7 03/10/2017   CALCIUM 9.6 03/10/2017   ANIONGAP 9 09/04/2016   EGFR 82 (L) 09/04/2016   GFR 69.14 06/18/2016   No results found for: CHOL No results found for: HDL No results found for: LDLCALC No results found for: TRIG No results found for: CHOLHDL Lab Results  Component Value Date   HGBA1C 5.8 11/11/2016         Assessment & Plan:   Problem List Items Addressed This Visit    Asthma    Woke up feeling congested and SOB this am. No significant response to albuterol. She is heading out of town this weekend and is worried. Is given a paper prescription for a Zpak in case symptoms worsen and encouraged to add Elderberry, vitamin C, mucinex and zinc.      Lateral epicondylitis of left elbow    Injured her elbow back in April during a fall at work. Underwent therapy and it resolved but over past couple of weeks has flared again with pain on palp and with certain movements. Encouraged ice and lidocaine bid and referred back to sports med for evaluation      Relevant Orders   Ambulatory referral to Sports Medicine   Neck pain - Primary    Left neck spasm, SCM  tender to palp. She declines steroids and muscle relaxer. Encouraged topical treatments, xray ordered and sports med referral placed.       Relevant Orders   DG Cervical Spine Complete   Ambulatory referral to Sports Medicine   Sacral back pain    Recent flare encouraged to stay active, add heat prn and topical  treatment and referred to sports med      Relevant Orders   Ambulatory referral to Sports Medicine      I am having Ms. Wray start on azithromycin. I am also having her maintain her hyoscyamine, LORazepam, fluticasone furoate-vilanterol, albuterol, and Fluticasone-Salmeterol.  Meds ordered this encounter  Medications  . azithromycin (ZITHROMAX) 250 MG tablet    Sig: 2 tabs po once then 1 tab po  Daily x 4 days    Dispense:  6 tablet    Refill:  0     Penni Homans, MD

## 2017-04-06 NOTE — Assessment & Plan Note (Signed)
Recent flare encouraged to stay active, add heat prn and topical treatment and referred to sports med

## 2017-04-06 NOTE — Assessment & Plan Note (Signed)
Woke up feeling congested and SOB this am. No significant response to albuterol. She is heading out of town this weekend and is worried. Is given a paper prescription for a Zpak in case symptoms worsen and encouraged to add Elderberry, vitamin C, mucinex and zinc.

## 2017-04-06 NOTE — Assessment & Plan Note (Signed)
Injured her elbow back in April during a fall at work. Underwent therapy and it resolved but over past couple of weeks has flared again with pain on palp and with certain movements. Encouraged ice and lidocaine bid and referred back to sports med for evaluation

## 2017-04-09 NOTE — Progress Notes (Deleted)
Subjective:   Morgan [name] is a 51 y.o. female who presents for Medicare Annual (Subsequent) preventive examination.  Review of Systems:  No ROS.  Medicare Wellness Visit. Additional risk factors are reflected in the social history.    Sleep patterns:  Home Safety/Smoke Alarms: Feels safe in home. Smoke alarms in place.  Living environment; residence and Firearm Safety:  [place] Safety/Bike Helmet: Wears seat belt.   Female:   Pap- last 03/08/15      Mammo- last 09/08/16 -BI-RADS CATEGORY  1: Negative.      CCS- last 09/24/15    Objective:     Vitals: There were no vitals taken for this visit.  There is no height or weight on file to calculate BMI.   Tobacco History  Smoking Status  . Former Smoker  . Quit date: 07/20/1980  Smokeless Tobacco  . Never Used     Counseling given: Not Answered   Past Medical History:  Diagnosis Date  . Anxiety   . Arthritis   . Asthma   . Bipolar disorder ([place])   . Bulging lumbar disc 07/19/05  . Chronic headaches   . Chronic lymphocytic leukemia ([place]) 07/31/2016  . Colon polyps   . Degenerative disorder of bone   . Depression   . History of borderline personality disorder   . Hypothyroidism 2007   Developed after use of Lithium  . IBS (irritable bowel syndrome) 1995  . Pneumonia   . Proctitis   . Substance abuse    ativan last month   Past Surgical History:  Procedure Laterality Date  . ABDOMINAL HYSTERECTOMY    . APPENDECTOMY    . BUNIONECTOMY Right 06/2012  . CHOLECYSTECTOMY    . OOPHORECTOMY    . SHOULDER OPEN ROTATOR CUFF REPAIR Right 03/2010  . TUBAL LIGATION     Family History  Problem Relation Age of Onset  . Depression Mother   . Hypertension Mother   . Post-traumatic stress disorder Sister   . Alcohol abuse Brother   . Drug abuse Brother   . Post-traumatic stress disorder Brother   . Thyroid disease Father   . Alzheimer's disease Father   . COPD Maternal Grandmother   . Heart disease Maternal  Grandfather   . Arthritis Paternal Grandmother   . Diabetes Paternal Grandmother   . Depression Paternal Grandmother   . Alzheimer's disease Paternal Grandmother   . Heart disease Paternal Grandfather   . Coronary artery disease Paternal Grandfather   . Alcohol abuse Paternal Grandfather   . Colon cancer Neg Hx    History  Sexual Activity  . Sexual activity: No    Comment: intercourse age unknown,sexual partners less than 5    Outpatient Encounter Prescriptions as of 04/14/2017  Medication Sig  . albuterol (PROVENTIL HFA;VENTOLIN HFA) 108 (90 Base) MCG/ACT inhaler Inhale 1-2 puffs into the lungs every 6 (six) hours as needed for wheezing or shortness of breath.  Marland Kitchen azithromycin (ZITHROMAX) 250 MG tablet 2 tabs po once then 1 tab po  Daily x 4 days  . fluticasone furoate-vilanterol (BREO ELLIPTA) 100-25 MCG/INH AEPB Inhale 1 puff into the lungs daily.  . Fluticasone-Salmeterol (ADVAIR DISKUS) 100-50 MCG/DOSE AEPB Inhale 1 puff into the lungs 2 (two) times daily.  . hyoscyamine (LEVSIN, ANASPAZ) 0.125 MG tablet Take 1 tablet (0.125 mg total) by mouth as needed for cramping. Reported on 09/24/2015  . LORazepam (ATIVAN) 0.5 MG tablet Take 1 tablet (0.5 mg total) by mouth daily as needed for anxiety.  No facility-administered encounter medications on file as of 04/14/2017.     Activities of Daily Living In your present state of health, do you have any difficulty performing the following activities: 04/06/2017  Hearing? N  Vision? N  Difficulty concentrating or making decisions? N  Walking or climbing stairs? N  Dressing or bathing? N  Doing errands, shopping? N  Some recent data might be hidden    Patient Care Team: Copland, Gay Filler, MD as PCP - General (Family Medicine) Binnie Rail, MD (Internal Medicine)    Assessment:    Physical assessment deferred to PCP.  Exercise Activities and Dietary recommendations   Diet (meal preparation, eat out, water intake, caffeinated  beverages, dairy products, fruits and vegetables): {Desc; diets:16563} Breakfast: Lunch:  Dinner:      Goals      Patient Stated   . patient (pt-stated)          Goal is to be independent with job  To continue your health objectives; diet and exercise       Fall Risk Fall Risk  06/18/2016 12/21/2015 12/20/2015 10/22/2015  Falls in the past year? Yes No No Yes  Number falls in past yr: - - - 1  Injury with Fall? Yes - - -  Follow up - - - Education provided   Depression Screen PHQ 2/9 Scores 06/18/2016 12/21/2015 12/20/2015 10/22/2015  PHQ - 2 Score 0 0 0 0  Some encounter information is confidential and restricted. Go to Review Flowsheets activity to see all data.     Cognitive Function MMSE - Mini Mental State Exam 10/22/2015  Not completed: (No Data)        Immunization History  Administered Date(s) Administered  . Influenza-Unspecified 04/15/2015  . Tdap 04/15/2015   Screening Tests Health Maintenance  Topic Date Due  . INFLUENZA VACCINE  05/06/2017 (Originally 02/17/2017)  . PAP SMEAR  03/07/2018  . MAMMOGRAM  09/01/2018  . TETANUS/TDAP  04/14/2025  . COLONOSCOPY  09/23/2025  . HIV Screening  Completed      Plan:   ***   I have personally reviewed and noted the following in the patient's chart:   . Medical and social history . Use of alcohol, tobacco or illicit drugs  . Current medications and supplements . Functional ability and status . Nutritional status . Physical activity . Advanced directives . List of other physicians . Hospitalizations, surgeries, and ER visits in previous 12 months . Vitals . Screenings to include cognitive, depression, and falls . Referrals and appointments  In addition, I have reviewed and discussed with patient certain preventive protocols, quality metrics, and best practice recommendations. A written personalized care plan for preventive services as well as general preventive health recommendations were provided to patient.      Naaman Plummer [place], [place]  04/09/2017

## 2017-04-13 ENCOUNTER — Telehealth: Payer: Self-pay | Admitting: Family Medicine

## 2017-04-13 NOTE — Telephone Encounter (Signed)
[facility] - Client TELEPHONE ADVICE RECORD [facility] Patient Name: Morgan [name] DOB: 08-24-1965 Initial Comment Caller states, having pain in back of head, bruise. No head injury. Happened last weekend same issue. Also having it in left arm. Verified. Nurse Assessment Nurse: Morgan Daft, RN, Morgan [name] Date/Time (Eastern Time): 04/13/2017 8:47:14 AM Confirm and document reason for call. If symptomatic, describe symptoms. ---Caller states that she is having left arm pain (rates now at 4/10) then also c/o pain in back of head, a lump (about 1/2 size of golf ball) there and feels like a bad bruise (rates at 8/10). Seen for this by MD in office and did not look at the back of her head to see if any bruise. Noticed a lump behind the right ear last night. -- No head injury. No neck pain. She's tried heat and ice, and applied biofreeze to neck and arm. She's also tried an oil under her tongue. Not taking any OTC meds. Does the patient have any new or worsening symptoms? ---Yes Will a triage be completed? ---Yes Related visit to physician within the last 2 weeks? ---Yes Does the PT have any chronic conditions? (i.e. diabetes, asthma, etc.) ---Yes List chronic conditions. ---Asthma, IBS, arthritis, CLL Is the patient pregnant or possibly pregnant? (Ask all females between the ages of 46-55) ---No Is this a behavioral health or substance abuse call? ---No Guidelines Guideline Title Affirmed Question Affirmed Notes Skin Lump or Localized Swelling SEVERE pain (e.g., excruciating) Final Disposition User See Physician within 4 Hours (or PCP triage) Morgan Daft, RN, Morgan [name] Comments Plans to keep her appt with Morgan [name] for tomorrow at 12:15 pm. She does not want to see any other MD at this time. Referrals REFERRED TO PCP OFFICE Caller Disagree/Comply Disagree Caller Understands Yes PreDisposition Did not know what to do

## 2017-04-14 ENCOUNTER — Ambulatory Visit (INDEPENDENT_AMBULATORY_CARE_PROVIDER_SITE_OTHER): Payer: Medicare Other | Admitting: Family Medicine

## 2017-04-14 ENCOUNTER — Ambulatory Visit: Payer: Medicare Other | Admitting: *Deleted

## 2017-04-14 VITALS — BP 109/71 | HR 68 | Temp 98.2°F | Ht 63.0 in | Wt 152.8 lb

## 2017-04-14 DIAGNOSIS — R519 Headache, unspecified: Secondary | ICD-10-CM

## 2017-04-14 DIAGNOSIS — R591 Generalized enlarged lymph nodes: Secondary | ICD-10-CM

## 2017-04-14 DIAGNOSIS — M79602 Pain in left arm: Secondary | ICD-10-CM

## 2017-04-14 DIAGNOSIS — M791 Myalgia: Secondary | ICD-10-CM | POA: Diagnosis not present

## 2017-04-14 DIAGNOSIS — R51 Headache: Secondary | ICD-10-CM | POA: Diagnosis not present

## 2017-04-14 DIAGNOSIS — M7918 Myalgia, other site: Secondary | ICD-10-CM

## 2017-04-14 LAB — CBC
HCT: 43 % (ref 36.0–46.0)
Hemoglobin: 14 g/dL (ref 12.0–15.0)
MCHC: 32.7 g/dL (ref 30.0–36.0)
MCV: 86.3 fl (ref 78.0–100.0)
Platelets: 187 10*3/uL (ref 150.0–400.0)
RBC: 4.98 Mil/uL (ref 3.87–5.11)
RDW: 13.7 % (ref 11.5–15.5)
WBC: 10.8 10*3/uL — ABNORMAL HIGH (ref 4.0–10.5)

## 2017-04-14 LAB — SEDIMENTATION RATE: SED RATE: 1 mm/h (ref 0–30)

## 2017-04-14 NOTE — Patient Instructions (Signed)
I am sorry that you are not feeling well today.  unfortunately I do not have an exact answer for why you are having this pain.  If you can tolerate it, I do think that taking ibuprofen OTC may be helpful for you and may lessen your pain  We are going to look for any change in your blood count and will also check for any abnormal inflammation today  Take care and I will be in touch with your labs asap

## 2017-04-14 NOTE — Progress Notes (Signed)
error 

## 2017-04-14 NOTE — Progress Notes (Signed)
[facility] [address], [place], [place] [address] [phone]  Date:  04/14/2017   Name:  Morgan [name]   DOB:  1966-02-14   MRN:  [id number]  PCP:  Darreld Mclean, MD    Chief Complaint: Pain (Head, Arm,Buttocks)   History of Present Illness:  Morgan [name] is a 51 y.o. very pleasant female patient who presents with the following:  I last saw her in June, as follows:  History of CLL discovered this year.  She has had some trouble with knee pain- has seen ortho about this issue She sent me a message on Tuesday stating that she had woken up being unable to breathe, and also with concern about a lump in her arm.  This lump has been there for a long time but seems to be getting better, and a new adjacent lump has formed.  They are not painful I asked her to come in and see me after reviewing her email message about waking up SOB as bove  Today is Thursday- early Tuesday morning she had a strange episode where she felt like she could not breathe.  It woke her from sleep, but then did not go away once she was awake.  Sx lasted maybe a couple of minutes.   She did feel a need to cough, but did not bring anything up No fever She has felt a little ill off an on- yesterday she felt tired but today is feelign ok She had a similar episode last week, but it went away more quickly.    She has been a bit worn out from working so hard to save money.  She was able to buy a car- this was a major goal for her! She saved enough money and is hoping to get a new job that will be easier on her body  Here today with concern of pain in her head, arms and buttocks She was here on 9/18 with neck pain and saw Dr. Charlett Blake, did films of her neck  Dg Cervical Spine Complete  Result Date: 04/06/2017 CLINICAL DATA:  Acute neck pain without known injury. EXAM: CERVICAL SPINE - COMPLETE 4+ VIEW COMPARISON:  None. FINDINGS: No fracture or  spondylolisthesis is noted. No prevertebral soft tissue swelling is noted. Moderate degenerative disc disease is noted at C5-6 and C6-7 with anterior osteophyte formation. Mild bilateral neural foraminal stenosis is noted at C5-6 and C6-7 secondary to uncovertebral spurring. IMPRESSION: Moderate degenerative disc disease is noted at C5-6 and C6-7 with mild bilateral neural foraminal stenosis at these levels secondary to uncovertebral spurring. No acute abnormality is seen in the cervical spine. Electronically Signed   By: Marijo Conception, M.D.   On: 04/06/2017 14:32   She feels like her head and scalp are hot and red to the touch No vision change, no slurred speech She also notes that her left arm is "really really bad, I'm not sure if it's from when I fell on it in April." she is using a CBD based topical treatment for this It feels good to her to elevate the left am such as placing it on a pillow  She also notes that her behind has been hurting for a long time, more so when she drives or sits for a long time.  She does not feel that this is sciatica or her piriformis as she has had those issue in the past and this seems  different.   She has noted this pain for "I don't know, years." but it is becoming more frequently bothersome  Walking does not seem to help relieve her buttock pain as it did in the past The pain does not radiate down her legs in particular, she feels that is is located very deep in her right buttock "by the bone"  She is seeing sports med next week for her arm and her buttock pain She has not been able to use NDAIDSs in the past as she was on lithium- however she is not taking this now and could just NSAIDs if she likes  Pt is somewhat upset today, disappointed that I cannot "just tell me that the pain (in her scalp) is due to a bug bite" (there is no evidence of skin inflammation), and quite resistant to the idea of taking even ibuprofen for pain relief, then tearfully stating that  "the pain just gets to you."  Seems upset that I am not able to tell her exactly what is causing her pain and then make it go away without using any medication  She is not manic or psychotic, but is not using any mood stabilizer medication at this point and does not seem as calm as her baseline  Patient Active Problem List   Diagnosis Date Noted  . Lateral epicondylitis of left elbow 04/06/2017  . Neck pain 04/06/2017  . Sacral back pain 04/06/2017  . Solitary pulmonary nodule 10/29/2016  . Chronic lymphocytic leukemia ([place]) 07/31/2016  . Mass in neck 04/17/2016  . Abdominal mass 04/17/2016  . Asthma 12/12/2015  . Multiple environmental allergies 12/12/2015  . Chronic lower back pain 09/12/2015  . Bipolar 1 disorder, depressed ([place]) 08/27/2015    Class: Chronic  . Other specified hypothyroidism 08/23/2015  . Borderline personality disorder 08/15/2015  . Severe benzodiazepine use disorder ([place]) 08/15/2015  . Alcohol use disorder, moderate, dependence ([place]) 08/15/2015  . PTSD (post-traumatic stress disorder) 08/15/2015  . History of bipolar disorder 08/15/2015  . Pre-syncope 05/17/2015  . Skull deformity 05/17/2015  . Patellofemoral syndrome of both knees 05/02/2015    Past Medical History:  Diagnosis Date  . Anxiety   . Arthritis   . Asthma   . Bipolar disorder ([place])   . Bulging lumbar disc 07/19/05  . Chronic headaches   . Chronic lymphocytic leukemia ([place]) 07/31/2016  . Colon polyps   . Degenerative disorder of bone   . Depression   . History of borderline personality disorder   . Hypothyroidism 2007   Developed after use of Lithium  . IBS (irritable bowel syndrome) 1995  . Pneumonia   . Proctitis   . Substance abuse    ativan last month    Past Surgical History:  Procedure Laterality Date  . ABDOMINAL HYSTERECTOMY    . APPENDECTOMY    . BUNIONECTOMY Right 06/2012  . CHOLECYSTECTOMY    . OOPHORECTOMY    . SHOULDER OPEN ROTATOR CUFF REPAIR Right 03/2010  .  TUBAL LIGATION      Social History  Substance Use Topics  . Smoking status: Former Smoker    Quit date: 07/20/1980  . Smokeless tobacco: Never Used  . Alcohol use 0.0 oz/week     Comment: unsure    Family History  Problem Relation Age of Onset  . Depression Mother   . Hypertension Mother   . Post-traumatic stress disorder Sister   . Alcohol abuse Brother   . Drug abuse Brother   . Post-traumatic stress  disorder Brother   . Thyroid disease Father   . Alzheimer's disease Father   . COPD Maternal Grandmother   . Heart disease Maternal Grandfather   . Arthritis Paternal Grandmother   . Diabetes Paternal Grandmother   . Depression Paternal Grandmother   . Alzheimer's disease Paternal Grandmother   . Heart disease Paternal Grandfather   . Coronary artery disease Paternal Grandfather   . Alcohol abuse Paternal Grandfather   . Colon cancer Neg Hx     Allergies  Allergen Reactions  . Abilify [Aripiprazole] Swelling    Throat begins to swell   . Ciprofloxacin Shortness Of Breath  . Lamictal [Lamotrigine] Rash  . Amoxicillin Hives  . Doxycycline Hives  . Latex Rash  . Meloxicam Other (See Comments)    Induces mania  . Mirapex [Pramipexole] Hives  . Prednisone Other (See Comments)    Interacts with Lithium. " All steroids"  . Risperdal [Risperidone] Hives  . Tape Rash    "steri strips"  . Cortisone     Exacerbates the mania of her bipolar  . Naproxen     Interacts with lithium.   Marland Kitchen Percocet [Oxycodone-Acetaminophen] Other (See Comments)    Severe dizziness  . Other Rash    Throat swelling and rash to WALNUTS and PINE NUTS  . Peanuts [Peanut Oil] Rash    Throat swelling  . Sulfa Antibiotics Rash    Medication list has been reviewed and updated.  Current Outpatient Prescriptions on File Prior to Visit  Medication Sig Dispense Refill  . albuterol (PROVENTIL HFA;VENTOLIN HFA) 108 (90 Base) MCG/ACT inhaler Inhale 1-2 puffs into the lungs every 6 (six) hours as needed  for wheezing or shortness of breath. 1 each 3  . azithromycin (ZITHROMAX) 250 MG tablet 2 tabs po once then 1 tab po  Daily x 4 days 6 tablet 0  . fluticasone furoate-vilanterol (BREO ELLIPTA) 100-25 MCG/INH AEPB Inhale 1 puff into the lungs daily.    . Fluticasone-Salmeterol (ADVAIR DISKUS) 100-50 MCG/DOSE AEPB Inhale 1 puff into the lungs 2 (two) times daily. 28 each 0  . hyoscyamine (LEVSIN, ANASPAZ) 0.125 MG tablet Take 1 tablet (0.125 mg total) by mouth as needed for cramping. Reported on 09/24/2015 10 tablet 2  . LORazepam (ATIVAN) 0.5 MG tablet Take 1 tablet (0.5 mg total) by mouth daily as needed for anxiety. 30 tablet 0   No current facility-administered medications on file prior to visit.     Review of Systems:  As per HPI- otherwise negative. No change in her hearing or vision No pain in her face or temples No neck stiffness No fever or chills No rash noted   Physical Examination: Vitals:   04/14/17 1242  BP: 109/71  Pulse: 68  Temp: 98.2 F (36.8 C)  SpO2: 98%   Vitals:   04/14/17 1242  Weight: 152 lb 12.8 oz (69.3 kg)  Height: 5\' 3"  (1.6 m)   Body mass index is 27.07 kg/m. Ideal Body Weight: Weight in (lb) to have BMI = 25: 140.8  GEN: WDWN, NAD, Non-toxic, A & O x 3, looks well, overweight, somewhat tearful Pt points out a "lump" on the left sided posterior skull.  I am not able to appreciate any mass or any significant assymetry from the other side of her scalp.  No redness or inflammation of the skin.  She does note tenderness in this area- may be from inflamed occipital nodes? HEENT: Atraumatic, Normocephalic. Neck supple. No masses, No LAD.  Bilateral TM wnl,  oropharynx normal.  PEERL,EOMI.   Ears and Nose: No external deformity. CV: RRR, No M/G/R. No JVD. No thrill. No extra heart sounds. Both arms are normal to exam and palpation, normal ROM.  I am not able to reproduce the left arm pain that she has noted.  Evidence of previous self- cutting on both arms-  old, healed PULM: CTA B, no wheezes, crackles, rhonchi. No retractions. No resp. distress. No accessory muscle use. ABD: S, NT, ND EXTR: No c/c/e NEURO Normal gait.  PSYCH: Normally interactive. Conversant. Not depressed or anxious appearing.  Calm demeanor.  She notes tenderness in the buttocks at the sciatic notch bilaterally Normal BLE strength and ROM, normal patellar DTR   Assessment and Plan: Lymphadenopathy - Plan: CBC, Sedimentation rate  Scalp pain  Left arm pain  Buttock pain  Here today to recheck pain in the back of her head, in the scalp in one particular area on the left occiput.  Explained to her that I do not see any evidence of a bug bite/ skin infection or cellulitis.  The pain does not radiate into her face at all.  At this time my best working assumption is that she is having tender occipital LAD She does have known CLL so will certainly check her CBC and sed rate She also notes pain in her left arm and buttocks, but is already scheduled to see sports med for these concerns soon Did encourage her to try an OTC pain reliever as tolerated since she seems miserable Will be in touch with her pending her lab results  Signed Lamar Blinks, MD  Results for orders placed or performed in visit on 04/14/17  CBC  Result Value Ref Range   WBC 10.8 (H) 4.0 - 10.5 K/uL   RBC 4.98 3.87 - 5.11 Mil/uL   Platelets 187.0 150.0 - 400.0 K/uL   Hemoglobin 14.0 12.0 - 15.0 g/dL   HCT 43.0 36.0 - 46.0 %   MCV 86.3 78.0 - 100.0 fl   MCHC 32.7 30.0 - 36.0 g/dL   RDW 13.7 11.5 - 15.5 %  Sedimentation rate  Result Value Ref Range   Sed Rate 1 0 - 30 mm/hr

## 2017-04-16 NOTE — Progress Notes (Addendum)
Subjective:   Morgan [name] is a 51 y.o. female who presents for Medicare Annual (Subsequent) preventive examination.  Review of Systems:  No ROS.  Medicare Wellness Visit. Additional risk factors are reflected in the social history.   Female:   Pap- last 03/08/15 normal      Mammo- last 09/08/16: normal         CCS- last 09/24/15: recall 10 yrs    Objective:     Vitals: BP 111/71 (BP Location: Right Arm, Patient Position: Sitting, Cuff Size: Normal)   Pulse 71   Ht 5\' 3"  (1.6 m)   Wt 152 lb 9.6 oz (69.2 kg)   SpO2 98%   BMI 27.03 kg/m   Body mass index is 27.03 kg/m.   Tobacco History  Smoking Status  . Former Smoker  . Quit date: 07/20/1980  Smokeless Tobacco  . Never Used     Counseling given: Not Answered   Past Medical History:  Diagnosis Date  . Anxiety   . Arthritis   . Asthma   . Bipolar disorder ([place])   . Bulging lumbar disc 07/19/05  . Chronic headaches   . Chronic lymphocytic leukemia ([place]) 07/31/2016  . Colon polyps   . Degenerative disorder of bone   . Depression   . History of borderline personality disorder   . Hypothyroidism 2007   Developed after use of Lithium  . IBS (irritable bowel syndrome) 1995  . Pneumonia   . Proctitis   . Substance abuse ([place])    ativan last month   Past Surgical History:  Procedure Laterality Date  . ABDOMINAL HYSTERECTOMY    . APPENDECTOMY    . BUNIONECTOMY Right 06/2012  . CHOLECYSTECTOMY    . OOPHORECTOMY    . SHOULDER OPEN ROTATOR CUFF REPAIR Right 03/2010  . TUBAL LIGATION     Family History  Problem Relation Age of Onset  . Depression Mother   . Hypertension Mother   . Post-traumatic stress disorder Sister   . Alcohol abuse Brother   . Drug abuse Brother   . Post-traumatic stress disorder Brother   . Thyroid disease Father   . Alzheimer's disease Father   . COPD Maternal Grandmother   . Heart disease Maternal Grandfather   . Arthritis Paternal Grandmother   . Diabetes Paternal Grandmother     . Depression Paternal Grandmother   . Alzheimer's disease Paternal Grandmother   . Heart disease Paternal Grandfather   . Coronary artery disease Paternal Grandfather   . Alcohol abuse Paternal Grandfather   . Colon cancer Neg Hx    History  Sexual Activity  . Sexual activity: No    Comment: intercourse age unknown,sexual partners less than 5    Outpatient Encounter Prescriptions as of 04/19/2017  Medication Sig  . albuterol (PROVENTIL HFA;VENTOLIN HFA) 108 (90 Base) MCG/ACT inhaler Inhale 1-2 puffs into the lungs every 6 (six) hours as needed for wheezing or shortness of breath.  . fluticasone furoate-vilanterol (BREO ELLIPTA) 100-25 MCG/INH AEPB Inhale 1 puff into the lungs daily.  . Fluticasone-Salmeterol (ADVAIR DISKUS) 100-50 MCG/DOSE AEPB Inhale 1 puff into the lungs 2 (two) times daily.  . hyoscyamine (LEVSIN, ANASPAZ) 0.125 MG tablet Take 1 tablet (0.125 mg total) by mouth as needed for cramping. Reported on 09/24/2015  . azithromycin (ZITHROMAX) 250 MG tablet 2 tabs po once then 1 tab po  Daily x 4 days (Patient not taking: Reported on 04/19/2017)  . LORazepam (ATIVAN) 0.5 MG tablet Take 1 tablet (0.5 mg  total) by mouth daily as needed for anxiety. (Patient not taking: Reported on 04/19/2017)   No facility-administered encounter medications on file as of 04/19/2017.     Activities of Daily Living In your present state of health, do you have any difficulty performing the following activities: 04/19/2017 04/06/2017  Hearing? N N  Vision? N N  Comment wearing glasses. -  Difficulty concentrating or making decisions? N N  Walking or climbing stairs? N N  Dressing or bathing? N N  Doing errands, shopping? N N  Preparing Food and eating ? N -  Using the Toilet? N -  In the past six months, have you accidently leaked urine? Y -  Comment states she wets her pants on occasion -  Do you have problems with loss of bowel control? Y -  Comment loose stools -  Managing your Medications?  N -  Managing your Finances? N -  Housekeeping or managing your Housekeeping? N -  Some recent data might be hidden    Patient Care Team: Copland, Gay Filler, MD as PCP - General (Family Medicine) Binnie Rail, MD (Internal Medicine)    Assessment:    Physical assessment deferred to PCP.  Exercise Activities and Dietary recommendations Current Exercise Habits: Home exercise routine, Type of exercise: walking, Time (Minutes): 20, Frequency (Times/Week): 3, Weekly Exercise (Minutes/Week): 60 Diet (meal preparation, eat out, water intake, caffeinated beverages, dairy products, fruits and vegetables): in general, a "healthy" diet     Goals      Patient Stated   . patient (pt-stated)          Goal is to be independent with job  To continue your health objectives; diet and exercise       Fall Risk Fall Risk  04/19/2017 06/18/2016 12/21/2015 12/20/2015 10/22/2015  Falls in the past year? Yes Yes No No Yes  Number falls in past yr: 2 or more - - - 1  Injury with Fall? Yes Yes - - -  Follow up Education provided;Falls prevention discussed - - - Education provided   Depression Screen PHQ 2/9 Scores 04/19/2017 06/18/2016 12/21/2015 12/20/2015  PHQ - 2 Score 0 0 0 0  Some encounter information is confidential and restricted. Go to Review Flowsheets activity to see all data.     Cognitive Function Ad8 score reviewed for issues:  Issues making decisions:no  Less interest in hobbies / activities:no  Repeats questions, stories (family complaining):no  Trouble using ordinary gadgets (microwave, computer, phone):no  Forgets the month or year: no  Mismanaging finances: no  Remembering appts:no  Daily problems with thinking and/or memory:no Ad8 score is=0  MMSE - Mini Mental State Exam 10/22/2015  Not completed: (No Data)        Immunization History  Administered Date(s) Administered  . Influenza-Unspecified 04/15/2015  . Tdap 04/15/2015   Screening Tests Health Maintenance   Topic Date Due  . INFLUENZA VACCINE  05/06/2017 (Originally 02/17/2017)  . PAP SMEAR  03/07/2018  . MAMMOGRAM  09/01/2018  . TETANUS/TDAP  04/14/2025  . COLONOSCOPY  09/23/2025  . HIV Screening  Completed      Plan:   Follow up with Dr.Copland as scheduled  Continue to eat heart healthy diet (full of fruits, vegetables, whole grains, lean protein, water--limit salt, fat, and sugar intake) and increase physical activity as tolerated.   I have personally reviewed and noted the following in the patient's chart:   . Medical and social history . Use of alcohol, tobacco or illicit  drugs  . Current medications and supplements . Functional ability and status . Nutritional status . Physical activity . Advanced directives . List of other physicians . Hospitalizations, surgeries, and ER visits in previous 12 months . Vitals . Screenings to include cognitive, depression, and falls . Referrals and appointments  In addition, I have reviewed and discussed with patient certain preventive protocols, quality metrics, and best practice recommendations. A written personalized care plan for preventive services as well as general preventive health recommendations were provided to patient.     Shela Nevin, RN  04/19/2017   I have reviewed the above MWE note by Ms. Vevelyn Royals and agree with her documentation  Denny Peon, MD

## 2017-04-19 ENCOUNTER — Ambulatory Visit (INDEPENDENT_AMBULATORY_CARE_PROVIDER_SITE_OTHER): Payer: Medicare Other | Admitting: *Deleted

## 2017-04-19 ENCOUNTER — Encounter: Payer: Self-pay | Admitting: *Deleted

## 2017-04-19 VITALS — BP 111/71 | HR 71 | Ht 63.0 in | Wt 152.6 lb

## 2017-04-19 DIAGNOSIS — Z0000 Encounter for general adult medical examination without abnormal findings: Secondary | ICD-10-CM | POA: Diagnosis not present

## 2017-04-19 NOTE — Patient Instructions (Signed)
[name] , Thank you for taking time to come for your Medicare Wellness Visit. I appreciate your ongoing commitment to your health goals. Please review the following plan we discussed and let me know if I can assist you in the future.   These are the goals we discussed: Goals      Patient Stated   . patient (pt-stated)          Goal is to be independent with job  To continue your health objectives; diet and exercise        This is a list of the screening recommended for you and due dates:  Health Maintenance  Topic Date Due  . Flu Shot  05/06/2017*  . Pap Smear  03/07/2018  . Mammogram  09/01/2018  . Tetanus Vaccine  04/14/2025  . Colon Cancer Screening  09/23/2025  . HIV Screening  Completed  *Topic was postponed. The date shown is not the original due date.   Continue to eat heart healthy diet (full of fruits, vegetables, whole grains, lean protein, water--limit salt, fat, and sugar intake) and increase physical activity as tolerated.  Health Maintenance, Female Adopting a healthy lifestyle and getting preventive care can go a long way to promote health and wellness. Talk with your health care provider about what schedule of regular examinations is right for you. This is a good chance for you to check in with your provider about disease prevention and staying healthy. In between checkups, there are plenty of things you can do on your own. Experts have done a lot of research about which lifestyle changes and preventive measures are most likely to keep you healthy. Ask your health care provider for more information. Weight and diet Eat a healthy diet  Be sure to include plenty of vegetables, fruits, low-fat dairy products, and lean protein.  Do not eat a lot of foods high in solid fats, added sugars, or salt.  Get regular exercise. This is one of the most important things you can do for your health. ? Most adults should exercise for at least 150 minutes each week. The  exercise should increase your heart rate and make you sweat (moderate-intensity exercise). ? Most adults should also do strengthening exercises at least twice a week. This is in addition to the moderate-intensity exercise.  Maintain a healthy weight  Body mass index (BMI) is a measurement that can be used to identify possible weight problems. It estimates body fat based on height and weight. Your health care provider can help determine your BMI and help you achieve or maintain a healthy weight.  For females 8 years of age and older: ? A BMI below 18.5 is considered underweight. ? A BMI of 18.5 to 24.9 is normal. ? A BMI of 25 to 29.9 is considered overweight. ? A BMI of 30 and above is considered obese.  Watch levels of cholesterol and blood lipids  You should start having your blood tested for lipids and cholesterol at 52 years of age, then have this test every 5 years.  You may need to have your cholesterol levels checked more often if: ? Your lipid or cholesterol levels are high. ? You are older than 52 years of age. ? You are at high risk for heart disease.  Cancer screening Lung Cancer  Lung cancer screening is recommended for adults 25-64 years old who are at high risk for lung cancer because of a history of smoking.  A yearly low-dose CT scan of the  lungs is recommended for people who: ? Currently smoke. ? Have quit within the past 15 years. ? Have at least a 30-pack-year history of smoking. A pack year is smoking an average of one pack of cigarettes a day for 1 year.  Yearly screening should continue until it has been 15 years since you quit.  Yearly screening should stop if you develop a health problem that would prevent you from having lung cancer treatment.  Breast Cancer  Practice breast self-awareness. This means understanding how your breasts normally appear and feel.  It also means doing regular breast self-exams. Let your health care provider know about any  changes, no matter how small.  If you are in your 20s or 30s, you should have a clinical breast exam (CBE) by a health care provider every 1-3 years as part of a regular health exam.  If you are 65 or older, have a CBE every year. Also consider having a breast X-ray (mammogram) every year.  If you have a family history of breast cancer, talk to your health care provider about genetic screening.  If you are at high risk for breast cancer, talk to your health care provider about having an MRI and a mammogram every year.  Breast cancer gene (BRCA) assessment is recommended for women who have family members with BRCA-related cancers. BRCA-related cancers include: ? Breast. ? Ovarian. ? Tubal. ? Peritoneal cancers.  Results of the assessment will determine the need for genetic counseling and BRCA1 and BRCA2 testing.  Cervical Cancer Your health care provider may recommend that you be screened regularly for cancer of the pelvic organs (ovaries, uterus, and vagina). This screening involves a pelvic examination, including checking for microscopic changes to the surface of your cervix (Pap test). You may be encouraged to have this screening done every 3 years, beginning at age 86.  For women ages 58-65, health care providers may recommend pelvic exams and Pap testing every 3 years, or they may recommend the Pap and pelvic exam, combined with testing for human papilloma virus (HPV), every 5 years. Some types of HPV increase your risk of cervical cancer. Testing for HPV may also be done on women of any age with unclear Pap test results.  Other health care providers may not recommend any screening for nonpregnant women who are considered low risk for pelvic cancer and who do not have symptoms. Ask your health care provider if a screening pelvic exam is right for you.  If you have had past treatment for cervical cancer or a condition that could lead to cancer, you need Pap tests and screening for cancer  for at least 20 years after your treatment. If Pap tests have been discontinued, your risk factors (such as having a new sexual partner) need to be reassessed to determine if screening should resume. Some women have medical problems that increase the chance of getting cervical cancer. In these cases, your health care provider may recommend more frequent screening and Pap tests.  Colorectal Cancer  This type of cancer can be detected and often prevented.  Routine colorectal cancer screening usually begins at 52 years of age and continues through 52 years of age.  Your health care provider may recommend screening at an earlier age if you have risk factors for colon cancer.  Your health care provider may also recommend using home test kits to check for hidden blood in the stool.  A small camera at the end of a tube can be used to  examine your colon directly (sigmoidoscopy or colonoscopy). This is done to check for the earliest forms of colorectal cancer.  Routine screening usually begins at age 49.  Direct examination of the colon should be repeated every 5-10 years through 52 years of age. However, you may need to be screened more often if early forms of precancerous polyps or small growths are found.  Skin Cancer  Check your skin from head to toe regularly.  Tell your health care provider about any new moles or changes in moles, especially if there is a change in a mole's shape or color.  Also tell your health care provider if you have a mole that is larger than the size of a pencil eraser.  Always use sunscreen. Apply sunscreen liberally and repeatedly throughout the day.  Protect yourself by wearing long sleeves, pants, a wide-brimmed hat, and sunglasses whenever you are outside.  Heart disease, diabetes, and high blood pressure  High blood pressure causes heart disease and increases the risk of stroke. High blood pressure is more likely to develop in: ? People who have blood  pressure in the high end of the normal range (130-139/85-89 mm Hg). ? People who are overweight or obese. ? People who are African American.  If you are 53-49 years of age, have your blood pressure checked every 3-5 years. If you are 53 years of age or older, have your blood pressure checked every year. You should have your blood pressure measured twice-once when you are at a hospital or clinic, and once when you are not at a hospital or clinic. Record the average of the two measurements. To check your blood pressure when you are not at a hospital or clinic, you can use: ? An automated blood pressure machine at a pharmacy. ? A home blood pressure monitor.  If you are between 39 years and 79 years old, ask your health care provider if you should take aspirin to prevent strokes.  Have regular diabetes screenings. This involves taking a blood sample to check your fasting blood sugar level. ? If you are at a normal weight and have a low risk for diabetes, have this test once every three years after 52 years of age. ? If you are overweight and have a high risk for diabetes, consider being tested at a younger age or more often. Preventing infection Hepatitis B  If you have a higher risk for hepatitis B, you should be screened for this virus. You are considered at high risk for hepatitis B if: ? You were born in a country where hepatitis B is common. Ask your health care provider which countries are considered high risk. ? Your parents were born in a high-risk country, and you have not been immunized against hepatitis B (hepatitis B vaccine). ? You have HIV or AIDS. ? You use needles to inject street drugs. ? You live with someone who has hepatitis B. ? You have had sex with someone who has hepatitis B. ? You get hemodialysis treatment. ? You take certain medicines for conditions, including cancer, organ transplantation, and autoimmune conditions.  Hepatitis C  Blood testing is recommended  for: ? Everyone born from [birth year] through 1966. ? Anyone with known risk factors for hepatitis C.  Sexually transmitted infections (STIs)  You should be screened for sexually transmitted infections (STIs) including gonorrhea and chlamydia if: ? You are sexually active and are younger than 52 years of age. ? You are older than 52 years of age and  your health care provider tells you that you are at risk for this type of infection. ? Your sexual activity has changed since you were last screened and you are at an increased risk for chlamydia or gonorrhea. Ask your health care provider if you are at risk.  If you do not have HIV, but are at risk, it may be recommended that you take a prescription medicine daily to prevent HIV infection. This is called pre-exposure prophylaxis (PrEP). You are considered at risk if: ? You are sexually active and do not regularly use condoms or know the HIV status of your partner(s). ? You take drugs by injection. ? You are sexually active with a partner who has HIV.  Talk with your health care provider about whether you are at high risk of being infected with HIV. If you choose to begin PrEP, you should first be tested for HIV. You should then be tested every 3 months for as long as you are taking PrEP. Pregnancy  If you are premenopausal and you may become pregnant, ask your health care provider about preconception counseling.  If you may become pregnant, take 400 to 800 micrograms (mcg) of folic acid every day.  If you want to prevent pregnancy, talk to your health care provider about birth control (contraception). Osteoporosis and menopause  Osteoporosis is a disease in which the bones lose minerals and strength with aging. This can result in serious bone fractures. Your risk for osteoporosis can be identified using a bone density scan.  If you are 57 years of age or older, or if you are at risk for osteoporosis and fractures, ask your health care provider if  you should be screened.  Ask your health care provider whether you should take a calcium or vitamin D supplement to lower your risk for osteoporosis.  Menopause may have certain physical symptoms and risks.  Hormone replacement therapy may reduce some of these symptoms and risks. Talk to your health care provider about whether hormone replacement therapy is right for you. Follow these instructions at home:  Schedule regular health, dental, and eye exams.  Stay current with your immunizations.  Do not use any tobacco products including cigarettes, chewing tobacco, or electronic cigarettes.  If you are pregnant, do not drink alcohol.  If you are breastfeeding, limit how much and how often you drink alcohol.  Limit alcohol intake to no more than 1 drink per day for nonpregnant women. One drink equals 12 ounces of beer, 5 ounces of wine, or 1 ounces of hard liquor.  Do not use street drugs.  Do not share needles.  Ask your health care provider for help if you need support or information about quitting drugs.  Tell your health care provider if you often feel depressed.  Tell your health care provider if you have ever been abused or do not feel safe at home. This information is not intended to replace advice given to you by your health care provider. Make sure you discuss any questions you have with your health care provider. Document Released: 01/19/2011 Document Revised: 12/12/2015 Document Reviewed: 04/09/2015 [employer] Interactive Patient Education  [employer].

## 2017-04-20 NOTE — Progress Notes (Addendum)
[facility] [place], [place], [place] [address] [phone]  Date:  04/21/2017   Name:  Morgan [name]   DOB:  [birth year]   MRN:  [id number]  PCP:  Morgan Mclean, MD    Chief Complaint: Follow-up   History of Present Illness:  Morgan [name] is a 51 y.o. very pleasant female patient who presents with the following:  Seen by myself a week ago with the following HPI: She feels like her head and scalp are hot and red to the touch No vision change, no slurred speech She also notes that her left arm is "really really bad, I'm not sure if it's from when I fell on it in April." she is using a CBD based topical treatment for this It feels good to her to elevate the left am such as placing it on a pillow  She also notes that her behind has been hurting for a long time, more so when she drives or sits for a long time.  She does not feel that this is sciatica or her piriformis as she has had those issue in the past and this seems different.   She has noted this pain for "I don't know, years." but it is becoming more frequently bothersome  Walking does not seem to help relieve her buttock pain as it did in the past The pain does not radiate down her legs in particular, she feels that is is located very deep in her right buttock "by the bone"  She is seeing sports med next week for her arm and her buttock pain She has not been able to use NDAIDSs in the past as she was on lithium- however she is not taking this now and could just NSAIDs if she likes  Pt is somewhat upset today, disappointed that I cannot "just tell me that the pain (in her scalp) is due to a bug bite" (there is no evidence of skin inflammation), and quite resistant to the idea of taking even ibuprofen for pain relief, then tearfully stating that "the pain just gets to you."  Seems upset that I am not able to tell her exactly what is causing her pain and then  make it go away without using any medication  She is not manic or psychotic, but is not using any mood stabilizer medication at this point and does not seem as calm as her baseline  She has an appt with Morgan [name], sports med, tomorrow  She did see Morgan [name] for her MWE-- pt states that Morgan [name] made this appt for her to see me as pt had not mentioned a couple of concerns about her bowels and bladder at our last visit She has noted occasional bowel incontinence since perhaps June of this year.   She may pass just a small amount of stool and not notice it until she goes to the bathroom and sees stool on her underwear.   This may occur once a week or so She feels like she may not be having a full BM when she does go and this might be why she has had these accidents  She is not having diarrhea or constipation No blood in her stool  She also notes that she feels like she has "to urinate all the time."  Even when she just urinated she will feel like she has to go again.  She is not really sure how long this  may have been going on, or how frequently it occurs She has not noted any pain with urination, no blood in her urine   She did have a colonoscopy last year which was ok - 09/24/15  She as noted to have pre-diabetes this spring. She notes that she gets headaches sometimes and wonders if this has to do with the glycemic index of her food.  She has several questions about what she should be eating to prevent fluxtuations in her blood sugar due to her "diabetes."  Counseled pt that she does not have diabetes, but early pre-diabetes.  For the time being she does not need to study the glycemic index of her diet, but should concentrate on eating a mostly healthy, whole food diet without excess sweets and sugars, and should maintain her weight and exercise  She also states that she has a long history of passing out without warning; this has not changed recently and no recent syncope noted   Morgan [name] continues to  seem somewhat anxious and has a lot of somatic complaints again today.  I am concerned that some of these symptoms may be psychiatric in origin.  She does have a history of serious mental illness and is currently unmedicated.  She has been resistant to the idea of seeing psychiatric care again, but I will continue to keep this idea in front of her  Lab Results  Component Value Date   HGBA1C 5.8 11/11/2016    Patient Active Problem List   Diagnosis Date Noted  . Lateral epicondylitis of left elbow 04/06/2017  . Neck pain 04/06/2017  . Sacral back pain 04/06/2017  . Solitary pulmonary nodule 10/29/2016  . Chronic lymphocytic leukemia ([place]) 07/31/2016  . Mass in neck 04/17/2016  . Abdominal mass 04/17/2016  . Asthma 12/12/2015  . Multiple environmental allergies 12/12/2015  . Chronic lower back pain 09/12/2015  . Bipolar 1 disorder, depressed ([place]) 08/27/2015    Class: Chronic  . Other specified hypothyroidism 08/23/2015  . Borderline personality disorder ([place]) 08/15/2015  . Severe benzodiazepine use disorder ([place]) 08/15/2015  . Alcohol use disorder, moderate, dependence ([place]) 08/15/2015  . PTSD (post-traumatic stress disorder) 08/15/2015  . History of bipolar disorder 08/15/2015  . Pre-syncope 05/17/2015  . Skull deformity 05/17/2015  . Patellofemoral syndrome of both knees 05/02/2015    Past Medical History:  Diagnosis Date  . Anxiety   . Arthritis   . Asthma   . Bipolar disorder ([place])   . Bulging lumbar disc 07/19/05  . Chronic headaches   . Chronic lymphocytic leukemia ([place]) 07/31/2016  . Colon polyps   . Degenerative disorder of bone   . Depression   . History of borderline personality disorder   . Hypothyroidism 2007   Developed after use of Lithium  . IBS (irritable bowel syndrome) 1995  . Pneumonia   . Proctitis   . Substance abuse ([place])    ativan last month    Past Surgical History:  Procedure Laterality Date  . ABDOMINAL HYSTERECTOMY    . APPENDECTOMY     . BUNIONECTOMY Right 06/2012  . CHOLECYSTECTOMY    . OOPHORECTOMY    . SHOULDER OPEN ROTATOR CUFF REPAIR Right 03/2010  . TUBAL LIGATION      Social History  Substance Use Topics  . Smoking status: Former Smoker    Quit date: 07/20/1980  . Smokeless tobacco: Never Used  . Alcohol use No     Comment: unsure    Family History  Problem Relation Age  of Onset  . Depression Mother   . Hypertension Mother   . Post-traumatic stress disorder Sister   . Alcohol abuse Brother   . Drug abuse Brother   . Post-traumatic stress disorder Brother   . Thyroid disease Father   . Alzheimer's disease Father   . COPD Maternal Grandmother   . Heart disease Maternal Grandfather   . Arthritis Paternal Grandmother   . Diabetes Paternal Grandmother   . Depression Paternal Grandmother   . Alzheimer's disease Paternal Grandmother   . Heart disease Paternal Grandfather   . Coronary artery disease Paternal Grandfather   . Alcohol abuse Paternal Grandfather   . Colon cancer Neg Hx     Allergies  Allergen Reactions  . Abilify [Aripiprazole] Swelling    Throat begins to swell   . Ciprofloxacin Shortness Of Breath  . Lamictal [Lamotrigine] Rash  . Amoxicillin Hives  . Doxycycline Hives  . Latex Rash  . Meloxicam Other (See Comments)    Induces mania  . Mirapex [Pramipexole] Hives  . Prednisone Other (See Comments)    Interacts with Lithium. " All steroids"  . Risperdal [Risperidone] Hives  . Tape Rash    "steri strips"  . Cortisone     Exacerbates the mania of her bipolar  . Naproxen     Interacts with lithium.   Marland Kitchen Percocet [Oxycodone-Acetaminophen] Other (See Comments)    Severe dizziness  . Other Rash    Throat swelling and rash to WALNUTS and PINE NUTS  . Peanuts [Peanut Oil] Rash    Throat swelling  . Sulfa Antibiotics Rash    Medication list has been reviewed and updated.  Current Outpatient Prescriptions on File Prior to Visit  Medication Sig Dispense Refill  . albuterol  (PROVENTIL HFA;VENTOLIN HFA) 108 (90 Base) MCG/ACT inhaler Inhale 1-2 puffs into the lungs every 6 (six) hours as needed for wheezing or shortness of breath. 1 each 3  . azithromycin (ZITHROMAX) 250 MG tablet 2 tabs po once then 1 tab po  Daily x 4 days 6 tablet 0  . fluticasone furoate-vilanterol (BREO ELLIPTA) 100-25 MCG/INH AEPB Inhale 1 puff into the lungs daily.    . Fluticasone-Salmeterol (ADVAIR DISKUS) 100-50 MCG/DOSE AEPB Inhale 1 puff into the lungs 2 (two) times daily. 28 each 0  . hyoscyamine (LEVSIN, ANASPAZ) 0.125 MG tablet Take 1 tablet (0.125 mg total) by mouth as needed for cramping. Reported on 09/24/2015 10 tablet 2  . LORazepam (ATIVAN) 0.5 MG tablet Take 1 tablet (0.5 mg total) by mouth daily as needed for anxiety. 30 tablet 0   No current facility-administered medications on file prior to visit.     Review of Systems:  As per HPI- otherwise negative. No fever or chills No rash  BP Readings from Last 3 Encounters:  04/21/17 113/65  04/19/17 111/71  04/14/17 109/71     Physical Examination: Vitals:   04/21/17 1546  BP: 113/65  Pulse: 72  Temp: 98.1 F (36.7 C)  SpO2: 99%   Vitals:   04/21/17 1546  Weight: 153 lb 6.4 oz (69.6 kg)  Height: 5\' 3"  (1.6 m)   Body mass index is 27.17 kg/m. Ideal Body Weight: Weight in (lb) to have BMI = 25: 140.8  GEN: WDWN, NAD, Non-toxic, A & O x 3, looks well, minimal overweight HEENT: Atraumatic, Normocephalic. Neck supple. No masses, No LAD. Ears and Nose: No external deformity. CV: RRR, No M/G/R. No JVD. No thrill. No extra heart sounds. PULM: CTA B, no wheezes,  crackles, rhonchi. No retractions. No resp. distress. No accessory muscle use. ABD: S, NT, ND, +BS. No rebound. No HSM. EXTR: No c/c/e NEURO Normal gait.  PSYCH: Normally interactive. Conversant. Not depressed or anxious appearing.  Calm demeanor.  Normal rectal exam- no significant hemorrhoid, normal tone. No fistula noted    Assessment and  Plan: Urinary frequency - Plan: Urine Culture, POCT urinalysis dipstick  Fecal smearing  Pre-diabetes  Anxiety  Here today with concern about urinary frequency and occasional loss of small amount of stool in her underwear.  Urine culture is pending and I will follow-up with her Offered to have her go back for a GI consultation but she declines for now  counseled her that intensive monitoring of carbs and "glycemic index" of her foods is not necessary, and I do not think her pre-diabetes is causing wild blood sugar swings - pt wonders if this is why she sometimes feels bad, feels like she might pass out, or has headaches.  Reassured her that I do not think that this is the case.  While I agree with following a generally healthy diet she does not need to research the glycemic index of her foods  Will plan further follow- up pending labs.   Signed Lamar Blinks, MD  10/5 Received urine culture Results for orders placed or performed in visit on 04/21/17  Urine Culture  Result Value Ref Range   MICRO NUMBER: [id number]    SPECIMEN QUALITY: ADEQUATE    Sample Source NOT GIVEN    STATUS: FINAL    Result: No Growth   POCT urinalysis dipstick  Result Value Ref Range   Color, UA yellow yellow   Clarity, UA clear clear   Glucose, UA negative negative mg/dL   Bilirubin, UA negative negative   Ketones, POC UA negative negative mg/dL   Spec Grav, UA <=1.005 (A) 1.010 - 1.025   Blood, UA negative negative   pH, UA 6.0 5.0 - 8.0   Protein Ur, POC negative negative mg/dL   Urobilinogen, UA 0.2 0.2 or 1.0 E.U./dL   Nitrite, UA Negative Negative   Leukocytes, UA Negative Negative

## 2017-04-21 ENCOUNTER — Ambulatory Visit (INDEPENDENT_AMBULATORY_CARE_PROVIDER_SITE_OTHER): Payer: Medicare Other | Admitting: Family Medicine

## 2017-04-21 VITALS — BP 113/65 | HR 72 | Temp 98.1°F | Ht 63.0 in | Wt 153.4 lb

## 2017-04-21 DIAGNOSIS — R350 Frequency of micturition: Secondary | ICD-10-CM | POA: Diagnosis not present

## 2017-04-21 DIAGNOSIS — R7303 Prediabetes: Secondary | ICD-10-CM | POA: Diagnosis not present

## 2017-04-21 DIAGNOSIS — R151 Fecal smearing: Secondary | ICD-10-CM

## 2017-04-21 DIAGNOSIS — F419 Anxiety disorder, unspecified: Secondary | ICD-10-CM

## 2017-04-21 LAB — POCT URINALYSIS DIP (MANUAL ENTRY)
BILIRUBIN UA: NEGATIVE
BILIRUBIN UA: NEGATIVE mg/dL
GLUCOSE UA: NEGATIVE mg/dL
LEUKOCYTES UA: NEGATIVE
Nitrite, UA: NEGATIVE
Protein Ur, POC: NEGATIVE mg/dL
RBC UA: NEGATIVE
Urobilinogen, UA: 0.2 E.U./dL
pH, UA: 6 (ref 5.0–8.0)

## 2017-04-21 NOTE — Patient Instructions (Addendum)
It was good to see you again today- I am sorry that you are not feeling well!  Your urine dip looks ok, but I will check your culture as well and let you know what it shows.   Please let me know if you want to see GI about your bowel concerns or if it is getting worse

## 2017-04-21 NOTE — Progress Notes (Signed)
[name] [address] [place], [place] [address] Phone: [phone] Subjective:    I'm seeing this patient by the request  of:    CC:   [id number]  Morgan [name] is a 51 y.o. female coming in with complaint of neck, arm, hip pain. Patient has lukemia.   Hip: Says she can't drive long distances and walking is painful. Says the pain has been going on for years. Says her pain is near the ischial tuberosity. When she drives she says it feels like "something is pulling on her butt". States she has a leg length discrepancy.  Arm: [place] off a ladder in April. Most of her pain is in her forearm posterior. Pain, numbness and tingling goes down to her finger tips. When she uses it the pain increases. She says she has to brace her arm when she walks. Neck/head: 2 weeks ago. Localized pain in the occiput area. She says it is warm to touch at times and it swells. Wakes her up in the middle of the night. She gets pounding pain.  She has had an xray of her neck from a previous visit 2 weeks ago. Intermittent radiculopathy describes it going down to her hand.  She has tried oral medication, prescription pads, ice, massage, stretching, heat      Past Medical History:  Diagnosis Date  . Anxiety   . Arthritis   . Asthma   . Bipolar disorder ([place])   . Bulging lumbar disc 07/19/05  . Chronic headaches   . Chronic lymphocytic leukemia ([place]) 07/31/2016  . Colon polyps   . Degenerative disorder of bone   . Depression   . History of borderline personality disorder   . Hypothyroidism 2007   Developed after use of Lithium  . IBS (irritable bowel syndrome) 1995  . Pneumonia   . Proctitis   . Substance abuse ([place])    ativan last month   Past Surgical History:  Procedure Laterality Date  . ABDOMINAL HYSTERECTOMY    . APPENDECTOMY    . BUNIONECTOMY Right 06/2012  . CHOLECYSTECTOMY    . OOPHORECTOMY    . SHOULDER OPEN ROTATOR CUFF REPAIR Right 03/2010  . TUBAL  LIGATION     Social History   Social History  . Marital status: Divorced    Spouse name: Morgan [name]  . Number of children: 2  . Years of education: 14   Occupational History  . Unemployed    Social History Main Topics  . Smoking status: Former Smoker    Quit date: 07/20/1980  . Smokeless tobacco: Never Used  . Alcohol use No     Comment: unsure  . Drug use: Yes    Types: Benzodiazepines  . Sexual activity: No     Comment: intercourse age unknown,sexual partners less than 5   Other Topics Concern  . Not on file   Social History Narrative   Fun: Morgan [name], travel, music, writing, walking, hiking, volunteering   Denies religious beliefs effecting health care.    Feels safe at home.    Allergies  Allergen Reactions  . Abilify [Aripiprazole] Swelling    Throat begins to swell   . Ciprofloxacin Shortness Of Breath  . Lamictal [Lamotrigine] Rash  . Amoxicillin Hives  . Doxycycline Hives  . Latex Rash  . Meloxicam Other (See Comments)    Induces mania  . Mirapex [Pramipexole] Hives  . Prednisone Other (See Comments)    Interacts with Lithium. " All steroids"  .  Risperdal [Risperidone] Hives  . Tape Rash    "steri strips"  . Cortisone     Exacerbates the mania of her bipolar  . Naproxen     Interacts with lithium.   Marland Kitchen Percocet [Oxycodone-Acetaminophen] Other (See Comments)    Severe dizziness  . Other Rash    Throat swelling and rash to WALNUTS and PINE NUTS  . Peanuts [Peanut Oil] Rash    Throat swelling  . Sulfa Antibiotics Rash   Family History  Problem Relation Age of Onset  . Depression Mother   . Hypertension Mother   . Post-traumatic stress disorder Sister   . Alcohol abuse Brother   . Drug abuse Brother   . Post-traumatic stress disorder Brother   . Thyroid disease Father   . Alzheimer's disease Father   . COPD Maternal Grandmother   . Heart disease Maternal Grandfather   . Arthritis Paternal Grandmother   . Diabetes Paternal Grandmother   . Depression  Paternal Grandmother   . Alzheimer's disease Paternal Grandmother   . Heart disease Paternal Grandfather   . Coronary artery disease Paternal Grandfather   . Alcohol abuse Paternal Grandfather   . Colon cancer Neg Hx      Past medical history, social, surgical and family history all reviewed in electronic medical record.  No pertanent information unless stated regarding to the chief complaint.   Review of Systems:Review of systems updated and as accurate as of 04/21/17  No  visual changes, nausea, vomiting, diarrhea, constipation, dizziness, abdominal pain, skin rash, fevers, chills, night sweats, weight loss,  joint swelling,chest pain, shortness of breath, mood changes. Positive muscle aches, mood changes, body aches, headaches, swollen lymph nodes  Objective  There were no vitals taken for this visit. Systems examined below as of 04/21/17   General: No apparent distress alert and oriented x3 mood and affect normal, dressed appropriately.  HEENT: Pupils equal, extraocular movements intact  Respiratory: Patient's speak in full sentences and does not appear short of breath  Cardiovascular: No lower extremity edema, non tender, no erythema  Skin: Warm dry intact with no signs of infection or rash on extremities or on axial skeleton.  Abdomen: Soft nontender  Neuro: Cranial nerves II through XII are intact, neurovascularly intact in all extremities with 2+ DTRs and 2+ pulses.  Lymph: No lymphadenopathy of posterior or anterior cervical chain or axillae bilaterally.  Gait normal with good balance and coordination.  MSK:  Non tender with full range of motion and good stability and symmetric strength and tone of shoulders, , wrist, hip, knee and ankles bilaterally.  Neck: Inspection mild loss of lordosis. No palpable stepoffs. Diffuse tenderness noted Negative Spurling's maneuver. ROM limitation lacking the last 5 of extension in multiple areas Grip strength and sensation normal in  bilateral hands Strength good C4 to T1 distribution No sensory change to C4 to T1 Negative Hoffman sign bilaterally Reflexes normal    Elbow: Left Unremarkable to inspection. Range of motion full pronation, supination, flexion, extension. Strength is full to all of the above directions Stable to varus, valgus stress. Negative moving valgus stress test. Diffuse tenderness over the lateral epicondylar region Ulnar nerve does not sublux. Negative cubital tunnel Tinel's.  Back exam shows the patient does have diffuse tenderness of the paraspinal musculature that seems to be out of proportion for the amount of palpation. Patient does have more of a positive Faber test on the right side. Pain in the right buttocks. Negative straight leg test. Full strength of  the lower extremity otherwise.  Procedure 10315; 15 additional minutes spent for Therapeutic exercises as stated in above notes.  This included exercises focusing on stretching, strengthening, with significant focus on eccentric aspects.   Long term goals include an improvement in range of motion, strength, endurance as well as avoiding reinjury. Patient's frequency would include in 1-2 times a day, 3-5 times a week for a duration of 6-12 weeks.  Proper technique shown and discussed handout in great detail with ATC.  All questions were discussed and answered.   Lateral Epicondylitis: Elbow anatomy was reviewed, and tendinopathy was explained.  Pt. given a formal rehab program. Series of concentric and eccentric exercises should be done starting with no weight, work up to 1 lb, hammer, etc.  Use counterforce strap if working or using hands.  Formal PT would be beneficial. Emphasized stretching an cross-friction massage Emphasized proper palms up lifting biomechanics to unload ECRB  Impression and Recommendations:     This case required medical decision making of moderate complexity.      Note: This dictation was prepared with  Dragon dictation along with smaller phrase technology. Any transcriptional errors that result from this process are unintentional.

## 2017-04-22 ENCOUNTER — Encounter: Payer: Self-pay | Admitting: Family Medicine

## 2017-04-22 ENCOUNTER — Ambulatory Visit (INDEPENDENT_AMBULATORY_CARE_PROVIDER_SITE_OTHER): Payer: Medicare Other | Admitting: Family Medicine

## 2017-04-22 DIAGNOSIS — G5701 Lesion of sciatic nerve, right lower limb: Secondary | ICD-10-CM | POA: Diagnosis not present

## 2017-04-22 DIAGNOSIS — M7712 Lateral epicondylitis, left elbow: Secondary | ICD-10-CM | POA: Diagnosis not present

## 2017-04-22 LAB — URINE CULTURE
MICRO NUMBER: 81098397
RESULT: NO GROWTH
SPECIMEN QUALITY: ADEQUATE

## 2017-04-22 MED ORDER — VITAMIN D (ERGOCALCIFEROL) 1.25 MG (50000 UNIT) PO CAPS: 50000.0000 [IU] | ORAL_CAPSULE | ORAL | 0 refills | Status: DC

## 2017-04-22 NOTE — Assessment & Plan Note (Addendum)
Discussed with patient.  HEP  Discussed bracing.  Avoid extension of wrist  RTC in 4 weeks.

## 2017-04-22 NOTE — Assessment & Plan Note (Signed)
Piriformis Syndrome  Using an anatomical model, reviewed with the patient the structures involved and how they related to diagnosis. The patient indicated understanding.   The patient was given a handout from Dr. Rouzier's book "The Sports Medicine Patient Advisor" describing the anatomy and rehabilitation of the following condition: Piriformis Syndrome  Also given a handout with more extensive Piriformis stretching, hip flexor and abductor strengthening, ham stretching  Rec deep massage, explained self-massage with ball RTC in 4 weeks 

## 2017-04-22 NOTE — Patient Instructions (Signed)
Good to see you  Ice if you are hurting.  Lateral epicondylitis and piriformis.  For the arm arnica lotion 2 times a day  Wear the wrist brace day and night for 2 weeks then nightly for 2 weeks.  Exercises 3 times a week.  For the butt pain  Exercises 3 times a week.  Tennis ball in back right pocket with sitting.  Roll a towel and put it above knees in the car Try to change the seat in the car so you are coming down on the pedals Once weekly vitamin D for 12 weeks Over the counter look for Tart cherry extract any dose at night See me again in 6 weeks.

## 2017-04-23 ENCOUNTER — Encounter: Payer: Self-pay | Admitting: Family Medicine

## 2017-05-19 ENCOUNTER — Telehealth: Payer: Self-pay | Admitting: Family Medicine

## 2017-05-19 ENCOUNTER — Encounter: Payer: Self-pay | Admitting: Family Medicine
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
## 2017-05-20 MED ORDER — EPINEPHRINE 0.3 MG/0.3ML IJ SOAJ: 0.3000 mg | Freq: Once | INTRAMUSCULAR | 99 refills | Status: DC

## 2017-05-24 MED ORDER — EPINEPHRINE 0.3 MG/0.3ML IJ SOAJ: 0.3000 mg | Freq: Once | INTRAMUSCULAR | 99 refills | Status: AC

## 2017-05-24 NOTE — Addendum Note (Signed)
Addended by: Lamar Blinks C on: 05/24/2017 08:30 AM   Modules accepted: Orders

## 2017-05-27 ENCOUNTER — Encounter: Payer: Self-pay | Admitting: Family Medicine

## 2017-05-27 ENCOUNTER — Other Ambulatory Visit: Payer: Self-pay | Admitting: Emergency Medicine

## 2017-05-28 NOTE — Telephone Encounter (Signed)
Pt requested that Azithro be removed from her chart as she is no longer using it. Rx was removed during call

## 2017-06-03 ENCOUNTER — Ambulatory Visit: Payer: Self-pay | Admitting: Family Medicine

## 2017-07-04 NOTE — Progress Notes (Addendum)
[place] [facility] [address] [place], [place] [address] [phone]  Date:  07/05/2017   Name:  Morgan [name]   DOB:  12-Oct-1965   MRN:  [id number]  PCP:  Darreld Mclean, MD    Chief Complaint: No chief complaint on file.   History of Present Illness:  Morgan [name] is a 51 y.o. very pleasant female patient who presents with the following:  History of CLL, asthma, bipolar disorder Here today with concern of a breast change in her RIGHT breast   She had a normal mammo in February of this year No family history of breast cancer TMH:DQQI in October of 2018  She was doing a self- breast check and noted this mass in her lateral right breast about 2 weeks ago  She was not sure if it was just a node but it did not resolve It is slightly tender to touch  She also wonders if she is getting sick- she notes that she got her feet wet while shoveling snow last week.  She feels like she had had chills, but no fever "what can I do to keep from getting pneumonia?"  She feels like she is getting a cold or flu like sx over the last 2-3 days Patient Active Problem List   Diagnosis Date Noted  . Piriformis syndrome of right side 04/22/2017  . Lateral epicondylitis of left elbow 04/06/2017  . Neck pain 04/06/2017  . Sacral back pain 04/06/2017  . Solitary pulmonary nodule 10/29/2016  . Chronic lymphocytic leukemia ([place]) 07/31/2016  . Mass in neck 04/17/2016  . Abdominal mass 04/17/2016  . Asthma 12/12/2015  . Multiple environmental allergies 12/12/2015  . Chronic lower back pain 09/12/2015  . Bipolar 1 disorder, depressed ([place]) 08/27/2015    Class: Chronic  . Other specified hypothyroidism 08/23/2015  . Borderline personality disorder ([place]) 08/15/2015  . Severe benzodiazepine use disorder ([place]) 08/15/2015  . Alcohol use disorder, moderate, dependence ([place]) 08/15/2015  . PTSD (post-traumatic stress disorder) 08/15/2015  .  History of bipolar disorder 08/15/2015  . Pre-syncope 05/17/2015  . Skull deformity 05/17/2015  . Patellofemoral syndrome of both knees 05/02/2015    Past Medical History:  Diagnosis Date  . Anxiety   . Arthritis   . Asthma   . Bipolar disorder ([place])   . Bulging lumbar disc 07/19/05  . Chronic headaches   . Chronic lymphocytic leukemia ([place]) 07/31/2016  . Colon polyps   . Degenerative disorder of bone   . Depression   . History of borderline personality disorder   . Hypothyroidism 2007   Developed after use of Lithium  . IBS (irritable bowel syndrome) 1995  . Pneumonia   . Proctitis   . Substance abuse ([place])    ativan last month    Past Surgical History:  Procedure Laterality Date  . ABDOMINAL HYSTERECTOMY    . APPENDECTOMY    . BUNIONECTOMY Right 06/2012  . CHOLECYSTECTOMY    . OOPHORECTOMY    . SHOULDER OPEN ROTATOR CUFF REPAIR Right 03/2010  . TUBAL LIGATION      Social History   Tobacco Use  . Smoking status: Former Smoker    Last attempt to quit: 07/20/1980    Years since quitting: 36.9  . Smokeless tobacco: Never Used  Substance Use Topics  . Alcohol use: No    Alcohol/week: 0.0 oz    Comment: unsure  . Drug use: Yes    Types:  Benzodiazepines    Family History  Problem Relation Age of Onset  . Depression Mother   . Hypertension Mother   . Post-traumatic stress disorder Sister   . Alcohol abuse Brother   . Drug abuse Brother   . Post-traumatic stress disorder Brother   . Thyroid disease Father   . Alzheimer's disease Father   . COPD Maternal Grandmother   . Heart disease Maternal Grandfather   . Arthritis Paternal Grandmother   . Diabetes Paternal Grandmother   . Depression Paternal Grandmother   . Alzheimer's disease Paternal Grandmother   . Heart disease Paternal Grandfather   . Coronary artery disease Paternal Grandfather   . Alcohol abuse Paternal Grandfather   . Colon cancer Neg Hx     Allergies  Allergen Reactions  . Abilify  [Aripiprazole] Swelling    Throat begins to swell   . Ciprofloxacin Shortness Of Breath  . Lamictal [Lamotrigine] Rash  . Amoxicillin Hives  . Doxycycline Hives  . Latex Rash  . Meloxicam Other (See Comments)    Induces mania  . Mirapex [Pramipexole] Hives  . Prednisone Other (See Comments)    Interacts with Lithium. " All steroids"  . Risperdal [Risperidone] Hives  . Tape Rash    "steri strips"  . Cortisone     Exacerbates the mania of her bipolar  . Naproxen     Interacts with lithium.   Marland Kitchen Percocet [Oxycodone-Acetaminophen] Other (See Comments)    Severe dizziness  . Other Rash    Throat swelling and rash to WALNUTS and PINE NUTS  . Peanuts [Peanut Oil] Rash    Throat swelling  . Sulfa Antibiotics Rash    Medication list has been reviewed and updated.  Current Outpatient Medications on File Prior to Visit  Medication Sig Dispense Refill  . albuterol (PROVENTIL HFA;VENTOLIN HFA) 108 (90 Base) MCG/ACT inhaler Inhale 1-2 puffs into the lungs every 6 (six) hours as needed for wheezing or shortness of breath. 1 each 3  . fluticasone furoate-vilanterol (BREO ELLIPTA) 100-25 MCG/INH AEPB Inhale 1 puff into the lungs daily.    . Fluticasone-Salmeterol (ADVAIR DISKUS) 100-50 MCG/DOSE AEPB Inhale 1 puff into the lungs 2 (two) times daily. 28 each 0  . Vitamin D, Ergocalciferol, (DRISDOL) 50000 units CAPS capsule Take 1 capsule (50,000 Units total) by mouth every 7 (seven) days. 12 capsule 0   No current facility-administered medications on file prior to visit.     Review of Systems:  As per HPI- otherwise negative.   Physical Examination: Vitals:   07/05/17 1213  BP: (!) 125/58  Pulse: 83  Resp: 16  Temp: 98.3 F (36.8 C)  SpO2: 100%   Vitals:   07/05/17 1213  Weight: 150 lb 9.6 oz (68.3 kg)  Height: 5\' 3"  (1.6 m)   Body mass index is 26.68 kg/m. Ideal Body Weight: Weight in (lb) to have BMI = 25: 140.8  GEN: WDWN, NAD, Non-toxic, A & O x 3, looks wlel HEENT:  Atraumatic, Normocephalic. Neck supple. No masses, No LAD.  Bilateral TM wnl, oropharynx normal.  PEERL,EOMI.   Ears and Nose: No external deformity. CV: RRR, No M/G/R. No JVD. No thrill. No extra heart sounds. PULM: CTA B, no wheezes, crackles, rhonchi. No retractions. No resp. distress. No accessory muscle use. EXTR: No c/c/e NEURO Normal gait.  PSYCH: Normally interactive. Conversant. Not depressed or anxious appearing.  Calm demeanor.  Right breast at 11:00: 2 very small and mobile masses- may be normal breast tissue vs axillary  nodes.  Left is normal No discharge or skin change   Assessment and Plan: Concern about breast cancer in female without diagnosis - Plan: MM DIAG BREAST TOMO UNI RIGHT, US BREAST COMPLETE UNI RIGHT INC AXILLA, CANCELED: MM DIAG BREAST TOMO UNI RIGHT, CANCELED: US BREAST LTD UNI RIGHT INC AXILLA  CLL (chronic lymphocytic leukemia) ([place]) - Plan: CBC  Chills  Concern of breast change today- recent normal mammo Will arrange diagnostic mammo for her asap CLL/ chills- check CBC for her today Counseled her that I do not want to start abx at this time based on her vague sx and getting her feet wet, but asked her to certainly stay in touch with me regarding her sx and we will check her CBC today as well She also needs a refill of her PRN levsin today  Signed Lamar Blinks, MD  Received her labs Results for orders placed or performed in visit on 07/05/17  CBC  Result Value Ref Range   WBC 16.7 (H) 4.0 - 10.5 K/uL   RBC 5.04 3.87 - 5.11 Mil/uL   Platelets 195.0 150.0 - 400.0 K/uL   Hemoglobin 14.0 12.0 - 15.0 g/dL   HCT 43.0 36.0 - 46.0 %   MCV 85.4 78.0 - 100.0 fl   MCHC 32.6 30.0 - 36.0 g/dL   RDW 14.2 11.5 - 15.5 %   Significant leukocytosis.  Will start her on azithromycin- she has several drug allergies   Your white blood cell count is high.  This may be due to CLL, or could indicate a bacterial infection.  I sent in an rx for a zpack to your drug  store today- this does treat pneumonia.   You can start on this asap I will also forward your results to Dr. Marin Olp Please reply here so I know that you got this message!   Called her on 12/19 to discuss- she has not yet filled the abx. She plans to do so No fever noted, she is actually feeling a bit better Next visit with hematology not until February- I will order a CBC with diff for her to do in the next week or so She will let me know if not doing ok

## 2017-07-05 ENCOUNTER — Encounter: Payer: Self-pay | Admitting: Family Medicine

## 2017-07-05 ENCOUNTER — Ambulatory Visit (INDEPENDENT_AMBULATORY_CARE_PROVIDER_SITE_OTHER): Payer: Medicare Other | Admitting: Family Medicine

## 2017-07-05 VITALS — BP 125/58 | HR 83 | Temp 98.3°F | Resp 16 | Ht 63.0 in | Wt 150.6 lb

## 2017-07-05 DIAGNOSIS — R6883 Chills (without fever): Secondary | ICD-10-CM

## 2017-07-05 DIAGNOSIS — Z711 Person with feared health complaint in whom no diagnosis is made: Secondary | ICD-10-CM | POA: Diagnosis not present

## 2017-07-05 DIAGNOSIS — C9190 Lymphoid leukemia, unspecified not having achieved remission: Secondary | ICD-10-CM

## 2017-07-05 DIAGNOSIS — C9110 Chronic lymphocytic leukemia of B-cell type not having achieved remission: Secondary | ICD-10-CM

## 2017-07-05 LAB — CBC
HEMATOCRIT: 43 % (ref 36.0–46.0)
Hemoglobin: 14 g/dL (ref 12.0–15.0)
MCHC: 32.6 g/dL (ref 30.0–36.0)
MCV: 85.4 fl (ref 78.0–100.0)
PLATELETS: 195 10*3/uL (ref 150.0–400.0)
RBC: 5.04 Mil/uL (ref 3.87–5.11)
RDW: 14.2 % (ref 11.5–15.5)
WBC: 16.7 10*3/uL — AB (ref 4.0–10.5)

## 2017-07-05 MED ORDER — AZITHROMYCIN 250 MG PO TABS: ORAL_TABLET | ORAL | 0 refills | Status: DC

## 2017-07-05 MED ORDER — HYOSCYAMINE SULFATE 0.125 MG PO TABS: 0.1250 mg | ORAL_TABLET | ORAL | 2 refills | Status: DC | PRN

## 2017-07-05 NOTE — Addendum Note (Signed)
Addended by: Lamar Blinks C on: 07/05/2017 08:51 PM   Modules accepted: Orders

## 2017-07-05 NOTE — Patient Instructions (Signed)
It was good to see you today- I will arrange a diagnostic mammogram for you asap to evaluate the area of concern in your right breast  Let me know if you don't hear about this appt soon Let's check your CBC today- let me know if you have any fever or worsening of your symptoms of illness

## 2017-07-07 NOTE — Addendum Note (Signed)
Addended by: Lamar Blinks C on: 07/07/2017 05:41 PM   Modules accepted: Orders

## 2017-07-15 ENCOUNTER — Encounter: Payer: Self-pay | Admitting: Family Medicine

## 2017-07-15 ENCOUNTER — Other Ambulatory Visit: Payer: Self-pay | Admitting: Family Medicine

## 2017-07-15 DIAGNOSIS — N6310 Unspecified lump in the right breast, unspecified quadrant: Secondary | ICD-10-CM

## 2017-07-23 ENCOUNTER — Ambulatory Visit
Admission: RE | Admit: 2017-07-23 | Discharge: 2017-07-23 | Disposition: A | Discharge status: Home / Self Care | Payer: Medicare Other | Source: Ambulatory Visit | Attending: Family Medicine | Admitting: Family Medicine

## 2017-07-23 ENCOUNTER — Other Ambulatory Visit: Payer: Self-pay | Admitting: Family Medicine

## 2017-07-23 DIAGNOSIS — R599 Enlarged lymph nodes, unspecified: Secondary | ICD-10-CM

## 2017-07-23 DIAGNOSIS — N6489 Other specified disorders of breast: Secondary | ICD-10-CM | POA: Diagnosis not present

## 2017-07-23 DIAGNOSIS — N6310 Unspecified lump in the right breast, unspecified quadrant: Secondary | ICD-10-CM

## 2017-07-23 DIAGNOSIS — R922 Inconclusive mammogram: Secondary | ICD-10-CM | POA: Diagnosis not present

## 2017-07-27 ENCOUNTER — Other Ambulatory Visit (INDEPENDENT_AMBULATORY_CARE_PROVIDER_SITE_OTHER): Payer: Medicare Other

## 2017-07-27 DIAGNOSIS — C9110 Chronic lymphocytic leukemia of B-cell type not having achieved remission: Secondary | ICD-10-CM

## 2017-07-27 DIAGNOSIS — C9190 Lymphoid leukemia, unspecified not having achieved remission: Secondary | ICD-10-CM

## 2017-07-28 LAB — CBC WITH DIFFERENTIAL/PLATELET
BASOS PCT: 0.8 % (ref 0.0–3.0)
Basophils Absolute: 0.1 10*3/uL (ref 0.0–0.1)
EOS ABS: 0.5 10*3/uL (ref 0.0–0.7)
Eosinophils Relative: 2.8 % (ref 0.0–5.0)
HCT: 43.7 % (ref 36.0–46.0)
Hemoglobin: 14.1 g/dL (ref 12.0–15.0)
LYMPHS ABS: 12.8 10*3/uL — AB (ref 0.7–4.0)
Lymphocytes Relative: 77.6 % — ABNORMAL HIGH (ref 12.0–46.0)
MCHC: 32.2 g/dL (ref 30.0–36.0)
MCV: 85.1 fl (ref 78.0–100.0)
MONO ABS: 0.4 10*3/uL (ref 0.1–1.0)
Monocytes Relative: 2.3 % — ABNORMAL LOW (ref 3.0–12.0)
NEUTROS PCT: 16.5 % — AB (ref 43.0–77.0)
Neutro Abs: 2.7 10*3/uL (ref 1.4–7.7)
Platelets: 206 10*3/uL (ref 150.0–400.0)
RBC: 5.13 Mil/uL — ABNORMAL HIGH (ref 3.87–5.11)
RDW: 14.3 % (ref 11.5–15.5)
WBC: 16.5 10*3/uL — AB (ref 4.0–10.5)

## 2017-07-29 ENCOUNTER — Encounter: Payer: Self-pay | Admitting: Family Medicine

## 2017-08-24 DIAGNOSIS — D224 Melanocytic nevi of scalp and neck: Secondary | ICD-10-CM | POA: Diagnosis not present

## 2017-08-24 DIAGNOSIS — L578 Other skin changes due to chronic exposure to nonionizing radiation: Secondary | ICD-10-CM | POA: Diagnosis not present

## 2017-08-24 DIAGNOSIS — L820 Inflamed seborrheic keratosis: Secondary | ICD-10-CM | POA: Diagnosis not present

## 2017-08-24 DIAGNOSIS — D225 Melanocytic nevi of trunk: Secondary | ICD-10-CM | POA: Diagnosis not present

## 2017-08-24 DIAGNOSIS — D1801 Hemangioma of skin and subcutaneous tissue: Secondary | ICD-10-CM | POA: Diagnosis not present

## 2017-09-08 ENCOUNTER — Encounter: Payer: Self-pay | Admitting: Hematology & Oncology

## 2017-09-08 ENCOUNTER — Inpatient Hospital Stay: Payer: Medicare Other | Attending: Hematology & Oncology

## 2017-09-08 ENCOUNTER — Other Ambulatory Visit: Payer: Self-pay

## 2017-09-08 ENCOUNTER — Inpatient Hospital Stay (HOSPITAL_BASED_OUTPATIENT_CLINIC_OR_DEPARTMENT_OTHER): Payer: Medicare Other | Admitting: Hematology & Oncology

## 2017-09-08 VITALS — BP 111/61 | HR 81 | Temp 98.4°F | Resp 16 | Wt 152.0 lb

## 2017-09-08 DIAGNOSIS — R599 Enlarged lymph nodes, unspecified: Secondary | ICD-10-CM

## 2017-09-08 DIAGNOSIS — C9110 Chronic lymphocytic leukemia of B-cell type not having achieved remission: Secondary | ICD-10-CM

## 2017-09-08 LAB — CMP (CANCER CENTER ONLY)
ALK PHOS: 56 U/L (ref 26–84)
ALT: 29 U/L (ref 10–47)
AST: 32 U/L (ref 11–38)
Albumin: 3.8 g/dL (ref 3.5–5.0)
Anion gap: 9 (ref 5–15)
BILIRUBIN TOTAL: 0.9 mg/dL (ref 0.2–1.6)
BUN: 15 mg/dL (ref 7–22)
CHLORIDE: 110 mmol/L — AB (ref 98–108)
CO2: 25 mmol/L (ref 18–33)
Calcium: 9.9 mg/dL (ref 8.0–10.3)
Creatinine: 1.2 mg/dL (ref 0.60–1.20)
Glucose, Bld: 120 mg/dL — ABNORMAL HIGH (ref 73–118)
POTASSIUM: 3.8 mmol/L (ref 3.3–4.7)
SODIUM: 144 mmol/L (ref 128–145)
Total Protein: 6.7 g/dL (ref 6.4–8.1)

## 2017-09-08 LAB — CBC WITH DIFFERENTIAL (CANCER CENTER ONLY)
BAND NEUTROPHILS: 0 %
BLASTS: 0 %
Basophils Absolute: 0 10*3/uL (ref 0.0–0.1)
Basophils Relative: 0 %
EOS ABS: 0.4 10*3/uL (ref 0.0–0.5)
Eosinophils Relative: 2 %
HCT: 42 % (ref 34.8–46.6)
HEMOGLOBIN: 13.8 g/dL (ref 11.6–15.9)
Lymphocytes Relative: 59 %
Lymphs Abs: 11.5 10*3/uL — ABNORMAL HIGH (ref 0.9–3.3)
MCH: 28.2 pg (ref 26.0–34.0)
MCHC: 32.9 g/dL (ref 32.0–36.0)
MCV: 85.7 fL (ref 81.0–101.0)
MONOS PCT: 11 %
MYELOCYTES: 0 %
Metamyelocytes Relative: 0 %
Monocytes Absolute: 2.1 10*3/uL — ABNORMAL HIGH (ref 0.1–0.9)
NEUTROS ABS: 5.5 10*3/uL (ref 1.5–6.5)
Neutrophils Relative %: 28 %
Other: 0 %
PLATELETS: 179 10*3/uL (ref 145–400)
Promyelocytes Absolute: 0 %
RBC: 4.9 MIL/uL (ref 3.70–5.32)
RDW: 14.1 % (ref 11.1–15.7)
WBC: 19.5 10*3/uL — AB (ref 3.9–10.0)
nRBC: 0 /100 WBC

## 2017-09-08 NOTE — Progress Notes (Signed)
Hematology and Oncology Follow Up Visit  Morgan [name] [id number] [birth year] 52 y.o. 09/08/2017   Principle Diagnosis:   CLL - Rai stage I  Current Therapy:    Observation     Interim History:  [name] is back for follow-up.  She is very excitable today.  She has noticed a bunch of lymph nodes that have been swollen.  She gets very anxious when this happens.  Her last saw her 6 months ago.  Overall, she has been doing pretty well.  She got a promotion at work.  She is quite happy about this.  She comes in with her daughter.  Her daughter moved over from [place].  She has had no fever.  She has had no bleeding.  She has had no cough or shortness of breath.  She has had no nausea or vomiting.  She has had some discomfort in the left lower abdomen.  I told her that we probably are going to have to do some scans on her to see exactly what is going on.  I can feel obvious lymph nodes on her neck and in her axilla.   Overall, her performance status is ECOG 0.  Medications:  Current Outpatient Medications:  .  albuterol (PROVENTIL HFA;VENTOLIN HFA) 108 (90 Base) MCG/ACT inhaler, Inhale 1-2 puffs into the lungs every 6 (six) hours as needed for wheezing or shortness of breath., Disp: 1 each, Rfl: 3 .  azithromycin (ZITHROMAX) 250 MG tablet, Use as a zpack, Disp: 6 tablet, Rfl: 0 .  fluticasone furoate-vilanterol (BREO ELLIPTA) 100-25 MCG/INH AEPB, Inhale 1 puff into the lungs daily., Disp: , Rfl:  .  Fluticasone-Salmeterol (ADVAIR DISKUS) 100-50 MCG/DOSE AEPB, Inhale 1 puff into the lungs 2 (two) times daily., Disp: 28 each, Rfl: 0 .  hyoscyamine (LEVSIN, ANASPAZ) 0.125 MG tablet, Take 1 tablet (0.125 mg total) by mouth as needed for cramping. Reported on 09/24/2015, Disp: 10 tablet, Rfl: 2 .  Vitamin D, Ergocalciferol, (DRISDOL) 50000 units CAPS capsule, Take 1 capsule (50,000 Units total) by mouth every 7 (seven) days., Disp: 12 capsule, Rfl: 0  Allergies:  Allergies  Allergen  Reactions  . Abilify [Aripiprazole] Swelling    Throat begins to swell   . Ciprofloxacin Shortness Of Breath  . Lamictal [Lamotrigine] Rash  . Amoxicillin Hives  . Doxycycline Hives  . Latex Rash  . Meloxicam Other (See Comments)    Induces mania  . Mirapex [Pramipexole] Hives  . Prednisone Other (See Comments)    Interacts with Lithium. " All steroids"  . Risperdal [Risperidone] Hives  . Tape Rash    "steri strips"  . Cortisone     Exacerbates the mania of her bipolar  . Naproxen     Interacts with lithium.   Marland Kitchen Percocet [Oxycodone-Acetaminophen] Other (See Comments)    Severe dizziness  . Other Rash    Throat swelling and rash to WALNUTS and PINE NUTS  . Peanuts [Peanut Oil] Rash    Throat swelling  . Sulfa Antibiotics Rash    Past Medical History, Surgical history, Social history, and Family History were reviewed and updated.  Review of Systems: Review of Systems  Constitutional: Negative.   HENT: Positive for nosebleeds.   Eyes: Negative.   Respiratory: Negative.   Cardiovascular: Negative.   Gastrointestinal: Positive for abdominal pain.  Genitourinary: Negative.   Musculoskeletal: Negative.   Skin: Positive for itching.  Neurological: Negative.   Endo/Heme/Allergies: Negative.   Psychiatric/Behavioral: Negative.     Physical Exam:  weight  is 152 lb (68.9 kg). Her oral temperature is 98.4 F (36.9 C). Her blood pressure is 111/61 and her pulse is 81. Her respiration is 16 and oxygen saturation is 100%.   Wt Readings from Last 3 Encounters:  09/08/17 152 lb (68.9 kg)  07/05/17 150 lb 9.6 oz (68.3 kg)  04/22/17 155 lb (70.3 kg)     Physical Exam  Constitutional: She is oriented to person, place, and time.  HENT:  Head: Normocephalic and atraumatic.  Mouth/Throat: Oropharynx is clear and moist.  She has some bilateral posterior cervical lymph nodes that are mobile and nontender.  Probably measure less than a centimeter.  Eyes: EOM are normal. Pupils  are equal, round, and reactive to light.  Neck: Normal range of motion.  Cardiovascular: Normal rate, regular rhythm and normal heart sounds.  Pulmonary/Chest: Effort normal and breath sounds normal.  Abdominal: Soft. Bowel sounds are normal.  Musculoskeletal: Normal range of motion. She exhibits no edema, tenderness or deformity.  Lymphadenopathy:    She has cervical adenopathy.  Neurological: She is alert and oriented to person, place, and time.  Skin: Skin is warm and dry. No rash noted. No erythema.  Psychiatric: She has a normal mood and affect. Her behavior is normal. Judgment and thought content normal.  Vitals reviewed.   Lab Results  Component Value Date   WBC 19.5 (H) 09/08/2017   HGB 14.1 07/27/2017   HCT 42.0 09/08/2017   MCV 85.7 09/08/2017   PLT 179 09/08/2017     Chemistry      Component Value Date/Time   NA 144 09/08/2017 1511   NA 142 03/10/2017 0954   NA 142 09/04/2016 0754   K 3.8 09/08/2017 1511   K 4.2 03/10/2017 0954   K 4.0 09/04/2016 0754   CL 110 (H) 09/08/2017 1511   CL 111 (H) 03/10/2017 0954   CO2 25 09/08/2017 1511   CO2 28 03/10/2017 0954   CO2 25 09/04/2016 0754   BUN 15 09/08/2017 1511   BUN 11 03/10/2017 0954   BUN 13.4 09/04/2016 0754   CREATININE 1.20 09/08/2017 1511   CREATININE 1.1 03/10/2017 0954   CREATININE 0.8 09/04/2016 0754      Component Value Date/Time   CALCIUM 9.9 09/08/2017 1511   CALCIUM 9.6 03/10/2017 0954   CALCIUM 9.7 09/04/2016 0754   ALKPHOS 56 09/08/2017 1511   ALKPHOS 46 03/10/2017 0954   ALKPHOS 54 09/04/2016 0754   AST 32 09/08/2017 1511   AST 20 09/04/2016 0754   ALT 29 09/08/2017 1511   ALT 20 03/10/2017 0954   ALT 14 09/04/2016 0754   BILITOT 0.9 09/08/2017 1511   BILITOT 0.39 09/04/2016 0754         Impression and Plan: [name] is a 52 year old white female.  She definitely has some lymph nodes.  This is a little bit unusual.  I am not sure that the lymph nodes are secondary to her CLL.   Her white cell count really is not that much higher.  Again, she does get quite nervous.  I think the CAT scans will really help [place] out.  I will go ahead and plan to get her back in another 6 weeks.  I would like to do the CT scan in 1 week. Volanda Napoleon, MD 2/20/20194:46 PM

## 2017-09-09 LAB — BETA 2 MICROGLOBULIN, SERUM: BETA 2 MICROGLOBULIN: 2.3 mg/L (ref 0.6–2.4)

## 2017-09-09 LAB — IGG, IGA, IGM
IGA: 24 mg/dL — AB (ref 87–352)
IGG (IMMUNOGLOBIN G), SERUM: 611 mg/dL — AB (ref 700–1600)
IGM (IMMUNOGLOBULIN M), SRM: 11 mg/dL — AB (ref 26–217)

## 2017-09-09 LAB — LACTATE DEHYDROGENASE: LDH: 233 U/L (ref 125–245)

## 2017-09-10 ENCOUNTER — Telehealth: Payer: Self-pay | Admitting: *Deleted

## 2017-09-10 LAB — PROTEIN ELECTROPHORESIS, SERUM, WITH REFLEX
A/G RATIO SPE: 2 — AB (ref 0.7–1.7)
Albumin ELP: 4.3 g/dL (ref 2.9–4.4)
Alpha-1-Globulin: 0.2 g/dL (ref 0.0–0.4)
Alpha-2-Globulin: 0.7 g/dL (ref 0.4–1.0)
Beta Globulin: 0.8 g/dL (ref 0.7–1.3)
GAMMA GLOBULIN: 0.5 g/dL (ref 0.4–1.8)
Globulin, Total: 2.2 g/dL (ref 2.2–3.9)
TOTAL PROTEIN ELP: 6.5 g/dL (ref 6.0–8.5)

## 2017-09-10 NOTE — Telephone Encounter (Signed)
Patient has scans scheduled for tomorrow. She'd like to know if we can also order a CT scan of the head to assess for lymphadenopathy. Explained to the patient that the CT scan of the neck is used to assess cervical and cranial lymph nodes and CT scans of the head were not indicated for lymphadenopathy.   Patient is aware of Dr Antonieta Pert response.

## 2017-09-11 ENCOUNTER — Encounter (HOSPITAL_BASED_OUTPATIENT_CLINIC_OR_DEPARTMENT_OTHER): Payer: Self-pay

## 2017-09-11 ENCOUNTER — Ambulatory Visit (HOSPITAL_BASED_OUTPATIENT_CLINIC_OR_DEPARTMENT_OTHER)
Admission: RE | Admit: 2017-09-11 | Discharge: 2017-09-11 | Disposition: A | Discharge status: Home / Self Care | Payer: Medicare Other | Source: Ambulatory Visit | Attending: Hematology & Oncology | Admitting: Hematology & Oncology

## 2017-09-11 DIAGNOSIS — C9190 Lymphoid leukemia, unspecified not having achieved remission: Secondary | ICD-10-CM | POA: Insufficient documentation

## 2017-09-11 DIAGNOSIS — C9110 Chronic lymphocytic leukemia of B-cell type not having achieved remission: Secondary | ICD-10-CM

## 2017-09-11 DIAGNOSIS — R590 Localized enlarged lymph nodes: Secondary | ICD-10-CM | POA: Insufficient documentation

## 2017-09-11 MED ORDER — IOPAMIDOL (ISOVUE-300) INJECTION 61%
100.0000 mL | Freq: Once | INTRAVENOUS | Status: AC | PRN
  Administered 2017-09-11: 100 mL via INTRAVENOUS

## 2017-09-15 ENCOUNTER — Other Ambulatory Visit (HOSPITAL_BASED_OUTPATIENT_CLINIC_OR_DEPARTMENT_OTHER): Payer: Self-pay

## 2017-09-20 ENCOUNTER — Telehealth: Payer: Self-pay | Admitting: *Deleted

## 2017-09-20 ENCOUNTER — Encounter: Payer: Self-pay | Admitting: Family

## 2017-09-20 ENCOUNTER — Other Ambulatory Visit: Payer: Self-pay

## 2017-09-20 ENCOUNTER — Inpatient Hospital Stay: Payer: Medicare Other | Attending: Family | Admitting: Family

## 2017-09-20 VITALS — BP 107/66 | HR 69 | Temp 98.6°F | Resp 16 | Wt 155.0 lb

## 2017-09-20 DIAGNOSIS — C9110 Chronic lymphocytic leukemia of B-cell type not having achieved remission: Secondary | ICD-10-CM | POA: Diagnosis not present

## 2017-09-20 DIAGNOSIS — R070 Pain in throat: Secondary | ICD-10-CM | POA: Insufficient documentation

## 2017-09-20 DIAGNOSIS — R110 Nausea: Secondary | ICD-10-CM | POA: Diagnosis not present

## 2017-09-20 MED ORDER — AZITHROMYCIN 250 MG PO TABS: ORAL_TABLET | ORAL | 0 refills | Status: DC

## 2017-09-20 MED ORDER — TURMERIC 500 MG PO CAPS: 500.0000 mg | ORAL_CAPSULE | Freq: Every day | ORAL | Status: DC

## 2017-09-20 NOTE — Progress Notes (Signed)
Hematology and Oncology Follow Up Visit  Morgan [name] [id number] 12-30-1965 52 y.o. 09/20/2017   Principle Diagnosis:  CLL - Rai stage I  Current Therapy:   Observation   Interim History:  Morgan [name] is here today with complaint of sore throat and nausea with no vomiting. She said her face feels full. Her tonsils are visible and a little red. No discharge visible. She has no c/o drainage.  She has palpable lymph nodes in the neck and axilla. CT scans last month which showed significant lymphadenopathy in the neck, chest, abdomen and pelvis. Her lab work has remained stable so we have just continued to follow along with her.  She has had some night and day sweats.  No fever, cough, rash, dizziness, SOB, chest pain, palpitations or changes in bowel or bladder habits.  She states that she is constipated and trying a new supplement for this. She has had some left abdominal pain.  No episodes of bleeding, no bruising or petechiae.  No swelling or tenderness in her extremities. She has tingling in her hands and feet.  Her appetite is good. She is hydrating well. Her weight is stable.   ECOG Performance Status: 1 - Symptomatic but completely ambulatory  Medications:  Allergies as of 09/20/2017      Reactions   Abilify [aripiprazole] Swelling   Throat begins to swell    Ciprofloxacin Shortness Of Breath   Lamictal [lamotrigine] Rash   Amoxicillin Hives   Doxycycline Hives   Latex Rash   Meloxicam Other (See Comments)   Induces mania   Mirapex [pramipexole] Hives   Prednisone Other (See Comments)   Interacts with Lithium. " All steroids"   Risperdal [risperidone] Hives   Tape Rash   "steri strips"   Cortisone    Exacerbates the mania of her bipolar   Naproxen    Interacts with lithium.    Percocet [oxycodone-acetaminophen] Other (See Comments)   Severe dizziness   Other Rash   Throat swelling and rash to WALNUTS and PINE NUTS   Peanuts [peanut Oil] Rash   Throat swelling   Sulfa Antibiotics Rash      Medication List        Accurate as of 09/20/17  3:56 PM. Always use your most recent med list.          albuterol 108 (90 Base) MCG/ACT inhaler Commonly known as:  PROVENTIL HFA;VENTOLIN HFA Inhale 1-2 puffs into the lungs every 6 (six) hours as needed for wheezing or shortness of breath.   azithromycin 250 MG tablet Commonly known as:  ZITHROMAX Use as a zpack   BREO ELLIPTA 100-25 MCG/INH Aepb Generic drug:  fluticasone furoate-vilanterol Inhale 1 puff into the lungs daily.   Fluticasone-Salmeterol 100-50 MCG/DOSE Aepb Commonly known as:  ADVAIR DISKUS Inhale 1 puff into the lungs 2 (two) times daily.   hyoscyamine 0.125 MG tablet Commonly known as:  LEVSIN, ANASPAZ Take 1 tablet (0.125 mg total) by mouth as needed for cramping. Reported on 09/24/2015   Turmeric 500 MG Caps Commonly known as:  [employer] TURMERIC CURCUMIN Take 500 mg by mouth daily.   Vitamin D (Ergocalciferol) 50000 units Caps capsule Commonly known as:  DRISDOL Take 1 capsule (50,000 Units total) by mouth every 7 (seven) days.       Allergies:  Allergies  Allergen Reactions  . Abilify [Aripiprazole] Swelling    Throat begins to swell   . Ciprofloxacin Shortness Of Breath  . Lamictal [Lamotrigine] Rash  . Amoxicillin Hives  .  Doxycycline Hives  . Latex Rash  . Meloxicam Other (See Comments)    Induces mania  . Mirapex [Pramipexole] Hives  . Prednisone Other (See Comments)    Interacts with Lithium. " All steroids"  . Risperdal [Risperidone] Hives  . Tape Rash    "steri strips"  . Cortisone     Exacerbates the mania of her bipolar  . Naproxen     Interacts with lithium.   Marland Kitchen Percocet [Oxycodone-Acetaminophen] Other (See Comments)    Severe dizziness  . Other Rash    Throat swelling and rash to WALNUTS and PINE NUTS  . Peanuts [Peanut Oil] Rash    Throat swelling  . Sulfa Antibiotics Rash    Past Medical History, Surgical history, Social history, and Family  History were reviewed and updated.  Review of Systems: All other 10 point review of systems is negative.   Physical Exam:  vitals were not taken for this visit.   Wt Readings from Last 3 Encounters:  09/08/17 152 lb (68.9 kg)  07/05/17 150 lb 9.6 oz (68.3 kg)  04/22/17 155 lb (70.3 kg)    Ocular: Sclerae unicteric, pupils equal, round and reactive to light Ear-nose-throat: Oropharynx clear, dentition fair Lymphatic: palpable cervical, supraclavicular and axillary adenopathy Lungs no rales or rhonchi, good excursion bilaterally Heart regular rate and rhythm, no murmur appreciated Abd soft, nontender, positive bowel sounds, no liver or spleen tip palpated on exam, no fluid wave  MSK no focal spinal tenderness, no joint edema Neuro: non-focal, well-oriented, appropriate affect Breasts: Deferred   Lab Results  Component Value Date   WBC 19.5 (H) 09/08/2017   HGB 14.1 07/27/2017   HCT 42.0 09/08/2017   MCV 85.7 09/08/2017   PLT 179 09/08/2017   No results found for: FERRITIN, IRON, TIBC, UIBC, IRONPCTSAT Lab Results  Component Value Date   RBC 4.90 09/08/2017   No results found for: Nils Pyle, [facility] Lab Results  Component Value Date   IGGSERUM 611 (L) 09/08/2017   IGA 24 (L) 09/08/2017   IGMSERUM 11 (L) 09/08/2017   Lab Results  Component Value Date   TOTALPROTELP 6.5 09/08/2017   ALBUMINELP 4.3 09/08/2017   A1GS 0.2 09/08/2017   A2GS 0.7 09/08/2017   BETS 0.8 09/08/2017   GAMS 0.5 09/08/2017   MSPIKE Not Observed 09/08/2017     Chemistry      Component Value Date/Time   NA 144 09/08/2017 1511   NA 142 03/10/2017 0954   NA 142 09/04/2016 0754   K 3.8 09/08/2017 1511   K 4.2 03/10/2017 0954   K 4.0 09/04/2016 0754   CL 110 (H) 09/08/2017 1511   CL 111 (H) 03/10/2017 0954   CO2 25 09/08/2017 1511   CO2 28 03/10/2017 0954   CO2 25 09/04/2016 0754   BUN 15 09/08/2017 1511   BUN 11 03/10/2017 0954   BUN 13.4 09/04/2016 0754    CREATININE 1.20 09/08/2017 1511   CREATININE 1.1 03/10/2017 0954   CREATININE 0.8 09/04/2016 0754      Component Value Date/Time   CALCIUM 9.9 09/08/2017 1511   CALCIUM 9.6 03/10/2017 0954   CALCIUM 9.7 09/04/2016 0754   ALKPHOS 56 09/08/2017 1511   ALKPHOS 46 03/10/2017 0954   ALKPHOS 54 09/04/2016 0754   AST 32 09/08/2017 1511   AST 20 09/04/2016 0754   ALT 29 09/08/2017 1511   ALT 20 03/10/2017 0954   ALT 14 09/04/2016 0754   BILITOT 0.9 09/08/2017 1511   BILITOT 0.39 09/04/2016  0754      Impression and Plan: Ms. Pomplun is a very pleasant 52 yo caucasian female with CLL and significant lymphadenopathy. She is here today with c/o sore throat and nausea.  I spoke with Dr. Marin Olp and we will have her try a Z-pack and see if this helps. If her symptoms do not improve we will look further into excision and biopsy of a lymph node and possibly starting treatment.  She is in agreement with the plan and will contact our office if she does not improve.  We can certainly see her sooner than 10/20/16 if need be.   Laverna Peace, NP 3/4/20193:56 PM

## 2017-09-20 NOTE — Telephone Encounter (Signed)
Patient is c/o increased lymphadenopathy to her face and neck. She says the left side is more prominent. She has stayed home all weekend due to swelling, slight nausea and sore throat.  She has started taking Curcumin 500mg  to help with inflammation.  Spoke with Dr Marin Olp. He wants patient to be seen by the nurse practitioner today, and if not possible per patient schedule, one day this week.   Patient is aware that scheduling will reach out to scheduled.

## 2017-09-23 ENCOUNTER — Encounter: Payer: Self-pay | Admitting: Family Medicine

## 2017-09-28 ENCOUNTER — Other Ambulatory Visit: Payer: Self-pay | Admitting: Family

## 2017-09-28 ENCOUNTER — Telehealth: Payer: Self-pay | Admitting: Family

## 2017-09-28 DIAGNOSIS — C9110 Chronic lymphocytic leukemia of B-cell type not having achieved remission: Secondary | ICD-10-CM

## 2017-09-28 NOTE — Telephone Encounter (Signed)
Lymph node excision and biopsy order placed per patient's request as she has not improved on antibiotics. Scheduling message sent to [facility] and she will follow-up for appointment.

## 2017-10-05 ENCOUNTER — Other Ambulatory Visit: Payer: Self-pay | Admitting: Radiology

## 2017-10-07 ENCOUNTER — Ambulatory Visit (HOSPITAL_COMMUNITY)
Admission: RE | Admit: 2017-10-07 | Discharge: 2017-10-07 | Disposition: A | Discharge status: Home / Self Care | Payer: Medicare Other | Source: Ambulatory Visit | Attending: Family | Admitting: Family

## 2017-10-07 ENCOUNTER — Encounter (HOSPITAL_COMMUNITY): Payer: Self-pay

## 2017-10-07 DIAGNOSIS — Z86010 Personal history of colonic polyps: Secondary | ICD-10-CM | POA: Diagnosis not present

## 2017-10-07 DIAGNOSIS — R590 Localized enlarged lymph nodes: Secondary | ICD-10-CM | POA: Insufficient documentation

## 2017-10-07 DIAGNOSIS — F319 Bipolar disorder, unspecified: Secondary | ICD-10-CM | POA: Diagnosis not present

## 2017-10-07 DIAGNOSIS — C9110 Chronic lymphocytic leukemia of B-cell type not having achieved remission: Secondary | ICD-10-CM | POA: Diagnosis not present

## 2017-10-07 DIAGNOSIS — R918 Other nonspecific abnormal finding of lung field: Secondary | ICD-10-CM | POA: Insufficient documentation

## 2017-10-07 DIAGNOSIS — Z881 Allergy status to other antibiotic agents status: Secondary | ICD-10-CM | POA: Insufficient documentation

## 2017-10-07 DIAGNOSIS — F603 Borderline personality disorder: Secondary | ICD-10-CM | POA: Insufficient documentation

## 2017-10-07 DIAGNOSIS — Z888 Allergy status to other drugs, medicaments and biological substances status: Secondary | ICD-10-CM | POA: Diagnosis not present

## 2017-10-07 DIAGNOSIS — Z87891 Personal history of nicotine dependence: Secondary | ICD-10-CM | POA: Diagnosis not present

## 2017-10-07 DIAGNOSIS — Z9049 Acquired absence of other specified parts of digestive tract: Secondary | ICD-10-CM | POA: Insufficient documentation

## 2017-10-07 DIAGNOSIS — F419 Anxiety disorder, unspecified: Secondary | ICD-10-CM | POA: Insufficient documentation

## 2017-10-07 DIAGNOSIS — Z886 Allergy status to analgesic agent status: Secondary | ICD-10-CM | POA: Diagnosis not present

## 2017-10-07 DIAGNOSIS — C8511 Unspecified B-cell lymphoma, lymph nodes of head, face, and neck: Secondary | ICD-10-CM | POA: Diagnosis not present

## 2017-10-07 DIAGNOSIS — Z560 Unemployment, unspecified: Secondary | ICD-10-CM | POA: Diagnosis not present

## 2017-10-07 DIAGNOSIS — Z880 Allergy status to penicillin: Secondary | ICD-10-CM | POA: Diagnosis not present

## 2017-10-07 DIAGNOSIS — J45909 Unspecified asthma, uncomplicated: Secondary | ICD-10-CM | POA: Insufficient documentation

## 2017-10-07 DIAGNOSIS — Z79899 Other long term (current) drug therapy: Secondary | ICD-10-CM | POA: Insufficient documentation

## 2017-10-07 DIAGNOSIS — Z885 Allergy status to narcotic agent status: Secondary | ICD-10-CM | POA: Diagnosis not present

## 2017-10-07 DIAGNOSIS — E039 Hypothyroidism, unspecified: Secondary | ICD-10-CM | POA: Diagnosis not present

## 2017-10-07 DIAGNOSIS — K589 Irritable bowel syndrome without diarrhea: Secondary | ICD-10-CM | POA: Diagnosis not present

## 2017-10-07 DIAGNOSIS — Z882 Allergy status to sulfonamides status: Secondary | ICD-10-CM | POA: Insufficient documentation

## 2017-10-07 LAB — CBC WITH DIFFERENTIAL/PLATELET
Basophils Absolute: 0 K/uL (ref 0.0–0.1)
Basophils Relative: 0 %
Eosinophils Absolute: 0.5 K/uL (ref 0.0–0.7)
Eosinophils Relative: 3 %
HCT: 42.8 % (ref 36.0–46.0)
Hemoglobin: 13.8 g/dL (ref 12.0–15.0)
Lymphocytes Relative: 69 %
Lymphs Abs: 11.8 K/uL — ABNORMAL HIGH (ref 0.7–4.0)
MCH: 27.8 pg (ref 26.0–34.0)
MCHC: 32.2 g/dL (ref 30.0–36.0)
MCV: 86.1 fL (ref 78.0–100.0)
Monocytes Absolute: 1.5 K/uL — ABNORMAL HIGH (ref 0.1–1.0)
Monocytes Relative: 9 %
Neutro Abs: 3.3 K/uL (ref 1.7–7.7)
Neutrophils Relative %: 19 %
Platelets: 196 K/uL (ref 150–400)
RBC: 4.97 MIL/uL (ref 3.87–5.11)
RDW: 14.1 % (ref 11.5–15.5)
WBC: 17.1 K/uL — ABNORMAL HIGH (ref 4.0–10.5)

## 2017-10-07 LAB — PROTIME-INR
INR: 0.88
Prothrombin Time: 11.9 seconds (ref 11.4–15.2)

## 2017-10-07 MED ORDER — MIDAZOLAM HCL 2 MG/2ML IJ SOLN
INTRAMUSCULAR | Status: AC
  Filled 2017-10-07: qty 4

## 2017-10-07 MED ORDER — FENTANYL CITRATE (PF) 100 MCG/2ML IJ SOLN
INTRAMUSCULAR | Status: AC | PRN
  Administered 2017-10-07 (×2): 50 ug via INTRAVENOUS

## 2017-10-07 MED ORDER — ACETAMINOPHEN 325 MG PO TABS
650.0000 mg | ORAL_TABLET | Freq: Four times a day (QID) | ORAL | Status: DC | PRN
  Administered 2017-10-07: 650 mg via ORAL
  Filled 2017-10-07: qty 2

## 2017-10-07 MED ORDER — FENTANYL CITRATE (PF) 100 MCG/2ML IJ SOLN
INTRAMUSCULAR | Status: AC
  Filled 2017-10-07: qty 4

## 2017-10-07 MED ORDER — MIDAZOLAM HCL 2 MG/2ML IJ SOLN
INTRAMUSCULAR | Status: AC | PRN
  Administered 2017-10-07 (×2): 1 mg via INTRAVENOUS

## 2017-10-07 MED ORDER — SODIUM CHLORIDE 0.9 % IV SOLN
INTRAVENOUS | Status: DC
  Administered 2017-10-07: 12:00:00 via INTRAVENOUS

## 2017-10-07 MED ORDER — LIDOCAINE HCL 1 % IJ SOLN
INTRAMUSCULAR | Status: AC
  Filled 2017-10-07: qty 20

## 2017-10-07 NOTE — Procedures (Signed)
Interventional Radiology Procedure Note  Procedure: US guided core biopsy of left cervical LN  Complications: None  Estimated Blood Loss: None  Recommendations: - Bedrest x 1 hr - DC home  Signed,  Criselda Peaches, MD

## 2017-10-07 NOTE — Discharge Instructions (Signed)
Moderate Conscious Sedation, Adult, Care After °These instructions provide you with information about caring for yourself after your procedure. Your health care provider may also give you more specific instructions. Your treatment has been planned according to current medical practices, but problems sometimes occur. Call your health care provider if you have any problems or questions after your procedure. °What can I expect after the procedure? °After your procedure, it is common: °· To feel sleepy for several hours. °· To feel clumsy and have poor balance for several hours. °· To have poor judgment for several hours. °· To vomit if you eat too soon. ° °Follow these instructions at home: °For at least 24 hours after the procedure: ° °· Do not: °? Participate in activities where you could fall or become injured. °? Drive. °? Use heavy machinery. °? Drink alcohol. °? Take sleeping pills or medicines that cause drowsiness. °? Make important decisions or sign legal documents. °? Take care of children on your own. °· Rest. °Eating and drinking °· Follow the diet recommended by your health care provider. °· If you vomit: °? Drink water, juice, or soup when you can drink without vomiting. °? Make sure you have little or no nausea before eating solid foods. °General instructions °· Have a responsible adult stay with you until you are awake and alert. °· Take over-the-counter and prescription medicines only as told by your health care provider. °· If you smoke, do not smoke without supervision. °· Keep all follow-up visits as told by your health care provider. This is important. °Contact a health care provider if: °· You keep feeling nauseous or you keep vomiting. °· You feel light-headed. °· You develop a rash. °· You have a fever. °Get help right away if: °· You have trouble breathing. °This information is not intended to replace advice given to you by your health care provider. Make sure you discuss any questions you have  with your health care provider. °Document Released: 04/26/2013 Document Revised: 12/09/2015 Document Reviewed: 10/26/2015 °Elsevier Interactive Patient Education © 2018 [employer] ° ° °Needle Biopsy, Care After °These instructions give you information about caring for yourself after your procedure. Your doctor may also give you more specific instructions. Call your doctor if you have any problems or questions after your procedure. °Follow these instructions at home: °· Rest as told by your doctor. °· Take medicines only as told by your doctor. °· There are many different ways to close and cover the biopsy site, including stitches (sutures), skin glue, and adhesive strips. Follow instructions from your doctor about: °? How to take care of your biopsy site. °? When and how you should change your bandage (dressing). °? When you should remove your dressing. °? Removing whatever was used to close your biopsy site.  You may remove your dressing tomorrow. °· Check your biopsy site every day for signs of infection. Watch for: °? Redness, swelling, or pain. °? Fluid, blood, or pus. °Contact a doctor if: °· You have a fever. °· You have redness, swelling, or pain at the biopsy site, and it lasts longer than a few days. °· You have fluid, blood, or pus coming from the biopsy site. °· You feel sick to your stomach (nauseous). °· You throw up (vomit). °Get help right away if: °· You are short of breath. °· You have trouble breathing. °· Your chest hurts. °· You feel dizzy or you pass out (faint). °· You have bleeding that does not stop with pressure or   a bandage. °· You cough up blood. °· Your belly (abdomen) hurts. °This information is not intended to replace advice given to you by your health care provider. Make sure you discuss any questions you have with your health care provider. °Document Released: 06/18/2008 Document Revised: 12/12/2015 Document Reviewed: 07/02/2014 °Elsevier Interactive Patient Education © 2018  [employer] ° °

## 2017-10-07 NOTE — Consult Note (Addendum)
Chief Complaint: Patient was seen in consultation today for image guided left inguinal vs cervical vs axillary lymph node biopsy  Referring Physician(s): [place] M/Ennever,P  Supervising Physician: Jacqulynn Cadet  Patient Status: [facility] - Out-pt  History of Present Illness: Morgan [name] is a 52 y.o. female with history of CLL, initially diagnosed in 2018, who now presents with follow-up imaging revealing increased widespread lymphadenopathy in the bilateral supraclavicular neck, bilateral axilla, retroperitoneum and bilateral pelvic regions compatible with progression of lymphoma.  She presents today for image guided  lymph node biopsy for further evaluation.  Past Medical History:  Diagnosis Date  . Anxiety   . Arthritis   . Asthma   . Bipolar disorder ([place])   . Bulging lumbar disc 07/19/05  . Chronic headaches   . Chronic lymphocytic leukemia ([place]) 07/31/2016  . Colon polyps   . Degenerative disorder of bone   . Depression   . History of borderline personality disorder   . Hypothyroidism 2007   Developed after use of Lithium  . IBS (irritable bowel syndrome) 1995  . Pneumonia   . Proctitis   . Substance abuse ([place])    ativan last month    Past Surgical History:  Procedure Laterality Date  . ABDOMINAL HYSTERECTOMY    . APPENDECTOMY    . BUNIONECTOMY Right 06/2012  . CHOLECYSTECTOMY    . OOPHORECTOMY    . SHOULDER OPEN ROTATOR CUFF REPAIR Right 03/2010  . TUBAL LIGATION      Allergies: Abilify [aripiprazole]; Ciprofloxacin; Lamictal [lamotrigine]; Amoxicillin; Doxycycline; Latex; Meloxicam; Mirapex [pramipexole]; Prednisone; Risperdal [risperidone]; Tape; Cortisone; Naproxen; Percocet [oxycodone-acetaminophen]; Other; Peanuts [peanut oil]; and Sulfa antibiotics  Medications: Prior to Admission medications   Medication Sig Start Date End Date Taking? Authorizing Provider  albuterol (PROVENTIL HFA;VENTOLIN HFA) 108 (90 Base) MCG/ACT inhaler Inhale  1-2 puffs into the lungs every 6 (six) hours as needed for wheezing or shortness of breath. 06/22/16   Copland, Gay Filler, MD  azithromycin (ZITHROMAX) 250 MG tablet Take as directed on package. 09/20/17   [place], Holli Humbles, NP  fluticasone furoate-vilanterol (BREO ELLIPTA) 100-25 MCG/INH AEPB Inhale 1 puff into the lungs daily.    [provider]  Fluticasone-Salmeterol (ADVAIR DISKUS) 100-50 MCG/DOSE AEPB Inhale 1 puff into the lungs 2 (two) times daily. 10/29/16   Rigoberto Noel, MD  hyoscyamine (LEVSIN, ANASPAZ) 0.125 MG tablet Take 1 tablet (0.125 mg total) by mouth as needed for cramping. Reported on 09/24/2015 07/05/17   Copland, Gay Filler, MD  Turmeric ([employer] TURMERIC CURCUMIN) 500 MG CAPS Take 500 mg by mouth daily. 09/20/17   Volanda Napoleon, MD  Vitamin D, Ergocalciferol, (DRISDOL) 50000 units CAPS capsule Take 1 capsule (50,000 Units total) by mouth every 7 (seven) days. 04/22/17   [name], DO     Family History  Problem Relation Age of Onset  . Depression Mother   . Hypertension Mother   . Post-traumatic stress disorder Sister   . Alcohol abuse Brother   . Drug abuse Brother   . Post-traumatic stress disorder Brother   . Thyroid disease Father   . Alzheimer's disease Father   . COPD Maternal Grandmother   . Heart disease Maternal Grandfather   . Arthritis Paternal Grandmother   . Diabetes Paternal Grandmother   . Depression Paternal Grandmother   . Alzheimer's disease Paternal Grandmother   . Heart disease Paternal Grandfather   . Coronary artery disease Paternal Grandfather   . Alcohol abuse Paternal Grandfather   . Colon  cancer Neg Hx   . Breast cancer Neg Hx     Social History   Socioeconomic History  . Marital status: Divorced    Spouse name: Not on file  . Number of children: 2  . Years of education: 98  . Highest education level: Not on file  Occupational History  . Occupation: Unemployed  Social Needs  . Financial resource strain: Not on file    . Food insecurity:    Worry: Not on file    Inability: Not on file  . Transportation needs:    Medical: Not on file    Non-medical: Not on file  Tobacco Use  . Smoking status: Former Smoker    Last attempt to quit: 07/20/1980    Years since quitting: 37.2  . Smokeless tobacco: Never Used  Substance and Sexual Activity  . Alcohol use: No    Alcohol/week: 0.0 oz    Comment: unsure  . Drug use: Yes    Types: Benzodiazepines  . Sexual activity: Never    Birth control/protection: Surgical    Comment: intercourse age unknown,sexual partners less than 5  Lifestyle  . Physical activity:    Days per week: Not on file    Minutes per session: Not on file  . Stress: Not on file  Relationships  . Social connections:    Talks on phone: Not on file    Gets together: Not on file    Attends religious service: Not on file    Active member of club or organization: Not on file    Attends meetings of clubs or organizations: Not on file    Relationship status: Not on file  Other Topics Concern  . Not on file  Social History Narrative   Fun: [place], travel, music, writing, walking, hiking, volunteering   Denies religious beliefs effecting health care.    Feels safe at home.       Review of Systems denies fever, headache, chest pain, dyspnea, back pain, vomiting or bleeding.  She does have occasional cough, some left lower abdominal/inguinal discomfort, occasional nausea and sweats.  Vital Signs:pend   Physical Exam awake, alert.  Chest clear to auscultation bilaterally.  Heart with regular rate and rhythm.  Abdomen soft, positive bowel sounds, mildly tender left lower quadrant; no lower extremity edema; patient with cervical, supraclavicular and axillary adenopathy  Imaging: Ct Soft Tissue Neck W Contrast  Result Date: 09/11/2017 CLINICAL DATA:  Chronic lymphocytic leukemia with sensation of adenopathy in the submandibular and neck region. EXAM: CT NECK WITH CONTRAST TECHNIQUE:  Multidetector CT imaging of the neck was performed using the standard protocol following the bolus administration of intravenous contrast. CONTRAST:  100 cc Isovue-300 COMPARISON:  08/26/2016 FINDINGS: Pharynx and larynx: No mucosal or submucosal lesion is seen. Salivary glands: Parotid and submandibular glands are intrinsically normal. Small parotid nodes are more prominent. Thyroid: No thyroid mass. Left lobe asymmetrically larger than the right. No change. Lymph nodes: Increased in lymph node size at all nodal stations throughout both sides of the neck. In general, volume of the lymph nodes have approximately doubled throughout. This includes all nodal stations level 1 through level 6. Index nodes are as follows. Right occipital node image 28 increased from 6 mm to 9 mm. Left level 2 node image 61 increased from 10 mm to 12 mm. Left level 5 node image 61 increased from 9 x 12 mm to 12 x 18 mm. Right level 1 node image 67 increased from 5 mm to  10 mm. Right level 5 node image 70 increased from 9 x 14 mm to 11 x 21 mm. Extensive adenopathy also progressive along the maxillary regions and subclavian regions bilaterally, described at chest CT. Vascular: Arterial and venous structures are patent. No venous compression. Limited intracranial: Normal Visualized orbits: Normal Mastoids and visualized paranasal sinuses: Clear Skeleton: Ordinary cervical spondylosis. Possible fibrous dysplasia of the clivus. Upper chest: See results of chest CT. Other: None IMPRESSION: Enlargement of all nodal tissue in a bilaterally symmetric fashion throughout the neck region. Index nodes measured as above. In general, the nodal volume has probably at least doubled. Electronically Signed   By: Nelson Chimes M.D.   On: 09/11/2017 13:18   Ct Chest W Contrast  Result Date: 09/11/2017 CLINICAL DATA:  CLL/SLL.  Restaging. EXAM: CT CHEST, ABDOMEN, AND PELVIS WITH CONTRAST TECHNIQUE: Multidetector CT imaging of the chest, abdomen and pelvis  was performed following the standard protocol during bolus administration of intravenous contrast. CONTRAST:  163mL ISOVUE-300 IOPAMIDOL (ISOVUE-300) INJECTION 61% COMPARISON:  09/05/2016 CT chest, abdomen and pelvis. FINDINGS: CT CHEST FINDINGS Cardiovascular: Normal heart size. No significant pericardial fluid/thickening. Great vessels are normal in course and caliber. No central pulmonary emboli. Mediastinum/Nodes: No discrete thyroid nodules. Unremarkable esophagus. Moderate bilateral axillary adenopathy is increased. Representative 1.9 cm right axillary node (series 2/image 13), increased from 1.3 cm. Representative 1.8 cm left axillary node (series 2/image 16), increased from 1.0 cm. New mild bilateral supraclavicular adenopathy, largest 1.1 cm on the right (series 2/image 3). Remnant partially fat-degenerated thymic tissue in the anterior mediastinum is stable. No mediastinal or hilar adenopathy. Lungs/Pleura: No pneumothorax. No pleural effusion. No acute consolidative airspace disease or lung masses. A few scattered small solid pulmonary nodules in both lungs, largest 5 mm in the medial right upper lobe (series 4/image 43), all stable since 09/05/2016 CT, considered benign. No new significant pulmonary nodules. Musculoskeletal: No aggressive appearing focal osseous lesions. Mild thoracic spondylosis. CT ABDOMEN PELVIS FINDINGS Hepatobiliary: Normal liver size. Scattered simple liver cysts, largest 2.3 cm in the left liver lobe. Scattered subcentimeter hypodense liver lesions throughout the liver are too small to characterize and not appreciably changed, considered benign. No new liver lesions. Cholecystectomy. Bile ducts are stable and within normal post cholecystectomy limits. Pancreas: Normal, with no mass or duct dilation. Spleen: Normal size. No mass. Adrenals/Urinary Tract: Normal adrenals. Normal kidneys with no hydronephrosis and no renal mass. Normal bladder. Stomach/Bowel: Normal non-distended  stomach. Normal caliber small bowel with no small bowel wall thickening. Appendectomy. Normal large bowel with no diverticulosis, large bowel wall thickening or pericolonic fat stranding. Oral contrast transits to the descending colon. Vascular/Lymphatic: Normal caliber abdominal aorta. Patent portal, splenic, hepatic and renal veins. Enlarged 1.7 cm porta hepatis node (series 2/image 54), increased from 1.4 cm. New left para-aortic adenopathy measuring up to 1.0 cm (series 2/image 63). Newly enlarged 1.4 cm aortocaval node (series 2/image 73). Increased bilateral external iliac adenopathy. Representative left external iliac 2.3 cm node (series 2/image 105), increased from 1.6 cm. Reproductive: Status post hysterectomy, with no abnormal findings at the vaginal cuff. No adnexal mass. Other: No pneumoperitoneum, ascites or focal fluid collection. Musculoskeletal: No aggressive appearing focal osseous lesions. Moderate lower lumbar spondylosis, most prominent at L5-S1. IMPRESSION: Interval increased widespread lymphadenopathy in the bilateral supraclavicular neck, bilateral axilla, retroperitoneum and bilateral pelvis, compatible with progression of lymphoma. Electronically Signed   By: Ilona Sorrel M.D.   On: 09/11/2017 14:02   Ct Abdomen Pelvis W Contrast  Result  Date: 09/11/2017 CLINICAL DATA:  CLL/SLL.  Restaging. EXAM: CT CHEST, ABDOMEN, AND PELVIS WITH CONTRAST TECHNIQUE: Multidetector CT imaging of the chest, abdomen and pelvis was performed following the standard protocol during bolus administration of intravenous contrast. CONTRAST:  154mL ISOVUE-300 IOPAMIDOL (ISOVUE-300) INJECTION 61% COMPARISON:  09/05/2016 CT chest, abdomen and pelvis. FINDINGS: CT CHEST FINDINGS Cardiovascular: Normal heart size. No significant pericardial fluid/thickening. Great vessels are normal in course and caliber. No central pulmonary emboli. Mediastinum/Nodes: No discrete thyroid nodules. Unremarkable esophagus. Moderate  bilateral axillary adenopathy is increased. Representative 1.9 cm right axillary node (series 2/image 13), increased from 1.3 cm. Representative 1.8 cm left axillary node (series 2/image 16), increased from 1.0 cm. New mild bilateral supraclavicular adenopathy, largest 1.1 cm on the right (series 2/image 3). Remnant partially fat-degenerated thymic tissue in the anterior mediastinum is stable. No mediastinal or hilar adenopathy. Lungs/Pleura: No pneumothorax. No pleural effusion. No acute consolidative airspace disease or lung masses. A few scattered small solid pulmonary nodules in both lungs, largest 5 mm in the medial right upper lobe (series 4/image 43), all stable since 09/05/2016 CT, considered benign. No new significant pulmonary nodules. Musculoskeletal: No aggressive appearing focal osseous lesions. Mild thoracic spondylosis. CT ABDOMEN PELVIS FINDINGS Hepatobiliary: Normal liver size. Scattered simple liver cysts, largest 2.3 cm in the left liver lobe. Scattered subcentimeter hypodense liver lesions throughout the liver are too small to characterize and not appreciably changed, considered benign. No new liver lesions. Cholecystectomy. Bile ducts are stable and within normal post cholecystectomy limits. Pancreas: Normal, with no mass or duct dilation. Spleen: Normal size. No mass. Adrenals/Urinary Tract: Normal adrenals. Normal kidneys with no hydronephrosis and no renal mass. Normal bladder. Stomach/Bowel: Normal non-distended stomach. Normal caliber small bowel with no small bowel wall thickening. Appendectomy. Normal large bowel with no diverticulosis, large bowel wall thickening or pericolonic fat stranding. Oral contrast transits to the descending colon. Vascular/Lymphatic: Normal caliber abdominal aorta. Patent portal, splenic, hepatic and renal veins. Enlarged 1.7 cm porta hepatis node (series 2/image 54), increased from 1.4 cm. New left para-aortic adenopathy measuring up to 1.0 cm (series 2/image  63). Newly enlarged 1.4 cm aortocaval node (series 2/image 73). Increased bilateral external iliac adenopathy. Representative left external iliac 2.3 cm node (series 2/image 105), increased from 1.6 cm. Reproductive: Status post hysterectomy, with no abnormal findings at the vaginal cuff. No adnexal mass. Other: No pneumoperitoneum, ascites or focal fluid collection. Musculoskeletal: No aggressive appearing focal osseous lesions. Moderate lower lumbar spondylosis, most prominent at L5-S1. IMPRESSION: Interval increased widespread lymphadenopathy in the bilateral supraclavicular neck, bilateral axilla, retroperitoneum and bilateral pelvis, compatible with progression of lymphoma. Electronically Signed   By: Ilona Sorrel M.D.   On: 09/11/2017 14:02    Labs:  CBC: Recent Labs    04/14/17 1317 07/05/17 1238 07/27/17 1405 09/08/17 1511 10/07/17 1121  WBC 10.8* 16.7* 16.5* 19.5* 17.1*  HGB 14.0 14.0 14.1  --  13.8  HCT 43.0 43.0 43.7 42.0 42.8  PLT 187.0 195.0 206.0 179 196    COAGS: Recent Labs    10/07/17 1121  INR 0.88    BMP: Recent Labs    12/10/16 1510 03/10/17 0954 09/08/17 1511  NA 135 142 144  K 3.9 4.2 3.8  CL 101 111* 110*  CO2 28 28 25   GLUCOSE 87 83 120*  BUN 13 11 15   CALCIUM 9.8 9.6 9.9  CREATININE 1.2 1.1 1.20    LIVER FUNCTION TESTS: Recent Labs    12/10/16 1510 03/10/17 0954 03/10/17 0954  09/08/17 1511  BILITOT 0.70 0.90  --  0.9  AST 25 29  --  32  ALT 19 20  --  29  ALKPHOS 51 46  --  56  PROT 6.6  7.0 6.8 6.3 6.7  ALBUMIN 4.1 3.7  --  3.8    TUMOR MARKERS: No results for input(s): AFPTM, CEA, CA199, CHROMGRNA in the last 8760 hours.  Assessment and Plan: 52 y.o. female with history of CLL, initially diagnosed in 2018, who now presents with follow-up imaging revealing increased widespread lymphadenopathy in the bilateral supraclavicular neck, bilateral axilla, retroperitoneum and bilateral pelvic regions compatible with progression of  lymphoma.  She presents today for image guided lymph node biopsy for further evaluation.Risks and benefits discussed with the patient/daughter including, but not limited to bleeding, infection, damage to adjacent structures or low yield requiring additional tests.  All of the patient's questions were answered, patient is agreeable to proceed. Consent signed and in chart.     Thank you for this interesting consult.  I greatly enjoyed meeting Yuritzy Muzquiz and look forward to participating in their care.  A copy of this report was sent to the requesting provider on this date.  Electronically Signed: D. Rowe Robert, PA-C 10/07/2017, 12:55 PM   I spent a total of 20 minutes    in face to face in clinical consultation, greater than 50% of which was counseling/coordinating care for image guided left inguinal lymph node biopsy

## 2017-10-19 ENCOUNTER — Other Ambulatory Visit: Payer: Self-pay | Admitting: *Deleted

## 2017-10-19 DIAGNOSIS — C9110 Chronic lymphocytic leukemia of B-cell type not having achieved remission: Secondary | ICD-10-CM

## 2017-10-20 ENCOUNTER — Encounter: Payer: Self-pay | Admitting: Hematology & Oncology

## 2017-10-20 ENCOUNTER — Inpatient Hospital Stay: Payer: Medicare Other

## 2017-10-20 ENCOUNTER — Inpatient Hospital Stay: Payer: Medicare Other | Attending: Family | Admitting: Hematology & Oncology

## 2017-10-20 ENCOUNTER — Other Ambulatory Visit: Payer: Self-pay

## 2017-10-20 VITALS — BP 113/68 | HR 77 | Temp 98.5°F | Resp 18 | Wt 157.0 lb

## 2017-10-20 DIAGNOSIS — C8308 Small cell B-cell lymphoma, lymph nodes of multiple sites: Secondary | ICD-10-CM | POA: Diagnosis not present

## 2017-10-20 DIAGNOSIS — C9110 Chronic lymphocytic leukemia of B-cell type not having achieved remission: Secondary | ICD-10-CM | POA: Diagnosis not present

## 2017-10-20 LAB — CBC WITH DIFFERENTIAL (CANCER CENTER ONLY)
BASOS ABS: 0 10*3/uL (ref 0.0–0.1)
BASOS PCT: 0 %
BLASTS: 0 %
Band Neutrophils: 0 %
EOS ABS: 0.8 10*3/uL — AB (ref 0.0–0.5)
Eosinophils Relative: 3 %
HCT: 43.9 % (ref 34.8–46.6)
Hemoglobin: 14 g/dL (ref 11.6–15.9)
LYMPHS ABS: 20.4 10*3/uL — AB (ref 0.9–3.3)
LYMPHS PCT: 77 %
MCH: 27.7 pg (ref 26.0–34.0)
MCHC: 31.9 g/dL — ABNORMAL LOW (ref 32.0–36.0)
MCV: 86.8 fL (ref 81.0–101.0)
MONO ABS: 1.9 10*3/uL — AB (ref 0.1–0.9)
MYELOCYTES: 0 %
Metamyelocytes Relative: 0 %
Monocytes Relative: 7 %
NEUTROS PCT: 13 %
NRBC: 0 /100{WBCs}
Neutro Abs: 3.5 10*3/uL (ref 1.5–6.5)
OTHER: 0 %
PLATELETS: 186 10*3/uL (ref 145–400)
PROMYELOCYTES ABS: 0 %
RBC: 5.06 MIL/uL (ref 3.70–5.32)
RDW: 14 % (ref 11.1–15.7)
WBC Count: 26.6 10*3/uL — ABNORMAL HIGH (ref 3.9–10.0)

## 2017-10-20 LAB — CMP (CANCER CENTER ONLY)
ALBUMIN: 3.8 g/dL (ref 3.5–5.0)
ALK PHOS: 61 U/L (ref 26–84)
ALT: 32 U/L (ref 10–47)
ANION GAP: 11 (ref 5–15)
AST: 40 U/L — AB (ref 11–38)
BUN: 13 mg/dL (ref 7–22)
CALCIUM: 9.6 mg/dL (ref 8.0–10.3)
CO2: 28 mmol/L (ref 18–33)
CREATININE: 1 mg/dL (ref 0.60–1.20)
Chloride: 106 mmol/L (ref 98–108)
Glucose, Bld: 96 mg/dL (ref 73–118)
Potassium: 4.7 mmol/L (ref 3.3–4.7)
Sodium: 145 mmol/L (ref 128–145)
Total Bilirubin: 0.8 mg/dL (ref 0.2–1.6)
Total Protein: 7 g/dL (ref 6.4–8.1)

## 2017-10-20 NOTE — Progress Notes (Signed)
Hematology and Oncology Follow Up Visit  Morgan [name] [id number] 05/31/1966 52 y.o. 10/20/2017   Principle Diagnosis:  Small Lymphocytic B-cell NHL  Current Therapy:   Observation   Interim History:  [name] is here today to talk about the results of her lymph node biopsy.  She underwent biopsy of a left cervical lymph node back on 10/07/2017.  This was because she had noted increased adenopathy in her neck and inguinal region.  The pathology report ([id number]) showed small lymphocytic B cell non-Hodgkin's lymphoma.  This basically is a lymph node version of CLL.  She had scans done.  The CT scans showed widespread adenopathy in the neck, axilla, retroperitoneum and pelvis.  Surprisingly, she had no mediastinal or hilar lymph nodes.  She comes in with her daughter and friend.  She is incredibly emotional.  She does have a history of bipolar disorder.  She is very worried about taking any treatment.  She thinks that her body will have a bad reaction to treatment.  She says that her body is different than other patients and she is not sure that she could tolerate treatment.  I told her that I would utilize a very recognized therapy for her lymphoma.  I would utilize Rituxan/bendamustine.  This is very tolerable.  The vast majority of patients who take this have [place] no problems with this and the chance of responding is over 90%.  I gave her information sheets about each medicine.  She then wanted to know about a second opinion and clinical trial consideration.  I told her that this certainly could be a possibility.  I told her that she probably would need to have more studies done (i.e. bone marrow biopsy) in order to qualify for a clinical trial.  From my perspective, I really do not think that a bone marrow biopsy would alter recommendations for her.  She then wanted to know about genetics of her lymphoma.  I said that genetics were not run on lymphoma tissue.  We will send off  cytogenetics and FISH panel on her peripheral blood since she has 26,000 white blood cells.  I spent over an hour with her.  I told her that with her form of lymphoma, which would be considered a low grade lymphoma, we certainly have time to take before we have to embark upon any treatment.  She works.  She is worried about having to pay for these treatments.  I fully understand this and did not want any treatment to pose a financial burden to her.  She says that she has some hoarseness.  She says that she has some shortness of breath.  I really do not think this is from [place].  She has no mediastinal lymphoma that would affect her recurrent laryngeal nerve that activates her larynx.  She says that she has pain in the left lower back.  I told her this might be adenopathy.  I do not think is her kidneys.  She says that she has tingling in her fingers.  She thinks that this is also from lymphoma pressing on nerves.  By the CT scan reports, I do not see any mention of lymphadenopathy involving nerves.  Overall, I think that she still has a good performance status.  I think her performance status is ECOG 1.   Medications:  Allergies as of 10/20/2017      Reactions   Abilify [aripiprazole] Swelling   Throat begins to swell    Ciprofloxacin Shortness Of Breath  Lamictal [lamotrigine] Rash   Amoxicillin Hives   Doxycycline Hives   Latex Rash   Meloxicam Other (See Comments)   Induces mania   Mirapex [pramipexole] Hives   Prednisone Other (See Comments)   Interacts with Lithium. " All steroids"   Risperdal [risperidone] Hives   Tape Rash   "steri strips"   Cortisone    Exacerbates the mania of her bipolar   Naproxen    Interacts with lithium.    Percocet [oxycodone-acetaminophen] Other (See Comments)   Severe dizziness   Other Rash   Throat swelling and rash to WALNUTS and PINE NUTS   Peanuts [peanut Oil] Rash   Throat swelling   Sulfa Antibiotics Rash      Medication List          Accurate as of 10/20/17 12:52 PM. Always use your most recent med list.          albuterol 108 (90 Base) MCG/ACT inhaler Commonly known as:  PROVENTIL HFA;VENTOLIN HFA Inhale 1-2 puffs into the lungs every 6 (six) hours as needed for wheezing or shortness of breath.   azithromycin 250 MG tablet Commonly known as:  ZITHROMAX Take as directed on package.   BREO ELLIPTA 100-25 MCG/INH Aepb Generic drug:  fluticasone furoate-vilanterol Inhale 1 puff into the lungs daily.   Fluticasone-Salmeterol 100-50 MCG/DOSE Aepb Commonly known as:  ADVAIR DISKUS Inhale 1 puff into the lungs 2 (two) times daily.   hyoscyamine 0.125 MG tablet Commonly known as:  LEVSIN, ANASPAZ Take 1 tablet (0.125 mg total) by mouth as needed for cramping. Reported on 09/24/2015   levocetirizine 5 MG tablet Commonly known as:  XYZAL Take 5 mg by mouth every evening.   Turmeric 500 MG Caps Commonly known as:  CVS TURMERIC CURCUMIN Take 500 mg by mouth daily.   Vitamin D (Ergocalciferol) 50000 units Caps capsule Commonly known as:  DRISDOL Take 1 capsule (50,000 Units total) by mouth every 7 (seven) days.       Allergies:  Allergies  Allergen Reactions  . Abilify [Aripiprazole] Swelling    Throat begins to swell   . Ciprofloxacin Shortness Of Breath  . Lamictal [Lamotrigine] Rash  . Amoxicillin Hives  . Doxycycline Hives  . Latex Rash  . Meloxicam Other (See Comments)    Induces mania  . Mirapex [Pramipexole] Hives  . Prednisone Other (See Comments)    Interacts with Lithium. " All steroids"  . Risperdal [Risperidone] Hives  . Tape Rash    "steri strips"  . Cortisone     Exacerbates the mania of her bipolar  . Naproxen     Interacts with lithium.   Marland Kitchen Percocet [Oxycodone-Acetaminophen] Other (See Comments)    Severe dizziness  . Other Rash    Throat swelling and rash to WALNUTS and PINE NUTS  . Peanuts [Peanut Oil] Rash    Throat swelling  . Sulfa Antibiotics Rash    Past Medical  History, Surgical history, Social history, and Family History were reviewed and updated.  Review of Systems: Review of Systems  Constitutional: Positive for malaise/fatigue.  HENT: Positive for congestion.   Eyes: Negative.   Respiratory: Positive for shortness of breath.   Cardiovascular: Positive for palpitations.  Gastrointestinal: Negative.   Genitourinary: Negative.   Musculoskeletal: Positive for myalgias.  Skin: Negative.   Neurological: Positive for tingling.  Endo/Heme/Allergies: Negative.   Psychiatric/Behavioral: The patient is nervous/anxious.       Physical Exam:  weight is 157 lb (71.2 kg). Her oral temperature  is 98.5 F (36.9 C). Her blood pressure is 113/68 and her pulse is 77. Her respiration is 18 and oxygen saturation is 100%.   Wt Readings from Last 3 Encounters:  10/20/17 157 lb (71.2 kg)  10/07/17 157 lb (71.2 kg)  09/20/17 155 lb (70.3 kg)    Physical Exam  Constitutional: She is oriented to person, place, and time.  HENT:  Head: Normocephalic and atraumatic.  Mouth/Throat: Oropharynx is clear and moist.  Eyes: Pupils are equal, round, and reactive to light. EOM are normal.  Neck: Normal range of motion.  Cardiovascular: Normal rate, regular rhythm and normal heart sounds.  Pulmonary/Chest: Effort normal and breath sounds normal.  She has bilateral axillary adenopathy.  Lymph nodes in the left axilla might be a little bit larger than the lymph nodes in the right axilla.  Lymph nodes are mobile and slightly firm but nontender.  Abdominal: Soft. Bowel sounds are normal.  Musculoskeletal: Normal range of motion. She exhibits no edema, tenderness or deformity.  Lymphadenopathy:    She has cervical adenopathy.  Neurological: She is alert and oriented to person, place, and time.  Skin: Skin is warm and dry. No rash noted. No erythema.  Psychiatric: She has a normal mood and affect. Her behavior is normal. Judgment and thought content normal.  Vitals  reviewed.    Lab Results  Component Value Date   WBC 26.6 (H) 10/20/2017   HGB 13.8 10/07/2017   HCT 43.9 10/20/2017   MCV 86.8 10/20/2017   PLT 186 10/20/2017   No results found for: FERRITIN, IRON, TIBC, UIBC, IRONPCTSAT Lab Results  Component Value Date   RBC 5.06 10/20/2017   No results found for: Nils Pyle, [facility] Lab Results  Component Value Date   IGGSERUM 611 (L) 09/08/2017   IGA 24 (L) 09/08/2017   IGMSERUM 11 (L) 09/08/2017   Lab Results  Component Value Date   TOTALPROTELP 6.5 09/08/2017   ALBUMINELP 4.3 09/08/2017   A1GS 0.2 09/08/2017   A2GS 0.7 09/08/2017   BETS 0.8 09/08/2017   GAMS 0.5 09/08/2017   MSPIKE Not Observed 09/08/2017     Chemistry      Component Value Date/Time   NA 145 10/20/2017 1216   NA 142 03/10/2017 0954   NA 142 09/04/2016 0754   K 4.7 10/20/2017 1216   K 4.2 03/10/2017 0954   K 4.0 09/04/2016 0754   CL 106 10/20/2017 1216   CL 111 (H) 03/10/2017 0954   CO2 28 10/20/2017 1216   CO2 28 03/10/2017 0954   CO2 25 09/04/2016 0754   BUN 13 10/20/2017 1216   BUN 11 03/10/2017 0954   BUN 13.4 09/04/2016 0754   CREATININE 1.00 10/20/2017 1216   CREATININE 1.1 03/10/2017 0954   CREATININE 0.8 09/04/2016 0754      Component Value Date/Time   CALCIUM 9.6 10/20/2017 1216   CALCIUM 9.6 03/10/2017 0954   CALCIUM 9.7 09/04/2016 0754   ALKPHOS 61 10/20/2017 1216   ALKPHOS 46 03/10/2017 0954   ALKPHOS 54 09/04/2016 0754   AST 40 (H) 10/20/2017 1216   AST 20 09/04/2016 0754   ALT 32 10/20/2017 1216   ALT 20 03/10/2017 0954   ALT 14 09/04/2016 0754   BILITOT 0.8 10/20/2017 1216   BILITOT 0.39 09/04/2016 0754      Impression and Plan: [name] is a very pleasant 52 yo caucasian female basically a lymph node version of CLL.  She has small lymphocytic lymphoma which is a low-grade  non-Hodgkin's lymphoma.  She basically is going to think about what she would like to do.  We have to call her with the report from  the cytogenetics on her peripheral blood.  If she wants to think about a clinical trial, I will refer her over to [facility].  She does have a lot to think about.  Again, I think for frontline therapy for low-grade lymphoma, I would think that Rituxan/bendamustine would be very appropriate and very standard of care.  Response rates should be over 90%.  She will let us know what she would like to do.  I spent over half the time with she and her family face-to-face.  My answered all their questions.   Volanda Napoleon, MD 4/3/201912:52 PM

## 2017-11-04 ENCOUNTER — Other Ambulatory Visit: Payer: Self-pay | Admitting: Hematology & Oncology

## 2017-11-04 DIAGNOSIS — C9110 Chronic lymphocytic leukemia of B-cell type not having achieved remission: Secondary | ICD-10-CM

## 2017-11-04 LAB — FISH,CLL PROGNOSTIC PANEL

## 2017-11-10 ENCOUNTER — Telehealth: Payer: Self-pay | Admitting: Hematology & Oncology

## 2017-11-10 DIAGNOSIS — C8300 Small cell B-cell lymphoma, unspecified site: Secondary | ICD-10-CM | POA: Diagnosis not present

## 2017-11-10 DIAGNOSIS — C9110 Chronic lymphocytic leukemia of B-cell type not having achieved remission: Secondary | ICD-10-CM | POA: Diagnosis not present

## 2017-11-10 NOTE — Telephone Encounter (Signed)
lmom to inform pt of 11/25/17 appt at 1030 am per sch msg

## 2017-11-16 ENCOUNTER — Encounter: Payer: Self-pay | Admitting: Hematology & Oncology

## 2017-11-25 ENCOUNTER — Other Ambulatory Visit: Payer: Self-pay

## 2017-11-25 ENCOUNTER — Ambulatory Visit: Payer: Self-pay | Admitting: Hematology & Oncology

## 2017-11-25 DIAGNOSIS — C9110 Chronic lymphocytic leukemia of B-cell type not having achieved remission: Secondary | ICD-10-CM | POA: Diagnosis not present

## 2017-11-25 DIAGNOSIS — C9190 Lymphoid leukemia, unspecified not having achieved remission: Secondary | ICD-10-CM | POA: Diagnosis not present

## 2017-12-01 ENCOUNTER — Inpatient Hospital Stay: Payer: Medicare Other | Attending: Family

## 2017-12-01 ENCOUNTER — Inpatient Hospital Stay (HOSPITAL_BASED_OUTPATIENT_CLINIC_OR_DEPARTMENT_OTHER): Payer: Medicare Other | Admitting: Hematology & Oncology

## 2017-12-01 ENCOUNTER — Encounter: Payer: Self-pay | Admitting: Hematology & Oncology

## 2017-12-01 ENCOUNTER — Other Ambulatory Visit: Payer: Self-pay

## 2017-12-01 VITALS — BP 114/56 | HR 65 | Temp 98.3°F | Resp 20 | Wt 160.0 lb

## 2017-12-01 DIAGNOSIS — R531 Weakness: Secondary | ICD-10-CM | POA: Diagnosis not present

## 2017-12-01 DIAGNOSIS — R5383 Other fatigue: Secondary | ICD-10-CM | POA: Diagnosis not present

## 2017-12-01 DIAGNOSIS — C9110 Chronic lymphocytic leukemia of B-cell type not having achieved remission: Secondary | ICD-10-CM | POA: Diagnosis not present

## 2017-12-01 LAB — CBC WITH DIFFERENTIAL (CANCER CENTER ONLY)
BASOS PCT: 0 %
Band Neutrophils: 0 %
Basophils Absolute: 0 10*3/uL (ref 0.0–0.1)
Blasts: 0 %
Eosinophils Absolute: 0.5 10*3/uL (ref 0.0–0.5)
Eosinophils Relative: 2 %
HEMATOCRIT: 41.5 % (ref 34.8–46.6)
HEMOGLOBIN: 13.8 g/dL (ref 11.6–15.9)
LYMPHS PCT: 81 %
Lymphs Abs: 20.5 10*3/uL — ABNORMAL HIGH (ref 0.9–3.3)
MCH: 28.1 pg (ref 26.0–34.0)
MCHC: 33.3 g/dL (ref 32.0–36.0)
MCV: 84.5 fL (ref 81.0–101.0)
MONO ABS: 1.5 10*3/uL — AB (ref 0.1–0.9)
Metamyelocytes Relative: 0 %
Monocytes Relative: 6 %
Myelocytes: 0 %
Neutro Abs: 2.8 10*3/uL (ref 1.5–6.5)
Neutrophils Relative %: 11 %
OTHER: 0 %
PROMYELOCYTES RELATIVE: 0 %
Platelet Count: 178 10*3/uL (ref 145–400)
RBC: 4.91 MIL/uL (ref 3.70–5.32)
RDW: 14.3 % (ref 11.1–15.7)
WBC: 25.3 10*3/uL — AB (ref 3.9–10.0)
nRBC: 0 /100 WBC

## 2017-12-01 LAB — CMP (CANCER CENTER ONLY)
ALBUMIN: 4.4 g/dL (ref 3.5–5.0)
ALK PHOS: 54 U/L (ref 40–150)
ALT: 32 U/L (ref 0–55)
ANION GAP: 8 (ref 3–11)
AST: 37 U/L — ABNORMAL HIGH (ref 5–34)
BILIRUBIN TOTAL: 0.4 mg/dL (ref 0.2–1.2)
BUN: 10 mg/dL (ref 7–26)
CALCIUM: 10.3 mg/dL (ref 8.4–10.4)
CO2: 27 mmol/L (ref 22–29)
Chloride: 106 mmol/L (ref 98–109)
Creatinine: 0.99 mg/dL (ref 0.60–1.10)
Glucose, Bld: 80 mg/dL (ref 70–140)
POTASSIUM: 4.4 mmol/L (ref 3.5–5.1)
Sodium: 141 mmol/L (ref 136–145)
TOTAL PROTEIN: 7.1 g/dL (ref 6.4–8.3)

## 2017-12-01 LAB — LACTATE DEHYDROGENASE: LDH: 265 U/L — ABNORMAL HIGH (ref 125–245)

## 2017-12-01 LAB — TECHNOLOGIST SMEAR REVIEW

## 2017-12-01 NOTE — Progress Notes (Signed)
Hematology and Oncology Follow Up Visit  Morgan [name] [id number] 1966-06-14 52 y.o. 12/01/2017   Principle Diagnosis:  Small Lymphocytic B-cell NHL/CLL  -- early stage  Current Therapy:   Observation   Interim History:  Morgan [name] is here today for follow-up.  I am actually surprised that she came to see [place].  She was seen at [facility] a week or so ago.  She had a second opinion there.  From the note that I received from the oncologist at [facility], it was apparent that she was going to change facilities and get follow-up at [facility].  I suppose it is no surprise that she does not want to do that.  She, as always, is incredibly ambivalent about what to do.  She does not feel well.  She feels tired.  She has weakness.  She has occasional hoarseness.  She said that she may have some swallowing difficulties.  The oncologist that she saw said that she would just be observed.  I totally agree with that.  I went over her cytogenetics.  She does not have any abnormal cytogenetics that would imply that she has aggressive disease.  She says that her lymph nodes do not feel as prominent in her neck.  However, she says that when she was walking today, she felt a new lymph node in the right inguinal area.  She really is having a lot of difficulties mentally with this process.  I will have to see if we can refer her to a counselor.  I know a very good counselor that we might be able to get Morgan [name] to see.  She also is interested in a support group.  I am not sure if we have any support groups for her disease.  Again we will look into this.  She talked for about 45 minutes.  Again, I am absolutely shocked that she came back to see [place] as she appeared to be very happy with the doctor at [facility].  Overall, I think that she still has a good performance status.  I think her performance status is ECOG 1.   Medications:  Allergies as of 12/01/2017      Reactions   Abilify [aripiprazole] Swelling   Throat begins to swell    Ciprofloxacin Shortness Of Breath   Lamictal [lamotrigine] Rash   Amoxicillin Hives   Doxycycline Hives   Latex Rash   Meloxicam Other (See Comments)   Induces mania   Mirapex [pramipexole] Hives   Prednisone Other (See Comments)   Interacts with Lithium. " All steroids"   Risperdal [risperidone] Hives   Tape Rash   "steri strips"   Cortisone    Exacerbates the mania of her bipolar   Naproxen    Interacts with lithium.    Percocet [oxycodone-acetaminophen] Other (See Comments)   Severe dizziness   Other Rash   Throat swelling and rash to WALNUTS and PINE NUTS   Peanuts [peanut Oil] Rash   Throat swelling   Sulfa Antibiotics Rash      Medication List        Accurate as of 12/01/17  2:34 PM. Always use your most recent med list.          albuterol 108 (90 Base) MCG/ACT inhaler Commonly known as:  PROVENTIL HFA;VENTOLIN HFA Inhale 1-2 puffs into the lungs every 6 (six) hours as needed for wheezing or shortness of breath.   azithromycin 250 MG tablet Commonly known as:  ZITHROMAX Take as directed on package.  BREO ELLIPTA 100-25 MCG/INH Aepb Generic drug:  fluticasone furoate-vilanterol Inhale 1 puff into the lungs daily.   Fluticasone-Salmeterol 100-50 MCG/DOSE Aepb Commonly known as:  ADVAIR DISKUS Inhale 1 puff into the lungs 2 (two) times daily.   hyoscyamine 0.125 MG tablet Commonly known as:  LEVSIN, ANASPAZ Take 1 tablet (0.125 mg total) by mouth as needed for cramping. Reported on 09/24/2015   levocetirizine 5 MG tablet Commonly known as:  XYZAL Take 5 mg by mouth as needed.   Turmeric 500 MG Caps Commonly known as:  CVS TURMERIC CURCUMIN Take 500 mg by mouth daily.   Vitamin D (Ergocalciferol) 50000 units Caps capsule Commonly known as:  DRISDOL Take 1 capsule (50,000 Units total) by mouth every 7 (seven) days.   Vitamin D3 5000 units Caps Take 5,000 Units by mouth daily.       Allergies:  Allergies  Allergen  Reactions  . Abilify [Aripiprazole] Swelling    Throat begins to swell   . Ciprofloxacin Shortness Of Breath  . Lamictal [Lamotrigine] Rash  . Amoxicillin Hives  . Doxycycline Hives  . Latex Rash  . Meloxicam Other (See Comments)    Induces mania  . Mirapex [Pramipexole] Hives  . Prednisone Other (See Comments)    Interacts with Lithium. " All steroids"  . Risperdal [Risperidone] Hives  . Tape Rash    "steri strips"  . Cortisone     Exacerbates the mania of her bipolar  . Naproxen     Interacts with lithium.   Marland Kitchen Percocet [Oxycodone-Acetaminophen] Other (See Comments)    Severe dizziness  . Other Rash    Throat swelling and rash to WALNUTS and PINE NUTS  . Peanuts [Peanut Oil] Rash    Throat swelling  . Sulfa Antibiotics Rash    Past Medical History, Surgical history, Social history, and Family History were reviewed and updated.  Review of Systems: Review of Systems  Constitutional: Positive for malaise/fatigue.  HENT: Positive for congestion.   Eyes: Negative.   Respiratory: Positive for shortness of breath.   Cardiovascular: Positive for palpitations.  Gastrointestinal: Negative.   Genitourinary: Negative.   Musculoskeletal: Positive for myalgias.  Skin: Negative.   Neurological: Positive for tingling.  Endo/Heme/Allergies: Negative.   Psychiatric/Behavioral: The patient is nervous/anxious.       Physical Exam:  weight is 160 lb (72.6 kg). Her oral temperature is 98.3 F (36.8 C). Her blood pressure is 114/56 (abnormal) and her pulse is 65. Her respiration is 20 and oxygen saturation is 100%.   Wt Readings from Last 3 Encounters:  12/01/17 160 lb (72.6 kg)  10/20/17 157 lb (71.2 kg)  10/07/17 157 lb (71.2 kg)    Physical Exam  Constitutional: She is oriented to person, place, and time.  HENT:  Head: Normocephalic and atraumatic.  Mouth/Throat: Oropharynx is clear and moist.  Eyes: Pupils are equal, round, and reactive to light. EOM are normal.  Neck:  Normal range of motion.  Cardiovascular: Normal rate, regular rhythm and normal heart sounds.  Pulmonary/Chest: Effort normal and breath sounds normal.  She has bilateral axillary adenopathy.  Lymph nodes in the left axilla might be a little bit larger than the lymph nodes in the right axilla.  Lymph nodes are mobile and slightly firm but nontender.  Abdominal: Soft. Bowel sounds are normal.  Musculoskeletal: Normal range of motion. She exhibits no edema, tenderness or deformity.  Lymphadenopathy:    She has cervical adenopathy.  Neurological: She is alert and oriented to person, place,  and time.  Skin: Skin is warm and dry. No rash noted. No erythema.  Psychiatric: She has a normal mood and affect. Her behavior is normal. Judgment and thought content normal.  Vitals reviewed.    Lab Results  Component Value Date   WBC 25.3 (H) 12/01/2017   HGB 13.8 12/01/2017   HCT 41.5 12/01/2017   MCV 84.5 12/01/2017   PLT 178 12/01/2017   No results found for: FERRITIN, IRON, TIBC, UIBC, IRONPCTSAT Lab Results  Component Value Date   RBC 4.91 12/01/2017   No results found for: Nils Pyle, [facility] Lab Results  Component Value Date   IGGSERUM 611 (L) 09/08/2017   IGA 24 (L) 09/08/2017   IGMSERUM 11 (L) 09/08/2017   Lab Results  Component Value Date   TOTALPROTELP 6.5 09/08/2017   ALBUMINELP 4.3 09/08/2017   A1GS 0.2 09/08/2017   A2GS 0.7 09/08/2017   BETS 0.8 09/08/2017   GAMS 0.5 09/08/2017   MSPIKE Not Observed 09/08/2017     Chemistry      Component Value Date/Time   NA 145 10/20/2017 1216   NA 142 03/10/2017 0954   NA 142 09/04/2016 0754   K 4.7 10/20/2017 1216   K 4.2 03/10/2017 0954   K 4.0 09/04/2016 0754   CL 106 10/20/2017 1216   CL 111 (H) 03/10/2017 0954   CO2 28 10/20/2017 1216   CO2 28 03/10/2017 0954   CO2 25 09/04/2016 0754   BUN 13 10/20/2017 1216   BUN 11 03/10/2017 0954   BUN 13.4 09/04/2016 0754   CREATININE 1.00 10/20/2017 1216    CREATININE 1.1 03/10/2017 0954   CREATININE 0.8 09/04/2016 0754      Component Value Date/Time   CALCIUM 9.6 10/20/2017 1216   CALCIUM 9.6 03/10/2017 0954   CALCIUM 9.7 09/04/2016 0754   ALKPHOS 61 10/20/2017 1216   ALKPHOS 46 03/10/2017 0954   ALKPHOS 54 09/04/2016 0754   AST 40 (H) 10/20/2017 1216   AST 20 09/04/2016 0754   ALT 32 10/20/2017 1216   ALT 20 03/10/2017 0954   ALT 14 09/04/2016 0754   BILITOT 0.8 10/20/2017 1216   BILITOT 0.39 09/04/2016 0754      Impression and Plan: Morgan [name] is a very pleasant 52 yo caucasian female basically a lymph node version of CLL.  She has small lymphocytic lymphoma which is a low-grade non-Hodgkin's lymphoma.  She basically is going to think about what she would like to do.    We will have to see about getting her to see a counselor.  She just does not know what to do about her disease.  Again, she is asymptomatic from my point of view.  I do not think that her symptoms are from the CLL/SLL.  We probably will get her back in another 3 or 4 months.  She knows that she can always come back sooner if she has any issues.  I spent about 45 minutes with her today.  All the time was spent face-to-face with her.    Volanda Napoleon, MD 5/15/20192:34 PM

## 2017-12-02 LAB — BETA 2 MICROGLOBULIN, SERUM: Beta-2 Microglobulin: 2.3 mg/L (ref 0.6–2.4)

## 2017-12-03 ENCOUNTER — Ambulatory Visit (INDEPENDENT_AMBULATORY_CARE_PROVIDER_SITE_OTHER): Payer: Medicare Other | Admitting: Family Medicine

## 2017-12-03 ENCOUNTER — Encounter: Payer: Self-pay | Admitting: Family Medicine

## 2017-12-03 VITALS — BP 124/70 | HR 67 | Temp 98.1°F | Ht 63.5 in | Wt 161.1 lb

## 2017-12-03 DIAGNOSIS — J0100 Acute maxillary sinusitis, unspecified: Secondary | ICD-10-CM | POA: Diagnosis not present

## 2017-12-03 DIAGNOSIS — H6991 Unspecified Eustachian tube disorder, right ear: Secondary | ICD-10-CM | POA: Insufficient documentation

## 2017-12-03 DIAGNOSIS — H6981 Other specified disorders of Eustachian tube, right ear: Secondary | ICD-10-CM

## 2017-12-03 MED ORDER — PSEUDOEPHEDRINE-GUAIFENESIN ER 60-600 MG PO TB12: 1.0000 | ORAL_TABLET | Freq: Two times a day (BID) | ORAL | 0 refills | Status: DC

## 2017-12-03 MED ORDER — CLARITHROMYCIN ER 500 MG PO TB24: 1000.0000 mg | ORAL_TABLET | Freq: Every day | ORAL | 0 refills | Status: DC

## 2017-12-03 NOTE — Patient Instructions (Signed)
Clarithromycin extended-release tablets What is this medicine? CLARITHROMYCIN (kla RITH roe mye sin) is a macrolide antibiotic. It is used to treat or prevent certain kinds of bacterial infections. It will not work for colds, flu, or other viral infections. This medicine may be used for other purposes; ask your health care provider or pharmacist if you have questions. COMMON BRAND NAME(S): Biaxin XL What should I tell my health care provider before I take this medicine? They need to know if you have any of these conditions: -bowel disease, like colitis -irregular heartbeat or heart disease -kidney disease -liver disease -an unusual or allergic reaction to clarithromycin, other macrolide antibiotics, other medicines, foods, dyes, or preservatives -pregnant or trying to get pregnant -breast-feeding How should I use this medicine? Take this medicine by mouth with food. Swallow the tablets whole, do not break, chew, or crush. Follow the directions on the prescription label. Take your medicine at regular intervals. Do not take your medicine more often than directed. Take all of your medicine as directed even if you think your are better. Do not skip doses or stop your medicine early. Talk to your pediatrician regarding the use of this medicine in children. Special care may be needed. Overdosage: If you think you have taken too much of this medicine contact a poison control center or emergency room at once. NOTE: This medicine is only for you. Do not share this medicine with others. What if I miss a dose? If you miss a dose, take it as soon as you can. If it is almost time for your next dose, take only that dose. Do not take double or extra doses. What may interact with this medicine? Do not take this medicine with any of the following medications: -certain medicines for fungal infections like fluconazole, itraconazole, ketoconazole, posaconazole,  voriconazole -cisapride -dofetilide -dronedarone -pimozide -thioridazine -ziprasidone This medicine may also interact with the following medications: -birth control pills -carbamazepine -certain medicines for anxiety or sleep like alprazolam or triazolam -certain medicines for cholesterol like atorvastatin, lovastatin, simvastatin -certain medicines for irregular heart beat like amiodarone, disopyramide, flecainide, procainamide, quinidine -certain medicines that treat or prevent clots like warfarin -colchicine -cyclosporine -digoxin -ergot alkaloids like ergotamine, dihydroergotamine -other antibiotics like grepafloxacin, rifabutin, sparfloxacin -other medicines that prolong the QT interval (cause an abnormal heart rhythm) -ritonavir -sildenafil -terfenadine -theophylline -zidovudine This list may not describe all possible interactions. Give your health care provider a list of all the medicines, herbs, non-prescription drugs, or dietary supplements you use. Also tell them if you smoke, drink alcohol, or use illegal drugs. Some items may interact with your medicine. What should I watch for while using this medicine? Tell your doctor or health care professional if your symptoms do not improve. Do not treat diarrhea with over the counter products. Contact your doctor if you have diarrhea that lasts more than 2 days or if it is severe and watery. If you have diabetes, monitor your blood sugar carefully while on this medicine. What side effects may I notice from receiving this medicine? Side effects that you should report to your doctor or health care professional as soon as possible: -allergic reactions like skin rash, itching or hives, swelling of the face, lips, or tongue -irregular heartbeat or chest pain -pain or difficulty passing urine -redness, blistering, peeling or loosening of the skin, including inside the mouth -yellowing of the eyes or skin Side effects that usually do  not require medical attention (report to your doctor or health care professional if  they continue or are bothersome): -abnormal taste -anxiety, confusion, or nightmares -diarrhea -headache -intestinal gas -stomach upset or nausea This list may not describe all possible side effects. Call your doctor for medical advice about side effects. You may report side effects to FDA at 1-800-FDA-1088. Where should I keep my medicine? Keep out of the reach of children. Store at room temperature between 15 and 30 degrees C (59 and 86 degrees F). Keep container tightly closed. Protect from light. Throw away any unused medicine after the expiration date. NOTE: This sheet is a summary. It may not cover all possible information. If you have questions about this medicine, talk to your doctor, pharmacist, or health care provider.  2018 Elsevier/Gold Standard (2015-08-08 10:30:18)

## 2017-12-03 NOTE — Progress Notes (Signed)
Subjective:  Patient ID: Morgan [name], female    DOB: [birth year]  Age: 52 y.o. MRN: [id number]  CC: Sinusitis   HPI Morgan [name] presents for evaluation and treatment of a 1 day history of facial pressure over her cheekbones with upper teeth pain and copious yellow-green rhinorrhea.  She is having some cough productive of the same.  She denies wheezing or reactive airway disease at this time.  She is also having pain on the top of her head.  All of this comes on the heels of a month long history of her usual allergy symptoms to include postnasal drip rhinorrhea cough and sneezing.  This is been treated with Xyzal.  She is not currently using a nasal steroid.  She has had no fever or chills.  Significant past medical history of active CLL.  She is allergic to multiple antibiotics.  Amoxicillin and Doxy have both caused hives.  Past medical history of bipolar disease.  She tells me this is been quiescent  and she is currently on psychotropic medicines.  Outpatient Medications Prior to Visit  Medication Sig Dispense Refill  . Cholecalciferol (VITAMIN D3) 5000 units CAPS Take 5,000 Units by mouth daily.    . fluticasone furoate-vilanterol (BREO ELLIPTA) 100-25 MCG/INH AEPB Inhale 1 puff into the lungs daily.    . hyoscyamine (LEVSIN, ANASPAZ) 0.125 MG tablet Take 1 tablet (0.125 mg total) by mouth as needed for cramping. Reported on 09/24/2015 10 tablet 2  . levocetirizine (XYZAL) 5 MG tablet Take 5 mg by mouth as needed.     . Turmeric ([employer] TURMERIC CURCUMIN) 500 MG CAPS Take 500 mg by mouth daily.    Marland Kitchen albuterol (PROVENTIL HFA;VENTOLIN HFA) 108 (90 Base) MCG/ACT inhaler Inhale 1-2 puffs into the lungs every 6 (six) hours as needed for wheezing or shortness of breath. (Patient not taking: Reported on 12/01/2017) 1 each 3  . Fluticasone-Salmeterol (ADVAIR DISKUS) 100-50 MCG/DOSE AEPB Inhale 1 puff into the lungs 2 (two) times daily. (Patient not taking: Reported on 12/01/2017) 28 each 0  . Vitamin D,  Ergocalciferol, (DRISDOL) 50000 units CAPS capsule Take 1 capsule (50,000 Units total) by mouth every 7 (seven) days. (Patient not taking: Reported on 12/01/2017) 12 capsule 0  . azithromycin (ZITHROMAX) 250 MG tablet Take as directed on package. 6 tablet 0   No facility-administered medications prior to visit.     ROS Review of Systems  Constitutional: Negative for chills, fever and unexpected weight change.  HENT: Positive for congestion, ear pain, postnasal drip, rhinorrhea, sinus pressure and sinus pain. Negative for trouble swallowing.   Eyes: Negative for photophobia and visual disturbance.  Respiratory: Positive for cough. Negative for chest tightness, shortness of breath and wheezing.   Cardiovascular: Negative.   Gastrointestinal: Negative.   Skin: Negative for pallor and rash.  Allergic/Immunologic: Positive for immunocompromised state.  Neurological: Positive for light-headedness and headaches.  Hematological: Does not bruise/bleed easily.  Psychiatric/Behavioral: Negative.     Objective:  BP 124/70   Pulse 67   Temp 98.1 F (36.7 C)   Ht 5' 3.5" (1.613 m)   Wt 161 lb 2 oz (73.1 kg)   SpO2 98%   BMI 28.09 kg/m   BP Readings from Last 3 Encounters:  12/03/17 124/70  12/01/17 (!) 114/56  10/20/17 113/68    Wt Readings from Last 3 Encounters:  12/03/17 161 lb 2 oz (73.1 kg)  12/01/17 160 lb (72.6 kg)  10/20/17 157 lb (71.2 kg)    Physical Exam  Constitutional: She is oriented to person, place, and time. She appears well-developed and well-nourished. No distress.  HENT:  Head: Normocephalic and atraumatic.  Right Ear: External ear and ear canal normal. Tympanic membrane is retracted. Tympanic membrane is not erythematous. No middle ear effusion.  Left Ear: Hearing, tympanic membrane, external ear and ear canal normal.  Nose: Nose normal.  Mouth/Throat: Oropharynx is clear and moist. No oropharyngeal exudate.  Eyes: Pupils are equal, round, and reactive to  light. Conjunctivae and EOM are normal. Right eye exhibits no discharge. Left eye exhibits no discharge. No scleral icterus.  Neck: Normal range of motion. Neck supple. No JVD present. No tracheal deviation present. No thyromegaly present.  Cardiovascular: Normal rate, regular rhythm and normal heart sounds.  Pulmonary/Chest: Effort normal and breath sounds normal. No stridor. No respiratory distress. She has no wheezes. She has no rales.  Abdominal: Bowel sounds are normal.  Neurological: She is alert and oriented to person, place, and time.  Skin: Skin is warm and dry. She is not diaphoretic.  Psychiatric: She has a normal mood and affect. Her behavior is normal.    Lab Results  Component Value Date   WBC 25.3 (H) 12/01/2017   HGB 13.8 12/01/2017   HCT 41.5 12/01/2017   PLT 178 12/01/2017   GLUCOSE 80 12/01/2017   ALT 32 12/01/2017   AST 37 (H) 12/01/2017   NA 141 12/01/2017   K 4.4 12/01/2017   CL 106 12/01/2017   CREATININE 0.99 12/01/2017   BUN 10 12/01/2017   CO2 27 12/01/2017   TSH 4.98 (H) 04/17/2016   INR 0.88 10/07/2017   HGBA1C 5.8 11/11/2016    [place] Core Biopsy (lymph Nodes)  Result Date: 10/07/2017 INDICATION: 52 year old female with a history of chronic lymphocytic leukemia. She has a palpable and painful lymph node in the left posterior cervical chain. She presents for ultrasound-guided core biopsy to evaluate for transformation to a more aggressive lymphoma. EXAM: ULTRASOUND GUIDED NEEDLE ASPIRATE/CORE/NEEDLE ASPIRATE AND CORE BIOPSY OF BODY PART MEDICATIONS: None. ANESTHESIA/SEDATION: Fentanyl 100 mcg IV; Versed 2 mg IV Moderate Sedation Time:  20 minutes The patient was continuously monitored during the procedure by the interventional radiology nurse under my direct supervision. PROCEDURE: The procedure, risks, benefits, and alternatives were explained to the patient. Questions regarding the procedure were encouraged and answered. The patient understands and consents  to the procedure. The operative field was prepped with chlorhexidine in a sterile fashion, and a sterile drape was applied covering the operative field. A sterile gown and sterile gloves were used for the procedure. Local anesthesia was provided with 1% Lidocaine. A small dermatotomy was made. Under real-time ultrasound guidance, multiple 18 gauge core biopsies were obtained of the left posterior chain cervical lymph node. Biopsy specimens were placed in saline and delivered to pathology for further analysis. Post biopsy ultrasound imaging demonstrates no immediate complication. COMPLICATIONS: None immediate. FINDINGS: Ultrasound-guided core biopsy left posterior chain cervical lymph node. IMPRESSION: Technically successful ultrasound-guided core biopsy of the symptomatic left posterior chain cervical lymph node. Electronically Signed   By: Jacqulynn Cadet M.D.   On: 10/07/2017 15:00    Assessment & Plan:   Morgan [name] was seen today for sinusitis.  Diagnoses and all orders for this visit:  Acute non-recurrent maxillary sinusitis -     clarithromycin (BIAXIN XL) 500 MG 24 hr tablet; Take 2 tablets (1,000 mg total) by mouth daily for 10 days. -     pseudoephedrine-guaifenesin (MUCINEX D) 60-600 MG 12 hr tablet;  Take 1 tablet by mouth every 12 (twelve) hours. As needed for stuffy nose and ears.  Dysfunction of right eustachian tube -     pseudoephedrine-guaifenesin (MUCINEX D) 60-600 MG 12 hr tablet; Take 1 tablet by mouth every 12 (twelve) hours. As needed for stuffy nose and ears.   I have discontinued Morgan [name]'s azithromycin. I am also having her start on clarithromycin and pseudoephedrine-guaifenesin. Additionally, I am having her maintain her fluticasone furoate-vilanterol, albuterol, Fluticasone-Salmeterol, Vitamin D (Ergocalciferol), hyoscyamine, Turmeric, levocetirizine, and Vitamin D3.  Meds ordered this encounter  Medications  . clarithromycin (BIAXIN XL) 500 MG 24 hr tablet    Sig:  Take 2 tablets (1,000 mg total) by mouth daily for 10 days.    Dispense:  20 tablet    Refill:  0  . pseudoephedrine-guaifenesin (MUCINEX D) 60-600 MG 12 hr tablet    Sig: Take 1 tablet by mouth every 12 (twelve) hours. As needed for stuffy nose and ears.    Dispense:  20 tablet    Refill:  0   Patient will start Biaxin and I warned her about the metallic taste side effect.  She is aware that we have limited options with regards to antibiotics for her treatment.  She will hold her zyzal and will use Mucinex D instead for treatment of her congested right ear and nose.  She will use nasal saline washes.  Suggested that she try Flonase or Nasonex or future allergy rhinitis issues.  She will do so after clearing this recommendation with oncology.  She has taken oral steroids in the past but they have triggered rapid cycling for her.  Follow-up: Return in about 1 week (around 12/10/2017), or if symptoms worsen or fail to improve.  Libby Maw, MD

## 2017-12-09 ENCOUNTER — Emergency Department (HOSPITAL_COMMUNITY): Payer: Medicare Other

## 2017-12-09 ENCOUNTER — Other Ambulatory Visit: Payer: Self-pay

## 2017-12-09 ENCOUNTER — Ambulatory Visit: Payer: Self-pay

## 2017-12-09 ENCOUNTER — Emergency Department (HOSPITAL_COMMUNITY)
Admission: EM | Admit: 2017-12-09 | Discharge: 2017-12-09 | Disposition: A | Discharge status: Home / Self Care | Payer: Medicare Other | Attending: Emergency Medicine | Admitting: Emergency Medicine

## 2017-12-09 ENCOUNTER — Encounter (HOSPITAL_COMMUNITY): Payer: Self-pay

## 2017-12-09 DIAGNOSIS — E039 Hypothyroidism, unspecified: Secondary | ICD-10-CM | POA: Insufficient documentation

## 2017-12-09 DIAGNOSIS — G8929 Other chronic pain: Secondary | ICD-10-CM

## 2017-12-09 DIAGNOSIS — Z79899 Other long term (current) drug therapy: Secondary | ICD-10-CM | POA: Diagnosis not present

## 2017-12-09 DIAGNOSIS — Z7982 Long term (current) use of aspirin: Secondary | ICD-10-CM | POA: Insufficient documentation

## 2017-12-09 DIAGNOSIS — Z87891 Personal history of nicotine dependence: Secondary | ICD-10-CM | POA: Insufficient documentation

## 2017-12-09 DIAGNOSIS — J45909 Unspecified asthma, uncomplicated: Secondary | ICD-10-CM | POA: Diagnosis not present

## 2017-12-09 DIAGNOSIS — Z91040 Latex allergy status: Secondary | ICD-10-CM | POA: Insufficient documentation

## 2017-12-09 DIAGNOSIS — R51 Headache: Secondary | ICD-10-CM | POA: Insufficient documentation

## 2017-12-09 DIAGNOSIS — R519 Headache, unspecified: Secondary | ICD-10-CM

## 2017-12-09 LAB — CBC WITH DIFFERENTIAL/PLATELET
BASOS ABS: 0.2 10*3/uL — AB (ref 0.0–0.1)
Basophils Relative: 1 %
EOS ABS: 0.2 10*3/uL (ref 0.0–0.7)
Eosinophils Relative: 1 %
HCT: 47.8 % — ABNORMAL HIGH (ref 36.0–46.0)
Hemoglobin: 15 g/dL (ref 12.0–15.0)
LYMPHS ABS: 12.3 10*3/uL — AB (ref 0.7–4.0)
LYMPHS PCT: 65 %
MCH: 26.5 pg (ref 26.0–34.0)
MCHC: 31.4 g/dL (ref 30.0–36.0)
MCV: 84.3 fL (ref 78.0–100.0)
Monocytes Absolute: 1.7 10*3/uL — ABNORMAL HIGH (ref 0.1–1.0)
Monocytes Relative: 9 %
NEUTROS ABS: 4.6 10*3/uL (ref 1.7–7.7)
Neutrophils Relative %: 24 %
Platelets: 170 10*3/uL (ref 150–400)
RBC: 5.67 MIL/uL — AB (ref 3.87–5.11)
RDW: 13.9 % (ref 11.5–15.5)
WBC: 19 10*3/uL — AB (ref 4.0–10.5)

## 2017-12-09 LAB — BASIC METABOLIC PANEL
ANION GAP: 11 (ref 5–15)
BUN: 11 mg/dL (ref 6–20)
CALCIUM: 10.4 mg/dL — AB (ref 8.9–10.3)
CO2: 26 mmol/L (ref 22–32)
CREATININE: 0.85 mg/dL (ref 0.44–1.00)
Chloride: 105 mmol/L (ref 101–111)
GFR calc non Af Amer: >60 mL/min (ref >60)
Glucose, Bld: 102 mg/dL — ABNORMAL HIGH (ref 65–99)
Potassium: 4.3 mmol/L (ref 3.5–5.1)
Sodium: 142 mmol/L (ref 135–145)

## 2017-12-09 MED ORDER — ACETAMINOPHEN 325 MG PO TABS
650.0000 mg | ORAL_TABLET | Freq: Once | ORAL | Status: AC
  Administered 2017-12-09: 650 mg via ORAL
  Filled 2017-12-09: qty 2

## 2017-12-09 MED ORDER — METOCLOPRAMIDE HCL 5 MG/ML IJ SOLN
10.0000 mg | Freq: Once | INTRAMUSCULAR | Status: AC
  Administered 2017-12-09: 10 mg via INTRAVENOUS
  Filled 2017-12-09: qty 2

## 2017-12-09 MED ORDER — DIPHENHYDRAMINE HCL 50 MG/ML IJ SOLN
25.0000 mg | Freq: Once | INTRAMUSCULAR | Status: AC
Start: 2017-12-09 — End: 2017-12-09
  Administered 2017-12-09: 25 mg via INTRAVENOUS
  Filled 2017-12-09: qty 1

## 2017-12-09 MED ORDER — SODIUM CHLORIDE 0.9 % IV BOLUS
1000.0000 mL | Freq: Once | INTRAVENOUS | Status: AC
  Administered 2017-12-09: 1000 mL via INTRAVENOUS

## 2017-12-09 MED ORDER — ONDANSETRON HCL 4 MG/2ML IJ SOLN: 4.0000 mg | Freq: Once | INTRAMUSCULAR | Status: DC

## 2017-12-09 NOTE — Discharge Instructions (Addendum)
Please follow-up with your primary care provider for further evaluation and to begin any preventative medications for your headaches.

## 2017-12-09 NOTE — Telephone Encounter (Signed)
Phone call to pt. To f/u on her recommendation to go to the ER.  Stated she is on her way to the ER at [facility] at present.

## 2017-12-09 NOTE — ED Notes (Signed)
Patient transported to CT 

## 2017-12-09 NOTE — ED Triage Notes (Signed)
PT reports headache that started this morning when she woke up. Pt reports pain is at temples and behind eyes. She endorses vomiting and difficulty focusing. Denies photosensitivity or visual changes. No neuro symptoms

## 2017-12-09 NOTE — ED Notes (Signed)
Patient verbalizes understanding of discharge instructions. Opportunity for questioning and answers were provided. Armband removed by staff, pt discharged from ED ambulatory.   

## 2017-12-09 NOTE — Telephone Encounter (Signed)
Returned call to pt.  Reported she has a severe headache that started last night about bedtime.  Reported it is involving the bilateral temple area and forehead.  Stated it is very painful to lay down.  Described the pain like a "pulling" sensation, when she lays down.  Rated pain at 20/10 when laying, and 10/10 when not laying down.  Stated has history of Migraine headaches, but that this is different; stated this is the  worst headache she has ever had.   Reported she vomited x 1 today.  Stated she can't really focus on things.  Stated she does not have any loss of vision or blurred vision at present.  Denied dizziness, but reported she has intermittent feeling that she could faint, when bending over.  Denied weakness or numbness of extremities at this time; stated she does have intermittent numbness of feet and hands, and has reported this to the doctor.  Denied any speech problems at present.  Reported she has clear nasal drainage; has cough with occasional production of mucus. Denied shortness of breath or chest pain.   Stated no fever, but occasional chills.  Reported she never started her Biaxin that was prescribed for Sinusitis on 5/17; stated "I don't like to take medication if I don't need to."  Denied any facial pressure, as was reported last Friday in office.    Due to severity of symptoms, advised to go to the ER.  Advised to have someone else drive her.  Pt. Verb. Understanding.    Reason for Disposition . [1] SEVERE headache (e.g., excruciating) AND [2] "worst headache" of life  Answer Assessment - Initial Assessment Questions 1. LOCATION: "Where does it hurt?"      Temple and forehead 2. ONSET: "When did the headache start?" (Minutes, hours or days)      Last night at bedtime 3. PATTERN: "Does the pain come and go, or has it been constant since it started?"     Comes and goes;  intensifies with laying down  4. SEVERITY: "How bad is the pain?" and "What does it keep you from doing?"   (e.g., Scale 1-10; mild, moderate, or severe)   - MILD (1-3): doesn't interfere with normal activities    - MODERATE (4-7): interferes with normal activities or awakens from sleep    - SEVERE (8-10): excruciating pain, unable to do any normal activities        Severe if lays down; rated at "20/10" when laying ; currently is at a 10/10 5. RECURRENT SYMPTOM: "Have you ever had headaches before?" If so, ask: "When was the last time?" and "What happened that time?"      Yes; used to have migraines; this is not like a migraine 6. CAUSE: "What do you think is causing the headache?"     Unknown 7. MIGRAINE: "Have you been diagnosed with migraine headaches?" If so, ask: "Is this headache similar?"     "This is not like a migraine" ; haven't had a migraine in awhile 8. HEAD INJURY: "Has there been any recent injury to the head?"      No 9. OTHER SYMPTOMS: "Do you have any other symptoms?" (fever, stiff neck, eye pain, sore throat, cold symptoms)     Vomiting x 1 today; c/o difficulty focusing; denies dizziness; reported episodes of feeling faint when bending over.  10. PREGNANCY: "Is there any chance you are pregnant?" "When was your last menstrual period?"       *No Answer*  Protocols  used: HEADACHE-A-AH  Message from Margot Ables sent at 12/09/2017 10:20 AM EDT   Summary: vomiting/headache   Pt vomiting and has a bad headache. She was in to see Dr. Ethelene Hal at [facility] 12/03/17 and prescribed abx and mucinex D. Pt states that she felt better and did not take the medication. She is wondering if she should take it or if she has pneumonia. Offered appts but pt requesting call back from the nurse.

## 2017-12-09 NOTE — Telephone Encounter (Signed)
This encounter was created in error - please disregard.

## 2017-12-09 NOTE — ED Provider Notes (Signed)
[place] [facility] EMERGENCY DEPARTMENT Provider Note   CSN: [id number] Arrival date & time: 12/09/17  1157     History   Chief Complaint No chief complaint on file.   HPI Morgan [name] is a 52 y.o. female with a past medical history of chronic headaches, depression, bipolar disorder, hypothyroidism, CLL, who presents to ED for evaluation of gradual onset headache since this last night.  Patient is a poor historian because she states "having trouble collecting my thoughts."  Denies blurry vision but states difficult to focus."  Headache gradually began last night before she went to bed.  She did go to sleep thinking that it would improve.  However it worsened this morning and now is more severe than any of her prior headaches.  Is associated with one episode of nonbloody, nonbilious emesis.  Pain is rated at 10/10 and located in her temples,which is where her prior headaches have been.  She has not tried any medications prior to arrival to help with symptoms.  She did have a similar although less severe episode last week and was diagnosed with sinusitis by her PCP.  She states that the symptoms improved without taking any of the medications prescribed to her.  She does have a history of migraines.  Patient states that "I do not like taking any medicine, unless I have to."  She denies any head injuries or falls, vision changes, fever, neck pain, neck stiffness, history of CVA or aneurysms, numbness in arms or legs, thunderclap quality.  HPI  Past Medical History:  Diagnosis Date  . Anxiety   . Arthritis   . Asthma   . Bipolar disorder ([place])   . Bulging lumbar disc 07/19/05  . Chronic headaches   . Chronic lymphocytic leukemia ([place]) 07/31/2016  . Colon polyps   . Degenerative disorder of bone   . Depression   . History of borderline personality disorder   . Hypothyroidism 2007   Developed after use of Lithium  . IBS (irritable bowel syndrome) 1995  . Pneumonia   .  Proctitis   . Substance abuse ([place])    ativan last month    Patient Active Problem List   Diagnosis Date Noted  . Acute non-recurrent maxillary sinusitis 12/03/2017  . Dysfunction of right eustachian tube 12/03/2017  . Piriformis syndrome of right side 04/22/2017  . Lateral epicondylitis of left elbow 04/06/2017  . Neck pain 04/06/2017  . Sacral back pain 04/06/2017  . Solitary pulmonary nodule 10/29/2016  . Chronic lymphocytic leukemia ([place]) 07/31/2016  . Mass in neck 04/17/2016  . Abdominal mass 04/17/2016  . Asthma 12/12/2015  . Multiple environmental allergies 12/12/2015  . Chronic lower back pain 09/12/2015  . Bipolar 1 disorder, depressed ([place]) 08/27/2015    Class: Chronic  . Other specified hypothyroidism 08/23/2015  . Borderline personality disorder ([place]) 08/15/2015  . Severe benzodiazepine use disorder ([place]) 08/15/2015  . Alcohol use disorder, moderate, dependence ([place]) 08/15/2015  . PTSD (post-traumatic stress disorder) 08/15/2015  . History of bipolar disorder 08/15/2015  . Pre-syncope 05/17/2015  . Skull deformity 05/17/2015  . Patellofemoral syndrome of both knees 05/02/2015    Past Surgical History:  Procedure Laterality Date  . ABDOMINAL HYSTERECTOMY    . APPENDECTOMY    . BUNIONECTOMY Right 06/2012  . CHOLECYSTECTOMY    . OOPHORECTOMY    . SHOULDER OPEN ROTATOR CUFF REPAIR Right 03/2010  . TUBAL LIGATION       OB History    Gravida  2  Para  2   Term  2   Preterm  0   AB  0   Living  2     SAB  0   TAB  0   Ectopic  0   Multiple  0   Live Births               Home Medications    Prior to Admission medications   Medication Sig Start Date End Date Taking? Authorizing Provider  acetaminophen (TYLENOL) 500 MG tablet Take 500-1,000 mg by mouth every 6 (six) hours as needed (for headaches or migraines).   Yes [provider]  albuterol (PROVENTIL HFA;VENTOLIN HFA) 108 (90 Base) MCG/ACT inhaler Inhale 1-2 puffs into the  lungs every 6 (six) hours as needed for wheezing or shortness of breath. 06/22/16  Yes Copland, Gay Filler, MD  aspirin-acetaminophen-caffeine (EXCEDRIN MIGRAINE) [phone] MG tablet Take 1-2 tablets by mouth every 6 (six) hours as needed for headache or migraine.    Yes [provider]  Cholecalciferol (VITAMIN D3) 5000 units CAPS Take 5,000 Units by mouth daily.   Yes [provider]  fluticasone furoate-vilanterol (BREO ELLIPTA) 100-25 MCG/INH AEPB Inhale 1 puff into the lungs daily as needed ("for flares").    Yes [provider]  hyoscyamine (LEVSIN, ANASPAZ) 0.125 MG tablet Take 1 tablet (0.125 mg total) by mouth as needed for cramping. Reported on 09/24/2015 07/05/17  Yes Copland, Gay Filler, MD  clarithromycin (BIAXIN XL) 500 MG 24 hr tablet Take 2 tablets (1,000 mg total) by mouth daily for 10 days. 12/03/17 12/13/17  Libby Maw, MD  Fluticasone-Salmeterol (ADVAIR DISKUS) 100-50 MCG/DOSE AEPB Inhale 1 puff into the lungs 2 (two) times daily. Patient not taking: Reported on 12/09/2017 10/29/16   Rigoberto Noel, MD  levocetirizine (XYZAL) 5 MG tablet Take 5 mg by mouth as needed.     [provider]  pseudoephedrine-guaifenesin (MUCINEX D) 60-600 MG 12 hr tablet Take 1 tablet by mouth every 12 (twelve) hours. As needed for stuffy nose and ears. Patient taking differently: Take 1 tablet by mouth every 12 (twelve) hours as needed (for stuffy nose, ear pressure, and congestion).  12/03/17   Libby Maw, MD  Turmeric ([employer] TURMERIC CURCUMIN) 500 MG CAPS Take 500 mg by mouth daily. 09/20/17   Volanda Napoleon, MD  Vitamin D, Ergocalciferol, (DRISDOL) 50000 units CAPS capsule Take 1 capsule (50,000 Units total) by mouth every 7 (seven) days. Patient not taking: Reported on 12/09/2017 04/22/17   [name], DO    Family History Family History  Problem Relation Age of Onset  . Depression Mother   . Hypertension Mother   . Post-traumatic stress  disorder Sister   . Alcohol abuse Brother   . Drug abuse Brother   . Post-traumatic stress disorder Brother   . Thyroid disease Father   . Alzheimer's disease Father   . COPD Maternal Grandmother   . Heart disease Maternal Grandfather   . Arthritis Paternal Grandmother   . Diabetes Paternal Grandmother   . Depression Paternal Grandmother   . Alzheimer's disease Paternal Grandmother   . Heart disease Paternal Grandfather   . Coronary artery disease Paternal Grandfather   . Alcohol abuse Paternal Grandfather   . Colon cancer Neg Hx   . Breast cancer Neg Hx     Social History Social History   Tobacco Use  . Smoking status: Former Smoker    Last attempt to quit: 07/20/1980  Years since quitting: 37.4  . Smokeless tobacco: Never Used  Substance Use Topics  . Alcohol use: No    Alcohol/week: 0.0 oz    Comment: unsure  . Drug use: Yes    Types: Benzodiazepines     Allergies   Abilify [aripiprazole]; Ciprofloxacin; Lamictal [lamotrigine]; Peanuts [peanut oil]; Amoxicillin; Doxycycline; Latex; Meloxicam; Mirapex [pramipexole]; Prednisone; Risperdal [risperidone]; Tape; Cortisone; Naproxen; Percocet [oxycodone-acetaminophen]; Other; and Sulfa antibiotics   Review of Systems Review of Systems  Constitutional: Negative for appetite change, chills and fever.  HENT: Negative for ear pain, rhinorrhea, sneezing and sore throat.   Eyes: Positive for photophobia. Negative for visual disturbance.  Respiratory: Negative for cough, chest tightness, shortness of breath and wheezing.   Cardiovascular: Negative for chest pain and palpitations.  Gastrointestinal: Positive for nausea and vomiting. Negative for abdominal pain, blood in stool, constipation and diarrhea.  Genitourinary: Negative for dysuria, hematuria and urgency.  Musculoskeletal: Negative for myalgias.  Skin: Negative for rash.  Neurological: Positive for headaches. Negative for dizziness, weakness and light-headedness.      Physical Exam Updated Vital Signs BP 114/63   Pulse 66   Temp 98.4 F (36.9 C) (Oral)   Resp 12   SpO2 100%   Physical Exam  Constitutional: She is oriented to person, place, and time. She appears well-developed and well-nourished. No distress.  HENT:  Head: Normocephalic and atraumatic.  Nose: Nose normal.  Eyes: Pupils are equal, round, and reactive to light. Conjunctivae and EOM are normal. Right eye exhibits no discharge. Left eye exhibits no discharge. No scleral icterus.  Neck: Normal range of motion. Neck supple.  No meningismus.  Cardiovascular: Normal rate, regular rhythm, normal heart sounds and intact distal pulses. Exam reveals no gallop and no friction rub.  No murmur heard. Pulmonary/Chest: Effort normal and breath sounds normal. No respiratory distress.  Abdominal: Soft. Bowel sounds are normal. She exhibits no distension. There is no tenderness. There is no guarding.  Musculoskeletal: Normal range of motion. She exhibits no edema.  Neurological: She is alert and oriented to person, place, and time. No cranial nerve deficit or sensory deficit. She exhibits normal muscle tone. Coordination normal.  Pupils reactive. No facial asymmetry noted. Cranial nerves appear grossly intact. Sensation intact to light touch on face, BUE and BLE. Strength 5/5 in BUE and BLE. Normal finger-to-nose coordination bilaterally.  Skin: Skin is warm and dry. No rash noted.  Psychiatric: She has a normal mood and affect.  Nursing note and vitals reviewed.    ED Treatments / Results  Labs (all labs ordered are listed, but only abnormal results are displayed) Labs Reviewed  CBC WITH DIFFERENTIAL/PLATELET - Abnormal; Notable for the following components:      Result Value   WBC 19.0 (*)    RBC 5.67 (*)    HCT 47.8 (*)    Lymphs Abs 12.3 (*)    Monocytes Absolute 1.7 (*)    Basophils Absolute 0.2 (*)    All other components within normal limits  BASIC METABOLIC PANEL - Abnormal;  Notable for the following components:   Glucose, Bld 102 (*)    Calcium 10.4 (*)    All other components within normal limits    EKG None  Radiology Ct Head Wo Contrast  Result Date: 12/09/2017 CLINICAL DATA:  Headache. EXAM: CT HEAD WITHOUT CONTRAST TECHNIQUE: Contiguous axial images were obtained from the base of the skull through the vertex without intravenous contrast. COMPARISON:  05/30/2015 FINDINGS: Brain: There is no evidence of  acute infarct, intracranial hemorrhage, mass, midline shift, or extra-axial fluid collection. Small nodules along the margin of the left lateral ventricle are unchanged and demonstrate attenuation equal to that of gray matter. The ventricles are normal in size. Vascular: No hyperdense vessel. Skull: No fracture. Unchanged 16 mm left frontotemporal skull osteoma. Sinuses/Orbits: The visualized paranasal sinuses and mastoid air cells are clear. The orbits are unremarkable. Other: None. IMPRESSION: 1. No evidence of acute intracranial abnormality. 2. Subependymal gray matter heterotopia. Electronically Signed   By: Logan Bores M.D.   On: 12/09/2017 14:46    Procedures Procedures (including critical care time)  Medications Ordered in ED Medications  sodium chloride 0.9 % bolus 1,000 mL (1,000 mLs Intravenous New Bag/Given 12/09/17 1530)  acetaminophen (TYLENOL) tablet 650 mg (has no administration in time range)  sodium chloride 0.9 % bolus 1,000 mL (0 mLs Intravenous Stopped 12/09/17 1516)  metoCLOPramide (REGLAN) injection 10 mg (10 mg Intravenous Given 12/09/17 1410)  diphenhydrAMINE (BENADRYL) injection 25 mg (25 mg Intravenous Given 12/09/17 1535)     Initial Impression / Assessment and Plan / ED Course  I have reviewed the triage vital signs and the nursing notes.  Pertinent labs & imaging results that were available during my care of the patient were reviewed by me and considered in my medical decision making (see chart for details).  Clinical Course  as of Dec 09 1621  Thu Dec 09, 2017  1610 Patient reports significant improvement in symptoms.  Now rates the pain at 4/10. Resting comfortably. Will add on Tylenol for further pain control. Patient states unknown rxn to NSAIDs, unwilling to try.   [HK]    Clinical Course User Index [HK] Delia Heady, PA-C    Patient presents to ED for evaluation of gradual onset headache since last night.  Patient reports having difficulty focusing, one episode of nonbloody, nonbilious emesis.  Patient has a history of chronic headaches and migraines.  The pain is currently located in the temples which is typical for her.  Also in her forehead in bandlike distribution, also typical for her. She denies any new characteristics but states that this is just more severe than her prior headaches.  She did not take any medications prior to arrival to help with those symptoms but try to sleep off the symptoms.  She denies any head injuries or falls, fever, neck pain, history of CVA or aneurysms, thunderclap quality, numbness in arms or legs.  On physical exam she does appear uncomfortable but there is no meningismus or deficits on neurological exam noted.  She is afebrile.  Blood pressure is normal.  CT of the head is unremarkable.  This was done to evaluate for masses that have been giving her her chronic headaches for the past several years.  Lab work shows leukocytosis was just similar to her baseline.  BMP is unremarkable.  Patient was given migraine cocktail with significant improvement in her symptoms. There are no headache characteristics that are lateralizing or concerning for increased ICP, infectious or vascular cause of her symptoms.  Suspect her typical chronic tension type or migraine headache as a cause of her symptoms.  Will advise her to continue Tylenol prn and to follow-up with her primary care provider for further evaluation.  Advised to return to ED for any severe worsening symptoms.  Portions of this note  were generated with [employer]. Dictation errors may occur despite best attempts at proofreading.   Final Clinical Impressions(s) / ED Diagnoses   Final  diagnoses:  Chronic nonintractable headache, unspecified headache type    ED Discharge Orders    None       Delia Heady, PA-C 12/09/17 [place], MD 12/10/17 0700

## 2017-12-10 LAB — PATHOLOGIST SMEAR REVIEW

## 2017-12-15 ENCOUNTER — Encounter: Payer: Self-pay | Admitting: Family Medicine

## 2017-12-15 ENCOUNTER — Ambulatory Visit (INDEPENDENT_AMBULATORY_CARE_PROVIDER_SITE_OTHER): Payer: Medicare Other | Admitting: Family Medicine

## 2017-12-15 VITALS — BP 116/74 | HR 70 | Resp 16 | Ht 63.5 in | Wt 159.4 lb

## 2017-12-15 DIAGNOSIS — F419 Anxiety disorder, unspecified: Secondary | ICD-10-CM | POA: Diagnosis not present

## 2017-12-15 DIAGNOSIS — R519 Headache, unspecified: Secondary | ICD-10-CM

## 2017-12-15 DIAGNOSIS — Z8659 Personal history of other mental and behavioral disorders: Secondary | ICD-10-CM

## 2017-12-15 DIAGNOSIS — C9190 Lymphoid leukemia, unspecified not having achieved remission: Secondary | ICD-10-CM

## 2017-12-15 DIAGNOSIS — R51 Headache: Secondary | ICD-10-CM

## 2017-12-15 DIAGNOSIS — C9110 Chronic lymphocytic leukemia of B-cell type not having achieved remission: Secondary | ICD-10-CM

## 2017-12-15 NOTE — Patient Instructions (Signed)
I will take care of your forms for the [facility]- please let me know if you do want to see neurology after all regarding your headaches

## 2017-12-15 NOTE — Progress Notes (Signed)
[facility] at [facility] [place], [place], [place] [address] [phone]  Date:  12/15/2017   Name:  Morgan [name]   DOB:  August 24, 1965   MRN:  [id number]  PCP:  Darreld Mclean, MD    Chief Complaint: Headache (ER follow up, severe headache, vomiting)   History of Present Illness:  Morgan [name] is a 52 y.o. very pleasant female patient who presents with the following:  Pt was seen by Dr. Ethelene Hal on 5/17 for sinusitis- she was treated with clarithromycin which does seem to have helped with her sinuses However she then called back in on 5/23 with headache.   She described "20 of 10 pain" and was referred to the ER  She tells me about the sx that took her to the ER which are somewhat vague- more of a feeling of just not being right   Patient presents to ED for evaluation of gradual onset headache since last night.  Patient reports having difficulty focusing, one episode of nonbloody, nonbilious emesis.  Patient has a history of chronic headaches and migraines.  The pain is currently located in the temples which is typical for her.  Also in her forehead in bandlike distribution, also typical for her. She denies any new characteristics but states that this is just more severe than her prior headaches.  She did not take any medications prior to arrival to help with those symptoms but try to sleep off the symptoms.  She denies any head injuries or falls, fever, neck pain, history of CVA or aneurysms, thunderclap quality, numbness in arms or legs.  On physical exam she does appear uncomfortable but there is no meningismus or deficits on neurological exam noted.  She is afebrile.  Blood pressure is normal.  CT of the head is unremarkable.  This was done to evaluate for masses that have been giving her her chronic headaches for the past several years.  Lab work shows leukocytosis was just similar to her baseline.  BMP is unremarkable.  Patient was  given migraine cocktail with significant improvement in her symptoms. There are no headache characteristics that are lateralizing or concerning for increased ICP, infectious or vascular cause of her symptoms.  Suspect her typical chronic tension type or migraine headache as a cause of her symptoms.  Will advise her to continue Tylenol prn and to follow-up with her primary care provider for further evaluation.  Advised to return to ED for any severe worsening symptoms. She feels like she is overall better However she has noted escalating HA for about one year.  CT per the ER showed the following: IMPRESSION: 1. No evidence of acute intracranial abnormality. 2. Subependymal gray matter heterotopia.  She has not seen neurology about headaches in some time. Offered to refer her but she is not sure if she wants to go now or not She declines a referral at this time Her HA is overall better now  She is not seeing psychiatry or therapy at this time She is getting concerned about her CLL- she was not sure if she was progressing to a stage of disease that requires active treatment. She did get a 2nd opinion from [place].  For the time being they continue to recommend observation of her disease .  She is not sure if she wishes to continue her care at [facility] or at [facility].   She got a DMV form that needs to be completed- she is  not sure quiet why she gets this, she thinks it has to do with her depression. She denies any wish to hurt herself or others in a vehicle She gave the vision page of the forms to her eye doctor   Patient Active Problem List   Diagnosis Date Noted   Acute non-recurrent maxillary sinusitis 12/03/2017   Dysfunction of right eustachian tube 12/03/2017   Piriformis syndrome of right side 04/22/2017   Lateral epicondylitis of left elbow 04/06/2017   Neck pain 04/06/2017   Sacral back pain 04/06/2017   Solitary pulmonary nodule 10/29/2016   Chronic lymphocytic leukemia ([place]) 07/31/2016    Mass in neck 04/17/2016   Abdominal mass 04/17/2016   Asthma 12/12/2015   Multiple environmental allergies 12/12/2015   Chronic lower back pain 09/12/2015   Bipolar 1 disorder, depressed ([place]) 08/27/2015    Class: Chronic   Other specified hypothyroidism 08/23/2015   Borderline personality disorder ([place]) 08/15/2015   Severe benzodiazepine use disorder ([place]) 08/15/2015   Alcohol use disorder, moderate, dependence ([place]) 08/15/2015   PTSD (post-traumatic stress disorder) 08/15/2015   History of bipolar disorder 08/15/2015   Pre-syncope 05/17/2015   Skull deformity 05/17/2015   Patellofemoral syndrome of both knees 05/02/2015    Past Medical History:  Diagnosis Date   Anxiety    Arthritis    Asthma    Bipolar disorder ([place])    Bulging lumbar disc 07/19/05   Chronic headaches    Chronic lymphocytic leukemia ([place]) 07/31/2016   Colon polyps    Degenerative disorder of bone    Depression    History of borderline personality disorder    Hypothyroidism 2007   Developed after use of Lithium   IBS (irritable bowel syndrome) 1995   Pneumonia    Proctitis    Substance abuse ([place])    ativan last month    Past Surgical History:  Procedure Laterality Date   ABDOMINAL HYSTERECTOMY     APPENDECTOMY     BUNIONECTOMY Right 06/2012   CHOLECYSTECTOMY     OOPHORECTOMY     SHOULDER OPEN ROTATOR CUFF REPAIR Right 03/2010   TUBAL LIGATION      Social History   Tobacco Use   Smoking status: Former Smoker    Last attempt to quit: 07/20/1980    Years since quitting: 37.4   Smokeless tobacco: Never Used  Substance Use Topics   Alcohol use: No    Alcohol/week: 0.0 oz    Comment: unsure   Drug use: Yes    Types: Benzodiazepines    Family History  Problem Relation Age of Onset   Depression Mother    Hypertension Mother    Post-traumatic stress disorder Sister    Alcohol abuse Brother    Drug abuse Brother    Post-traumatic stress  disorder Brother    Thyroid disease Father    Alzheimer's disease Father    COPD Maternal Grandmother    Heart disease Maternal Grandfather    Arthritis Paternal Grandmother    Diabetes Paternal Grandmother    Depression Paternal Grandmother    Alzheimer's disease Paternal Grandmother    Heart disease Paternal Grandfather    Coronary artery disease Paternal Grandfather    Alcohol abuse Paternal Grandfather    Colon cancer Neg Hx    Breast cancer Neg Hx     Allergies  Allergen Reactions   Abilify [Aripiprazole] Anaphylaxis and Swelling    Throat begins to swell    Ciprofloxacin Shortness Of Breath   Lamictal [Lamotrigine] Rash  Peanuts [Peanut Oil] Anaphylaxis, Swelling and Rash    Throat swelling also   Amoxicillin Hives   Doxycycline Hives   Latex Rash   Meloxicam Other (See Comments)    Induces mania   Mirapex [Pramipexole] Hives   Prednisone Other (See Comments)    Interacts with Lithium. " All steroids"   Risperdal [Risperidone] Hives   Tape Rash    Steri-Strips   Cortisone Other (See Comments)    Exacerbates the mania of her bipolar   Naproxen Other (See Comments)    Interacts with lithium   Percocet [Oxycodone-Acetaminophen] Other (See Comments)    Severe dizziness   Other Swelling and Rash    Throat swelling and rash to WALNUTS and PINE NUTS   Sulfa Antibiotics Rash    Medication list has been reviewed and updated.  Current Outpatient Medications on File Prior to Visit  Medication Sig Dispense Refill   acetaminophen (TYLENOL) 500 MG tablet Take 500-1,000 mg by mouth every 6 (six) hours as needed (for headaches or migraines).     albuterol (PROVENTIL HFA;VENTOLIN HFA) 108 (90 Base) MCG/ACT inhaler Inhale 1-2 puffs into the lungs every 6 (six) hours as needed for wheezing or shortness of breath. 1 each 3   aspirin-acetaminophen-caffeine (EXCEDRIN MIGRAINE) 250-250-65 MG tablet Take 1-2 tablets by mouth every 6 (six) hours as  needed for headache or migraine.      Cholecalciferol (VITAMIN D3) 5000 units CAPS Take 5,000 Units by mouth daily.     fluticasone furoate-vilanterol (BREO ELLIPTA) 100-25 MCG/INH AEPB Inhale 1 puff into the lungs daily as needed ("for flares").      hyoscyamine (LEVSIN, ANASPAZ) 0.125 MG tablet Take 1 tablet (0.125 mg total) by mouth as needed for cramping. Reported on 09/24/2015 10 tablet 2   levocetirizine (XYZAL) 5 MG tablet Take 5 mg by mouth as needed.      pseudoephedrine-guaifenesin (MUCINEX D) 60-600 MG 12 hr tablet Take 1 tablet by mouth every 12 (twelve) hours. As needed for stuffy nose and ears. (Patient taking differently: Take 1 tablet by mouth every 12 (twelve) hours as needed (for stuffy nose, ear pressure, and congestion). ) 20 tablet 0   No current facility-administered medications on file prior to visit.     Review of Systems:  As per HPI- otherwise negative.   Physical Examination: Vitals:   12/15/17 1138  BP: 116/74  Pulse: 70  Resp: 16  SpO2: 99%   Vitals:   12/15/17 1138  Weight: 159 lb 6.4 oz (72.3 kg)  Height: 5' 3.5" (1.613 m)   Body mass index is 27.79 kg/m. Ideal Body Weight: Weight in (lb) to have BMI = 25: 143.1  GEN: WDWN, NAD, Non-toxic, A & O x 3, overweight, looks well  HEENT: Atraumatic, Normocephalic. Neck supple. No masses, No LAD.  Bilateral TM wnl, oropharynx normal.  PEERL,EOMI.   Ears and Nose: No external deformity. CV: RRR, No M/G/R. No JVD. No thrill. No extra heart sounds. PULM: CTA B, no wheezes, crackles, rhonchi. No retractions. No resp. distress. No accessory muscle use. ABD: S, NT, ND, +BS. No rebound. No HSM. EXTR: No c/c/e NEURO Normal gait.  PSYCH: Normally interactive. Conversant. Not depressed or anxious appearing.  Calm demeanor.    Assessment and Plan: CLL (chronic lymphocytic leukemia) (HCC)  Anxiety  History of bipolar disorder  Persistent headaches  Following up from recent ER visit At this time she  is feeling better and does not need any further eval for her HA. Declines to  see neurology She is seeing both [facility] and [facility] right now for her CLL- encouraged her to pick one to avoid confusion She reports stable mood right now, no SI Completed her DMV paperwork and left at front for her to pick up   Signed Lamar Blinks, MD

## 2017-12-22 ENCOUNTER — Telehealth: Payer: Self-pay | Admitting: *Deleted

## 2017-12-22 DIAGNOSIS — H5203 Hypermetropia, bilateral: Secondary | ICD-10-CM | POA: Diagnosis not present

## 2017-12-22 DIAGNOSIS — H52223 Regular astigmatism, bilateral: Secondary | ICD-10-CM | POA: Diagnosis not present

## 2017-12-22 DIAGNOSIS — H524 Presbyopia: Secondary | ICD-10-CM | POA: Diagnosis not present

## 2017-12-22 NOTE — Telephone Encounter (Signed)
Patient called about dmv paperwork.  Advised that it is ready for pickup.

## 2017-12-28 ENCOUNTER — Telehealth: Payer: Self-pay | Admitting: *Deleted

## 2017-12-28 MED ORDER — PROCHLORPERAZINE MALEATE 10 MG PO TABS: 10.0000 mg | ORAL_TABLET | Freq: Four times a day (QID) | ORAL | 0 refills | Status: DC | PRN

## 2017-12-28 NOTE — Telephone Encounter (Signed)
Message received off RN Triage Voice Mail: Patient c/o nausea and dizziness which is made worse with talking and activity. She states these symptoms started within the last 24h. She also c/o abdominal pain which started approximately 20 minutes ago.   Recently this office received notification from [facility] that patient was transferring care to that office. Patient states she is unsure at that time which office she wants to be the primary.   From speaking with patient it sounds like she's experiencing fullness, bloating, and nausea after attempting to eat a meal. Patient does have abdominal lymphadenopathy.  Instructed patient to eat 5-7 small meals through out the day. We will also send in an antiemetic for patient to try. She states she will try these methods to increase po intake.   She will call the office back if needed. She has a follow up appointment in August.

## 2018-01-02 ENCOUNTER — Encounter: Payer: Self-pay | Admitting: Family Medicine

## 2018-01-02 DIAGNOSIS — B351 Tinea unguium: Secondary | ICD-10-CM

## 2018-01-04 MED ORDER — TERBINAFINE HCL 250 MG PO TABS: 250.0000 mg | ORAL_TABLET | Freq: Every day | ORAL | 0 refills | Status: DC

## 2018-01-04 NOTE — Addendum Note (Signed)
Addended by: Lamar Blinks C on: 01/04/2018 01:49 PM   Modules accepted: Orders

## 2018-01-05 ENCOUNTER — Encounter: Payer: Self-pay | Admitting: Hematology & Oncology

## 2018-01-10 ENCOUNTER — Encounter: Payer: Self-pay | Admitting: Family Medicine

## 2018-01-10 ENCOUNTER — Ambulatory Visit (INDEPENDENT_AMBULATORY_CARE_PROVIDER_SITE_OTHER): Payer: Medicare Other | Admitting: Family Medicine

## 2018-01-10 VITALS — BP 120/70 | HR 74 | Resp 16 | Ht 63.5 in | Wt 156.0 lb

## 2018-01-10 DIAGNOSIS — Z131 Encounter for screening for diabetes mellitus: Secondary | ICD-10-CM

## 2018-01-10 DIAGNOSIS — L608 Other nail disorders: Secondary | ICD-10-CM

## 2018-01-10 DIAGNOSIS — C9190 Lymphoid leukemia, unspecified not having achieved remission: Secondary | ICD-10-CM | POA: Diagnosis not present

## 2018-01-10 DIAGNOSIS — C9110 Chronic lymphocytic leukemia of B-cell type not having achieved remission: Secondary | ICD-10-CM

## 2018-01-10 NOTE — Progress Notes (Signed)
[place] at [facility], [place], [place] [address] [phone]  Date:  01/10/2018   Name:  Morgan [name]   DOB:  Oct 14, 1965   MRN:  [id number]  PCP:  Darreld Mclean, MD    Chief Complaint: Medication Management (oral rx for toe issue, not to take oral due to oncologist)   History of Present Illness:  Morgan [name] is a 52 y.o. very pleasant female patient who presents with the following: Pt had send me photos of discolored toenails- no pain, no thickening. She has noted color changes for years- some dark yellow of nails and some white.  We had planned to use a course of lamisil for her She was not able to take lamisil unfortunately due to her CLL- she checked with Dr. Marin Olp She is worried about diabetes in her feet, and would like to check her A1c today She is otherwise doing ok, would now like to have a consultation with a doc at [facility] for her CLL, needs a referral.  This is ok with me It looks like she will have to start treatment for her CLL as it is progressing unfortunately   Lab Results  Component Value Date   HGBA1C 5.8 11/11/2016   She is currently using nail polish to cover her nail discoloration and does have dark polish on now   Patient Active Problem List   Diagnosis Date Noted   Acute non-recurrent maxillary sinusitis 12/03/2017   Dysfunction of right eustachian tube 12/03/2017   Piriformis syndrome of right side 04/22/2017   Lateral epicondylitis of left elbow 04/06/2017   Neck pain 04/06/2017   Sacral back pain 04/06/2017   Solitary pulmonary nodule 10/29/2016   Chronic lymphocytic leukemia ([place]) 07/31/2016   Mass in neck 04/17/2016   Abdominal mass 04/17/2016   Asthma 12/12/2015   Multiple environmental allergies 12/12/2015   Chronic lower back pain 09/12/2015   Bipolar 1 disorder, depressed ([place]) 08/27/2015    Class: Chronic   Other specified hypothyroidism 08/23/2015    Borderline personality disorder ([place]) 08/15/2015   Severe benzodiazepine use disorder ([place]) 08/15/2015   Alcohol use disorder, moderate, dependence ([place]) 08/15/2015   PTSD (post-traumatic stress disorder) 08/15/2015   History of bipolar disorder 08/15/2015   Pre-syncope 05/17/2015   Skull deformity 05/17/2015   Patellofemoral syndrome of both knees 05/02/2015    Past Medical History:  Diagnosis Date   Anxiety    Arthritis    Asthma    Bipolar disorder ([place])    Bulging lumbar disc 07/19/05   Chronic headaches    Chronic lymphocytic leukemia ([place]) 07/31/2016   Colon polyps    Degenerative disorder of bone    Depression    History of borderline personality disorder    Hypothyroidism 2007   Developed after use of Lithium   IBS (irritable bowel syndrome) 1995   Pneumonia    Proctitis    Substance abuse ([place])    ativan last month    Past Surgical History:  Procedure Laterality Date   ABDOMINAL HYSTERECTOMY     APPENDECTOMY     BUNIONECTOMY Right 06/2012   CHOLECYSTECTOMY     OOPHORECTOMY     SHOULDER OPEN ROTATOR CUFF REPAIR Right 03/2010   TUBAL LIGATION      Social History   Tobacco Use   Smoking status: Former Smoker    Last attempt to quit: 07/20/1980    Years since quitting: 37.5   Smokeless  tobacco: Never Used  Substance Use Topics   Alcohol use: No    Alcohol/week: 0.0 oz    Comment: unsure   Drug use: Yes    Types: Benzodiazepines    Family History  Problem Relation Age of Onset   Depression Mother    Hypertension Mother    Post-traumatic stress disorder Sister    Alcohol abuse Brother    Drug abuse Brother    Post-traumatic stress disorder Brother    Thyroid disease Father    Alzheimer's disease Father    COPD Maternal Grandmother    Heart disease Maternal Grandfather    Arthritis Paternal Grandmother    Diabetes Paternal Grandmother    Depression Paternal Grandmother    Alzheimer's disease  Paternal Grandmother    Heart disease Paternal Grandfather    Coronary artery disease Paternal Grandfather    Alcohol abuse Paternal Grandfather    Colon cancer Neg Hx    Breast cancer Neg Hx     Allergies  Allergen Reactions   Abilify [Aripiprazole] Anaphylaxis and Swelling    Throat begins to swell    Ciprofloxacin Shortness Of Breath   Lamictal [Lamotrigine] Rash   Peanuts [Peanut Oil] Anaphylaxis, Swelling and Rash    Throat swelling also   Amoxicillin Hives   Doxycycline Hives   Latex Rash   Meloxicam Other (See Comments)    Induces mania   Mirapex [Pramipexole] Hives   Prednisone Other (See Comments)    Interacts with Lithium. " All steroids"   Risperdal [Risperidone] Hives   Tape Rash    Steri-Strips   Cortisone Other (See Comments)    Exacerbates the mania of her bipolar   Naproxen Other (See Comments)    Interacts with lithium   Percocet [Oxycodone-Acetaminophen] Other (See Comments)    Severe dizziness   Other Swelling and Rash    Throat swelling and rash to WALNUTS and PINE NUTS   Sulfa Antibiotics Rash    Medication list has been reviewed and updated.  Current Outpatient Medications on File Prior to Visit  Medication Sig Dispense Refill   acetaminophen (TYLENOL) 500 MG tablet Take 500-1,000 mg by mouth every 6 (six) hours as needed (for headaches or migraines).     albuterol (PROVENTIL HFA;VENTOLIN HFA) 108 (90 Base) MCG/ACT inhaler Inhale 1-2 puffs into the lungs every 6 (six) hours as needed for wheezing or shortness of breath. 1 each 3   aspirin-acetaminophen-caffeine (EXCEDRIN MIGRAINE) 250-250-65 MG tablet Take 1-2 tablets by mouth every 6 (six) hours as needed for headache or migraine.      Cholecalciferol (VITAMIN D3) 5000 units CAPS Take 5,000 Units by mouth daily.     fluticasone furoate-vilanterol (BREO ELLIPTA) 100-25 MCG/INH AEPB Inhale 1 puff into the lungs daily as needed ("for flares").      hyoscyamine (LEVSIN,  ANASPAZ) 0.125 MG tablet Take 1 tablet (0.125 mg total) by mouth as needed for cramping. Reported on 09/24/2015 10 tablet 2   levocetirizine (XYZAL) 5 MG tablet Take 5 mg by mouth as needed.      prochlorperazine (COMPAZINE) 10 MG tablet Take 1 tablet (10 mg total) by mouth every 6 (six) hours as needed for nausea or vomiting. 30 tablet 0   pseudoephedrine-guaifenesin (MUCINEX D) 60-600 MG 12 hr tablet Take 1 tablet by mouth every 12 (twelve) hours. As needed for stuffy nose and ears. (Patient taking differently: Take 1 tablet by mouth every 12 (twelve) hours as needed (for stuffy nose, ear pressure, and congestion). ) 20 tablet  0   terbinafine (LAMISIL) 250 MG tablet Take 1 tablet (250 mg total) by mouth daily. 90 tablet 0   No current facility-administered medications on file prior to visit.     Review of Systems:  As per HPI- otherwise negative. No fever or chills She continues to feel tired and worried about her CLL    Physical Examination: Vitals:   01/10/18 1528  BP: 120/70  Pulse: 74  Resp: 16  SpO2: 98%   Vitals:   01/10/18 1528  Weight: 156 lb (70.8 kg)  Height: 5' 3.5" (1.613 m)   Body mass index is 27.2 kg/m. Ideal Body Weight: Weight in (lb) to have BMI = 25: 143.1  GEN: WDWN, NAD, Non-toxic, A & O x 3, mild overweight, looks well  HEENT: Atraumatic, Normocephalic. Neck supple. No masses, No LAD. Ears and Nose: No external deformity. CV: RRR, No M/G/R. No JVD. No thrill. No extra heart sounds. PULM: CTA B, no wheezes, crackles, rhonchi. No retractions. No resp. distress. No accessory muscle use. EXTR: No c/c/e NEURO Normal gait.  PSYCH: Normally interactive. Conversant. Not depressed or anxious appearing.  Calm demeanor.  Normal foot exam for her today  Nail polish on toes today    Assessment and Plan: Screening for diabetes mellitus - Plan: Hemoglobin A1c  Change in nail appearance  CLL (chronic lymphocytic leukemia) ([place]) - Plan: Ambulatory referral  to Hematology / Oncology  She has had borderline A1c in the past- repeat for her today Discussed nail issue- she sees derm, will have her go to her next visit without polish to get an opinion.  However as she cannot use lamisil and topical treatments are quite expensive likely will just observe Did referral for her as requested   Signed Lamar Blinks, MD

## 2018-01-10 NOTE — Patient Instructions (Addendum)
It was good to see you today! The next time you see your dermatologist, go with your nails UNpolished so he can see them. The rest of the time however it is fine to keep nail polish on to cover the discoloration if you like  I did a referral to Dr. Dorothea Ogle at [facility] for you to see- you can call them if you like!   Take care

## 2018-01-11 ENCOUNTER — Encounter: Payer: Self-pay | Admitting: Family Medicine

## 2018-01-11 LAB — HEMOGLOBIN A1C: Hgb A1c MFr Bld: 6.1 % (ref 4.6–6.5)

## 2018-01-13 ENCOUNTER — Telehealth: Payer: Self-pay

## 2018-01-13 NOTE — Telephone Encounter (Signed)
Copied from [employer] [id number]. Topic: Referral - Question >> Jan 13, 2018 12:22 PM Judyann Munson wrote: Reason for CRM:  Jason Coop from [facility] is calling inform that the doctor doesn't have anything available until Aug . She is requesting a call back. Her best contact number is [phone]. Please advise

## 2018-01-13 NOTE — Telephone Encounter (Signed)
Please call her back.  She has already gotten two opinions I believe from our oncology dept and [facility]. I can't make the doctor at [facility] see her faster  I would advise her to seek care from either [facility] or [facility] until [facility] can see her if she does want to get a 3rd opinion from them

## 2018-01-17 ENCOUNTER — Encounter: Payer: Self-pay | Admitting: Family Medicine

## 2018-01-17 ENCOUNTER — Encounter: Payer: Self-pay | Admitting: Hematology & Oncology

## 2018-01-17 NOTE — Telephone Encounter (Signed)
No answer, sent patient my chart message.

## 2018-01-19 ENCOUNTER — Ambulatory Visit (INDEPENDENT_AMBULATORY_CARE_PROVIDER_SITE_OTHER): Payer: Medicare Other | Admitting: Medical

## 2018-01-19 ENCOUNTER — Encounter: Payer: Self-pay | Admitting: Hematology & Oncology

## 2018-01-19 ENCOUNTER — Telehealth: Payer: Self-pay | Admitting: Medical

## 2018-01-19 ENCOUNTER — Encounter: Payer: Self-pay | Admitting: Medical

## 2018-01-19 ENCOUNTER — Telehealth: Payer: Self-pay

## 2018-01-19 VITALS — BP 108/70 | HR 77 | Temp 98.4°F | Resp 16 | Ht 63.0 in | Wt 153.4 lb

## 2018-01-19 DIAGNOSIS — R42 Dizziness and giddiness: Secondary | ICD-10-CM

## 2018-01-19 DIAGNOSIS — R109 Unspecified abdominal pain: Secondary | ICD-10-CM | POA: Diagnosis not present

## 2018-01-19 DIAGNOSIS — R002 Palpitations: Secondary | ICD-10-CM | POA: Diagnosis not present

## 2018-01-19 DIAGNOSIS — Z862 Personal history of diseases of the blood and blood-forming organs and certain disorders involving the immune mechanism: Secondary | ICD-10-CM | POA: Diagnosis not present

## 2018-01-19 LAB — COMPREHENSIVE METABOLIC PANEL
ALK PHOS: 50 U/L (ref 39–117)
ALT: 16 U/L (ref 0–35)
AST: 24 U/L (ref 0–37)
Albumin: 4.7 g/dL (ref 3.5–5.2)
BILIRUBIN TOTAL: 0.6 mg/dL (ref 0.2–1.2)
BUN: 9 mg/dL (ref 6–23)
CALCIUM: 10.2 mg/dL (ref 8.4–10.5)
CO2: 30 mEq/L (ref 19–32)
Chloride: 105 mEq/L (ref 96–112)
Creatinine, Ser: 0.8 mg/dL (ref 0.40–1.20)
GFR: 79.73 mL/min (ref >60.00)
Glucose, Bld: 86 mg/dL (ref 70–99)
POTASSIUM: 4.4 meq/L (ref 3.5–5.1)
Sodium: 142 mEq/L (ref 135–145)
TOTAL PROTEIN: 6.6 g/dL (ref 6.0–8.3)

## 2018-01-19 LAB — POC URINALSYSI DIPSTICK (AUTOMATED)
BILIRUBIN UA: NEGATIVE
Glucose, UA: NEGATIVE
Ketones, UA: NEGATIVE
LEUKOCYTES UA: NEGATIVE
NITRITE UA: NEGATIVE
PH UA: 7 (ref 5.0–8.0)
PROTEIN UA: NEGATIVE
RBC UA: NEGATIVE
Spec Grav, UA: 1.01 (ref 1.010–1.025)
UROBILINOGEN UA: 0.2 U/dL

## 2018-01-19 LAB — CBC WITH DIFFERENTIAL/PLATELET
BASOS ABS: 0.1 10*3/uL (ref 0.0–0.1)
Basophils Relative: 0.2 % (ref 0.0–3.0)
Eosinophils Absolute: 0.5 10*3/uL (ref 0.0–0.7)
Eosinophils Relative: 1.2 % (ref 0.0–5.0)
HEMATOCRIT: 42.5 % (ref 36.0–46.0)
Hemoglobin: 13.9 g/dL (ref 12.0–15.0)
LYMPHS ABS: 35.5 10*3/uL — AB (ref 0.7–4.0)
LYMPHS PCT: 90.1 % — AB (ref 12.0–46.0)
MCHC: 32.7 g/dL (ref 30.0–36.0)
MCV: 83.7 fl (ref 78.0–100.0)
MONOS PCT: 0.5 % — AB (ref 3.0–12.0)
Monocytes Absolute: 0.2 10*3/uL (ref 0.1–1.0)
NEUTROS ABS: 3.2 10*3/uL (ref 1.4–7.7)
NEUTROS PCT: 8 % — AB (ref 43.0–77.0)
Platelets: 162 10*3/uL (ref 150.0–400.0)
RBC: 5.08 Mil/uL (ref 3.87–5.11)
RDW: 15 % (ref 11.5–15.5)
WBC: 39.4 10*3/uL (ref 4.0–10.5)

## 2018-01-19 LAB — GLUCOSE, POCT (MANUAL RESULT ENTRY): POC Glucose: 86 mg/dl (ref 70–99)

## 2018-01-19 MED ORDER — METRONIDAZOLE 500 MG PO TABS: 500.0000 mg | ORAL_TABLET | Freq: Three times a day (TID) | ORAL | 0 refills | Status: DC

## 2018-01-19 MED ORDER — MECLIZINE HCL 12.5 MG PO TABS: 12.5000 mg | ORAL_TABLET | Freq: Three times a day (TID) | ORAL | 0 refills | Status: DC | PRN

## 2018-01-19 NOTE — Telephone Encounter (Signed)
CRITICAL VALUE STICKER  CRITICAL VALUE: WBC 39.5  RECEIVER (on-site recipient of call): Raynelle Dick, RN  DATE & TIME NOTIFIED: 01/19/18 1625  MESSENGER (representative from lab): not identified  MD NOTIFIED: Mackie Pai, PA  TIME OF NOTIFICATION:1651  RESPONSE: OK, will route to Dr. Marin Olp, oncologist to review.

## 2018-01-19 NOTE — Progress Notes (Signed)
Subjective:    Patient ID: Morgan [name], female    DOB: [birth year], 52 y.o.   MRN: [id number]  HPI  Pt in for evaluation.  Pt states recent heart racing sensation on and off. She felt this on Sunday. This does happen intermittently in the past. She felt brief nauseau, dizzy and weak. Pt states at church RN and MD talked with her in church. She declined ED evaluation. Eventually she rested and symptoms resolved. No symptomatic tachycardia reoccurence. Also this is only event she had recently. Though at times she will glance at her apple watch and see rate of 115 or so approximate but notes asymptomatic. This can occur at rest. See describes not constant tachycardia.(today in office after ekg she saw rate of 76 on apple watch)  Pt expressed directly to jasmine she did want ekg. She had work up in the past. Some abnormality noted but pt does not specify. Eventually after discussion with me she agreed to get in light.  She update me that in 2015 had work up for syncope but was found to be anemic at that time. Also around that time holter was done. She states initially holter was negative but then remember MD/cardiologist describe relatively slight abnormality that many person can have. But then was cleared to run marathon. She never ran marathon since she did not have time to train.  On Thursday, Friday and Saturday she felt some left flank pain. Would last for one minute approximate. No history of diverticultis.Hx of ibs. Pt had colonoscopy in 2017 which was negative. No diverticulosis seen.  Some pain during exam but then resolved. No recent diarrhea.  Also reports some intermittent episodes of very brief dizziness over past 2 weeks.(not experiencing this today) IN 2015 she had intermittent episodes of syncope. Found to be anemic at that time.   Pt has CLL. She is about to start treatment in near future. Has trip planned for [place] next week to see family. She really wants to go to this  trip.   Review of Systems  Constitutional: Negative for chills, fatigue and fever.  Respiratory: Negative for cough, chest tightness and wheezing.   Cardiovascular: Positive for palpitations. Negative for chest pain.       One episode of tachycardia/fast heart rate sensation.  Gastrointestinal: Positive for abdominal pain.       Left flank area pain. See hpi.  Musculoskeletal: Negative for back pain, joint swelling and neck stiffness.  Skin: Negative for rash.  Neurological: Positive for dizziness. Negative for headaches.       No know but happens intermittently.  Hematological: Negative for adenopathy. Does not bruise/bleed easily.  Psychiatric/Behavioral: Negative for agitation, confusion, self-injury and suicidal ideas. The patient is not nervous/anxious.    Past Medical History:  Diagnosis Date   Anxiety    Arthritis    Asthma    Bipolar disorder ([place])    Bulging lumbar disc 07/19/05   Chronic headaches    Chronic lymphocytic leukemia ([place]) 07/31/2016   Colon polyps    Degenerative disorder of bone    Depression    History of borderline personality disorder    Hypothyroidism 2007   Developed after use of Lithium   IBS (irritable bowel syndrome) 1995   Pneumonia    Proctitis    Substance abuse ([place])    ativan last month     Social History   Socioeconomic History   Marital status: Divorced    Spouse name: Not on file  Number of children: 2   Years of education: 14   Highest education level: Not on file  Occupational History   Occupation: Unemployed  Scientist, product/process development strain: Not on file   Food insecurity:    Worry: Not on file    Inability: Not on file   Transportation needs:    Medical: Not on file    Non-medical: Not on file  Tobacco Use   Smoking status: Former Smoker    Last attempt to quit: 07/20/1980    Years since quitting: 37.5   Smokeless tobacco: Never Used  Substance and Sexual Activity   Alcohol use:  No    Alcohol/week: 0.0 oz    Comment: unsure   Drug use: Yes    Types: Benzodiazepines   Sexual activity: Never    Birth control/protection: Surgical    Comment: intercourse age unknown,sexual partners less than 5  Lifestyle   Physical activity:    Days per week: Not on file    Minutes per session: Not on file   Stress: Not on file  Relationships   Social connections:    Talks on phone: Not on file    Gets together: Not on file    Attends religious service: Not on file    Active member of club or organization: Not on file    Attends meetings of clubs or organizations: Not on file    Relationship status: Not on file   Intimate partner violence:    Fear of current or ex partner: Not on file    Emotionally abused: Not on file    Physically abused: Not on file    Forced sexual activity: Not on file  Other Topics Concern   Not on file  Social History Narrative   Fun: [place], travel, music, writing, walking, hiking, volunteering   Denies religious beliefs effecting health care.    Feels safe at home.     Past Surgical History:  Procedure Laterality Date   ABDOMINAL HYSTERECTOMY     APPENDECTOMY     BUNIONECTOMY Right 06/2012   CHOLECYSTECTOMY     OOPHORECTOMY     SHOULDER OPEN ROTATOR CUFF REPAIR Right 03/2010   TUBAL LIGATION      Family History  Problem Relation Age of Onset   Depression Mother    Hypertension Mother    Post-traumatic stress disorder Sister    Alcohol abuse Brother    Drug abuse Brother    Post-traumatic stress disorder Brother    Thyroid disease Father    Alzheimer's disease Father    COPD Maternal Grandmother    Heart disease Maternal Grandfather    Arthritis Paternal Grandmother    Diabetes Paternal Grandmother    Depression Paternal Grandmother    Alzheimer's disease Paternal Grandmother    Heart disease Paternal Grandfather    Coronary artery disease Paternal Grandfather    Alcohol abuse Paternal  Grandfather    Colon cancer Neg Hx    Breast cancer Neg Hx     Allergies  Allergen Reactions   Abilify [Aripiprazole] Anaphylaxis and Swelling    Throat begins to swell    Ciprofloxacin Shortness Of Breath   Lamictal [Lamotrigine] Rash   Peanuts [Peanut Oil] Anaphylaxis, Swelling and Rash    Throat swelling also   Amoxicillin Hives   Doxycycline Hives   Latex Rash   Meloxicam Other (See Comments)    Induces mania   Mirapex [Pramipexole] Hives   Prednisone Other (See Comments)  Interacts with Lithium. " All steroids"   Risperdal [Risperidone] Hives   Tape Rash    Steri-Strips   Cortisone Other (See Comments)    Exacerbates the mania of her bipolar   Naproxen Other (See Comments)    Interacts with lithium   Percocet [Oxycodone-Acetaminophen] Other (See Comments)    Severe dizziness   Other Swelling and Rash    Throat swelling and rash to WALNUTS and PINE NUTS   Sulfa Antibiotics Rash    Current Outpatient Medications on File Prior to Visit  Medication Sig Dispense Refill   acetaminophen (TYLENOL) 500 MG tablet Take 500-1,000 mg by mouth every 6 (six) hours as needed (for headaches or migraines).     albuterol (PROVENTIL HFA;VENTOLIN HFA) 108 (90 Base) MCG/ACT inhaler Inhale 1-2 puffs into the lungs every 6 (six) hours as needed for wheezing or shortness of breath. 1 each 3   aspirin-acetaminophen-caffeine (EXCEDRIN MIGRAINE) 250-250-65 MG tablet Take 1-2 tablets by mouth every 6 (six) hours as needed for headache or migraine.      Cholecalciferol (VITAMIN D3) 5000 units CAPS Take 5,000 Units by mouth daily.     fluticasone furoate-vilanterol (BREO ELLIPTA) 100-25 MCG/INH AEPB Inhale 1 puff into the lungs daily as needed ("for flares").      hyoscyamine (LEVSIN, ANASPAZ) 0.125 MG tablet Take 1 tablet (0.125 mg total) by mouth as needed for cramping. Reported on 09/24/2015 10 tablet 2   prochlorperazine (COMPAZINE) 10 MG tablet Take 1 tablet (10 mg  total) by mouth every 6 (six) hours as needed for nausea or vomiting. 30 tablet 0   No current facility-administered medications on file prior to visit.     BP 108/70    Pulse 77    Temp 98.4 F (36.9 C) (Oral)    Resp 16    Ht 5\' 3"  (1.6 m)    Wt 153 lb 6.4 oz (69.6 kg)    SpO2 99%    BMI 27.17 kg/m       Objective:   Physical Exam  General Mental Status- Alert. General Appearance- Not in acute distress.   Skin General: Color- Normal Color. Moisture- Normal Moisture.  Neck Carotid Arteries- Normal color. Moisture- Normal Moisture. No carotid bruits. No JVD.  Chest and Lung Exam Auscultation: Breath Sounds:-Normal.  Cardiovascular Auscultation:Rythm- Regular. Murmurs & Other Heart Sounds:Auscultation of the heart reveals- No Murmurs.  Abdomen Inspection:-Inspeection Normal. Palpation/Percussion:Note:No mass. Palpation and Percussion of the abdomen reveal- faint left upper flank regin Tender, Non Distended + BS, no rebound or guarding.  Back- no cva tenderness.  Neurologic Cranial Nerve exam:- CN III-XII intact(No nystagmus), symmetric smile. Drift Test:- No drift. Finger to Nose:- Normal/Intact Strength:- 5/5 equal and symmetric strength both upper and lower extremities.     Assessment & Plan:  You have had one brief episode of tachycardia felt on Sunday.  Your EKG today does show a rate of 114 with sinus rhythm but finding of probable short PR interval.  This finding can at times lead to tachycardia and some cases.  Since you only had one event of symptoms recently, I am going to send message to Dr. Charlett Blake and see if we might give you a medication called on beta-blocker to decrease your heart rate.  If you do have a smart watch would asked that you check your heart rate intermittently during the day and note how many times it is above 100.  I do think in light of this short PR interval finding that referral back  to cardiologist would be beneficial.  I understand that  you have the upcoming trip to [place] but I could try to schedule that as you come back.  If you do have any tachycardia with major sinus symptoms then would recommend ED evaluation.  Particularly if you note any pulse rates above 150.  We will try to update you today or tomorrow regarding possible use of a beta-blocker.  For your recent symptoms of dizziness and left flank pain, I do want you to get a CBC and metabolic panel today.  We will see if you have any recurrent anemia.  Your urine did look clear.  Since urine does look clear this decreases the chance of a diagnosis such as kidney stone.  However if your flank pain does become severe or increased recommend ED evaluation.  I am going to provide you with a print prescription Flagyl. (if you start this let me know as need to find 2nd antibiotic that you can take. Severe allergy hx prohibitive).  If you develop left lower quadrant would recommend that you start these.  Particular making these available as you will be on vacation soon.   For intermittent dizziness also will make meclizine available.  If you get dizziness with neurologic type signs symptoms and recommend ED evaluation as well.  Follow-up in 10 to 14 days or as needed.   40 minutes spent with the patient.  50% of time counseling patient on differential diagnosis of her tachycardia/palpitations, dizziness and abdomen pain.    Mackie Pai, PA-C

## 2018-01-19 NOTE — Telephone Encounter (Signed)
Reviewed. Notified pt and sent result note to Dr. Marin Olp Pt has CLL.

## 2018-01-19 NOTE — Patient Instructions (Addendum)
You have had one brief episode of tachycardia felt on Sunday.  Your EKG today does show a rate of 114 with sinus rhythm but finding of probable short PR interval.  This finding can at times lead to tachycardia and some cases.  Since you only had one event of symptoms recently, I am going to send message to Dr. Charlett Blake and see if we might give you a medication called on beta-blocker to decrease your heart rate.  If you do have a smart watch would asked that you check your heart rate intermittently during the day and note how many times it is above 100.  I do think in light of this short PR interval finding that referral back to cardiologist would be beneficial.  I understand that you have the upcoming trip to [place] but I could try to schedule that as you come back.  If you do have any tachycardia with major sinus symptoms then would recommend ED evaluation.  Particularly if you note any pulse rates above 150.  We will try to update you today or tomorrow regarding possible use of a beta-blocker.  For your recent symptoms of dizziness and left flank pain, I do want you to get a CBC and metabolic panel today.  We will see if you have any recurrent anemia.  Your urine did look clear.  Since urine does look clear this decreases the chance of a diagnosis such as kidney stone.  However if your flank pain does become severe or increased recommend ED evaluation.  I am going to provide you with a print prescription of Flagyl(if you start this let me know as need to find 2nd antibiotic that you can take. Severe allergy hx prohibitive).  If you develop left lower quadrant would recommend that you start these.  Particular making these available as you will be on vacation soon.  For intermittent dizziness also will make meclizine available.  If you get dizziness with neurologic type signs symptoms and recommend ED evaluation as well.  Follow-up in 10 to 14 days or as needed.

## 2018-01-19 NOTE — Telephone Encounter (Signed)
CRITICAL VALUE STICKER  CRITICAL VALUE: WBC 39.4  RECEIVER (on-site recipient of call): Author  DATE & TIME NOTIFIED: 01/19/18, 1625.  MESSENGER (representative from lab): name unknown  MD NOTIFIED: Mackie Pai, PA  TIME OF NOTIFICATION: 16:27  RESPONSE:

## 2018-01-19 NOTE — Telephone Encounter (Signed)
Dr. Charlett Blake,  I sent  you copy of this patient's note today.  Wanted your advice regarding potentially prescribing her beta-blocker.    She had one recent event of subjective tachycardia associated with nausea, dizziness and feeling weak.  She rested and symptoms subsided relatively quickly.  EKG today looks like short PR interval.  Her rate on the EKG was about 114.  She was not symptomatic during ekg.  Later during the interview on her apple watch her right was normal (I think an approximate 76 range.)  I went ahead and did place referral to cardiology.  I am asking her to check her pulse with her apple watch and see if she is consistently running tachycardia even at rest.  Also notify us of any recurrent symptoms.  Presently I was hesitant to start her on a beta-blocker with just 1  Recent symptomatic event.  Just wanted your opinion.  Thanks for your help.  Mackie Pai, PA-C

## 2018-01-20 ENCOUNTER — Telehealth: Payer: Self-pay | Admitting: Medical

## 2018-01-20 NOTE — Telephone Encounter (Signed)
I think with that history it is prudent to be cautious. If she has frequent episodes can start Metoprolol Tartrate 25 mg tab, 1/2 tab po bid so if she has any concerning side effects it will clear from her system quickly

## 2018-01-20 NOTE — Telephone Encounter (Signed)
Would you call patient and let her know did send Dr. Charlett Blake message and she does agree with me that will hold off on giving her medication presently for the symptomatic fast heart rate event. But if she gets recurrent events then would send in medication pending referral to the cardiologist. How is she doing?

## 2018-01-27 ENCOUNTER — Other Ambulatory Visit (INDEPENDENT_AMBULATORY_CARE_PROVIDER_SITE_OTHER): Payer: Medicare Other

## 2018-01-27 DIAGNOSIS — R109 Unspecified abdominal pain: Secondary | ICD-10-CM | POA: Diagnosis not present

## 2018-01-27 LAB — FECAL OCCULT BLOOD, IMMUNOCHEMICAL: Fecal Occult Bld: NEGATIVE

## 2018-01-28 ENCOUNTER — Telehealth: Payer: Self-pay

## 2018-01-28 NOTE — Telephone Encounter (Signed)
Copied from [place] [id number]. Topic: Referral - Question >> Jan 27, 2018 11:47 AM Bea Graff, NT wrote: Reason for CRM: Cassandra with [facility] calling and states they need a pathology, psychology and Fish report sent over to them in regards to the diagnosis of CLL. CB#: [phone] Fax#: [phone]

## 2018-01-28 NOTE — Telephone Encounter (Signed)
Will forward to Dr. Marin Olp as I am not sure that I will send the correct materials.  Morgan [name], Morgan [name] has decided to seek a 3rd opinion now at [facility]. Can you ask your staff to send the requested materials?  Thank you Jess

## 2018-02-18 ENCOUNTER — Inpatient Hospital Stay: Payer: Medicare Other | Attending: Family

## 2018-02-18 ENCOUNTER — Encounter: Payer: Self-pay | Admitting: Hematology & Oncology

## 2018-02-18 ENCOUNTER — Other Ambulatory Visit: Payer: Self-pay | Admitting: *Deleted

## 2018-02-18 ENCOUNTER — Other Ambulatory Visit: Payer: Self-pay

## 2018-02-18 ENCOUNTER — Telehealth: Payer: Self-pay | Admitting: *Deleted

## 2018-02-18 ENCOUNTER — Inpatient Hospital Stay (HOSPITAL_BASED_OUTPATIENT_CLINIC_OR_DEPARTMENT_OTHER): Payer: Medicare Other | Admitting: Hematology & Oncology

## 2018-02-18 VITALS — BP 115/61 | HR 77 | Temp 98.3°F | Resp 16 | Wt 156.0 lb

## 2018-02-18 DIAGNOSIS — Z5112 Encounter for antineoplastic immunotherapy: Secondary | ICD-10-CM | POA: Diagnosis not present

## 2018-02-18 DIAGNOSIS — R1010 Upper abdominal pain, unspecified: Secondary | ICD-10-CM | POA: Insufficient documentation

## 2018-02-18 DIAGNOSIS — R1030 Lower abdominal pain, unspecified: Secondary | ICD-10-CM | POA: Insufficient documentation

## 2018-02-18 DIAGNOSIS — C9110 Chronic lymphocytic leukemia of B-cell type not having achieved remission: Secondary | ICD-10-CM | POA: Insufficient documentation

## 2018-02-18 LAB — CBC WITH DIFFERENTIAL (CANCER CENTER ONLY)
BAND NEUTROPHILS: 0 %
BASOS ABS: 0 10*3/uL (ref 0.0–0.1)
BLASTS: 0 %
Basophils Relative: 0 %
EOS ABS: 0 10*3/uL (ref 0.0–0.5)
Eosinophils Relative: 0 %
HEMATOCRIT: 43.6 % (ref 34.8–46.6)
Hemoglobin: 13.5 g/dL (ref 11.6–15.9)
Lymphocytes Relative: 86 %
Lymphs Abs: 44.8 10*3/uL — ABNORMAL HIGH (ref 0.9–3.3)
MCH: 26.9 pg (ref 26.0–34.0)
MCHC: 31 g/dL — ABNORMAL LOW (ref 32.0–36.0)
MCV: 86.9 fL (ref 81.0–101.0)
MYELOCYTES: 0 %
Metamyelocytes Relative: 0 %
Monocytes Absolute: 3.7 10*3/uL — ABNORMAL HIGH (ref 0.1–0.9)
Monocytes Relative: 7 %
NEUTROS ABS: 3.7 10*3/uL (ref 1.5–6.5)
Neutrophils Relative %: 7 %
Other: 0 %
Platelet Count: 177 10*3/uL (ref 145–400)
Promyelocytes Relative: 0 %
RBC: 5.02 MIL/uL (ref 3.70–5.32)
RDW: 15.7 % (ref 11.1–15.7)
WBC: 52.2 10*3/uL — AB (ref 3.9–10.0)
nRBC: 0 /100 WBC

## 2018-02-18 LAB — CMP (CANCER CENTER ONLY)
ALK PHOS: 61 U/L (ref 26–84)
ALT: 31 U/L (ref 10–47)
AST: 37 U/L (ref 11–38)
Albumin: 3.9 g/dL (ref 3.5–5.0)
Anion gap: 8 (ref 5–15)
BUN: 11 mg/dL (ref 7–22)
CHLORIDE: 104 mmol/L (ref 98–108)
CO2: 26 mmol/L (ref 18–33)
Calcium: 10.1 mg/dL (ref 8.0–10.3)
Creatinine: 0.7 mg/dL (ref 0.60–1.20)
Glucose, Bld: 85 mg/dL (ref 73–118)
Potassium: 4.2 mmol/L (ref 3.3–4.7)
SODIUM: 138 mmol/L (ref 128–145)
Total Bilirubin: 0.8 mg/dL (ref 0.2–1.6)
Total Protein: 6.9 g/dL (ref 6.4–8.1)

## 2018-02-18 LAB — LACTATE DEHYDROGENASE: LDH: 299 U/L — ABNORMAL HIGH (ref 98–192)

## 2018-02-18 MED ORDER — ALLOPURINOL 100 MG PO TABS: 100.0000 mg | ORAL_TABLET | Freq: Every day | ORAL | 0 refills | Status: DC

## 2018-02-18 MED ORDER — IBRUTINIB 420 MG PO TABS: 420.0000 mg | ORAL_TABLET | Freq: Every day | ORAL | 12 refills | Status: DC

## 2018-02-18 NOTE — Progress Notes (Signed)
START ON PATHWAY REGIMEN - Lymphoma and CLL     A cycle is every 28 days:     Ibrutinib   **Always confirm dose/schedule in your pharmacy ordering system**  Patient Characteristics: Chronic Lymphocytic Leukemia (CLL), First Line, Treatment Indicated, Candidate for BTK Inhibitor Disease Type: Chronic Lymphocytic Leukemia (CLL) Disease Type: Not Applicable Disease Type: Not Applicable Line of therapy: First Line RAI Stage: IV Treatment Indicated<= Treatment Indicated Intent of Therapy: Curative Intent, Discussed with Patient

## 2018-02-18 NOTE — Progress Notes (Signed)
Hematology and Oncology Follow Up Visit  Morgan [name] [id number] 1965-12-09 53 y.o. 02/18/2018   Principle Diagnosis:  CLL  -- unmutated IgVH  Current Therapy:   Rituxan/Imbruvica - start cycle #1 on 02/25/2018   Interim History:  Morgan [name] is here today for follow-up.  She has had her multiple consultations for her CLL.  She is now convinced that she needs to start therapy.  I certainly have [name] problems trying to help out with this.  Recently, the big randomized clinical trial came out which showed the superiority of Rituxan/Ibrutinib versus Rituxan chemotherapy.  I think this is incredibly helpful for [place] and really puts Morgan [name]'s mind at ease that she does not need chemotherapy.  She is complaining of more swollen lymph nodes.  This is in her axilla and in her groin area.  She mostly complains of pain in the left inguinal region.  She also complains of some discomfort in the right axilla.  She has some discomfort in the right upper abdomen.  Her last set of CT scans was about 6 months ago.  I think we really need to get another set of scans so that we can see how things are looking.  She comes in with her daughter.  She had a great summer so far.  She was up in [place] with her family.  They had a great time.  Her son apparently is going over to the [place] on deployment in January.  She really wants to get treatment going and get into remission before he leaves.  She is working.  She has a lot that she has to deal with.  Again she has a lot that is going on in her life.  She is now focused and ready to start treatment.  She is had [name] fever.  She is had [name] rashes.  There is been [name] leg swelling.  She has had [name] change in bowel or bladder habits.  Overall, I said her performance status is ECOG 1.    Medications:  Allergies as of 02/18/2018      Reactions   Abilify [aripiprazole] Anaphylaxis, Swelling   Throat begins to swell    Ciprofloxacin Shortness Of Breath     Lamictal [lamotrigine] Rash   Peanuts [peanut Oil] Anaphylaxis, Swelling, Rash   Throat swelling also   Amoxicillin Hives   Doxycycline Hives   Latex Rash   Meloxicam Other (See Comments)   Induces mania   Mirapex [pramipexole] Hives   Prednisone Other (See Comments)   Interacts with Lithium. " All steroids"   Risperdal [risperidone] Hives   Tape Rash   Steri-Strips   Cortisone Other (See Comments)   Exacerbates the mania of her bipolar   Naproxen Other (See Comments)   Interacts with lithium   Percocet [oxycodone-acetaminophen] Other (See Comments)   Severe dizziness   Other Swelling, Rash   Throat swelling and rash to WALNUTS and PINE NUTS   Sulfa Antibiotics Rash      Medication List        Accurate as of 02/18/18  4:49 PM. Always use your most recent med list.          acetaminophen 500 MG tablet Commonly known as:  TYLENOL Take 500-1,000 mg by mouth every 6 (six) hours as needed (for headaches or migraines).   albuterol 108 (90 Base) MCG/ACT inhaler Commonly known as:  PROVENTIL HFA;VENTOLIN HFA Inhale 1-2 puffs into the lungs every 6 (six) hours as needed for wheezing  or shortness of breath.   allopurinol 100 MG tablet Commonly known as:  ZYLOPRIM Take 1 tablet (100 mg total) by mouth daily.   aspirin-acetaminophen-caffeine 250-250-65 MG tablet Commonly known as:  EXCEDRIN MIGRAINE Take 1-2 tablets by mouth every 6 (six) hours as needed for headache or migraine.   BREO ELLIPTA 100-25 MCG/INH Aepb Generic drug:  fluticasone furoate-vilanterol Inhale 1 puff into the lungs daily as needed ("for flares").   hyoscyamine 0.125 MG tablet Commonly known as:  LEVSIN, ANASPAZ Take 1 tablet (0.125 mg total) by mouth as needed for cramping. Reported on 09/24/2015   Ibrutinib 420 MG Tabs Commonly known as:  IMBRUVICA Take 420 mg by mouth daily.   meclizine 12.5 MG tablet Commonly known as:  ANTIVERT Take 1 tablet (12.5 mg total) by mouth 3 (three) times daily  as needed for dizziness.   metroNIDAZOLE 500 MG tablet Commonly known as:  FLAGYL Take 1 tablet (500 mg total) by mouth 3 (three) times daily.   prochlorperazine 10 MG tablet Commonly known as:  COMPAZINE Take 1 tablet (10 mg total) by mouth every 6 (six) hours as needed for nausea or vomiting.   Vitamin D3 5000 units Caps Take 5,000 Units by mouth daily.       Allergies:  Allergies  Allergen Reactions   Abilify [Aripiprazole] Anaphylaxis and Swelling    Throat begins to swell    Ciprofloxacin Shortness Of Breath   Lamictal [Lamotrigine] Rash   Peanuts [Peanut Oil] Anaphylaxis, Swelling and Rash    Throat swelling also   Amoxicillin Hives   Doxycycline Hives   Latex Rash   Meloxicam Other (See Comments)    Induces mania   Mirapex [Pramipexole] Hives   Prednisone Other (See Comments)    Interacts with Lithium. " All steroids"   Risperdal [Risperidone] Hives   Tape Rash    Steri-Strips   Cortisone Other (See Comments)    Exacerbates the mania of her bipolar   Naproxen Other (See Comments)    Interacts with lithium   Percocet [Oxycodone-Acetaminophen] Other (See Comments)    Severe dizziness   Other Swelling and Rash    Throat swelling and rash to WALNUTS and PINE NUTS   Sulfa Antibiotics Rash    Past Medical History, Surgical history, Social history, and Family History were reviewed and updated.  Review of Systems: Review of Systems  Constitutional: Positive for malaise/fatigue.  HENT: Positive for congestion.   Eyes: Negative.   Respiratory: Positive for shortness of breath.   Cardiovascular: Positive for palpitations.  Gastrointestinal: Negative.   Genitourinary: Negative.   Musculoskeletal: Positive for myalgias.  Skin: Negative.   Neurological: Positive for tingling.  Endo/Heme/Allergies: Negative.   Psychiatric/Behavioral: The patient is nervous/anxious.       Physical Exam:  weight is 156 lb (70.8 kg). Her oral temperature is  98.3 F (36.8 C). Her blood pressure is 115/61 and her pulse is 77. Her respiration is 16 and oxygen saturation is 100%.   Wt Readings from Last 3 Encounters:  02/18/18 156 lb (70.8 kg)  01/19/18 153 lb 6.4 oz (69.6 kg)  01/10/18 156 lb (70.8 kg)    Physical Exam  Constitutional: She is oriented to person, place, and time.  HENT:  Head: Normocephalic and atraumatic.  Mouth/Throat: Oropharynx is clear and moist.  Eyes: Pupils are equal, round, and reactive to light. EOM are normal.  Neck: Normal range of motion.  Cardiovascular: Normal rate, regular rhythm and normal heart sounds.  Pulmonary/Chest: Effort normal and  breath sounds normal.  She has bilateral axillary adenopathy.  Lymph nodes in the left axilla might be a little bit larger than the lymph nodes in the right axilla.  Lymph nodes are mobile and slightly firm but nontender.  Abdominal: Soft. Bowel sounds are normal.  Musculoskeletal: Normal range of motion. She exhibits [name] edema, tenderness or deformity.  Lymphadenopathy:    She has cervical adenopathy.  Neurological: She is alert and oriented to person, place, and time.  Skin: Skin is warm and dry. [name] rash noted. [name] erythema.  Psychiatric: She has a normal mood and affect. Her behavior is normal. Judgment and thought content normal.  Vitals reviewed.    Lab Results  Component Value Date   WBC 52.2 (HH) 02/18/2018   HGB 13.5 02/18/2018   HCT 43.6 02/18/2018   MCV 86.9 02/18/2018   PLT 177 02/18/2018   [name] results found for: FERRITIN, IRON, TIBC, UIBC, IRONPCTSAT Lab Results  Component Value Date   RBC 5.02 02/18/2018   [name] results found for: Nils Pyle, [facility] Lab Results  Component Value Date   IGGSERUM 611 (L) 09/08/2017   IGA 24 (L) 09/08/2017   IGMSERUM 11 (L) 09/08/2017   Lab Results  Component Value Date   TOTALPROTELP 6.5 09/08/2017   ALBUMINELP 4.3 09/08/2017   A1GS 0.2 09/08/2017   A2GS 0.7 09/08/2017   BETS 0.8 09/08/2017    GAMS 0.5 09/08/2017   MSPIKE Not Observed 09/08/2017     Chemistry      Component Value Date/Time   NA 138 02/18/2018 1054   NA 142 03/10/2017 0954   NA 142 09/04/2016 0754   K 4.2 02/18/2018 1054   K 4.2 03/10/2017 0954   K 4.0 09/04/2016 0754   CL 104 02/18/2018 1054   CL 111 (H) 03/10/2017 0954   CO2 26 02/18/2018 1054   CO2 28 03/10/2017 0954   CO2 25 09/04/2016 0754   BUN 11 02/18/2018 1054   BUN 11 03/10/2017 0954   BUN 13.4 09/04/2016 0754   CREATININE 0.70 02/18/2018 1054   CREATININE 1.1 03/10/2017 0954   CREATININE 0.8 09/04/2016 0754      Component Value Date/Time   CALCIUM 10.1 02/18/2018 1054   CALCIUM 9.6 03/10/2017 0954   CALCIUM 9.7 09/04/2016 0754   ALKPHOS 61 02/18/2018 1054   ALKPHOS 46 03/10/2017 0954   ALKPHOS 54 09/04/2016 0754   AST 37 02/18/2018 1054   AST 20 09/04/2016 0754   ALT 31 02/18/2018 1054   ALT 20 03/10/2017 0954   ALT 14 09/04/2016 0754   BILITOT 0.8 02/18/2018 1054   BILITOT 0.39 09/04/2016 0754      Impression and Plan: Ms. Correll is a very pleasant 51 yo caucasian female basically a lymph node version of CLL.    We talked for about 60 minutes.  We talked about the chemotherapy.  Again, Rituxan/ibrutinib is not chemotherapy.  In the clinical trial, the success rate was about 98%.  I gave him information about each medication.  I sent in a prescription for allopurinol to help with any potential tumor lysis.  Her white cell count has gone up quite a bit.  Her white cell count was 52,000 today.  I think this is indicative of the aggressiveness of the CLL right now.  I do not think she needs a Port-A-Cath.  Since we are just doing IV treatment really once a month, I does do not think that we need to have a Port-A-Cath put in.  We will accommodate her work schedule.  We will start her on treatment next Friday-02/25/2018.  I would like to see her back about 2-3 weeks after the start of treatment so we can see how her labs look.       Volanda Napoleon, MD 8/2/20194:49 PM

## 2018-02-18 NOTE — Telephone Encounter (Signed)
Critical Value WBC 52.2 Dr Marin Olp notified. No orders at this time.

## 2018-02-19 ENCOUNTER — Encounter (HOSPITAL_BASED_OUTPATIENT_CLINIC_OR_DEPARTMENT_OTHER): Payer: Self-pay

## 2018-02-19 ENCOUNTER — Ambulatory Visit (HOSPITAL_BASED_OUTPATIENT_CLINIC_OR_DEPARTMENT_OTHER)
Admission: RE | Admit: 2018-02-19 | Discharge: 2018-02-19 | Disposition: A | Discharge status: Home / Self Care | Payer: Medicare Other | Source: Ambulatory Visit | Attending: Hematology & Oncology | Admitting: Hematology & Oncology

## 2018-02-19 DIAGNOSIS — C9190 Lymphoid leukemia, unspecified not having achieved remission: Secondary | ICD-10-CM | POA: Diagnosis not present

## 2018-02-19 DIAGNOSIS — R590 Localized enlarged lymph nodes: Secondary | ICD-10-CM | POA: Diagnosis not present

## 2018-02-19 DIAGNOSIS — C9110 Chronic lymphocytic leukemia of B-cell type not having achieved remission: Secondary | ICD-10-CM | POA: Diagnosis not present

## 2018-02-19 LAB — BETA 2 MICROGLOBULIN, SERUM: Beta-2 Microglobulin: 3 mg/L — ABNORMAL HIGH (ref 0.6–2.4)

## 2018-02-19 MED ORDER — IOPAMIDOL (ISOVUE-300) INJECTION 61%
100.0000 mL | Freq: Once | INTRAVENOUS | Status: AC | PRN
  Administered 2018-02-19: 100 mL via INTRAVENOUS

## 2018-02-21 ENCOUNTER — Telehealth: Payer: Self-pay | Admitting: Pharmacy Technician

## 2018-02-21 ENCOUNTER — Telehealth: Payer: Self-pay | Admitting: Pharmacist

## 2018-02-21 MED FILL — IMBRUVICA 420 MG TAB: 420 | 28 days supply | Qty: 28 | Fill #0

## 2018-02-21 NOTE — Telephone Encounter (Signed)
Oral Oncology Patient Advocate Encounter  Received notification from [employer] that prior authorization for Imbruvica is required.  PA submitted on CoverMyMeds Key [id number] Status is pending  Oral Oncology Clinic will continue to follow.  Morgan [name] Phone [phone] Fax [phone] 02/21/2018 8:13 AM

## 2018-02-21 NOTE — Telephone Encounter (Signed)
Oral Chemotherapy Pharmacist Encounter  Patient Education I spoke with patient for overview of new oral chemotherapy medication: Ibrutinib for the treatment of CLL, planned duration until disease progression or unacceptable drug toxicity.   Counseled patient on administration, dosing, side effects, monitoring, drug-food interactions, safe handling, storage, and disposal. Patient will take 420 mg by mouth daily.  Side effects include but not limited to: decreased hgb/plt/wbc, fatigue, diarrhea, N/V, peripheral edema.    Reviewed with patient importance of keeping a medication schedule and plan for any missed doses.  Ms. Fodge voiced understanding and appreciation. All questions answered. Medication handout placed in the mail.  Provided patient with Oral [place] [facility] phone number. Patient knows to call the office with questions or concerns. Oral Chemotherapy Navigation Clinic will continue to follow.  Darl Pikes, PharmD, BCPS, [facility] Hematology/Oncology Clinical Pharmacist [facility] [place] [facility] [phone]  02/21/2018 2:11 PM

## 2018-02-21 NOTE — Telephone Encounter (Signed)
Oral Oncology Pharmacist Encounter  Received new prescription for Imbruvica (ibrutinib) for the treatment of CLL in conjunction with rituximab, planned duration until disease progression or unacceptable drug toxicity.  CBC/CMP from 02/18/18 assessed, no relevant lab abnormalities. Prescription dose and frequency assessed.   Current medication list in Epic reviewed, no relevant DDIs with ibrutinib identified:  Prescription has been e-scribed to the [facility] for benefits analysis and approval.  Oral Oncology Clinic will continue to follow for insurance authorization, copayment issues, initial counseling and start date.  Darl Pikes, PharmD, BCPS, [facility] Hematology/Oncology Clinical Pharmacist [facility] [place] [facility] [phone]  02/21/2018 1:50 PM

## 2018-02-21 NOTE — Telephone Encounter (Signed)
Oral Oncology Patient Advocate Encounter  Prior Authorization for Imbruvica (Ibrutinib) has been approved.    PA# [id number] Effective dates: 02/21/18 through 07/19/18  Oral Oncology Clinic will continue to follow.   [place] Patient [place] Phone [phone] Fax [phone] 02/21/2018 8:17 AM

## 2018-02-22 ENCOUNTER — Telehealth: Payer: Self-pay | Admitting: *Deleted

## 2018-02-22 NOTE — Telephone Encounter (Addendum)
Patient is aware of results.   ----- Message from Volanda Napoleon, MD sent at 02/19/2018 12:12 PM EDT ----- Call - nodes are large, bur have NOT grown in 5 months!!  pete

## 2018-02-25 ENCOUNTER — Other Ambulatory Visit: Payer: Self-pay | Admitting: *Deleted

## 2018-02-25 ENCOUNTER — Inpatient Hospital Stay: Payer: Medicare Other

## 2018-02-25 VITALS — BP 111/58 | HR 70 | Temp 98.4°F | Resp 17

## 2018-02-25 DIAGNOSIS — Z5181 Encounter for therapeutic drug level monitoring: Secondary | ICD-10-CM

## 2018-02-25 DIAGNOSIS — F419 Anxiety disorder, unspecified: Secondary | ICD-10-CM

## 2018-02-25 DIAGNOSIS — Z79620 Long term (current) use of immunosuppressive biologic: Secondary | ICD-10-CM

## 2018-02-25 DIAGNOSIS — R1030 Lower abdominal pain, unspecified: Secondary | ICD-10-CM | POA: Diagnosis not present

## 2018-02-25 DIAGNOSIS — Z5112 Encounter for antineoplastic immunotherapy: Secondary | ICD-10-CM | POA: Diagnosis not present

## 2018-02-25 DIAGNOSIS — C9110 Chronic lymphocytic leukemia of B-cell type not having achieved remission: Secondary | ICD-10-CM

## 2018-02-25 DIAGNOSIS — Z79899 Other long term (current) drug therapy: Secondary | ICD-10-CM

## 2018-02-25 DIAGNOSIS — R1010 Upper abdominal pain, unspecified: Secondary | ICD-10-CM | POA: Diagnosis not present

## 2018-02-25 MED ORDER — FAMOTIDINE IN NACL 20-0.9 MG/50ML-% IV SOLN
20.0000 mg | Freq: Once | INTRAVENOUS | Status: AC | PRN
  Administered 2018-02-25: 20 mg via INTRAVENOUS

## 2018-02-25 MED ORDER — METHYLPREDNISOLONE SODIUM SUCC 125 MG IJ SOLR
125.0000 mg | Freq: Once | INTRAMUSCULAR | Status: AC | PRN
  Administered 2018-02-25: 125 mg via INTRAVENOUS

## 2018-02-25 MED ORDER — DIPHENHYDRAMINE HCL 25 MG PO CAPS
ORAL_CAPSULE | ORAL | Status: AC
  Filled 2018-02-25: qty 2

## 2018-02-25 MED ORDER — LORAZEPAM 2 MG/ML IJ SOLN
0.5000 mg | Freq: Once | INTRAMUSCULAR | Status: AC
  Administered 2018-02-25: 0.5 mg via INTRAVENOUS

## 2018-02-25 MED ORDER — DIPHENHYDRAMINE HCL 25 MG PO CAPS
50.0000 mg | ORAL_CAPSULE | Freq: Once | ORAL | Status: AC
  Administered 2018-02-25: 50 mg via ORAL

## 2018-02-25 MED ORDER — ACETAMINOPHEN 325 MG PO TABS
650.0000 mg | ORAL_TABLET | Freq: Once | ORAL | Status: AC
  Administered 2018-02-25: 650 mg via ORAL

## 2018-02-25 MED ORDER — SODIUM CHLORIDE 0.9 % IV SOLN
Freq: Once | INTRAVENOUS | Status: AC
Start: 2018-02-25 — End: 2018-02-25
  Administered 2018-02-25: 09:00:00 via INTRAVENOUS
  Filled 2018-02-25: qty 250

## 2018-02-25 MED ORDER — SODIUM CHLORIDE 0.9 % IV SOLN
Freq: Once | INTRAVENOUS | Status: AC | PRN
  Administered 2018-02-25: 11:00:00 via INTRAVENOUS
  Filled 2018-02-25: qty 250

## 2018-02-25 MED ORDER — ACETAMINOPHEN 325 MG PO TABS
ORAL_TABLET | ORAL | Status: AC
  Filled 2018-02-25: qty 2

## 2018-02-25 MED ORDER — SODIUM CHLORIDE 0.9 % IV SOLN
375.0000 mg/m2 | Freq: Once | INTRAVENOUS | Status: AC
  Administered 2018-02-25: 700 mg via INTRAVENOUS
  Filled 2018-02-25: qty 50

## 2018-02-25 MED ORDER — LORAZEPAM 2 MG/ML IJ SOLN
INTRAMUSCULAR | Status: AC
  Filled 2018-02-25: qty 1

## 2018-02-25 NOTE — Progress Notes (Signed)
Orders placed for hepatitis B panel

## 2018-02-25 NOTE — Patient Instructions (Signed)
[facility] Discharge Instructions for Patients Receiving Chemotherapy  Today you received the following chemotherapy agents Rituxan  To help prevent nausea and vomiting after your treatment, we encourage you to take your nausea medication as prescribed by MD.    If you develop nausea and vomiting that is not controlled by your nausea medication, call the clinic.   BELOW ARE SYMPTOMS THAT SHOULD BE REPORTED IMMEDIATELY:  *FEVER GREATER THAN 100.5 F  *CHILLS WITH OR WITHOUT FEVER  NAUSEA AND VOMITING THAT IS NOT CONTROLLED WITH YOUR NAUSEA MEDICATION  *UNUSUAL SHORTNESS OF BREATH  *UNUSUAL BRUISING OR BLEEDING  TENDERNESS IN MOUTH AND THROAT WITH OR WITHOUT PRESENCE OF ULCERS  *URINARY PROBLEMS  *BOWEL PROBLEMS  UNUSUAL RASH Items with * indicate a potential emergency and should be followed up as soon as possible.  Feel free to call the clinic should you have any questions or concerns. The clinic phone number is [phone].  Please show the [place] at check-in to the Emergency Department and triage nurse.  Rituximab injection What is this medicine? RITUXIMAB (ri TUX i mab) is a monoclonal antibody. It is used to treat certain types of cancer like non-Hodgkin lymphoma and chronic lymphocytic leukemia. It is also used to treat rheumatoid arthritis, granulomatosis with polyangiitis (or Wegener's granulomatosis), and microscopic polyangiitis. This medicine may be used for other purposes; ask your health care provider or pharmacist if you have questions. COMMON BRAND NAME(S): Rituxan What should I tell my health care provider before I take this medicine? They need to know if you have any of these conditions: -heart disease -infection (especially a virus infection such as hepatitis B, chickenpox, cold sores, or herpes) -immune system problems -irregular heartbeat -kidney disease -lung or breathing disease, like asthma -recently received or scheduled  to receive a vaccine -an unusual or allergic reaction to rituximab, mouse proteins, other medicines, foods, dyes, or preservatives -pregnant or trying to get pregnant -breast-feeding How should I use this medicine? This medicine is for infusion into a vein. It is administered in a hospital or clinic by a specially trained health care professional. A special MedGuide will be given to you by the pharmacist with each prescription and refill. Be sure to read this information carefully each time. Talk to your pediatrician regarding the use of this medicine in children. This medicine is not approved for use in children. Overdosage: If you think you have taken too much of this medicine contact a poison control center or emergency room at once. NOTE: This medicine is only for you. Do not share this medicine with others. What if I miss a dose? It is important not to miss a dose. Call your doctor or health care professional if you are unable to keep an appointment. What may interact with this medicine? -cisplatin -other medicines for arthritis like disease modifying antirheumatic drugs or tumor necrosis factor inhibitors -live virus vaccines This list may not describe all possible interactions. Give your health care provider a list of all the medicines, herbs, non-prescription drugs, or dietary supplements you use. Also tell them if you smoke, drink alcohol, or use illegal drugs. Some items may interact with your medicine. What should I watch for while using this medicine? Your condition will be monitored carefully while you are receiving this medicine. You may need blood work done while you are taking this medicine. This medicine can cause serious allergic reactions. To reduce your risk you may need to take medicine before treatment with this medicine.  Take your medicine as directed. In some patients, this medicine may cause a serious brain infection that may cause death. If you have any problems seeing,  thinking, speaking, walking, or standing, tell your doctor right away. If you cannot reach your doctor, urgently seek other source of medical care. Call your doctor or health care professional for advice if you get a fever, chills or sore throat, or other symptoms of a cold or flu. Do not treat yourself. This drug decreases your body's ability to fight infections. Try to avoid being around people who are sick. Do not become pregnant while taking this medicine or for 12 months after stopping it. Women should inform their doctor if they wish to become pregnant or think they might be pregnant. There is a potential for serious side effects to an unborn child. Talk to your health care professional or pharmacist for more information. What side effects may I notice from receiving this medicine? Side effects that you should report to your doctor or health care professional as soon as possible: -breathing problems -chest pain -dizziness or feeling faint -fast, irregular heartbeat -low blood counts - this medicine may decrease the number of white blood cells, red blood cells and platelets. You may be at increased risk for infections and bleeding. -mouth sores -redness, blistering, peeling or loosening of the skin, including inside the mouth (this can be added for any serious or exfoliative rash that could lead to hospitalization) -signs of infection - fever or chills, cough, sore throat, pain or difficulty passing urine -signs and symptoms of kidney injury like trouble passing urine or change in the amount of urine -signs and symptoms of liver injury like dark yellow or brown urine; general ill feeling or flu-like symptoms; light-colored stools; loss of appetite; nausea; right upper belly pain; unusually weak or tired; yellowing of the eyes or skin -stomach pain -vomiting Side effects that usually do not require medical attention (report to your doctor or health care professional if they continue or are  bothersome): -headache -joint pain -muscle cramps or muscle pain This list may not describe all possible side effects. Call your doctor for medical advice about side effects. You may report side effects to FDA at 1-800-FDA-1088. Where should I keep my medicine? This drug is given in a hospital or clinic and will not be stored at home. NOTE: This sheet is a summary. It may not cover all possible information. If you have questions about this medicine, talk to your doctor, pharmacist, or health care provider.  2018 Elsevier/Gold Standard (2016-02-12 15:28:09)

## 2018-02-25 NOTE — Progress Notes (Signed)
1040: Pt c/o tingling around her lips and back of throat. Rituxan infusion stopped at this time; refer to [facility]. Normal saline infusion started. Judson Roch, NP at bedside to evaluate pt. Pt medicated per orders; refer to [facility]. Pt respirations equal and unlabored during this time. Skin warm and dry. Pt denies any other complaints. Pt denies any pain. Pt states "I react to medicines differently than other people" Vitals stable; refer to flowsheet.

## 2018-02-26 LAB — HEPATITIS PANEL, ACUTE
HEP A IGM: NEGATIVE
HEP B C IGM: NEGATIVE
Hepatitis B Surface Ag: NEGATIVE

## 2018-02-28 ENCOUNTER — Encounter: Payer: Self-pay | Admitting: *Deleted

## 2018-02-28 NOTE — Progress Notes (Signed)
[place] Clinical Social Worker  Clinical Social Worker received phone call from patient requesting information on support and resources.  Patient recently started treatment for advanced CLL and expressed interest in exploring support services and resources.  CSW and patient discussed the support team and support services at [facility].   CSW educated patient on support groups and counseling options available.  CSW and patient also discussed common feelings and emotions when going through treatment, and the importance of support.  CSW mailed patient a packet with patient and family support calendar and additional information on support groups and programs.  CSW provided contact information, and patient plans to contact CSW when she is ready to schedule an appointment.    Johnnye Lana, MSW, LCSW, OSW-C Clinical Social Worker [facility] [phone]

## 2018-03-01 ENCOUNTER — Telehealth: Payer: Self-pay | Admitting: *Deleted

## 2018-03-01 ENCOUNTER — Other Ambulatory Visit: Payer: Self-pay | Admitting: *Deleted

## 2018-03-01 NOTE — Progress Notes (Signed)
Cardiology Office Note:    Date:  03/02/2018   ID:  Morgan [name], DOB 01-30-1966, MRN [id number]  PCP:  Darreld Mclean, MD  Cardiologist:  Shirlee More, MD   Referring MD: Mackie Pai, PA-C  ASSESSMENT:    1. Palpitation   2. Pre-syncope   3. Chronic lymphocytic leukemia ([place])    PLAN:    In order of problems listed above:  1. We need in some fashion to define if she is having sinus tachycardia or atrial arrhythmia MI advised her to purchase the kardia band to utilize her smart watch to document arrhythmia and contact me when she has strips so I can review make a decision regarding medical therapy or EP consultation if she does not atrial arrhythmia documented.  In the interim we will request records from [place] where she had a previous echocardiogram stress test and a heart monitor.  Rest of her knowledge she has no documented arrhythmia 2. See above we could utilize a event monitor she prefers to buy the adapter for her smart as to document episodes of palpitations associated with arrhythmia 3. Feel that her CLL in his treatment is unrelated to her cardiovascular symptoms I did a review of the medication.  Next appointment months or sooner if we document arrhythmia   Medication Adjustments/Labs and Tests Ordered: Current medicines are reviewed at length with the patient today.  Concerns regarding medicines are outlined above.  Orders Placed This Encounter  Procedures  . TSH + free T4  . T3, free  . TSH  . EKG 12-Lead   No orders of the defined types were placed in this encounter.    Chief Complaint  Patient presents with  . Palpitations  . Congestive Heart Failure    History of Present Illness:    Morgan [name] is a 52 y.o. female with CLLwho is being seen today for the evaluation of palpitation at the request of [place], Percell Miller, [place].  Is a background history of palpitation and near syncope and 5 years ago was seen in [facility] and had  stress test echocardiogram event monitor were unaware if any documented arrhythmia.  She told me she has a problem commonly seen in young women 20% but does not know what it is.  He has had a history of and is having more frequent episodes of her heart racing associated with lightheadedness and one episode of church of near syncope.  An episode she is asymptomatic they last for up to 5 minutes and when she had the episode at church her heart rate was in the range of 125 bpm.  Had syncope no family history of cardiomyopathy or sudden cardiac death takes no over-the-counter proarrhythmic drugs and no history of congenital or rheumatic heart disease Past Medical History:  Diagnosis Date  . Anxiety   . Arthritis   . Asthma   . Bipolar disorder ([place])   . Bulging lumbar disc 07/19/05  . Chronic headaches   . Chronic lymphocytic leukemia ([place]) 07/31/2016  . Colon polyps   . Degenerative disorder of bone   . Depression   . History of borderline personality disorder   . Hypothyroidism 2007   Developed after use of Lithium  . IBS (irritable bowel syndrome) 1995  . Pneumonia   . Proctitis   . Substance abuse ([place])    ativan last month    Past Surgical History:  Procedure Laterality Date  . ABDOMINAL HYSTERECTOMY    . APPENDECTOMY    .  BUNIONECTOMY Right 06/2012  . CHOLECYSTECTOMY    . OOPHORECTOMY    . SHOULDER OPEN ROTATOR CUFF REPAIR Right 03/2010  . TUBAL LIGATION      Current Medications: Current Meds  Medication Sig  . acetaminophen (TYLENOL) 500 MG tablet Take 500-1,000 mg by mouth every 6 (six) hours as needed (for headaches or migraines).  Marland Kitchen albuterol (PROVENTIL HFA;VENTOLIN HFA) 108 (90 Base) MCG/ACT inhaler Inhale 1-2 puffs into the lungs every 6 (six) hours as needed for wheezing or shortness of breath.  . allopurinol (ZYLOPRIM) 100 MG tablet Take 1 tablet (100 mg total) by mouth daily.  . fluticasone furoate-vilanterol (BREO ELLIPTA) 100-25 MCG/INH AEPB Inhale 1 puff into the  lungs daily as needed ("for flares").   . hyoscyamine (LEVSIN, ANASPAZ) 0.125 MG tablet Take 1 tablet (0.125 mg total) by mouth as needed for cramping. Reported on 09/24/2015  . Ibrutinib (IMBRUVICA) 420 MG TABS Take 420 mg by mouth daily.  . prochlorperazine (COMPAZINE) 10 MG tablet Take 1 tablet (10 mg total) by mouth every 6 (six) hours as needed for nausea or vomiting.     Allergies:   Abilify [aripiprazole]; Ciprofloxacin; Lamictal [lamotrigine]; Peanuts [peanut oil]; Amoxicillin; Doxycycline; Latex; Meloxicam; Mirapex [pramipexole]; Prednisone; Risperdal [risperidone]; Tape; Cortisone; Naproxen; Percocet [oxycodone-acetaminophen]; Other; and Sulfa antibiotics   Social History   Socioeconomic History  . Marital status: Divorced    Spouse name: Not on file  . Number of children: 2  . Years of education: 37  . Highest education level: Not on file  Occupational History  . Occupation: Unemployed  Social Needs  . Financial resource strain: Not on file  . Food insecurity:    Worry: Not on file    Inability: Not on file  . Transportation needs:    Medical: Not on file    Non-medical: Not on file  Tobacco Use  . Smoking status: Former Smoker    Last attempt to quit: 07/20/1980    Years since quitting: 37.6  . Smokeless tobacco: Never Used  Substance and Sexual Activity  . Alcohol use: No    Alcohol/week: 0.0 standard drinks    Comment: unsure  . Drug use: Yes    Types: Benzodiazepines  . Sexual activity: Never    Birth control/protection: Surgical    Comment: intercourse age unknown,sexual partners less than 5  Lifestyle  . Physical activity:    Days per week: Not on file    Minutes per session: Not on file  . Stress: Not on file  Relationships  . Social connections:    Talks on phone: Not on file    Gets together: Not on file    Attends religious service: Not on file    Active member of club or organization: Not on file    Attends meetings of clubs or organizations: Not  on file    Relationship status: Not on file  Other Topics Concern  . Not on file  Social History Narrative   Fun: [place], travel, music, writing, walking, hiking, volunteering   Denies religious beliefs effecting health care.    Feels safe at home.      Family History: The patient's family history includes Alcohol abuse in her brother and paternal grandfather; Alzheimer's disease in her father and paternal grandmother; Arthritis in her paternal grandmother; COPD in her maternal grandmother; Coronary artery disease in her paternal grandfather; Depression in her mother and paternal grandmother; Diabetes in her paternal grandmother; Drug abuse in her brother; Heart disease in her maternal  grandfather and paternal grandfather; Hypertension in her mother; Post-traumatic stress disorder in her brother and sister; Thyroid disease in her father. There is no history of Colon cancer or Breast cancer.  ROS:   Review of Systems  Constitution: Negative.  HENT: Negative.   Eyes: Negative.   Cardiovascular: Positive for chest pain, leg swelling and orthopnea.  Respiratory: Negative.   Endocrine: Negative.   Hematologic/Lymphatic: Negative.   Skin: Negative.   Musculoskeletal: Negative.   Gastrointestinal: Negative.   Genitourinary: Negative.   Neurological: Negative.   Psychiatric/Behavioral: Negative.   Allergic/Immunologic: Negative.    Please see the history of present illness.     All other systems reviewed and are negative.  EKGs/Labs/Other Studies Reviewed:    The following studies were reviewed today:   EKG:  EKG is  ordered today.  The ekg ordered today demonstrates sinus rhythm normal EKG 01/19/18 [facility] normal Recent Labs: 02/18/2018: ALT 31; BUN 11; Creatinine 0.70; Hemoglobin 13.5; Platelet Count 177; Potassium 4.2; Sodium 138  Recent Lipid Panel No results found for: CHOL, TRIG, HDL, CHOLHDL, VLDL, LDLCALC, LDLDIRECT  Physical Exam:    VS:  BP 128/62 (BP Location: Left Arm)    Pulse 76   Ht 5' 3.5" (1.613 m)   Wt 154 lb 6.4 oz (70 kg)   SpO2 98%   BMI 26.92 kg/m     Wt Readings from Last 3 Encounters:  03/02/18 154 lb 6.4 oz (70 kg)  02/18/18 156 lb (70.8 kg)  01/19/18 153 lb 6.4 oz (69.6 kg)     GEN:  Well nourished, well developed in no acute distress HEENT: Normal NECK: No JVD; No carotid bruits LYMPHATICS: No lymphadenopathy CARDIAC: RRR, no murmurs, rubs, gallops RESPIRATORY:  Clear to auscultation without rales, wheezing or rhonchi  ABDOMEN: Soft, non-tender, non-distended MUSCULOSKELETAL:  No edema; No deformity  SKIN: Warm and dry NEUROLOGIC:  Alert and oriented x 3 PSYCHIATRIC:  Normal affect     Signed, Shirlee More, MD  03/02/2018 5:49 PM    [facility]

## 2018-03-01 NOTE — Telephone Encounter (Signed)
Message received from patient stating that she is having "pain in right side of abdomen" and fatigue.  Call placed back to patient and pt states that pain is not a new pain and that pain has slightly subsided at this time, she denies any fever, states that nausea is under control with the ginger she is taking, has slight SOB-which is not new, denies cough, she is eating and drinking without difficulty and that she has stayed home from work today to rest.  Pt instructed to continue to rest today as needed, continue drinking fluids with a max goal of 64 oz of water a day and to notify office with any fever or increase in pain.  Patient appreciative of call and has no further questions or concerns at this time.

## 2018-03-02 ENCOUNTER — Ambulatory Visit: Payer: Medicare Other | Admitting: Cardiology

## 2018-03-02 ENCOUNTER — Encounter: Payer: Self-pay | Admitting: Cardiology

## 2018-03-02 ENCOUNTER — Encounter: Payer: Self-pay | Admitting: Hematology & Oncology

## 2018-03-02 VITALS — BP 128/62 | HR 76 | Ht 63.5 in | Wt 154.4 lb

## 2018-03-02 DIAGNOSIS — E038 Other specified hypothyroidism: Secondary | ICD-10-CM | POA: Diagnosis not present

## 2018-03-02 DIAGNOSIS — R55 Syncope and collapse: Secondary | ICD-10-CM | POA: Diagnosis not present

## 2018-03-02 DIAGNOSIS — C9190 Lymphoid leukemia, unspecified not having achieved remission: Secondary | ICD-10-CM | POA: Diagnosis not present

## 2018-03-02 DIAGNOSIS — R002 Palpitations: Secondary | ICD-10-CM

## 2018-03-02 DIAGNOSIS — C9110 Chronic lymphocytic leukemia of B-cell type not having achieved remission: Secondary | ICD-10-CM

## 2018-03-02 NOTE — Patient Instructions (Addendum)
Medication Instructions:  Your physician recommends that you continue on your current medications as directed. Please refer to the Current Medication list given to you today.  Labwork: Your physician recommends that you return for lab work today: TSH.   Testing/Procedures: You had an EKG today.   Follow-Up: Your physician wants you to follow-up in: 3 months. You will receive a reminder letter in the mail two months in advance. If you don't receive a letter, please call our office to schedule the follow-up appointment.   If you need a refill on your cardiac medications before your next appointment, please call your pharmacy.   Thank you for choosing [facility]! Robyne Peers, RN [phone]             Introducing Vladimir Faster, the new wearable EKG by [employer]. Vladimir Faster replaces your original Apple Watch band. The first of its kind, FDA-cleared KardiaBand provides accurate and instant analysis for detecting atrial fibrillation (AF) and normal sinus rhythm in an EKG. Simply place your thumb on the integrated KardiaBand sensor to take a medical-grade EKG in just 30 seconds. Results appear instantly on your Apple Watch. Vladimir Faster is available today for just $199. KardiaBand features are designed exclusively for use with [employer]  - $99 year. The [employer] for [employer] includes [employer]'s revolutionary SmartRhythm monitoring feature. SmartRhythm monitoring uses an intelligent neural network that runs directly on the [employer], constantly acquiring data from the watch's heart rate sensor and its accelerometer. SmartRhythm compares your heart rate to what it expects from your minute-by-minute level of activity. When the network sees a pattern of heart rate and activity that it does not expect, it notifies you to take an EKG. With [place], peace of mind is just an EKG away.  .   1. Avoid all over-the-counter antihistamines except  Claritin/Loratadine and Zyrtec/Cetrizine. 2. Avoid all combination including cold sinus allergies flu decongestant and sleep medications 3. You can use Robitussin DM Mucinex and Mucinex DM for cough. 4. can use Tylenol aspirin ibuprofen and naproxen but no combinations such as sleep or sinus.    Thyroid-Stimulating Hormone Test Why am I having this test? A thyroid-stimulating hormone (TSH) test is a blood test that is done to measure the level of TSH, also known as thyrotropin, in your blood. TSH is produced by the pituitary gland. The pituitary gland is a small organ located just below the brain, behind your eyes and nasal passages. It is part of a system that monitors and maintains thyroid hormone levels and thyroid gland function. Thyroid hormones affect many body parts and systems, including the system that affects how quickly your body burns fuel for energy. Your health care provider may recommend testing your TSH level if you have signs and symptoms of abnormal thyroid hormone levels. Knowing the level of TSH in your blood can help your health care provider:  Diagnose a thyroid gland or pituitary gland disorder.  Manage your condition and treatment if you have hypothyroidism or hyperthyroidism.  What kind of sample is taken? A blood sample is required for this test. It is usually collected by inserting a needle into a vein. How do I prepare for this test? There is no preparation required for this test. What are the reference ranges? Reference rangesare considered healthy rangesestablished after testing a large group of healthy people. Reference rangesmay vary among different people, labs, and hospitals. It is your responsibility to obtain your test results. Ask the lab or department performing the test when and  how you will get your results. Range of Normal Values:  Adult: 0.3-5 microunits/mL or 0.3-5 milliunits/L (SI units).  Newborn: 48-18 microunits/mL or 3-18  milliunits/L.  Cord: 3-12 microunits/mL or 3-12 milliunits/L.  What do the results mean? A high level of TSH may mean:  Your thyroid gland is not making enough thyroid hormones. When the thyroid gland does not make enough thyroid hormones, the pituitary gland releases TSH into the bloodstream. The higher-than-normal levels of TSH prompt the thyroid gland to release more thyroid hormones.  You are getting an insufficient level of thyroid hormone medicine, if you are receiving this type of treatment.  There is a problem with the pituitary gland (rare).  A low level of TSH can indicate a problem with the pituitary gland. Talk with your health care provider to discuss your results, treatment options, and if necessary, the need for more tests. Talk with your health care provider if you have any questions about your results. Talk with your health care provider to discuss your results, treatment options, and if necessary, the need for more tests. Talk with your health care provider if you have any questions about your results. This information is not intended to replace advice given to you by your health care provider. Make sure you discuss any questions you have with your health care provider. Document Released: 07/31/2004 Document Revised: 03/08/2016 Document Reviewed: 11/29/2013 Elsevier Interactive Patient Education  [employer].

## 2018-03-03 ENCOUNTER — Other Ambulatory Visit: Payer: Self-pay

## 2018-03-03 ENCOUNTER — Ambulatory Visit: Payer: Self-pay | Admitting: Hematology & Oncology

## 2018-03-03 LAB — TSH: TSH: 6.73 u[IU]/mL — AB (ref 0.450–4.500)

## 2018-03-04 ENCOUNTER — Encounter: Payer: Self-pay | Admitting: General Practice

## 2018-03-04 ENCOUNTER — Encounter: Payer: Self-pay | Admitting: Family Medicine

## 2018-03-04 NOTE — Progress Notes (Signed)
[facility] Spiritual Care Note  Received lengthy call from [facility] yesterday per referral from [facility]/LCSW. Morgan [name] used the opportunity well to share and process her distress about CLL/SLL dx and tx, how her faith and spirituality help her cope and make meaning, how she is supporting her two children (son, 35; dtr, 46) through this, and how she is caring for her own mental health in the midst of so much stress. Per pt, she has one close friend who "gets" cancer (friend has aggressive breast ca and lives in [place]), which has been very helpful. Morgan [name] is also a source of support. Morgan [name] is cautious about supporting her children without leaning too hard on them for support, particularly because her son will soon depart for a military tour in the [place] practices prayer, Scripture reading, regular walks, and healthy eating to help her physical and mental health.  Phone call was limited by pt's feeling sick and needing to lie down. We plan to f/u by phone next week to evaluate needs and next steps for support.   [place], [place], [facility] Pager [phone] Voicemail [phone]

## 2018-03-06 ENCOUNTER — Encounter: Payer: Self-pay | Admitting: Hematology & Oncology

## 2018-03-09 ENCOUNTER — Inpatient Hospital Stay (HOSPITAL_BASED_OUTPATIENT_CLINIC_OR_DEPARTMENT_OTHER): Payer: Medicare Other | Admitting: Hematology & Oncology

## 2018-03-09 ENCOUNTER — Encounter: Payer: Self-pay | Admitting: General Practice

## 2018-03-09 ENCOUNTER — Encounter: Payer: Self-pay | Admitting: Hematology & Oncology

## 2018-03-09 ENCOUNTER — Other Ambulatory Visit: Payer: Self-pay

## 2018-03-09 ENCOUNTER — Inpatient Hospital Stay: Payer: Medicare Other

## 2018-03-09 VITALS — BP 122/60 | HR 62 | Temp 98.0°F | Resp 20 | Wt 152.8 lb

## 2018-03-09 DIAGNOSIS — C9110 Chronic lymphocytic leukemia of B-cell type not having achieved remission: Secondary | ICD-10-CM | POA: Diagnosis not present

## 2018-03-09 DIAGNOSIS — R1010 Upper abdominal pain, unspecified: Secondary | ICD-10-CM | POA: Diagnosis not present

## 2018-03-09 DIAGNOSIS — Z5112 Encounter for antineoplastic immunotherapy: Secondary | ICD-10-CM | POA: Diagnosis not present

## 2018-03-09 DIAGNOSIS — R1030 Lower abdominal pain, unspecified: Secondary | ICD-10-CM | POA: Diagnosis not present

## 2018-03-09 LAB — CBC WITH DIFFERENTIAL (CANCER CENTER ONLY)
BASOS ABS: 0.1 10*3/uL (ref 0.0–0.1)
BASOS PCT: 1 %
EOS PCT: 2 %
Eosinophils Absolute: 0.2 10*3/uL (ref 0.0–0.5)
HEMATOCRIT: 41.6 % (ref 34.8–46.6)
Hemoglobin: 13.3 g/dL (ref 11.6–15.9)
LYMPHS PCT: 77 %
Lymphs Abs: 8.5 10*3/uL — ABNORMAL HIGH (ref 0.9–3.3)
MCH: 27.5 pg (ref 26.0–34.0)
MCHC: 32 g/dL (ref 32.0–36.0)
MCV: 86.1 fL (ref 81.0–101.0)
Monocytes Absolute: 0.5 10*3/uL (ref 0.1–0.9)
Monocytes Relative: 4 %
Neutro Abs: 1.8 10*3/uL (ref 1.5–6.5)
Neutrophils Relative %: 16 %
PLATELETS: 156 10*3/uL (ref 145–400)
RBC: 4.83 MIL/uL (ref 3.70–5.32)
RDW: 14.5 % (ref 11.1–15.7)
WBC: 11 10*3/uL — AB (ref 3.9–10.0)

## 2018-03-09 LAB — CMP (CANCER CENTER ONLY)
ALBUMIN: 4 g/dL (ref 3.5–5.0)
ALT: 15 U/L (ref 10–47)
ANION GAP: 2 — AB (ref 5–15)
AST: 23 U/L (ref 11–38)
Alkaline Phosphatase: 40 U/L (ref 26–84)
BUN: 10 mg/dL (ref 7–22)
CALCIUM: 9.6 mg/dL (ref 8.0–10.3)
CO2: 31 mmol/L (ref 18–33)
CREATININE: 0.9 mg/dL (ref 0.60–1.20)
Chloride: 104 mmol/L (ref 98–108)
GLUCOSE: 78 mg/dL (ref 73–118)
Potassium: 4.1 mmol/L (ref 3.3–4.7)
Sodium: 137 mmol/L (ref 128–145)
TOTAL PROTEIN: 6.5 g/dL (ref 6.4–8.1)
Total Bilirubin: 1.1 mg/dL (ref 0.2–1.6)

## 2018-03-09 LAB — LACTATE DEHYDROGENASE: LDH: 195 U/L — ABNORMAL HIGH (ref 98–192)

## 2018-03-09 LAB — URIC ACID: Uric Acid, Serum: 4.9 mg/dL (ref 2.5–7.1)

## 2018-03-09 NOTE — Progress Notes (Signed)
Hematology and Oncology Follow Up Visit  Morgan [name] [id number] 1966-04-16 52 y.o. 03/09/2018   Principle Diagnosis:  CLL  -- unmutated IgVH  Current Therapy:   Rituxan/Imbruvica - start cycle #1 on 02/25/2018   Interim History:  Morgan [name] is here today for follow-up.  He tolerated her first cycle of Rituxan pretty well.  As expected, she has had quite a few complaints.  Of still not sure if any of these complaints are reflective of the Rituxan.  She is on [place].  Her lymphadenopathy has resolved already.  Her white cell count is come down quite quickly.  Her main complaint has been a sore throat.  I looked back into her throat.  I do not see anything that looked suspicious.  There is no erythema.  There is no white plaques.  I saw no mucositis.  She said that she has had problems since before treatment.  She of course, she never seen ENT.  We will have to make a referral for her.  She is still working.  She is working part-time.  I told her that she does not need to wear a mask because her immune system is not that compromised.  She is having this abdominal pain.  Again I am not sure what is going on with abdominal pain.  I would not think that this is anything related to her CLL.  Again she is responding as a lymphadenopathy in her neck is gone.  The lymphadenopathy in her axilla is gone.  I would think that any lymphadenopathy in her abdomen is also resolving.  She is had no fever.  She is had no bleeding.  She is eating okay.  Overall, her performance status is ECOG 1.  Medications:  Allergies as of 03/09/2018      Reactions   Abilify [aripiprazole] Anaphylaxis, Swelling   Throat begins to swell    Ciprofloxacin Shortness Of Breath   Lamictal [lamotrigine] Rash   Peanuts [peanut Oil] Anaphylaxis, Swelling, Rash   Throat swelling also   Amoxicillin Hives   Doxycycline Hives   Latex Rash   Meloxicam Other (See Comments)   Induces mania   Mirapex [pramipexole] Hives     Prednisone Other (See Comments)   Interacts with Lithium. " All steroids"   Risperdal [risperidone] Hives   Tape Rash   Steri-StripsT   Cortisone Other (See Comments)   Exacerbates the mania of her bipolar   Naproxen Other (See Comments)   Interacts with lithium   Percocet [oxycodone-acetaminophen] Other (See Comments)   Severe dizziness   Other Swelling, Rash   Throat swelling and rash to WALNUTS and PINE NUTS   Sulfa Antibiotics Rash      Medication List        Accurate as of 03/09/18  3:42 PM. Always use your most recent med list.          acetaminophen 500 MG tablet Commonly known as:  TYLENOL Take 500-1,000 mg by mouth every 6 (six) hours as needed (for headaches or migraines).   albuterol 108 (90 Base) MCG/ACT inhaler Commonly known as:  PROVENTIL HFA;VENTOLIN HFA Inhale 1-2 puffs into the lungs every 6 (six) hours as needed for wheezing or shortness of breath.   allopurinol 100 MG tablet Commonly known as:  ZYLOPRIM Take 1 tablet (100 mg total) by mouth daily.   BREO ELLIPTA 100-25 MCG/INH Aepb Generic drug:  fluticasone furoate-vilanterol Inhale 1 puff into the lungs daily as needed ("for flares").   hyoscyamine 0.125  MG tablet Commonly known as:  LEVSIN, ANASPAZ Take 1 tablet (0.125 mg total) by mouth as needed for cramping. Reported on 09/24/2015   Ibrutinib 420 MG Tabs Take 420 mg by mouth daily.   prochlorperazine 10 MG tablet Commonly known as:  COMPAZINE Take 1 tablet (10 mg total) by mouth every 6 (six) hours as needed for nausea or vomiting.   VITAMIN D2 PO Take by mouth daily.       Allergies:  Allergies  Allergen Reactions  . Abilify [Aripiprazole] Anaphylaxis and Swelling    Throat begins to swell   . Ciprofloxacin Shortness Of Breath  . Lamictal [Lamotrigine] Rash  . Peanuts [Peanut Oil] Anaphylaxis, Swelling and Rash    Throat swelling also  . Amoxicillin Hives  . Doxycycline Hives  . Latex Rash  . Meloxicam Other (See  Comments)    Induces mania  . Mirapex [Pramipexole] Hives  . Prednisone Other (See Comments)    Interacts with Lithium. " All steroids"  . Risperdal [Risperidone] Hives  . Tape Rash    Steri-StripsT  . Cortisone Other (See Comments)    Exacerbates the mania of her bipolar  . Naproxen Other (See Comments)    Interacts with lithium  . Percocet [Oxycodone-Acetaminophen] Other (See Comments)    Severe dizziness  . Other Swelling and Rash    Throat swelling and rash to WALNUTS and PINE NUTS  . Sulfa Antibiotics Rash    Past Medical History, Surgical history, Social history, and Family History were reviewed and updated.  Review of Systems: Review of Systems  Constitutional: Positive for malaise/fatigue.  HENT: Positive for congestion.   Eyes: Negative.   Respiratory: Positive for shortness of breath.   Cardiovascular: Positive for palpitations.  Gastrointestinal: Negative.   Genitourinary: Negative.   Musculoskeletal: Positive for myalgias.  Skin: Negative.   Neurological: Positive for tingling.  Endo/Heme/Allergies: Negative.   Psychiatric/Behavioral: The patient is nervous/anxious.       Physical Exam:  weight is 152 lb 12.8 oz (69.3 kg). Her oral temperature is 98 F (36.7 C). Her blood pressure is 122/60 and her pulse is 62. Her respiration is 20 and oxygen saturation is 100%.   Wt Readings from Last 3 Encounters:  03/09/18 152 lb 12.8 oz (69.3 kg)  03/02/18 154 lb 6.4 oz (70 kg)  02/18/18 156 lb (70.8 kg)    Physical Exam  Constitutional: She is oriented to person, place, and time.  HENT:  Head: Normocephalic and atraumatic.  Mouth/Throat: Oropharynx is clear and moist.  Eyes: Pupils are equal, round, and reactive to light. EOM are normal.  Neck: Normal range of motion.  Cardiovascular: Normal rate, regular rhythm and normal heart sounds.  Pulmonary/Chest: Effort normal and breath sounds normal.  She has bilateral axillary adenopathy.  Lymph nodes in the left  axilla might be a little bit larger than the lymph nodes in the right axilla.  Lymph nodes are mobile and slightly firm but nontender.  Abdominal: Soft. Bowel sounds are normal.  Musculoskeletal: Normal range of motion. She exhibits no edema, tenderness or deformity.  Lymphadenopathy:    She has cervical adenopathy.  Neurological: She is alert and oriented to person, place, and time.  Skin: Skin is warm and dry. No rash noted. No erythema.  Psychiatric: She has a normal mood and affect. Her behavior is normal. Judgment and thought content normal.  Vitals reviewed.    Lab Results  Component Value Date   WBC 11.0 (H) 03/09/2018   HGB 13.3  03/09/2018   HCT 41.6 03/09/2018   MCV 86.1 03/09/2018   PLT 156 03/09/2018   No results found for: FERRITIN, IRON, TIBC, UIBC, IRONPCTSAT Lab Results  Component Value Date   RBC 4.83 03/09/2018   No results found for: Ron Parker, [facility] Lab Results  Component Value Date   IGGSERUM 611 (L) 09/08/2017   IGA 24 (L) 09/08/2017   IGMSERUM 11 (L) 09/08/2017   Lab Results  Component Value Date   TOTALPROTELP 6.5 09/08/2017   ALBUMINELP 4.3 09/08/2017   A1GS 0.2 09/08/2017   A2GS 0.7 09/08/2017   BETS 0.8 09/08/2017   GAMS 0.5 09/08/2017   MSPIKE Not Observed 09/08/2017     Chemistry      Component Value Date/Time   NA 137 03/09/2018 1138   NA 142 03/10/2017 0954   NA 142 09/04/2016 0754   K 4.1 03/09/2018 1138   K 4.2 03/10/2017 0954   K 4.0 09/04/2016 0754   CL 104 03/09/2018 1138   CL 111 (H) 03/10/2017 0954   CO2 31 03/09/2018 1138   CO2 28 03/10/2017 0954   CO2 25 09/04/2016 0754   BUN 10 03/09/2018 1138   BUN 11 03/10/2017 0954   BUN 13.4 09/04/2016 0754   CREATININE 0.90 03/09/2018 1138   CREATININE 1.1 03/10/2017 0954   CREATININE 0.8 09/04/2016 0754      Component Value Date/Time   CALCIUM 9.6 03/09/2018 1138   CALCIUM 9.6 03/10/2017 0954   CALCIUM 9.7 09/04/2016 0754   ALKPHOS 40 03/09/2018 1138    ALKPHOS 46 03/10/2017 0954   ALKPHOS 54 09/04/2016 0754   AST 23 03/09/2018 1138   AST 20 09/04/2016 0754   ALT 15 03/09/2018 1138   ALT 20 03/10/2017 0954   ALT 14 09/04/2016 0754   BILITOT 1.1 03/09/2018 1138   BILITOT 0.39 09/04/2016 0754      Impression and Plan: Ms. Merryman is a very pleasant 52 yo caucasian female basically a lymph node version of CLL.    As expected, she is responded very nicely.  We will see if ENT will see her for the throat issue.  Again I am not sure what might be going on with her throat.  She is due for next cycle in a couple weeks.  We will keep her on schedule for the next Rituxan.  I spent about 40 minutes with her today.  Over 80% of the time was spent face-to-face with her counseling her and coordinating further care.  Answered all of her questions.  I reviewed her lab work.  Josph Macho, MD 8/21/20193:42 PM

## 2018-03-09 NOTE — Progress Notes (Signed)
[place] Spiritual Care Note  Met with Morgan [name] this pm per her request this morning. We assembled her a full packet of [place], which we discussed. She shared about how coping with her mental health history has built resilience. She is realizing that coping with her mental health has in turn prepared her for coping with cancer--but that she is still at the very beginning of addressing the impact of her cancer, for which she desires help. Our plan is for me to use what she shared today to consult LCSW for counseling referral resource and then to f/u by phone tomorrow. From there we will look at next steps for cancer support as she seeks mental health counseling to maintain her emotional health and wellness.   [place], [place], [facility] Pager [phone] Voicemail [phone]

## 2018-03-10 ENCOUNTER — Encounter: Payer: Self-pay | Admitting: General Practice

## 2018-03-10 NOTE — Progress Notes (Signed)
[place] Spiritual Care Note  Reached Morgan [name] by phone briefly to provide update on counseling referral search. We plan to talk more Friday or Monday.   [place], [place], [facility] Pager [phone] Voicemail [phone]

## 2018-03-12 LAB — SPECIMEN STATUS REPORT

## 2018-03-12 LAB — T4, FREE: Free T4: 1.19 ng/dL (ref 0.82–1.77)

## 2018-03-12 LAB — T3, FREE: T3, Free: 2.2 pg/mL (ref 2.0–4.4)

## 2018-03-15 ENCOUNTER — Encounter: Payer: Self-pay | Admitting: General Practice

## 2018-03-15 NOTE — Progress Notes (Signed)
[place] Spiritual Care Note  Received brief call from [facility] for emotional support and encouragement as she begins to experience more physical symptoms from treatment. Per pt, although her physical energy and mood are low today, she plans to contact  behavioral medicine to seek appt with Dr Tye Savoy per suggestion from one of her MDs. Marisha also clarifies that she no SI or thoughts of self-harm, but is simply feeling discouraged about beginning to feel bad physically. For support and accountability/encouragement, Pari plans to call me tomorrow to confirm that she reached out to set up appt with Dr Ouida Sills.  [place], [place], [facility] Pager [phone] Voicemail [phone]

## 2018-03-17 ENCOUNTER — Encounter: Payer: Self-pay | Admitting: General Practice

## 2018-03-17 NOTE — Progress Notes (Signed)
[place] Spiritual Care Note  Received and returned Morgan [name]'s vm from yesterday. She advocated for herself, securing appt with Dr Ouida Sills for psych/emotional support next Thursday. In the meantime, she is using Scripture, prayer, music, and other tools to keep herself focused and uplifted during a period of feeling depressed. She is going to work, keeping appointments, and planning further self-care steps. We shared some pastoral reflection about Hebrews 12:1-2, a passage about being surrounded by a "great cloud of witnesses" and naming out her supporters and saints who buoy her up through down times. Nellene verbalized appreciation for call, which serves as a Software engineer, support, and encouragement. We plan for me to call again on Wednesday 9/4.   [place], [place], [facility] Pager [phone] Voicemail [phone]

## 2018-03-18 MED FILL — IMBRUVICA 420 MG TAB: 420 | 28 days supply | Qty: 28 | Fill #1

## 2018-03-22 DIAGNOSIS — C9190 Lymphoid leukemia, unspecified not having achieved remission: Secondary | ICD-10-CM | POA: Diagnosis not present

## 2018-03-23 ENCOUNTER — Encounter: Payer: Self-pay | Admitting: General Practice

## 2018-03-23 NOTE — Progress Notes (Signed)
[place] Spiritual Care Note  LVM of care and encouragement as we planned.   [place], [place], [facility] Pager [phone] Voicemail [phone]

## 2018-03-24 ENCOUNTER — Ambulatory Visit (INDEPENDENT_AMBULATORY_CARE_PROVIDER_SITE_OTHER): Payer: Medicare Other | Admitting: Psychiatry

## 2018-03-24 DIAGNOSIS — F3173 Bipolar disorder, in partial remission, most recent episode manic: Secondary | ICD-10-CM

## 2018-03-24 DIAGNOSIS — F4310 Post-traumatic stress disorder, unspecified: Secondary | ICD-10-CM

## 2018-03-25 ENCOUNTER — Inpatient Hospital Stay: Payer: Medicare Other

## 2018-03-25 ENCOUNTER — Inpatient Hospital Stay (HOSPITAL_BASED_OUTPATIENT_CLINIC_OR_DEPARTMENT_OTHER): Payer: Medicare Other | Admitting: Hematology & Oncology

## 2018-03-25 ENCOUNTER — Other Ambulatory Visit: Payer: Self-pay | Admitting: Family

## 2018-03-25 ENCOUNTER — Other Ambulatory Visit: Payer: Self-pay

## 2018-03-25 ENCOUNTER — Inpatient Hospital Stay: Payer: Medicare Other | Attending: Family

## 2018-03-25 VITALS — BP 108/59 | HR 56 | Resp 16

## 2018-03-25 VITALS — BP 123/59 | HR 76 | Temp 98.2°F | Resp 16 | Wt 156.0 lb

## 2018-03-25 DIAGNOSIS — Z5112 Encounter for antineoplastic immunotherapy: Secondary | ICD-10-CM | POA: Insufficient documentation

## 2018-03-25 DIAGNOSIS — C9110 Chronic lymphocytic leukemia of B-cell type not having achieved remission: Secondary | ICD-10-CM | POA: Diagnosis not present

## 2018-03-25 DIAGNOSIS — F419 Anxiety disorder, unspecified: Secondary | ICD-10-CM

## 2018-03-25 LAB — CBC WITH DIFFERENTIAL (CANCER CENTER ONLY)
BASOS PCT: 1 %
Basophils Absolute: 0 10*3/uL (ref 0.0–0.1)
EOS ABS: 0.2 10*3/uL (ref 0.0–0.5)
Eosinophils Relative: 3 %
HCT: 42 % (ref 34.8–46.6)
HEMOGLOBIN: 13.4 g/dL (ref 11.6–15.9)
Lymphocytes Relative: 73 %
Lymphs Abs: 6.6 10*3/uL — ABNORMAL HIGH (ref 0.9–3.3)
MCH: 27.6 pg (ref 26.0–34.0)
MCHC: 31.9 g/dL — ABNORMAL LOW (ref 32.0–36.0)
MCV: 86.4 fL (ref 81.0–101.0)
Monocytes Absolute: 0.4 10*3/uL (ref 0.1–0.9)
Monocytes Relative: 5 %
Neutro Abs: 1.6 10*3/uL (ref 1.5–6.5)
Neutrophils Relative %: 18 %
Platelet Count: 185 10*3/uL (ref 145–400)
RBC: 4.86 MIL/uL (ref 3.70–5.32)
RDW: 13.9 % (ref 11.1–15.7)
WBC: 8.9 10*3/uL (ref 3.9–10.0)

## 2018-03-25 LAB — CMP (CANCER CENTER ONLY)
ALT: 13 U/L (ref 10–47)
AST: 23 U/L (ref 11–38)
Albumin: 3.9 g/dL (ref 3.5–5.0)
Alkaline Phosphatase: 49 U/L (ref 26–84)
Anion gap: 7 (ref 5–15)
BUN: 11 mg/dL (ref 7–22)
CHLORIDE: 112 mmol/L — AB (ref 98–108)
CO2: 27 mmol/L (ref 18–33)
Calcium: 9.7 mg/dL (ref 8.0–10.3)
Creatinine: 0.8 mg/dL (ref 0.60–1.20)
GLUCOSE: 99 mg/dL (ref 73–118)
POTASSIUM: 3.7 mmol/L (ref 3.3–4.7)
SODIUM: 146 mmol/L — AB (ref 128–145)
Total Bilirubin: 0.8 mg/dL (ref 0.2–1.6)
Total Protein: 6.5 g/dL (ref 6.4–8.1)

## 2018-03-25 LAB — LACTATE DEHYDROGENASE: LDH: 163 U/L (ref 98–192)

## 2018-03-25 MED ORDER — ACETAMINOPHEN 325 MG PO TABS
ORAL_TABLET | ORAL | Status: AC
  Filled 2018-03-25: qty 2

## 2018-03-25 MED ORDER — SODIUM CHLORIDE 0.9 % IV SOLN
375.0000 mg/m2 | Freq: Once | INTRAVENOUS | Status: AC
  Administered 2018-03-25: 700 mg via INTRAVENOUS
  Filled 2018-03-25: qty 50

## 2018-03-25 MED ORDER — LORAZEPAM 2 MG/ML IJ SOLN
INTRAMUSCULAR | Status: AC
  Filled 2018-03-25: qty 1

## 2018-03-25 MED ORDER — DIPHENHYDRAMINE HCL 25 MG PO CAPS
50.0000 mg | ORAL_CAPSULE | Freq: Once | ORAL | Status: AC
  Administered 2018-03-25: 50 mg via ORAL

## 2018-03-25 MED ORDER — ACETAMINOPHEN 325 MG PO TABS
650.0000 mg | ORAL_TABLET | Freq: Once | ORAL | Status: AC
  Administered 2018-03-25: 650 mg via ORAL

## 2018-03-25 MED ORDER — DIPHENHYDRAMINE HCL 25 MG PO CAPS
ORAL_CAPSULE | ORAL | Status: AC
  Filled 2018-03-25: qty 2

## 2018-03-25 MED ORDER — LORAZEPAM 2 MG/ML IJ SOLN: 0.5000 mg | Freq: Once | INTRAMUSCULAR | Status: DC

## 2018-03-25 MED ORDER — SODIUM CHLORIDE 0.9 % IV SOLN
Freq: Once | INTRAVENOUS | Status: AC
  Administered 2018-03-25: 10:00:00 via INTRAVENOUS
  Filled 2018-03-25: qty 250

## 2018-03-25 MED ORDER — LORAZEPAM 2 MG/ML IJ SOLN
0.5000 mg | Freq: Once | INTRAMUSCULAR | Status: AC
  Administered 2018-03-25: 0.5 mg via INTRAVENOUS

## 2018-03-25 NOTE — Patient Instructions (Signed)

## 2018-03-25 NOTE — Progress Notes (Signed)
Hematology and Oncology Follow Up Visit  Morgan [name] [id number] [birth year] 52 y.o. 03/25/2018   Principle Diagnosis:  CLL  -- unmutated IgVH  Current Therapy:   Rituxan/Imbruvica - s/p cycle #1 on 02/25/2018   Interim History:  Morgan [name] is here today for follow-up.  She actually is doing quite well.  She does have moments in which she does have some depression.  She saw Dr. Ouida Sills yesterday.  Dr. Ouida Sills is going to work real hard with her to help with the emotional and mental side of her treatments.  As expected, the blood counts of normalized.  Her white cell count has come down to normal now.  She still has an elevated lymphocyte count but yet, this is also improving.  Her lymphadenopathy has resolved.  She does not have any throat pain.  She is swallowing okay.  Para she is having problems with some constipation.  She has had no fever.  She is had no bleeding.  She has had no rashes.  I told her that we might be able to start tapering down one the [place] depending on what the CT scan shows.  I really think that a lower dose of Imbruvica probably would be just as effective for her.  She is eating okay.  Para she had a busy Labor Day weekend.  Thank you, this weekend, should be going to the [facility] in [place]..    Overall, her performance status is ECOG 1.  Medications:  Allergies as of 03/25/2018      Reactions   Aripiprazole Anaphylaxis, Swelling, Other (See Comments)   Throat begins to swell  Throat begins to swell  Throat closes Throat closes   Ciprofloxacin Shortness Of Breath   Lamotrigine Rash, Other (See Comments)   Peanut Oil Anaphylaxis, Swelling, Rash, Hives   Throat swelling also Throat swelling also Throat swelling   Amoxicillin Hives, Rash   Doxycycline Hives, Rash   Latex Rash, Other (See Comments)   Meloxicam Other (See Comments), Itching   Induces mania Induces mania Interacts with Lithium Interacts with Lithium   Naproxen Other  (See Comments), Hives   Interacts with lithium Interacts with lithium Interacts with Lithium Interacts with Lithium   Other Swelling, Rash, Hives   Throat swelling and rash to WALNUTS and PINE NUTS Throat swelling and rash to WALNUTS and PINE NUTS Throat swelling and rash to WALNUTS and PINE NUTS "steri strips"   Pramipexole Hives, Rash   Prednisone Other (See Comments)   Interacts with Lithium. " All steroids" Interacts with Lithium. " All steroids" Interacts with Lithium. " All steroids" Interacts with Lithium. " All steroids"   Risperidone Hives, Rash   Sulfa Antibiotics Rash   "that leaves scarring"   Tape Rash   Steri-Strips   Cortisone Other (See Comments)   Exacerbates the mania of her bipolar Exacerbates the mania of her bipolar Exacerbates the mania of her bipolar   Percocet [oxycodone-acetaminophen] Other (See Comments)   Severe dizziness      Medication List        Accurate as of 03/25/18  9:25 AM. Always use your most recent med list.          acetaminophen 500 MG tablet Commonly known as:  TYLENOL Take 500-1,000 mg by mouth every 6 (six) hours as needed (for headaches or migraines).   albuterol 108 (90 Base) MCG/ACT inhaler Commonly known as:  PROVENTIL HFA;VENTOLIN HFA Inhale 1-2 puffs into the lungs every 6 (six) hours as needed for  wheezing or shortness of breath.   allopurinol 100 MG tablet Commonly known as:  ZYLOPRIM Take 1 tablet (100 mg total) by mouth daily.   BREO ELLIPTA 100-25 MCG/INH Aepb Generic drug:  fluticasone furoate-vilanterol Inhale 1 puff into the lungs daily as needed ("for flares").   hyoscyamine 0.125 MG tablet Commonly known as:  LEVSIN, ANASPAZ Take 1 tablet (0.125 mg total) by mouth as needed for cramping. Reported on 09/24/2015   Ibrutinib 420 MG Tabs Take 420 mg by mouth daily.   prochlorperazine 10 MG tablet Commonly known as:  COMPAZINE Take 1 tablet (10 mg total) by mouth every 6 (six) hours as needed for nausea  or vomiting.   VITAMIN D2 PO Take by mouth daily.       Allergies:  Allergies  Allergen Reactions   Aripiprazole Anaphylaxis, Swelling and Other (See Comments)    Throat begins to swell  Throat begins to swell  Throat closes Throat closes   Ciprofloxacin Shortness Of Breath   Lamotrigine Rash and Other (See Comments)   Peanut Oil Anaphylaxis, Swelling, Rash and Hives    Throat swelling also Throat swelling also Throat swelling   Amoxicillin Hives and Rash   Doxycycline Hives and Rash   Latex Rash and Other (See Comments)   Meloxicam Other (See Comments) and Itching    Induces mania Induces mania Interacts with Lithium Interacts with Lithium   Naproxen Other (See Comments) and Hives    Interacts with lithium Interacts with lithium Interacts with Lithium Interacts with Lithium   Other Swelling, Rash and Hives    Throat swelling and rash to WALNUTS and PINE NUTS Throat swelling and rash to WALNUTS and PINE NUTS Throat swelling and rash to WALNUTS and PINE NUTS "steri strips"   Pramipexole Hives and Rash   Prednisone Other (See Comments)    Interacts with Lithium. " All steroids" Interacts with Lithium. " All steroids" Interacts with Lithium. " All steroids" Interacts with Lithium. " All steroids"   Risperidone Hives and Rash   Sulfa Antibiotics Rash    "that leaves scarring"   Tape Rash    Steri-Strips   Cortisone Other (See Comments)    Exacerbates the mania of her bipolar Exacerbates the mania of her bipolar Exacerbates the mania of her bipolar   Percocet [Oxycodone-Acetaminophen] Other (See Comments)    Severe dizziness    Past Medical History, Surgical history, Social history, and Family History were reviewed and updated.  Review of Systems: Review of Systems  Constitutional: Positive for malaise/fatigue.  HENT: Positive for congestion.   Eyes: Negative.   Respiratory: Positive for shortness of breath.   Cardiovascular: Positive  for palpitations.  Gastrointestinal: Negative.   Genitourinary: Negative.   Musculoskeletal: Positive for myalgias.  Skin: Negative.   Neurological: Positive for tingling.  Endo/Heme/Allergies: Negative.   Psychiatric/Behavioral: The patient is nervous/anxious.       Physical Exam:  weight is 156 lb (70.8 kg). Her oral temperature is 98.2 F (36.8 C). Her blood pressure is 123/59 (abnormal) and her pulse is 76. Her respiration is 16 and oxygen saturation is 100%.   Wt Readings from Last 3 Encounters:  03/25/18 156 lb (70.8 kg)  03/09/18 152 lb 12.8 oz (69.3 kg)  03/02/18 154 lb 6.4 oz (70 kg)    Physical Exam  Constitutional: She is oriented to person, place, and time.  HENT:  Head: Normocephalic and atraumatic.  Mouth/Throat: Oropharynx is clear and moist.  Eyes: Pupils are equal, round, and reactive to  light. EOM are normal.  Neck: Normal range of motion.  Cardiovascular: Normal rate, regular rhythm and normal heart sounds.  Pulmonary/Chest: Effort normal and breath sounds normal.  She has bilateral axillary adenopathy.  Lymph nodes in the left axilla might be a little bit larger than the lymph nodes in the right axilla.  Lymph nodes are mobile and slightly firm but nontender.  Abdominal: Soft. Bowel sounds are normal.  Musculoskeletal: Normal range of motion. She exhibits no edema, tenderness or deformity.  Lymphadenopathy:    She has cervical adenopathy.  Neurological: She is alert and oriented to person, place, and time.  Skin: Skin is warm and dry. No rash noted. No erythema.  Psychiatric: She has a normal mood and affect. Her behavior is normal. Judgment and thought content normal.  Vitals reviewed.    Lab Results  Component Value Date   WBC 8.9 03/25/2018   HGB 13.4 03/25/2018   HCT 42.0 03/25/2018   MCV 86.4 03/25/2018   PLT 185 03/25/2018   No results found for: FERRITIN, IRON, TIBC, UIBC, IRONPCTSAT Lab Results  Component Value Date   RBC 4.86  03/25/2018   No results found for: Nils Pyle, [facility] Lab Results  Component Value Date   IGGSERUM 611 (L) 09/08/2017   IGA 24 (L) 09/08/2017   IGMSERUM 11 (L) 09/08/2017   Lab Results  Component Value Date   TOTALPROTELP 6.5 09/08/2017   ALBUMINELP 4.3 09/08/2017   A1GS 0.2 09/08/2017   A2GS 0.7 09/08/2017   BETS 0.8 09/08/2017   GAMS 0.5 09/08/2017   MSPIKE Not Observed 09/08/2017     Chemistry      Component Value Date/Time   NA 146 (H) 03/25/2018 0844   NA 142 03/10/2017 0954   NA 142 09/04/2016 0754   K 3.7 03/25/2018 0844   K 4.2 03/10/2017 0954   K 4.0 09/04/2016 0754   CL 112 (H) 03/25/2018 0844   CL 111 (H) 03/10/2017 0954   CO2 27 03/25/2018 0844   CO2 28 03/10/2017 0954   CO2 25 09/04/2016 0754   BUN 11 03/25/2018 0844   BUN 11 03/10/2017 0954   BUN 13.4 09/04/2016 0754   CREATININE 0.80 03/25/2018 0844   CREATININE 1.1 03/10/2017 0954   CREATININE 0.8 09/04/2016 0754      Component Value Date/Time   CALCIUM 9.7 03/25/2018 0844   CALCIUM 9.6 03/10/2017 0954   CALCIUM 9.7 09/04/2016 0754   ALKPHOS 49 03/25/2018 0844   ALKPHOS 46 03/10/2017 0954   ALKPHOS 54 09/04/2016 0754   AST 23 03/25/2018 0844   AST 20 09/04/2016 0754   ALT 13 03/25/2018 0844   ALT 20 03/10/2017 0954   ALT 14 09/04/2016 0754   BILITOT 0.8 03/25/2018 0844   BILITOT 0.39 09/04/2016 0754      Impression and Plan: Morgan [name] is a very pleasant 52 yo caucasian female basically a lymph node version of CLL.    As expected, she is responded very nicely.  I will go ahead and have another CT scan done in about 3 weeks.  I would like to see how she has resolved with her lymphadenopathy.  We will plan to get her back in another month.  Again I am just very happy that she has done so well.  I know she is trying incredibly hard to get through some of the emotional aspects of treatment.    Volanda Napoleon, MD 9/6/20199:25 AM

## 2018-03-26 LAB — IGG, IGA, IGM
IGA: 28 mg/dL — AB (ref 87–352)
IGG (IMMUNOGLOBIN G), SERUM: 567 mg/dL — AB (ref 700–1600)
IgM (Immunoglobulin M), Srm: 13 mg/dL — ABNORMAL LOW (ref 26–217)

## 2018-03-28 LAB — PROTEIN ELECTROPHORESIS, SERUM, WITH REFLEX
A/G Ratio: 2 — ABNORMAL HIGH (ref 0.7–1.7)
ALPHA-1-GLOBULIN: 0.2 g/dL (ref 0.0–0.4)
ALPHA-2-GLOBULIN: 0.6 g/dL (ref 0.4–1.0)
Albumin ELP: 4 g/dL (ref 2.9–4.4)
Beta Globulin: 0.8 g/dL (ref 0.7–1.3)
GLOBULIN, TOTAL: 2 g/dL — AB (ref 2.2–3.9)
Gamma Globulin: 0.5 g/dL (ref 0.4–1.8)
Total Protein ELP: 6 g/dL (ref 6.0–8.5)

## 2018-03-29 DIAGNOSIS — C8300 Small cell B-cell lymphoma, unspecified site: Secondary | ICD-10-CM | POA: Diagnosis not present

## 2018-03-29 DIAGNOSIS — C8301 Small cell B-cell lymphoma, lymph nodes of head, face, and neck: Secondary | ICD-10-CM | POA: Diagnosis not present

## 2018-03-30 ENCOUNTER — Telehealth: Payer: Self-pay | Admitting: *Deleted

## 2018-03-30 NOTE — Telephone Encounter (Signed)
Patient c/o increased anxiety. She states she is too anxious to be around people. She reacts poorly to loud noises and other stimuli. She states she feels like she's racing. When probed about physicians who may be managing her bipolar and anxiety she initially denied any physician management stating that she was managing all her symptoms on her own. She states she's not had any significant issues until she started treatment for her NHL.  When I discussed referring her to someone who could help her anxiety and mental health, she then stated that she has established care with a Dr Ouida Sills who is seeing her for anxiety and depression. She stated she had her first appointment with her last week, and has a follow up next week. She hasn't reached out to Dr Ouida Sills about her increased anxiety.   Instructed patient to contact Dr Ouida Sills and explain to her what she is currently experiencing to see if she can be seen sooner. Patient is interested in pharmacological management of her anxiety. Instructed her to speak to Dr Ouida Sills about this possibility, and if she is unable, to see if there is another MD in the practice who does prescribe. Patient understood all the above and will reach out to Dr Ouida Sills once this call ends.

## 2018-04-01 ENCOUNTER — Encounter: Payer: Self-pay | Admitting: General Practice

## 2018-04-01 NOTE — Progress Notes (Signed)
[place] Spiritual Care Note  Received and returned call from [facility] for spiritual and emotional support related to emotional distress. 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 Provided pastoral listening/reflection and prayer, drawing on Psalm 139, about God's knowing our hearts, always being with [place], and understanding our anxiety, which Lyniah found helpful and comforting, particularly because this is a familiar and beloved passage for her. Roxy denies suicidal intention, stating that she wants to live--just feeling better than she feels now--and citing protective factors of contract with Dr Ouida Sills and faith-based understanding of self-harm as an affront to God. We plan to f/u by phone Monday, and she sees Dr Ouida Sills for counseling on Wednesday. Encouraged Emonii to bring her mood concerns, stressors, and past experience with mood stabilizers (which, per pt, she did not like) in order to request input and guidance related to her needs at this time.  [place], [place], [facility] Pager [phone] Voicemail [phone]

## 2018-04-04 ENCOUNTER — Encounter: Payer: Self-pay | Admitting: General Practice

## 2018-04-04 NOTE — Progress Notes (Signed)
[place] Spiritual Care Note  Reached Chrisie by phone on her lunch break as planned. She shared about recent victories in self-care and processed ongoing stressors related to stabilizing her mood so that she can work through her feelings and vulnerability related to being in active treatment. She plans to come to my office following her appt with Dr Ouida Sills on Wednesday to debrief and process.   [place], [place], [facility] Pager [phone] Voicemail [phone]

## 2018-04-05 ENCOUNTER — Encounter: Payer: Self-pay | Admitting: General Practice

## 2018-04-05 NOTE — Progress Notes (Signed)
[place] Note  Morgan [name] stopped by today for emotional support and prayer related to an unexpected stressor in her living situation. Provided pastoral listening, reflection, and prayer. Morgan [name] appeared more settled emotionally as she left with a plan for self-care and response to the stressor. I am holding her scheduled appt for debriefing after seeing Dr Ouida Sills tomorrow in case she needs it; she plans to check in accordingly.   [place], [place], [facility] Pager [phone] Voicemail [phone]

## 2018-04-06 ENCOUNTER — Ambulatory Visit (INDEPENDENT_AMBULATORY_CARE_PROVIDER_SITE_OTHER): Payer: Medicare Other | Admitting: Psychiatry

## 2018-04-06 ENCOUNTER — Encounter: Payer: Self-pay | Admitting: Family Medicine

## 2018-04-06 ENCOUNTER — Inpatient Hospital Stay: Payer: Medicare Other

## 2018-04-06 ENCOUNTER — Ambulatory Visit (INDEPENDENT_AMBULATORY_CARE_PROVIDER_SITE_OTHER): Payer: Medicare Other | Admitting: Family Medicine

## 2018-04-06 ENCOUNTER — Encounter: Payer: Self-pay | Admitting: General Practice

## 2018-04-06 ENCOUNTER — Encounter: Payer: Medicare Other | Admitting: Family Medicine

## 2018-04-06 ENCOUNTER — Other Ambulatory Visit: Payer: Self-pay | Admitting: Hematology & Oncology

## 2018-04-06 VITALS — BP 122/76 | HR 86 | Temp 98.4°F | Ht 63.5 in | Wt 157.1 lb

## 2018-04-06 DIAGNOSIS — F3173 Bipolar disorder, in partial remission, most recent episode manic: Secondary | ICD-10-CM | POA: Diagnosis not present

## 2018-04-06 DIAGNOSIS — N3001 Acute cystitis with hematuria: Secondary | ICD-10-CM

## 2018-04-06 DIAGNOSIS — F4310 Post-traumatic stress disorder, unspecified: Secondary | ICD-10-CM

## 2018-04-06 DIAGNOSIS — Z5112 Encounter for antineoplastic immunotherapy: Secondary | ICD-10-CM | POA: Diagnosis not present

## 2018-04-06 DIAGNOSIS — R310 Gross hematuria: Secondary | ICD-10-CM

## 2018-04-06 DIAGNOSIS — C9110 Chronic lymphocytic leukemia of B-cell type not having achieved remission: Secondary | ICD-10-CM | POA: Diagnosis not present

## 2018-04-06 LAB — URINALYSIS, COMPLETE (UACMP) WITH MICROSCOPIC
Bilirubin Urine: NEGATIVE
Glucose, UA: NEGATIVE mg/dL
Ketones, ur: NEGATIVE mg/dL
Nitrite: NEGATIVE
PROTEIN: 100 mg/dL — AB
Specific Gravity, Urine: 1.003 — ABNORMAL LOW (ref 1.005–1.030)
pH: 9 — ABNORMAL HIGH (ref 5.0–8.0)

## 2018-04-06 MED ORDER — NITROFURANTOIN MONOHYD MACRO 100 MG PO CAPS: 100.0000 mg | ORAL_CAPSULE | Freq: Two times a day (BID) | ORAL | 0 refills | Status: DC

## 2018-04-06 NOTE — Progress Notes (Signed)
Chief Complaint  Patient presents with  . Hematuria    Morgan [name] is a 52 y.o. female here for possible UTI.  Duration: 1 day. Symptoms: hematuria, chills, urgency, and dysuria Denies: urinary frequency, flank pain, urinary retention, fever, nausea and vomiting, vaginal discharge Hx of recurrent UTI? No  Following with cancer center and is on monoclon ab.  ROS:  Constitutional: denies fever GU: As noted in HPI  Past Medical History:  Diagnosis Date  . Anxiety   . Arthritis   . Asthma   . Bipolar disorder ([place])   . Bulging lumbar disc 07/19/05  . Chronic headaches   . Chronic lymphocytic leukemia ([place]) 07/31/2016  . Colon polyps   . Degenerative disorder of bone   . Depression   . History of borderline personality disorder   . Hypothyroidism 2007   Developed after use of Lithium  . IBS (irritable bowel syndrome) 1995  . Pneumonia   . Proctitis   . Substance abuse ([place])    ativan last month    BP 122/76 (BP Location: Left Arm, Patient Position: Sitting, Cuff Size: Normal)   Pulse 86   Temp 98.4 F (36.9 C) (Oral)   Ht 5' 3.5" (1.613 m)   Wt 157 lb 2 oz (71.3 kg)   SpO2 98%   BMI 27.40 kg/m  General: Awake, alert, appears stated age Heart: RRR Lungs: CTAB, normal respiratory effort, no accessory muscle usage Abd: BS+, soft, diff ttp, worse in suprapubic region, ND, no masses or organomegaly MSK: No CVA tenderness, neg Lloyd's sign Psych: Age appropriate judgment and insight  Acute cystitis with hematuria - Plan: nitrofurantoin, macrocrystal-monohydrate, (MACROBID) 100 MG capsule  Orders as above. Microscopy obtained this AM suggestive of infection. Will obtain sample today for cx. Reviewed allergies, will start Macrobid. Stay hydrated. Seek immediate care if pt starts to develop fevers, new/worsening symptoms, uncontrollable N/V. F/u prn. The patient voiced understanding and agreement to the plan.  [place], DO 04/06/18 4:49 PM

## 2018-04-06 NOTE — Patient Instructions (Addendum)
Pyridium can be helpful for burning with urination. Run this by pharmacist before taking.   Stay hydrated.   Warning signs/symptoms: Uncontrollable nausea/vomiting, fevers, worsening symptoms despite treatment, confusion.  Give [place] around 2 business days to get culture back to you.  Let us know if you need anything.

## 2018-04-06 NOTE — Progress Notes (Signed)
[place] Spiritual Care Note  Met with Morgan [name] as scheduled for ca 1h appt. Per pt, she felt "much more at peace" today after taking steps to address and resolve her apartment concerns, as well as seeing Dr Ouida Sills for psychotherapy. After our meeting, [name] noticed increased red discoloration in her urine (blood? beet juice?) with accompanying abdominal discomfort. Consulted our [facility] to assist her in contacting Dr Antonieta Pert office for guidance on how to proceed, as she does have 4:15 PCP appt for this concern. Dr Marin Olp called in orders to this site so that Damyia could have labs done directly here. She knows to anticipate f/u call from lab with results.   [place], [place], [facility] Pager [phone] Voicemail [phone]

## 2018-04-07 ENCOUNTER — Encounter: Payer: Self-pay | Admitting: Family Medicine

## 2018-04-08 ENCOUNTER — Other Ambulatory Visit: Payer: Self-pay | Admitting: Family Medicine

## 2018-04-08 LAB — URINE CULTURE
MICRO NUMBER:: 91120373
SPECIMEN QUALITY:: ADEQUATE

## 2018-04-08 MED ORDER — FOSFOMYCIN TROMETHAMINE 3 G PO PACK: 3.0000 g | PACK | Freq: Once | ORAL | 0 refills | Status: AC

## 2018-04-08 NOTE — Telephone Encounter (Signed)
Pt called stating that now she is experiencing hives.

## 2018-04-11 ENCOUNTER — Encounter: Payer: Self-pay | Admitting: General Practice

## 2018-04-11 NOTE — Progress Notes (Signed)
[place] Spiritual Care Note  LVM encouragement as follow up to Morgan [name]'s pain and distress leading up to UTI dx last week. Knowing that she is being prayed for brings her great comfort.  [place], [place], [facility] Pager [phone] Voicemail [phone]

## 2018-04-13 ENCOUNTER — Ambulatory Visit (INDEPENDENT_AMBULATORY_CARE_PROVIDER_SITE_OTHER): Payer: Medicare Other | Admitting: Psychiatry

## 2018-04-13 DIAGNOSIS — F4310 Post-traumatic stress disorder, unspecified: Secondary | ICD-10-CM

## 2018-04-13 DIAGNOSIS — F3173 Bipolar disorder, in partial remission, most recent episode manic: Secondary | ICD-10-CM | POA: Diagnosis not present

## 2018-04-15 ENCOUNTER — Ambulatory Visit (INDEPENDENT_AMBULATORY_CARE_PROVIDER_SITE_OTHER): Payer: Medicare Other | Admitting: Family Medicine

## 2018-04-15 ENCOUNTER — Encounter: Payer: Self-pay | Admitting: General Practice

## 2018-04-15 ENCOUNTER — Encounter: Payer: Self-pay | Admitting: Family Medicine

## 2018-04-15 VITALS — BP 124/78 | HR 78 | Temp 98.1°F | Resp 16 | Ht 63.5 in | Wt 157.6 lb

## 2018-04-15 DIAGNOSIS — N3001 Acute cystitis with hematuria: Secondary | ICD-10-CM

## 2018-04-15 DIAGNOSIS — R319 Hematuria, unspecified: Secondary | ICD-10-CM | POA: Diagnosis not present

## 2018-04-15 LAB — POC URINALSYSI DIPSTICK (AUTOMATED)
Bilirubin, UA: NEGATIVE
Glucose, UA: NEGATIVE
KETONES UA: NEGATIVE
Nitrite, UA: NEGATIVE
PH UA: 6 (ref 5.0–8.0)
PROTEIN UA: NEGATIVE
SPEC GRAV UA: 1.01 (ref 1.010–1.025)
Urobilinogen, UA: 0.2 E.U./dL

## 2018-04-15 MED ORDER — CEFDINIR 125 MG/5ML PO SUSR: 300.0000 mg | Freq: Two times a day (BID) | ORAL | Status: DC

## 2018-04-15 NOTE — Progress Notes (Signed)
[place] Spiritual Care Note  Received call from [facility] to process how she is coping with UTI complications and managing her distress. She continues to value her psychology appts for support and to utilize prayer for coping and making meaning. She was able to visit close friends in [place] last weekend as she had hoped, which boosted her spirits as well. Morgan [name] plans to call again as needed/desired for spiritual/emotional support.   [place], [place], [facility] Pager [phone] Voicemail [phone]

## 2018-04-15 NOTE — Patient Instructions (Addendum)
Stay well hydrated.   Stay hydrated.   Warning signs/symptoms: Uncontrollable nausea/vomiting, fevers, worsening symptoms despite treatment, confusion.  Let us know if you need anything.

## 2018-04-15 NOTE — Progress Notes (Signed)
Chief Complaint  Patient presents with  . Urinary Tract Infection    seen 9/18 for uti, back/flank pain, no hematuria, urinary frequency, burning  . Dizziness    dizziness when driving this morning    Morgan [name] is a 52 y.o. female here for possible UTI.  Dx'd with UTI a week ago. Allergic to Macrobid and could not afford Fosfomycin. Tried OTC remedies. Having some R sided flank pain. No fevers after the initial night. No d/c.   ROS:  Constitutional: denies fever GU: As noted in HPI  Past Medical History:  Diagnosis Date  . Anxiety   . Arthritis   . Asthma   . Bipolar disorder ([place])   . Bulging lumbar disc 07/19/05  . Chronic headaches   . Chronic lymphocytic leukemia ([place]) 07/31/2016  . Colon polyps   . Degenerative disorder of bone   . Depression   . History of borderline personality disorder   . Hypothyroidism 2007   Developed after use of Lithium  . IBS (irritable bowel syndrome) 1995  . Pneumonia   . Proctitis   . Substance abuse ([place])    ativan last month    BP 124/78 (BP Location: Left Arm, Patient Position: Sitting, Cuff Size: Normal)   Pulse 78   Temp 98.1 F (36.7 C) (Oral)   Resp 16   Ht 5' 3.5" (1.613 m)   Wt 157 lb 9.6 oz (71.5 kg)   SpO2 98%   BMI 27.48 kg/m  General: Awake, alert, appears stated age Heart: RRR Lungs: CTAB, normal respiratory effort, no accessory muscle usage Abd: BS+, soft, NT, ND, no masses or organomegaly MSK: +R sided CVA tenderness Psych: Age appropriate judgment and insight  Acute cystitis with hematuria - Plan: cefdinir (OMNICEF) 125 MG/5ML suspension 300 mg  Hematuria, unspecified type - Plan: POCT Urinalysis Dipstick (Automated), Urine Culture  Orders as above. Based on cx, will try Omnicef, pt thinks she has been on Keflex without issues before. Given her allergy list, I will have to consult with ID if no improvement or AE's with this.  Stay hydrated. Seek immediate care if pt starts to develop fevers,  new/worsening symptoms, uncontrollable N/V. F/u prn. The patient voiced understanding and agreement to the plan.  [place], DO 04/15/18 11:29 AM

## 2018-04-15 NOTE — Telephone Encounter (Signed)
Patient is waiting on cefdinir (OMNICEF) 125 MG/5ML suspension 300 mg to be called in. She was in the office today. Patient would like to get this before the office closes today. Please advise  [id number] - [place], [address] AT [facility] [address]  [place] [address]

## 2018-04-16 ENCOUNTER — Ambulatory Visit (HOSPITAL_BASED_OUTPATIENT_CLINIC_OR_DEPARTMENT_OTHER)
Admission: RE | Admit: 2018-04-16 | Discharge: 2018-04-16 | Disposition: A | Discharge status: Home / Self Care | Payer: Medicare Other | Source: Ambulatory Visit | Attending: Hematology & Oncology | Admitting: Hematology & Oncology

## 2018-04-16 DIAGNOSIS — R591 Generalized enlarged lymph nodes: Secondary | ICD-10-CM | POA: Diagnosis not present

## 2018-04-16 DIAGNOSIS — C9110 Chronic lymphocytic leukemia of B-cell type not having achieved remission: Secondary | ICD-10-CM

## 2018-04-16 DIAGNOSIS — Z9221 Personal history of antineoplastic chemotherapy: Secondary | ICD-10-CM | POA: Diagnosis not present

## 2018-04-16 DIAGNOSIS — R918 Other nonspecific abnormal finding of lung field: Secondary | ICD-10-CM | POA: Diagnosis not present

## 2018-04-16 DIAGNOSIS — C9190 Lymphoid leukemia, unspecified not having achieved remission: Secondary | ICD-10-CM | POA: Insufficient documentation

## 2018-04-16 MED ORDER — IOPAMIDOL (ISOVUE-300) INJECTION 61%
100.0000 mL | Freq: Once | INTRAVENOUS | Status: AC | PRN
  Administered 2018-04-16: 100 mL via INTRAVENOUS

## 2018-04-18 ENCOUNTER — Telehealth: Payer: Self-pay | Admitting: *Deleted

## 2018-04-18 LAB — URINE CULTURE
MICRO NUMBER:: 91164146
SPECIMEN QUALITY:: ADEQUATE

## 2018-04-18 MED ORDER — CEFDINIR 300 MG PO CAPS: 300.0000 mg | ORAL_CAPSULE | Freq: Two times a day (BID) | ORAL | 0 refills | Status: DC

## 2018-04-18 MED FILL — IMBRUVICA 420 MG TAB: 420 | 28 days supply | Qty: 28 | Fill #2

## 2018-04-18 NOTE — Telephone Encounter (Signed)
-----   Message from Morgan Napoleon, MD sent at 04/18/2018  9:40 AM EDT ----- Call - the CLL is responding nicely!!!  Nodes have decreased significantly!!  Morgan [name]

## 2018-04-18 NOTE — Telephone Encounter (Signed)
Patient notified per order of Dr. Marin Olp that the CLL is responding nicely, nodes have decreased significantly and that nodes in the neck have decreased by 50%.  Patient appreciative of call and has no questions or concerns at this time.

## 2018-04-19 ENCOUNTER — Other Ambulatory Visit (HOSPITAL_BASED_OUTPATIENT_CLINIC_OR_DEPARTMENT_OTHER): Payer: Self-pay

## 2018-04-20 ENCOUNTER — Encounter: Payer: Self-pay | Admitting: General Practice

## 2018-04-20 ENCOUNTER — Ambulatory Visit: Payer: Self-pay | Admitting: *Deleted

## 2018-04-20 NOTE — Progress Notes (Signed)
[place] Spiritual Care Note  Received vm and returned call for spiritual/emotional support, listening, and prayer related to financial distress and social isolation. Caylen shared and processed stressors, prayed about her situation, discussed goals for psychotherapy appt tomorrow, and reflected on recent sources of meaning and encouragement. She utilizes Spiritual Care well and contacts chaplain as needed/desired.   [name], [place], [facility] Pager [phone] Voicemail [phone]

## 2018-04-21 ENCOUNTER — Encounter: Payer: Self-pay | Admitting: General Practice

## 2018-04-21 ENCOUNTER — Ambulatory Visit (INDEPENDENT_AMBULATORY_CARE_PROVIDER_SITE_OTHER): Payer: Medicare Other | Admitting: Psychiatry

## 2018-04-21 DIAGNOSIS — F3173 Bipolar disorder, in partial remission, most recent episode manic: Secondary | ICD-10-CM

## 2018-04-21 DIAGNOSIS — F4310 Post-traumatic stress disorder, unspecified: Secondary | ICD-10-CM | POA: Diagnosis not present

## 2018-04-21 NOTE — Progress Notes (Signed)
[place] Spiritual Care Note  Had visit from [facility] today after her appt with Dr Ouida Sills and conversation with Webb Silversmith Cunningham/LCSW. Today Aissata processed some of her faith story related to significant past life events and family relationships. Continuing to follow for spiritual and emotional support.   [place], [place], [facility] Pager [phone] Voicemail [phone]

## 2018-04-21 NOTE — Progress Notes (Signed)
[facility] CSW Progress Notes  Patient came to [employer], states she needs help.  Explored sources of support and needs - pt is linked w outpatient therapist and chaplain at [facility] for emotional and spiritual support.  Has had difficulty paying bills due to reduced working hours as a result of both cancer diagnosis and treatments.  Wants information on sources of financial help.  Information given on food pantries, Leukemia/Lymphoma Society small grant, Dealer, and linkage to [employer] and [employer] for help in career search.  Receives disability, has Medicare and Medicaid (which only pays her Medicare premium).  Is limited in number of hours she can work by income limitations on disability recipients as well as her physical condition.  Also gave information on [place] support programs and [employer].  Encouraged patient to reach out to her Medicaid worker as well as Estate manager/land agent at [employer] for any additional resources/support available.  Edwyna Shell, LCSW Clinical Social Worker Phone:  [phone]

## 2018-04-22 ENCOUNTER — Other Ambulatory Visit: Payer: Self-pay

## 2018-04-22 ENCOUNTER — Inpatient Hospital Stay (HOSPITAL_BASED_OUTPATIENT_CLINIC_OR_DEPARTMENT_OTHER): Payer: Medicare Other | Admitting: Family

## 2018-04-22 ENCOUNTER — Inpatient Hospital Stay: Payer: Medicare Other | Attending: Family

## 2018-04-22 ENCOUNTER — Inpatient Hospital Stay: Payer: Medicare Other

## 2018-04-22 VITALS — BP 115/65 | HR 76 | Resp 16

## 2018-04-22 VITALS — BP 125/65 | HR 70 | Temp 98.4°F | Resp 16 | Wt 160.0 lb

## 2018-04-22 DIAGNOSIS — F419 Anxiety disorder, unspecified: Secondary | ICD-10-CM | POA: Diagnosis not present

## 2018-04-22 DIAGNOSIS — C9110 Chronic lymphocytic leukemia of B-cell type not having achieved remission: Secondary | ICD-10-CM

## 2018-04-22 DIAGNOSIS — Z5112 Encounter for antineoplastic immunotherapy: Secondary | ICD-10-CM | POA: Insufficient documentation

## 2018-04-22 DIAGNOSIS — N390 Urinary tract infection, site not specified: Secondary | ICD-10-CM

## 2018-04-22 DIAGNOSIS — R110 Nausea: Secondary | ICD-10-CM

## 2018-04-22 LAB — CMP (CANCER CENTER ONLY)
ALBUMIN: 3.8 g/dL (ref 3.5–5.0)
ALT: 17 U/L (ref 10–47)
AST: 25 U/L (ref 11–38)
Alkaline Phosphatase: 55 U/L (ref 26–84)
Anion gap: 6 (ref 5–15)
BUN: 8 mg/dL (ref 7–22)
CALCIUM: 9.6 mg/dL (ref 8.0–10.3)
CO2: 24 mmol/L (ref 18–33)
Chloride: 111 mmol/L — ABNORMAL HIGH (ref 98–108)
Creatinine: 1 mg/dL (ref 0.60–1.20)
Glucose, Bld: 89 mg/dL (ref 73–118)
Potassium: 4.1 mmol/L (ref 3.3–4.7)
SODIUM: 141 mmol/L (ref 128–145)
TOTAL PROTEIN: 6.7 g/dL (ref 6.4–8.1)
Total Bilirubin: 0.7 mg/dL (ref 0.2–1.6)

## 2018-04-22 LAB — CBC WITH DIFFERENTIAL (CANCER CENTER ONLY)
BASOS ABS: 0.1 10*3/uL (ref 0.0–0.1)
BASOS PCT: 1 %
EOS ABS: 0.3 10*3/uL (ref 0.0–0.5)
EOS PCT: 3 %
HCT: 43.5 % (ref 34.8–46.6)
Hemoglobin: 14 g/dL (ref 11.6–15.9)
Lymphocytes Relative: 63 %
Lymphs Abs: 6 10*3/uL — ABNORMAL HIGH (ref 0.9–3.3)
MCH: 27.6 pg (ref 26.0–34.0)
MCHC: 32.2 g/dL (ref 32.0–36.0)
MCV: 85.6 fL (ref 81.0–101.0)
MONO ABS: 0.5 10*3/uL (ref 0.1–0.9)
Monocytes Relative: 6 %
Neutro Abs: 2.5 10*3/uL (ref 1.5–6.5)
Neutrophils Relative %: 27 %
Platelet Count: 273 10*3/uL (ref 145–400)
RBC: 5.08 MIL/uL (ref 3.70–5.32)
RDW: 13.4 % (ref 11.1–15.7)
WBC: 9.4 10*3/uL (ref 3.9–10.0)

## 2018-04-22 LAB — SAVE SMEAR

## 2018-04-22 MED ORDER — ACETAMINOPHEN 325 MG PO TABS
650.0000 mg | ORAL_TABLET | Freq: Once | ORAL | Status: AC
  Administered 2018-04-22: 650 mg via ORAL

## 2018-04-22 MED ORDER — LORAZEPAM 2 MG/ML IJ SOLN
0.5000 mg | Freq: Once | INTRAMUSCULAR | Status: AC
  Administered 2018-04-22: 0.5 mg via INTRAVENOUS

## 2018-04-22 MED ORDER — DIPHENHYDRAMINE HCL 50 MG/ML IJ SOLN
INTRAMUSCULAR | Status: AC
  Filled 2018-04-22: qty 1

## 2018-04-22 MED ORDER — DIPHENHYDRAMINE HCL 25 MG PO CAPS
ORAL_CAPSULE | ORAL | Status: AC
  Filled 2018-04-22: qty 1

## 2018-04-22 MED ORDER — SODIUM CHLORIDE 0.9 % IV SOLN
Freq: Once | INTRAVENOUS | Status: AC
  Administered 2018-04-22: 10:00:00 via INTRAVENOUS
  Filled 2018-04-22: qty 250

## 2018-04-22 MED ORDER — SODIUM CHLORIDE 0.9 % IV SOLN
375.0000 mg/m2 | Freq: Once | INTRAVENOUS | Status: AC
  Administered 2018-04-22: 700 mg via INTRAVENOUS
  Filled 2018-04-22: qty 50

## 2018-04-22 MED ORDER — DIPHENHYDRAMINE HCL 25 MG PO CAPS
50.0000 mg | ORAL_CAPSULE | Freq: Once | ORAL | Status: AC
  Administered 2018-04-22: 50 mg via ORAL

## 2018-04-22 MED ORDER — ACETAMINOPHEN 325 MG PO TABS
ORAL_TABLET | ORAL | Status: AC
  Filled 2018-04-22: qty 2

## 2018-04-22 MED ORDER — LORAZEPAM 2 MG/ML IJ SOLN
INTRAMUSCULAR | Status: AC
  Filled 2018-04-22: qty 1

## 2018-04-22 NOTE — Patient Instructions (Signed)

## 2018-04-22 NOTE — Progress Notes (Signed)
Hematology and Oncology Follow Up Visit  Morgan [name] [id number] Jan 26, 1966 51 y.o. 04/22/2018   Principle Diagnosis:  CLL  -- unmutated IgVH  Current Therapy:   Rituxan/Imbruvica - s/p cycle 2   Interim History: Morgan [name] is here today for follow-up and treatment. Her CT scan showed decrease in lymphadenopathy of the chest, abdomen and pelvis. No new or progressive lymphadenopathy and her right upper lobe and lingula nodules are stable.   She was diagnosed with a UTI. She had low pelvic pain with blood in her urine. She is on Cefdinir.  She is meeting with the chaplain and Dr. Ouida Sills regularly to help her work through her anxiety.  She has intermittent episodes of SOB and palpitations/angina. She states that she is seeing cardiology.  No fever, chills, n/v, cough, rash, dizziness, abdominal pain or changes in bowel habits.  She has some muscle and joint aches and swelling at times. The numbness and tingling in her hands and feet is unchanged.  She is walking 3-5 miles several days a week for exercise. This really helps her to relax.  She is eating well and staying hydrated. Her weight is stable.   ECOG Performance Status: 1 - Symptomatic but completely ambulatory  Medications:  Allergies as of 04/22/2018      Reactions   Aripiprazole Anaphylaxis, Swelling, Other (See Comments)   Throat begins to swell  Throat begins to swell  Throat closes Throat closes   Ciprofloxacin Shortness Of Breath   Lamotrigine Rash, Other (See Comments)   Nitrofurantoin Hives   Caused Hives   Peanut Oil Anaphylaxis, Swelling, Rash, Hives   Throat swelling also Throat swelling also Throat swelling   Amoxicillin Hives, Rash   Doxycycline Hives, Rash   Latex Rash, Other (See Comments)   Meloxicam Other (See Comments), Itching   Induces mania Induces mania Interacts with Lithium Interacts with Lithium   Naproxen Other (See Comments), Hives   Interacts with lithium Interacts with  lithium Interacts with Lithium Interacts with Lithium   Other Swelling, Rash, Hives   Throat swelling and rash to WALNUTS and PINE NUTS Throat swelling and rash to WALNUTS and PINE NUTS Throat swelling and rash to WALNUTS and PINE NUTS "steri strips"   Pramipexole Hives, Rash   Prednisone Other (See Comments)   Interacts with Lithium. " All steroids" Interacts with Lithium. " All steroids" Interacts with Lithium. " All steroids" Interacts with Lithium. " All steroids"   Risperidone Hives, Rash   Sulfa Antibiotics Rash   "that leaves scarring"   Tape Rash, Itching   Steri-Strips "steri strips"   Cortisone Other (See Comments)   Exacerbates the mania of her bipolar Exacerbates the mania of her bipolar Exacerbates the mania of her bipolar   Percocet [oxycodone-acetaminophen] Other (See Comments)   Severe dizziness      Medication List        Accurate as of 04/22/18  9:20 AM. Always use your most recent med list.          albuterol 108 (90 Base) MCG/ACT inhaler Commonly known as:  PROVENTIL HFA;VENTOLIN HFA Inhale 1-2 puffs into the lungs every 6 (six) hours as needed for wheezing or shortness of breath.   allopurinol 100 MG tablet Commonly known as:  ZYLOPRIM Take 1 tablet (100 mg total) by mouth daily.   BREO ELLIPTA 100-25 MCG/INH Aepb Generic drug:  fluticasone furoate-vilanterol Inhale 1 puff into the lungs daily as needed ("for flares").   cefdinir 300 MG capsule Commonly known as:  OMNICEF Take 1 capsule (300 mg total) by mouth 2 (two) times daily for 7 days.   EPINEPHrine 0.3 mg/0.3 mL Soaj injection Commonly known as:  EPI-PEN INJ 0.3 ML IM ONCE FOR 1 DOSE. U IN CASE OF ALLERGIC REACTION EMERGENCY   hyoscyamine 0.125 MG tablet Commonly known as:  LEVSIN, ANASPAZ Take 1 tablet (0.125 mg total) by mouth as needed for cramping. Reported on 09/24/2015   Ibrutinib 420 MG Tabs Take 420 mg by mouth daily.   VITAMIN D2 PO Take by mouth daily.        Allergies:  Allergies  Allergen Reactions   Aripiprazole Anaphylaxis, Swelling and Other (See Comments)    Throat begins to swell  Throat begins to swell  Throat closes Throat closes   Ciprofloxacin Shortness Of Breath   Lamotrigine Rash and Other (See Comments)   Nitrofurantoin Hives    Caused Hives   Peanut Oil Anaphylaxis, Swelling, Rash and Hives    Throat swelling also Throat swelling also Throat swelling   Amoxicillin Hives and Rash   Doxycycline Hives and Rash   Latex Rash and Other (See Comments)   Meloxicam Other (See Comments) and Itching    Induces mania Induces mania Interacts with Lithium Interacts with Lithium   Naproxen Other (See Comments) and Hives    Interacts with lithium Interacts with lithium Interacts with Lithium Interacts with Lithium   Other Swelling, Rash and Hives    Throat swelling and rash to WALNUTS and PINE NUTS Throat swelling and rash to WALNUTS and PINE NUTS Throat swelling and rash to WALNUTS and PINE NUTS "steri strips"   Pramipexole Hives and Rash   Prednisone Other (See Comments)    Interacts with Lithium. " All steroids" Interacts with Lithium. " All steroids" Interacts with Lithium. " All steroids" Interacts with Lithium. " All steroids"   Risperidone Hives and Rash   Sulfa Antibiotics Rash    "that leaves scarring"   Tape Rash and Itching    Steri-Strips "steri strips"   Cortisone Other (See Comments)    Exacerbates the mania of her bipolar Exacerbates the mania of her bipolar Exacerbates the mania of her bipolar   Percocet [Oxycodone-Acetaminophen] Other (See Comments)    Severe dizziness    Past Medical History, Surgical history, Social history, and Family History were reviewed and updated.  Review of Systems: All other 10 point review of systems is negative.   Physical Exam:  vitals were not taken for this visit.   Wt Readings from Last 3 Encounters:  04/15/18 157 lb 9.6 oz (71.5 kg)   04/06/18 157 lb 2 oz (71.3 kg)  03/25/18 156 lb (70.8 kg)    Ocular: Sclerae unicteric, pupils equal, round and reactive to light Ear-nose-throat: Oropharynx clear, dentition fair Lymphatic: No cervical, supraclavicular or axillary adenopathy Lungs no rales or rhonchi, good excursion bilaterally Heart regular rate and rhythm, no murmur appreciated Abd soft, nontender, positive bowel sounds, no liver or spleen tip palpated on exam, no fluid wave  MSK no focal spinal tenderness, no joint edema Neuro: non-focal, well-oriented, appropriate affect Breasts: Deferred   Lab Results  Component Value Date   WBC 9.4 04/22/2018   HGB 14.0 04/22/2018   HCT 43.5 04/22/2018   MCV 85.6 04/22/2018   PLT 273 04/22/2018   No results found for: FERRITIN, IRON, TIBC, UIBC, IRONPCTSAT Lab Results  Component Value Date   RBC 5.08 04/22/2018   No results found for: KPAFRELGTCHN, LAMBDASER, KAPLAMBRATIO Lab Results  Component Value  Date   IGGSERUM 567 (L) 03/25/2018   IGA 28 (L) 03/25/2018   IGMSERUM 13 (L) 03/25/2018   Lab Results  Component Value Date   TOTALPROTELP 6.0 03/25/2018   ALBUMINELP 4.0 03/25/2018   A1GS 0.2 03/25/2018   A2GS 0.6 03/25/2018   BETS 0.8 03/25/2018   GAMS 0.5 03/25/2018   MSPIKE Not Observed 03/25/2018     Chemistry      Component Value Date/Time   NA 146 (H) 03/25/2018 0844   NA 142 03/10/2017 0954   NA 142 09/04/2016 0754   K 3.7 03/25/2018 0844   K 4.2 03/10/2017 0954   K 4.0 09/04/2016 0754   CL 112 (H) 03/25/2018 0844   CL 111 (H) 03/10/2017 0954   CO2 27 03/25/2018 0844   CO2 28 03/10/2017 0954   CO2 25 09/04/2016 0754   BUN 11 03/25/2018 0844   BUN 11 03/10/2017 0954   BUN 13.4 09/04/2016 0754   CREATININE 0.80 03/25/2018 0844   CREATININE 1.1 03/10/2017 0954   CREATININE 0.8 09/04/2016 0754      Component Value Date/Time   CALCIUM 9.7 03/25/2018 0844   CALCIUM 9.6 03/10/2017 0954   CALCIUM 9.7 09/04/2016 0754   ALKPHOS 49 03/25/2018  0844   ALKPHOS 46 03/10/2017 0954   ALKPHOS 54 09/04/2016 0754   AST 23 03/25/2018 0844   AST 20 09/04/2016 0754   ALT 13 03/25/2018 0844   ALT 20 03/10/2017 0954   ALT 14 09/04/2016 0754   BILITOT 0.8 03/25/2018 0844   BILITOT 0.39 09/04/2016 0754      Impression and Plan: Morgan [name] is a very pleasant 52 yo caucasian female with CLL - unmutated IgVH. Her CT scan last week showed a nice response to treatment.  We will proceed with cycle 3 today as planned.  We will see her back in another months.  She will contact our office with any questions or concerns. We can certainly see her sooner if need be.   Laverna Peace, NP 10/4/20199:20 AM

## 2018-04-23 LAB — IGG, IGA, IGM
IGG (IMMUNOGLOBIN G), SERUM: 586 mg/dL — AB (ref 700–1600)
IgA: 33 mg/dL — ABNORMAL LOW (ref 87–352)
IgM (Immunoglobulin M), Srm: 16 mg/dL — ABNORMAL LOW (ref 26–217)

## 2018-04-25 LAB — PROTEIN ELECTROPHORESIS, SERUM, WITH REFLEX
A/G Ratio: 1.6 (ref 0.7–1.7)
ALPHA-2-GLOBULIN: 0.8 g/dL (ref 0.4–1.0)
Albumin ELP: 4 g/dL (ref 2.9–4.4)
Alpha-1-Globulin: 0.2 g/dL (ref 0.0–0.4)
BETA GLOBULIN: 0.9 g/dL (ref 0.7–1.3)
GAMMA GLOBULIN: 0.5 g/dL (ref 0.4–1.8)
GLOBULIN, TOTAL: 2.5 g/dL (ref 2.2–3.9)
Total Protein ELP: 6.5 g/dL (ref 6.0–8.5)

## 2018-04-25 LAB — KAPPA/LAMBDA LIGHT CHAINS
KAPPA FREE LGHT CHN: 13.3 mg/L (ref 3.3–19.4)
Kappa, lambda light chain ratio: 1.73 — ABNORMAL HIGH (ref 0.26–1.65)
LAMDA FREE LIGHT CHAINS: 7.7 mg/L (ref 5.7–26.3)

## 2018-04-25 NOTE — Progress Notes (Addendum)
[facility] [address], [place], [place] [address] [phone]  Date:  04/27/2018   Name:  Morgan [name]   DOB:  04-11-1966   MRN:  [id number]  PCP:  Darreld Mclean, MD    Chief Complaint: Annual Exam (With pap) and Flu Vaccine (unsure if she would like one)   History of Present Illness:  Morgan [name] is a 52 y.o. very pleasant female patient who presents with the following:  Here today for a CPE History of CLL - she has sought several opinions but I think she ultimatly decided to continue to get her care with Dr. Marin Olp Just saw Judson Roch last week for treatment: Interim History: [name] is here today for follow-up and treatment. Her CT scan showed decrease in lymphadenopathy of the chest, abdomen and pelvis. No new or progressive lymphadenopathy and her right upper lobe and lingula nodules are stable.   She was diagnosed with a UTI. She had low pelvic pain with blood in her urine. She is on Cefdinir.  She is meeting with the chaplain and Dr. Ouida Sills regularly to help her work through her anxiety.  She has intermittent episodes of SOB and palpitations/angina. She states that she is seeing cardiology.  No fever, chills, n/v, cough, rash, dizziness, abdominal pain or changes in bowel habits.  She has some muscle and joint aches and swelling at times. The numbness and tingling in her hands and feet is unchanged.  She is walking 3-5 miles several days a week for exercise. This really helps her to relax.  She is eating well and staying hydrated. Her weight is stable. ////////////////////////////////////////////////// We will proceed with cycle 3 today as planned.  We will see her back in another months  Mammo: 2/18, encouraged her to have this done  Pap: due- however she is s/p hyst. This was done in 2010- done for bleeding, no history of GYN cancer. She has one ovary. She is not 100% sure if she has a cervix or not.   We will find out for her today  Labs: she could use a lipid panel  Immun: discussed flu- she would like to have this shot done   Colon: 3/17 She has recently noted a HA with sexual activity- this has happened a couple of times with masturbation Last occurred a few days ago She may have occasional more run of the mill headaches but this was different and more severe  No HA now, resolved on its own   She has noted toenail discoloration for a long time  She will show them to her derm coming up soon   No urinary symptoms   Patient Active Problem List   Diagnosis Date Noted   Dysfunction of right eustachian tube 12/03/2017   Piriformis syndrome of right side 04/22/2017   Lateral epicondylitis of left elbow 04/06/2017   Solitary pulmonary nodule 10/29/2016   Chronic lymphocytic leukemia ([place]) 07/31/2016   Mass in neck 04/17/2016   Abdominal mass 04/17/2016   Asthma 12/12/2015   Multiple environmental allergies 12/12/2015   Chronic lower back pain 09/12/2015   Bipolar 1 disorder, depressed ([place]) 08/27/2015    Class: Chronic   Other specified hypothyroidism 08/23/2015   Borderline personality disorder ([place]) 08/15/2015   Severe benzodiazepine use disorder ([place]) 08/15/2015   Alcohol use disorder, moderate, dependence ([place]) 08/15/2015   PTSD (post-traumatic stress disorder) 08/15/2015   History of bipolar disorder 08/15/2015   Pre-syncope 05/17/2015  Skull deformity 05/17/2015   Patellofemoral syndrome of both knees 05/02/2015    Past Medical History:  Diagnosis Date   Anxiety    Arthritis    Asthma    Bipolar disorder ([place])    Bulging lumbar disc 07/19/05   Chronic headaches    Chronic lymphocytic leukemia ([place]) 07/31/2016   Colon polyps    Degenerative disorder of bone    Depression    History of borderline personality disorder    Hypothyroidism 2007   Developed after use of Lithium   IBS (irritable bowel syndrome) 1995   Pneumonia     Proctitis    Substance abuse ([place])    ativan last month    Past Surgical History:  Procedure Laterality Date   ABDOMINAL HYSTERECTOMY     APPENDECTOMY     BUNIONECTOMY Right 06/2012   CHOLECYSTECTOMY     OOPHORECTOMY     SHOULDER OPEN ROTATOR CUFF REPAIR Right 03/2010   TUBAL LIGATION      Social History   Tobacco Use   Smoking status: Former Smoker    Last attempt to quit: 07/20/1980    Years since quitting: 37.7   Smokeless tobacco: Never Used  Substance Use Topics   Alcohol use: No    Alcohol/week: 0.0 standard drinks    Comment: unsure   Drug use: Yes    Types: Benzodiazepines    Family History  Problem Relation Age of Onset   Depression Mother    Hypertension Mother    Post-traumatic stress disorder Sister    Alcohol abuse Brother    Drug abuse Brother    Post-traumatic stress disorder Brother    Thyroid disease Father    Alzheimer's disease Father    COPD Maternal Grandmother    Heart disease Maternal Grandfather    Arthritis Paternal Grandmother    Diabetes Paternal Grandmother    Depression Paternal Grandmother    Alzheimer's disease Paternal Grandmother    Heart disease Paternal Grandfather    Coronary artery disease Paternal Grandfather    Alcohol abuse Paternal Grandfather    Colon cancer Neg Hx    Breast cancer Neg Hx     Allergies  Allergen Reactions   Aripiprazole Anaphylaxis, Swelling and Other (See Comments)    Throat begins to swell  Throat begins to swell  Throat closes Throat closes   Ciprofloxacin Shortness Of Breath   Lamotrigine Rash and Other (See Comments)   Nitrofurantoin Hives    Caused Hives   Peanut Oil Anaphylaxis, Swelling, Rash and Hives    Throat swelling also Throat swelling also Throat swelling   Amoxicillin Hives and Rash   Doxycycline Hives and Rash   Latex Rash and Other (See Comments)   Meloxicam Other (See Comments) and Itching    Induces mania Induces  mania Interacts with Lithium Interacts with Lithium   Naproxen Other (See Comments) and Hives    Interacts with lithium Interacts with lithium Interacts with Lithium Interacts with Lithium   Other Swelling, Rash and Hives    Throat swelling and rash to WALNUTS and PINE NUTS Throat swelling and rash to WALNUTS and PINE NUTS Throat swelling and rash to WALNUTS and PINE NUTS "steri strips"   Pramipexole Hives and Rash   Prednisone Other (See Comments)    Interacts with Lithium. " All steroids" Interacts with Lithium. " All steroids" Interacts with Lithium. " All steroids" Interacts with Lithium. " All steroids"   Risperidone Hives and Rash   Sulfa Antibiotics Rash    "  that leaves scarring"   Tape Rash and Itching    Steri-Strips "steri strips"   Cortisone Other (See Comments)    Exacerbates the mania of her bipolar Exacerbates the mania of her bipolar Exacerbates the mania of her bipolar   Percocet [Oxycodone-Acetaminophen] Other (See Comments)    Severe dizziness    Medication list has been reviewed and updated.  Current Outpatient Medications on File Prior to Visit  Medication Sig Dispense Refill   albuterol (PROVENTIL HFA;VENTOLIN HFA) 108 (90 Base) MCG/ACT inhaler Inhale 1-2 puffs into the lungs every 6 (six) hours as needed for wheezing or shortness of breath. 1 each 3   allopurinol (ZYLOPRIM) 100 MG tablet Take 1 tablet (100 mg total) by mouth daily. 30 tablet 0   EPINEPHrine 0.3 mg/0.3 mL IJ SOAJ injection INJ 0.3 ML IM ONCE FOR 1 DOSE. U IN CASE OF ALLERGIC REACTION EMERGENCY     Ergocalciferol (VITAMIN D2 PO) Take by mouth daily.     fluticasone furoate-vilanterol (BREO ELLIPTA) 100-25 MCG/INH AEPB Inhale 1 puff into the lungs daily as needed ("for flares").      hyoscyamine (LEVSIN, ANASPAZ) 0.125 MG tablet Take 1 tablet (0.125 mg total) by mouth as needed for cramping. Reported on 09/24/2015 10 tablet 2   Ibrutinib (IMBRUVICA) 420 MG TABS Take 420 mg  by mouth daily. 30 tablet 12   No current facility-administered medications on file prior to visit.     Review of Systems:  As per HPI- otherwise negative. No fever or chills No CP or SOB    Physical Examination: Vitals:   04/27/18 1231  BP: 122/74  Pulse: 80  Resp: 16  Temp: 97.8 F (36.6 C)  SpO2: 98%   Vitals:   04/27/18 1231  Weight: 159 lb 9.6 oz (72.4 kg)  Height: 5' 3.5" (1.613 m)   Body mass index is 27.83 kg/m. Ideal Body Weight: Weight in (lb) to have BMI = 25: 143.1  GEN: WDWN, NAD, Non-toxic, A & O x 3, looks well  HEENT: Atraumatic, Normocephalic. Neck supple. No masses, No LAD. Bilateral TM wnl, oropharynx normal.  PEERL,EOMI.   Ears and Nose: No external deformity. CV: RRR, No M/G/R. No JVD. No thrill. No extra heart sounds. PULM: CTA B, no wheezes, crackles, rhonchi. No retractions. No resp. distress. No accessory muscle use. ABD: S, NT, ND, +BS. No rebound. No HSM. EXTR: No c/c/e NEURO Normal gait.  Normal strength and movement of all limbs and facial muscles today  PSYCH: Normally interactive. Conversant. Not depressed or anxious appearing.  Calm demeanor.  Breast: normal exam, no masses/ dimpling/ discharge Pelvic: normal, no vaginal lesions or discharge. No adnexal tendereness or masses Cervix and uterus removed c/w hysterectomy Collected pap of vaginal cuff   Assessment and Plan: Physical exam  CLL (chronic lymphocytic leukemia) ([place])  History of anemia  Screening for diabetes mellitus - Plan: Hemoglobin A1c  Anxiety  Screening for cervical cancer - Plan: Cytology - PAP  Screening for breast cancer  Thyroid disorder - Plan: TSH  Screening for hyperlipidemia - Plan: Lipid panel  Headache associated with sexual activity - Plan: Ambulatory referral to Neurology  CPE  Pap of vaginal cuff- advised pt that she does not have a cervix and thus does not necessarily have to undergo pap in the future, but can choose to do so every 5 years  or so if she likes Labs pending as above HA with sexual activity- referral to neurology for consultation.  She may need an  MRI but would prefer to discuss with neurology prior to incurring this expense   Signed Lamar Blinks, MD  Received her labs so far  Results for orders placed or performed in visit on 04/27/18  TSH  Result Value Ref Range   TSH 8.67 (H) 0.35 - 4.50 uIU/mL  Lipid panel  Result Value Ref Range   Cholesterol 241 (H) 0 - 200 mg/dL   Triglycerides 171.0 (H) 0.0 - 149.0 mg/dL   HDL 104.30 >39.00 mg/dL   VLDL 34.2 0.0 - 40.0 mg/dL   LDL Cholesterol 103 (H) 0 - 99 mg/dL   Total CHOL/HDL Ratio 2    NonHDL 137.00   Hemoglobin A1c  Result Value Ref Range   Hgb A1c MFr Bld 5.5 4.6 - 6.5 %   Your A1c looks good, no sign of diabetes It looks like you need to be on a thyroid replacement medication.  I had thought that you were on levothyroxine?  Would it be ok if I put you back on this medication? Although your total cholesterol is a bit high, you have a great HDL and I calculated your estimated 10 year risk of cardiovascular disease at 3.5% which is reassuring.  No need to start on cholesterol med at this time   Also received her pap 10/11  Adequacy Satisfactory for evaluation endocervical/transformation zone component PRESENT.   Diagnosis NEGATIVE FOR INTRAEPITHELIAL LESIONS OR MALIGNANCY.   HPV NOT DETECTED   Comment: Normal Reference Range - NOT Detected  Material Submitted CervicoVaginal Pap [ThinPrep Imaged]   CYTOLOGY - PAP PAP RESULT    Message to pt

## 2018-04-26 ENCOUNTER — Encounter: Payer: Self-pay | Admitting: Hematology & Oncology

## 2018-04-27 ENCOUNTER — Encounter: Payer: Self-pay | Admitting: Family Medicine

## 2018-04-27 ENCOUNTER — Other Ambulatory Visit (HOSPITAL_COMMUNITY)
Admission: RE | Admit: 2018-04-27 | Discharge: 2018-04-27 | Disposition: A | Discharge status: Home / Self Care | Payer: Medicare Other | Source: Ambulatory Visit | Attending: Family Medicine | Admitting: Family Medicine

## 2018-04-27 ENCOUNTER — Other Ambulatory Visit: Payer: Self-pay | Admitting: Family

## 2018-04-27 ENCOUNTER — Ambulatory Visit (INDEPENDENT_AMBULATORY_CARE_PROVIDER_SITE_OTHER): Payer: Medicare Other | Admitting: Family Medicine

## 2018-04-27 ENCOUNTER — Ambulatory Visit (INDEPENDENT_AMBULATORY_CARE_PROVIDER_SITE_OTHER): Payer: Medicare Other | Admitting: Psychiatry

## 2018-04-27 VITALS — BP 122/74 | HR 80 | Temp 97.8°F | Resp 16 | Ht 63.5 in | Wt 159.6 lb

## 2018-04-27 DIAGNOSIS — E079 Disorder of thyroid, unspecified: Secondary | ICD-10-CM | POA: Diagnosis not present

## 2018-04-27 DIAGNOSIS — F419 Anxiety disorder, unspecified: Secondary | ICD-10-CM

## 2018-04-27 DIAGNOSIS — Z13220 Encounter for screening for lipoid disorders: Secondary | ICD-10-CM

## 2018-04-27 DIAGNOSIS — Z124 Encounter for screening for malignant neoplasm of cervix: Secondary | ICD-10-CM | POA: Diagnosis not present

## 2018-04-27 DIAGNOSIS — F4310 Post-traumatic stress disorder, unspecified: Secondary | ICD-10-CM | POA: Diagnosis not present

## 2018-04-27 DIAGNOSIS — C9110 Chronic lymphocytic leukemia of B-cell type not having achieved remission: Secondary | ICD-10-CM | POA: Diagnosis not present

## 2018-04-27 DIAGNOSIS — Z0000 Encounter for general adult medical examination without abnormal findings: Secondary | ICD-10-CM | POA: Diagnosis not present

## 2018-04-27 DIAGNOSIS — Z131 Encounter for screening for diabetes mellitus: Secondary | ICD-10-CM | POA: Diagnosis not present

## 2018-04-27 DIAGNOSIS — Z862 Personal history of diseases of the blood and blood-forming organs and certain disorders involving the immune mechanism: Secondary | ICD-10-CM | POA: Diagnosis not present

## 2018-04-27 DIAGNOSIS — Z1239 Encounter for other screening for malignant neoplasm of breast: Secondary | ICD-10-CM

## 2018-04-27 DIAGNOSIS — F3173 Bipolar disorder, in partial remission, most recent episode manic: Secondary | ICD-10-CM | POA: Diagnosis not present

## 2018-04-27 DIAGNOSIS — G4482 Headache associated with sexual activity: Secondary | ICD-10-CM

## 2018-04-27 LAB — LIPID PANEL
Cholesterol: 241 mg/dL — ABNORMAL HIGH (ref 0–200)
HDL: 104.3 mg/dL (ref >39.00)
LDL Cholesterol: 103 mg/dL — ABNORMAL HIGH (ref 0–99)
NonHDL: 137
Total CHOL/HDL Ratio: 2
Triglycerides: 171 mg/dL — ABNORMAL HIGH (ref 0.0–149.0)
VLDL: 34.2 mg/dL (ref 0.0–40.0)

## 2018-04-27 LAB — HEMOGLOBIN A1C: HEMOGLOBIN A1C: 5.5 % (ref 4.6–6.5)

## 2018-04-27 LAB — TSH: TSH: 8.67 u[IU]/mL — AB (ref 0.35–4.50)

## 2018-04-27 NOTE — Patient Instructions (Addendum)
It was good to see you today! I will be in touch with your labs and pap asap Assuming your pap is ok you do not need to do one again for 5 yeas or so- if ever, as you don't have a cervix  Please show your toenails to your dermatologist the next time you are in  I will set you up to see neurology to discuss the headaches that you have noticed   I would advise you to have a screening mammogram   Health Maintenance, Female Adopting a healthy lifestyle and getting preventive care can go a long way to promote health and wellness. Talk with your health care provider about what schedule of regular examinations is right for you. This is a good chance for you to check in with your provider about disease prevention and staying healthy. In between checkups, there are plenty of things you can do on your own. Experts have done a lot of research about which lifestyle changes and preventive measures are most likely to keep you healthy. Ask your health care provider for more information. Weight and diet Eat a healthy diet  Be sure to include plenty of vegetables, fruits, low-fat dairy products, and lean protein.  Do not eat a lot of foods high in solid fats, added sugars, or salt.  Get regular exercise. This is one of the most important things you can do for your health. ? Most adults should exercise for at least 150 minutes each week. The exercise should increase your heart rate and make you sweat (moderate-intensity exercise). ? Most adults should also do strengthening exercises at least twice a week. This is in addition to the moderate-intensity exercise.  Maintain a healthy weight  Body mass index (BMI) is a measurement that can be used to identify possible weight problems. It estimates body fat based on height and weight. Your health care provider can help determine your BMI and help you achieve or maintain a healthy weight.  For females 53 years of age and older: ? A BMI below 18.5 is considered  underweight. ? A BMI of 18.5 to 24.9 is normal. ? A BMI of 25 to 29.9 is considered overweight. ? A BMI of 30 and above is considered obese.  Watch levels of cholesterol and blood lipids  You should start having your blood tested for lipids and cholesterol at 53 years of age, then have this test every 5 years.  You may need to have your cholesterol levels checked more often if: ? Your lipid or cholesterol levels are high. ? You are older than 53 years of age. ? You are at high risk for heart disease.  Cancer screening Lung Cancer  Lung cancer screening is recommended for adults 50-70 years old who are at high risk for lung cancer because of a history of smoking.  A yearly low-dose CT scan of the lungs is recommended for people who: ? Currently smoke. ? Have quit within the past 15 years. ? Have at least a 30-pack-year history of smoking. A pack year is smoking an average of one pack of cigarettes a day for 1 year.  Yearly screening should continue until it has been 15 years since you quit.  Yearly screening should stop if you develop a health problem that would prevent you from having lung cancer treatment.  Breast Cancer  Practice breast self-awareness. This means understanding how your breasts normally appear and feel.  It also means doing regular breast self-exams. Let your health care  provider know about any changes, no matter how small.  If you are in your 20s or 30s, you should have a clinical breast exam (CBE) by a health care provider every 1-3 years as part of a regular health exam.  If you are 63 or older, have a CBE every year. Also consider having a breast X-ray (mammogram) every year.  If you have a family history of breast cancer, talk to your health care provider about genetic screening.  If you are at high risk for breast cancer, talk to your health care provider about having an MRI and a mammogram every year.  Breast cancer gene (BRCA) assessment is  recommended for women who have family members with BRCA-related cancers. BRCA-related cancers include: ? Breast. ? Ovarian. ? Tubal. ? Peritoneal cancers.  Results of the assessment will determine the need for genetic counseling and BRCA1 and BRCA2 testing.  Cervical Cancer Your health care provider may recommend that you be screened regularly for cancer of the pelvic organs (ovaries, uterus, and vagina). This screening involves a pelvic examination, including checking for microscopic changes to the surface of your cervix (Pap test). You may be encouraged to have this screening done every 3 years, beginning at age 53.  For women ages 43-65, health care providers may recommend pelvic exams and Pap testing every 3 years, or they may recommend the Pap and pelvic exam, combined with testing for human papilloma virus (HPV), every 5 years. Some types of HPV increase your risk of cervical cancer. Testing for HPV may also be done on women of any age with unclear Pap test results.  Other health care providers may not recommend any screening for nonpregnant women who are considered low risk for pelvic cancer and who do not have symptoms. Ask your health care provider if a screening pelvic exam is right for you.  If you have had past treatment for cervical cancer or a condition that could lead to cancer, you need Pap tests and screening for cancer for at least 20 years after your treatment. If Pap tests have been discontinued, your risk factors (such as having a new sexual partner) need to be reassessed to determine if screening should resume. Some women have medical problems that increase the chance of getting cervical cancer. In these cases, your health care provider may recommend more frequent screening and Pap tests.  Colorectal Cancer  This type of cancer can be detected and often prevented.  Routine colorectal cancer screening usually begins at 53 years of age and continues through 53 years of  age.  Your health care provider may recommend screening at an earlier age if you have risk factors for colon cancer.  Your health care provider may also recommend using home test kits to check for hidden blood in the stool.  A small camera at the end of a tube can be used to examine your colon directly (sigmoidoscopy or colonoscopy). This is done to check for the earliest forms of colorectal cancer.  Routine screening usually begins at age 64.  Direct examination of the colon should be repeated every 5-10 years through 53 years of age. However, you may need to be screened more often if early forms of precancerous polyps or small growths are found.  Skin Cancer  Check your skin from head to toe regularly.  Tell your health care provider about any new moles or changes in moles, especially if there is a change in a mole's shape or color.  Also tell your  health care provider if you have a mole that is larger than the size of a pencil eraser.  Always use sunscreen. Apply sunscreen liberally and repeatedly throughout the day.  Protect yourself by wearing long sleeves, pants, a wide-brimmed hat, and sunglasses whenever you are outside.  Heart disease, diabetes, and high blood pressure  High blood pressure causes heart disease and increases the risk of stroke. High blood pressure is more likely to develop in: ? People who have blood pressure in the high end of the normal range (130-139/85-89 mm Hg). ? People who are overweight or obese. ? People who are African American.  If you are 38-107 years of age, have your blood pressure checked every 3-5 years. If you are 37 years of age or older, have your blood pressure checked every year. You should have your blood pressure measured twice-once when you are at a hospital or clinic, and once when you are not at a hospital or clinic. Record the average of the two measurements. To check your blood pressure when you are not at a hospital or clinic, you  can use: ? An automated blood pressure machine at a pharmacy. ? A home blood pressure monitor.  If you are between 73 years and 77 years old, ask your health care provider if you should take aspirin to prevent strokes.  Have regular diabetes screenings. This involves taking a blood sample to check your fasting blood sugar level. ? If you are at a normal weight and have a low risk for diabetes, have this test once every three years after 53 years of age. ? If you are overweight and have a high risk for diabetes, consider being tested at a younger age or more often. Preventing infection Hepatitis B  If you have a higher risk for hepatitis B, you should be screened for this virus. You are considered at high risk for hepatitis B if: ? You were born in a country where hepatitis B is common. Ask your health care provider which countries are considered high risk. ? Your parents were born in a high-risk country, and you have not been immunized against hepatitis B (hepatitis B vaccine). ? You have HIV or AIDS. ? You use needles to inject street drugs. ? You live with someone who has hepatitis B. ? You have had sex with someone who has hepatitis B. ? You get hemodialysis treatment. ? You take certain medicines for conditions, including cancer, organ transplantation, and autoimmune conditions.  Hepatitis C  Blood testing is recommended for: ? Everyone born from [birth year] through 1966. ? Anyone with known risk factors for hepatitis C.  Sexually transmitted infections (STIs)  You should be screened for sexually transmitted infections (STIs) including gonorrhea and chlamydia if: ? You are sexually active and are younger than 53 years of age. ? You are older than 53 years of age and your health care provider tells you that you are at risk for this type of infection. ? Your sexual activity has changed since you were last screened and you are at an increased risk for chlamydia or gonorrhea. Ask your  health care provider if you are at risk.  If you do not have HIV, but are at risk, it may be recommended that you take a prescription medicine daily to prevent HIV infection. This is called pre-exposure prophylaxis (PrEP). You are considered at risk if: ? You are sexually active and do not regularly use condoms or know the HIV status of your partner(s). ?  You take drugs by injection. ? You are sexually active with a partner who has HIV.  Talk with your health care provider about whether you are at high risk of being infected with HIV. If you choose to begin PrEP, you should first be tested for HIV. You should then be tested every 3 months for as long as you are taking PrEP. Pregnancy  If you are premenopausal and you may become pregnant, ask your health care provider about preconception counseling.  If you may become pregnant, take 400 to 800 micrograms (mcg) of folic acid every day.  If you want to prevent pregnancy, talk to your health care provider about birth control (contraception). Osteoporosis and menopause  Osteoporosis is a disease in which the bones lose minerals and strength with aging. This can result in serious bone fractures. Your risk for osteoporosis can be identified using a bone density scan.  If you are 67 years of age or older, or if you are at risk for osteoporosis and fractures, ask your health care provider if you should be screened.  Ask your health care provider whether you should take a calcium or vitamin D supplement to lower your risk for osteoporosis.  Menopause may have certain physical symptoms and risks.  Hormone replacement therapy may reduce some of these symptoms and risks. Talk to your health care provider about whether hormone replacement therapy is right for you. Follow these instructions at home:  Schedule regular health, dental, and eye exams.  Stay current with your immunizations.  Do not use any tobacco products including cigarettes, chewing  tobacco, or electronic cigarettes.  If you are pregnant, do not drink alcohol.  If you are breastfeeding, limit how much and how often you drink alcohol.  Limit alcohol intake to no more than 1 drink per day for nonpregnant women. One drink equals 12 ounces of beer, 5 ounces of wine, or 1 ounces of hard liquor.  Do not use street drugs.  Do not share needles.  Ask your health care provider for help if you need support or information about quitting drugs.  Tell your health care provider if you often feel depressed.  Tell your health care provider if you have ever been abused or do not feel safe at home. This information is not intended to replace advice given to you by your health care provider. Make sure you discuss any questions you have with your health care provider. Document Released: 01/19/2011 Document Revised: 12/12/2015 Document Reviewed: 04/09/2015 Elsevier Interactive Patient Education  [employer].

## 2018-04-28 ENCOUNTER — Telehealth (INDEPENDENT_AMBULATORY_CARE_PROVIDER_SITE_OTHER): Payer: Medicare Other

## 2018-04-28 DIAGNOSIS — Z23 Encounter for immunization: Secondary | ICD-10-CM | POA: Diagnosis not present

## 2018-04-28 LAB — CYTOLOGY - PAP
DIAGNOSIS: NEGATIVE
HPV: NOT DETECTED

## 2018-04-28 NOTE — Telephone Encounter (Signed)
Patient got the egg free flu shot. I must have forgot to document. Will order and document now.

## 2018-04-28 NOTE — Telephone Encounter (Signed)
-----   Message from Darreld Mclean, MD sent at 04/27/2018  9:32 PM EDT ----- Did she end up declining the flu shot?

## 2018-04-29 ENCOUNTER — Encounter: Payer: Self-pay | Admitting: General Practice

## 2018-04-29 ENCOUNTER — Encounter: Payer: Self-pay | Admitting: Family Medicine

## 2018-04-29 DIAGNOSIS — E079 Disorder of thyroid, unspecified: Secondary | ICD-10-CM

## 2018-04-29 MED ORDER — LEVOTHYROXINE SODIUM 25 MCG PO CAPS: 25.0000 ug | ORAL_CAPSULE | Freq: Every day | ORAL | 3 refills | Status: DC

## 2018-04-29 NOTE — Progress Notes (Signed)
[place] Spiritual Care Note  Received VM and email from pt, just processing immediate feelings. Unable to reach her by phone, responded to email instead with encouragement and notes re availability to f/u.   [place], [place], [facility] Pager [phone] Voicemail [phone]

## 2018-05-02 ENCOUNTER — Encounter: Payer: Self-pay | Admitting: Family

## 2018-05-04 ENCOUNTER — Ambulatory Visit (INDEPENDENT_AMBULATORY_CARE_PROVIDER_SITE_OTHER): Payer: Medicare Other | Admitting: Psychiatry

## 2018-05-04 DIAGNOSIS — F4310 Post-traumatic stress disorder, unspecified: Secondary | ICD-10-CM | POA: Diagnosis not present

## 2018-05-04 DIAGNOSIS — F3173 Bipolar disorder, in partial remission, most recent episode manic: Secondary | ICD-10-CM

## 2018-05-04 NOTE — Progress Notes (Signed)
Subjective:   Morgan [name] is a 52 y.o. female who presents for Medicare Annual (Subsequent) preventive examination.  Pt is struggling with acceptance of Leukemia/ treatment. Pt is worried about how it's affecting her mental health. She is seeing Dr.Anderson and talking to chaplain.  Pt has a daughter and son in their 46's.  Review of Systems:  No ROS.  Medicare Wellness Visit. Additional risk factors are reflected in the social history. Cardiac Risk Factors include: advanced age (>32men, >80 women) Sleep patterns: it varies Home Safety/Smoke Alarms: Feels safe in home. Smoke alarms in place. Lives on 2nd floor apt. Does well with stairs.126/  Female:   Pap- 04/27/18- normal      Mammo-   utd    Dexa scan- n/a       CCS- last normal. Next due 09/2025     Objective:     Vitals: BP 126/80 (BP Location: Left Arm, Patient Position: Sitting, Cuff Size: Normal)   Pulse 75   Ht 5\' 4"  (1.626 m)   Wt 163 lb 9.6 oz (74.2 kg)   SpO2 98%   BMI 28.08 kg/m   Body mass index is 28.08 kg/m.  Advanced Directives 05/05/2018 03/25/2018 03/09/2018 02/18/2018 04/19/2017 03/10/2017 12/10/2016  Does Patient Have a Medical Advance Directive? Yes Yes Yes Yes No No Yes  Type of Paramedic of [place];Living will [place];Living will [place];Living will [place];Living will - - [place];Living will  Copy of [place] in Chart? Yes - - Yes - - -  Would patient like information on creating a medical advance directive? - - - - - - -  Some encounter information is confidential and restricted. Go to Review Flowsheets activity to see all data.    Tobacco Social History   Tobacco Use  Smoking Status Former Smoker  . Last attempt to quit: 07/20/1980  . Years since quitting: 37.8  Smokeless Tobacco Never Used     Counseling given: Not Answered   Clinical Intake: Pain : No/denies  pain    Past Medical History:  Diagnosis Date  . Anxiety   . Arthritis   . Asthma   . Bipolar disorder ([place])   . Bulging lumbar disc 07/19/05  . Chronic headaches   . Chronic lymphocytic leukemia ([place]) 07/31/2016  . Colon polyps   . Degenerative disorder of bone   . Depression   . History of borderline personality disorder   . Hypothyroidism 2007   Developed after use of Lithium  . IBS (irritable bowel syndrome) 1995  . Pneumonia   . Proctitis   . Substance abuse ([place])    ativan last month   Past Surgical History:  Procedure Laterality Date  . ABDOMINAL HYSTERECTOMY    . APPENDECTOMY    . BUNIONECTOMY Right 06/2012  . CHOLECYSTECTOMY    . OOPHORECTOMY    . SHOULDER OPEN ROTATOR CUFF REPAIR Right 03/2010  . TUBAL LIGATION     Family History  Problem Relation Age of Onset  . Depression Mother   . Hypertension Mother   . Post-traumatic stress disorder Sister   . Alcohol abuse Brother   . Drug abuse Brother   . Post-traumatic stress disorder Brother   . Thyroid disease Father   . Alzheimer's disease Father   . COPD Maternal Grandmother   . Heart disease Maternal Grandfather   . Arthritis Paternal Grandmother   . Diabetes Paternal Grandmother   .  Depression Paternal Grandmother   . Alzheimer's disease Paternal Grandmother   . Heart disease Paternal Grandfather   . Coronary artery disease Paternal Grandfather   . Alcohol abuse Paternal Grandfather   . Colon cancer Neg Hx   . Breast cancer Neg Hx    Social History   Socioeconomic History  . Marital status: Divorced    Spouse name: Not on file  . Number of children: 2  . Years of education: 35  . Highest education level: Not on file  Occupational History  . Occupation: Unemployed  Social Needs  . Financial resource strain: Not on file  . Food insecurity:    Worry: Not on file    Inability: Not on file  . Transportation needs:    Medical: Not on file    Non-medical: Not on file  Tobacco Use  . Smoking  status: Former Smoker    Last attempt to quit: 07/20/1980    Years since quitting: 37.8  . Smokeless tobacco: Never Used  Substance and Sexual Activity  . Alcohol use: No    Alcohol/week: 0.0 standard drinks    Comment: unsure  . Drug use: Yes    Types: Benzodiazepines  . Sexual activity: Never    Birth control/protection: Surgical    Comment: intercourse age unknown,sexual partners less than 5  Lifestyle  . Physical activity:    Days per week: Not on file    Minutes per session: Not on file  . Stress: Not on file  Relationships  . Social connections:    Talks on phone: Not on file    Gets together: Not on file    Attends religious service: Not on file    Active member of club or organization: Not on file    Attends meetings of clubs or organizations: Not on file    Relationship status: Not on file  Other Topics Concern  . Not on file  Social History Narrative   Fun: [place], travel, music, writing, walking, hiking, volunteering   Denies religious beliefs effecting health care.    Feels safe at home.     Outpatient Encounter Medications as of 05/05/2018  Medication Sig  . albuterol (PROVENTIL HFA;VENTOLIN HFA) 108 (90 Base) MCG/ACT inhaler Inhale 1-2 puffs into the lungs every 6 (six) hours as needed for wheezing or shortness of breath.  . Ergocalciferol (VITAMIN D2 PO) Take by mouth daily.  . fluticasone furoate-vilanterol (BREO ELLIPTA) 100-25 MCG/INH AEPB Inhale 1 puff into the lungs daily as needed ("for flares").   . hyoscyamine (LEVSIN, ANASPAZ) 0.125 MG tablet Take 1 tablet (0.125 mg total) by mouth as needed for cramping. Reported on 09/24/2015  . Ibrutinib (IMBRUVICA) 420 MG TABS Take 420 mg by mouth daily.  Marland Kitchen EPINEPHrine 0.3 mg/0.3 mL IJ SOAJ injection INJ 0.3 ML IM ONCE FOR 1 DOSE. U IN CASE OF ALLERGIC REACTION EMERGENCY  . Levothyroxine Sodium 25 MCG CAPS Take 1 capsule (25 mcg total) by mouth daily before breakfast. (Patient not taking: Reported on 05/05/2018)  .  [DISCONTINUED] allopurinol (ZYLOPRIM) 100 MG tablet Take 1 tablet (100 mg total) by mouth daily.   No facility-administered encounter medications on file as of 05/05/2018.     Activities of Daily Living In your present state of health, do you have any difficulty performing the following activities: 05/05/2018 10/07/2017  Hearing? N N  Vision? N N  Difficulty concentrating or making decisions? N N  Walking or climbing stairs? N N  Dressing or bathing? N N  Doing errands, shopping? N -  Preparing Food and eating ? N -  Using the Toilet? N -  In the past six months, have you accidently leaked urine? N -  Do you have problems with loss of bowel control? N -  Managing your Medications? N -  Managing your Finances? N -  Housekeeping or managing your Housekeeping? N -  Some recent data might be hidden    Patient Care Team: Copland, Gay Filler, MD as PCP - General (Family Medicine) Binnie Rail, MD (Internal Medicine)    Assessment:   This is a routine wellness examination for [place]. Physical assessment deferred to PCP.  Exercise Activities and Dietary recommendations Current Exercise Habits: Home exercise routine, Type of exercise: walking, Time (Minutes): 60, Intensity: Mild Diet (meal preparation, eat out, water intake, caffeinated beverages, dairy products, fruits and vegetables): in general, a "healthy" diet  , well balanced   [place]    . patient (pt-stated)     Goal is to be independent with job  To continue your health objectives; diet and exercise        Fall Risk Fall Risk  05/05/2018 04/19/2017 06/18/2016 12/21/2015 12/20/2015  Falls in the past year? No Yes Yes No No  Number falls in past yr: - 2 or more - - -  Injury with Fall? - Yes Yes - -  Follow up - Education provided;Falls prevention discussed - - -    Depression Screen PHQ 2/9 Scores 05/05/2018 04/19/2017 06/18/2016 12/21/2015  PHQ - 2 Score 1 0 0 0  Some  encounter information is confidential and restricted. Go to Review Flowsheets activity to see all data.     Cognitive Function Ad8 score reviewed for issues:  Issues making decisions:no  Less interest in hobbies / activities:no  Repeats questions, stories (family complaining):no  Trouble using ordinary gadgets (microwave, computer, phone):no  Forgets the month or year: no  Mismanaging finances: no  Remembering appts:no  Daily problems with thinking and/or memory:no Ad8 score is=0  MMSE - Mini Mental State Exam 10/22/2015  Not completed: (No Data)        Immunization History  Administered Date(s) Administered  . Influenza, Quadrivalent, Recombinant, Inj, Pf 04/28/2018  . Influenza-Unspecified 04/15/2015, 05/19/2017  . Tdap 04/15/2015    Screening Tests Health Maintenance  Topic Date Due  . MAMMOGRAM  09/01/2018  . PAP SMEAR  04/27/2021  . TETANUS/TDAP  04/14/2025  . COLONOSCOPY  09/23/2025  . INFLUENZA VACCINE  Completed  . HIV Screening  Completed      Plan:    Please schedule your next medicare wellness visit with me in 1 yr.  Continue to eat heart healthy diet (full of fruits, vegetables, whole grains, lean protein, water--limit salt, fat, and sugar intake) and increase physical activity as tolerated.    I have personally reviewed and noted the following in the patient's chart:   . Medical and social history . Use of alcohol, tobacco or illicit drugs  . Current medications and supplements . Functional ability and status . Nutritional status . Physical activity . Advanced directives . List of other physicians . Hospitalizations, surgeries, and ER visits in previous 12 months . Vitals . Screenings to include cognitive, depression, and falls . Referrals and appointments  In addition, I have reviewed and discussed with patient certain preventive protocols, quality metrics, and best practice recommendations. A written personalized care plan for  preventive  services as well as general preventive health recommendations were provided to patient.     Shela Nevin, [place]  05/05/2018

## 2018-05-05 ENCOUNTER — Encounter: Payer: Self-pay | Admitting: *Deleted

## 2018-05-05 ENCOUNTER — Ambulatory Visit (INDEPENDENT_AMBULATORY_CARE_PROVIDER_SITE_OTHER): Payer: Medicare Other | Admitting: *Deleted

## 2018-05-05 VITALS — BP 126/80 | HR 75 | Ht 64.0 in | Wt 163.6 lb

## 2018-05-05 DIAGNOSIS — Z0000 Encounter for general adult medical examination without abnormal findings: Secondary | ICD-10-CM

## 2018-05-05 NOTE — Patient Instructions (Signed)
Please schedule your next medicare wellness visit with me in 1 yr.  Continue to eat heart healthy diet (full of fruits, vegetables, whole grains, lean protein, water--limit salt, fat, and sugar intake) and increase physical activity as tolerated.   Morgan [name] , Thank you for taking time to come for your Medicare Wellness Visit. I appreciate your ongoing commitment to your health goals. Please review the following plan we discussed and let me know if I can assist you in the future.   These are the goals we discussed: Goals    [place]    . patient (pt-stated)     Goal is to be independent with job  To continue your health objectives; diet and exercise        This is a list of the screening recommended for you and due dates:  Health Maintenance  Topic Date Due  . Mammogram  09/01/2018  . Pap Smear  04/27/2021  . Tetanus Vaccine  04/14/2025  . Colon Cancer Screening  09/23/2025  . Flu Shot  Completed  . HIV Screening  Completed    Health Maintenance for Postmenopausal Women Menopause is a normal process in which your reproductive ability comes to an end. This process happens gradually over a span of months to years, usually between the ages of 72 and 39. Menopause is complete when you have missed 12 consecutive menstrual periods. It is important to talk with your health care provider about some of the most common conditions that affect postmenopausal women, such as heart disease, cancer, and bone loss (osteoporosis). Adopting a healthy lifestyle and getting preventive care can help to promote your health and wellness. Those actions can also lower your chances of developing some of these common conditions. What should I know about menopause? During menopause, you may experience a number of symptoms, such as:  Moderate-to-severe hot flashes.  Night sweats.  Decrease in sex drive.  Mood  swings.  Headaches.  Tiredness.  Irritability.  Memory problems.  Insomnia.  Choosing to treat or not to treat menopausal changes is an individual decision that you make with your health care provider. What should I know about hormone replacement therapy and supplements? Hormone therapy products are effective for treating symptoms that are associated with menopause, such as hot flashes and night sweats. Hormone replacement carries certain risks, especially as you become older. If you are thinking about using estrogen or estrogen with progestin treatments, discuss the benefits and risks with your health care provider. What should I know about heart disease and stroke? Heart disease, heart attack, and stroke become more likely as you age. This may be due, in part, to the hormonal changes that your body experiences during menopause. These can affect how your body processes dietary fats, triglycerides, and cholesterol. Heart attack and stroke are both medical emergencies. There are many things that you can do to help prevent heart disease and stroke:  Have your blood pressure checked at least every 1-2 years. High blood pressure causes heart disease and increases the risk of stroke.  If you are 67-2 years old, ask your health care provider if you should take aspirin to prevent a heart attack or a stroke.  Do not use any tobacco products, including cigarettes, chewing tobacco, or electronic cigarettes. If you need help quitting, ask your health care provider.  It is important to eat a healthy diet and maintain a healthy weight. ? Be sure to include plenty  of vegetables, fruits, low-fat dairy products, and lean protein. ? Avoid eating foods that are high in solid fats, added sugars, or salt (sodium).  Get regular exercise. This is one of the most important things that you can do for your health. ? Try to exercise for at least 150 minutes each week. The type of exercise that you do should  increase your heart rate and make you sweat. This is known as moderate-intensity exercise. ? Try to do strengthening exercises at least twice each week. Do these in addition to the moderate-intensity exercise.  Know your numbers.Ask your health care provider to check your cholesterol and your blood glucose. Continue to have your blood tested as directed by your health care provider.  What should I know about cancer screening? There are several types of cancer. Take the following steps to reduce your risk and to catch any cancer development as early as possible. Breast Cancer  Practice breast self-awareness. ? This means understanding how your breasts normally appear and feel. ? It also means doing regular breast self-exams. Let your health care provider know about any changes, no matter how small.  If you are 90 or older, have a clinician do a breast exam (clinical breast exam or CBE) every year. Depending on your age, family history, and medical history, it may be recommended that you also have a yearly breast X-ray (mammogram).  If you have a family history of breast cancer, talk with your health care provider about genetic screening.  If you are at high risk for breast cancer, talk with your health care provider about having an MRI and a mammogram every year.  Breast cancer (BRCA) gene test is recommended for women who have family members with BRCA-related cancers. Results of the assessment will determine the need for genetic counseling and BRCA1 and for BRCA2 testing. BRCA-related cancers include these types: ? Breast. This occurs in males or females. ? Ovarian. ? Tubal. This may also be called fallopian tube cancer. ? Cancer of the abdominal or pelvic lining (peritoneal cancer). ? Prostate. ? Pancreatic.  Cervical, Uterine, and Ovarian Cancer Your health care provider may recommend that you be screened regularly for cancer of the pelvic organs. These include your ovaries, uterus,  and vagina. This screening involves a pelvic exam, which includes checking for microscopic changes to the surface of your cervix (Pap test).  For women ages 21-65, health care providers may recommend a pelvic exam and a Pap test every three years. For women ages 100-65, they may recommend the Pap test and pelvic exam, combined with testing for human papilloma virus (HPV), every five years. Some types of HPV increase your risk of cervical cancer. Testing for HPV may also be done on women of any age who have unclear Pap test results.  Other health care providers may not recommend any screening for nonpregnant women who are considered low risk for pelvic cancer and have no symptoms. Ask your health care provider if a screening pelvic exam is right for you.  If you have had past treatment for cervical cancer or a condition that could lead to cancer, you need Pap tests and screening for cancer for at least 20 years after your treatment. If Pap tests have been discontinued for you, your risk factors (such as having a new sexual partner) need to be reassessed to determine if you should start having screenings again. Some women have medical problems that increase the chance of getting cervical cancer. In these cases, your health  care provider may recommend that you have screening and Pap tests more often.  If you have a family history of uterine cancer or ovarian cancer, talk with your health care provider about genetic screening.  If you have vaginal bleeding after reaching menopause, tell your health care provider.  There are currently no reliable tests available to screen for ovarian cancer.  Lung Cancer Lung cancer screening is recommended for adults 75-72 years old who are at high risk for lung cancer because of a history of smoking. A yearly low-dose CT scan of the lungs is recommended if you:  Currently smoke.  Have a history of at least 30 pack-years of smoking and you currently smoke or have quit  within the past 15 years. A pack-year is smoking an average of one pack of cigarettes per day for one year.  Yearly screening should:  Continue until it has been 15 years since you quit.  Stop if you develop a health problem that would prevent you from having lung cancer treatment.  Colorectal Cancer  This type of cancer can be detected and can often be prevented.  Routine colorectal cancer screening usually begins at age 44 and continues through age 67.  If you have risk factors for colon cancer, your health care provider may recommend that you be screened at an earlier age.  If you have a family history of colorectal cancer, talk with your health care provider about genetic screening.  Your health care provider may also recommend using home test kits to check for hidden blood in your stool.  A small camera at the end of a tube can be used to examine your colon directly (sigmoidoscopy or colonoscopy). This is done to check for the earliest forms of colorectal cancer.  Direct examination of the colon should be repeated every 5-10 years until age 73. However, if early forms of precancerous polyps or small growths are found or if you have a family history or genetic risk for colorectal cancer, you may need to be screened more often.  Skin Cancer  Check your skin from head to toe regularly.  Monitor any moles. Be sure to tell your health care provider: ? About any new moles or changes in moles, especially if there is a change in a mole's shape or color. ? If you have a mole that is larger than the size of a pencil eraser.  If any of your family members has a history of skin cancer, especially at a young age, talk with your health care provider about genetic screening.  Always use sunscreen. Apply sunscreen liberally and repeatedly throughout the day.  Whenever you are outside, protect yourself by wearing long sleeves, pants, a wide-brimmed hat, and sunglasses.  What should I know  about osteoporosis? Osteoporosis is a condition in which bone destruction happens more quickly than new bone creation. After menopause, you may be at an increased risk for osteoporosis. To help prevent osteoporosis or the bone fractures that can happen because of osteoporosis, the following is recommended:  If you are 93-87 years old, get at least 1,000 mg of calcium and at least 600 mg of vitamin D per day.  If you are older than age 8 but younger than age 24, get at least 1,200 mg of calcium and at least 600 mg of vitamin D per day.  If you are older than age 88, get at least 1,200 mg of calcium and at least 800 mg of vitamin D per day.  Smoking  and excessive alcohol intake increase the risk of osteoporosis. Eat foods that are rich in calcium and vitamin D, and do weight-bearing exercises several times each week as directed by your health care provider. What should I know about how menopause affects my mental health? Depression may occur at any age, but it is more common as you become older. Common symptoms of depression include:  Low or sad mood.  Changes in sleep patterns.  Changes in appetite or eating patterns.  Feeling an overall lack of motivation or enjoyment of activities that you previously enjoyed.  Frequent crying spells.  Talk with your health care provider if you think that you are experiencing depression. What should I know about immunizations? It is important that you get and maintain your immunizations. These include:  Tetanus, diphtheria, and pertussis (Tdap) booster vaccine.  Influenza every year before the flu season begins.  Pneumonia vaccine.  Shingles vaccine.  Your health care provider may also recommend other immunizations. This information is not intended to replace advice given to you by your health care provider. Make sure you discuss any questions you have with your health care provider. Document Released: 08/28/2005 Document Revised: 01/24/2016  Document Reviewed: 04/09/2015 Elsevier Interactive Patient Education  2018 [employer].

## 2018-05-05 NOTE — Progress Notes (Signed)
I have reviewed the MWE note by ms. Britt and agree with her documentation

## 2018-05-10 ENCOUNTER — Other Ambulatory Visit: Payer: Self-pay

## 2018-05-10 ENCOUNTER — Encounter (HOSPITAL_COMMUNITY): Payer: Self-pay

## 2018-05-10 ENCOUNTER — Emergency Department (HOSPITAL_COMMUNITY)
Admission: EM | Admit: 2018-05-10 | Discharge: 2018-05-10 | Disposition: A | Discharge status: Home / Self Care | Payer: Medicare Other | Attending: Emergency Medicine | Admitting: Emergency Medicine

## 2018-05-10 ENCOUNTER — Encounter: Payer: Self-pay | Admitting: General Practice

## 2018-05-10 ENCOUNTER — Emergency Department (HOSPITAL_COMMUNITY): Payer: Medicare Other

## 2018-05-10 ENCOUNTER — Ambulatory Visit: Payer: Self-pay

## 2018-05-10 DIAGNOSIS — R51 Headache: Secondary | ICD-10-CM | POA: Insufficient documentation

## 2018-05-10 DIAGNOSIS — R519 Headache, unspecified: Secondary | ICD-10-CM

## 2018-05-10 DIAGNOSIS — S0990XA Unspecified injury of head, initial encounter: Secondary | ICD-10-CM | POA: Diagnosis not present

## 2018-05-10 LAB — CBC WITH DIFFERENTIAL/PLATELET
ABS IMMATURE GRANULOCYTES: 0.02 10*3/uL (ref 0.00–0.07)
BASOS PCT: 2 %
Basophils Absolute: 0.1 10*3/uL (ref 0.0–0.1)
Eosinophils Absolute: 0.1 10*3/uL (ref 0.0–0.5)
Eosinophils Relative: 2 %
HCT: 47.1 % — ABNORMAL HIGH (ref 36.0–46.0)
Hemoglobin: 14.6 g/dL (ref 12.0–15.0)
IMMATURE GRANULOCYTES: 0 %
Lymphocytes Relative: 22 %
Lymphs Abs: 1 10*3/uL (ref 0.7–4.0)
MCH: 26.7 pg (ref 26.0–34.0)
MCHC: 31 g/dL (ref 30.0–36.0)
MCV: 86.1 fL (ref 80.0–100.0)
MONO ABS: 0.8 10*3/uL (ref 0.1–1.0)
Monocytes Relative: 17 %
NEUTROS ABS: 2.7 10*3/uL (ref 1.7–7.7)
NEUTROS PCT: 57 %
Platelets: 205 10*3/uL (ref 150–400)
RBC: 5.47 MIL/uL — ABNORMAL HIGH (ref 3.87–5.11)
RDW: 13.1 % (ref 11.5–15.5)
WBC: 4.7 10*3/uL (ref 4.0–10.5)
nRBC: 0 % (ref 0.0–0.2)

## 2018-05-10 LAB — COMPREHENSIVE METABOLIC PANEL
ALK PHOS: 48 U/L (ref 38–126)
ALT: 15 U/L (ref 0–44)
ANION GAP: 10 (ref 5–15)
AST: 30 U/L (ref 15–41)
Albumin: 4.3 g/dL (ref 3.5–5.0)
BILIRUBIN TOTAL: 1.2 mg/dL (ref 0.3–1.2)
BUN: 9 mg/dL (ref 6–20)
CALCIUM: 9.7 mg/dL (ref 8.9–10.3)
CO2: 25 mmol/L (ref 22–32)
Chloride: 107 mmol/L (ref 98–111)
Creatinine, Ser: 0.79 mg/dL (ref 0.44–1.00)
GFR calc Af Amer: >60 mL/min (ref >60)
Glucose, Bld: 79 mg/dL (ref 70–99)
POTASSIUM: 4.5 mmol/L (ref 3.5–5.1)
Sodium: 142 mmol/L (ref 135–145)
TOTAL PROTEIN: 6.8 g/dL (ref 6.5–8.1)

## 2018-05-10 LAB — URINALYSIS, ROUTINE W REFLEX MICROSCOPIC
Bacteria, UA: NONE SEEN
Bilirubin Urine: NEGATIVE
GLUCOSE, UA: NEGATIVE mg/dL
Ketones, ur: 20 mg/dL — AB
LEUKOCYTES UA: NEGATIVE
Nitrite: NEGATIVE
PH: 6 (ref 5.0–8.0)
PROTEIN: NEGATIVE mg/dL
Specific Gravity, Urine: 1.008 (ref 1.005–1.030)

## 2018-05-10 LAB — LIPASE, BLOOD: Lipase: 44 U/L (ref 11–51)

## 2018-05-10 MED ORDER — METOCLOPRAMIDE HCL 5 MG/ML IJ SOLN: 10.0000 mg | Freq: Once | INTRAMUSCULAR | Status: DC

## 2018-05-10 MED ORDER — METHYLPREDNISOLONE SODIUM SUCC 125 MG IJ SOLR: 125.0000 mg | Freq: Once | INTRAMUSCULAR | Status: DC

## 2018-05-10 MED ORDER — MAGNESIUM SULFATE IN D5W 1-5 GM/100ML-% IV SOLN
1.0000 g | Freq: Once | INTRAVENOUS | Status: AC
  Administered 2018-05-10: 1 g via INTRAVENOUS
  Filled 2018-05-10: qty 100

## 2018-05-10 MED ORDER — DEXAMETHASONE SODIUM PHOSPHATE 10 MG/ML IJ SOLN
10.0000 mg | Freq: Once | INTRAMUSCULAR | Status: AC
  Administered 2018-05-10: 10 mg via INTRAVENOUS
  Filled 2018-05-10: qty 1

## 2018-05-10 MED ORDER — SODIUM CHLORIDE 0.9 % IV BOLUS
1000.0000 mL | Freq: Once | INTRAVENOUS | Status: AC
  Administered 2018-05-10: 1000 mL via INTRAVENOUS

## 2018-05-10 MED ORDER — METOCLOPRAMIDE HCL 5 MG/ML IJ SOLN
10.0000 mg | Freq: Once | INTRAMUSCULAR | Status: AC
  Administered 2018-05-10: 10 mg via INTRAVENOUS
  Filled 2018-05-10: qty 2

## 2018-05-10 MED ORDER — ACETAMINOPHEN 325 MG PO TABS
650.0000 mg | ORAL_TABLET | Freq: Once | ORAL | Status: AC
  Administered 2018-05-10: 650 mg via ORAL
  Filled 2018-05-10: qty 2

## 2018-05-10 NOTE — ED Provider Notes (Signed)
[facility] DEPT Provider Note   CSN: [id number] Arrival date & time: 05/10/18  1327     History   Chief Complaint Chief Complaint  Patient presents with  . Headache    HPI Morgan [name] is a 52 y.o. female.  HPI   Morgan [name] is a 52 y.o. female, with a history of CLL, chronic headaches, asthma, arthritis, anxiety, bipolar, hypothyroidism, and IBS, presenting to the ED with a headache. For the past few days, has been waking up with headaches. They resolve after she gets up and starts her day. Today she awoke with a headache, but it has not resolved. Headaches have been on right, 4/10, throbbing. Accompanied by nausea. She states she hit the top of her head on Oct 17, bending over to look in the fridge, and hitting her head when she stood up.   Beginning Oct 19, began having LLQ pain, "pounding" and stabbing, radiates through to the back, resolves with resting, mostly arises with movement and walking. Seems to improve with eating. States stools have been "more yellow."  All of her abdominal symptoms have improved dramatically since onset.  Denies fever/chills, vomiting, diarrhea, constipation, syncope, vision loss, neuro deficits, neck stiffness, rash, hematuria, dysuria, hematochezia/melena, or any other complaints.    Last imbrutica infusion oct 4 for leukemia. Started in August.  Oncologist is Ennever.    Past Medical History:  Diagnosis Date  . Anxiety   . Arthritis   . Asthma   . Bipolar disorder ([place])   . Bulging lumbar disc 07/19/05  . Chronic headaches   . Chronic lymphocytic leukemia ([place]) 07/31/2016  . Colon polyps   . Degenerative disorder of bone   . Depression   . History of borderline personality disorder   . Hypothyroidism 2007   Developed after use of Lithium  . IBS (irritable bowel syndrome) 1995  . Pneumonia   . Proctitis   . Substance abuse ([place])    ativan last month    Patient Active Problem List   Diagnosis  Date Noted  . Dysfunction of right eustachian tube 12/03/2017  . Piriformis syndrome of right side 04/22/2017  . Lateral epicondylitis of left elbow 04/06/2017  . Solitary pulmonary nodule 10/29/2016  . Chronic lymphocytic leukemia ([place]) 07/31/2016  . Mass in neck 04/17/2016  . Abdominal mass 04/17/2016  . Asthma 12/12/2015  . Multiple environmental allergies 12/12/2015  . Chronic lower back pain 09/12/2015  . Bipolar 1 disorder, depressed ([place]) 08/27/2015    Class: Chronic  . Other specified hypothyroidism 08/23/2015  . Borderline personality disorder ([place]) 08/15/2015  . Severe benzodiazepine use disorder ([place]) 08/15/2015  . Alcohol use disorder, moderate, dependence ([place]) 08/15/2015  . PTSD (post-traumatic stress disorder) 08/15/2015  . History of bipolar disorder 08/15/2015  . Pre-syncope 05/17/2015  . Skull deformity 05/17/2015  . Patellofemoral syndrome of both knees 05/02/2015    Past Surgical History:  Procedure Laterality Date  . ABDOMINAL HYSTERECTOMY    . APPENDECTOMY    . BUNIONECTOMY Right 06/2012  . CHOLECYSTECTOMY    . OOPHORECTOMY    . SHOULDER OPEN ROTATOR CUFF REPAIR Right 03/2010  . TUBAL LIGATION       OB History    Gravida  2   Para  2   Term  2   Preterm  0   AB  0   Living  2     SAB  0   TAB  0   Ectopic  0   Multiple  0   Live Births               Home Medications    Prior to Admission medications   Medication Sig Start Date End Date Taking? Authorizing Provider  acetaminophen (TYLENOL) 500 MG tablet Take 1,000 mg by mouth every 6 (six) hours as needed for mild pain.   Yes [provider]  hyoscyamine (LEVSIN, ANASPAZ) 0.125 MG tablet Take 1 tablet (0.125 mg total) by mouth as needed for cramping. Reported on 09/24/2015 07/05/17  Yes Copland, Gay Filler, MD  Ibrutinib (IMBRUVICA) 420 MG TABS Take 420 mg by mouth daily. 02/18/18  Yes Volanda Napoleon, MD  albuterol (PROVENTIL HFA;VENTOLIN HFA) 108 (90 Base) MCG/ACT  inhaler Inhale 1-2 puffs into the lungs every 6 (six) hours as needed for wheezing or shortness of breath. Patient not taking: Reported on 05/10/2018 06/22/16   Copland, Gay Filler, MD  EPINEPHrine 0.3 mg/0.3 mL IJ SOAJ injection Inject 0.3 mg into the muscle as needed (allergic reaction).  05/24/17   [provider]  Levothyroxine Sodium 25 MCG CAPS Take 1 capsule (25 mcg total) by mouth daily before breakfast. Patient not taking: Reported on 05/05/2018 04/29/18   Copland, Gay Filler, MD    Family History Family History  Problem Relation Age of Onset  . Depression Mother   . Hypertension Mother   . Post-traumatic stress disorder Sister   . Alcohol abuse Brother   . Drug abuse Brother   . Post-traumatic stress disorder Brother   . Thyroid disease Father   . Alzheimer's disease Father   . COPD Maternal Grandmother   . Heart disease Maternal Grandfather   . Arthritis Paternal Grandmother   . Diabetes Paternal Grandmother   . Depression Paternal Grandmother   . Alzheimer's disease Paternal Grandmother   . Heart disease Paternal Grandfather   . Coronary artery disease Paternal Grandfather   . Alcohol abuse Paternal Grandfather   . Colon cancer Neg Hx   . Breast cancer Neg Hx     Social History Social History   Tobacco Use  . Smoking status: Former Smoker    Last attempt to quit: 07/20/1980    Years since quitting: 37.8  . Smokeless tobacco: Never Used  Substance Use Topics  . Alcohol use: No    Alcohol/week: 0.0 standard drinks    Comment: unsure  . Drug use: Yes    Types: Benzodiazepines     Allergies   Aripiprazole; Ciprofloxacin; Lamotrigine; Nitrofurantoin; Other; Peanut oil; Amoxicillin; Doxycycline; Latex; Meloxicam; Naproxen; Pramipexole; Prednisone; Risperidone; Sulfa antibiotics; Tape; Cortisone; and Percocet [oxycodone-acetaminophen]   Review of Systems Review of Systems  Constitutional: Negative for chills, diaphoresis and fever.  Eyes: Negative for  visual disturbance.  Respiratory: Negative for shortness of breath.   Cardiovascular: Negative for chest pain.  Gastrointestinal: Positive for abdominal pain and nausea. Negative for blood in stool, constipation, diarrhea and vomiting.  Genitourinary: Negative for dysuria, flank pain, frequency, hematuria, vaginal bleeding and vaginal discharge.  Musculoskeletal: Positive for back pain. Negative for neck pain.  Skin: Negative for rash.  Neurological: Positive for headaches. Negative for dizziness, syncope, weakness, light-headedness and numbness.  All other systems reviewed and are negative.    Physical Exam Updated Vital Signs BP 127/73 (BP Location: Left Arm)   Pulse 76   Temp 98.5 F (36.9 C) (Oral)   Resp 16   Wt 73.6 kg   SpO2 100%   BMI 27.86 kg/m   Physical Exam  Constitutional: She is oriented  to person, place, and time. She appears well-developed and well-nourished. No distress.  HENT:  Head: Normocephalic and atraumatic.  Eyes: Pupils are equal, round, and reactive to light. Conjunctivae and EOM are normal.  Neck: Neck supple.  Cardiovascular: Normal rate, regular rhythm, normal heart sounds and intact distal pulses.  Pulmonary/Chest: Effort normal and breath sounds normal. No respiratory distress.  Abdominal: Soft. There is tenderness in the left lower quadrant. There is no guarding.  Very mild tenderness, indicated verbally only.  Musculoskeletal: She exhibits no edema.  Lymphadenopathy:    She has no cervical adenopathy.  Neurological: She is alert and oriented to person, place, and time.  Sensation grossly intact to light touch in the extremities. Strength 5/5 in all extremities. No gait disturbance. Coordination intact. Cranial nerves III-XII grossly intact. No facial droop.   Skin: Skin is warm and dry. She is not diaphoretic.  Psychiatric: She has a normal mood and affect. Her behavior is normal.  Nursing note and vitals reviewed.    ED Treatments /  Results  Labs (all labs ordered are listed, but only abnormal results are displayed) Labs Reviewed  URINALYSIS, ROUTINE W REFLEX MICROSCOPIC - Abnormal; Notable for the following components:      Result Value   Color, Urine STRAW (*)    Hgb urine dipstick SMALL (*)    Ketones, ur 20 (*)    All other components within normal limits  CBC WITH DIFFERENTIAL/PLATELET - Abnormal; Notable for the following components:   RBC 5.47 (*)    HCT 47.1 (*)    All other components within normal limits  COMPREHENSIVE METABOLIC PANEL  LIPASE, BLOOD    EKG None  Radiology No results found. Ct Head Wo Contrast  Result Date: 05/10/2018 CLINICAL DATA:  Head injury 5 days prior.  Headache this morning. EXAM: CT HEAD WITHOUT CONTRAST TECHNIQUE: Contiguous axial images were obtained from the base of the skull through the vertex without intravenous contrast. 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 No evidence of parenchymal hemorrhage or extra-axial fluid collection. No mass lesion, mass effect, or midline shift. No CT evidence of acute infarction. Cerebral volume is age appropriate. No ventriculomegaly. Vascular: No acute abnormality. Skull: No evidence of calvarial fracture. Stable densely sclerotic exophytic left frontal calvarial lesion compatible with osteoma. Sinuses/Orbits: Minimal mucoperiosteal thickening in the paranasal sinuses, unchanged. No fluid levels. Other:  The mastoid air cells are unopacified. IMPRESSION: 1.  No evidence of acute intracranial abnormality. 2. 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 3. Minimal chronic paranasal sinusitis. Electronically Signed   By: Ilona Sorrel M.D.   On: 05/10/2018 20:03   Ct Soft Tissue Neck W Contrast  Result Date: 04/16/2018 CLINICAL DATA:  Chronic lymphocytic leukemia, relapsed/refractory, assess treatment response. Current Therapy:   Rituxan/Imbruvica - s/p  cycle #1 on 02/25/2018 EXAM: CT NECK WITH CONTRAST TECHNIQUE: Multidetector CT imaging of the neck was performed using the standard protocol following the bolus administration of intravenous contrast. CONTRAST:  14mL ISOVUE-300 IOPAMIDOL (ISOVUE-300) INJECTION 61% COMPARISON:  Multiple priors, most recent 02/19/2018. FINDINGS: Pharynx and larynx: Normal. No mass or swelling. Salivary glands: No inflammation, mass, or stone. Thyroid: Normal. Lymph nodes: Marked regression of all nodal groups, compared to the 02/19/2018 scan, including specific nodes as identified on the previous examination as follows, as seen on series 7: -RIGHT occipital level 5, image 32, 5 mm as compared with 8 mm. -RIGHT level 1, image 68, 4  mm as compared with 8 mm prior. -LEFT level 2, image 73, 9 x 15 mm as compared with 11 x 24 mm. Vascular: Negative. Limited intracranial: Negative. Visualized orbits: Negative. Mastoids and visualized paranasal sinuses: Clear. Skeleton: Ordinary spondylosis. Upper chest: Reported separately. Other: None. IMPRESSION: Interval regression of all cervical nodal groups consistent with positive response to treatment. Specific examples are outlined. See discussion above. Electronically Signed   By: Staci Righter M.D.   On: 04/16/2018 15:57   Ct Chest W Contrast  Result Date: 04/18/2018 CLINICAL DATA:  CLL. EXAM: CT CHEST, ABDOMEN, AND PELVIS WITH CONTRAST TECHNIQUE: Multidetector CT imaging of the chest, abdomen and pelvis was performed following the standard protocol during bolus administration of intravenous contrast. CONTRAST:  140mL ISOVUE-300 IOPAMIDOL (ISOVUE-300) INJECTION 61% COMPARISON:  02/19/2018. FINDINGS: CT CHEST FINDINGS Cardiovascular: The heart size is normal. No substantial pericardial effusion. No thoracic aortic aneurysm. Mediastinum/Nodes: No mediastinal lymphadenopathy. There is no hilar lymphadenopathy. The esophagus has normal imaging features. 18 mm short axis right axillary lymph node  measured on the prior study has decreased in the interval to 4 mm short axis today. Index left axillary lymph node measured previously at 17 mm has decreased 8 mm short axis on today's exam. Other bilateral axillary lymphadenopathy has decreased similarly. Lymphadenopathy seen previously in the right thoracic inlet has also decreased. Lungs/Pleura: The central tracheobronchial airways are patent. Right upper lobe 5 mm perifissural nodule (50/4) is unchanged. 4 mm nodule in the lingula (82/4) is stable. No focal airspace consolidation. No pulmonary edema or pleural effusion. Musculoskeletal: No worrisome lytic or sclerotic osseous abnormality. CT ABDOMEN PELVIS FINDINGS Hepatobiliary: Scattered low-density lesions of varying size are stable, compatible with cyst. The subcentimeter hypervascular focus in the lateral segment left liver is more subtle today but not substantially changed in size (60/2). Gallbladder surgically absent. No intrahepatic or extrahepatic biliary dilation. Pancreas: No focal mass lesion. No dilatation of the main duct. No intraparenchymal cyst. No peripancreatic edema. Spleen: No splenomegaly. No focal mass lesion. Adrenals/Urinary Tract: No adrenal nodule or mass. Kidneys unremarkable. No evidence for hydroureter. Bladder wall appears subtly irregular and ill-defined. Stomach/Bowel: Stomach is nondistended. No gastric wall thickening. No evidence of outlet obstruction. Duodenum is normally positioned as is the ligament of Treitz. No small bowel wall thickening. No small bowel dilatation. The terminal ileum is normal. Nonvisualization of the appendix is consistent with the reported history of appendectomy. No gross colonic mass. No colonic wall thickening. No substantial diverticular change. Vascular/Lymphatic: No abdominal aortic aneurysm. No abdominal aortic atherosclerotic calcification. There is no gastrohepatic or hepatoduodenal ligament lymphadenopathy. Retroperitoneal lymphadenopathy  noted previously has resolved. 15 mm short axis aortocaval lymph node measured previously is now 8 mm short axis (74/2). Pelvic sidewall lymphadenopathy is decreased with index left external iliac node measured previously at 2.1 cm short axis now measuring 1.1 cm (107/2). Reproductive: Uterus surgically absent.  There is no adnexal mass. Other: No intraperitoneal free fluid. Musculoskeletal: No worrisome lytic or sclerotic osseous abnormality. IMPRESSION: 1. Clear interval decrease in lymphadenopathy of the chest, abdomen, and pelvis with resolution of lymphadenopathy and most of the index regions measured previously. No new or progressive lymphadenopathy on today's exam. 2. Stable tiny right upper lobe and lingular nodules. Electronically Signed   By: Misty Stanley M.D.   On: 04/18/2018 09:10   Ct Abdomen Pelvis W Contrast  Result Date: 04/18/2018 CLINICAL DATA:  CLL. EXAM: CT CHEST, ABDOMEN, AND PELVIS WITH CONTRAST TECHNIQUE: Multidetector CT imaging of the chest, abdomen  and pelvis was performed following the standard protocol during bolus administration of intravenous contrast. CONTRAST:  161mL ISOVUE-300 IOPAMIDOL (ISOVUE-300) INJECTION 61% COMPARISON:  02/19/2018. FINDINGS: CT CHEST FINDINGS Cardiovascular: The heart size is normal. No substantial pericardial effusion. No thoracic aortic aneurysm. Mediastinum/Nodes: No mediastinal lymphadenopathy. There is no hilar lymphadenopathy. The esophagus has normal imaging features. 18 mm short axis right axillary lymph node measured on the prior study has decreased in the interval to 4 mm short axis today. Index left axillary lymph node measured previously at 17 mm has decreased 8 mm short axis on today's exam. Other bilateral axillary lymphadenopathy has decreased similarly. Lymphadenopathy seen previously in the right thoracic inlet has also decreased. Lungs/Pleura: The central tracheobronchial airways are patent. Right upper lobe 5 mm perifissural nodule  (50/4) is unchanged. 4 mm nodule in the lingula (82/4) is stable. No focal airspace consolidation. No pulmonary edema or pleural effusion. Musculoskeletal: No worrisome lytic or sclerotic osseous abnormality. CT ABDOMEN PELVIS FINDINGS Hepatobiliary: Scattered low-density lesions of varying size are stable, compatible with cyst. The subcentimeter hypervascular focus in the lateral segment left liver is more subtle today but not substantially changed in size (60/2). Gallbladder surgically absent. No intrahepatic or extrahepatic biliary dilation. Pancreas: No focal mass lesion. No dilatation of the main duct. No intraparenchymal cyst. No peripancreatic edema. Spleen: No splenomegaly. No focal mass lesion. Adrenals/Urinary Tract: No adrenal nodule or mass. Kidneys unremarkable. No evidence for hydroureter. Bladder wall appears subtly irregular and ill-defined. Stomach/Bowel: Stomach is nondistended. No gastric wall thickening. No evidence of outlet obstruction. Duodenum is normally positioned as is the ligament of Treitz. No small bowel wall thickening. No small bowel dilatation. The terminal ileum is normal. Nonvisualization of the appendix is consistent with the reported history of appendectomy. No gross colonic mass. No colonic wall thickening. No substantial diverticular change. Vascular/Lymphatic: No abdominal aortic aneurysm. No abdominal aortic atherosclerotic calcification. There is no gastrohepatic or hepatoduodenal ligament lymphadenopathy. Retroperitoneal lymphadenopathy noted previously has resolved. 15 mm short axis aortocaval lymph node measured previously is now 8 mm short axis (74/2). Pelvic sidewall lymphadenopathy is decreased with index left external iliac node measured previously at 2.1 cm short axis now measuring 1.1 cm (107/2). Reproductive: Uterus surgically absent.  There is no adnexal mass. Other: No intraperitoneal free fluid. Musculoskeletal: No worrisome lytic or sclerotic osseous  abnormality. IMPRESSION: 1. Clear interval decrease in lymphadenopathy of the chest, abdomen, and pelvis with resolution of lymphadenopathy and most of the index regions measured previously. No new or progressive lymphadenopathy on today's exam. 2. Stable tiny right upper lobe and lingular nodules. Electronically Signed   By: Misty Stanley M.D.   On: 04/18/2018 09:10   Procedures Procedures (including critical care time)  Medications Ordered in ED Medications  sodium chloride 0.9 % bolus 1,000 mL (0 mLs Intravenous Stopped 05/10/18 1819)  acetaminophen (TYLENOL) tablet 650 mg (650 mg Oral Given 05/10/18 1718)  dexamethasone (DECADRON) injection 10 mg (10 mg Intravenous Given 05/10/18 1801)  metoCLOPramide (REGLAN) injection 10 mg (10 mg Intravenous Given 05/10/18 1801)  magnesium sulfate IVPB 1 g 100 mL ( Intravenous Stopped 05/10/18 1850)     Initial Impression / Assessment and Plan / ED Course  I have reviewed the triage vital signs and the nursing notes.  Pertinent labs & imaging results that were available during my care of the patient were reviewed by me and considered in my medical decision making (see chart for details).  Clinical Course as of May 14 855  Tue  May 10, 2018  1623 Discussed pain management and antiemetics. Declined everything other than tylenol.   [SJ]  1938 Discussed change in her WBC and lymphocyte count. Patient states this is a known side effect of her infusions.   WBC: 4.7 [SJ]  1940 Headache has resolved   [SJ]    Clinical Course User Index [SJ] Andilynn Delavega C, PA-C    Patient presents with headache.  Patient's interview was difficult.  Her answers to most questions were vague and they would change at different times during the interview.   Patient is nontoxic appearing, afebrile, not tachycardic, not tachypneic, not hypotensive, maintains excellent SPO2 on room air, and is in no apparent distress.  No focal neuro deficits.  No acute abnormalities on CT.   Initially declined headache treatments other than Tylenol, but then agreed to additional treatments.  Headache resolved and did not recur. Patient also complained of improving abdominal pain.  She initially tells me this pain began last week, but then states it has been going on intermittently for several weeks and that she has already been evaluated for it on more than one occasion, to include CT scan.  Repeat abdominal exams here in the ED were benign. We discussed repeat abdominal imaging and further work up for abdominal pain in the female patient, but patient ultimately declined. She will follow up with her PCP on this matter. The patient was given instructions for home care as well as return precautions. Patient voices understanding of these instructions, accepts the plan, and is comfortable with discharge.   Findings and plan of care discussed with Theotis Burrow, MD. Dr. Rex Kras personally evaluated and examined this patient.  Vitals:   05/10/18 1536 05/10/18 1715 05/10/18 1833 05/10/18 2030  BP: 115/86 109/71 114/64 (!) 109/55  Pulse: 74 69 71 70  Resp: 16 18 18 16   Temp:   98 F (36.7 C)   TempSrc:   Oral   SpO2: 99% 100% 98% 97%  Weight:         Final Clinical Impressions(s) / ED Diagnoses   Final diagnoses:  Right-sided headache    ED Discharge Orders    None       Layla Maw 05/13/18 0902    Little, Wenda Overland, MD 05/15/18 1023

## 2018-05-10 NOTE — Discharge Instructions (Addendum)
Headache   For future headaches please try the following regimen: Acetaminophen: May take acetaminophen (generic for Tylenol), as needed, for pain. Your daily total maximum amount of acetaminophen from all sources should be limited to 4000mg /day for persons without liver problems, or 2000mg /day for those with liver problems.  Hydration: Have a goal of about a half liter of water every couple hours to stay well hydrated.   Sleep: Please be sure to get plenty of sleep with a goal of 8 hours per night. Having a regular bed time and bedtime routine can help with this.  Screens: Reduce the amount of time you are in front of screens.  Take about a 5-10-minute break every hour or every couple hours to give your eyes rest.  Do not use screens in dark rooms.  Glasses with a blue light filter may also help reduce eye fatigue.  Stress: Take steps to reduce stress as much as possible.   Follow up: Follow-up with your primary care provider on this issue.  May also need to follow-up with the neurologist for increased frequency of headaches.

## 2018-05-10 NOTE — Telephone Encounter (Signed)
Patient called in with c/o "headache." She says "it's been going on for a few days, but I don't remember when it started. I wake up with the headache, so I get up and walk around, then the headache eases off. It's worse when I'm sitting or lying down. The headache moves around, but at this moment, it's on the right side of my head and is higher than a 10. I can hardly focus and gather my thoughts. I am nauseated so bad." I asked about migraines, she says "I went to the ED in May and was told I was having a migraine. I saw a neurologist and have an appointment at the end of November." I asked about medications, she says "I can't keep anything down and I'm not on any migraine medication." I asked about head injury, she says "a few days ago I hit my head on the refrigerator pretty hard, but did not lose consciousness, nothing happened, but it just hurt real bad." I asked about other symptoms, she says "nausea, abdominal pain, back pain, right eye pain right now, trouble focusing." According to protocol, go to ED. She says "can Dr. Lorelei Pont just call me in something, because going to the ED you just sit for hours and nothing really gets done." I advised since her pain is a 10 or higher, she's unable to focus, nauseated, dizziness, the ED is the better place for her to receive care, she verbalized understanding. Care advice given, patient verbalized understanding.   Reason for Disposition . [1] SEVERE headache (e.g., excruciating) AND [2] "worst headache" of life  Answer Assessment - Initial Assessment Questions 1. LOCATION: "Where does it hurt?"      On the right side of head 2. ONSET: "When did the headache start?" (Minutes, hours or days)      Last few days 3. PATTERN: "Does the pain come and go, or has it been constant since it started?"     Come and go, worse when sitting, lying down 4. SEVERITY: "How bad is the pain?" and "What does it keep you from doing?"  (e.g., Scale 1-10; mild, moderate, or  severe)   - MILD (1-3): doesn't interfere with normal activities    - MODERATE (4-7): interferes with normal activities or awakens from sleep    - SEVERE (8-10): excruciating pain, unable to do any normal activities        Over a 10 5. RECURRENT SYMPTOM: "Have you ever had headaches before?" If so, ask: "When was the last time?" and "What happened that time?"      Yes, went to ED and diagnosed with migraine in May 6. CAUSE: "What do you think is causing the headache?"     I don't know 7. MIGRAINE: "Have you been diagnosed with migraine headaches?" If so, ask: "Is this headache similar?"      Yes, not similar 8. HEAD INJURY: "Has there been any recent injury to the head?"      Hit my head on the refrigerator a few days ago 9. OTHER SYMPTOMS: "Do you have any other symptoms?" (fever, stiff neck, eye pain, sore throat, cold symptoms)     Nausea, abdominal pain, back pain, right eye, trouble focusing, dizziness 10. PREGNANCY: "Is there any chance you are pregnant?" "When was your last menstrual period?"      No  Protocols used: HEADACHE-A-AH

## 2018-05-10 NOTE — ED Notes (Signed)
Bed: IQ79 Expected date:  Expected time:  Means of arrival:  Comments: Hold for henvitt

## 2018-05-10 NOTE — ED Triage Notes (Signed)
Pt c/o headache x 1 week. Pt states they usually go away after an hour when she wakes up, but this one will not go away. Pt also c/o nausea.

## 2018-05-10 NOTE — Progress Notes (Signed)
[place] Spiritual Care Note  Received call from [facility] requesting prayer for excruciating headache that made it too hard for her even to pray herself. We prayed together by phone and agreed that I would meet her at [facility] for f/u support. Brought her a prayer shawl as a way to wrap herself in others' prayers, as well as a bone pillow for comfort. She also reports significant back and flank pain, which she plans to share with RN/MD. As her headache subdued, Debi was able to share about recent devotional reading that has been helping her cope. She plans to phone with update in the coming days.  [place], [place], [facility] M-F daytime pager [phone] [facility] 24/7 pager [phone] Voicemail [phone]

## 2018-05-11 ENCOUNTER — Encounter: Payer: Self-pay | Admitting: General Practice

## 2018-05-11 ENCOUNTER — Ambulatory Visit: Payer: Medicare Other | Admitting: Psychiatry

## 2018-05-11 NOTE — Progress Notes (Signed)
[place] Spiritual Care Note  Received and replied to email from [place] me for prayer shawl, bone pillow, and Spiritual Care visit in the ED. She was very grateful for the comforting gestures and care.   [place], [place], [facility] Pager [phone] Voicemail [phone]

## 2018-05-13 ENCOUNTER — Encounter: Payer: Self-pay | Admitting: General Practice

## 2018-05-13 NOTE — Progress Notes (Signed)
[facility] Spiritual Care Note  Had >1h appt for spiritual direction [guidance/accompaniment]. As [employer] with what it means to her to have cancer and all of the changes it is wreaking in her life, she found meaning and appropriate challenge in the iconic quote from [place] about living the questions themselves as a means to living into the answers. Keatyn makes good use of Spiritual Care as an integral part of her processing and coping with cancer. She knows to reach out as needed/desired for further support.   [place], [place], [facility] Pager [phone] Voicemail [phone]

## 2018-05-17 ENCOUNTER — Encounter: Payer: Self-pay | Admitting: General Practice

## 2018-05-17 NOTE — Progress Notes (Signed)
Morgan [name] Note  Received call from [facility] regarding her discernment about (dis)continuing treatment due to significant interference with QOL (pain, mood destabilization, etc). Provided reflective listening and encouraged her to share her questions and concerns with Dr Ouida Sills and Dr Marin Olp for assistance with such significant decisions.   [place], [place], [facility] Pager [phone] Voicemail [phone]

## 2018-05-18 ENCOUNTER — Ambulatory Visit (INDEPENDENT_AMBULATORY_CARE_PROVIDER_SITE_OTHER): Payer: Medicare Other | Admitting: Psychiatry

## 2018-05-18 DIAGNOSIS — F4310 Post-traumatic stress disorder, unspecified: Secondary | ICD-10-CM

## 2018-05-18 DIAGNOSIS — F3173 Bipolar disorder, in partial remission, most recent episode manic: Secondary | ICD-10-CM | POA: Diagnosis not present

## 2018-05-19 ENCOUNTER — Telehealth: Payer: Self-pay | Admitting: Family Medicine

## 2018-05-19 ENCOUNTER — Encounter: Payer: Self-pay | Admitting: Family Medicine

## 2018-05-19 NOTE — Telephone Encounter (Signed)
Called her and LMOM- I am sorry that I missed her.  I am not able to "make" neurology see her sooner If she is having an unremitting HA she may need to go to the ER.  Otherwise I would suggest calling neurology to see if there may be any cancellations Please let me know if I can be of any further assistance

## 2018-05-19 NOTE — Telephone Encounter (Signed)
Copied from [place]. Topic: Quick Communication - See Telephone Encounter >> May 19, 2018  9:12 AM Vernona Rieger wrote: CRM for notification. See Telephone encounter for: 05/19/18.  Patient called and stated that she has had a migraine since last night and tylenol is not touching it at all. She hardly slept at all last night and does not see neurology until next month. She states she was in [facility] last Tuesday for it. She wants to know if Dr Lorelei Pont could get her in any sooner with the neurologist. She wants to know if Dr Lorelei Pont can give her a call back?

## 2018-05-23 ENCOUNTER — Ambulatory Visit: Payer: Medicare Other | Admitting: Diagnostic Neuroimaging

## 2018-05-23 ENCOUNTER — Encounter: Payer: Self-pay | Admitting: Diagnostic Neuroimaging

## 2018-05-23 VITALS — BP 124/69 | HR 75 | Ht 64.0 in | Wt 164.0 lb

## 2018-05-23 DIAGNOSIS — R51 Headache: Secondary | ICD-10-CM

## 2018-05-23 DIAGNOSIS — R519 Headache, unspecified: Secondary | ICD-10-CM

## 2018-05-23 MED ORDER — TOPIRAMATE 50 MG PO TABS: 50.0000 mg | ORAL_TABLET | Freq: Two times a day (BID) | ORAL | 12 refills | Status: DC

## 2018-05-23 MED ORDER — RIZATRIPTAN BENZOATE 10 MG PO TBDP: 10.0000 mg | ORAL_TABLET | ORAL | 11 refills | Status: DC | PRN

## 2018-05-23 MED ORDER — ALPRAZOLAM 0.5 MG PO TABS: 0.5000 mg | ORAL_TABLET | ORAL | 0 refills | Status: DC | PRN

## 2018-05-23 NOTE — Progress Notes (Signed)
[facility]  PATIENT: Morgan [name] DOB: November 01, 1965  REFERRING CLINICIAN: Copland HISTORY FROM: patient  REASON FOR VISIT: new consult    HISTORICAL  CHIEF COMPLAINT:  Chief Complaint  Patient presents with   New Patient (Initial Visit)    Rm 6, alone   Referred by Dr. Janett Billow Copland    Headache associated with/ with out sexual activity.  Migraines 3/ week.  taking tylenol 500mg  po prn and benadryl prn.      HISTORY OF PRESENT ILLNESS:   52 year old female with history of CLL, bipolar disorder, IBS, here for evaluation of headaches.  Patient has history of migraine headaches since age 64 years old with left-sided throbbing severe headaches with nausea, photophobia, phonophobia, triggered by sunlight.  She was on Imitrex in the past which helped.  Her last major migraine headache was in June 2016.  In 2017 patient developed new type of "weird headache".  These headaches consist of occipital and whole head throbbing sensation, nausea, vomiting, some restlessness, numbness in the face, foggy thinking, blurred vision.  These are different than her prior migraine headaches.  Patient also has recent diagnosis of CLL, currently not on treatment due to exacerbation of bipolar symptoms with CLL treatment.  Patient also has hypothyroidism, not currently on treatment because patient unable to afford medications.  Recent TSH was greater than 8.  Patient has had clusters of headache in May and October 2019.  Patient has had more than 5-6 headaches in the last 1 month.   REVIEW OF SYSTEMS: Full 14 system review of systems performed and negative with exception of: Depression change in appetite headache numbness dizziness insomnia easy bruising blurred vision fevers chills weight gain fatigue chest pain shortness of breath cough blood in urine allergies runny nose aching muscles cramps rash.  ALLERGIES: Allergies  Allergen Reactions   Aripiprazole Anaphylaxis and  Swelling   Ciprofloxacin Shortness Of Breath   Lamotrigine Rash and Other (See Comments)   Nitrofurantoin Hives   Other Anaphylaxis, Hives and Swelling    Walnuts and pine nuts   Peanut Oil Anaphylaxis, Hives, Swelling and Rash   Amoxicillin Hives and Rash    Has patient had a PCN reaction causing immediate rash, facial/tongue/throat swelling, SOB or lightheadedness with hypotension: Yes Has patient had a PCN reaction causing severe rash involving mucus membranes or skin necrosis: No Has patient had a PCN reaction that required hospitalization: No Has patient had a PCN reaction occurring within the last 10 years: No If all of the above answers are "NO", then may proceed with Cephalosporin use.   Doxycycline Hives and Rash   Latex Rash and Other (See Comments)   Meloxicam Itching and Other (See Comments)    Induces mania Interacts with Lithium   Naproxen Hives and Other (See Comments)    Interacts with lithium   Pramipexole Hives and Rash   Prednisone Other (See Comments)    Interacts with Lithium   Risperidone Hives and Rash   Sulfa Antibiotics Rash    "that leaves scarring"   Tape Itching and Rash    Steri-Strips   Cortisone Other (See Comments)    Exacerbates the mania of her bipolar   Percocet [Oxycodone-Acetaminophen] Other (See Comments)    Severe dizziness    HOME MEDICATIONS: Outpatient Medications Prior to Visit  Medication Sig Dispense Refill   acetaminophen (TYLENOL) 500 MG tablet Take 1,000 mg by mouth every 6 (six) hours as needed for mild pain.     albuterol (PROVENTIL HFA;VENTOLIN  HFA) 108 (90 Base) MCG/ACT inhaler Inhale 1-2 puffs into the lungs every 6 (six) hours as needed for wheezing or shortness of breath. 1 each 3   diphenhydrAMINE (BENADRYL ALLERGY) 25 mg capsule Take 25 mg by mouth every 6 (six) hours as needed.     EPINEPHrine 0.3 mg/0.3 mL IJ SOAJ injection Inject 0.3 mg into the muscle as needed (allergic reaction).       hyoscyamine (LEVSIN, ANASPAZ) 0.125 MG tablet Take 1 tablet (0.125 mg total) by mouth as needed for cramping. Reported on 09/24/2015 10 tablet 2   Ibrutinib (IMBRUVICA) 420 MG TABS Take 420 mg by mouth daily. (Patient not taking: Reported on 05/23/2018) 30 tablet 12   Levothyroxine Sodium 25 MCG CAPS Take 1 capsule (25 mcg total) by mouth daily before breakfast. (Patient not taking: Reported on 05/23/2018) 30 capsule 3   No facility-administered medications prior to visit.     PAST MEDICAL HISTORY: Past Medical History:  Diagnosis Date   Anxiety    Arthritis    Asthma    Bipolar disorder ([place])    Bulging lumbar disc 07/19/05   Chronic headaches    Chronic lymphocytic leukemia ([place]) 07/31/2016   Colon polyps    Degenerative disorder of bone    Depression    History of borderline personality disorder    Hypothyroidism 2007   Developed after use of Lithium   IBS (irritable bowel syndrome) 1995   Pneumonia    Proctitis    Substance abuse ([place])    ativan last month    PAST SURGICAL HISTORY: Past Surgical History:  Procedure Laterality Date   ABDOMINAL HYSTERECTOMY     APPENDECTOMY     BUNIONECTOMY Right 06/2012   CHOLECYSTECTOMY     OOPHORECTOMY     SHOULDER OPEN ROTATOR CUFF REPAIR Right 03/2010   TUBAL LIGATION      FAMILY HISTORY: Family History  Problem Relation Age of Onset   Depression Mother    Hypertension Mother    Post-traumatic stress disorder Sister    Alcohol abuse Brother    Drug abuse Brother    Post-traumatic stress disorder Brother    Thyroid disease Father    Alzheimer's disease Father    COPD Maternal Grandmother    Heart disease Maternal Grandfather    Arthritis Paternal Grandmother    Diabetes Paternal Grandmother    Depression Paternal Grandmother    Alzheimer's disease Paternal Grandmother    Heart disease Paternal Grandfather    Coronary artery disease Paternal Grandfather    Alcohol abuse Paternal  Grandfather    Colon cancer Neg Hx    Breast cancer Neg Hx     SOCIAL HISTORY: Social History   Socioeconomic History   Marital status: Divorced    Spouse name: Not on file   Number of children: 2   Years of education: 14   Highest education level: Not on file  Occupational History   Occupation: Unemployed  Scientist, product/process development strain: Not on file   Food insecurity:    Worry: Not on file    Inability: Not on file   Transportation needs:    Medical: Not on file    Non-medical: Not on file  Tobacco Use   Smoking status: Former Smoker    Last attempt to quit: 07/20/1980    Years since quitting: 37.8   Smokeless tobacco: Never Used  Substance and Sexual Activity   Alcohol use: No    Alcohol/week: 0.0 standard drinks  Comment: unsure   Drug use: Yes    Types: Benzodiazepines   Sexual activity: Never    Birth control/protection: Surgical    Comment: intercourse age unknown,sexual partners less than 5  Lifestyle   Physical activity:    Days per week: Not on file    Minutes per session: Not on file   Stress: Not on file  Relationships   Social connections:    Talks on phone: Not on file    Gets together: Not on file    Attends religious service: Not on file    Active member of club or organization: Not on file    Attends meetings of clubs or organizations: Not on file    Relationship status: Not on file   Intimate partner violence:    Fear of current or ex partner: Not on file    Emotionally abused: Not on file    Physically abused: Not on file    Forced sexual activity: Not on file  Other Topics Concern   Not on file  Social History Narrative   Fun: [place], travel, music, writing, walking, hiking, volunteering   Denies religious beliefs effecting health care.    Feels safe at home.    Divorced, Works at [employer].  Children 2.  Lives home alone.     PHYSICAL EXAM  GENERAL EXAM/CONSTITUTIONAL: Vitals:  Vitals:    05/23/18 1314  BP: 124/69  Pulse: 75  Weight: 164 lb (74.4 kg)  Height: 5\' 4"  (1.626 m)     Body mass index is 28.15 kg/m. Wt Readings from Last 3 Encounters:  05/23/18 164 lb (74.4 kg)  05/10/18 162 lb 4.8 oz (73.6 kg)  05/05/18 163 lb 9.6 oz (74.2 kg)     Patient is in no distress; well developed, nourished and groomed; neck is supple  CARDIOVASCULAR:  Examination of carotid arteries is normal; no carotid bruits  Regular rate and rhythm, no murmurs  Examination of peripheral vascular system by observation and palpation is normal  EYES:  Ophthalmoscopic exam of optic discs and posterior segments is normal; no papilledema or hemorrhages  No exam data present  MUSCULOSKELETAL:  Gait, strength, tone, movements noted in Neurologic exam below  NEUROLOGIC: MENTAL STATUS:  MMSE - [place] Exam 10/22/2015  Not completed: (No Data)    awake, alert, oriented to person, place and time  recent and remote memory intact  normal attention and concentration  language fluent, comprehension intact, naming intact  fund of knowledge appropriate  CRANIAL NERVE:   2nd - no papilledema on fundoscopic exam  2nd, 3rd, 4th, 6th - pupils equal and reactive to light, visual fields full to confrontation, extraocular muscles intact, no nystagmus  5th - facial sensation symmetric  7th - facial strength symmetric  8th - hearing intact  9th - palate elevates symmetrically, uvula midline  11th - shoulder shrug symmetric  12th - tongue protrusion midline  MOTOR:   normal bulk and tone, full strength in the BUE, BLE  SENSORY:   normal and symmetric to light touch, temperature, vibration  COORDINATION:   finger-nose-finger, fine finger movements normal  REFLEXES:   deep tendon reflexes present and symmetric  GAIT/STATION:   narrow based gait     DIAGNOSTIC DATA (LABS, IMAGING, TESTING) - I reviewed patient records, labs, notes, testing and imaging  myself where available.  Lab Results  Component Value Date   WBC 4.7 05/10/2018   HGB 14.6 05/10/2018   HCT 47.1 (H) 05/10/2018  MCV 86.1 05/10/2018   PLT 205 05/10/2018      Component Value Date/Time   NA 142 05/10/2018 1804   NA 142 03/10/2017 0954   NA 142 09/04/2016 0754   K 4.5 05/10/2018 1804   K 4.2 03/10/2017 0954   K 4.0 09/04/2016 0754   CL 107 05/10/2018 1804   CL 111 (H) 03/10/2017 0954   CO2 25 05/10/2018 1804   CO2 28 03/10/2017 0954   CO2 25 09/04/2016 0754   GLUCOSE 79 05/10/2018 1804   GLUCOSE 83 03/10/2017 0954   BUN 9 05/10/2018 1804   BUN 11 03/10/2017 0954   BUN 13.4 09/04/2016 0754   CREATININE 0.79 05/10/2018 1804   CREATININE 1.00 04/22/2018 0848   CREATININE 1.1 03/10/2017 0954   CREATININE 0.8 09/04/2016 0754   CALCIUM 9.7 05/10/2018 1804   CALCIUM 9.6 03/10/2017 0954   CALCIUM 9.7 09/04/2016 0754   PROT 6.8 05/10/2018 1804   PROT 6.3 03/10/2017 0954   PROT 6.8 03/10/2017 0954   PROT 6.6 09/04/2016 0754   ALBUMIN 4.3 05/10/2018 1804   ALBUMIN 3.7 03/10/2017 0954   ALBUMIN 4.0 09/04/2016 0754   AST 30 05/10/2018 1804   AST 25 04/22/2018 0848   AST 20 09/04/2016 0754   ALT 15 05/10/2018 1804   ALT 17 04/22/2018 0848   ALT 20 03/10/2017 0954   ALT 14 09/04/2016 0754   ALKPHOS 48 05/10/2018 1804   ALKPHOS 46 03/10/2017 0954   ALKPHOS 54 09/04/2016 0754   BILITOT 1.2 05/10/2018 1804   BILITOT 0.7 04/22/2018 0848   BILITOT 0.39 09/04/2016 0754   GFRNONAA >60 05/10/2018 1804   GFRNONAA >60 12/01/2017 1116   GFRAA >60 05/10/2018 1804   GFRAA >60 12/01/2017 1116   Lab Results  Component Value Date   CHOL 241 (H) 04/27/2018   HDL 104.30 04/27/2018   LDLCALC 103 (H) 04/27/2018   TRIG 171.0 (H) 04/27/2018   CHOLHDL 2 04/27/2018   Lab Results  Component Value Date   HGBA1C 5.5 04/27/2018   No results found for: HALPFXTK24 Lab Results  Component Value Date   TSH 8.67 (H) 04/27/2018    05/10/18 CT head [I reviewed images myself  and agree with interpretation. -VRP]  1. No evidence of acute intracranial abnormality. 2. 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 3. Minimal chronic paranasal sinusitis.    ASSESSMENT AND PLAN  52 y.o. year old female here with history of migraine with aura, now with new type of headache in the last 2 years.  Could represent change in migraine versus other secondary headache.  We will proceed with further work-up and treatment.   Dx:  1. New onset headache      PLAN:  CHANGE IN HEADACHE (migraine with aura vs other secondary headache) - consider topiramate 50mg  at bedtime; after 1 week increase to twice a day; drink plenty of water - consider rizatriptan 10mg  as needed for breakthrough headache; may repeat x 1 after 2 hours; max 2 tabs per day or 8 per month - check MRI brain to rule out secondary cause  HYPOTHYROIDISM - resume thyroid replacement  CLL - resume treatment options per heme-onc  Orders Placed This Encounter  Procedures   MR BRAIN W WO CONTRAST   Meds ordered this encounter  Medications   topiramate (TOPAMAX) 50 MG tablet    Sig: Take 1 tablet (50 mg total) by mouth 2 (two) times daily.    Dispense:  60 tablet  Refill:  12   rizatriptan (MAXALT-MLT) 10 MG disintegrating tablet    Sig: Take 1 tablet (10 mg total) by mouth as needed for migraine. May repeat in 2 hours if needed    Dispense:  9 tablet    Refill:  11   ALPRAZolam (XANAX) 0.5 MG tablet    Sig: Take 1 tablet (0.5 mg total) by mouth as needed for anxiety (for sedation before MRI scan; take 1 hour before scan; may repeat 15 min before scan).    Dispense:  3 tablet    Refill:  0   Return in about 4 months (around 09/21/2018) for with NP/PA or Mehak Roskelley.    Penni Bombard, MD 25/12/3891, 7:34 PM Certified in Neurology, Neurophysiology and Neuroimaging  [facility] [address], [place], [place] [address] [phone]

## 2018-05-23 NOTE — Patient Instructions (Signed)
  CHANGE IN HEADACHE (migraine with aura vs other secondary headache) - consider topiramate 50mg  at bedtime; after 1 week increase to twice a day; drink plenty of water - consider rizatriptan 10mg  as needed for breakthrough headache; may repeat x 1 after 2 hours; max 2 tabs per day or 8 per month - To prevent or relieve headaches, try the following:   Cool Compress. Lie down and place a cool compress on your head.   Avoid headache triggers. If certain foods or odors seem to have triggered your migraines in the past, avoid them. A headache diary might help you identify triggers.   Include physical activity in your daily routine.   Manage stress. Find healthy ways to cope with the stressors, such as delegating tasks on your to-do list.   Practice relaxation techniques. Try deep breathing, yoga, massage and visualization.   Eat regularly. Eating regularly scheduled meals and maintaining a healthy diet might help prevent headaches. Also, drink plenty of fluids.   Follow a regular sleep schedule. Sleep deprivation might contribute to headaches  Consider biofeedback. With this mind-body technique, you learn to control certain bodily functions - such as muscle tension, heart rate and blood pressure - to prevent headaches or reduce headache pain.

## 2018-05-24 ENCOUNTER — Other Ambulatory Visit: Payer: Self-pay | Admitting: Hematology & Oncology

## 2018-05-24 ENCOUNTER — Telehealth: Payer: Self-pay | Admitting: Diagnostic Neuroimaging

## 2018-05-24 NOTE — Telephone Encounter (Signed)
[employer] medicare order sent to GI. No auth they will reach out to the pt to schedule.  °

## 2018-05-24 NOTE — Telephone Encounter (Signed)
LVM for patient to be aware. I also left GI phone number of [phone] and to give them a call if she has not heard in the next 2-3 business days.

## 2018-05-26 ENCOUNTER — Ambulatory Visit: Payer: Medicare Other | Admitting: Psychiatry

## 2018-05-27 ENCOUNTER — Inpatient Hospital Stay: Payer: Medicare Other | Attending: Family | Admitting: Hematology & Oncology

## 2018-05-27 ENCOUNTER — Other Ambulatory Visit: Payer: Self-pay

## 2018-05-27 ENCOUNTER — Encounter: Payer: Self-pay | Admitting: Hematology & Oncology

## 2018-05-27 ENCOUNTER — Inpatient Hospital Stay: Payer: Medicare Other

## 2018-05-27 VITALS — BP 119/71 | HR 87 | Temp 97.8°F | Resp 18 | Wt 162.0 lb

## 2018-05-27 DIAGNOSIS — C9110 Chronic lymphocytic leukemia of B-cell type not having achieved remission: Secondary | ICD-10-CM

## 2018-05-27 LAB — CMP (CANCER CENTER ONLY)
ALK PHOS: 62 U/L (ref 26–84)
ALT: 18 U/L (ref 10–47)
ANION GAP: 12 (ref 5–15)
AST: 26 U/L (ref 11–38)
Albumin: 3.9 g/dL (ref 3.5–5.0)
BILIRUBIN TOTAL: 0.6 mg/dL (ref 0.2–1.6)
BUN: 12 mg/dL (ref 7–22)
CHLORIDE: 109 mmol/L — AB (ref 98–108)
CO2: 24 mmol/L (ref 18–33)
Calcium: 10.5 mg/dL — ABNORMAL HIGH (ref 8.0–10.3)
Creatinine: 0.9 mg/dL (ref 0.60–1.20)
Glucose, Bld: 96 mg/dL (ref 73–118)
POTASSIUM: 3.6 mmol/L (ref 3.3–4.7)
Sodium: 145 mmol/L (ref 128–145)
TOTAL PROTEIN: 7.1 g/dL (ref 6.4–8.1)

## 2018-05-27 LAB — CBC WITH DIFFERENTIAL (CANCER CENTER ONLY)
Abs Immature Granulocytes: 0.02 10*3/uL (ref 0.00–0.07)
BASOS ABS: 0.1 10*3/uL (ref 0.0–0.1)
BASOS PCT: 1 %
Eosinophils Absolute: 0.3 10*3/uL (ref 0.0–0.5)
Eosinophils Relative: 5 %
HCT: 46.9 % — ABNORMAL HIGH (ref 36.0–46.0)
HEMOGLOBIN: 14.5 g/dL (ref 12.0–15.0)
Immature Granulocytes: 0 %
LYMPHS PCT: 45 %
Lymphs Abs: 2.6 10*3/uL (ref 0.7–4.0)
MCH: 25.9 pg — AB (ref 26.0–34.0)
MCHC: 30.9 g/dL (ref 30.0–36.0)
MCV: 83.9 fL (ref 80.0–100.0)
Monocytes Absolute: 0.7 10*3/uL (ref 0.1–1.0)
Monocytes Relative: 13 %
NEUTROS ABS: 2.1 10*3/uL (ref 1.7–7.7)
NRBC: 0 % (ref 0.0–0.2)
Neutrophils Relative %: 36 %
PLATELETS: 320 10*3/uL (ref 150–400)
RBC: 5.59 MIL/uL — AB (ref 3.87–5.11)
RDW: 13 % (ref 11.5–15.5)
WBC: 5.8 10*3/uL (ref 4.0–10.5)

## 2018-05-27 LAB — SAVE SMEAR (SSMR)

## 2018-05-27 NOTE — Progress Notes (Signed)
Hematology and Oncology Follow Up Visit  Jenilee Franey [id number] March 22, 1966 52 y.o. 05/27/2018   Principle Diagnosis:  CLL  -- unmutated IgVH  Current Therapy:   Rituxan/Imbruvica - s/p cycle 3 -- pt. Declined any further therapy   Interim History: Ms. Gallus is here today for follow-up.she has decided to forego any further therapy.  She feels that the Rituxan in the [facility] are just causing too much problems for her psychological issues.  She has bipolar disorder.  She just feels that the chemicals that are being used just are causing too much of an imbalance.  I cannot argue with her.  I want her to have quality of life.  Quality of life is what she wants.  I think that we have achieved a very good response to date.  Her last CT scans that showed marked decrease in adenopathy.  Her white cell count is come back down to normal.  She thinks that her CLL is already coming back.  She said that she feels a lymph node on the left neck.  She also feels a lymph node in the left inguinal region.  She is had no fever.  She is back to work.  She wants to be able to be productive at work.  As such, we are going to forego any further therapy.  I really do not think this is going to be a problem for her.  Hopefully, we will keep her in a good partial remission for quite a while.  She says the next year, that she wants to move to the [place].  She is not sure where but she wants to move.  She has thought long and hard about this decision.  She feels that no further therapy is the best way for her to proceed.  Overall, her performance status is ECOG 1.    Medications:  Allergies as of 05/27/2018      Reactions   Aripiprazole Anaphylaxis, Swelling   Ciprofloxacin Shortness Of Breath   Lamotrigine Rash, Other (See Comments)   Nitrofurantoin Hives   Other Anaphylaxis, Hives, Swelling   Walnuts and pine nuts   Peanut Oil Anaphylaxis, Hives, Swelling, Rash   Amoxicillin Hives, Rash     Has patient had a PCN reaction causing immediate rash, facial/tongue/throat swelling, SOB or lightheadedness with hypotension: Yes Has patient had a PCN reaction causing severe rash involving mucus membranes or skin necrosis: No Has patient had a PCN reaction that required hospitalization: No Has patient had a PCN reaction occurring within the last 10 years: No If all of the above answers are "NO", then may proceed with Cephalosporin use.   Doxycycline Hives, Rash   Latex Rash, Other (See Comments)   Meloxicam Itching, Other (See Comments)   Induces mania Interacts with Lithium   Naproxen Hives, Other (See Comments)   Interacts with lithium   Pramipexole Hives, Rash   Prednisone Other (See Comments)   Interacts with Lithium   Risperidone Hives, Rash   Sulfa Antibiotics Rash   "that leaves scarring"   Tape Itching, Rash   Steri-Strips   Cortisone Other (See Comments)   Exacerbates the mania of her bipolar   Percocet [oxycodone-acetaminophen] Other (See Comments)   Severe dizziness      Medication List        Accurate as of 05/27/18 10:14 AM. Always use your most recent med list.          acetaminophen 500 MG tablet Commonly known as:  TYLENOL  Take 1,000 mg by mouth every 6 (six) hours as needed for mild pain.   albuterol 108 (90 Base) MCG/ACT inhaler Commonly known as:  PROVENTIL HFA;VENTOLIN HFA Inhale 1-2 puffs into the lungs every 6 (six) hours as needed for wheezing or shortness of breath.   ALPRAZolam 0.5 MG tablet Commonly known as:  XANAX Take 1 tablet (0.5 mg total) by mouth as needed for anxiety (for sedation before MRI scan; take 1 hour before scan; may repeat 15 min before scan).   BENADRYL ALLERGY 25 mg capsule Generic drug:  diphenhydrAMINE Take 25 mg by mouth every 6 (six) hours as needed.   EPINEPHrine 0.3 mg/0.3 mL Soaj injection Commonly known as:  EPI-PEN Inject 0.3 mg into the muscle as needed (allergic reaction).   hyoscyamine 0.125 MG  tablet Commonly known as:  LEVSIN, ANASPAZ Take 1 tablet (0.125 mg total) by mouth as needed for cramping. Reported on 09/24/2015   Levothyroxine Sodium 25 MCG Caps Take 1 capsule (25 mcg total) by mouth daily before breakfast.   rizatriptan 10 MG disintegrating tablet Commonly known as:  MAXALT-MLT Take 1 tablet (10 mg total) by mouth as needed for migraine. May repeat in 2 hours if needed   topiramate 50 MG tablet Commonly known as:  TOPAMAX Take 1 tablet (50 mg total) by mouth 2 (two) times daily.       Allergies:  Allergies  Allergen Reactions   Aripiprazole Anaphylaxis and Swelling   Ciprofloxacin Shortness Of Breath   Lamotrigine Rash and Other (See Comments)   Nitrofurantoin Hives   Other Anaphylaxis, Hives and Swelling    Walnuts and pine nuts   Peanut Oil Anaphylaxis, Hives, Swelling and Rash   Amoxicillin Hives and Rash    Has patient had a PCN reaction causing immediate rash, facial/tongue/throat swelling, SOB or lightheadedness with hypotension: Yes Has patient had a PCN reaction causing severe rash involving mucus membranes or skin necrosis: No Has patient had a PCN reaction that required hospitalization: No Has patient had a PCN reaction occurring within the last 10 years: No If all of the above answers are "NO", then may proceed with Cephalosporin use.   Doxycycline Hives and Rash   Latex Rash and Other (See Comments)   Meloxicam Itching and Other (See Comments)    Induces mania Interacts with Lithium   Naproxen Hives and Other (See Comments)    Interacts with lithium   Pramipexole Hives and Rash   Prednisone Other (See Comments)    Interacts with Lithium   Risperidone Hives and Rash   Sulfa Antibiotics Rash    "that leaves scarring"   Tape Itching and Rash    Steri-Strips   Cortisone Other (See Comments)    Exacerbates the mania of her bipolar   Percocet [Oxycodone-Acetaminophen] Other (See Comments)    Severe dizziness    Past  Medical History, Surgical history, Social history, and Family History were reviewed and updated.  Review of Systems: Review of Systems  Constitutional: Negative.   HENT: Negative.   Eyes: Negative.   Respiratory: Negative.   Cardiovascular: Negative.   Gastrointestinal: Negative.   Genitourinary: Negative.   Musculoskeletal: Negative.   Skin: Negative.   Neurological: Negative.   Endo/Heme/Allergies: Negative.   Psychiatric/Behavioral: Negative.       Physical Exam:  weight is 162 lb (73.5 kg). Her oral temperature is 97.8 F (36.6 C). Her blood pressure is 119/71 and her pulse is 87. Her respiration is 18 and oxygen saturation is 100%.  Wt Readings from Last 3 Encounters:  05/27/18 162 lb (73.5 kg)  05/23/18 164 lb (74.4 kg)  05/10/18 162 lb 4.8 oz (73.6 kg)    Physical Exam  Constitutional: She is oriented to person, place, and time.  HENT:  Head: Normocephalic and atraumatic.  Mouth/Throat: Oropharynx is clear and moist.  Eyes: Pupils are equal, round, and reactive to light. EOM are normal.  Neck: Normal range of motion.  There may be a slight fullness in the left posterior region of her neck.  I cannot palpate any obvious adenopathy in either the cervical or supraclavicular regions bilaterally.  Cardiovascular: Normal rate, regular rhythm and normal heart sounds.  Pulmonary/Chest: Effort normal and breath sounds normal.  I cannot palpate any axillary adenopathy bilaterally.  Abdominal: Soft. Bowel sounds are normal.  I cannot palpate any adenopathy in the inguinal region bilaterally.  There is no hepatosplenomegaly.  Musculoskeletal: Normal range of motion. She exhibits no edema, tenderness or deformity.  Lymphadenopathy:    She has no cervical adenopathy.  Neurological: She is alert and oriented to person, place, and time.  Skin: Skin is warm and dry. No rash noted. No erythema.  Psychiatric: She has a normal mood and affect. Her behavior is normal. Judgment and  thought content normal.  Vitals reviewed.    Lab Results  Component Value Date   WBC 5.8 05/27/2018   HGB 14.5 05/27/2018   HCT 46.9 (H) 05/27/2018   MCV 83.9 05/27/2018   PLT 320 05/27/2018   No results found for: FERRITIN, IRON, TIBC, UIBC, IRONPCTSAT Lab Results  Component Value Date   RBC 5.59 (H) 05/27/2018   Lab Results  Component Value Date   KPAFRELGTCHN 13.3 04/22/2018   LAMBDASER 7.7 04/22/2018   KAPLAMBRATIO 1.73 (H) 04/22/2018   Lab Results  Component Value Date   IGGSERUM 586 (L) 04/22/2018   IGA 33 (L) 04/22/2018   IGMSERUM 16 (L) 04/22/2018   Lab Results  Component Value Date   TOTALPROTELP 6.5 04/22/2018   ALBUMINELP 4.0 04/22/2018   A1GS 0.2 04/22/2018   A2GS 0.8 04/22/2018   BETS 0.9 04/22/2018   GAMS 0.5 04/22/2018   MSPIKE Not Observed 04/22/2018     Chemistry      Component Value Date/Time   NA 145 05/27/2018 0856   NA 142 03/10/2017 0954   NA 142 09/04/2016 0754   K 3.6 05/27/2018 0856   K 4.2 03/10/2017 0954   K 4.0 09/04/2016 0754   CL 109 (H) 05/27/2018 0856   CL 111 (H) 03/10/2017 0954   CO2 24 05/27/2018 0856   CO2 28 03/10/2017 0954   CO2 25 09/04/2016 0754   BUN 12 05/27/2018 0856   BUN 11 03/10/2017 0954   BUN 13.4 09/04/2016 0754   CREATININE 0.90 05/27/2018 0856   CREATININE 1.1 03/10/2017 0954   CREATININE 0.8 09/04/2016 0754      Component Value Date/Time   CALCIUM 10.5 (H) 05/27/2018 0856   CALCIUM 9.6 03/10/2017 0954   CALCIUM 9.7 09/04/2016 0754   ALKPHOS 62 05/27/2018 0856   ALKPHOS 46 03/10/2017 0954   ALKPHOS 54 09/04/2016 0754   AST 26 05/27/2018 0856   AST 20 09/04/2016 0754   ALT 18 05/27/2018 0856   ALT 20 03/10/2017 0954   ALT 14 09/04/2016 0754   BILITOT 0.6 05/27/2018 0856   BILITOT 0.39 09/04/2016 0754      Impression and Plan: Ms. Tami Ribas is a very pleasant 52 yo caucasian female with CLL - unmutated  IgVH.  Again, we are focusing on her quality of life.  As such, we are going to forego any  further therapy.  I do not have a problem with this.  I just want Ms. Townsel to have a "peace of mind".  We will get the CT scan done in a couple weeks.  She wants to have hers done on Saturday so she will not miss any work.  I will plan to see her back in 3 months.  This is what she would prefer.  I spent about 40 minutes with her today.  As always, we talked about a lot of topics.  She actually is quite a bit of fun to talk to.  She has a lot of experience and is very eloquent. Volanda Napoleon, MD 11/8/201910:14 AM

## 2018-05-28 LAB — IGG, IGA, IGM
IGA: 29 mg/dL — AB (ref 87–352)
IGG (IMMUNOGLOBIN G), SERUM: 620 mg/dL — AB (ref 700–1600)
IGM (IMMUNOGLOBULIN M), SRM: 17 mg/dL — AB (ref 26–217)

## 2018-05-30 LAB — PROTEIN ELECTROPHORESIS, SERUM
A/G RATIO SPE: 1.5 (ref 0.7–1.7)
ALPHA-2-GLOBULIN: 0.9 g/dL (ref 0.4–1.0)
Albumin ELP: 4 g/dL (ref 2.9–4.4)
Alpha-1-Globulin: 0.2 g/dL (ref 0.0–0.4)
Beta Globulin: 1 g/dL (ref 0.7–1.3)
GLOBULIN, TOTAL: 2.7 g/dL (ref 2.2–3.9)
Gamma Globulin: 0.5 g/dL (ref 0.4–1.8)
TOTAL PROTEIN ELP: 6.7 g/dL (ref 6.0–8.5)

## 2018-05-30 LAB — KAPPA/LAMBDA LIGHT CHAINS
Kappa free light chain: 16.2 mg/L (ref 3.3–19.4)
Kappa, lambda light chain ratio: 1.91 — ABNORMAL HIGH (ref 0.26–1.65)
LAMDA FREE LIGHT CHAINS: 8.5 mg/L (ref 5.7–26.3)

## 2018-06-01 ENCOUNTER — Encounter: Payer: Self-pay | Admitting: General Practice

## 2018-06-01 ENCOUNTER — Ambulatory Visit (INDEPENDENT_AMBULATORY_CARE_PROVIDER_SITE_OTHER): Payer: Medicare Other | Admitting: Psychiatry

## 2018-06-01 ENCOUNTER — Ambulatory Visit: Payer: Medicare Other | Admitting: Psychiatry

## 2018-06-01 ENCOUNTER — Telehealth: Payer: Self-pay | Admitting: Hematology & Oncology

## 2018-06-01 DIAGNOSIS — F4310 Post-traumatic stress disorder, unspecified: Secondary | ICD-10-CM | POA: Diagnosis not present

## 2018-06-01 DIAGNOSIS — F3173 Bipolar disorder, in partial remission, most recent episode manic: Secondary | ICD-10-CM

## 2018-06-01 NOTE — Telephone Encounter (Signed)
Spoke with patient regarding scheduling her CT scans/ she asked for date of 11/23 and early morning appt also [facility] was requested

## 2018-06-01 NOTE — Progress Notes (Signed)
[place] Note  Met with Morgan [name] in my office for an hour session of spiritual direction and theological reflection following her appt with Dr Ouida Sills. Morgan [name] is processing her desire to live and to support her mental health in the midst of struggling with how her CLL tx compromised her mental health. This is leading to significant anger and frustration. At the same time, Morgan [name] [place] that she could enhance her career development through such a move. 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 She knows to contact chaplain as needed via phone, email, and visits.   [place], [place], [facility] Pager [phone] Voicemail [phone]

## 2018-06-05 ENCOUNTER — Ambulatory Visit
Admission: RE | Admit: 2018-06-05 | Discharge: 2018-06-05 | Disposition: A | Discharge status: Home / Self Care | Payer: Medicare Other | Source: Ambulatory Visit | Attending: Diagnostic Neuroimaging | Admitting: Diagnostic Neuroimaging

## 2018-06-05 DIAGNOSIS — R51 Headache: Principal | ICD-10-CM

## 2018-06-05 DIAGNOSIS — R519 Headache, unspecified: Secondary | ICD-10-CM

## 2018-06-05 MED ORDER — GADOBENATE DIMEGLUMINE 529 MG/ML IV SOLN
15.0000 mL | Freq: Once | INTRAVENOUS | Status: AC | PRN
  Administered 2018-06-05: 15 mL via INTRAVENOUS

## 2018-06-07 ENCOUNTER — Other Ambulatory Visit (HOSPITAL_BASED_OUTPATIENT_CLINIC_OR_DEPARTMENT_OTHER): Payer: Self-pay

## 2018-06-08 ENCOUNTER — Ambulatory Visit: Payer: Medicare Other | Admitting: Psychiatry

## 2018-06-10 ENCOUNTER — Telehealth: Payer: Self-pay | Admitting: Diagnostic Neuroimaging

## 2018-06-10 ENCOUNTER — Telehealth: Payer: Self-pay | Admitting: *Deleted

## 2018-06-10 NOTE — Telephone Encounter (Signed)
I called patient. No answer.   #3 item on report is a benign finding (osteoma) and similar and stable finding from 2016. I reviewed images and prior report. I do not think it is likely related to new symptoms.   -VRP

## 2018-06-10 NOTE — Telephone Encounter (Signed)
Called and spoke with pt about MRI results. She verbalized understanding but was concerned about #3 under impression: There is a stable left-sided osteoma of the skull.  She states this is new finding. She wants this further explained. Feels this could possibly explain some of her sx. Advised I will send message to Dr. Leta Baptist and call back Monday at the latest to let her know. She verbalized understanding.

## 2018-06-10 NOTE — Telephone Encounter (Signed)
-----   Message from Penni Bombard, MD sent at 06/10/2018 12:57 PM EST ----- Stable scan from prior. No new findings. -VRP

## 2018-06-10 NOTE — Telephone Encounter (Signed)
Stable scan. -VRP

## 2018-06-10 NOTE — Telephone Encounter (Signed)
Pt states she sees MRI results on My chart, she'd like a call to discuss

## 2018-06-11 ENCOUNTER — Encounter (HOSPITAL_BASED_OUTPATIENT_CLINIC_OR_DEPARTMENT_OTHER): Payer: Self-pay

## 2018-06-11 ENCOUNTER — Ambulatory Visit (HOSPITAL_BASED_OUTPATIENT_CLINIC_OR_DEPARTMENT_OTHER)
Admission: RE | Admit: 2018-06-11 | Discharge: 2018-06-11 | Disposition: A | Discharge status: Home / Self Care | Payer: Medicare Other | Source: Ambulatory Visit | Attending: Hematology & Oncology | Admitting: Hematology & Oncology

## 2018-06-11 DIAGNOSIS — R918 Other nonspecific abnormal finding of lung field: Secondary | ICD-10-CM | POA: Diagnosis not present

## 2018-06-11 DIAGNOSIS — D215 Benign neoplasm of connective and other soft tissue of pelvis: Secondary | ICD-10-CM | POA: Diagnosis not present

## 2018-06-11 DIAGNOSIS — C9110 Chronic lymphocytic leukemia of B-cell type not having achieved remission: Secondary | ICD-10-CM | POA: Diagnosis not present

## 2018-06-11 MED ORDER — IOPAMIDOL (ISOVUE-300) INJECTION 61%
100.0000 mL | Freq: Once | INTRAVENOUS | Status: AC | PRN
  Administered 2018-06-11: 100 mL via INTRAVENOUS

## 2018-06-13 ENCOUNTER — Other Ambulatory Visit (HOSPITAL_BASED_OUTPATIENT_CLINIC_OR_DEPARTMENT_OTHER): Payer: Self-pay

## 2018-06-13 NOTE — Telephone Encounter (Addendum)
Spoke with patient and advised her of Dr Gladstone Lighter reply. She stated that the impression result she meant to ask about was #2. This RN reviewed #2. The patient stated she had googled those results and wanted more details. She stated it looks like they can be prevented. This RN advised that controlling migraines,  BP, cholesterol, having overall good diet, health and exercise would help. Advised her that our brain ages just like the rest of our body, so some changes will occur as we age. She stated her cholesterol went up after she started treatment for CLL. This RN advised if she thinks that has contributed to her results, she should discuss with Dr Marin Olp, oncologist.  She stated she has stopped those treatments for various reasons. She then thanked this RN for explanation and call.

## 2018-06-14 ENCOUNTER — Ambulatory Visit: Payer: Medicare Other | Admitting: Neurology

## 2018-06-15 ENCOUNTER — Ambulatory Visit (INDEPENDENT_AMBULATORY_CARE_PROVIDER_SITE_OTHER): Payer: Medicare Other | Admitting: Psychiatry

## 2018-06-15 DIAGNOSIS — F4310 Post-traumatic stress disorder, unspecified: Secondary | ICD-10-CM | POA: Diagnosis not present

## 2018-06-15 DIAGNOSIS — F3173 Bipolar disorder, in partial remission, most recent episode manic: Secondary | ICD-10-CM | POA: Diagnosis not present

## 2018-06-17 ENCOUNTER — Ambulatory Visit (INDEPENDENT_AMBULATORY_CARE_PROVIDER_SITE_OTHER): Payer: Medicare Other | Admitting: Internal Medicine

## 2018-06-17 VITALS — BP 110/84 | HR 105 | Temp 100.6°F | Resp 18 | Wt 158.0 lb

## 2018-06-17 DIAGNOSIS — J069 Acute upper respiratory infection, unspecified: Secondary | ICD-10-CM

## 2018-06-17 DIAGNOSIS — R509 Fever, unspecified: Secondary | ICD-10-CM

## 2018-06-17 DIAGNOSIS — J029 Acute pharyngitis, unspecified: Secondary | ICD-10-CM

## 2018-06-17 LAB — POCT RAPID STREP A (OFFICE): RAPID STREP A SCREEN: NEGATIVE

## 2018-06-17 LAB — POCT INFLUENZA A/B
INFLUENZA B, POC: NEGATIVE
Influenza A, POC: NEGATIVE

## 2018-06-17 MED ORDER — CEFDINIR 300 MG PO CAPS: 300.0000 mg | ORAL_CAPSULE | Freq: Two times a day (BID) | ORAL | 0 refills | Status: DC

## 2018-06-17 MED ORDER — FLUTICASONE FUROATE-VILANTEROL 100-25 MCG/INH IN AEPB: 1.0000 | INHALATION_SPRAY | Freq: Every day | RESPIRATORY_TRACT | 5 refills | Status: DC

## 2018-06-17 MED ORDER — ALBUTEROL SULFATE HFA 108 (90 BASE) MCG/ACT IN AERS: 1.0000 | INHALATION_SPRAY | Freq: Four times a day (QID) | RESPIRATORY_TRACT | 3 refills | Status: DC | PRN

## 2018-06-17 NOTE — Progress Notes (Signed)
Subjective:    Patient ID: Morgan [name], female    DOB: 1966-06-13, 52 y.o.   MRN: [id number]  HPI She is here for an acute visit for cold symptoms.  Her symptoms started Tuesday - three days ago.   She is experiencing decreased appetite, fever/chills, fatigue, nasal congestion, sore throat, chest tightness, wet sounding cough, SOB and headaches.  She denies ear pain, sinus pain and wheeze.    She has CLL and has recently stopped treatment, but will be restarting treatment.    She has drinking lots of tea and water.    Medications and allergies reviewed with patient and updated if appropriate.  Patient Active Problem List   Diagnosis Date Noted  . Dysfunction of right eustachian tube 12/03/2017  . Piriformis syndrome of right side 04/22/2017  . Lateral epicondylitis of left elbow 04/06/2017  . Solitary pulmonary nodule 10/29/2016  . Chronic lymphocytic leukemia ([place]) 07/31/2016  . Mass in neck 04/17/2016  . Abdominal mass 04/17/2016  . Asthma 12/12/2015  . Multiple environmental allergies 12/12/2015  . Chronic lower back pain 09/12/2015  . Bipolar 1 disorder, depressed ([place]) 08/27/2015    Class: Chronic  . Other specified hypothyroidism 08/23/2015  . Borderline personality disorder ([place]) 08/15/2015  . Severe benzodiazepine use disorder ([place]) 08/15/2015  . Alcohol use disorder, moderate, dependence ([place]) 08/15/2015  . PTSD (post-traumatic stress disorder) 08/15/2015  . History of bipolar disorder 08/15/2015  . Pre-syncope 05/17/2015  . Skull deformity 05/17/2015  . Patellofemoral syndrome of both knees 05/02/2015    Current Outpatient Medications on File Prior to Visit  Medication Sig Dispense Refill  . acetaminophen (TYLENOL) 500 MG tablet Take 1,000 mg by mouth every 6 (six) hours as needed for mild pain.    Marland Kitchen albuterol (PROVENTIL HFA;VENTOLIN HFA) 108 (90 Base) MCG/ACT inhaler Inhale 1-2 puffs into the lungs every 6 (six) hours as needed for wheezing or shortness  of breath. 1 each 3  . ALPRAZolam (XANAX) 0.5 MG tablet Take 1 tablet (0.5 mg total) by mouth as needed for anxiety (for sedation before MRI scan; take 1 hour before scan; may repeat 15 min before scan). 3 tablet 0  . diphenhydrAMINE (BENADRYL ALLERGY) 25 mg capsule Take 25 mg by mouth every 6 (six) hours as needed.    Marland Kitchen EPINEPHrine 0.3 mg/0.3 mL IJ SOAJ injection Inject 0.3 mg into the muscle as needed (allergic reaction).     . hyoscyamine (LEVSIN, ANASPAZ) 0.125 MG tablet Take 1 tablet (0.125 mg total) by mouth as needed for cramping. Reported on 09/24/2015 10 tablet 2  . Levothyroxine Sodium 25 MCG CAPS Take 1 capsule (25 mcg total) by mouth daily before breakfast. 30 capsule 3  . rizatriptan (MAXALT-MLT) 10 MG disintegrating tablet Take 1 tablet (10 mg total) by mouth as needed for migraine. May repeat in 2 hours if needed 9 tablet 11  . topiramate (TOPAMAX) 50 MG tablet Take 1 tablet (50 mg total) by mouth 2 (two) times daily. 60 tablet 12   No current facility-administered medications on file prior to visit.     Past Medical History:  Diagnosis Date  . Anxiety   . Arthritis   . Asthma   . Bipolar disorder ([place])   . Bulging lumbar disc 07/19/05  . Chronic headaches   . Chronic lymphocytic leukemia ([place]) 07/31/2016  . Colon polyps   . Degenerative disorder of bone   . Depression   . History of borderline personality disorder   . Hypothyroidism 2007  Developed after use of Lithium  . IBS (irritable bowel syndrome) 1995  . Pneumonia   . Proctitis   . Substance abuse ([place])    ativan last month    Past Surgical History:  Procedure Laterality Date  . ABDOMINAL HYSTERECTOMY    . APPENDECTOMY    . BUNIONECTOMY Right 06/2012  . CHOLECYSTECTOMY    . OOPHORECTOMY    . SHOULDER OPEN ROTATOR CUFF REPAIR Right 03/2010  . TUBAL LIGATION      Social History   Socioeconomic History  . Marital status: Divorced    Spouse name: Not on file  . Number of children: 2  . Years of  education: 55  . Highest education level: Not on file  Occupational History  . Occupation: Unemployed  Social Needs  . Financial resource strain: Not on file  . Food insecurity:    Worry: Not on file    Inability: Not on file  . Transportation needs:    Medical: Not on file    Non-medical: Not on file  Tobacco Use  . Smoking status: Former Smoker    Last attempt to quit: 07/20/1980    Years since quitting: 37.9  . Smokeless tobacco: Never Used  Substance and Sexual Activity  . Alcohol use: No    Alcohol/week: 0.0 standard drinks    Comment: unsure  . Drug use: Yes    Types: Benzodiazepines  . Sexual activity: Never    Birth control/protection: Surgical    Comment: intercourse age unknown,sexual partners less than 5  Lifestyle  . Physical activity:    Days per week: Not on file    Minutes per session: Not on file  . Stress: Not on file  Relationships  . Social connections:    Talks on phone: Not on file    Gets together: Not on file    Attends religious service: Not on file    Active member of club or organization: Not on file    Attends meetings of clubs or organizations: Not on file    Relationship status: Not on file  Other Topics Concern  . Not on file  Social History Narrative   Fun: [place], travel, music, writing, walking, hiking, volunteering   Denies religious beliefs effecting health care.    Feels safe at home.    Divorced, Works at [employer].  Children 2.  Lives home alone.    Family History  Problem Relation Age of Onset  . Depression Mother   . Hypertension Mother   . Post-traumatic stress disorder Sister   . Alcohol abuse Brother   . Drug abuse Brother   . Post-traumatic stress disorder Brother   . Thyroid disease Father   . Alzheimer's disease Father   . COPD Maternal Grandmother   . Heart disease Maternal Grandfather   . Arthritis Paternal Grandmother   . Diabetes Paternal Grandmother   . Depression Paternal Grandmother   .  Alzheimer's disease Paternal Grandmother   . Heart disease Paternal Grandfather   . Coronary artery disease Paternal Grandfather   . Alcohol abuse Paternal Grandfather   . Colon cancer Neg Hx   . Breast cancer Neg Hx     Review of Systems  Constitutional: Positive for appetite change, chills, fatigue and fever.  HENT: Positive for congestion and sore throat. Negative for ear pain and sinus pain.   Respiratory: Positive for cough (wet ), chest tightness and shortness of breath. Negative for wheezing.   Neurological: Positive for headaches.  Objective:   Vitals:   06/17/18 1057  BP: 110/84  Pulse: (!) 105  Resp: 18  Temp: (!) 100.6 F (38.1 C)  SpO2: 96%   Filed Weights   06/17/18 1057  Weight: 158 lb (71.7 kg)   Body mass index is 27.12 kg/m.  Wt Readings from Last 3 Encounters:  06/17/18 158 lb (71.7 kg)  05/27/18 162 lb (73.5 kg)  05/23/18 164 lb (74.4 kg)     Physical Exam GENERAL APPEARANCE: Appears stated age, well appearing, NAD EYES: conjunctiva clear, no icterus HEENT: bilateral tympanic membranes and ear canals normal, oropharynx with mild erythema, no thyromegaly, trachea midline, no cervical or supraclavicular lymphadenopathy LUNGS: Clear to auscultation without wheeze or crackles, unlabored breathing, good air entry bilaterally CARDIOVASCULAR: Normal S1,S2 without murmurs, no edema SKIN: warm, dry        Assessment & Plan:   See Problem List for Assessment and Plan of chronic medical problems.

## 2018-06-17 NOTE — Patient Instructions (Signed)
Take the antibiotic as prescribed - complete the entire course.    Use the Breo daily and albuterol as needed.  Continue over the counter cold medication, advil and tylenol.  Increase your fluids and rest.    Call if no improvement

## 2018-06-18 ENCOUNTER — Encounter: Payer: Self-pay | Admitting: Internal Medicine

## 2018-06-18 DIAGNOSIS — R509 Fever, unspecified: Secondary | ICD-10-CM | POA: Insufficient documentation

## 2018-06-18 DIAGNOSIS — J029 Acute pharyngitis, unspecified: Secondary | ICD-10-CM | POA: Insufficient documentation

## 2018-06-18 DIAGNOSIS — J069 Acute upper respiratory infection, unspecified: Secondary | ICD-10-CM | POA: Insufficient documentation

## 2018-06-18 NOTE — Assessment & Plan Note (Deleted)
Rapid strep negative, rapid flu negative

## 2018-06-18 NOTE — Assessment & Plan Note (Signed)
Rapid strep negative, rapid flu negative Uncertain if this is viral or bacterial, but since she is immunocompromised I will start an antibiotic - Omnicef BID x 10 days Albuterol inhaler as needed Breo daily otc cold medications Rest, fluids Call if no improvement

## 2018-06-20 ENCOUNTER — Encounter: Payer: Self-pay | Admitting: General Practice

## 2018-06-21 NOTE — Progress Notes (Signed)
[place] Spiritual Care Note  Received and returned call from [facility] for pastoral listening and prayer support re recent stressors of severe cold (per pt: ? pneumonia) and consequent loss of hours/holiday bonus at work. [place] finds much meaning and comfort in chaplain as conversation partner and intercessor.   [place], [place], [facility] Pager [phone] Voicemail [phone]

## 2018-06-21 NOTE — Progress Notes (Addendum)
[facility] [place], [place], [place] [address] [phone]  Date:  06/22/2018   Name:  Morgan [name]   DOB:  06-Aug-1965   MRN:  [id number]  PCP:  Darreld Mclean, MD    Chief Complaint: Cough (dry and prod(green),chest congestion,fever,-sxs started on last Tues)   History of Present Illness:  Morgan [name] is a 52 y.o. very pleasant female patient who presents with the following:  History of CLL, mental illness/ anxiety Here today with concern of possible pneumonia She has noted illness- was seen by Dr. Quay Burow on 11/29 Rapid strep negative, rapid flu negative Uncertain if this is viral or bacterial, but since she is immunocompromised I will start an antibiotic - Omnicef BID x 10 days Albuterol inhaler as needed Breo daily otc cold medications Rest, fluids  She notes that she is overall feeling better except for coughing, sometimes in fits She is using Breo inhaler  This morning she felt faint while she was making her tea She laid down and felt a bit better She has felt nauseated but no vomiting She has not felt like eating much but is able to take po  She did have a fever up until a couple of days ago- Tmax 102  Her CLL is quiet currently  She saw Dr. Marin Olp last month and they are monitoring her CLL for now  Per his most recent note:              Interim History: Morgan [name] is here today for follow-up.she has decided to forego any further therapy.  She feels that the Rituxan in the [facility] are just causing too much problems for her psychological issues.  She has bipolar disorder.  She just feels that the chemicals that are being used just are causing too much of an imbalance. I cannot argue with her.  I want her to have quality of life.  Quality of life is what she wants. I think that we have achieved a very good response to date.  Her last CT scans that showed marked decrease in adenopathy.  Her  white cell count is come back down to normal. She thinks that her CLL is already coming back.  She said that she feels a lymph node on the left neck.  She also feels a lymph node in the left inguinal region. Patient Active Problem List   Diagnosis Date Noted   Fever 06/18/2018   Sore throat 06/18/2018   URI (upper respiratory infection) 06/18/2018   Dysfunction of right eustachian tube 12/03/2017   Piriformis syndrome of right side 04/22/2017   Lateral epicondylitis of left elbow 04/06/2017   Solitary pulmonary nodule 10/29/2016   Chronic lymphocytic leukemia ([place]) 07/31/2016   Mass in neck 04/17/2016   Abdominal mass 04/17/2016   Asthma 12/12/2015   Multiple environmental allergies 12/12/2015   Chronic lower back pain 09/12/2015   Bipolar 1 disorder, depressed ([place]) 08/27/2015    Class: Chronic   Other specified hypothyroidism 08/23/2015   Borderline personality disorder ([place]) 08/15/2015   Severe benzodiazepine use disorder ([place]) 08/15/2015   Alcohol use disorder, moderate, dependence ([place]) 08/15/2015   PTSD (post-traumatic stress disorder) 08/15/2015   History of bipolar disorder 08/15/2015   Pre-syncope 05/17/2015   Skull deformity 05/17/2015   Patellofemoral syndrome of both knees 05/02/2015    Past Medical History:  Diagnosis Date   Anxiety    Arthritis    Asthma  Bipolar disorder ([place])    Bulging lumbar disc 07/19/05   Chronic headaches    Chronic lymphocytic leukemia ([place]) 07/31/2016   Colon polyps    Degenerative disorder of bone    Depression    History of borderline personality disorder    Hypothyroidism 2007   Developed after use of Lithium   IBS (irritable bowel syndrome) 1995   Pneumonia    Proctitis    Substance abuse ([place])    ativan last month    Past Surgical History:  Procedure Laterality Date   ABDOMINAL HYSTERECTOMY     APPENDECTOMY     BUNIONECTOMY Right 06/2012   CHOLECYSTECTOMY      OOPHORECTOMY     SHOULDER OPEN ROTATOR CUFF REPAIR Right 03/2010   TUBAL LIGATION      Social History   Tobacco Use   Smoking status: Former Smoker    Last attempt to quit: 07/20/1980    Years since quitting: 37.9   Smokeless tobacco: Never Used  Substance Use Topics   Alcohol use: No    Alcohol/week: 0.0 standard drinks    Comment: unsure   Drug use: Yes    Types: Benzodiazepines    Family History  Problem Relation Age of Onset   Depression Mother    Hypertension Mother    Post-traumatic stress disorder Sister    Alcohol abuse Brother    Drug abuse Brother    Post-traumatic stress disorder Brother    Thyroid disease Father    Alzheimer's disease Father    COPD Maternal Grandmother    Heart disease Maternal Grandfather    Arthritis Paternal Grandmother    Diabetes Paternal Grandmother    Depression Paternal Grandmother    Alzheimer's disease Paternal Grandmother    Heart disease Paternal Grandfather    Coronary artery disease Paternal Grandfather    Alcohol abuse Paternal Grandfather    Colon cancer Neg Hx    Breast cancer Neg Hx     Allergies  Allergen Reactions   Aripiprazole Anaphylaxis and Swelling   Ciprofloxacin Shortness Of Breath   Lamotrigine Rash and Other (See Comments)   Nitrofurantoin Hives   Other Anaphylaxis, Hives and Swelling    Walnuts and pine nuts   Peanut Oil Anaphylaxis, Hives, Swelling and Rash   Amoxicillin Hives and Rash    Has patient had a PCN reaction causing immediate rash, facial/tongue/throat swelling, SOB or lightheadedness with hypotension: Yes Has patient had a PCN reaction causing severe rash involving mucus membranes or skin necrosis: No Has patient had a PCN reaction that required hospitalization: No Has patient had a PCN reaction occurring within the last 10 years: No If all of the above answers are "NO", then may proceed with Cephalosporin use.   Doxycycline Hives and Rash   Latex Rash  and Other (See Comments)   Meloxicam Itching and Other (See Comments)    Induces mania Interacts with Lithium   Naproxen Hives and Other (See Comments)    Interacts with lithium   Pramipexole Hives and Rash   Prednisone Other (See Comments)    Interacts with Lithium   Risperidone Hives and Rash   Sulfa Antibiotics Rash    "that leaves scarring"   Tape Itching and Rash    Steri-Strips   Cortisone Other (See Comments)    Exacerbates the mania of her bipolar   Percocet [Oxycodone-Acetaminophen] Other (See Comments)    Severe dizziness    Medication list has been reviewed and updated.  Current Outpatient Medications on  File Prior to Visit  Medication Sig Dispense Refill   acetaminophen (TYLENOL) 500 MG tablet Take 1,000 mg by mouth every 6 (six) hours as needed for mild pain.     albuterol (PROVENTIL HFA;VENTOLIN HFA) 108 (90 Base) MCG/ACT inhaler Inhale 1-2 puffs into the lungs every 6 (six) hours as needed for wheezing or shortness of breath. 1 each 3   cefdinir (OMNICEF) 300 MG capsule Take 1 capsule (300 mg total) by mouth 2 (two) times daily for 10 days. 20 capsule 0   diphenhydrAMINE (BENADRYL ALLERGY) 25 mg capsule Take 25 mg by mouth every 6 (six) hours as needed.     EPINEPHrine 0.3 mg/0.3 mL IJ SOAJ injection Inject 0.3 mg into the muscle as needed (allergic reaction).      fluticasone furoate-vilanterol (BREO ELLIPTA) 100-25 MCG/INH AEPB Inhale 1 puff into the lungs daily. 1 each 5   hyoscyamine (LEVSIN, ANASPAZ) 0.125 MG tablet Take 1 tablet (0.125 mg total) by mouth as needed for cramping. Reported on 09/24/2015 10 tablet 2   rizatriptan (MAXALT-MLT) 10 MG disintegrating tablet Take 1 tablet (10 mg total) by mouth as needed for migraine. May repeat in 2 hours if needed 9 tablet 11   topiramate (TOPAMAX) 50 MG tablet Take 1 tablet (50 mg total) by mouth 2 (two) times daily. 60 tablet 12   Levothyroxine Sodium 25 MCG CAPS Take 1 capsule (25 mcg total) by  mouth daily before breakfast. (Patient not taking: Reported on 06/22/2018) 30 capsule 3   No current facility-administered medications on file prior to visit.     Review of Systems:  As per HPI- otherwise negative.   Physical Examination: Vitals:   06/22/18 1510  BP: 121/71  Pulse: 75  Temp: 98.4 F (36.9 C)  SpO2: 99%   Vitals:   06/22/18 1510  Weight: 161 lb 12.8 oz (73.4 kg)  Height: 5' 3.5" (1.613 m)   Body mass index is 28.21 kg/m. Ideal Body Weight: Weight in (lb) to have BMI = 25: 143.1  GEN: WDWN, NAD, Non-toxic, A & O x 3, looks well  HEENT: Atraumatic, Normocephalic. Neck supple. No masses, No LAD. Bilateral TM wnl, oropharynx normal.  PEERL,EOMI.   Ears and Nose: No external deformity. CV: RRR, No M/G/R. No JVD. No thrill. No extra heart sounds. PULM: CTA B, no wheezes, crackles, rhonchi. No retractions. No resp. distress. No accessory muscle use. ABD: S, NT, ND, +BS. No rebound. No HSM. EXTR: No c/c/e NEURO Normal gait.  PSYCH: Normally interactive. Conversant. Not depressed or anxious appearing.  Calm demeanor.   Results for orders placed or performed in visit on 06/22/18  POCT glucose (manual entry)  Result Value Ref Range   POC Glucose 83 70 - 99 mg/dl   She had complaint of feeling weak- Gave her a chocolate milk that I had on hand and she felt better  Assessment and Plan: Cough - Plan: DG Chest 2 View  Fever, unspecified - Plan: DG Chest 2 View, CBC  Feeling faint - Plan: POCT glucose (manual entry)  Thyroid disorder - Plan: TSH  Morgan [name] is here today with concern of pneumonia.  She is already taking Omnicef from a provider she saw last week.  Her fevers have resolved and really she is feeling better.  However she was concerned that she might have pneumonia so we did get a chest x-ray today.  Her chest x-ray is normal which is reassuring. We went over her report in the office today  [place] plans  to rest and work on increasing her oral intake.  She  will let me know if she is not feeling better in the next couple of days, sooner she is feeling worse. She does have CLL, so we will check a blood count for her today.  We will also check a periodic thyroid level for her today.  Signed Lamar Blinks, MD  Received her chest film report; CHEST - 2 VIEW  COMPARISON:  Most recent chest x-ray 06/18/2016, CT chest 06/11/2018  FINDINGS: Cardiomediastinal silhouette unchanged in size and contour. No evidence of central vascular congestion. No pneumothorax or pleural effusion. No confluent airspace disease. No acute displaced fracture.  IMPRESSION: Negative for acute cardiopulmonary disease   Received her labs 12/5 Results for orders placed or performed in visit on 06/22/18  CBC  Result Value Ref Range   WBC 2.7 (L) 4.0 - 10.5 K/uL   RBC 5.64 (H) 3.87 - 5.11 Mil/uL   Platelets 204.0 150.0 - 400.0 K/uL   Hemoglobin 15.1 (H) 12.0 - 15.0 g/dL   HCT 45.8 36.0 - 46.0 %   MCV 81.3 78.0 - 100.0 fl   MCHC 33.0 30.0 - 36.0 g/dL   RDW 14.0 11.5 - 15.5 %  TSH  Result Value Ref Range   TSH 3.16 0.35 - 4.50 uIU/mL  POCT glucose (manual entry)  Result Value Ref Range   POC Glucose 83 70 - 99 mg/dl   Message to pt

## 2018-06-22 ENCOUNTER — Ambulatory Visit (HOSPITAL_BASED_OUTPATIENT_CLINIC_OR_DEPARTMENT_OTHER)
Admission: RE | Admit: 2018-06-22 | Discharge: 2018-06-22 | Disposition: A | Discharge status: Home / Self Care | Payer: Medicare Other | Source: Ambulatory Visit | Attending: Family Medicine | Admitting: Family Medicine

## 2018-06-22 ENCOUNTER — Ambulatory Visit (INDEPENDENT_AMBULATORY_CARE_PROVIDER_SITE_OTHER): Payer: Medicare Other | Admitting: Family Medicine

## 2018-06-22 ENCOUNTER — Encounter: Payer: Self-pay | Admitting: Family Medicine

## 2018-06-22 VITALS — BP 121/71 | HR 75 | Temp 98.4°F | Ht 63.5 in | Wt 161.8 lb

## 2018-06-22 DIAGNOSIS — R42 Dizziness and giddiness: Secondary | ICD-10-CM

## 2018-06-22 DIAGNOSIS — R509 Fever, unspecified: Secondary | ICD-10-CM | POA: Insufficient documentation

## 2018-06-22 DIAGNOSIS — E079 Disorder of thyroid, unspecified: Secondary | ICD-10-CM | POA: Diagnosis not present

## 2018-06-22 DIAGNOSIS — R05 Cough: Secondary | ICD-10-CM

## 2018-06-22 DIAGNOSIS — R059 Cough, unspecified: Secondary | ICD-10-CM

## 2018-06-22 LAB — GLUCOSE, POCT (MANUAL RESULT ENTRY): POC Glucose: 83 mg/dl (ref 70–99)

## 2018-06-22 NOTE — Patient Instructions (Signed)
It was good to see you today- let me know if you are not continuing to get better Try to eat some I'll be in touch with your labs asap

## 2018-06-23 ENCOUNTER — Encounter: Payer: Self-pay | Admitting: Hematology & Oncology

## 2018-06-23 ENCOUNTER — Encounter: Payer: Self-pay | Admitting: Family Medicine

## 2018-06-23 LAB — CBC
HCT: 45.8 % (ref 36.0–46.0)
Hemoglobin: 15.1 g/dL — ABNORMAL HIGH (ref 12.0–15.0)
MCHC: 33 g/dL (ref 30.0–36.0)
MCV: 81.3 fl (ref 78.0–100.0)
Platelets: 204 10*3/uL (ref 150.0–400.0)
RBC: 5.64 Mil/uL — ABNORMAL HIGH (ref 3.87–5.11)
RDW: 14 % (ref 11.5–15.5)
WBC: 2.7 10*3/uL — ABNORMAL LOW (ref 4.0–10.5)

## 2018-06-23 LAB — TSH: TSH: 3.16 u[IU]/mL (ref 0.35–4.50)

## 2018-06-24 ENCOUNTER — Ambulatory Visit: Payer: Self-pay | Admitting: Family

## 2018-06-24 ENCOUNTER — Encounter: Payer: Self-pay | Admitting: General Practice

## 2018-06-24 ENCOUNTER — Other Ambulatory Visit: Payer: Self-pay

## 2018-06-24 ENCOUNTER — Ambulatory Visit: Payer: Self-pay

## 2018-06-24 NOTE — Progress Notes (Signed)
[facility] Spiritual Care Note  Received and returned call requesting support for processing new theological understandings and insights. Provided empathic listening and pastoral reflection. Morgan [name] is working hard to seek God's will and clarity regarding her disease and living well for divine purpose. She utilizes chaplain support regularly for spiritual direction and prayer care.   [place], [place], [facility] Pager [phone] Voicemail [phone]

## 2018-06-29 ENCOUNTER — Ambulatory Visit (INDEPENDENT_AMBULATORY_CARE_PROVIDER_SITE_OTHER): Payer: Medicare Other | Admitting: Psychiatry

## 2018-06-29 DIAGNOSIS — F4310 Post-traumatic stress disorder, unspecified: Secondary | ICD-10-CM

## 2018-06-29 DIAGNOSIS — F3173 Bipolar disorder, in partial remission, most recent episode manic: Secondary | ICD-10-CM | POA: Diagnosis not present

## 2018-07-03 NOTE — Progress Notes (Addendum)
[facility] [address], [address] [place], [place] [address] [phone]  Date:  07/06/2018   Name:  Morgan [name]   DOB:  10-Feb-1966   MRN:  [id number]  PCP:  Morgan Cables, MD    Chief Complaint: Lab Work Follow Up and Urinary Tract Infection (dysuria, cloudy urine, chronic uti, leakage, back pain)   History of Present Illness:  Morgan [name] is a 52 y.o. very pleasant female patient who presents with the following: History of CLL, mental illness/ anxieity following up today I saw her just recently on 12/4 with a cough.  Chest x-ray was negative and she was already on omnicef from another provider, She was also noted to have mild leukopenia at that time  She needs to repeat her CBC today She also feels like she still has a UTI- she has suffered form this off and on since September.  She notes that the symptoms have worsened the last couple of days.  She has dysuria and frequency. No blood in her urine  She did have an E. coli UTI culture proven twice during September. Morgan [name] has several different drug allergies, and her most recent E. coli was also resistant to several antibiotics.  This makes in a UTI somewhat difficult to treat.  No fever  Morgan [name] is able to tolerate cephalosporins, though she has not taken Keflex very recently.  She did take Omnicef just recently and did okay.  Patient Active Problem List   Diagnosis Date Noted  . Fever 06/18/2018  . Sore throat 06/18/2018  . URI (upper respiratory infection) 06/18/2018  . Dysfunction of right eustachian tube 12/03/2017  . Piriformis syndrome of right side 04/22/2017  . Lateral epicondylitis of left elbow 04/06/2017  . Solitary pulmonary nodule 10/29/2016  . Chronic lymphocytic leukemia (HCC) 07/31/2016  . Mass in neck 04/17/2016  . Abdominal mass 04/17/2016  . Asthma 12/12/2015  . Multiple environmental allergies 12/12/2015  . Chronic lower back pain  09/12/2015  . Bipolar 1 disorder, depressed (HCC) 08/27/2015    Class: Chronic  . Other specified hypothyroidism 08/23/2015  . Borderline personality disorder (HCC) 08/15/2015  . Severe benzodiazepine use disorder (HCC) 08/15/2015  . Alcohol use disorder, moderate, dependence (HCC) 08/15/2015  . PTSD (post-traumatic stress disorder) 08/15/2015  . History of bipolar disorder 08/15/2015  . Pre-syncope 05/17/2015  . Skull deformity 05/17/2015  . Patellofemoral syndrome of both knees 05/02/2015    Past Medical History:  Diagnosis Date  . Anxiety   . Arthritis   . Asthma   . Bipolar disorder (HCC)   . Bulging lumbar disc 07/19/05  . Chronic headaches   . Chronic lymphocytic leukemia (HCC) 07/31/2016  . Colon polyps   . Degenerative disorder of bone   . Depression   . History of borderline personality disorder   . Hypothyroidism 2007   Developed after use of Lithium  . IBS (irritable bowel syndrome) 1995  . Pneumonia   . Proctitis   . Substance abuse (HCC)    ativan last month    Past Surgical History:  Procedure Laterality Date  . ABDOMINAL HYSTERECTOMY    . APPENDECTOMY    . BUNIONECTOMY Right 06/2012  . CHOLECYSTECTOMY    . OOPHORECTOMY    . SHOULDER OPEN ROTATOR CUFF REPAIR Right 03/2010  . TUBAL LIGATION      Social History   Tobacco Use  . Smoking status: Former Smoker    Last attempt to  quit: 07/20/1980    Years since quitting: 37.9  . Smokeless tobacco: Never Used  Substance Use Topics  . Alcohol use: No    Alcohol/week: 0.0 standard drinks    Comment: unsure  . Drug use: Yes    Types: Benzodiazepines    Family History  Problem Relation Age of Onset  . Depression Mother   . Hypertension Mother   . Post-traumatic stress disorder Sister   . Alcohol abuse Brother   . Drug abuse Brother   . Post-traumatic stress disorder Brother   . Thyroid disease Father   . Alzheimer's disease Father   . COPD Maternal Grandmother   . Heart disease Maternal  Grandfather   . Arthritis Paternal Grandmother   . Diabetes Paternal Grandmother   . Depression Paternal Grandmother   . Alzheimer's disease Paternal Grandmother   . Heart disease Paternal Grandfather   . Coronary artery disease Paternal Grandfather   . Alcohol abuse Paternal Grandfather   . Colon cancer Neg Hx   . Breast cancer Neg Hx     Allergies  Allergen Reactions  . Aripiprazole Anaphylaxis and Swelling  . Ciprofloxacin Shortness Of Breath  . Lamotrigine Rash and Other (See Comments)  . Nitrofurantoin Hives  . Other Anaphylaxis, Hives and Swelling    Walnuts and pine nuts  . Peanut Oil Anaphylaxis, Hives, Swelling and Rash  . Amoxicillin Hives and Rash    Has patient had a PCN reaction causing immediate rash, facial/tongue/throat swelling, SOB or lightheadedness with hypotension: Yes Has patient had a PCN reaction causing severe rash involving mucus membranes or skin necrosis: No Has patient had a PCN reaction that required hospitalization: No Has patient had a PCN reaction occurring within the last 10 years: No If all of the above answers are "NO", then may proceed with Cephalosporin use.  Marland Kitchen Doxycycline Hives and Rash  . Latex Rash and Other (See Comments)  . Meloxicam Itching and Other (See Comments)    Induces mania Interacts with Lithium  . Naproxen Hives and Other (See Comments)    Interacts with lithium  . Pramipexole Hives and Rash  . Prednisone Other (See Comments)    Interacts with Lithium  . Risperidone Hives and Rash  . Sulfa Antibiotics Rash    "that leaves scarring"  . Tape Itching and Rash    Steri-StripsT  . Cortisone Other (See Comments)    Exacerbates the mania of her bipolar  . Percocet [Oxycodone-Acetaminophen] Other (See Comments)    Severe dizziness    Medication list has been reviewed and updated.  Current Outpatient Medications on File Prior to Visit  Medication Sig Dispense Refill  . acetaminophen (TYLENOL) 500 MG tablet Take 1,000 mg  by mouth every 6 (six) hours as needed for mild pain.    Marland Kitchen albuterol (PROVENTIL HFA;VENTOLIN HFA) 108 (90 Base) MCG/ACT inhaler Inhale 1-2 puffs into the lungs every 6 (six) hours as needed for wheezing or shortness of breath. 1 each 3  . diphenhydrAMINE (BENADRYL ALLERGY) 25 mg capsule Take 25 mg by mouth every 6 (six) hours as needed.    Marland Kitchen EPINEPHrine 0.3 mg/0.3 mL IJ SOAJ injection Inject 0.3 mg into the muscle as needed (allergic reaction).     . fluticasone furoate-vilanterol (BREO ELLIPTA) 100-25 MCG/INH AEPB Inhale 1 puff into the lungs daily. 1 each 5  . hyoscyamine (LEVSIN, ANASPAZ) 0.125 MG tablet Take 1 tablet (0.125 mg total) by mouth as needed for cramping. Reported on 09/24/2015 10 tablet 2  . Levothyroxine  Sodium 25 MCG CAPS Take 1 capsule (25 mcg total) by mouth daily before breakfast. 30 capsule 3  . rizatriptan (MAXALT-MLT) 10 MG disintegrating tablet Take 1 tablet (10 mg total) by mouth as needed for migraine. May repeat in 2 hours if needed 9 tablet 11  . topiramate (TOPAMAX) 50 MG tablet Take 1 tablet (50 mg total) by mouth 2 (two) times daily. 60 tablet 12   No current facility-administered medications on file prior to visit.     Review of Systems:  As per HPI- otherwise negative. Pulse Readings from Last 3 Encounters:  07/06/18 (!) 106  06/22/18 75  06/17/18 (!) 105   Admits to being anxious about her health.  Fisher is now wearing a mask and gloves whenever she is out in public.   Physical Examination: Vitals:   07/06/18 1051  BP: 140/80  Pulse: (!) 106  Resp: 16  SpO2: 98%   Vitals:   07/06/18 1051  Weight: 165 lb (74.8 kg)  Height: 5' 3.5" (1.613 m)   Body mass index is 28.77 kg/m. Ideal Body Weight: Weight in (lb) to have BMI = 25: 143.1  GEN: WDWN, NAD, Non-toxic, A & O x 3, normal weight,  HEENT: Atraumatic, Normocephalic. Neck supple. No masses, No LAD Bilateral TM wnl, oropharynx normal.  PEERL,EOMI.    looks well.. Ears and Nose: No external  deformity. CV: RRR, No M/G/R. No JVD. No thrill. No extra heart sounds. PULM: CTA B, no wheezes, crackles, rhonchi. No retractions. No resp. distress. No accessory muscle use. ABD: S, NT, ND, +BS. No rebound. No HSM. EXTR: No c/c/e NEURO Normal gait.  PSYCH: Normally interactive. Conversant. Not depressed or anxious appearing.  Calm demeanor.   Results for orders placed or performed in visit on 07/06/18  Urine Culture  Result Value Ref Range   MICRO NUMBER: [id number]    SPECIMEN QUALITY: Adequate    Sample Source NOT GIVEN    STATUS: FINAL    ISOLATE 1: Escherichia coli (A)       Susceptibility   Escherichia coli - URINE CULTURE, REFLEX    AMOX/CLAVULANIC >=32 Resistant     AMPICILLIN >=32 Resistant     AMPICILLIN/SULBACTAM 16 Intermediate     CEFAZOLIN* >=64 Resistant      * For uncomplicated UTI caused by E. coli,K. pneumoniae or P. mirabilis: Cefazolin issusceptible if MIC <32 mcg/mL and predictssusceptible to the oral agents cefaclor, cefdinir,cefpodoxime, cefprozil, cefuroxime, cephalexinand loracarbef.    CEFEPIME <=1 Sensitive     CEFTRIAXONE 16 Intermediate     CIPROFLOXACIN <=0.25 Sensitive     LEVOFLOXACIN <=0.12 Sensitive     ERTAPENEM <=0.5 Sensitive     GENTAMICIN <=1 Sensitive     IMIPENEM <=0.25 Sensitive     NITROFURANTOIN <=16 Sensitive     PIP/TAZO <=4 Sensitive     TOBRAMYCIN <=1 Sensitive     TRIMETH/SULFA* <=20 Sensitive      * For uncomplicated UTI caused by E. coli,K. pneumoniae or P. mirabilis: Cefazolin issusceptible if MIC <32 mcg/mL and predictssusceptible to the oral agents cefaclor, cefdinir,cefpodoxime, cefprozil, cefuroxime, cephalexinand loracarbef.Legend:S = Susceptible  I = IntermediateR = Resistant  NS = Not susceptible* = Not tested  NR = Not reported**NN = See antimicrobic comments  CBC with Differential/Platelet  Result Value Ref Range   WBC 7.2 4.0 - 10.5 K/uL   RBC 5.28 (H) 3.87 - 5.11 Mil/uL   Hemoglobin 14.2 12.0 - 15.0 g/dL   HCT 54.0  98.1 - 19.1 %  MCV 81.1 78.0 - 100.0 fl   MCHC 33.1 30.0 - 36.0 g/dL   RDW 40.1 02.7 - 25.3 %   Platelets 302.0 150.0 - 400.0 K/uL   Neutrophils Relative % 68.7 43.0 - 77.0 %   Lymphocytes Relative 21.1 12.0 - 46.0 %   Monocytes Relative 7.8 3.0 - 12.0 %   Eosinophils Relative 1.4 0.0 - 5.0 %   Basophils Relative 1.0 0.0 - 3.0 %   Neutro Abs 5.0 1.4 - 7.7 K/uL   Lymphs Abs 1.5 0.7 - 4.0 K/uL   Monocytes Absolute 0.6 0.1 - 1.0 K/uL   Eosinophils Absolute 0.1 0.0 - 0.7 K/uL   Basophils Absolute 0.1 0.0 - 0.1 K/uL  POCT Urinalysis Dipstick (Automated)  Result Value Ref Range   Color, UA yellow    Clarity, UA cloudy    Glucose, UA Negative Negative   Bilirubin, UA negative    Ketones, UA negative    Spec Grav, UA 1.010 1.010 - 1.025   Blood, UA negative    pH, UA 6.0 5.0 - 8.0   Protein, UA Negative Negative   Urobilinogen, UA 0.2 0.2 or 1.0 E.U./dL   Nitrite, UA negative    Leukocytes, UA Moderate (2+) (A) Negative    Assessment and Plan: Dysuria - Plan: POCT Urinalysis Dipstick (Automated), Urine Culture, fosfomycin (MONUROL) 3 g PACK, CANCELED: Urine Culture  Cellulitis of arm, right  Leukopenia, unspecified type - Plan: CBC with Differential/Platelet  Recheck CBC with differential for leukopenia. UA is again specially suspicious for a UTI.  However her treatment options are limited given her allergies and recent resistance pattern.  Suggested that we use fosfomycin for presumed E. coli UTI, however we are not sure of her insurance coverage.  I sent this prescription to the pharmacy downstairs, and asked her to let me know if it was too expensive.  Otherwise we will await her culture, this may give [place] other treatment options.  Signed Abbe Amsterdam, MD  Received her lab, white count is better Message to pt Results for orders placed or performed in visit on 07/06/18  Urine Culture  Result Value Ref Range   MICRO NUMBER: [id number]    SPECIMEN QUALITY: Adequate     Sample Source NOT GIVEN    STATUS: FINAL    ISOLATE 1: Escherichia coli (A)       Susceptibility   Escherichia coli - URINE CULTURE, REFLEX    AMOX/CLAVULANIC >=32 Resistant     AMPICILLIN >=32 Resistant     AMPICILLIN/SULBACTAM 16 Intermediate     CEFAZOLIN* >=64 Resistant      * For uncomplicated UTI caused by E. coli,K. pneumoniae or P. mirabilis: Cefazolin issusceptible if MIC <32 mcg/mL and predictssusceptible to the oral agents cefaclor, cefdinir,cefpodoxime, cefprozil, cefuroxime, cephalexinand loracarbef.    CEFEPIME <=1 Sensitive     CEFTRIAXONE 16 Intermediate     CIPROFLOXACIN <=0.25 Sensitive     LEVOFLOXACIN <=0.12 Sensitive     ERTAPENEM <=0.5 Sensitive     GENTAMICIN <=1 Sensitive     IMIPENEM <=0.25 Sensitive     NITROFURANTOIN <=16 Sensitive     PIP/TAZO <=4 Sensitive     TOBRAMYCIN <=1 Sensitive     TRIMETH/SULFA* <=20 Sensitive      * For uncomplicated UTI caused by E. coli,K. pneumoniae or P. mirabilis: Cefazolin issusceptible if MIC <32 mcg/mL and predictssusceptible to the oral agents cefaclor, cefdinir,cefpodoxime, cefprozil, cefuroxime, cephalexinand loracarbef.Legend:S = Susceptible  I = IntermediateR = Resistant  NS = Not  susceptible* = Not tested  NR = Not reported**NN = See antimicrobic comments  CBC with Differential/Platelet  Result Value Ref Range   WBC 7.2 4.0 - 10.5 K/uL   RBC 5.28 (H) 3.87 - 5.11 Mil/uL   Hemoglobin 14.2 12.0 - 15.0 g/dL   HCT 65.7 84.6 - 96.2 %   MCV 81.1 78.0 - 100.0 fl   MCHC 33.1 30.0 - 36.0 g/dL   RDW 95.2 84.1 - 32.4 %   Platelets 302.0 150.0 - 400.0 K/uL   Neutrophils Relative % 68.7 43.0 - 77.0 %   Lymphocytes Relative 21.1 12.0 - 46.0 %   Monocytes Relative 7.8 3.0 - 12.0 %   Eosinophils Relative 1.4 0.0 - 5.0 %   Basophils Relative 1.0 0.0 - 3.0 %   Neutro Abs 5.0 1.4 - 7.7 K/uL   Lymphs Abs 1.5 0.7 - 4.0 K/uL   Monocytes Absolute 0.6 0.1 - 1.0 K/uL   Eosinophils Absolute 0.1 0.0 - 0.7 K/uL   Basophils Absolute 0.1  0.0 - 0.1 K/uL  POCT Urinalysis Dipstick (Automated)  Result Value Ref Range   Color, UA yellow    Clarity, UA cloudy    Glucose, UA Negative Negative   Bilirubin, UA negative    Ketones, UA negative    Spec Grav, UA 1.010 1.010 - 1.025   Blood, UA negative    pH, UA 6.0 5.0 - 8.0   Protein, UA Negative Negative   Urobilinogen, UA 0.2 0.2 or 1.0 E.U./dL   Nitrite, UA negative    Leukocytes, UA Moderate (2+) (A) Negative   12/20- received urine culture Multidrug resistant E coli UTI ?did she take the fosfomycin Message to pt

## 2018-07-06 ENCOUNTER — Encounter: Payer: Self-pay | Admitting: Family Medicine

## 2018-07-06 ENCOUNTER — Telehealth: Payer: Self-pay

## 2018-07-06 ENCOUNTER — Ambulatory Visit (INDEPENDENT_AMBULATORY_CARE_PROVIDER_SITE_OTHER): Payer: Medicare Other | Admitting: Family Medicine

## 2018-07-06 VITALS — BP 140/80 | HR 106 | Resp 16 | Ht 63.5 in | Wt 165.0 lb

## 2018-07-06 DIAGNOSIS — D72819 Decreased white blood cell count, unspecified: Secondary | ICD-10-CM

## 2018-07-06 DIAGNOSIS — R300 Dysuria: Secondary | ICD-10-CM

## 2018-07-06 DIAGNOSIS — L03113 Cellulitis of right upper limb: Secondary | ICD-10-CM

## 2018-07-06 LAB — POC URINALSYSI DIPSTICK (AUTOMATED)
Bilirubin, UA: NEGATIVE
Blood, UA: NEGATIVE
Glucose, UA: NEGATIVE
Ketones, UA: NEGATIVE
Nitrite, UA: NEGATIVE
Protein, UA: NEGATIVE
SPEC GRAV UA: 1.01 (ref 1.010–1.025)
Urobilinogen, UA: 0.2 E.U./dL
pH, UA: 6 (ref 5.0–8.0)

## 2018-07-06 LAB — CBC WITH DIFFERENTIAL/PLATELET
Basophils Absolute: 0.1 10*3/uL (ref 0.0–0.1)
Basophils Relative: 1 % (ref 0.0–3.0)
Eosinophils Absolute: 0.1 10*3/uL (ref 0.0–0.7)
Eosinophils Relative: 1.4 % (ref 0.0–5.0)
HCT: 42.8 % (ref 36.0–46.0)
Hemoglobin: 14.2 g/dL (ref 12.0–15.0)
LYMPHS ABS: 1.5 10*3/uL (ref 0.7–4.0)
Lymphocytes Relative: 21.1 % (ref 12.0–46.0)
MCHC: 33.1 g/dL (ref 30.0–36.0)
MCV: 81.1 fl (ref 78.0–100.0)
MONO ABS: 0.6 10*3/uL (ref 0.1–1.0)
Monocytes Relative: 7.8 % (ref 3.0–12.0)
Neutro Abs: 5 10*3/uL (ref 1.4–7.7)
Neutrophils Relative %: 68.7 % (ref 43.0–77.0)
Platelets: 302 10*3/uL (ref 150.0–400.0)
RBC: 5.28 Mil/uL — ABNORMAL HIGH (ref 3.87–5.11)
RDW: 14.5 % (ref 11.5–15.5)
WBC: 7.2 10*3/uL (ref 4.0–10.5)

## 2018-07-06 MED ORDER — FOSFOMYCIN TROMETHAMINE 3 G PO PACK: 3.0000 g | PACK | Freq: Once | ORAL | 0 refills | Status: AC

## 2018-07-06 MED FILL — MONUROL 3 GM SACHET: 3 | 1 days supply | Qty: 1 | Fill #0

## 2018-07-06 NOTE — Telephone Encounter (Signed)
PA approved.

## 2018-07-06 NOTE — Telephone Encounter (Signed)
PA initiated via Covermymeds; KEY: [id number]. Awaiting determination.

## 2018-07-06 NOTE — Patient Instructions (Addendum)
I will be in touch with your urine culture and labs asap  We will try a course of fosfomycin for your urine infection if not too expensive

## 2018-07-08 ENCOUNTER — Encounter: Payer: Self-pay | Admitting: Family Medicine

## 2018-07-08 LAB — URINE CULTURE
MICRO NUMBER: 91514702
SPECIMEN QUALITY: ADEQUATE

## 2018-07-17 ENCOUNTER — Encounter: Payer: Self-pay | Admitting: Family Medicine

## 2018-07-17 DIAGNOSIS — N390 Urinary tract infection, site not specified: Secondary | ICD-10-CM

## 2018-07-19 ENCOUNTER — Other Ambulatory Visit: Payer: Medicare Other

## 2018-07-19 DIAGNOSIS — N390 Urinary tract infection, site not specified: Secondary | ICD-10-CM | POA: Diagnosis not present

## 2018-07-20 DIAGNOSIS — B029 Zoster without complications: Secondary | ICD-10-CM

## 2018-07-20 HISTORY — DX: Zoster without complications: B02.9

## 2018-07-20 LAB — URINE CULTURE
MICRO NUMBER:: 91556521
Result:: NO GROWTH
SPECIMEN QUALITY:: ADEQUATE

## 2018-07-21 ENCOUNTER — Encounter: Payer: Self-pay | Admitting: Family Medicine

## 2018-07-21 NOTE — Progress Notes (Signed)
Received her urine culture, it is negative Message to pt   Results for orders placed or performed in visit on 07/19/18  Urine Culture  Result Value Ref Range   MICRO NUMBER: [id number]    SPECIMEN QUALITY: Adequate    Sample Source URINE    STATUS: FINAL    Result: No Growth

## 2018-07-22 ENCOUNTER — Encounter: Payer: Self-pay | Admitting: General Practice

## 2018-07-22 NOTE — Progress Notes (Signed)
[place] Note  Met with Morgan [name] for >1h per her request. Per pt, she is working through depression which is at least in part related to dx/sx, vocational discernment, and her son [name]'s upcoming deployment. Provided pastoral presence, reflective listening, emotional support, theological reflection, and prayer. Morgan [name] verbalized relief at having released a lot of feelings related to her situation by speaking them aloud with an active listener. She knows to f/u with Spiritual Care as needed/desired for ongoing spiritual direction/reflection/support.   [place], [place], [facility] Pager [phone] Voicemail [phone]

## 2018-07-23 ENCOUNTER — Telehealth: Payer: Self-pay | Admitting: Hematology & Oncology

## 2018-07-23 NOTE — Telephone Encounter (Signed)
Mailed FMLA papers to pt's home

## 2018-07-25 ENCOUNTER — Encounter: Payer: Self-pay | Admitting: General Practice

## 2018-07-25 NOTE — Progress Notes (Signed)
[facility] Spiritual Care Note  Received and returned VM. Priseis verbalized gratitude for reading a text that is spiritually dear to her, as well as discouragement in her mood/faith at the moment. Encouraged callback.   [place], [place], [facility] Pager [phone] Voicemail [phone]

## 2018-07-27 ENCOUNTER — Ambulatory Visit (INDEPENDENT_AMBULATORY_CARE_PROVIDER_SITE_OTHER): Payer: Medicare Other | Admitting: Psychiatry

## 2018-07-27 ENCOUNTER — Encounter: Payer: Self-pay | Admitting: General Practice

## 2018-07-27 DIAGNOSIS — F4310 Post-traumatic stress disorder, unspecified: Secondary | ICD-10-CM

## 2018-07-27 DIAGNOSIS — F3173 Bipolar disorder, in partial remission, most recent episode manic: Secondary | ICD-10-CM

## 2018-07-27 NOTE — Progress Notes (Signed)
[place] Spiritual Care Note  Visited with Morgan [name] in the [place] per her request, providing pastoral listening and reflection as she shared and processed about recent theological struggles. She continues to utilize Spiritual Care well for spiritual direction and spiritual companionship as she wrestles with questions of meaning, purpose, and plans (such as her hope to relocate to [place], [place] to be nearer the beach and a community that would understand the challenges of a [employer] mom, as her son will soon be deployed to the [place]). She will reach out again as needed.   [place], [place], [facility] Pager [phone] Voicemail [phone]

## 2018-08-09 DIAGNOSIS — Z9049 Acquired absence of other specified parts of digestive tract: Secondary | ICD-10-CM | POA: Diagnosis not present

## 2018-08-09 DIAGNOSIS — R918 Other nonspecific abnormal finding of lung field: Secondary | ICD-10-CM | POA: Diagnosis not present

## 2018-08-09 DIAGNOSIS — R1013 Epigastric pain: Secondary | ICD-10-CM | POA: Diagnosis not present

## 2018-08-09 DIAGNOSIS — L539 Erythematous condition, unspecified: Secondary | ICD-10-CM | POA: Diagnosis not present

## 2018-08-09 DIAGNOSIS — R1011 Right upper quadrant pain: Secondary | ICD-10-CM | POA: Diagnosis not present

## 2018-08-09 DIAGNOSIS — Z87440 Personal history of urinary (tract) infections: Secondary | ICD-10-CM | POA: Diagnosis not present

## 2018-08-09 DIAGNOSIS — L299 Pruritus, unspecified: Secondary | ICD-10-CM | POA: Diagnosis not present

## 2018-08-09 DIAGNOSIS — M549 Dorsalgia, unspecified: Secondary | ICD-10-CM | POA: Diagnosis not present

## 2018-08-09 DIAGNOSIS — Z87891 Personal history of nicotine dependence: Secondary | ICD-10-CM | POA: Diagnosis not present

## 2018-08-09 DIAGNOSIS — Z856 Personal history of leukemia: Secondary | ICD-10-CM | POA: Diagnosis not present

## 2018-08-09 DIAGNOSIS — R591 Generalized enlarged lymph nodes: Secondary | ICD-10-CM | POA: Diagnosis not present

## 2018-08-10 ENCOUNTER — Encounter: Payer: Self-pay | Admitting: General Practice

## 2018-08-10 ENCOUNTER — Ambulatory Visit (INDEPENDENT_AMBULATORY_CARE_PROVIDER_SITE_OTHER): Payer: Medicare Other | Admitting: Psychiatry

## 2018-08-10 DIAGNOSIS — F4310 Post-traumatic stress disorder, unspecified: Secondary | ICD-10-CM | POA: Diagnosis not present

## 2018-08-10 DIAGNOSIS — F3173 Bipolar disorder, in partial remission, most recent episode manic: Secondary | ICD-10-CM | POA: Diagnosis not present

## 2018-08-10 NOTE — Progress Notes (Signed)
[place] Spiritual Care Note  Had visit from [facility] to debrief and reflect after her appt with Dr Ouida Sills today and Dr Dorothea Ogle at [facility] (and [facility] ED) yesterday. She is relieved to learn that her CLL currently does not need treatment and that her abdominal pain is due to an ulcer, rather than something more threatening.   Scripture reading and meditation/prayer are very important parts of Sonni's spiritual life. We talked at length about passages and images that are meaningful and engaging for her right now. Also provided empathic listening and emotional support. Trent will reach out again as desired.   [place], [place], [facility] Pager [phone] Voicemail [phone]

## 2018-08-11 ENCOUNTER — Ambulatory Visit (INDEPENDENT_AMBULATORY_CARE_PROVIDER_SITE_OTHER): Payer: Medicare Other | Admitting: Family Medicine

## 2018-08-11 ENCOUNTER — Encounter: Payer: Self-pay | Admitting: Family Medicine

## 2018-08-11 VITALS — BP 106/62 | HR 87 | Temp 97.9°F | Resp 18 | Ht 63.5 in | Wt 168.0 lb

## 2018-08-11 DIAGNOSIS — Z09 Encounter for follow-up examination after completed treatment for conditions other than malignant neoplasm: Secondary | ICD-10-CM | POA: Diagnosis not present

## 2018-08-11 DIAGNOSIS — C4490 Unspecified malignant neoplasm of skin, unspecified: Secondary | ICD-10-CM | POA: Diagnosis not present

## 2018-08-11 DIAGNOSIS — R109 Unspecified abdominal pain: Secondary | ICD-10-CM

## 2018-08-11 MED ORDER — VALACYCLOVIR HCL 1 G PO TABS: 1000.0000 mg | ORAL_TABLET | Freq: Three times a day (TID) | ORAL | 0 refills | Status: DC

## 2018-08-11 NOTE — Progress Notes (Signed)
Morgan [name] at [facility], [place], [place] [address] [phone]  Date:  08/11/2018   Name:  Morgan [name]   DOB:  May 05, 1966   MRN:  [id number]  PCP:  Morgan Mclean, MD    Chief Complaint: Follow-up (ED follow up, abdominal pain, possible shingles rash? )   History of Present Illness:  Morgan [name] is a 53 y.o. very pleasant female patient who presents with the following: History of CLL, not currently on treatment She was seen at the [facility] ER on 1/21 with abd pain;  Her discharge paperwork mentions a possible duodenal ulcer on her CT scan, however the CT report itself is negative and does not mention any such ulcer:  The stomach is unremarkable. There is a focal locule of gas near the medial aspect of the second portion of the duodenum and the common bile duct, likely gas within a redundant portion of duodenal mucosa/fold. No surrounding inflammatory change. The remainder of the small bowel and large bowel are normal in caliber and wall thickness. No focal perienteric inflammatory change. Normal appendix. No free fluid in the pelvis. The uterus and adnexal structures are surgically absent. The urinary bladder is unremarkable.  Enlarged left external iliac lymph nodes measuring 1.3 x 2.8 cm. Additional mildly prominent right external iliac lymph nodes. No mesenteric or retroperitoneal lymphadenopathy.  Unremarkable venous system. The abdominal aorta and proximal branch vessels are patent and normal in caliber.  No focal subcutaneous soft tissue abnormality. Mild degenerative changes at L5-S1. No acute or suspicious osseous findings.  Impression: 1. No etiology for abdominal pain identified. 2. Mild left greater than right external iliac lymphadenopathy, likely related to history of CLL.   Her labs from the emergency room are benign In any case, she was told not to take NSAIDs and was given protonix for  presumed ulcer  Morgan [name] also mentions that she has felt some itching and stinging along her right mid trunk, she wonders if this might actually be shingles.  She has not noticed any rash  Her most recent colon/ upper GI done 09/2015 per Dr. Ardis [name]- she had gastritis but not an ulcer at that time   She is trying to drink less coffee and is avoiding spicy or irritating foods She is not having vomiting or fever No blood in her stool She is able to eat okay  Patient Active Problem List   Diagnosis Date Noted   Fever 06/18/2018   Sore throat 06/18/2018   URI (upper respiratory infection) 06/18/2018   Dysfunction of right eustachian tube 12/03/2017   Piriformis syndrome of right side 04/22/2017   Lateral epicondylitis of left elbow 04/06/2017   Solitary pulmonary nodule 10/29/2016   Chronic lymphocytic leukemia ([place]) 07/31/2016   Mass in neck 04/17/2016   Abdominal mass 04/17/2016   Asthma 12/12/2015   Multiple environmental allergies 12/12/2015   Chronic lower back pain 09/12/2015   Bipolar 1 disorder, depressed ([place]) 08/27/2015    Class: Chronic   Other specified hypothyroidism 08/23/2015   Borderline personality disorder ([place]) 08/15/2015   Severe benzodiazepine use disorder ([place]) 08/15/2015   Alcohol use disorder, moderate, dependence ([place]) 08/15/2015   PTSD (post-traumatic stress disorder) 08/15/2015   History of bipolar disorder 08/15/2015   Pre-syncope 05/17/2015   Skull deformity 05/17/2015   Patellofemoral syndrome of both knees 05/02/2015    Past Medical History:  Diagnosis Date   Anxiety    Arthritis    Asthma  Bipolar disorder ([place])    Bulging lumbar disc 07/19/05   Chronic headaches    Chronic lymphocytic leukemia ([place]) 07/31/2016   Colon polyps    Degenerative disorder of bone    Depression    History of borderline personality disorder    Hypothyroidism 2007   Developed after use of Lithium   IBS (irritable bowel  syndrome) 1995   Pneumonia    Proctitis    Substance abuse ([place])    ativan last month    Past Surgical History:  Procedure Laterality Date   ABDOMINAL HYSTERECTOMY     APPENDECTOMY     BUNIONECTOMY Right 06/2012   CHOLECYSTECTOMY     OOPHORECTOMY     SHOULDER OPEN ROTATOR CUFF REPAIR Right 03/2010   TUBAL LIGATION      Social History   Tobacco Use   Smoking status: Former Smoker    Last attempt to quit: 07/20/1980    Years since quitting: 38.0   Smokeless tobacco: Never Used  Substance Use Topics   Alcohol use: No    Alcohol/week: 0.0 standard drinks    Comment: unsure   Drug use: Yes    Types: Benzodiazepines    Family History  Problem Relation Age of Onset   Depression Mother    Hypertension Mother    Post-traumatic stress disorder Sister    Alcohol abuse Brother    Drug abuse Brother    Post-traumatic stress disorder Brother    Thyroid disease Father    Alzheimer's disease Father    COPD Maternal Grandmother    Heart disease Maternal Grandfather    Arthritis Paternal Grandmother    Diabetes Paternal Grandmother    Depression Paternal Grandmother    Alzheimer's disease Paternal Grandmother    Heart disease Paternal Grandfather    Coronary artery disease Paternal Grandfather    Alcohol abuse Paternal Grandfather    Colon cancer Neg Hx    Breast cancer Neg Hx     Allergies  Allergen Reactions   Aripiprazole Anaphylaxis and Swelling   Ciprofloxacin Shortness Of Breath   Lamotrigine Rash and Other (See Comments)   Nitrofurantoin Hives   Other Anaphylaxis, Hives and Swelling    Walnuts and pine nuts   Peanut Oil Anaphylaxis, Hives, Swelling and Rash   Amoxicillin Hives and Rash    Has patient had a PCN reaction causing immediate rash, facial/tongue/throat swelling, SOB or lightheadedness with hypotension: Yes Has patient had a PCN reaction causing severe rash involving mucus membranes or skin necrosis: No Has  patient had a PCN reaction that required hospitalization: No Has patient had a PCN reaction occurring within the last 10 years: No If all of the above answers are "NO", then may proceed with Cephalosporin use.   Doxycycline Hives and Rash   Latex Rash and Other (See Comments)   Meloxicam Itching and Other (See Comments)    Induces mania Interacts with Lithium   Naproxen Hives and Other (See Comments)    Interacts with lithium   Pramipexole Hives and Rash   Prednisone Other (See Comments)    Interacts with Lithium   Risperidone Hives and Rash   Sulfa Antibiotics Rash    "that leaves scarring"   Tape Itching and Rash    Steri-Strips   Cortisone Other (See Comments)    Exacerbates the mania of her bipolar   Percocet [Oxycodone-Acetaminophen] Other (See Comments)    Severe dizziness    Medication list has been reviewed and updated.  Current Outpatient Medications on  File Prior to Visit  Medication Sig Dispense Refill   acetaminophen (TYLENOL) 500 MG tablet Take 1,000 mg by mouth every 6 (six) hours as needed for mild pain.     albuterol (PROVENTIL HFA;VENTOLIN HFA) 108 (90 Base) MCG/ACT inhaler Inhale 1-2 puffs into the lungs every 6 (six) hours as needed for wheezing or shortness of breath. 1 each 3   diphenhydrAMINE (BENADRYL ALLERGY) 25 mg capsule Take 25 mg by mouth every 6 (six) hours as needed.     EPINEPHrine 0.3 mg/0.3 mL IJ SOAJ injection Inject 0.3 mg into the muscle as needed (allergic reaction).      fluticasone furoate-vilanterol (BREO ELLIPTA) 100-25 MCG/INH AEPB Inhale 1 puff into the lungs daily. 1 each 5   hyoscyamine (LEVSIN, ANASPAZ) 0.125 MG tablet Take 1 tablet (0.125 mg total) by mouth as needed for cramping. Reported on 09/24/2015 10 tablet 2   Levothyroxine Sodium 25 MCG CAPS Take 1 capsule (25 mcg total) by mouth daily before breakfast. 30 capsule 3   pantoprazole (PROTONIX) 40 MG tablet Take by mouth.     rizatriptan (MAXALT-MLT) 10 MG  disintegrating tablet Take 1 tablet (10 mg total) by mouth as needed for migraine. May repeat in 2 hours if needed 9 tablet 11   topiramate (TOPAMAX) 50 MG tablet Take 1 tablet (50 mg total) by mouth 2 (two) times daily. 60 tablet 12   UNABLE TO FIND Take by mouth. ELDERBERRY FRUIT ORAL     No current facility-administered medications on file prior to visit.     Review of Systems:  As per HPI- otherwise negative. No fever or chills  Physical Examination: Vitals:   08/11/18 1329  BP: 106/62  Pulse: 87  Resp: 18  SpO2: 99%   Vitals:   08/11/18 1329  Weight: 168 lb (76.2 kg)  Height: 5' 3.5" (1.613 m)   Body mass index is 29.29 kg/m. Ideal Body Weight: Weight in (lb) to have BMI = 25: 143.1  GEN: WDWN, NAD, Non-toxic, A & O x 3, overweight, looks well.  As is typical for this patient, she is wearing examination type gloves and a special mask over her nose and mouth HEENT: Atraumatic, Normocephalic. Neck supple. No masses, No LAD. Ears and Nose: No external deformity. CV: RRR, No M/G/R. No JVD. No thrill. No extra heart sounds. PULM: CTA B, no wheezes, crackles, rhonchi. No retractions. No resp. distress. No accessory muscle use. ABD: S,  ND, +BS. No rebound. No HSM.  She does not seem to have tenderness in the abdominal cavity, but does note sensitivity to light touch of the skin over the T5, T6 dermatome.  Careful exam does not reveal any rash EXTR: No c/c/e NEURO Normal gait.  PSYCH: Normally interactive. Conversant. Not depressed or anxious appearing.  Calm demeanor.    Assessment and Plan: Side pain - Plan: valACYclovir (VALTREX) 1000 MG tablet  Skin cancer - Plan: Ambulatory referral to Dermatology  Here today to follow-up from an emergency room visit.  Per notes, the patient was told that her CT scan showed a duodenal ulcer.  However the CT report does not mention this finding; it does look like this report was amended.  Perhaps the mention of duodenal ulcer was  removed. It is not clear whether [place]'s symptoms are due to an ulcer, or if she might possibly have shingles.  She has no rash at this time.  Will add Valtrex as this should not be harmful.  I have asked her to keep  me closely updated as to her progress. We will send a message to her gastroenterologist and request they bring her in for a visit.  Also recommended that she continue her Protonix, avoid NSAIDs and spicy foods.  Placed referral to dermatology for skin cancer screening per patient request  Meds ordered this encounter  Medications   valACYclovir (VALTREX) 1000 MG tablet    Sig: Take 1 tablet (1,000 mg total) by mouth 3 (three) times daily.    Dispense:  21 tablet    Refill:  0     Signed Lamar Blinks, MD

## 2018-08-11 NOTE — Patient Instructions (Signed)
It is possible that your symptoms represent shingles, though since you do not have a rash we cannot tell for sure.  Let us treat you with Valtrex anyway, this may be helpful and should not hurt anything.  Take this 3 times a day for 1 week.  Please let me know if you notice any difference.  I will send a message to your gastroenterologist, Dr. Ardis Hughs, and ask them to bring you in for a visit.  In the meantime please continue your daily Protonix.  Avoid taking any NSAIDs such as ibuprofen or Aleve, and also avoid acidic or spicy foods.  If you care have any dramatic worsening of your symptoms, vomiting, or other concerns please seek care

## 2018-08-12 ENCOUNTER — Encounter: Payer: Self-pay | Admitting: General Practice

## 2018-08-12 ENCOUNTER — Telehealth: Payer: Self-pay

## 2018-08-12 NOTE — Progress Notes (Signed)
[place] Spiritual Care Note  Received and returned call from [facility] requesting listening and prayer support as she reflected theologically on her new shingles dx and the news that her son [name] will be traveling overseas earlier than expected on his deployment. Morgan [name] requests that prayer for her not ask that God take away her trials, but rather strengthen her through them. This is a key tenet of her theology of suffering and growth in faith.   [place], [place], [facility] Pager [phone] Voicemail [phone]

## 2018-08-12 NOTE — Telephone Encounter (Signed)
Pt is returning your call

## 2018-08-12 NOTE — Telephone Encounter (Signed)
Left message on machine to call back  

## 2018-08-12 NOTE — Telephone Encounter (Signed)
Appt has been made for 08/24/18 at 815 am with Ellouise Newer.  Pt is aware

## 2018-08-12 NOTE — Telephone Encounter (Signed)
-----   Message from Milus Banister, MD sent at 08/12/2018  7:18 AM EST ----- Thanks. We'll get in touch with her.  Veeda Virgo, Can you call her to offer ROV with me or extender in the next few  weeks.  Thanks  ----- Message ----- From: Darreld Mclean, MD Sent: 08/11/2018   8:56 PM EST To: Milus Banister, MD  Hi Dan,You may remember seeing this lady for upper and lower GI scope back in 2017.  She was recently in the [facility] ER with abdominal pain, and was told that she had a duodenal ulcer seen on CT.  Interestingly the CT report is actually negative for this finding, it may have been removed on addendum  Which your office failed to bring her in for recheck visit?  We are currently treating her with Protonix Thank you!  [place]

## 2018-08-23 ENCOUNTER — Ambulatory Visit: Payer: Self-pay | Admitting: *Deleted

## 2018-08-23 DIAGNOSIS — R109 Unspecified abdominal pain: Secondary | ICD-10-CM

## 2018-08-23 MED ORDER — HYDROCODONE-ACETAMINOPHEN 5-325 MG PO TABS: 1.0000 | ORAL_TABLET | Freq: Four times a day (QID) | ORAL | 0 refills | Status: AC | PRN

## 2018-08-23 MED ORDER — VALACYCLOVIR HCL 1 G PO TABS: 1000.0000 mg | ORAL_TABLET | Freq: Three times a day (TID) | ORAL | 0 refills | Status: DC

## 2018-08-23 NOTE — Addendum Note (Signed)
Addended by: Lamar Blinks C on: 08/23/2018 03:41 PM   Modules accepted: Orders

## 2018-08-23 NOTE — Telephone Encounter (Signed)
I saw her about 2 weeks ago- she had possible shingles at that time but no rash She reports that she broke out in a rash typical of shingles just after our last visit No narcotics on her PDMP  She notes that the rash is on her side/trunk, no involvement of her face or eye She requests something for pain She is able to tolerate hydrocodone Sent in an rx for this and also for another week of valtrex Oxycodone has caused dizziness but not true allergy   Cautioned about sedation She will see me if need be   Meds ordered this encounter  Medications  . valACYclovir (VALTREX) 1000 MG tablet    Sig: Take 1 tablet (1,000 mg total) by mouth 3 (three) times daily.    Dispense:  21 tablet    Refill:  0  . HYDROcodone-acetaminophen (NORCO/VICODIN) 5-325 MG tablet    Sig: Take 1 tablet by mouth every 6 (six) hours as needed for up to 5 days.    Dispense:  15 tablet    Refill:  0

## 2018-08-23 NOTE — Telephone Encounter (Signed)
Pt diagnosed with shingles 08/11/2018. Reports now blistering , very painful. Has tried tylenol and IBU, ineffective; "Does not touch the pain."  HAs taken Valtrex as prescribed, completed course. Requesting RX called in for pain relief. States was on Neurontin "For my Bipolar symptoms and it did not help my chronic pain." H/O chronic back pain. Discussed neuropathic pain. Please advise: [phone]  Reason for Disposition . Caller requesting a NON-URGENT new prescription or refill and triager unable to refill per unit policy  Answer Assessment - Initial Assessment Questions 1. SYMPTOMS: "Do you have any symptoms?"     Pain with shingles 2. SEVERITY: If symptoms are present, ask "Are they mild, moderate or severe?"     severe  Protocols used: MEDICATION QUESTION CALL-A-AH

## 2018-08-24 ENCOUNTER — Ambulatory Visit (INDEPENDENT_AMBULATORY_CARE_PROVIDER_SITE_OTHER): Payer: Medicare Other | Admitting: Psychiatry

## 2018-08-24 ENCOUNTER — Ambulatory Visit: Payer: Self-pay | Admitting: Physician Assistant

## 2018-08-24 DIAGNOSIS — F3173 Bipolar disorder, in partial remission, most recent episode manic: Secondary | ICD-10-CM | POA: Diagnosis not present

## 2018-08-24 DIAGNOSIS — F4310 Post-traumatic stress disorder, unspecified: Secondary | ICD-10-CM

## 2018-08-25 ENCOUNTER — Encounter: Payer: Self-pay | Admitting: General Practice

## 2018-08-25 DIAGNOSIS — D229 Melanocytic nevi, unspecified: Secondary | ICD-10-CM | POA: Diagnosis not present

## 2018-08-25 DIAGNOSIS — B029 Zoster without complications: Secondary | ICD-10-CM | POA: Diagnosis not present

## 2018-08-25 DIAGNOSIS — L578 Other skin changes due to chronic exposure to nonionizing radiation: Secondary | ICD-10-CM | POA: Diagnosis not present

## 2018-08-25 DIAGNOSIS — D225 Melanocytic nevi of trunk: Secondary | ICD-10-CM | POA: Diagnosis not present

## 2018-08-25 NOTE — Progress Notes (Signed)
[place] Spiritual Care Note  Had hourlong Spiritual Care phone session with Morgan [name] due to shingles. She is utilizing her faith to cope with and make meaning of the pain. This helps bolster her morale in the face of suffering while nurturing spiritual growth in herself. In particular we reflected today on the practice of praying without ceasing and how doing one's own internal work can transform one's interactions with others.   [place], [place], [facility] Pager [phone] Voicemail [phone]

## 2018-08-28 ENCOUNTER — Encounter: Payer: Self-pay | Admitting: Family Medicine

## 2018-08-31 ENCOUNTER — Ambulatory Visit: Payer: Self-pay | Admitting: *Deleted

## 2018-08-31 MED ORDER — LORAZEPAM 0.5 MG PO TABS: 0.5000 mg | ORAL_TABLET | Freq: Three times a day (TID) | ORAL | 0 refills | Status: DC

## 2018-08-31 NOTE — Addendum Note (Signed)
Addended by: Lamar Blinks C on: 08/31/2018 02:52 PM   Modules accepted: Orders

## 2018-08-31 NOTE — Telephone Encounter (Signed)
Patient calling with increased level of anxiety over the last few days. She began with the shingles breakout around January 23-24 under her right breast and towards her back. She was prescribed valtrex and then prescribed famvir by dermatology. Managing the pain with ibuprofen and excedrin. Stated the hyrocodone gave her headaches. She has numbness under the breast that feels like it's moving to her back. Denies any fever. Discussed postherpetic neuralgia with some advice care per protocol. Suggested an appointment with PCP to talk about her level of anxiety with this issue. Appointment made for 09/05/18. Reassured That what she is experiencing is normal and recommended she reach out to her support system at this time. Call back with further concerns and/or fever.   Reason for Disposition . Pain persisting > 1 month after rash disappears  Answer Assessment - Initial Assessment Questions 1. APPEARANCE of RASH: "Describe the rash."      Having post shingles neuropathy  2. LOCATION: "Where is the rash located?"      Under right breast and back. 3. ONSET: "When did the rash start?"      Breakout was mid January. Numbness recently noticed over the last week.  4. ITCHING: "Does the rash itch?" If so, ask: "How bad is the itch?"  (Scale 1-10; or mild, moderate, severe)     Somewhat painful but the numbness is the worst 5. PAIN: "Does the rash hurt?" If so, ask: "How bad is the pain?"  (Scale 1-10; or mild, moderate, severe)     mild 6. OTHER SYMPTOMS: "Do you have any other symptoms?" (e.g., fever)     no 7. PREGNANCY: "Is there any chance you are pregnant?" "When was your last menstrual period?"     no  Protocols used: [facility]

## 2018-08-31 NOTE — Telephone Encounter (Signed)
Called the pharmacy and clarified that her ativan is prn

## 2018-09-02 ENCOUNTER — Ambulatory Visit: Payer: Self-pay | Admitting: Hematology & Oncology

## 2018-09-02 ENCOUNTER — Encounter: Payer: Self-pay | Admitting: General Practice

## 2018-09-02 ENCOUNTER — Other Ambulatory Visit: Payer: Self-pay

## 2018-09-02 NOTE — Progress Notes (Signed)
[place] Spiritual Care Note  Received call from [facility] to check in, sharing her joy and relief at beginning to feel improvement in her shingles and processing communication in key relationships related to [employer] skills. She appears to be in good spirits overall, finding meaning/purpose in preparing for a big event and work, and looking forward to several things, including time with her daughter and an upcoming trip.   Provided empathic listening, pastoral reflection, and encouragement. Morgan [name] plans to call again when she is ready for more spiritual reflection.   [place], [place], [facility] Pager [phone] Voicemail [phone]

## 2018-09-04 NOTE — Progress Notes (Signed)
[facility], [place], [place] [address] [phone]  Date:  09/05/2018   Name:  Morgan [name]   DOB:  November 15, 1965   MRN:  [id number]  PCP:  Darreld Mclean, MD    Chief Complaint: Herpes Zoster (post shingles pain, pain on skin abdominal to back area) and Anxiety   History of Present Illness:  Morgan [name] is a 53 y.o. very pleasant female patient who presents with the following:  Morgan [name] is here today with concern of shingles causing pain She has history of CLL, not currently on treatment I saw her on the 23rd, at which point she had side pain and we suspected shingles although she did not currently have a rash, we started her on valtrex.   She then broke out in a rash in the following few days, typical of shingles She called me, and I did give her a second week of Valtrex per her request She has been treated with valtrex for 2 weeks, then she saw another doctor and was given a course of famciclovir on 2/6 She feels the she is overall getting better, yesterday she felt pretty good and walk for exercise. Her pain is a bit worse again today, but she still hopes that she is over the worst of it  She has used gabapentin in the past but it did not work well with her-she did not want to try this again, as it seemed to exacerbate some of her psychiatric symptoms Ativan is helping her- she is taking this prior to sleep and it helps her  She is not having as much trouble with panic now that her pain is better   She is traveling this upcoming weekend, will be visiting some friends and looking for a home in a new location. These friends do have a 52-month-old child. I have advised her that the shingles should not be very contagious as long as she is covered by clothing, and we presume the child has had her first varicella immunization.  However she will discuss her condition with the friend she plans to stay  with.   Patient Active Problem List   Diagnosis Date Noted   Fever 06/18/2018   Sore throat 06/18/2018   URI (upper respiratory infection) 06/18/2018   Dysfunction of right eustachian tube 12/03/2017   Piriformis syndrome of right side 04/22/2017   Lateral epicondylitis of left elbow 04/06/2017   Solitary pulmonary nodule 10/29/2016   Chronic lymphocytic leukemia ([place]) 07/31/2016   Mass in neck 04/17/2016   Abdominal mass 04/17/2016   Asthma 12/12/2015   Multiple environmental allergies 12/12/2015   Chronic lower back pain 09/12/2015   Bipolar 1 disorder, depressed ([place]) 08/27/2015    Class: Chronic   Other specified hypothyroidism 08/23/2015   Borderline personality disorder ([place]) 08/15/2015   Severe benzodiazepine use disorder ([place]) 08/15/2015   Alcohol use disorder, moderate, dependence ([place]) 08/15/2015   PTSD (post-traumatic stress disorder) 08/15/2015   History of bipolar disorder 08/15/2015   Pre-syncope 05/17/2015   Skull deformity 05/17/2015   Patellofemoral syndrome of both knees 05/02/2015    Past Medical History:  Diagnosis Date   Anxiety    Arthritis    Asthma    Bipolar disorder ([place])    Bulging lumbar disc 07/19/05   Chronic headaches    Chronic lymphocytic leukemia ([place]) 07/31/2016   Colon polyps    Degenerative disorder of bone    Depression  History of borderline personality disorder    Hypothyroidism 2007   Developed after use of Lithium   IBS (irritable bowel syndrome) 1995   Pneumonia    Proctitis    Substance abuse ([place])    ativan last month    Past Surgical History:  Procedure Laterality Date   ABDOMINAL HYSTERECTOMY     APPENDECTOMY     BUNIONECTOMY Right 06/2012   CHOLECYSTECTOMY     OOPHORECTOMY     SHOULDER OPEN ROTATOR CUFF REPAIR Right 03/2010   TUBAL LIGATION      Social History   Tobacco Use   Smoking status: Former Smoker    Last attempt to quit: 07/20/1980    Years since  quitting: 38.1   Smokeless tobacco: Never Used  Substance Use Topics   Alcohol use: No    Alcohol/week: 0.0 standard drinks    Comment: unsure   Drug use: Yes    Types: Benzodiazepines    Family History  Problem Relation Age of Onset   Depression Mother    Hypertension Mother    Post-traumatic stress disorder Sister    Alcohol abuse Brother    Drug abuse Brother    Post-traumatic stress disorder Brother    Thyroid disease Father    Alzheimer's disease Father    COPD Maternal Grandmother    Heart disease Maternal Grandfather    Arthritis Paternal Grandmother    Diabetes Paternal Grandmother    Depression Paternal Grandmother    Alzheimer's disease Paternal Grandmother    Heart disease Paternal Grandfather    Coronary artery disease Paternal Grandfather    Alcohol abuse Paternal Grandfather    Colon cancer Neg Hx    Breast cancer Neg Hx     Allergies  Allergen Reactions   Aripiprazole Anaphylaxis and Swelling   Ciprofloxacin Shortness Of Breath   Lamotrigine Rash and Other (See Comments)   Nitrofurantoin Hives   Other Anaphylaxis, Hives and Swelling    Walnuts and pine nuts   Peanut Oil Anaphylaxis, Hives, Swelling and Rash   Amoxicillin Hives and Rash    Has patient had a PCN reaction causing immediate rash, facial/tongue/throat swelling, SOB or lightheadedness with hypotension: Yes Has patient had a PCN reaction causing severe rash involving mucus membranes or skin necrosis: No Has patient had a PCN reaction that required hospitalization: No Has patient had a PCN reaction occurring within the last 10 years: No If all of the above answers are "NO", then may proceed with Cephalosporin use.   Doxycycline Hives and Rash   Latex Rash and Other (See Comments)   Meloxicam Itching and Other (See Comments)    Induces mania Interacts with Lithium   Naproxen Hives and Other (See Comments)    Interacts with lithium   Pramipexole Hives and  Rash   Prednisone Other (See Comments)    Interacts with Lithium   Risperidone Hives and Rash   Sulfa Antibiotics Rash    "that leaves scarring"   Tape Itching and Rash    Steri-Strips   Cortisone Other (See Comments)    Exacerbates the mania of her bipolar   Percocet [Oxycodone-Acetaminophen] Other (See Comments)    Severe dizziness    Medication list has been reviewed and updated.  Current Outpatient Medications on File Prior to Visit  Medication Sig Dispense Refill   acetaminophen (TYLENOL) 500 MG tablet Take 1,000 mg by mouth every 6 (six) hours as needed for mild pain.     albuterol (PROVENTIL HFA;VENTOLIN HFA) 108 (90  Base) MCG/ACT inhaler Inhale 1-2 puffs into the lungs every 6 (six) hours as needed for wheezing or shortness of breath. 1 each 3   diphenhydrAMINE (BENADRYL ALLERGY) 25 mg capsule Take 25 mg by mouth every 6 (six) hours as needed.     EPINEPHrine 0.3 mg/0.3 mL IJ SOAJ injection Inject 0.3 mg into the muscle as needed (allergic reaction).      fluticasone furoate-vilanterol (BREO ELLIPTA) 100-25 MCG/INH AEPB Inhale 1 puff into the lungs daily. 1 each 5   hyoscyamine (LEVSIN, ANASPAZ) 0.125 MG tablet Take 1 tablet (0.125 mg total) by mouth as needed for cramping. Reported on 09/24/2015 10 tablet 2   Levothyroxine Sodium 25 MCG CAPS Take 1 capsule (25 mcg total) by mouth daily before breakfast. 30 capsule 3   LORazepam (ATIVAN) 0.5 MG tablet Take 1 tablet (0.5 mg total) by mouth every 8 (eight) hours. 20 tablet 0   pantoprazole (PROTONIX) 40 MG tablet Take by mouth.     rizatriptan (MAXALT-MLT) 10 MG disintegrating tablet Take 1 tablet (10 mg total) by mouth as needed for migraine. May repeat in 2 hours if needed 9 tablet 11   topiramate (TOPAMAX) 50 MG tablet Take 1 tablet (50 mg total) by mouth 2 (two) times daily. 60 tablet 12   UNABLE TO FIND Take by mouth. ELDERBERRY FRUIT ORAL     valACYclovir (VALTREX) 1000 MG tablet Take 1 tablet (1,000 mg  total) by mouth 3 (three) times daily. 21 tablet 0   No current facility-administered medications on file prior to visit.     Review of Systems:  As per HPI- otherwise negative.  No fever or chills Mentally she does feel as though she is in a better place Physical Examination: Vitals:   09/05/18 1606  BP: 110/78  Pulse: 73  Resp: 16  Temp: 97.8 F (36.6 C)  SpO2: 100%   Vitals:   09/05/18 1606  Weight: 170 lb (77.1 kg)  Height: 5' 3.5" (1.613 m)   Body mass index is 29.64 kg/m. Ideal Body Weight: Weight in (lb) to have BMI = 25: 143.1  GEN: WDWN, NAD, Non-toxic, A & O x 3, overweight, otherwise looks generally well.  She is wearing a surgical mask and gloves as is her habit HEENT: Atraumatic, Normocephalic. Neck supple. No masses, No LAD. Ears and Nose: No external deformity. CV: RRR, No M/G/R. No JVD. No thrill. No extra heart sounds. PULM: CTA B, no wheezes, crackles, rhonchi. No retractions. No resp. distress. No accessory muscle use. ABD: S, NT, ND, +BS. No rebound. No HSM. She has a very classic shingles rash on her right side just under the breast.  It runs from her anterior midline through to her back. This does appear to be in the healing stages EXTR: No c/c/e NEURO Normal gait.  PSYCH: Normally interactive. Conversant. Not depressed or anxious appearing.  Calm demeanor.    Assessment and Plan: Herpes zoster without complication  Here today to discuss recent shingles. I saw her on January 23, at which point she had side pain but no rash.  We suspected that she might have shingles and started on Valtrex, and indeed she then broke out in a classic varicella-zoster rash She has since taken 2 weeks of Valtrex and 1 week of famciclovir I advised her that further antivirals are unlikely to be helpful  Patient is hesitant to take medications such as gabapentin or Lyrica due to previous adverse reaction.  She does feel that she is through the  worst of the pain now.   For the time being she will continue to treat with over-the-counter medications as she is currently doing. she may use Ativan as needed for sleep and panic  We plan to give her the Shingrix vaccine once her rash is healed  She will let me know if I can do anything else to help  Signed Lamar Blinks, MD

## 2018-09-05 ENCOUNTER — Encounter: Payer: Self-pay | Admitting: Family Medicine

## 2018-09-05 ENCOUNTER — Ambulatory Visit (INDEPENDENT_AMBULATORY_CARE_PROVIDER_SITE_OTHER): Payer: Medicare Other | Admitting: Family Medicine

## 2018-09-05 VITALS — BP 110/78 | HR 73 | Temp 97.8°F | Resp 16 | Ht 63.5 in | Wt 170.0 lb

## 2018-09-05 DIAGNOSIS — B029 Zoster without complications: Secondary | ICD-10-CM

## 2018-09-05 NOTE — Patient Instructions (Addendum)
Your shingles does seem to be getting better- continue to use the ativan as needed at bedtime

## 2018-09-07 ENCOUNTER — Ambulatory Visit: Payer: Medicare Other | Admitting: Psychiatry

## 2018-09-08 ENCOUNTER — Encounter: Payer: Self-pay | Admitting: General Practice

## 2018-09-08 NOTE — Progress Notes (Signed)
[place] Spiritual Care Note  Utilized reflective listening and open-ended questions via phone to assist [name] with discernment per her request. Morgan [name] verbalized that fear has been interfering with weekend and life plans that she made well in advance; talking through incentives, barriers, contingencies, and prayer themes helped her sift through her feelings and become more comfortable with a path forward. She plans to call again when needed.   [place], [place], [facility] Pager [phone] Voicemail [phone]

## 2018-09-15 ENCOUNTER — Ambulatory Visit (INDEPENDENT_AMBULATORY_CARE_PROVIDER_SITE_OTHER): Payer: Medicare Other | Admitting: Psychiatry

## 2018-09-15 ENCOUNTER — Encounter: Payer: Self-pay | Admitting: General Practice

## 2018-09-15 DIAGNOSIS — F3173 Bipolar disorder, in partial remission, most recent episode manic: Secondary | ICD-10-CM

## 2018-09-15 DIAGNOSIS — F4310 Post-traumatic stress disorder, unspecified: Secondary | ICD-10-CM | POA: Diagnosis not present

## 2018-09-15 NOTE — Progress Notes (Signed)
[place] Note  Had 12m visit as scheduled for [place] to debrief about [place] and to support her in processing a personal conflict. [place] well as a container for her spiritual reflection and prayer.   [place], [place], [facility] Pager [phone] Voicemail [phone]

## 2018-09-16 ENCOUNTER — Encounter: Payer: Self-pay | Admitting: Family Medicine

## 2018-09-20 ENCOUNTER — Encounter: Payer: Self-pay | Admitting: Gastroenterology

## 2018-09-20 ENCOUNTER — Ambulatory Visit: Payer: Medicare Other | Admitting: Gastroenterology

## 2018-09-20 VITALS — BP 106/72 | HR 72 | Ht 63.5 in | Wt 172.0 lb

## 2018-09-20 DIAGNOSIS — B029 Zoster without complications: Secondary | ICD-10-CM

## 2018-09-20 NOTE — Patient Instructions (Addendum)
Restart your protonix 40mg , take one pill 20-30 min before your breakfast meal daily.  Start taking 2 extra strength twice daily.  Take NSAIDs as needed if tylenol is not helping enough.  You did not have an ulcer when you were at the [facility] ER, it is not clear why you were told that you did.  Follow up PRN.

## 2018-09-20 NOTE — Progress Notes (Signed)
HPI: This is a very pleasant 53 year old woman whom I last saw 2 years ago for colonoscopy and upper endoscopy is here for a new issue today.  She was told that she had an ulcer while she was at the [facility] emergency room a month and a half ago.  Chief complaint is she was at [facility] seeing her hematologist for her leukemia and she had been having epigastric right upper quadrant right flank to back pain for several days.  The pain was quite excruciating and she was sent to the emergency room for further evaluation.  She underwent blood tests as well as imaging.  See those summarized below.  She was told that she had a duodenal ulcer.  Several days after that she broke out in a very classic appearing shingles rash in the distribution of her pain.  Since then she has been taking Excedrin, ibuprofen for the shingles pain.  Not currently taking PPI.  She has started to have some epigastric burning over the past few weeks.  No postprandial discomforts her weight has been stable.   Old Data Reviewed:   She presented to [facility] emergency room January 2020 with abdominal pains.  CBC and complete metabolic profile normal.  She underwent a CT scan of the abdomen with IV and oral contrast.  It was essentially unrevealing however the text does describe that "There is a focal locule of gas near the medial  aspect of the second portion of the duodenum and the common bile duct, likely gas within a redundant portion of duodenal mucosa/fold."  Colonoscopy April 2013, Dr. Trilby Drummer in [facility], indications history of ulcerative proctitis, anal or rectal pain. He noted a 4 mm polyp in the transverse colon that was removed. This was proven to be a sessile serrated adenoma on pathology. He noted scarring in the rectum compatible with an active ulcerative proctitis. Otherwise normal mucosa throughout the colon. Biopsies were taken randomly. He noted internal hemorrhoids. Random biopsies were  normal.  Colonoscopy 09/2015 Dr. Ardis Hughs for recent fecal occult positive stool was normal.  She was recommended to have repeat colonoscopy for colon cancer screening at 10-year interval  EGD, Dr. Ardis Hughs March 2017 for postprandial abdominal pain, intermittent dysphasia found mild nonspecific distal gastritis, the examination was otherwise normal.  Biopsies were negative for H. Pylori.   Review of systems: Pertinent positive and negative review of systems were noted in the above HPI section. All other review negative.   Past Medical History:  Diagnosis Date  . Anxiety   . Arthritis   . Asthma   . Bipolar disorder ([place])   . Bulging lumbar disc 07/19/05  . Chronic headaches   . Chronic lymphocytic leukemia ([place]) 07/31/2016  . Colon polyps   . Degenerative disorder of bone   . Depression   . History of borderline personality disorder   . Hypothyroidism 2007   Developed after use of Lithium  . IBS (irritable bowel syndrome) 1995  . Pneumonia   . Proctitis   . Shingles 07/2018  . Substance abuse ([place])    ativan last month    Past Surgical History:  Procedure Laterality Date  . ABDOMINAL HYSTERECTOMY    . APPENDECTOMY    . BUNIONECTOMY Right 06/2012  . CHOLECYSTECTOMY    . OOPHORECTOMY    . SHOULDER OPEN ROTATOR CUFF REPAIR Right 03/2010  . TUBAL LIGATION      Current Outpatient Medications  Medication Sig Dispense Refill  . acetaminophen (TYLENOL) 500  MG tablet Take 1,000 mg by mouth every 6 (six) hours as needed for mild pain.    Marland Kitchen albuterol (PROVENTIL HFA;VENTOLIN HFA) 108 (90 Base) MCG/ACT inhaler Inhale 1-2 puffs into the lungs every 6 (six) hours as needed for wheezing or shortness of breath. 1 each 3  . diphenhydrAMINE (BENADRYL ALLERGY) 25 mg capsule Take 25 mg by mouth every 6 (six) hours as needed.    Marland Kitchen EPINEPHrine 0.3 mg/0.3 mL IJ SOAJ injection Inject 0.3 mg into the muscle as needed (allergic reaction).     . fluticasone furoate-vilanterol (BREO ELLIPTA) 100-25  MCG/INH AEPB Inhale 1 puff into the lungs daily. 1 each 5  . hyoscyamine (LEVSIN, ANASPAZ) 0.125 MG tablet Take 1 tablet (0.125 mg total) by mouth as needed for cramping. Reported on 09/24/2015 10 tablet 2  . Levothyroxine Sodium 25 MCG CAPS Take 1 capsule (25 mcg total) by mouth daily before breakfast. 30 capsule 3  . LORazepam (ATIVAN) 0.5 MG tablet Take 1 tablet (0.5 mg total) by mouth every 8 (eight) hours. 20 tablet 0  . rizatriptan (MAXALT-MLT) 10 MG disintegrating tablet Take 1 tablet (10 mg total) by mouth as needed for migraine. May repeat in 2 hours if needed 9 tablet 11  . UNABLE TO FIND Take by mouth. ELDERBERRY FRUIT ORAL    . pantoprazole (PROTONIX) 40 MG tablet Take by mouth.    . topiramate (TOPAMAX) 50 MG tablet Take 1 tablet (50 mg total) by mouth 2 (two) times daily. (Patient not taking: Reported on 09/20/2018) 60 tablet 12   No current facility-administered medications for this visit.     Allergies as of 09/20/2018 - Review Complete 09/20/2018  Allergen Reaction Noted  . Aripiprazole Anaphylaxis and Swelling 11/23/2012  . Ciprofloxacin Shortness Of Breath 01/10/2015  . Lamotrigine Rash and Other (See Comments) 11/23/2012  . Nitrofurantoin Hives 04/08/2018  . Other Anaphylaxis, Hives, and Swelling 11/23/2012  . Peanut oil Anaphylaxis, Hives, Swelling, and Rash 08/15/2015  . Amoxicillin Hives and Rash 11/23/2012  . Doxycycline Hives and Rash 11/23/2012  . Latex Rash and Other (See Comments) 11/23/2012  . Meloxicam Itching and Other (See Comments) 11/23/2012  . Naproxen Hives and Other (See Comments) 11/23/2012  . Pramipexole Hives and Rash 11/23/2012  . Prednisone Other (See Comments) 11/23/2012  . Risperidone Hives and Rash 11/23/2012  . Sulfa antibiotics Rash 11/23/2012  . Tape Itching and Rash 03/08/2015  . Cortisone Other (See Comments) 03/05/2015  . Percocet [oxycodone-acetaminophen] Other (See Comments) 06/19/2015    Family History  Problem Relation Age of Onset   . Depression Mother   . Hypertension Mother   . Post-traumatic stress disorder Sister   . Alcohol abuse Brother   . Drug abuse Brother   . Post-traumatic stress disorder Brother   . Thyroid disease Father   . Alzheimer's disease Father   . COPD Maternal Grandmother   . Heart disease Maternal Grandfather   . Arthritis Paternal Grandmother   . Diabetes Paternal Grandmother   . Depression Paternal Grandmother   . Alzheimer's disease Paternal Grandmother   . Heart disease Paternal Grandfather   . Coronary artery disease Paternal Grandfather   . Alcohol abuse Paternal Grandfather   . Colon cancer Neg Hx   . Breast cancer Neg Hx     Social History   Socioeconomic History  . Marital status: Divorced    Spouse name: Not on file  . Number of children: 2  . Years of education: 41  . Highest education level: Not  on file  Occupational History  . Occupation: [employer]  Social Needs  . Financial resource strain: Not on file  . Food insecurity:    Worry: Not on file    Inability: Not on file  . Transportation needs:    Medical: Not on file    Non-medical: Not on file  Tobacco Use  . Smoking status: Former Smoker    Last attempt to quit: 07/20/1980    Years since quitting: 38.1  . Smokeless tobacco: Never Used  Substance and Sexual Activity  . Alcohol use: No    Alcohol/week: 0.0 standard drinks    Comment: unsure  . Drug use: Yes    Types: Benzodiazepines  . Sexual activity: Never    Birth control/protection: Surgical    Comment: intercourse age unknown,sexual partners less than 5  Lifestyle  . Physical activity:    Days per week: Not on file    Minutes per session: Not on file  . Stress: Not on file  Relationships  . Social connections:    Talks on phone: Not on file    Gets together: Not on file    Attends religious service: Not on file    Active member of club or organization: Not on file    Attends meetings of clubs or organizations: Not on file     Relationship status: Not on file  . Intimate partner violence:    Fear of current or ex partner: Not on file    Emotionally abused: Not on file    Physically abused: Not on file    Forced sexual activity: Not on file  Other Topics Concern  . Not on file  Social History Narrative   Fun: [place], travel, music, writing, walking, hiking, volunteering   Denies religious beliefs effecting health care.    Feels safe at home.    Divorced, Works at [employer].  Children 2.  Lives home alone.     Physical Exam: BP 106/72   Pulse 72   Ht 5' 3.5" (1.613 m)   Wt 172 lb (78 kg)   BMI 29.99 kg/m  Constitutional: generally well-appearing Psychiatric: alert and oriented x3 Eyes: extraocular movements intact Mouth: oral pharynx moist, no lesions Neck: supple no lymphadenopathy Cardiovascular: heart regular rate and rhythm Lungs: clear to auscultation bilaterally Abdomen: soft, nontender, nondistended, no obvious ascites, no peritoneal signs, normal bowel sounds Extremities: no lower extremity edema bilaterally Skin: no lesions on visible extremities   Assessment and plan: 53 y.o. female with shingles, pain associated with shingles  I explained to her that she did not have an ulcer in her duodenum.  I was able to review the [place] radiology report from her CAT scan 6 weeks ago and there was never any mention of ulcer in her duodenum in the report.  I am certain that the pains that she was having that brought her to the emergency room were related to her shingles which eventually became very apparent 3 or 4 days after the emergency room visit.  She is still very bothered by the shingles pain and she has been taking a lot of NSAIDs for it.  She has not been taking her usual proton pump inhibitor and I explained to her that she certainly should be taking her proton pump inhibitor since she is needing so many NSAIDs daily.  I also recommended that she try taking Tylenol 2 extra strength twice  daily before grabbing for an NSAID just to decrease  the chances of gastric irritation, peptic ulcer disease related to the NSAIDs.  I see no reason for any further blood tests or imaging studies at this point, she will call if she has any further questions or concerns.    Please see the "Patient Instructions" section for addition details about the plan.   Owens Loffler, MD [facility] 09/20/2018, 3:52 PM  Cc: Darreld Mclean, MD

## 2018-09-21 ENCOUNTER — Ambulatory Visit (INDEPENDENT_AMBULATORY_CARE_PROVIDER_SITE_OTHER): Payer: Medicare Other | Admitting: Psychiatry

## 2018-09-21 DIAGNOSIS — F3173 Bipolar disorder, in partial remission, most recent episode manic: Secondary | ICD-10-CM

## 2018-09-21 DIAGNOSIS — F4310 Post-traumatic stress disorder, unspecified: Secondary | ICD-10-CM

## 2018-09-27 NOTE — Progress Notes (Signed)
[facility] [address], [address] [place], [place] [address] [phone]  Date:  09/28/2018   Name:  Morgan [name]   DOB:  Jun 30, 1966   MRN:  [id number]  PCP:  Pearline Cables, MD    Chief Complaint: Herpes Zoster (another rash appeared under right breast, feels like a burn)   History of Present Illness:  Morgan [name] is a 53 y.o. very pleasant female patient who presents with the following:  Patient with history of CLL and shingles Last week she saw her gastroenterologist about possible ulcer seen on CT scan-however it looks like her CT was actually negative for any ulcer which is good news.  From her last visit with me on 2/17: I saw her on the 23rd, at which point she had side pain and we suspected shingles although she did not currently have a rash, we started her on valtrex.   She then broke out in a rash in the following few days, typical of shingles She called me, and I did give her a second week of Valtrex per her request She has been treated with valtrex for 2 weeks, then she saw another doctor and was given a course of famciclovir on 2/6 She feels the she is overall getting better, yesterday she felt pretty good and walk for exercise. Her pain is a bit worse again today, but she still hopes that she is over the worst of it  She recently struggled with shingles, seemed to finally get better but about 5 days ago she got a rash on her LEFT side of her body (had been on the right side) - just under the left breast.  She applied some "monkey butt" powder, and it seems to have gotten better  She has noticed some cervical nodes over the last couple of weeks- she is seeing oncology on 3/25 and will mention to him  No fever Her shingles rash is still healing over her right trunk She is still getting out to walk for exercise as much as she can She plans to get a mammogram as soon as her rash allows, and also will get the  shingles vaccine when she is healed  No fever, except of the shingles she has actually felt pretty good She has noted some left-sided sinus congestion just the last couple of days, wonders if it may develop into a sinus infection  Patient Active Problem List   Diagnosis Date Noted  . Fever 06/18/2018  . Sore throat 06/18/2018  . URI (upper respiratory infection) 06/18/2018  . Dysfunction of right eustachian tube 12/03/2017  . Piriformis syndrome of right side 04/22/2017  . Lateral epicondylitis of left elbow 04/06/2017  . Solitary pulmonary nodule 10/29/2016  . Chronic lymphocytic leukemia (HCC) 07/31/2016  . Mass in neck 04/17/2016  . Abdominal mass 04/17/2016  . Asthma 12/12/2015  . Multiple environmental allergies 12/12/2015  . Chronic lower back pain 09/12/2015  . Bipolar 1 disorder, depressed (HCC) 08/27/2015    Class: Chronic  . Other specified hypothyroidism 08/23/2015  . Borderline personality disorder (HCC) 08/15/2015  . Severe benzodiazepine use disorder (HCC) 08/15/2015  . Alcohol use disorder, moderate, dependence (HCC) 08/15/2015  . PTSD (post-traumatic stress disorder) 08/15/2015  . History of bipolar disorder 08/15/2015  . Pre-syncope 05/17/2015  . Skull deformity 05/17/2015  . Patellofemoral syndrome of both knees 05/02/2015    Past Medical History:  Diagnosis Date  . Anxiety   . Arthritis   .  Asthma   . Bipolar disorder (HCC)   . Bulging lumbar disc 07/19/05  . Chronic headaches   . Chronic lymphocytic leukemia (HCC) 07/31/2016  . Colon polyps   . Degenerative disorder of bone   . Depression   . History of borderline personality disorder   . Hypothyroidism 2007   Developed after use of Lithium  . IBS (irritable bowel syndrome) 1995  . Pneumonia   . Proctitis   . Shingles 07/2018  . Substance abuse (HCC)    ativan last month    Past Surgical History:  Procedure Laterality Date  . ABDOMINAL HYSTERECTOMY    . APPENDECTOMY    . BUNIONECTOMY  Right 06/2012  . CHOLECYSTECTOMY    . OOPHORECTOMY    . SHOULDER OPEN ROTATOR CUFF REPAIR Right 03/2010  . TUBAL LIGATION      Social History   Tobacco Use  . Smoking status: Former Smoker    Last attempt to quit: 07/20/1980    Years since quitting: 38.2  . Smokeless tobacco: Never Used  Substance Use Topics  . Alcohol use: No    Alcohol/week: 0.0 standard drinks    Comment: unsure  . Drug use: Yes    Types: Benzodiazepines    Family History  Problem Relation Age of Onset  . Depression Mother   . Hypertension Mother   . Post-traumatic stress disorder Sister   . Alcohol abuse Brother   . Drug abuse Brother   . Post-traumatic stress disorder Brother   . Thyroid disease Father   . Alzheimer's disease Father   . COPD Maternal Grandmother   . Heart disease Maternal Grandfather   . Arthritis Paternal Grandmother   . Diabetes Paternal Grandmother   . Depression Paternal Grandmother   . Alzheimer's disease Paternal Grandmother   . Heart disease Paternal Grandfather   . Coronary artery disease Paternal Grandfather   . Alcohol abuse Paternal Grandfather   . Colon cancer Neg Hx   . Breast cancer Neg Hx     Allergies  Allergen Reactions  . Aripiprazole Anaphylaxis and Swelling  . Ciprofloxacin Shortness Of Breath  . Lamotrigine Rash and Other (See Comments)  . Nitrofurantoin Hives  . Other Anaphylaxis, Hives and Swelling    Walnuts and pine nuts  . Peanut Oil Anaphylaxis, Hives, Swelling and Rash  . Amoxicillin Hives and Rash    Has patient had a PCN reaction causing immediate rash, facial/tongue/throat swelling, SOB or lightheadedness with hypotension: Yes Has patient had a PCN reaction causing severe rash involving mucus membranes or skin necrosis: No Has patient had a PCN reaction that required hospitalization: No Has patient had a PCN reaction occurring within the last 10 years: No If all of the above answers are "NO", then may proceed with Cephalosporin use.  Marland Kitchen  Doxycycline Hives and Rash  . Latex Rash and Other (See Comments)  . Meloxicam Itching and Other (See Comments)    Induces mania Interacts with Lithium  . Naproxen Hives and Other (See Comments)    Interacts with lithium  . Pramipexole Hives and Rash  . Prednisone Other (See Comments)    Interacts with Lithium  . Risperidone Hives and Rash  . Sulfa Antibiotics Rash    "that leaves scarring"  . Tape Itching and Rash    Steri-StripsT  . Cortisone Other (See Comments)    Exacerbates the mania of her bipolar  . Percocet [Oxycodone-Acetaminophen] Other (See Comments)    Severe dizziness    Medication list has been  reviewed and updated.  Current Outpatient Medications on File Prior to Visit  Medication Sig Dispense Refill  . acetaminophen (TYLENOL) 500 MG tablet Take 1,000 mg by mouth every 6 (six) hours as needed for mild pain.    Marland Kitchen albuterol (PROVENTIL HFA;VENTOLIN HFA) 108 (90 Base) MCG/ACT inhaler Inhale 1-2 puffs into the lungs every 6 (six) hours as needed for wheezing or shortness of breath. 1 each 3  . diphenhydrAMINE (BENADRYL ALLERGY) 25 mg capsule Take 25 mg by mouth every 6 (six) hours as needed.    Marland Kitchen EPINEPHrine 0.3 mg/0.3 mL IJ SOAJ injection Inject 0.3 mg into the muscle as needed (allergic reaction).     . fluticasone furoate-vilanterol (BREO ELLIPTA) 100-25 MCG/INH AEPB Inhale 1 puff into the lungs daily. 1 each 5  . hyoscyamine (LEVSIN, ANASPAZ) 0.125 MG tablet Take 1 tablet (0.125 mg total) by mouth as needed for cramping. Reported on 09/24/2015 10 tablet 2  . Levothyroxine Sodium 25 MCG CAPS Take 1 capsule (25 mcg total) by mouth daily before breakfast. 30 capsule 3  . LORazepam (ATIVAN) 0.5 MG tablet Take 1 tablet (0.5 mg total) by mouth every 8 (eight) hours. 20 tablet 0  . rizatriptan (MAXALT-MLT) 10 MG disintegrating tablet Take 1 tablet (10 mg total) by mouth as needed for migraine. May repeat in 2 hours if needed 9 tablet 11  . topiramate (TOPAMAX) 50 MG tablet  Take 1 tablet (50 mg total) by mouth 2 (two) times daily. 60 tablet 12  . UNABLE TO FIND Take by mouth. ELDERBERRY FRUIT ORAL    . pantoprazole (PROTONIX) 40 MG tablet Take by mouth.     No current facility-administered medications on file prior to visit.     Review of Systems:  As per HPI- otherwise negative.   Physical Examination: Vitals:   09/28/18 1607  BP: 124/78  Pulse: 78  Resp: 16  Temp: 98 F (36.7 C)  SpO2: 98%   Vitals:   09/28/18 1607  Weight: 170 lb (77.1 kg)  Height: 5' 3.5" (1.613 m)   Body mass index is 29.64 kg/m. Ideal Body Weight: Weight in (lb) to have BMI = 25: 143.1  GEN: WDWN, NAD, Non-toxic, A & O x 3, overweight, looks well HEENT: Atraumatic, Normocephalic. Neck supple. No masses, No LAD.  She does point out two generous nodes, one on each side of her neck.  However I would not say these are definitely pathologic Ears and Nose: No external deformity. CV: RRR, No M/G/R. No JVD. No thrill. No extra heart sounds. PULM: CTA B, no wheezes, crackles, rhonchi. No retractions. No resp. distress. No accessory muscle use. ABD: S, NT, ND EXTR: No c/c/e NEURO Normal gait.  PSYCH: Normally interactive. Conversant. Not depressed or anxious appearing.  Calm demeanor.  Resolving typical shingles rash on her right trunk, under the right breast and around her right back. The left side of her body shows no suspicious rash or skin finding   Assessment and Plan: Herpes zoster without complication  Screening for breast cancer - Plan: MM 3D SCREEN BREAST BILATERAL  CLL (chronic lymphocytic leukemia) (HCC)  Ordered mammogram for her to have done once her rash is healed Discussed her shingles, I suspect she had a mild yeast infection or other irritation of the left breast, at this point I do not see any evidence of shingles on that side.  Offered reassurance, she will keep me posted She is seeing oncology soon, will point out the lymph nodes in her neck  at that  time She will send me a mychart message if her sinus sx develop-in that case I can call in an antibiotic for her She will let me know if any concerns in the meantime Signed Abbe Amsterdam, MD

## 2018-09-28 ENCOUNTER — Other Ambulatory Visit: Payer: Self-pay

## 2018-09-28 ENCOUNTER — Ambulatory Visit (INDEPENDENT_AMBULATORY_CARE_PROVIDER_SITE_OTHER): Payer: Medicare Other | Admitting: Family Medicine

## 2018-09-28 ENCOUNTER — Ambulatory Visit: Payer: Medicare Other | Admitting: Diagnostic Neuroimaging

## 2018-09-28 ENCOUNTER — Encounter: Payer: Self-pay | Admitting: Family Medicine

## 2018-09-28 VITALS — BP 124/78 | HR 78 | Temp 98.0°F | Resp 16 | Ht 63.5 in | Wt 170.0 lb

## 2018-09-28 DIAGNOSIS — C9110 Chronic lymphocytic leukemia of B-cell type not having achieved remission: Secondary | ICD-10-CM | POA: Diagnosis not present

## 2018-09-28 DIAGNOSIS — B029 Zoster without complications: Secondary | ICD-10-CM

## 2018-09-28 DIAGNOSIS — Z1239 Encounter for other screening for malignant neoplasm of breast: Secondary | ICD-10-CM

## 2018-09-28 NOTE — Patient Instructions (Signed)
Please set up a mammogram once your shingles rash is healed I would also recommend that you have the shingles vaccine at that time Be sure to point out the lymph nodes in your neck at your next oncology visit  If you feel like you do have a sinus infection as time goes on, write to me and I will send in an antibiotic for you  Take care!

## 2018-10-10 ENCOUNTER — Encounter: Payer: Self-pay | Admitting: General Practice

## 2018-10-10 NOTE — Progress Notes (Signed)
[place] Spiritual Care Note  Received call from [facility] seeking emotional support and spiritual tools for coping with COVID-19 distress. Provided empathic listening, emotional support, questions for discernment, grounding tools. She closed conversation feeling more centered and plans to phone again as needed/desired for f/u support.   [place], [place], [facility] Pager [phone] Voicemail [phone]

## 2018-10-12 ENCOUNTER — Inpatient Hospital Stay (HOSPITAL_BASED_OUTPATIENT_CLINIC_OR_DEPARTMENT_OTHER): Payer: Medicare Other | Admitting: Hematology & Oncology

## 2018-10-12 ENCOUNTER — Other Ambulatory Visit: Payer: Self-pay

## 2018-10-12 ENCOUNTER — Inpatient Hospital Stay: Payer: Medicare Other | Attending: Hematology & Oncology

## 2018-10-12 ENCOUNTER — Encounter: Payer: Self-pay | Admitting: Hematology & Oncology

## 2018-10-12 ENCOUNTER — Telehealth: Payer: Self-pay | Admitting: Hematology & Oncology

## 2018-10-12 VITALS — BP 124/81 | HR 82 | Temp 98.8°F | Resp 20 | Ht 63.5 in | Wt 171.8 lb

## 2018-10-12 DIAGNOSIS — C9110 Chronic lymphocytic leukemia of B-cell type not having achieved remission: Secondary | ICD-10-CM | POA: Insufficient documentation

## 2018-10-12 DIAGNOSIS — B0229 Other postherpetic nervous system involvement: Secondary | ICD-10-CM | POA: Insufficient documentation

## 2018-10-12 LAB — CMP (CANCER CENTER ONLY)
ALT: 20 U/L (ref 0–44)
AST: 22 U/L (ref 15–41)
Albumin: 4.7 g/dL (ref 3.5–5.0)
Alkaline Phosphatase: 72 U/L (ref 38–126)
Anion gap: 10 (ref 5–15)
BUN: 12 mg/dL (ref 6–20)
CALCIUM: 10.1 mg/dL (ref 8.9–10.3)
CO2: 27 mmol/L (ref 22–32)
CREATININE: 0.87 mg/dL (ref 0.44–1.00)
Chloride: 105 mmol/L (ref 98–111)
GFR, Est AFR Am: >60 mL/min (ref >60)
GFR, Estimated: >60 mL/min (ref >60)
Glucose, Bld: 87 mg/dL (ref 70–99)
Potassium: 4.3 mmol/L (ref 3.5–5.1)
Sodium: 142 mmol/L (ref 135–145)
Total Bilirubin: 0.4 mg/dL (ref 0.3–1.2)
Total Protein: 6.8 g/dL (ref 6.5–8.1)

## 2018-10-12 LAB — CBC WITH DIFFERENTIAL (CANCER CENTER ONLY)
Abs Immature Granulocytes: 0.02 10*3/uL (ref 0.00–0.07)
Basophils Absolute: 0.1 10*3/uL (ref 0.0–0.1)
Basophils Relative: 2 %
Eosinophils Absolute: 0.4 10*3/uL (ref 0.0–0.5)
Eosinophils Relative: 7 %
HCT: 46.3 % — ABNORMAL HIGH (ref 36.0–46.0)
HEMOGLOBIN: 14.8 g/dL (ref 12.0–15.0)
Immature Granulocytes: 0 %
Lymphocytes Relative: 43 %
Lymphs Abs: 2.8 10*3/uL (ref 0.7–4.0)
MCH: 27.7 pg (ref 26.0–34.0)
MCHC: 32 g/dL (ref 30.0–36.0)
MCV: 86.7 fL (ref 80.0–100.0)
Monocytes Absolute: 0.7 10*3/uL (ref 0.1–1.0)
Monocytes Relative: 12 %
Neutro Abs: 2.3 10*3/uL (ref 1.7–7.7)
Neutrophils Relative %: 36 %
Platelet Count: 276 10*3/uL (ref 150–400)
RBC: 5.34 MIL/uL — AB (ref 3.87–5.11)
RDW: 13.7 % (ref 11.5–15.5)
WBC Count: 6.3 10*3/uL (ref 4.0–10.5)
nRBC: 0 % (ref 0.0–0.2)

## 2018-10-12 LAB — SAVE SMEAR(SSMR), FOR PROVIDER SLIDE REVIEW

## 2018-10-12 LAB — LACTATE DEHYDROGENASE: LDH: 197 U/L — ABNORMAL HIGH (ref 98–192)

## 2018-10-12 MED ORDER — FAMCICLOVIR 500 MG PO TABS: 500.0000 mg | ORAL_TABLET | Freq: Every day | ORAL | 3 refills | Status: DC

## 2018-10-12 NOTE — Progress Notes (Signed)
Hematology and Oncology Follow Up Visit  Morgan [name] [id number] [birth year] 53 y.o. 10/12/2018   Principle Diagnosis:  CLL  -- unmutated IgVH  Current Therapy:   Rituxan/Imbruvica - s/p cycle 3 -- pt. Declined any further therapy   Interim History: Morgan [name] is here today for follow-up.  She is in a little bit of a "bind.".  She is trying to move to [place], [place].  Her job will transfer to [employer].  However, with the corona virus, everything is up in the air right now.  She did have a bout of shingles.  This was back in January.  It seems to be in the right T7 dermatome.  There is still some postherpetic neuralgia.  I really think she needs to be on chronic immunosuppressive therapy with Famvir.  I talked to her about this.  She understands about this.  She is agreeable to a Famvir at 500 mg p.o. daily.  She did see Dr. Magnus Ivan at [facility] back in January.  Dr. Magnus Ivan recommended just observation right now.  Her last CT scan which was done in November did show some lymph node progression.  She clinically has lymph node progression she says.  She feels more lymph nodes in the axilla, inguinal area, and neck.  She is not sure she wants any treatment right now.  She has had allergy issues.  It can be bad for this year because the pollen is going to be quite intense.  She is eating okay.  She is having no problems with bowels or bladder.  She is gained weight because she is not been able to exercise.  Currently, I would say performance status is ECOG 1.   Medications:  Allergies as of 10/12/2018      Reactions   Aripiprazole Anaphylaxis, Swelling   Ciprofloxacin Shortness Of Breath   Lamotrigine Rash, Other (See Comments)   Nitrofurantoin Hives   Other Anaphylaxis, Hives, Swelling   Walnuts and pine nuts   Peanut Oil Anaphylaxis, Hives, Swelling, Rash   Amoxicillin Hives, Rash   Has patient had a PCN reaction causing immediate rash, facial/tongue/throat  swelling, SOB or lightheadedness with hypotension: Yes Has patient had a PCN reaction causing severe rash involving mucus membranes or skin necrosis: No Has patient had a PCN reaction that required hospitalization: No Has patient had a PCN reaction occurring within the last 10 years: No If all of the above answers are "NO", then may proceed with Cephalosporin use.   Doxycycline Hives, Rash   Latex Rash, Other (See Comments)   Meloxicam Itching, Other (See Comments)   Induces mania Interacts with Lithium   Naproxen Hives, Other (See Comments)   Interacts with lithium   Pramipexole Hives, Rash   Prednisone Other (See Comments)   Interacts with Lithium   Risperidone Hives, Rash   Sulfa Antibiotics Rash   "that leaves scarring"   Tape Itching, Rash   Steri-StripsT   Cortisone Other (See Comments)   Exacerbates the mania of her bipolar   Percocet [oxycodone-acetaminophen] Other (See Comments)   Severe dizziness      Medication List       Accurate as of October 12, 2018  9:17 AM. Always use your most recent med list.        acetaminophen 500 MG tablet Commonly known as:  TYLENOL Take 1,000 mg by mouth every 6 (six) hours as needed for mild pain.   albuterol 108 (90 Base) MCG/ACT inhaler Commonly known as:  PROVENTIL HFA;VENTOLIN HFA  Inhale 1-2 puffs into the lungs every 6 (six) hours as needed for wheezing or shortness of breath.   Benadryl Allergy 25 mg capsule Generic drug:  diphenhydrAMINE Take 25 mg by mouth every 6 (six) hours as needed.   EPINEPHrine 0.3 mg/0.3 mL Soaj injection Commonly known as:  EPI-PEN Inject 0.3 mg into the muscle as needed (allergic reaction).   fexofenadine 180 MG tablet Commonly known as:  ALLEGRA Take 180 mg by mouth at bedtime.   fluticasone furoate-vilanterol 100-25 MCG/INH Aepb Commonly known as:  BREO ELLIPTA Inhale 1 puff into the lungs daily.   hyoscyamine 0.125 MG tablet Commonly known as:  LEVSIN, ANASPAZ Take 1 tablet (0.125  mg total) by mouth as needed for cramping. Reported on 09/24/2015   Levothyroxine Sodium 25 MCG Caps Take 1 capsule (25 mcg total) by mouth daily before breakfast.   LORazepam 0.5 MG tablet Commonly known as:  ATIVAN Take 1 tablet (0.5 mg total) by mouth every 8 (eight) hours.   pantoprazole 40 MG tablet Commonly known as:  PROTONIX Take 40 mg by mouth daily.   pantoprazole 40 MG tablet Commonly known as:  PROTONIX Take by mouth.   rizatriptan 10 MG disintegrating tablet Commonly known as:  MAXALT-MLT Take 1 tablet (10 mg total) by mouth as needed for migraine. May repeat in 2 hours if needed   topiramate 50 MG tablet Commonly known as:  TOPAMAX Take 1 tablet (50 mg total) by mouth 2 (two) times daily.   UNABLE TO FIND Take by mouth. ELDERBERRY FRUIT ORAL       Allergies:  Allergies  Allergen Reactions  . Aripiprazole Anaphylaxis and Swelling  . Ciprofloxacin Shortness Of Breath  . Lamotrigine Rash and Other (See Comments)  . Nitrofurantoin Hives  . Other Anaphylaxis, Hives and Swelling    Walnuts and pine nuts  . Peanut Oil Anaphylaxis, Hives, Swelling and Rash  . Amoxicillin Hives and Rash    Has patient had a PCN reaction causing immediate rash, facial/tongue/throat swelling, SOB or lightheadedness with hypotension: Yes Has patient had a PCN reaction causing severe rash involving mucus membranes or skin necrosis: No Has patient had a PCN reaction that required hospitalization: No Has patient had a PCN reaction occurring within the last 10 years: No If all of the above answers are "NO", then may proceed with Cephalosporin use.  Marland Kitchen Doxycycline Hives and Rash  . Latex Rash and Other (See Comments)  . Meloxicam Itching and Other (See Comments)    Induces mania Interacts with Lithium  . Naproxen Hives and Other (See Comments)    Interacts with lithium  . Pramipexole Hives and Rash  . Prednisone Other (See Comments)    Interacts with Lithium  . Risperidone Hives and  Rash  . Sulfa Antibiotics Rash    "that leaves scarring"  . Tape Itching and Rash    Steri-StripsT  . Cortisone Other (See Comments)    Exacerbates the mania of her bipolar  . Percocet [Oxycodone-Acetaminophen] Other (See Comments)    Severe dizziness    Past Medical History, Surgical history, Social history, and Family History were reviewed and updated.  Review of Systems: Review of Systems  Constitutional: Negative.   HENT: Negative.   Eyes: Negative.   Respiratory: Negative.   Cardiovascular: Negative.   Gastrointestinal: Negative.   Genitourinary: Negative.   Musculoskeletal: Negative.   Skin: Negative.   Neurological: Negative.   Endo/Heme/Allergies: Negative.   Psychiatric/Behavioral: Negative.       Physical Exam:  height is 5' 3.5" (1.613 m) and weight is 171 lb 12.8 oz (77.9 kg). Her oral temperature is 98.8 F (37.1 C). Her blood pressure is 124/81 and her pulse is 82. Her respiration is 20 and oxygen saturation is 100%.   Wt Readings from Last 3 Encounters:  10/12/18 171 lb 12.8 oz (77.9 kg)  09/28/18 170 lb (77.1 kg)  09/20/18 172 lb (78 kg)    Physical Exam Vitals signs reviewed.  HENT:     Head: Normocephalic and atraumatic.  Eyes:     Pupils: Pupils are equal, round, and reactive to light.  Neck:     Musculoskeletal: Normal range of motion.     Comments: There may be a slight fullness in the left posterior region of her neck.  I cannot palpate any obvious adenopathy in either the cervical or supraclavicular regions bilaterally. Cardiovascular:     Rate and Rhythm: Normal rate and regular rhythm.     Heart sounds: Normal heart sounds.  Pulmonary:     Effort: Pulmonary effort is normal.     Breath sounds: Normal breath sounds.  Abdominal:     General: Bowel sounds are normal.     Palpations: Abdomen is soft.     Comments: I cannot palpate any adenopathy in the inguinal region bilaterally.  There is no hepatosplenomegaly.  Musculoskeletal: Normal  range of motion.        General: No tenderness or deformity.  Lymphadenopathy:     Cervical: No cervical adenopathy.  Skin:    General: Skin is warm and dry.     Findings: No erythema or rash.  Neurological:     Mental Status: She is alert and oriented to person, place, and time.  Psychiatric:        Behavior: Behavior normal.        Thought Content: Thought content normal.        Judgment: Judgment normal.      Lab Results  Component Value Date   WBC 6.3 10/12/2018   HGB 14.8 10/12/2018   HCT 46.3 (H) 10/12/2018   MCV 86.7 10/12/2018   PLT 276 10/12/2018   No results found for: FERRITIN, IRON, TIBC, UIBC, IRONPCTSAT Lab Results  Component Value Date   RBC 5.34 (H) 10/12/2018   Lab Results  Component Value Date   KPAFRELGTCHN 16.2 05/27/2018   LAMBDASER 8.5 05/27/2018   KAPLAMBRATIO 1.91 (H) 05/27/2018   Lab Results  Component Value Date   IGGSERUM 620 (L) 05/27/2018   IGA 29 (L) 05/27/2018   IGMSERUM 17 (L) 05/27/2018   Lab Results  Component Value Date   TOTALPROTELP 6.7 05/27/2018   ALBUMINELP 4.0 05/27/2018   A1GS 0.2 05/27/2018   A2GS 0.9 05/27/2018   BETS 1.0 05/27/2018   GAMS 0.5 05/27/2018   MSPIKE Not Observed 05/27/2018   SPEI Comment 05/27/2018     Chemistry      Component Value Date/Time   NA 142 10/12/2018 0832   NA 142 03/10/2017 0954   NA 142 09/04/2016 0754   K 4.3 10/12/2018 0832   K 4.2 03/10/2017 0954   K 4.0 09/04/2016 0754   CL 105 10/12/2018 0832   CL 111 (H) 03/10/2017 0954   CO2 27 10/12/2018 0832   CO2 28 03/10/2017 0954   CO2 25 09/04/2016 0754   BUN 12 10/12/2018 0832   BUN 11 03/10/2017 0954   BUN 13.4 09/04/2016 0754   CREATININE 0.87 10/12/2018 0832   CREATININE 1.1 03/10/2017 0954  CREATININE 0.8 09/04/2016 0754      Component Value Date/Time   CALCIUM 10.1 10/12/2018 0832   CALCIUM 9.6 03/10/2017 0954   CALCIUM 9.7 09/04/2016 0754   ALKPHOS 72 10/12/2018 0832   ALKPHOS 46 03/10/2017 0954   ALKPHOS 54  09/04/2016 0754   AST 22 10/12/2018 0832   AST 20 09/04/2016 0754   ALT 20 10/12/2018 0832   ALT 20 03/10/2017 0954   ALT 14 09/04/2016 0754   BILITOT 0.4 10/12/2018 0832   BILITOT 0.39 09/04/2016 0754      Impression and Plan: Morgan [name] is a very pleasant 53 yo caucasian female with CLL - unmutated IgVH.  Again, we are focusing on her quality of life.  I think that it is clear that she has disease progression.  I can feel more lymph nodes.  She just is not ready for treatment right now.  I think if she was to take treatment, I would probably give her a acalabrutinib.  I think she would be agreeable to this.  We will have to see what happens with her move.  If she does move to [place], then we will like to see back in her into the [facility].   I will set her up to come back here in 3 months if she is still in town.  We will have her come back sooner if she starts to have more problems with lymphadenopathy.  I also talked her about the possibility of IVIG infusions.  I think that the shingles is another indicator that her immune system is compromised.  I spent about 40 minutes with her today.  As always, we talked about a lot of topics.  She actually is quite a bit of fun to talk to.  She has a lot of experience and is very eloquent.  Josph Macho, MD 3/25/20209:17 AM

## 2018-10-12 NOTE — Telephone Encounter (Signed)
sched appts per 3/25 LOS. appt letter mailed

## 2018-10-13 ENCOUNTER — Encounter: Payer: Self-pay | Admitting: Family Medicine

## 2018-10-13 NOTE — Telephone Encounter (Signed)
Schedule webex °

## 2018-10-14 ENCOUNTER — Encounter: Payer: Self-pay | Admitting: General Practice

## 2018-10-14 NOTE — Progress Notes (Signed)
[place] Spiritual Care Note  Received and returned phone call for spiritual and emotional support. Morgan [name] utilizes Clinical biochemist well for sharing and processing personal discernment, evolving identity as someone who is living with cancer, new understandings of her disease, spiritual growth, and self-care interventions. We plan to check in again next week per her request, in part due to heightened anxiety re COVID-19.   [place], [place], [facility] Pager [phone] Voicemail [phone]

## 2018-10-19 ENCOUNTER — Encounter: Payer: Self-pay | Admitting: Family Medicine

## 2018-10-19 ENCOUNTER — Ambulatory Visit: Payer: Medicare Other | Admitting: Psychiatry

## 2018-10-27 ENCOUNTER — Encounter: Payer: Self-pay | Admitting: General Practice

## 2018-10-27 NOTE — Progress Notes (Signed)
[place] Spiritual Care Note  Returned [employer] with a phone call. She is struggling with social isolation and working to reconnect herself with tools that she knows to be effective in coping. To that end, we set up phone appts Monday and Thursday at 2pm for spiritual support and emotional/social contact.   [place], [place], [facility] Pager [phone] Voicemail [phone]

## 2018-10-28 ENCOUNTER — Ambulatory Visit (HOSPITAL_BASED_OUTPATIENT_CLINIC_OR_DEPARTMENT_OTHER): Payer: Self-pay

## 2018-11-02 ENCOUNTER — Ambulatory Visit: Payer: Medicare Other | Admitting: Psychiatry

## 2018-11-03 ENCOUNTER — Encounter: Payer: Self-pay | Admitting: General Practice

## 2018-11-03 ENCOUNTER — Encounter: Payer: Self-pay | Admitting: Family Medicine

## 2018-11-03 ENCOUNTER — Encounter: Payer: Self-pay | Admitting: Hematology & Oncology

## 2018-11-04 NOTE — Progress Notes (Signed)
[name]  Morgan [name] and I had to reschedule phone appt from Monday, so we had a lengthy call this afternoon. As usual, she utilized opportunity well to process discernment, theological reflections related to her health situation, and other things on her mind and heart. Provided empathic listening, pastoral reflection, and spiritual/social/emotional support. We plan to speak by phone again next week.   [place], [place], [facility] Pager [phone] Voicemail [phone]

## 2018-11-06 NOTE — Progress Notes (Signed)
[facility] [address] [place], [place] [address] [phone]  Date:  11/07/2018   Name:  Morgan [name]   DOB:  Sep 12, 1965   MRN:  [id number]  PCP:  Pearline Cables, MD    Chief Complaint: No chief complaint on file.   History of Present Illness:  Morgan [name] is a 53 y.o. very pleasant female patient who presents with the following:  Virtual visit today due to COVID pandemic Patient location is home Provider location is office PT ID confirmed with name and DOB, she gives consent for virtual visit today   [name] has history of CLL, anxiety/bipolar disorder She saw Dr. Myna Hidalgo to discuss her CLL just about 1 month ago-he recommended chronic immunosuppressive therapy with Famvir for recent shingles outbreak She has been on famvir for almost a month now- however she feels like her shingles is getting worse  Last week she noted increased pain again from her shingles area It persists in the same area as before, on the right side of her body along the bra line and even onto the breast.   The shingles is not as bad as it was, but it does disturb her sleep and impedes her ability to exercise She will use brenadryl for sleep and this does help  She was out of ativan   She was noted to have some progression of her leukemia, but preferred observation for the time being.  She has plans to move to [facility] soon and will need to find an oncologist there   I had seen her also in March, prior to her visit with Dr. Myna Hidalgo.  At that time she was concerned about a possible rash on the left side of her body (shingles had been on the right).  However this rash did not appear to be shingles, it looked more like a yeast infection of the breast area  She could use more ativan- will refill for her  08/31/2018  1   08/31/2018  Lorazepam 0.5 MG Tablet  20.00 6 Je Cop   [id number]   [facility] ([id number])   0  1.67 LME  Comm Ins   [place]  08/23/2018  1    08/23/2018  Hydrocodone-Acetamin 5-325 MG  15.00 3 Je Cop   [id number]   [facility] ([id number])   0  25.00 MME  Comm Ins   [place]  05/28/2018  1   05/23/2018  Alprazolam 0.5 MG Tablet  3.00 1 Vi Pen   [id number]   [facility] ([id number])   0  3.00 LME     After discussion of shingles, she also notes that she is having "a really hard time breathing."  She has noted this for 3 weeks approx.  It is difficult to get a clear history  She thinks that this is due to allergies- her sx are worse when she is exposed to the outdoors but present also when indoors  She does have asthma and allergies  No wheezing noted  No runny nose  She has her Breo inhaler and albuterol which she is using  This seems worse than asthma that she has had in the past   No chest pain No fever  She also has lymph nodes swollen in her neck - this is due to CLL- she wonders if these could impede her breathing.  Advised that unless extremely enlarged I don't think this would happen  At last visit in the office Dr.  Ennever noted some lymphadenopathy in her neck but did not comment that it was extreme so as to impede breathing   Patient Active Problem List   Diagnosis Date Noted  . Fever 06/18/2018  . Sore throat 06/18/2018  . URI (upper respiratory infection) 06/18/2018  . Dysfunction of right eustachian tube 12/03/2017  . Piriformis syndrome of right side 04/22/2017  . Lateral epicondylitis of left elbow 04/06/2017  . Solitary pulmonary nodule 10/29/2016  . Chronic lymphocytic leukemia (HCC) 07/31/2016  . Mass in neck 04/17/2016  . Abdominal mass 04/17/2016  . Asthma 12/12/2015  . Multiple environmental allergies 12/12/2015  . Chronic lower back pain 09/12/2015  . Bipolar 1 disorder, depressed (HCC) 08/27/2015    Class: Chronic  . Other specified hypothyroidism 08/23/2015  . Borderline personality disorder (HCC) 08/15/2015  . Severe benzodiazepine use disorder (HCC) 08/15/2015  . Alcohol use disorder, moderate, dependence (HCC) 08/15/2015  . PTSD  (post-traumatic stress disorder) 08/15/2015  . History of bipolar disorder 08/15/2015  . Pre-syncope 05/17/2015  . Skull deformity 05/17/2015  . Patellofemoral syndrome of both knees 05/02/2015    Past Medical History:  Diagnosis Date  . Anxiety   . Arthritis   . Asthma   . Bipolar disorder (HCC)   . Bulging lumbar disc 07/19/05  . Chronic headaches   . Chronic lymphocytic leukemia (HCC) 07/31/2016  . Colon polyps   . Degenerative disorder of bone   . Depression   . History of borderline personality disorder   . Hypothyroidism 2007   Developed after use of Lithium  . IBS (irritable bowel syndrome) 1995  . Pneumonia   . Proctitis   . Shingles 07/2018  . Substance abuse (HCC)    ativan last month    Past Surgical History:  Procedure Laterality Date  . ABDOMINAL HYSTERECTOMY    . APPENDECTOMY    . BUNIONECTOMY Right 06/2012  . CHOLECYSTECTOMY    . OOPHORECTOMY    . SHOULDER OPEN ROTATOR CUFF REPAIR Right 03/2010  . TUBAL LIGATION      Social History   Tobacco Use  . Smoking status: Former Smoker    Last attempt to quit: 07/20/1980    Years since quitting: 38.3  . Smokeless tobacco: Never Used  Substance Use Topics  . Alcohol use: No    Alcohol/week: 0.0 standard drinks    Comment: unsure  . Drug use: Yes    Types: Benzodiazepines    Family History  Problem Relation Age of Onset  . Depression Mother   . Hypertension Mother   . Post-traumatic stress disorder Sister   . Alcohol abuse Brother   . Drug abuse Brother   . Post-traumatic stress disorder Brother   . Thyroid disease Father   . Alzheimer's disease Father   . COPD Maternal Grandmother   . Heart disease Maternal Grandfather   . Arthritis Paternal Grandmother   . Diabetes Paternal Grandmother   . Depression Paternal Grandmother   . Alzheimer's disease Paternal Grandmother   . Heart disease Paternal Grandfather   . Coronary artery disease Paternal Grandfather   . Alcohol abuse Paternal Grandfather    . Colon cancer Neg Hx   . Breast cancer Neg Hx     Allergies  Allergen Reactions  . Aripiprazole Anaphylaxis and Swelling  . Ciprofloxacin Shortness Of Breath  . Lamotrigine Rash and Other (See Comments)  . Nitrofurantoin Hives  . Other Anaphylaxis, Hives and Swelling    Walnuts and pine nuts  . Peanut Oil Anaphylaxis, Hives,  Swelling and Rash  . Amoxicillin Hives and Rash    Has patient had a PCN reaction causing immediate rash, facial/tongue/throat swelling, SOB or lightheadedness with hypotension: Yes Has patient had a PCN reaction causing severe rash involving mucus membranes or skin necrosis: No Has patient had a PCN reaction that required hospitalization: No Has patient had a PCN reaction occurring within the last 10 years: No If all of the above answers are "NO", then may proceed with Cephalosporin use.  Marland Kitchen Doxycycline Hives and Rash  . Latex Rash and Other (See Comments)  . Meloxicam Itching and Other (See Comments)    Induces mania Interacts with Lithium  . Naproxen Hives and Other (See Comments)    Interacts with lithium  . Pramipexole Hives and Rash  . Prednisone Other (See Comments)    Interacts with Lithium  . Risperidone Hives and Rash  . Sulfa Antibiotics Rash    "that leaves scarring"  . Tape Itching and Rash    Steri-StripsT  . Cortisone Other (See Comments)    Exacerbates the mania of her bipolar  . Percocet [Oxycodone-Acetaminophen] Other (See Comments)    Severe dizziness    Medication list has been reviewed and updated.  Current Outpatient Medications on File Prior to Visit  Medication Sig Dispense Refill  . acetaminophen (TYLENOL) 500 MG tablet Take 1,000 mg by mouth every 6 (six) hours as needed for mild pain.    Marland Kitchen albuterol (PROVENTIL HFA;VENTOLIN HFA) 108 (90 Base) MCG/ACT inhaler Inhale 1-2 puffs into the lungs every 6 (six) hours as needed for wheezing or shortness of breath. 1 each 3  . diphenhydrAMINE (BENADRYL ALLERGY) 25 mg capsule Take  25 mg by mouth every 6 (six) hours as needed.    Marland Kitchen EPINEPHrine 0.3 mg/0.3 mL IJ SOAJ injection Inject 0.3 mg into the muscle as needed (allergic reaction).     . famciclovir (FAMVIR) 500 MG tablet Take 1 tablet (500 mg total) by mouth daily. 90 tablet 3  . fexofenadine (ALLEGRA) 180 MG tablet Take 180 mg by mouth at bedtime.    . fluticasone furoate-vilanterol (BREO ELLIPTA) 100-25 MCG/INH AEPB Inhale 1 puff into the lungs daily. 1 each 5  . hyoscyamine (LEVSIN, ANASPAZ) 0.125 MG tablet Take 1 tablet (0.125 mg total) by mouth as needed for cramping. Reported on 09/24/2015 10 tablet 2  . Levothyroxine Sodium 25 MCG CAPS Take 1 capsule (25 mcg total) by mouth daily before breakfast. (Patient not taking: Reported on 10/12/2018) 30 capsule 3  . LORazepam (ATIVAN) 0.5 MG tablet Take 1 tablet (0.5 mg total) by mouth every 8 (eight) hours. (Patient not taking: Reported on 10/12/2018) 20 tablet 0  . pantoprazole (PROTONIX) 40 MG tablet Take by mouth.    . pantoprazole (PROTONIX) 40 MG tablet Take 40 mg by mouth daily.    . rizatriptan (MAXALT-MLT) 10 MG disintegrating tablet Take 1 tablet (10 mg total) by mouth as needed for migraine. May repeat in 2 hours if needed 9 tablet 11  . topiramate (TOPAMAX) 50 MG tablet Take 1 tablet (50 mg total) by mouth 2 (two) times daily. (Patient not taking: Reported on 10/12/2018) 60 tablet 12  . UNABLE TO FIND Take by mouth. ELDERBERRY FRUIT ORAL     No current facility-administered medications on file prior to visit.     Review of Systems:  As per HPI- otherwise negative. No fever noted   Physical Examination: There were no vitals filed for this visit. There were no vitals filed for this  visit. There is no height or weight on file to calculate BMI. Ideal Body Weight:   Observed pt on camera and also spoke to her on the phone She does not sound in any distress   Assessment and Plan: Shortness of breath  CLL (chronic lymphocytic leukemia) (HCC)  Herpes zoster  without complication  Pt called to discuss her shingles- she is on famvir 500 daily but feels like her rash is continuing to worsen. Will have her take 500 TID for one week, then BID for suppression.  Asked her to let me know how this works for her.  Pt also has complaint of SOB - over the phone I cannot determine if this is just her allergies or something more serious. She has cancer so increased risk of PE.  We do not have any PPE here and are not to bring in ill patients.  [facility] and [facility] are able to see her, pt is directed to their office. We appreciate their help  Signed Abbe Amsterdam, MD

## 2018-11-07 ENCOUNTER — Other Ambulatory Visit: Payer: Self-pay

## 2018-11-07 ENCOUNTER — Ambulatory Visit (HOSPITAL_COMMUNITY)
Admission: EM | Admit: 2018-11-07 | Discharge: 2018-11-07 | Disposition: A | Discharge status: Home / Self Care | Payer: Medicare Other | Attending: Family Medicine | Admitting: Family Medicine

## 2018-11-07 ENCOUNTER — Ambulatory Visit (INDEPENDENT_AMBULATORY_CARE_PROVIDER_SITE_OTHER): Payer: Medicare Other

## 2018-11-07 ENCOUNTER — Ambulatory Visit (INDEPENDENT_AMBULATORY_CARE_PROVIDER_SITE_OTHER): Payer: Medicare Other | Admitting: Family Medicine

## 2018-11-07 ENCOUNTER — Encounter: Payer: Self-pay | Admitting: General Practice

## 2018-11-07 ENCOUNTER — Encounter: Payer: Self-pay | Admitting: Family Medicine

## 2018-11-07 ENCOUNTER — Encounter (HOSPITAL_COMMUNITY): Payer: Self-pay

## 2018-11-07 DIAGNOSIS — R05 Cough: Secondary | ICD-10-CM

## 2018-11-07 DIAGNOSIS — C9110 Chronic lymphocytic leukemia of B-cell type not having achieved remission: Secondary | ICD-10-CM

## 2018-11-07 DIAGNOSIS — R0602 Shortness of breath: Secondary | ICD-10-CM

## 2018-11-07 DIAGNOSIS — R059 Cough, unspecified: Secondary | ICD-10-CM

## 2018-11-07 DIAGNOSIS — F4310 Post-traumatic stress disorder, unspecified: Secondary | ICD-10-CM | POA: Diagnosis not present

## 2018-11-07 DIAGNOSIS — B029 Zoster without complications: Secondary | ICD-10-CM

## 2018-11-07 MED ORDER — AZITHROMYCIN 250 MG PO TABS: 250.0000 mg | ORAL_TABLET | Freq: Every day | ORAL | 0 refills | Status: DC

## 2018-11-07 MED ORDER — LORAZEPAM 0.5 MG PO TABS: 0.5000 mg | ORAL_TABLET | Freq: Two times a day (BID) | ORAL | 0 refills | Status: DC | PRN

## 2018-11-07 NOTE — ED Provider Notes (Signed)
Morgan [name]    CSN: [id number] Arrival date & time: 11/07/18  1642     History   Chief Complaint Chief Complaint  Patient presents with  . Shortness of Breath    HPI Morgan [name] is a 54 y.o. female.   She is presenting with cough with some mucus production.  Her symptoms have been present for the past three weeks.  Denies any fevers.  She does have a history of asthma and allergies.  She has been taking her asthma medications with limited improvement.  Has been at home by herself for about a month now.  Denies any fevers.  She has a history of leukemia but is not currently undergoing any treatment.  Has not had any recent travel.  HPI  Past Medical History:  Diagnosis Date  . Anxiety   . Arthritis   . Asthma   . Bipolar disorder ([place])   . Bulging lumbar disc 07/19/05  . Chronic headaches   . Chronic lymphocytic leukemia ([place]) 07/31/2016  . Colon polyps   . Degenerative disorder of bone   . Depression   . History of borderline personality disorder   . Hypothyroidism 2007   Developed after use of Lithium  . IBS (irritable bowel syndrome) 1995  . Pneumonia   . Proctitis   . Shingles 07/2018  . Substance abuse ([place])    ativan last month    Patient Active Problem List   Diagnosis Date Noted  . Fever 06/18/2018  . Sore throat 06/18/2018  . URI (upper respiratory infection) 06/18/2018  . Dysfunction of right eustachian tube 12/03/2017  . Piriformis syndrome of right side 04/22/2017  . Lateral epicondylitis of left elbow 04/06/2017  . Solitary pulmonary nodule 10/29/2016  . Chronic lymphocytic leukemia ([place]) 07/31/2016  . Mass in neck 04/17/2016  . Abdominal mass 04/17/2016  . Asthma 12/12/2015  . Multiple environmental allergies 12/12/2015  . Chronic lower back pain 09/12/2015  . Bipolar 1 disorder, depressed ([place]) 08/27/2015    Class: Chronic  . Other specified hypothyroidism 08/23/2015  . Borderline personality disorder ([place]) 08/15/2015  . Severe  benzodiazepine use disorder ([place]) 08/15/2015  . Alcohol use disorder, moderate, dependence ([place]) 08/15/2015  . PTSD (post-traumatic stress disorder) 08/15/2015  . History of bipolar disorder 08/15/2015  . Pre-syncope 05/17/2015  . Skull deformity 05/17/2015  . Patellofemoral syndrome of both knees 05/02/2015    Past Surgical History:  Procedure Laterality Date  . ABDOMINAL HYSTERECTOMY    . APPENDECTOMY    . BUNIONECTOMY Right 06/2012  . CHOLECYSTECTOMY    . OOPHORECTOMY    . SHOULDER OPEN ROTATOR CUFF REPAIR Right 03/2010  . TUBAL LIGATION      OB History    Gravida  2   Para  2   Term  2   Preterm  0   AB  0   Living  2     SAB  0   TAB  0   Ectopic  0   Multiple  0   Live Births               Home Medications    Prior to Admission medications   Medication Sig Start Date End Date Taking? Authorizing Provider  acetaminophen (TYLENOL) 500 MG tablet Take 1,000 mg by mouth every 6 (six) hours as needed for mild pain.    [provider]  albuterol (PROVENTIL HFA;VENTOLIN HFA) 108 (90 Base) MCG/ACT inhaler Inhale 1-2 puffs into the lungs every 6 (six)  hours as needed for wheezing or shortness of breath. 06/17/18   Binnie Rail, MD  azithromycin (ZITHROMAX) 250 MG tablet Take 1 tablet (250 mg total) by mouth daily. Take first 2 tablets together, then 1 every day until finished. 11/07/18   Rosemarie Ax, MD  diphenhydrAMINE (BENADRYL ALLERGY) 25 mg capsule Take 25 mg by mouth every 6 (six) hours as needed.    [provider]  EPINEPHrine 0.3 mg/0.3 mL IJ SOAJ injection Inject 0.3 mg into the muscle as needed (allergic reaction).  05/24/17   [provider]  famciclovir (FAMVIR) 500 MG tablet Take 1 tablet (500 mg total) by mouth daily. 10/12/18   Volanda Napoleon, MD  fexofenadine (ALLEGRA) 180 MG tablet Take 180 mg by mouth at bedtime.    [provider]  fluticasone furoate-vilanterol (BREO ELLIPTA) 100-25 MCG/INH AEPB  Inhale 1 puff into the lungs daily. 06/17/18   Binnie Rail, MD  hyoscyamine (LEVSIN, ANASPAZ) 0.125 MG tablet Take 1 tablet (0.125 mg total) by mouth as needed for cramping. Reported on 09/24/2015 07/05/17   Copland, Gay Filler, MD  Levothyroxine Sodium 25 MCG CAPS Take 1 capsule (25 mcg total) by mouth daily before breakfast. Patient not taking: Reported on 10/12/2018 04/29/18   Copland, Gay Filler, MD  LORazepam (ATIVAN) 0.5 MG tablet Take 1 tablet (0.5 mg total) by mouth every 12 (twelve) hours as needed for anxiety. 11/07/18   Copland, Gay Filler, MD  pantoprazole (PROTONIX) 40 MG tablet Take by mouth. 08/09/18 09/08/18  [provider]  pantoprazole (PROTONIX) 40 MG tablet Take 40 mg by mouth daily.    [provider]  rizatriptan (MAXALT-MLT) 10 MG disintegrating tablet Take 1 tablet (10 mg total) by mouth as needed for migraine. May repeat in 2 hours if needed 05/23/18   Penumalli, Earlean Polka, MD  topiramate (TOPAMAX) 50 MG tablet Take 1 tablet (50 mg total) by mouth 2 (two) times daily. Patient not taking: Reported on 10/12/2018 05/23/18   Penumalli, Earlean Polka, MD  UNABLE TO FIND Take by mouth. ELDERBERRY FRUIT ORAL    [provider]    Family History Family History  Problem Relation Age of Onset  . Depression Mother   . Hypertension Mother   . Post-traumatic stress disorder Sister   . Alcohol abuse Brother   . Drug abuse Brother   . Post-traumatic stress disorder Brother   . Thyroid disease Father   . Alzheimer's disease Father   . COPD Maternal Grandmother   . Heart disease Maternal Grandfather   . Arthritis Paternal Grandmother   . Diabetes Paternal Grandmother   . Depression Paternal Grandmother   . Alzheimer's disease Paternal Grandmother   . Heart disease Paternal Grandfather   . Coronary artery disease Paternal Grandfather   . Alcohol abuse Paternal Grandfather   . Colon cancer Neg Hx   . Breast cancer Neg Hx     Social History Social History    Tobacco Use  . Smoking status: Former Smoker    Last attempt to quit: 07/20/1980    Years since quitting: 38.3  . Smokeless tobacco: Never Used  Substance Use Topics  . Alcohol use: No    Alcohol/week: 0.0 standard drinks    Comment: unsure  . Drug use: Yes    Types: Benzodiazepines     Allergies   Aripiprazole; Ciprofloxacin; Lamotrigine; Nitrofurantoin; Other; Peanut oil; Amoxicillin; Doxycycline; Latex; Meloxicam; Naproxen; Pramipexole; Prednisone; Risperidone; Sulfa antibiotics; Tape; Cortisone; and Percocet [oxycodone-acetaminophen]   Review  of Systems Review of Systems  Constitutional: Negative for fever.  HENT: Negative for congestion.   Respiratory: Positive for cough and shortness of breath.   Cardiovascular: Negative for chest pain.  Gastrointestinal: Negative for abdominal pain.  Musculoskeletal: Negative for gait problem.  Skin: Negative for color change.  Neurological: Negative for weakness.     Physical Exam Triage Vital Signs ED Triage Vitals  Enc Vitals Group     BP --      Pulse Rate 11/07/18 1705 77     Resp 11/07/18 1705 18     Temp 11/07/18 1705 98.5 F (36.9 C)     Temp Source 11/07/18 1705 Oral     SpO2 11/07/18 1705 99 %     Weight 11/07/18 1706 168 lb (76.2 kg)     Height --      Head Circumference --      Peak Flow --      Pain Score 11/07/18 1706 2     Pain Loc --      Pain Edu? --      Excl. in [place]? --    No data found.  Updated Vital Signs Pulse 77   Temp 98.5 F (36.9 C) (Oral)   Resp 18   Wt 76.2 kg   SpO2 99%   BMI 29.29 kg/m   Visual Acuity Right Eye Distance:   Left Eye Distance:   Bilateral Distance:    Right Eye Near:   Left Eye Near:    Bilateral Near:     Physical Exam Gen: NAD, alert, cooperative with exam,  ENT: normal lips, normal nasal mucosa, tympanic membranes clear and intact bilaterally, normal oropharynx, no cervical lymphadenopathy Eye: normal EOM, normal conjunctiva and lids CV:  no edema, +2  pedal pulses, regular rate and rhythm, S1-S2   Resp: no accessory muscle use, non-labored, clear to auscultation bilaterally, no crackles or wheezes  Skin: no rashes, no areas of induration  Neuro: normal tone, normal sensation to touch Psych:  normal insight, alert and oriented MSK: Normal gait, normal strength    UC Treatments / Results  Labs (all labs ordered are listed, but only abnormal results are displayed) Labs Reviewed - No data to display  EKG None  Radiology Dg Chest 2 View  Result Date: 11/07/2018 CLINICAL DATA:  Worsening shortness of breath over the past 3 weeks. EXAM: CHEST - 2 VIEW COMPARISON:  Chest x-ray dated June 22, 2018. FINDINGS: The heart size and mediastinal contours are within normal limits. Both lungs are clear. The visualized skeletal structures are unremarkable. IMPRESSION: No active cardiopulmonary disease. Electronically Signed   By: Titus Dubin M.D.   On: 11/07/2018 18:55    Procedures Procedures (including critical care time)  Medications Ordered in UC Medications - No data to display  Initial Impression / Assessment and Plan / UC Course  I have reviewed the triage vital signs and the nursing notes.  Pertinent labs & imaging results that were available during my care of the patient were reviewed by me and considered in my medical decision making (see chart for details).     Morgan [name] is a 53 year old female is presenting with cough.  Her symptoms been present for about 3 weeks.  Denies any fevers.  She does suffer from leukemia but is currently not undergoing treatment.  She has been at home by herself for the past months.  Does not feel like her symptoms improved during this time.  She has been on her inhalers  that she normally takes.  Chest x-ray was not demonstrating a pneumonia.  Low likelihood for blood clot.  Will provide with azithromycin to have on hand if she fails to improve.  Counseled on supportive care and indications to return.   Final Clinical Impressions(s) / UC Diagnoses   Final diagnoses:  Cough     Discharge Instructions     Please try things such as zyrtec-D or allegra-D which is an antihistamine and decongestant.  Please try honey, vick's vapor rub, lozenges and humidifer for cough and sore throat  You can take the azithromycin if you feel like your symptoms are not improving. Please follow up with your regular doctor if your symptoms fail to improve.     ED Prescriptions    Medication Sig Dispense Auth. Provider   azithromycin (ZITHROMAX) 250 MG tablet Take 1 tablet (250 mg total) by mouth daily. Take first 2 tablets together, then 1 every day until finished. 6 tablet Rosemarie Ax, MD     Controlled Substance Prescriptions [place] Controlled Substance Registry consulted? Not Applicable   Rosemarie Ax, MD 11/07/18 1910

## 2018-11-07 NOTE — ED Triage Notes (Signed)
Pt  Cc SOB x 3 days.  Pt states her Lukemia is flaring and she has Shingles x 2 months. Pt states she has been using her inhalers.

## 2018-11-07 NOTE — Discharge Instructions (Signed)
Please try things such as zyrtec-D or allegra-D which is an antihistamine and decongestant.  Please try honey, vick's vapor rub, lozenges and humidifer for cough and sore throat  You can take the azithromycin if you feel like your symptoms are not improving. Please follow up with your regular doctor if your symptoms fail to improve.

## 2018-11-07 NOTE — Progress Notes (Signed)
[place] Spiritual Care Note  Had spiritual direction phone appt with [facility] as planned. She is taking steps forward in her discernment process, but also still struggling with shingles and other health concerns, including respiratory issues and trouble sleeping. Per orders from her PCP, she plans to be seen in urgent care this afternoon. Per pt, during our conversation her heart rate slowed and anxiety decreased, so she felt more emotionally ready for another medical appt. She plans to report back about health findings and how she is feeling by phone or email.   [place], [place], [facility] Pager [phone] Voicemail [phone]

## 2018-11-07 NOTE — Patient Instructions (Addendum)
Take famciclovir TID for one week, then try taking it twice a day - please let me know how this works for you   I hope that you have a good visit at urgent care and that all turns out ok with your breathing!

## 2018-11-16 ENCOUNTER — Encounter: Payer: Self-pay | Admitting: Family Medicine

## 2018-11-16 ENCOUNTER — Ambulatory Visit: Payer: Medicare Other | Admitting: Psychiatry

## 2018-11-17 DIAGNOSIS — C9110 Chronic lymphocytic leukemia of B-cell type not having achieved remission: Secondary | ICD-10-CM | POA: Diagnosis not present

## 2018-11-17 DIAGNOSIS — B029 Zoster without complications: Secondary | ICD-10-CM | POA: Diagnosis not present

## 2018-11-17 DIAGNOSIS — J45909 Unspecified asthma, uncomplicated: Secondary | ICD-10-CM | POA: Diagnosis not present

## 2018-11-18 ENCOUNTER — Encounter: Payer: Self-pay | Admitting: General Practice

## 2018-11-18 DIAGNOSIS — E039 Hypothyroidism, unspecified: Secondary | ICD-10-CM | POA: Insufficient documentation

## 2018-11-18 NOTE — Progress Notes (Signed)
[facility] Spiritual Care Note  Had Virtual Chaplain visit via WebEx per pt request, confirming pt's identity as usual with two forms of information. Morgan [name] is working hard to reframe her struggles, to find God in the midst of her challenges, and to build a support network around herself to combat isolation/loneliness/anxiety. Due to isolation and the wait to have an appt with Dr Ouida Sills, her counselor, we plan to to have WebEx appointments Monday and Thursday next week.   [place], [place], [facility] Pager [phone] Voicemail [phone]

## 2018-11-21 DIAGNOSIS — Z9221 Personal history of antineoplastic chemotherapy: Secondary | ICD-10-CM | POA: Diagnosis not present

## 2018-11-21 DIAGNOSIS — B029 Zoster without complications: Secondary | ICD-10-CM | POA: Diagnosis not present

## 2018-11-21 DIAGNOSIS — C9110 Chronic lymphocytic leukemia of B-cell type not having achieved remission: Secondary | ICD-10-CM | POA: Diagnosis not present

## 2018-11-21 DIAGNOSIS — D801 Nonfamilial hypogammaglobulinemia: Secondary | ICD-10-CM | POA: Diagnosis not present

## 2018-11-23 ENCOUNTER — Ambulatory Visit: Payer: Medicare Other | Admitting: Psychiatry

## 2018-11-24 ENCOUNTER — Encounter: Payer: Self-pay | Admitting: General Practice

## 2018-11-24 NOTE — Progress Notes (Signed)
[name] Spiritual Care Note  through [facility]. Mailing handwritten note of encouragement with [employer] online programming information as a check-in while groups are suspended during covid-19.   [place], [place], [facility] Pager [phone] Voicemail [phone]

## 2018-11-29 DIAGNOSIS — C9110 Chronic lymphocytic leukemia of B-cell type not having achieved remission: Secondary | ICD-10-CM | POA: Diagnosis not present

## 2018-11-30 ENCOUNTER — Encounter: Payer: Self-pay | Admitting: General Practice

## 2018-11-30 ENCOUNTER — Ambulatory Visit: Payer: Medicare Other | Admitting: Psychiatry

## 2018-11-30 NOTE — Progress Notes (Signed)
[place] Spiritual Care Note  Spoke with Morgan [name] by phone per her request for spiritual support and check-in about good news that she was excited about. She is reaching clarity about work and living arrangements, which have been in discernment for some weeks/months. Taking action and moving forward are a big source of audible relief and joy for her! She plans to be in touch again as needed.   [place], [place], [facility] Pager [phone] Voicemail [phone]

## 2018-12-01 DIAGNOSIS — R110 Nausea: Secondary | ICD-10-CM | POA: Diagnosis not present

## 2018-12-01 DIAGNOSIS — B029 Zoster without complications: Secondary | ICD-10-CM | POA: Diagnosis not present

## 2018-12-01 DIAGNOSIS — B0229 Other postherpetic nervous system involvement: Secondary | ICD-10-CM | POA: Diagnosis not present

## 2018-12-01 DIAGNOSIS — C9110 Chronic lymphocytic leukemia of B-cell type not having achieved remission: Secondary | ICD-10-CM | POA: Diagnosis not present

## 2018-12-01 DIAGNOSIS — Z9221 Personal history of antineoplastic chemotherapy: Secondary | ICD-10-CM | POA: Diagnosis not present

## 2018-12-01 DIAGNOSIS — T375X5A Adverse effect of antiviral drugs, initial encounter: Secondary | ICD-10-CM | POA: Diagnosis not present

## 2018-12-05 DIAGNOSIS — C9110 Chronic lymphocytic leukemia of B-cell type not having achieved remission: Secondary | ICD-10-CM | POA: Diagnosis not present

## 2018-12-09 ENCOUNTER — Encounter: Payer: Self-pay | Admitting: General Practice

## 2018-12-09 ENCOUNTER — Encounter: Payer: Self-pay | Admitting: Family Medicine

## 2018-12-09 DIAGNOSIS — F4310 Post-traumatic stress disorder, unspecified: Secondary | ICD-10-CM

## 2018-12-09 MED ORDER — LORAZEPAM 0.5 MG PO TABS: 0.5000 mg | ORAL_TABLET | Freq: Two times a day (BID) | ORAL | 1 refills | Status: DC | PRN

## 2018-12-09 NOTE — Progress Notes (Signed)
[place] Spiritual Care Note  Spoke with Morgan [name] briefly today. She was groggy from meds but said that at least she was able to sleep. We plan to talk more on Tuesday because she is feeling dispirited about social isolation and lengthy health problems (shingles and side effects from antivirals).   [place], [place], [facility] Pager [phone] Voicemail [phone]

## 2018-12-13 ENCOUNTER — Encounter: Payer: Self-pay | Admitting: General Practice

## 2018-12-13 NOTE — Progress Notes (Signed)
[place] Spiritual Care Note  Spoke with Morgan [name] today instead of Tuesday due to scheduling needs. Per pt, she is still struggling with depression and discernment about how best to handle work (which is now two part-time jobs, a recent change); working is giving a sense of contribution, purpose, and structure, which does boost morale. We plan to f/u for a brief call Friday morning to check in about depression and self-care.   [place], [place], [facility] Pager [phone] Voicemail [phone]

## 2018-12-14 ENCOUNTER — Ambulatory Visit: Payer: Medicare Other | Admitting: Psychiatry

## 2018-12-15 ENCOUNTER — Ambulatory Visit: Payer: Self-pay

## 2018-12-15 DIAGNOSIS — C9110 Chronic lymphocytic leukemia of B-cell type not having achieved remission: Secondary | ICD-10-CM | POA: Diagnosis not present

## 2018-12-15 DIAGNOSIS — B029 Zoster without complications: Secondary | ICD-10-CM | POA: Diagnosis not present

## 2018-12-15 NOTE — Telephone Encounter (Signed)
Pt. Called to report onset of very bad taste in mouth and strong smell in nose, since last night about 9:30 PM.  Also had mild headache, that has subsided.  Reported she has a different feeling in her throat and tip of tongue; described "like a tingling sensation".  Denied any redness or swelling in mouth/ throat.  Denied sores in mouth.  Denied any dental issues.  Denied fever; reported having chills early in the week; "but that's not unusual for me." Denied shortness of breath. Stated has an infrequent cough, but said this is not unusual, and is not a new development.  Denied any known exposure to COVID 19 but does work in [employer]; denied travel in past 2 weeks.  Also reported she had bilat. back pain early in week, that is now gone.  Reported she had noticed decreased output in urine, compared to her usual; drinks large quantity of water, and feels her intake is more than her output.  Denied any burning in urine; denied change in color of urine.  Reported some urgency to urinate a couple nights ago.  Reported she has been on antiviral medication since 3/27, and on higher doses of antivirals over past 3 weeks.  Called [facility] for Dr. Lorelei Pont; advised that Dr. Lorelei Pont recommends pt. go to the ER, due to poss. reaction to her medication.  Pt. Advised of PCP recommendation.  Stated she is going to contact her Infectious Disease doctor, because she has an appt. with her today.  Declined going to ER at this time.    Reason for Disposition . Weak immune system (e.g., HIV positive, cancer chemo, splenectomy, organ transplant, chronic steroids)  Answer Assessment - Initial Assessment Questions 1. SYMPTOM: "What's the main symptom you're concerned about?" (e.g., dry mouth. chapped lips, lump)     Very bad taste in mouth, and strong smell in nose 2. ONSET: "When did the symptoms start?"     Last night about 9:15 PM 3. PAIN: "Is there any pain?" If so, ask: "How bad is it?" (Scale: 1-10; mild, moderate,  severe)     Denied pain 4. CAUSE: "What do you think is causing the symptoms?"     unknown 5. OTHER SYMPTOMS: "Do you have any other symptoms?" (e.g., fever, sore throat, toothache, swelling)     Back of throat feels different; difficult to describe; "feels funny at the back of throat and tip of tongue; strong smell in nose with tingling in nose; no redness or swelling of throat; denied shortness of breath, denied fever,nausea. C/o intermittent diarrhea x 1 mo.  Normal BM today.  Infreq. Cough- "not unusual"; unaware of known exposure to COVID, but works at [employer] and has recently been out more; denied travel  6. PREGNANCY: "Is there any chance you are pregnant?" "When was your last menstrual period?"     Had partial hysterectomy  Protocols used: MOUTH [facility]

## 2018-12-15 NOTE — Telephone Encounter (Signed)
FYI

## 2018-12-16 ENCOUNTER — Encounter: Payer: Self-pay | Admitting: General Practice

## 2018-12-16 DIAGNOSIS — J029 Acute pharyngitis, unspecified: Secondary | ICD-10-CM | POA: Diagnosis not present

## 2018-12-16 DIAGNOSIS — R430 Anosmia: Secondary | ICD-10-CM | POA: Diagnosis not present

## 2018-12-16 DIAGNOSIS — Z87891 Personal history of nicotine dependence: Secondary | ICD-10-CM | POA: Diagnosis not present

## 2018-12-16 DIAGNOSIS — R52 Pain, unspecified: Secondary | ICD-10-CM | POA: Diagnosis not present

## 2018-12-16 DIAGNOSIS — R5383 Other fatigue: Secondary | ICD-10-CM | POA: Diagnosis not present

## 2018-12-16 NOTE — Progress Notes (Signed)
[place] Spiritual Care Note  Spoke with Morgan [name] by phone to help with questions for discernment regarding a health matter. She values Spiritual Care as a framework for processing and reflection. We plan to follow up next week when she knows more about her decision-making process.   [place], [place], [facility] Pager [phone] Voicemail [phone]

## 2018-12-19 ENCOUNTER — Telehealth: Payer: Self-pay | Admitting: *Deleted

## 2018-12-19 DIAGNOSIS — R110 Nausea: Secondary | ICD-10-CM | POA: Diagnosis not present

## 2018-12-19 DIAGNOSIS — R5383 Other fatigue: Secondary | ICD-10-CM | POA: Diagnosis not present

## 2018-12-19 DIAGNOSIS — M545 Low back pain: Secondary | ICD-10-CM | POA: Diagnosis not present

## 2018-12-19 DIAGNOSIS — C9110 Chronic lymphocytic leukemia of B-cell type not having achieved remission: Secondary | ICD-10-CM | POA: Diagnosis not present

## 2018-12-19 DIAGNOSIS — J029 Acute pharyngitis, unspecified: Secondary | ICD-10-CM | POA: Diagnosis not present

## 2018-12-19 DIAGNOSIS — B0229 Other postherpetic nervous system involvement: Secondary | ICD-10-CM | POA: Diagnosis not present

## 2018-12-19 NOTE — Telephone Encounter (Signed)
Copied from [place] [id number]. Topic: General - Other >> Dec 19, 2018 11:13 AM Antonieta Iba C wrote: Reason for CRM:   pt has been seen by her infectious disease Dr and was told that she need to have some labs completed. Pt says that she has received a negative covid result. Pt says that she has been having really bad back pain and was advised to have labs.   Pt says to her knowledge she needs a Urine test, CBC differential because pt has Lukemia and also CMP.    Please assist /advise.

## 2018-12-20 ENCOUNTER — Encounter: Payer: Self-pay | Admitting: Family Medicine

## 2018-12-26 ENCOUNTER — Ambulatory Visit (INDEPENDENT_AMBULATORY_CARE_PROVIDER_SITE_OTHER): Payer: Medicare Other | Admitting: Psychology

## 2018-12-26 DIAGNOSIS — F3130 Bipolar disorder, current episode depressed, mild or moderate severity, unspecified: Secondary | ICD-10-CM | POA: Diagnosis not present

## 2018-12-26 DIAGNOSIS — F603 Borderline personality disorder: Secondary | ICD-10-CM

## 2018-12-29 DIAGNOSIS — M79602 Pain in left arm: Secondary | ICD-10-CM | POA: Diagnosis not present

## 2018-12-29 DIAGNOSIS — Z5320 Procedure and treatment not carried out because of patient's decision for unspecified reasons: Secondary | ICD-10-CM | POA: Diagnosis not present

## 2018-12-29 DIAGNOSIS — J45909 Unspecified asthma, uncomplicated: Secondary | ICD-10-CM | POA: Diagnosis not present

## 2018-12-29 DIAGNOSIS — C9110 Chronic lymphocytic leukemia of B-cell type not having achieved remission: Secondary | ICD-10-CM | POA: Diagnosis not present

## 2018-12-29 DIAGNOSIS — M5412 Radiculopathy, cervical region: Secondary | ICD-10-CM | POA: Diagnosis not present

## 2018-12-29 DIAGNOSIS — M79661 Pain in right lower leg: Secondary | ICD-10-CM | POA: Diagnosis not present

## 2018-12-29 DIAGNOSIS — Z79899 Other long term (current) drug therapy: Secondary | ICD-10-CM | POA: Diagnosis not present

## 2018-12-29 DIAGNOSIS — R072 Precordial pain: Secondary | ICD-10-CM | POA: Diagnosis not present

## 2018-12-29 DIAGNOSIS — B029 Zoster without complications: Secondary | ICD-10-CM | POA: Diagnosis not present

## 2018-12-30 ENCOUNTER — Encounter: Payer: Self-pay | Admitting: General Practice

## 2018-12-30 DIAGNOSIS — R072 Precordial pain: Secondary | ICD-10-CM | POA: Diagnosis not present

## 2018-12-30 NOTE — Progress Notes (Signed)
[place] Spiritual Care Note  Phoned Morgan [name] per her request via email, providing empathic listening, emotional support, pastoral reflection, and prayer as she processed a recent trip, recent medical appointments, and emotional fatigue from ongoing health struggles. Her faith is very important to her, and God is feeling less accessible right now, which is a challenging stressor. Another stressor is lack of social support. Because her main therapist, Dr Ouida Sills, has been unavailable during covid, Morgan [name] has found another counselor from the same practice for an additional layer of emotional and therapeutic support. Plan to f/u by phone next week.   [place], [place], [facility] Pager [phone] Voicemail [phone]

## 2019-01-09 ENCOUNTER — Encounter: Payer: Self-pay | Admitting: General Practice

## 2019-01-09 NOTE — Progress Notes (Signed)
[place] Note  Reached Morgan [name] by phone as follow-up to her emails about recent joys and processing (hike and chats with friends, "I am" poem reflecting on identity and personhood). Social contact is lifting her mood and outlook.   [place], [place], [facility] Pager [phone] Voicemail [phone]

## 2019-01-11 ENCOUNTER — Ambulatory Visit (INDEPENDENT_AMBULATORY_CARE_PROVIDER_SITE_OTHER): Payer: Medicare Other | Admitting: Psychology

## 2019-01-11 DIAGNOSIS — F3130 Bipolar disorder, current episode depressed, mild or moderate severity, unspecified: Secondary | ICD-10-CM | POA: Diagnosis not present

## 2019-01-11 DIAGNOSIS — F603 Borderline personality disorder: Secondary | ICD-10-CM | POA: Diagnosis not present

## 2019-01-12 ENCOUNTER — Other Ambulatory Visit: Payer: Self-pay

## 2019-01-12 ENCOUNTER — Ambulatory Visit: Payer: Self-pay | Admitting: Hematology & Oncology

## 2019-01-17 ENCOUNTER — Ambulatory Visit: Payer: Medicare Other | Admitting: Psychology

## 2019-01-19 ENCOUNTER — Encounter: Payer: Self-pay | Admitting: General Practice

## 2019-01-19 NOTE — Progress Notes (Signed)
[place] Spiritual Care Note  Phoned Morgan [name] per her request, providing empathic listening, spiritual and emotional support, and pastoral reflection as she processed recent work and life concerns. Social isolation remains a challenge. Her goals for the weekend are job search and gentle self-care. [place] well as an additional layer of support and plans to reach out again when needed.   [place], [place], [facility] Pager [phone] Voicemail [phone]

## 2019-01-24 ENCOUNTER — Ambulatory Visit: Payer: Medicare Other | Admitting: Psychology

## 2019-01-25 ENCOUNTER — Other Ambulatory Visit: Payer: Self-pay

## 2019-01-25 ENCOUNTER — Ambulatory Visit (INDEPENDENT_AMBULATORY_CARE_PROVIDER_SITE_OTHER): Payer: Medicare Other | Admitting: Psychology

## 2019-01-25 ENCOUNTER — Ambulatory Visit (HOSPITAL_BASED_OUTPATIENT_CLINIC_OR_DEPARTMENT_OTHER)
Admission: RE | Admit: 2019-01-25 | Discharge: 2019-01-25 | Disposition: A | Discharge status: Home / Self Care | Payer: Medicare Other | Source: Ambulatory Visit | Attending: Family Medicine | Admitting: Family Medicine

## 2019-01-25 DIAGNOSIS — F3130 Bipolar disorder, current episode depressed, mild or moderate severity, unspecified: Secondary | ICD-10-CM | POA: Diagnosis not present

## 2019-01-25 DIAGNOSIS — Z1231 Encounter for screening mammogram for malignant neoplasm of breast: Secondary | ICD-10-CM | POA: Insufficient documentation

## 2019-01-25 DIAGNOSIS — F603 Borderline personality disorder: Secondary | ICD-10-CM

## 2019-01-25 DIAGNOSIS — Z1239 Encounter for other screening for malignant neoplasm of breast: Secondary | ICD-10-CM

## 2019-01-31 DIAGNOSIS — C9110 Chronic lymphocytic leukemia of B-cell type not having achieved remission: Secondary | ICD-10-CM | POA: Diagnosis not present

## 2019-02-02 ENCOUNTER — Telehealth: Payer: Self-pay | Admitting: Family Medicine

## 2019-02-02 NOTE — Telephone Encounter (Signed)
Spoke with Morgan [name] today. She stated that she has some questions regarding her thyroid and if it could be the cause of her recurring shingles. Patient would like for you to give her a call to further discuss issue.

## 2019-02-03 ENCOUNTER — Encounter: Payer: Self-pay | Admitting: Family Medicine

## 2019-02-03 DIAGNOSIS — E079 Disorder of thyroid, unspecified: Secondary | ICD-10-CM

## 2019-02-06 ENCOUNTER — Encounter: Payer: Self-pay | Admitting: General Practice

## 2019-02-06 NOTE — Progress Notes (Signed)
[place] Spiritual Care Note  Followed up with Morgan [name] by phone for spiritual and emotional support. Provided empathic listening that assisted her with identifying the core of what was bothering her (covid stress) and questions that she would like to ask her various providers. As always, she verbalized appreciation. Getting to the bottom of her distress appeared to relieve some anxiety and to help her focus on next steps for her own self-care. She plans to call again as needed.   [place], [place], [facility] Pager [phone] Voicemail [phone]

## 2019-02-08 ENCOUNTER — Ambulatory Visit (INDEPENDENT_AMBULATORY_CARE_PROVIDER_SITE_OTHER): Payer: Medicare Other | Admitting: Psychiatry

## 2019-02-08 ENCOUNTER — Ambulatory Visit: Payer: Medicare Other | Admitting: Psychology

## 2019-02-08 DIAGNOSIS — F4310 Post-traumatic stress disorder, unspecified: Secondary | ICD-10-CM

## 2019-02-08 DIAGNOSIS — F3173 Bipolar disorder, in partial remission, most recent episode manic: Secondary | ICD-10-CM

## 2019-02-10 ENCOUNTER — Other Ambulatory Visit: Payer: Self-pay

## 2019-02-10 ENCOUNTER — Encounter (HOSPITAL_BASED_OUTPATIENT_CLINIC_OR_DEPARTMENT_OTHER): Payer: Self-pay | Admitting: *Deleted

## 2019-02-10 ENCOUNTER — Emergency Department (HOSPITAL_BASED_OUTPATIENT_CLINIC_OR_DEPARTMENT_OTHER): Payer: Medicare Other

## 2019-02-10 ENCOUNTER — Emergency Department (HOSPITAL_BASED_OUTPATIENT_CLINIC_OR_DEPARTMENT_OTHER)
Admission: EM | Admit: 2019-02-10 | Discharge: 2019-02-10 | Disposition: A | Discharge status: Home / Self Care | Payer: Medicare Other | Attending: Emergency Medicine | Admitting: Emergency Medicine

## 2019-02-10 ENCOUNTER — Telehealth: Payer: Self-pay | Admitting: Cardiology

## 2019-02-10 DIAGNOSIS — Z91040 Latex allergy status: Secondary | ICD-10-CM | POA: Diagnosis not present

## 2019-02-10 DIAGNOSIS — R0602 Shortness of breath: Secondary | ICD-10-CM | POA: Diagnosis not present

## 2019-02-10 DIAGNOSIS — E039 Hypothyroidism, unspecified: Secondary | ICD-10-CM | POA: Diagnosis not present

## 2019-02-10 DIAGNOSIS — Z87891 Personal history of nicotine dependence: Secondary | ICD-10-CM | POA: Insufficient documentation

## 2019-02-10 DIAGNOSIS — R079 Chest pain, unspecified: Secondary | ICD-10-CM | POA: Diagnosis not present

## 2019-02-10 DIAGNOSIS — R590 Localized enlarged lymph nodes: Secondary | ICD-10-CM | POA: Diagnosis not present

## 2019-02-10 DIAGNOSIS — Z79899 Other long term (current) drug therapy: Secondary | ICD-10-CM | POA: Insufficient documentation

## 2019-02-10 DIAGNOSIS — Z9049 Acquired absence of other specified parts of digestive tract: Secondary | ICD-10-CM | POA: Insufficient documentation

## 2019-02-10 DIAGNOSIS — J45909 Unspecified asthma, uncomplicated: Secondary | ICD-10-CM | POA: Diagnosis not present

## 2019-02-10 DIAGNOSIS — R6883 Chills (without fever): Secondary | ICD-10-CM | POA: Diagnosis present

## 2019-02-10 DIAGNOSIS — R05 Cough: Secondary | ICD-10-CM | POA: Diagnosis not present

## 2019-02-10 DIAGNOSIS — C9110 Chronic lymphocytic leukemia of B-cell type not having achieved remission: Secondary | ICD-10-CM

## 2019-02-10 DIAGNOSIS — C772 Secondary and unspecified malignant neoplasm of intra-abdominal lymph nodes: Secondary | ICD-10-CM | POA: Diagnosis not present

## 2019-02-10 DIAGNOSIS — R1013 Epigastric pain: Secondary | ICD-10-CM

## 2019-02-10 DIAGNOSIS — K7689 Other specified diseases of liver: Secondary | ICD-10-CM | POA: Diagnosis not present

## 2019-02-10 LAB — COMPREHENSIVE METABOLIC PANEL
ALT: 12 U/L (ref 0–44)
AST: 21 U/L (ref 15–41)
Albumin: 4.2 g/dL (ref 3.5–5.0)
Alkaline Phosphatase: 53 U/L (ref 38–126)
Anion gap: 9 (ref 5–15)
BUN: 12 mg/dL (ref 6–20)
CO2: 23 mmol/L (ref 22–32)
Calcium: 9.5 mg/dL (ref 8.9–10.3)
Chloride: 104 mmol/L (ref 98–111)
Creatinine, Ser: 0.86 mg/dL (ref 0.44–1.00)
GFR calc Af Amer: >60 mL/min (ref >60)
GFR calc non Af Amer: >60 mL/min (ref >60)
Glucose, Bld: 116 mg/dL — ABNORMAL HIGH (ref 70–99)
Potassium: 4 mmol/L (ref 3.5–5.1)
Sodium: 136 mmol/L (ref 135–145)
Total Bilirubin: 0.5 mg/dL (ref 0.3–1.2)
Total Protein: 6.5 g/dL (ref 6.5–8.1)

## 2019-02-10 LAB — CBC WITH DIFFERENTIAL/PLATELET
Abs Immature Granulocytes: 0.02 10*3/uL (ref 0.00–0.07)
Basophils Absolute: 0.1 10*3/uL (ref 0.0–0.1)
Basophils Relative: 1 %
Eosinophils Absolute: 0.3 10*3/uL (ref 0.0–0.5)
Eosinophils Relative: 4 %
HCT: 44.4 % (ref 36.0–46.0)
Hemoglobin: 14.3 g/dL (ref 12.0–15.0)
Immature Granulocytes: 0 %
Lymphocytes Relative: 37 %
Lymphs Abs: 2.6 10*3/uL (ref 0.7–4.0)
MCH: 28.5 pg (ref 26.0–34.0)
MCHC: 32.2 g/dL (ref 30.0–36.0)
MCV: 88.4 fL (ref 80.0–100.0)
Monocytes Absolute: 1.2 10*3/uL — ABNORMAL HIGH (ref 0.1–1.0)
Monocytes Relative: 17 %
Neutro Abs: 2.9 10*3/uL (ref 1.7–7.7)
Neutrophils Relative %: 41 %
Platelets: 245 10*3/uL (ref 150–400)
RBC: 5.02 MIL/uL (ref 3.87–5.11)
RDW: 13.5 % (ref 11.5–15.5)
Smear Review: NORMAL
WBC Morphology: ABNORMAL
WBC: 7 10*3/uL (ref 4.0–10.5)
nRBC: 0 % (ref 0.0–0.2)

## 2019-02-10 LAB — URINALYSIS, ROUTINE W REFLEX MICROSCOPIC
Bilirubin Urine: NEGATIVE
Glucose, UA: NEGATIVE mg/dL
Hgb urine dipstick: NEGATIVE
Ketones, ur: NEGATIVE mg/dL
Leukocytes,Ua: NEGATIVE
Nitrite: NEGATIVE
Protein, ur: NEGATIVE mg/dL
Specific Gravity, Urine: <1.005 — ABNORMAL LOW (ref 1.005–1.030)
pH: 6 (ref 5.0–8.0)

## 2019-02-10 LAB — LIPASE, BLOOD: Lipase: 60 U/L — ABNORMAL HIGH (ref 11–51)

## 2019-02-10 LAB — TROPONIN I (HIGH SENSITIVITY): Troponin I (High Sensitivity): <2 ng/L (ref <18)

## 2019-02-10 MED ORDER — IOHEXOL 300 MG/ML  SOLN
100.0000 mL | Freq: Once | INTRAMUSCULAR | Status: AC | PRN
  Administered 2019-02-10: 100 mL via INTRAVENOUS

## 2019-02-10 MED ORDER — HYDROCODONE-ACETAMINOPHEN 5-325 MG PO TABS: 1.0000 | ORAL_TABLET | Freq: Four times a day (QID) | ORAL | 0 refills | Status: DC | PRN

## 2019-02-10 NOTE — ED Provider Notes (Signed)
CT scan reviewed.  There is lymphadenopathy consistent with patient's CLL history.  Patient has 4 lymph nodes in the 1 to 2 cm range.  Patient reports that her hematologist had told her at this time she does not require active treatment.  Lipase is marginally elevated at 60 and there is 1 lymph node adjacent to the pancreas.  This does coincide with the patient's worst area of pain per her report.  My recommendation for her at this time is to follow diet for pancreatitis and try 24 hours of fluids then reintroduce very bland and fat-free diet.  She is instructed to follow-up with her hematologist or PCP for monitoring of lipase and response to treatment.  At this time she is stable for discharge and outpatient management. Physical Exam  BP (!) 121/57   Pulse 83   Temp 98.5 F (36.9 C) (Oral)   Resp (!) 24   Ht 5' 3.5" (1.613 m)   Wt 81.2 kg   SpO2 100%   BMI 31.21 kg/m   Physical Exam  ED Course/Procedures     Procedures  MDM         Charlesetta Shanks, MD 02/10/19 1729

## 2019-02-10 NOTE — ED Notes (Signed)
Pt. Reports she has shingle on the  R side and has itching on both sides and is afraid she is starting to get the shingles on the L side now.

## 2019-02-10 NOTE — Telephone Encounter (Signed)
Patient saw BJM in 8/19 and is stating she is having a pulse in the 50s, nausea and dizziness. Her oxygen is fine. Please advise.

## 2019-02-10 NOTE — ED Notes (Signed)
Pt. Reports with movement she has pain

## 2019-02-10 NOTE — Discharge Instructions (Signed)
1.  Call your hematologist on Monday for recheck. 2.  Have a recheck of your lipase within the next 2 to 3 days.  Follow dietary instructions for pancreatitis.  At this time, your lab values are very low and you do not have inflammation of the pancreas on your CT scan however your pain is suggestive of pancreatitis.  This should be monitored by your doctor. 3.  Return to the emergency department if you develop vomiting, fever, increasing pain or other concerning symptoms.

## 2019-02-10 NOTE — ED Provider Notes (Signed)
[facility] EMERGENCY DEPARTMENT Provider Note   CSN: [id number] Arrival date & time: 02/10/19  1314     History   Chief Complaint Chief Complaint  Patient presents with  . Chest Pain  . Abdominal Pain    HPI Morgan [name] is a 53 y.o. female.     The history is provided by the patient and medical records. No language interpreter was used.   Morgan [name] is a 53 y.o. female who presents to the Emergency Department complaining of multiple complaints. She started feeling poorly yesterday with chills, headache, nausea.  She took her migraine medication and her HA went away.  Today she woke up with a sore throat.  She did have some SOB last night, which is unusual for her.  She did have some central chest pain transiently today.  She does not feel good - like her body is fighting something or something is going on.  She also complains of abdominal pain (left followed by right side) after eating. She denies any vomiting, diarrhea. She became concerned when she checked her pulse ox and noted that her heart rate was in the 50s. She called her cardiology, who recommended evaluation in the emergency department. She denies any fevers, dysuria. She does have a history of recurrent UTIs. Currently headache is completely resolved.      She has CLL, diagnosed in 2018 and treated at [facility] and [place].   Past Medical History:  Diagnosis Date  . Anxiety   . Arthritis   . Asthma   . Bipolar disorder ([place])   . Bulging lumbar disc 07/19/05  . Chronic headaches   . Chronic lymphocytic leukemia ([place]) 07/31/2016  . Colon polyps   . Degenerative disorder of bone   . Depression   . History of borderline personality disorder   . Hypothyroidism 2007   Developed after use of Lithium  . IBS (irritable bowel syndrome) 1995  . Pneumonia   . Proctitis   . Shingles 07/2018  . Substance abuse ([place])    ativan last month    Patient Active Problem List   Diagnosis Date Noted  . Fever  06/18/2018  . Sore throat 06/18/2018  . URI (upper respiratory infection) 06/18/2018  . Dysfunction of right eustachian tube 12/03/2017  . Piriformis syndrome of right side 04/22/2017  . Lateral epicondylitis of left elbow 04/06/2017  . Solitary pulmonary nodule 10/29/2016  . Chronic lymphocytic leukemia ([place]) 07/31/2016  . Mass in neck 04/17/2016  . Abdominal mass 04/17/2016  . Asthma 12/12/2015  . Multiple environmental allergies 12/12/2015  . Chronic lower back pain 09/12/2015  . Bipolar 1 disorder, depressed ([place]) 08/27/2015    Class: Chronic  . Other specified hypothyroidism 08/23/2015  . Borderline personality disorder ([place]) 08/15/2015  . Severe benzodiazepine use disorder ([place]) 08/15/2015  . Alcohol use disorder, moderate, dependence ([place]) 08/15/2015  . PTSD (post-traumatic stress disorder) 08/15/2015  . History of bipolar disorder 08/15/2015  . Pre-syncope 05/17/2015  . Skull deformity 05/17/2015  . Patellofemoral syndrome of both knees 05/02/2015    Past Surgical History:  Procedure Laterality Date  . ABDOMINAL HYSTERECTOMY    . APPENDECTOMY    . BUNIONECTOMY Right 06/2012  . CHOLECYSTECTOMY    . OOPHORECTOMY    . SHOULDER OPEN ROTATOR CUFF REPAIR Right 03/2010  . TUBAL LIGATION       OB History    Gravida  2   Para  2   Term  2   Preterm  0  AB  0   Living  2     SAB  0   TAB  0   Ectopic  0   Multiple  0   Live Births               Home Medications    Prior to Admission medications   Medication Sig Start Date End Date Taking? Authorizing Provider  acetaminophen (TYLENOL) 500 MG tablet Take 1,000 mg by mouth every 6 (six) hours as needed for mild pain.    [provider]  albuterol (PROVENTIL HFA;VENTOLIN HFA) 108 (90 Base) MCG/ACT inhaler Inhale 1-2 puffs into the lungs every 6 (six) hours as needed for wheezing or shortness of breath. 06/17/18   Binnie Rail, MD  azithromycin (ZITHROMAX) 250 MG tablet Take 1 tablet (250  mg total) by mouth daily. Take first 2 tablets together, then 1 every day until finished. 11/07/18   Rosemarie Ax, MD  diphenhydrAMINE (BENADRYL ALLERGY) 25 mg capsule Take 25 mg by mouth every 6 (six) hours as needed.    [provider]  EPINEPHrine 0.3 mg/0.3 mL IJ SOAJ injection Inject 0.3 mg into the muscle as needed (allergic reaction).  05/24/17   [provider]  famciclovir (FAMVIR) 500 MG tablet Take 1 tablet (500 mg total) by mouth daily. 10/12/18   Volanda Napoleon, MD  fexofenadine (ALLEGRA) 180 MG tablet Take 180 mg by mouth at bedtime.    [provider]  fluticasone furoate-vilanterol (BREO ELLIPTA) 100-25 MCG/INH AEPB Inhale 1 puff into the lungs daily. 06/17/18   Binnie Rail, MD  hyoscyamine (LEVSIN, ANASPAZ) 0.125 MG tablet Take 1 tablet (0.125 mg total) by mouth as needed for cramping. Reported on 09/24/2015 07/05/17   Copland, Gay Filler, MD  Levothyroxine Sodium 25 MCG CAPS Take 1 capsule (25 mcg total) by mouth daily before breakfast. Patient not taking: Reported on 10/12/2018 04/29/18   Copland, Gay Filler, MD  LORazepam (ATIVAN) 0.5 MG tablet Take 1 tablet (0.5 mg total) by mouth every 12 (twelve) hours as needed for anxiety. 12/09/18   Copland, Gay Filler, MD  pantoprazole (PROTONIX) 40 MG tablet Take by mouth. 08/09/18 09/08/18  [provider]  pantoprazole (PROTONIX) 40 MG tablet Take 40 mg by mouth daily.    [provider]  rizatriptan (MAXALT-MLT) 10 MG disintegrating tablet Take 1 tablet (10 mg total) by mouth as needed for migraine. May repeat in 2 hours if needed 05/23/18   Penumalli, Earlean Polka, MD  topiramate (TOPAMAX) 50 MG tablet Take 1 tablet (50 mg total) by mouth 2 (two) times daily. Patient not taking: Reported on 10/12/2018 05/23/18   Penumalli, Earlean Polka, MD  UNABLE TO FIND Take by mouth. ELDERBERRY FRUIT ORAL    [provider]    Family History Family History  Problem Relation Age of Onset  . Depression  Mother   . Hypertension Mother   . Post-traumatic stress disorder Sister   . Alcohol abuse Brother   . Drug abuse Brother   . Post-traumatic stress disorder Brother   . Thyroid disease Father   . Alzheimer's disease Father   . COPD Maternal Grandmother   . Heart disease Maternal Grandfather   . Arthritis Paternal Grandmother   . Diabetes Paternal Grandmother   . Depression Paternal Grandmother   . Alzheimer's disease Paternal Grandmother   . Heart disease Paternal Grandfather   . Coronary artery disease Paternal Grandfather   . Alcohol abuse Paternal Grandfather   .  Colon cancer Neg Hx   . Breast cancer Neg Hx     Social History Social History   Tobacco Use  . Smoking status: Former Smoker    Quit date: 07/20/1980    Years since quitting: 38.5  . Smokeless tobacco: Never Used  Substance Use Topics  . Alcohol use: No    Alcohol/week: 0.0 standard drinks    Comment: unsure  . Drug use: Yes    Types: Benzodiazepines     Allergies   Aripiprazole, Ciprofloxacin, Lamotrigine, Nitrofurantoin, Other, Peanut oil, Amoxicillin, Doxycycline, Latex, Meloxicam, Naproxen, Pramipexole, Prednisone, Risperidone, Sulfa antibiotics, Tape, Cortisone, and Percocet [oxycodone-acetaminophen]   Review of Systems Review of Systems  All other systems reviewed and are negative.    Physical Exam Updated Vital Signs BP 132/86   Pulse 78   Temp 98.5 F (36.9 C) (Oral)   Resp 18   Ht 5' 3.5" (1.613 m)   Wt 81.2 kg   SpO2 100%   BMI 31.21 kg/m   Physical Exam Vitals signs and nursing note reviewed.  Constitutional:      Appearance: She is well-developed.  HENT:     Head: Normocephalic and atraumatic.  Cardiovascular:     Rate and Rhythm: Normal rate and regular rhythm.     Heart sounds: No murmur.  Pulmonary:     Effort: Pulmonary effort is normal. No respiratory distress.     Breath sounds: Normal breath sounds.  Abdominal:     Palpations: Abdomen is soft.     Tenderness:  There is no guarding or rebound.     Comments: Healing shingles lesions to the right flank and right upper quadrant. Moderate generalized tenderness, greatest over the lower abdomen  Musculoskeletal:        General: No swelling or tenderness.  Skin:    General: Skin is warm and dry.     Capillary Refill: Capillary refill takes less than 2 seconds.  Neurological:     Mental Status: She is alert and oriented to person, place, and time.  Psychiatric:        Behavior: Behavior normal.      ED Treatments / Results  Labs (all labs ordered are listed, but only abnormal results are displayed) Labs Reviewed  CBC WITH DIFFERENTIAL/PLATELET  COMPREHENSIVE METABOLIC PANEL  TROPONIN I (HIGH SENSITIVITY)    EKG EKG Interpretation  Date/Time:  Friday February 10 2019 13:25:26 EDT Ventricular Rate:  72 PR Interval:    QRS Duration: 97 QT Interval:  383 QTC Calculation: 420 R Axis:   48 Text Interpretation:  Sinus rhythm Low voltage, precordial leads No significant change since last tracing Confirmed by Quintella Reichert [phone]) on 02/10/2019 1:29:11 PM   Radiology No results found.  Procedures Procedures (including critical care time)  Medications Ordered in ED Medications - No data to display   Initial Impression / Assessment and Plan / ED Course  I have reviewed the triage vital signs and the nursing notes.  Pertinent labs & imaging results that were available during my care of the patient were reviewed by me and considered in my medical decision making (see chart for details).       Patient here for evaluation of multiple complaints but predominantly abdominal pain. She is non-toxic appearing on evaluation and in no respiratory distress. She does have tenderness on exam without peritoneal findings. Given her abdominal pain, nausea multiple prior abdominal surgeries plan to obtain CT abdomen pelvis to further evaluate her symptoms. Patient care transferred pending CT,  lipase and  urinalysis. Final Clinical Impressions(s) / ED Diagnoses   Final diagnoses:  None    ED Discharge Orders    None       Quintella Reichert, MD 02/10/19 [phone]

## 2019-02-10 NOTE — Telephone Encounter (Signed)
Patient states that she has been having episodes for 2 days.  During the episodes she has nausea and dizziness.  Her heart rate has been as low as 50.    She reports shortness of breath in the middle of the night last night as well as nausea.  At the time of the call she reports feeling like she is going to throw up and having chest tightness and pain in the center of her chest.    Patient was advised to call 911 or report to nearest ED as soon as possible.  Patient agreed to plan and verbalized understanding.

## 2019-02-10 NOTE — ED Triage Notes (Signed)
Nausea yesterday and today. She had a headache yesterday that was relieved with her migraine medication. She woke with a sore throat that went away on it's own. Dizziness this am. Abdominal pain since eating. Chest pain for 2 hours.

## 2019-02-11 LAB — URINE CULTURE: Culture: NO GROWTH

## 2019-02-14 ENCOUNTER — Ambulatory Visit (INDEPENDENT_AMBULATORY_CARE_PROVIDER_SITE_OTHER): Payer: Medicare Other | Admitting: Psychiatry

## 2019-02-14 DIAGNOSIS — F4310 Post-traumatic stress disorder, unspecified: Secondary | ICD-10-CM | POA: Diagnosis not present

## 2019-02-14 DIAGNOSIS — F3173 Bipolar disorder, in partial remission, most recent episode manic: Secondary | ICD-10-CM | POA: Diagnosis not present

## 2019-02-15 NOTE — Telephone Encounter (Signed)
Pt called to schedule labs and appt for ER f/u. Pt was seen for epigastric pain, sob, CP, dizziness, and nausea. Pt concerned about recent labs for lipase and glucose. Should pt be schedule for in office or virtual f/u? She asked about coming in for labs too.   Pt was not tested for COVID when in ER 7/24.   Call back # [phone]

## 2019-02-16 ENCOUNTER — Encounter: Payer: Self-pay | Admitting: Family Medicine

## 2019-02-16 ENCOUNTER — Other Ambulatory Visit: Payer: Self-pay

## 2019-02-16 ENCOUNTER — Ambulatory Visit (INDEPENDENT_AMBULATORY_CARE_PROVIDER_SITE_OTHER): Payer: Medicare Other | Admitting: Family Medicine

## 2019-02-16 VITALS — BP 112/80 | HR 80 | Temp 98.5°F | Resp 16 | Ht 63.5 in | Wt 173.0 lb

## 2019-02-16 DIAGNOSIS — B029 Zoster without complications: Secondary | ICD-10-CM

## 2019-02-16 DIAGNOSIS — Z131 Encounter for screening for diabetes mellitus: Secondary | ICD-10-CM | POA: Diagnosis not present

## 2019-02-16 DIAGNOSIS — E079 Disorder of thyroid, unspecified: Secondary | ICD-10-CM

## 2019-02-16 DIAGNOSIS — C9110 Chronic lymphocytic leukemia of B-cell type not having achieved remission: Secondary | ICD-10-CM

## 2019-02-16 DIAGNOSIS — R748 Abnormal levels of other serum enzymes: Secondary | ICD-10-CM | POA: Diagnosis not present

## 2019-02-16 LAB — T3, FREE: T3, Free: 2.9 pg/mL (ref 2.3–4.2)

## 2019-02-16 LAB — TSH: TSH: 4.99 u[IU]/mL — ABNORMAL HIGH (ref 0.35–4.50)

## 2019-02-16 LAB — LIPASE: Lipase: 64 U/L — ABNORMAL HIGH (ref 11.0–59.0)

## 2019-02-16 LAB — HEMOGLOBIN A1C: Hgb A1c MFr Bld: 5.5 % (ref 4.6–6.5)

## 2019-02-16 NOTE — Patient Instructions (Addendum)
I will be in touch with your labs asap  Perhaps try a 1/2 maxalt dose for headache- this may give fewer side effects I will check on your thyroid and blood sugar, and your lipase today   Please let us know if any other concerns or worsening

## 2019-02-16 NOTE — Progress Notes (Addendum)
[facility] [address], [place], [place] [address] [phone]  Date:  02/16/2019   Name:  Morgan [name]   DOB:  1965-10-24   MRN:  [id number]  PCP:  Darreld Mclean, MD    Chief Complaint: ER follow up (low pulse rate, nausea, lightheadedness, chest pain) and Abdominal Pain   History of Present Illness:  Morgan [name] is a 53 y.o. very pleasant female patient who presents with the following:  Office visit today-patient with CLL diagnosed in January 2018, asthma, IBS, hypothyroidism, bipolar disorder She saw hematologist at [facility] about 2 weeks ago-she is not currently on any CLL treatment She was seen in the ER on 7/24 with chills, headache, nausea, heart rate in the 50s She had a chest film, and CT of abdomen pelvis as below Her lipase was marginally elevated at 72, she was advised to follow pancreatitis diet for 24 hours and then reintroduce a bland diet.    She notes that she continues to have the chronic shingles on her right side.  She is on suppressive famvir for same .  She feels like she sometimes gets new vesicles/ new rash outbreak  She would like to check her thyroid and rule out DM/ have A1c today   She is still taking famvir daily and is seeing her ID doc next week- [facility] She continues to notice a feeling of bloating and abd discomfort with PO intake. This is not really a new issue however is a bit worse than her normal now She is drinking a ginger tea for her nausea  She has not vomited during this illness Intermittent diarrhea, last episode was about 2 weeks ago   She was able to walk for an hour the last 2 days for exercise- this does make her shingles hurt more but she did it anyway as she craved the outdoor activity   recently she has noted that her maxalt seemed to make her feel bad the day after she takes it, although it does get rid of her HA   Dg Chest 2 View  Result Date: 02/10/2019 CLINICAL  DATA:  Chest pain, cough EXAM: CHEST - 2 VIEW COMPARISON:  11/07/2018 FINDINGS: The heart size and mediastinal contours are within normal limits. Both lungs are clear. The visualized skeletal structures are unremarkable. IMPRESSION: No active cardiopulmonary disease. Electronically Signed   By: Davina Poke M.D.   On: 02/10/2019 15:11   Ct Abdomen Pelvis W Contrast  Result Date: 02/10/2019 CLINICAL DATA:  53 year old female with acute abdominal and pelvic pain with nausea for 1 day. EXAM: CT ABDOMEN AND PELVIS WITH CONTRAST TECHNIQUE: Multidetector CT imaging of the abdomen and pelvis was performed using the standard protocol following bolus administration of intravenous contrast. CONTRAST:  149mL OMNIPAQUE IOHEXOL 300 MG/ML  SOLN COMPARISON:  06/11/2018 CT FINDINGS: Lower chest: No acute abnormality. Hepatobiliary: Multiple hepatic cysts are again noted. Pancreas: Unremarkable Spleen: Unremarkable Adrenals/Urinary Tract: The kidneys, adrenal glands and bladder are unremarkable. Stomach/Bowel: Stomach is within normal limits. Appendix appears normal. No evidence of bowel wall thickening, distention, or inflammatory changes. Vascular/Lymphatic: Multiple new enlarged lymph nodes are identified within the UPPER abdomen, mesentery, retroperitoneum, pelvis and inguinal regions. Index lymph nodes include the following: A 2 cm lymph node between the pancreas and liver (series 2: Image 22) A 1.3 cm aortocaval node (2:42) A 2.5 cm LEFT pelvic sidewall node (2:74) A 1.4 cm RIGHT inguinal node (2:78). No  vascular abnormalities are identified. Reproductive: Status post hysterectomy. No adnexal masses. Other: No ascites, focal collection or pneumoperitoneum. Musculoskeletal: No acute or suspicious bony abnormalities noted. Mild degenerative disc disease at L5-S1 noted. IMPRESSION: 1. New abdominal and pelvic lymphadenopathy suspicious for lymphoproliferative disorder or lymphoma. 2. No other acute or significant  abnormalities noted. Electronically Signed   By: Margarette Canada M.D.   On: 02/10/2019 15:53   Mm 3d Screen Breast Bilateral  Result Date: 01/25/2019 CLINICAL DATA:  Screening. EXAM: DIGITAL SCREENING BILATERAL MAMMOGRAM WITH TOMO AND CAD COMPARISON:  Previous exam(s). ACR Breast Density Category b: There are scattered areas of fibroglandular density. FINDINGS: There are no findings suspicious for malignancy. Images were processed with CAD. IMPRESSION: No mammographic evidence of malignancy. A result letter of this screening mammogram will be mailed directly to the patient. RECOMMENDATION: Screening mammogram in one year. (Code:[id number]-B-01Y) BI-RADS CATEGORY  1: Negative. Electronically Signed   By: Lillia Mountain M.D.   On: 01/25/2019 16:10     Patient Active Problem List   Diagnosis Date Noted   Fever 06/18/2018   Sore throat 06/18/2018   URI (upper respiratory infection) 06/18/2018   Dysfunction of right eustachian tube 12/03/2017   Piriformis syndrome of right side 04/22/2017   Lateral epicondylitis of left elbow 04/06/2017   Solitary pulmonary nodule 10/29/2016   Chronic lymphocytic leukemia ([place]) 07/31/2016   Mass in neck 04/17/2016   Abdominal mass 04/17/2016   Asthma 12/12/2015   Multiple environmental allergies 12/12/2015   Chronic lower back pain 09/12/2015   Bipolar 1 disorder, depressed ([place]) 08/27/2015    Class: Chronic   Other specified hypothyroidism 08/23/2015   Borderline personality disorder ([place]) 08/15/2015   Severe benzodiazepine use disorder ([place]) 08/15/2015   Alcohol use disorder, moderate, dependence ([place]) 08/15/2015   PTSD (post-traumatic stress disorder) 08/15/2015   History of bipolar disorder 08/15/2015   Pre-syncope 05/17/2015   Skull deformity 05/17/2015   Patellofemoral syndrome of both knees 05/02/2015    Past Medical History:  Diagnosis Date   Anxiety    Arthritis    Asthma    Bipolar disorder ([place])    Bulging lumbar disc  07/19/05   Chronic headaches    Chronic lymphocytic leukemia ([place]) 07/31/2016   Colon polyps    Degenerative disorder of bone    Depression    History of borderline personality disorder    Hypothyroidism 2007   Developed after use of Lithium   IBS (irritable bowel syndrome) 1995   Pneumonia    Proctitis    Shingles 07/2018   Substance abuse ([place])    ativan last month    Past Surgical History:  Procedure Laterality Date   ABDOMINAL HYSTERECTOMY     APPENDECTOMY     BUNIONECTOMY Right 06/2012   CHOLECYSTECTOMY     OOPHORECTOMY     SHOULDER OPEN ROTATOR CUFF REPAIR Right 03/2010   TUBAL LIGATION      Social History   Tobacco Use   Smoking status: Former Smoker    Quit date: 07/20/1980    Years since quitting: 38.6   Smokeless tobacco: Never Used  Substance Use Topics   Alcohol use: No    Alcohol/week: 0.0 standard drinks    Comment: unsure   Drug use: Yes    Types: Benzodiazepines    Family History  Problem Relation Age of Onset   Depression Mother    Hypertension Mother    Post-traumatic stress disorder Sister    Alcohol abuse Brother    Drug  abuse Brother    Post-traumatic stress disorder Brother    Thyroid disease Father    Alzheimer's disease Father    COPD Maternal Grandmother    Heart disease Maternal Grandfather    Arthritis Paternal Grandmother    Diabetes Paternal Grandmother    Depression Paternal Grandmother    Alzheimer's disease Paternal Grandmother    Heart disease Paternal Grandfather    Coronary artery disease Paternal Grandfather    Alcohol abuse Paternal Grandfather    Colon cancer Neg Hx    Breast cancer Neg Hx     Allergies  Allergen Reactions   Aripiprazole Anaphylaxis and Swelling   Ciprofloxacin Shortness Of Breath   Lamotrigine Rash and Other (See Comments)   Nitrofurantoin Hives   Other Anaphylaxis, Hives and Swelling    Walnuts and pine nuts   Peanut Oil Anaphylaxis, Hives,  Swelling and Rash   Amoxicillin Hives and Rash    Has patient had a PCN reaction causing immediate rash, facial/tongue/throat swelling, SOB or lightheadedness with hypotension: Yes Has patient had a PCN reaction causing severe rash involving mucus membranes or skin necrosis: No Has patient had a PCN reaction that required hospitalization: No Has patient had a PCN reaction occurring within the last 10 years: No If all of the above answers are "NO", then may proceed with Cephalosporin use.   Doxycycline Hives and Rash   Latex Rash and Other (See Comments)   Meloxicam Itching and Other (See Comments)    Induces mania Interacts with Lithium   Naproxen Hives and Other (See Comments)    Interacts with lithium   Pramipexole Hives and Rash   Prednisone Other (See Comments)    Interacts with Lithium   Risperidone Hives and Rash   Sulfa Antibiotics Rash    "that leaves scarring"   Tape Itching and Rash    Steri-Strips   Cortisone Other (See Comments)    Exacerbates the mania of her bipolar   Percocet [Oxycodone-Acetaminophen] Other (See Comments)    Severe dizziness    Medication list has been reviewed and updated.  Current Outpatient Medications on File Prior to Visit  Medication Sig Dispense Refill   acetaminophen (TYLENOL) 500 MG tablet Take 1,000 mg by mouth every 6 (six) hours as needed for mild pain.     albuterol (PROVENTIL HFA;VENTOLIN HFA) 108 (90 Base) MCG/ACT inhaler Inhale 1-2 puffs into the lungs every 6 (six) hours as needed for wheezing or shortness of breath. 1 each 3   azithromycin (ZITHROMAX) 250 MG tablet Take 1 tablet (250 mg total) by mouth daily. Take first 2 tablets together, then 1 every day until finished. 6 tablet 0   diphenhydrAMINE (BENADRYL ALLERGY) 25 mg capsule Take 25 mg by mouth every 6 (six) hours as needed.     EPINEPHrine 0.3 mg/0.3 mL IJ SOAJ injection Inject 0.3 mg into the muscle as needed (allergic reaction).      famciclovir  (FAMVIR) 500 MG tablet Take 1 tablet (500 mg total) by mouth daily. 90 tablet 3   fluticasone furoate-vilanterol (BREO ELLIPTA) 100-25 MCG/INH AEPB Inhale 1 puff into the lungs daily. 1 each 5   HYDROcodone-acetaminophen (NORCO/VICODIN) 5-325 MG tablet Take 1-2 tablets by mouth every 6 (six) hours as needed for moderate pain or severe pain. 15 tablet 0   hyoscyamine (LEVSIN, ANASPAZ) 0.125 MG tablet Take 1 tablet (0.125 mg total) by mouth as needed for cramping. Reported on 09/24/2015 10 tablet 2   LORazepam (ATIVAN) 0.5 MG tablet Take 1 tablet (  0.5 mg total) by mouth every 12 (twelve) hours as needed for anxiety. 30 tablet 1   rizatriptan (MAXALT-MLT) 10 MG disintegrating tablet Take 1 tablet (10 mg total) by mouth as needed for migraine. May repeat in 2 hours if needed 9 tablet 11   topiramate (TOPAMAX) 50 MG tablet Take 1 tablet (50 mg total) by mouth 2 (two) times daily. 60 tablet 12   UNABLE TO FIND Take by mouth. ELDERBERRY FRUIT ORAL     No current facility-administered medications on file prior to visit.     Review of Systems:  As per HPI- otherwise negative. No fever or chills Continues to have chronic malaise- no real change here  Physical Examination: Vitals:   02/16/19 1100  BP: 112/80  Pulse: 80  Resp: 16  Temp: 98.5 F (36.9 C)   Vitals:   02/16/19 1100  Weight: 173 lb (78.5 kg)  Height: 5' 3.5" (1.613 m)   Body mass index is 30.16 kg/m. Ideal Body Weight: Weight in (lb) to have BMI = 25: 143.1  GEN: WDWN, NAD, Non-toxic, A & O x 3, looks well, her normal self  HEENT: Atraumatic, Normocephalic. Neck supple. No masses, No LAD. Ears and Nose: No external deformity. CV: RRR, No M/G/R. No JVD. No thrill. No extra heart sounds. PULM: CTA B, no wheezes, crackles, rhonchi. No retractions. No resp. distress. No accessory muscle use. ABD: S, NT, ND, +BS. No rebound. No HSM.  Belly is benign at this time She appears to have healing shingles rash on her right trunk-  I do not appreciate any new vesicles at this time  EXTR: No c/c/e NEURO Normal gait.  PSYCH: Normally interactive. Conversant. Not depressed or anxious appearing.  Calm demeanor.    Assessment and Plan:   ICD-10-CM   1. Thyroid disorder  E07.9 TSH    T3, free  2. CLL (chronic lymphocytic leukemia) (HCC)  C91.10   3. Herpes zoster without complication  U23.5   4. Screening for diabetes mellitus  Z13.1 Hemoglobin A1c  5. Elevated lipase  R74.8 Lipase   Following up from the ER today She was seen with various sx, only real pathology noted was slightly high lipase.  Will repeat this for her today to follow trend Check thyroid labs today A1c pending  Continue to follow with hematology for her CLL   Asked her to let me know if any change or worsening of her sx. Jeri suffers from mild baseline malaise which I suspect is due to her CLL- she tends to be looking for another answer.   Follow-up: No follow-ups on file.  No orders of the defined types were placed in this encounter.  Orders Placed This Encounter  Procedures   TSH   Hemoglobin A1c   Lipase   T3, free    @SIGN @    Signed Lamar Blinks, MD  Received her labs, message to patient  Results for orders placed or performed in visit on 02/16/19  TSH  Result Value Ref Range   TSH 4.99 (H) 0.35 - 4.50 uIU/mL  Hemoglobin A1c  Result Value Ref Range   Hgb A1c MFr Bld 5.5 4.6 - 6.5 %  Lipase  Result Value Ref Range   Lipase 64.0 (H) 11.0 - 59.0 U/L  T3, free  Result Value Ref Range   T3, Free 2.9 2.3 - 4.2 pg/mL    Your TSH is elevated, although your T3 is still normal.  Typically the TSH would be the first abnormality seen  in thyroid disorder Can you remind me, when is the last time you are on thyroid medication?  We may want to restart a low-dose  A1c is normal, no sign of diabetes Your lipase-test for pancreatitis-is still minimally elevated It is actually slightly higher than when you were in the ER,  but the variation is minimal  It is possible you are still having some pancreatic inflammation I would suggest that you try a very light, fat-free if possible diet for the next several days-this will allow your pancreas to calm down  Let us plan to repeat a lipase for you next week, I will order this lab; please let me know if you are doing worse or have other concerns over the weekend

## 2019-02-16 NOTE — Telephone Encounter (Signed)
She needs a lab only appointment per dr. Lorelei Pont

## 2019-02-16 NOTE — Addendum Note (Signed)
Addended by: Lamar Blinks C on: 02/16/2019 08:36 PM   Modules accepted: Orders

## 2019-02-17 MED ORDER — LEVOTHYROXINE SODIUM 25 MCG PO TABS: 25.0000 ug | ORAL_TABLET | Freq: Every day | ORAL | 5 refills | Status: DC

## 2019-02-17 NOTE — Telephone Encounter (Signed)
First available for patient was the 12th. Scheduled appointment for labs.

## 2019-02-23 ENCOUNTER — Ambulatory Visit (INDEPENDENT_AMBULATORY_CARE_PROVIDER_SITE_OTHER): Payer: Medicare Other | Admitting: Psychiatry

## 2019-02-23 DIAGNOSIS — F3173 Bipolar disorder, in partial remission, most recent episode manic: Secondary | ICD-10-CM | POA: Diagnosis not present

## 2019-02-23 DIAGNOSIS — F4310 Post-traumatic stress disorder, unspecified: Secondary | ICD-10-CM

## 2019-02-23 DIAGNOSIS — B0229 Other postherpetic nervous system involvement: Secondary | ICD-10-CM | POA: Diagnosis not present

## 2019-02-23 DIAGNOSIS — B029 Zoster without complications: Secondary | ICD-10-CM | POA: Diagnosis not present

## 2019-02-23 DIAGNOSIS — Z856 Personal history of leukemia: Secondary | ICD-10-CM | POA: Diagnosis not present

## 2019-02-25 ENCOUNTER — Encounter: Payer: Self-pay | Admitting: Family Medicine

## 2019-02-26 ENCOUNTER — Emergency Department (HOSPITAL_BASED_OUTPATIENT_CLINIC_OR_DEPARTMENT_OTHER)
Admission: EM | Admit: 2019-02-26 | Discharge: 2019-02-27 | Disposition: A | Discharge status: Home / Self Care | Payer: Medicare Other | Attending: Emergency Medicine | Admitting: Emergency Medicine

## 2019-02-26 ENCOUNTER — Encounter (HOSPITAL_BASED_OUTPATIENT_CLINIC_OR_DEPARTMENT_OTHER): Payer: Self-pay | Admitting: Emergency Medicine

## 2019-02-26 ENCOUNTER — Other Ambulatory Visit: Payer: Self-pay

## 2019-02-26 DIAGNOSIS — Z87891 Personal history of nicotine dependence: Secondary | ICD-10-CM | POA: Insufficient documentation

## 2019-02-26 DIAGNOSIS — Z79899 Other long term (current) drug therapy: Secondary | ICD-10-CM | POA: Diagnosis not present

## 2019-02-26 DIAGNOSIS — L03113 Cellulitis of right upper limb: Secondary | ICD-10-CM | POA: Diagnosis not present

## 2019-02-26 DIAGNOSIS — L03114 Cellulitis of left upper limb: Secondary | ICD-10-CM | POA: Diagnosis not present

## 2019-02-26 DIAGNOSIS — Z91040 Latex allergy status: Secondary | ICD-10-CM | POA: Diagnosis not present

## 2019-02-26 DIAGNOSIS — E039 Hypothyroidism, unspecified: Secondary | ICD-10-CM | POA: Insufficient documentation

## 2019-02-26 DIAGNOSIS — J45909 Unspecified asthma, uncomplicated: Secondary | ICD-10-CM | POA: Insufficient documentation

## 2019-02-26 DIAGNOSIS — T50Z95A Adverse effect of other vaccines and biological substances, initial encounter: Secondary | ICD-10-CM | POA: Diagnosis not present

## 2019-02-26 DIAGNOSIS — R509 Fever, unspecified: Secondary | ICD-10-CM | POA: Diagnosis present

## 2019-02-26 LAB — BASIC METABOLIC PANEL
Anion gap: 12 (ref 5–15)
BUN: 17 mg/dL (ref 6–20)
CO2: 23 mmol/L (ref 22–32)
Calcium: 9.6 mg/dL (ref 8.9–10.3)
Chloride: 104 mmol/L (ref 98–111)
Creatinine, Ser: 0.87 mg/dL (ref 0.44–1.00)
GFR calc Af Amer: >60 mL/min (ref >60)
GFR calc non Af Amer: >60 mL/min (ref >60)
Glucose, Bld: 110 mg/dL — ABNORMAL HIGH (ref 70–99)
Potassium: 3.5 mmol/L (ref 3.5–5.1)
Sodium: 139 mmol/L (ref 135–145)

## 2019-02-26 MED ORDER — FAMOTIDINE IN NACL 20-0.9 MG/50ML-% IV SOLN
20.0000 mg | Freq: Once | INTRAVENOUS | Status: AC
  Administered 2019-02-26: 20 mg via INTRAVENOUS
  Filled 2019-02-26: qty 50

## 2019-02-26 MED ORDER — DIPHENHYDRAMINE HCL 50 MG/ML IJ SOLN
25.0000 mg | Freq: Once | INTRAMUSCULAR | Status: AC
  Administered 2019-02-26: 25 mg via INTRAVENOUS
  Filled 2019-02-26: qty 1

## 2019-02-26 NOTE — ED Triage Notes (Addendum)
Reports L arm pain and swelling, fever TMAX 100.8 since recieving shingles vaccine on 8/6 at PCP office. Reports itching and hives radiating to chest/mouth. Hx leukemia.

## 2019-02-26 NOTE — ED Notes (Signed)
Unsuccessful IV attempt x2.  

## 2019-02-27 ENCOUNTER — Encounter: Payer: Self-pay | Admitting: Family Medicine

## 2019-02-27 ENCOUNTER — Ambulatory Visit: Payer: Medicare Other | Admitting: Family Medicine

## 2019-02-27 ENCOUNTER — Telehealth: Payer: Self-pay | Admitting: Family Medicine

## 2019-02-27 ENCOUNTER — Other Ambulatory Visit: Payer: Self-pay

## 2019-02-27 DIAGNOSIS — K859 Acute pancreatitis without necrosis or infection, unspecified: Secondary | ICD-10-CM

## 2019-02-27 LAB — CBC WITH DIFFERENTIAL/PLATELET
Abs Immature Granulocytes: 0.05 10*3/uL (ref 0.00–0.07)
Basophils Absolute: 0.1 10*3/uL (ref 0.0–0.1)
Basophils Relative: 1 %
Eosinophils Absolute: 0.7 10*3/uL — ABNORMAL HIGH (ref 0.0–0.5)
Eosinophils Relative: 5 %
HCT: 45.8 % (ref 36.0–46.0)
Hemoglobin: 14.7 g/dL (ref 12.0–15.0)
Immature Granulocytes: 0 %
Lymphocytes Relative: 45 %
Lymphs Abs: 5.8 10*3/uL — ABNORMAL HIGH (ref 0.7–4.0)
MCH: 28.1 pg (ref 26.0–34.0)
MCHC: 32.1 g/dL (ref 30.0–36.0)
MCV: 87.4 fL (ref 80.0–100.0)
Monocytes Absolute: 2.1 10*3/uL — ABNORMAL HIGH (ref 0.1–1.0)
Monocytes Relative: 17 %
Neutro Abs: 4.2 10*3/uL (ref 1.7–7.7)
Neutrophils Relative %: 32 %
Platelets: 207 10*3/uL (ref 150–400)
RBC: 5.24 MIL/uL — ABNORMAL HIGH (ref 3.87–5.11)
RDW: 13.4 % (ref 11.5–15.5)
Smear Review: NORMAL
WBC Morphology: >10
WBC: 12.9 10*3/uL — ABNORMAL HIGH (ref 4.0–10.5)
nRBC: 0 % (ref 0.0–0.2)

## 2019-02-27 MED ORDER — DIPHENHYDRAMINE HCL 25 MG PO TABS: 25.0000 mg | ORAL_TABLET | Freq: Four times a day (QID) | ORAL | 0 refills | Status: DC | PRN

## 2019-02-27 MED ORDER — FAMOTIDINE 20 MG PO TABS: 20.0000 mg | ORAL_TABLET | Freq: Two times a day (BID) | ORAL | 0 refills | Status: DC

## 2019-02-27 MED ORDER — CEPHALEXIN 500 MG PO CAPS: 500.0000 mg | ORAL_CAPSULE | Freq: Four times a day (QID) | ORAL | 0 refills | Status: DC

## 2019-02-27 MED ORDER — CEPHALEXIN 250 MG PO CAPS
500.0000 mg | ORAL_CAPSULE | Freq: Once | ORAL | Status: AC
  Administered 2019-02-27: 500 mg via ORAL
  Filled 2019-02-27: qty 2

## 2019-02-27 NOTE — [place] Notes (Addendum)
[facility] [place], [place], [place] [address] [phone]  Date:  03/01/2019   Name:  Morgan [name]   DOB:  09/09/1965   MRN:  [id number]  PCP:  Darreld Mclean, MD    Chief Complaint: ER follow up (vaccine reaction, burning, redness)   History of Present Illness:  Morgan [name] is a 53 y.o. very pleasant female patient who presents with the following:  Pt with history of CLL, mood disorder Seen in the emergency room on 8/9, with possible vaccine reaction- systemic sx and pain/ swelling of her LEFT arm where she had the vaccine  She had her first shingles vaccine 3 days prior, and began running a low-grade fever and had redness and swelling at the vaccine site-she was given Keflex, decided not to use prednisone due to her immunocompromise status  Her fevers are now resolved She feels like she is making progress -the redness in her arm is getting better.  However she has noticed some tenderness in her left axilla which is concerning to her.  Also the antibiotic seems to be giving her some nausea and upsetting her stomach.  She does not feel much like eating   She has to return to work tomorrow which is necessary to pay her bills but stressful to her  She does have disability, but also works at [employer].  Patient Active Problem List   Diagnosis Date Noted   Fever 06/18/2018   Sore throat 06/18/2018   URI (upper respiratory infection) 06/18/2018   Dysfunction of right eustachian tube 12/03/2017   Piriformis syndrome of right side 04/22/2017   Lateral epicondylitis of left elbow 04/06/2017   Solitary pulmonary nodule 10/29/2016   Chronic lymphocytic leukemia ([place]) 07/31/2016   Mass in neck 04/17/2016   Abdominal mass 04/17/2016   Asthma 12/12/2015   Multiple environmental allergies 12/12/2015   Chronic lower back pain 09/12/2015   Bipolar 1 disorder, depressed ([place]) 08/27/2015   Class: Chronic   Other specified hypothyroidism 08/23/2015   Borderline personality disorder ([place]) 08/15/2015   Severe benzodiazepine use disorder ([place]) 08/15/2015   Alcohol use disorder, moderate, dependence ([place]) 08/15/2015   PTSD (post-traumatic stress disorder) 08/15/2015   History of bipolar disorder 08/15/2015   Pre-syncope 05/17/2015   Skull deformity 05/17/2015   Patellofemoral syndrome of both knees 05/02/2015    Past Medical History:  Diagnosis Date   Anxiety    Arthritis    Asthma    Bipolar disorder ([place])    Bulging lumbar disc 07/19/05   Chronic headaches    Chronic lymphocytic leukemia ([place]) 07/31/2016   Colon polyps    Degenerative disorder of bone    Depression    History of borderline personality disorder    Hypothyroidism 2007   Developed after use of Lithium   IBS (irritable bowel syndrome) 1995   Pneumonia    Proctitis    Shingles 07/2018   Substance abuse ([place])    ativan last month    Past Surgical History:  Procedure Laterality Date   ABDOMINAL HYSTERECTOMY     APPENDECTOMY     BUNIONECTOMY Right 06/2012   CHOLECYSTECTOMY     OOPHORECTOMY     SHOULDER OPEN ROTATOR CUFF REPAIR Right 03/2010   TUBAL LIGATION      Social History   Tobacco Use   Smoking status: Former Smoker    Quit date: 07/20/1980    Years since quitting: 25.6  Smokeless tobacco: Never Used  Substance Use Topics   Alcohol use: No    Alcohol/week: 0.0 standard drinks    Comment: unsure   Drug use: Yes    Types: Benzodiazepines    Family History  Problem Relation Age of Onset   Depression Mother    Hypertension Mother    Post-traumatic stress disorder Sister    Alcohol abuse Brother    Drug abuse Brother    Post-traumatic stress disorder Brother    Thyroid disease Father    Alzheimer's disease Father    COPD Maternal Grandmother    Heart disease Maternal Grandfather    Arthritis Paternal Grandmother    Diabetes  Paternal Grandmother    Depression Paternal Grandmother    Alzheimer's disease Paternal Grandmother    Heart disease Paternal Grandfather    Coronary artery disease Paternal Grandfather    Alcohol abuse Paternal Grandfather    Colon cancer Neg Hx    Breast cancer Neg Hx     Allergies  Allergen Reactions   Aripiprazole Anaphylaxis and Swelling   Ciprofloxacin Shortness Of Breath   Lamotrigine Rash and Other (See Comments)   Nitrofurantoin Hives   Other Anaphylaxis, Hives and Swelling    Walnuts and pine nuts   Peanut Oil Anaphylaxis, Hives, Swelling and Rash   Amoxicillin Hives and Rash    Has patient had a PCN reaction causing immediate rash, facial/tongue/throat swelling, SOB or lightheadedness with hypotension: Yes Has patient had a PCN reaction causing severe rash involving mucus membranes or skin necrosis: No Has patient had a PCN reaction that required hospitalization: No Has patient had a PCN reaction occurring within the last 10 years: No If all of the above answers are "NO", then may proceed with Cephalosporin use.   Doxycycline Hives and Rash   Latex Rash and Other (See Comments)   Meloxicam Itching and Other (See Comments)    Induces mania Interacts with Lithium   Naproxen Hives and Other (See Comments)    Interacts with lithium   Pramipexole Hives and Rash   Prednisone Other (See Comments)    Interacts with Lithium   Risperidone Hives and Rash   Sulfa Antibiotics Rash    "that leaves scarring"   Tape Itching and Rash    Steri-Strips   Cortisone Other (See Comments)    Exacerbates the mania of her bipolar   Percocet [Oxycodone-Acetaminophen] Other (See Comments)    Severe dizziness   Shingrix [Zoster Vac Recomb Adjuvanted]     Medication list has been reviewed and updated.  Current Outpatient Medications on File Prior to Visit  Medication Sig Dispense Refill   acetaminophen (TYLENOL) 500 MG tablet Take 1,000 mg by mouth every 6  (six) hours as needed for mild pain.     albuterol (PROVENTIL HFA;VENTOLIN HFA) 108 (90 Base) MCG/ACT inhaler Inhale 1-2 puffs into the lungs every 6 (six) hours as needed for wheezing or shortness of breath. 1 each 3   cephALEXin (KEFLEX) 500 MG capsule Take 1 capsule (500 mg total) by mouth 4 (four) times daily. 20 capsule 0   diphenhydrAMINE (BENADRYL ALLERGY) 25 mg capsule Take 25 mg by mouth every 6 (six) hours as needed.     diphenhydrAMINE (BENADRYL) 25 MG tablet Take 1 tablet (25 mg total) by mouth every 6 (six) hours as needed for itching. 20 tablet 0   EPINEPHrine 0.3 mg/0.3 mL IJ SOAJ injection Inject 0.3 mg into the muscle as needed (allergic reaction).      famciclovir (  FAMVIR) 500 MG tablet Take 1 tablet (500 mg total) by mouth daily. 90 tablet 3   famotidine (PEPCID) 20 MG tablet Take 1 tablet (20 mg total) by mouth 2 (two) times daily. 30 tablet 0   fluticasone furoate-vilanterol (BREO ELLIPTA) 100-25 MCG/INH AEPB Inhale 1 puff into the lungs daily. 1 each 5   HYDROcodone-acetaminophen (NORCO/VICODIN) 5-325 MG tablet Take 1-2 tablets by mouth every 6 (six) hours as needed for moderate pain or severe pain. 15 tablet 0   hyoscyamine (LEVSIN, ANASPAZ) 0.125 MG tablet Take 1 tablet (0.125 mg total) by mouth as needed for cramping. Reported on 09/24/2015 10 tablet 2   levothyroxine (SYNTHROID) 25 MCG tablet Take 1 tablet (25 mcg total) by mouth daily before breakfast. 30 tablet 5   LORazepam (ATIVAN) 0.5 MG tablet Take 1 tablet (0.5 mg total) by mouth every 12 (twelve) hours as needed for anxiety. 30 tablet 1   rizatriptan (MAXALT-MLT) 10 MG disintegrating tablet Take 1 tablet (10 mg total) by mouth as needed for migraine. May repeat in 2 hours if needed 9 tablet 11   topiramate (TOPAMAX) 50 MG tablet Take 1 tablet (50 mg total) by mouth 2 (two) times daily. 60 tablet 12   UNABLE TO FIND Take by mouth. ELDERBERRY FRUIT ORAL     No current facility-administered medications on  file prior to visit.     Review of Systems:  As per HPI- otherwise negative.  No current fever or chills No vomiting Physical Examination: Vitals:   03/01/19 1316  BP: 128/80  Pulse: 68  Resp: 16  Temp: 97.7 F (36.5 C)  SpO2: 99%   Vitals:   03/01/19 1316  Weight: 174 lb (78.9 kg)  Height: 5' 3.5" (1.613 m)   Body mass index is 30.34 kg/m. Ideal Body Weight: Weight in (lb) to have BMI = 25: 143.1  GEN: WDWN, NAD, Non-toxic, A & O x 3, overweight, looks well HEENT: Atraumatic, Normocephalic. Neck supple. No masses, No LAD. Ears and Nose: No external deformity. CV: RRR, No M/G/R. No JVD. No thrill. No extra heart sounds. PULM: CTA B, no wheezes, crackles, rhonchi. No retractions. No resp. distress. No accessory muscle use. ABD: S, NT, ND EXTR: No c/c/e NEURO Normal gait.  PSYCH: Normally interactive. Conversant. Not depressed or anxious appearing.  Calm demeanor.  There is mild redness and erythema of the left upper arm.  The patient states this is improving.  The area is slightly tender.  She also has tender in the left axilla, though I do not palpate a discrete lymph node The left breast is normal, no masses or dimpling   Assessment and Plan:   ICD-10-CM   1. Hospital discharge follow-up  Z09   2. Elevated lipase  R74.8 Lipase  3. Thyroid disorder  E07.9 TSH  4. Adverse effect of vaccine, subsequent encounter  T50.Z95D    Following up today from recent ER visit for vaccine reaction.  Jenilyn the Shingrix vaccine, followed by a significant systemic reaction and also possible localized cellulitis.  She is getting better, though she is not quite back to normal.  She is currently taking Keflex 4 times a day.  Suggested that she cut back to twice a day, which may be easier on her stomach.  Encouraged her current plenty of fluids and eat a bland diet as tolerated. At this juncture she does not plan to start on prednisone, I think this is reasonable as her reaction is  resolving  She will continue  to keep me posted on her progress  Follow-up: No follow-ups on file.  No orders of the defined types were placed in this encounter.  No orders of the defined types were placed in this encounter.   @SIGN @  Advised not to have the 2nd dose of shingrix  Signed Lamar Blinks, MD  Called pt to go over her labs fromtoday Results for orders placed or performed in visit on 03/01/19  Lipase  Result Value Ref Range   Lipase 187.0 (H) 11.0 - 59.0 U/L  TSH  Result Value Ref Range   TSH 2.80 0.35 - 4.50 uIU/mL   lipase has gone up and is now 3x normal She may have pancreatitis Went over pancreatitis diet We talked about getting a Ct scan- just done about 2 weeks ago Will touch base with her tomorrow and see how she is doing. Go from there

## 2019-02-27 NOTE — ED Provider Notes (Signed)
[facility] EMERGENCY DEPARTMENT Provider Note   CSN: [id number] Arrival date & time: 02/26/19  2242    History   Chief Complaint Chief Complaint  Patient presents with  . vaccine reaction    HPI Morgan [name] is a 53 y.o. female.     HPI  This is a 53 year old female with a history of CLL, bipolar disorder, chronic headaches, hypothyroidism, IBS who presents with possible vaccine reaction.  Patient reports that she received the shingles vaccine in her left arm on Thursday evening.  Since that time she has had low-grade fevers at home.  Reports T-max 100.8.  She has had redness and swelling of the left arm.  She also reports it is significantly itchy.  She has also noted rash and what she thinks is hives over her chest coming into her neck.  Tonight she developed some sore throat and that concerned her.  She has not taken anything for her symptoms.  She denies any sick contacts..  Denies any shortness of breath, chest pain, abdominal pain, diarrhea.  Past Medical History:  Diagnosis Date  . Anxiety   . Arthritis   . Asthma   . Bipolar disorder ([place])   . Bulging lumbar disc 07/19/05  . Chronic headaches   . Chronic lymphocytic leukemia ([place]) 07/31/2016  . Colon polyps   . Degenerative disorder of bone   . Depression   . History of borderline personality disorder   . Hypothyroidism 2007   Developed after use of Lithium  . IBS (irritable bowel syndrome) 1995  . Pneumonia   . Proctitis   . Shingles 07/2018  . Substance abuse ([place])    ativan last month    Patient Active Problem List   Diagnosis Date Noted  . Fever 06/18/2018  . Sore throat 06/18/2018  . URI (upper respiratory infection) 06/18/2018  . Dysfunction of right eustachian tube 12/03/2017  . Piriformis syndrome of right side 04/22/2017  . Lateral epicondylitis of left elbow 04/06/2017  . Solitary pulmonary nodule 10/29/2016  . Chronic lymphocytic leukemia ([place]) 07/31/2016  . Mass in neck  04/17/2016  . Abdominal mass 04/17/2016  . Asthma 12/12/2015  . Multiple environmental allergies 12/12/2015  . Chronic lower back pain 09/12/2015  . Bipolar 1 disorder, depressed ([place]) 08/27/2015    Class: Chronic  . Other specified hypothyroidism 08/23/2015  . Borderline personality disorder ([place]) 08/15/2015  . Severe benzodiazepine use disorder ([place]) 08/15/2015  . Alcohol use disorder, moderate, dependence ([place]) 08/15/2015  . PTSD (post-traumatic stress disorder) 08/15/2015  . History of bipolar disorder 08/15/2015  . Pre-syncope 05/17/2015  . Skull deformity 05/17/2015  . Patellofemoral syndrome of both knees 05/02/2015    Past Surgical History:  Procedure Laterality Date  . ABDOMINAL HYSTERECTOMY    . APPENDECTOMY    . BUNIONECTOMY Right 06/2012  . CHOLECYSTECTOMY    . OOPHORECTOMY    . SHOULDER OPEN ROTATOR CUFF REPAIR Right 03/2010  . TUBAL LIGATION       OB History    Gravida  2   Para  2   Term  2   Preterm  0   AB  0   Living  2     SAB  0   TAB  0   Ectopic  0   Multiple  0   Live Births               Home Medications    Prior to Admission medications   Medication Sig Start Date  End Date Taking? Authorizing Provider  acetaminophen (TYLENOL) 500 MG tablet Take 1,000 mg by mouth every 6 (six) hours as needed for mild pain.    [provider]  albuterol (PROVENTIL HFA;VENTOLIN HFA) 108 (90 Base) MCG/ACT inhaler Inhale 1-2 puffs into the lungs every 6 (six) hours as needed for wheezing or shortness of breath. 06/17/18   Binnie Rail, MD  cephALEXin (KEFLEX) 500 MG capsule Take 1 capsule (500 mg total) by mouth 4 (four) times daily. 02/27/19   Horton, Barbette Hair, MD  diphenhydrAMINE (BENADRYL ALLERGY) 25 mg capsule Take 25 mg by mouth every 6 (six) hours as needed.    [provider]  diphenhydrAMINE (BENADRYL) 25 MG tablet Take 1 tablet (25 mg total) by mouth every 6 (six) hours as needed for itching. 02/27/19   Horton,  Barbette Hair, MD  EPINEPHrine 0.3 mg/0.3 mL IJ SOAJ injection Inject 0.3 mg into the muscle as needed (allergic reaction).  05/24/17   [provider]  famciclovir (FAMVIR) 500 MG tablet Take 1 tablet (500 mg total) by mouth daily. 10/12/18   Volanda Napoleon, MD  famotidine (PEPCID) 20 MG tablet Take 1 tablet (20 mg total) by mouth 2 (two) times daily. 02/27/19   Horton, Barbette Hair, MD  fluticasone furoate-vilanterol (BREO ELLIPTA) 100-25 MCG/INH AEPB Inhale 1 puff into the lungs daily. 06/17/18   Binnie Rail, MD  HYDROcodone-acetaminophen (NORCO/VICODIN) 5-325 MG tablet Take 1-2 tablets by mouth every 6 (six) hours as needed for moderate pain or severe pain. 02/10/19   Charlesetta Shanks, MD  hyoscyamine (LEVSIN, ANASPAZ) 0.125 MG tablet Take 1 tablet (0.125 mg total) by mouth as needed for cramping. Reported on 09/24/2015 07/05/17   Copland, Gay Filler, MD  levothyroxine (SYNTHROID) 25 MCG tablet Take 1 tablet (25 mcg total) by mouth daily before breakfast. 02/17/19   Copland, Gay Filler, MD  LORazepam (ATIVAN) 0.5 MG tablet Take 1 tablet (0.5 mg total) by mouth every 12 (twelve) hours as needed for anxiety. 12/09/18   Copland, Gay Filler, MD  rizatriptan (MAXALT-MLT) 10 MG disintegrating tablet Take 1 tablet (10 mg total) by mouth as needed for migraine. May repeat in 2 hours if needed 05/23/18   Penumalli, Earlean Polka, MD  topiramate (TOPAMAX) 50 MG tablet Take 1 tablet (50 mg total) by mouth 2 (two) times daily. 05/23/18   Penumalli, Earlean Polka, MD  UNABLE TO FIND Take by mouth. ELDERBERRY FRUIT ORAL    [provider]    Family History Family History  Problem Relation Age of Onset  . Depression Mother   . Hypertension Mother   . Post-traumatic stress disorder Sister   . Alcohol abuse Brother   . Drug abuse Brother   . Post-traumatic stress disorder Brother   . Thyroid disease Father   . Alzheimer's disease Father   . COPD Maternal Grandmother   . Heart disease Maternal Grandfather   .  Arthritis Paternal Grandmother   . Diabetes Paternal Grandmother   . Depression Paternal Grandmother   . Alzheimer's disease Paternal Grandmother   . Heart disease Paternal Grandfather   . Coronary artery disease Paternal Grandfather   . Alcohol abuse Paternal Grandfather   . Colon cancer Neg Hx   . Breast cancer Neg Hx     Social History Social History   Tobacco Use  . Smoking status: Former Smoker    Quit date: 07/20/1980    Years since quitting: 38.6  . Smokeless tobacco: Never Used  Substance Use Topics  .  Alcohol use: No    Alcohol/week: 0.0 standard drinks    Comment: unsure  . Drug use: Yes    Types: Benzodiazepines     Allergies   Aripiprazole, Ciprofloxacin, Lamotrigine, Nitrofurantoin, Other, Peanut oil, Amoxicillin, Doxycycline, Latex, Meloxicam, Naproxen, Pramipexole, Prednisone, Risperidone, Sulfa antibiotics, Tape, Cortisone, and Percocet [oxycodone-acetaminophen]   Review of Systems Review of Systems  Constitutional: Positive for fever.  HENT: Positive for sore throat. Negative for trouble swallowing.   Respiratory: Negative for shortness of breath.   Cardiovascular: Negative for chest pain.  Gastrointestinal: Positive for nausea. Negative for abdominal pain and vomiting.  Genitourinary: Negative for dysuria.  Skin: Positive for color change and rash.  All other systems reviewed and are negative.    Physical Exam Updated Vital Signs BP (!) 145/90 (BP Location: Right Arm)   Pulse 84   Temp 98.1 F (36.7 C) (Oral)   Resp 18   Ht 1.613 m (5' 3.5")   Wt 79.8 kg   SpO2 100%   BMI 30.69 kg/m   Physical Exam Vitals signs and nursing note reviewed.  Constitutional:      Appearance: She is well-developed.  HENT:     Head: Normocephalic and atraumatic.     Mouth/Throat:     Mouth: Mucous membranes are moist.     Pharynx: No posterior oropharyngeal erythema.     Comments: No swelling noted Eyes:     Pupils: Pupils are equal, round, and reactive  to light.  Neck:     Musculoskeletal: Neck supple.  Cardiovascular:     Rate and Rhythm: Normal rate and regular rhythm.     Heart sounds: Normal heart sounds.  Pulmonary:     Effort: Pulmonary effort is normal. No respiratory distress.     Breath sounds: No wheezing.  Abdominal:     General: Bowel sounds are normal.     Palpations: Abdomen is soft.     Tenderness: There is no guarding or rebound.  Lymphadenopathy:     Cervical: Cervical adenopathy present.  Skin:    General: Skin is warm and dry.     Comments: Splotchy erythema over the chest, no raised lesions noted, redness and warmth of the left arm involving most of the proximal arm, there is tenderness to palpation, no fluctuance  Neurological:     Mental Status: She is alert and oriented to person, place, and time.  Psychiatric:        Mood and Affect: Mood normal.      ED Treatments / Results  Labs (all labs ordered are listed, but only abnormal results are displayed) Labs Reviewed  CBC WITH DIFFERENTIAL/PLATELET - Abnormal; Notable for the following components:      Result Value   WBC 12.9 (*)    RBC 5.24 (*)    Lymphs Abs 5.8 (*)    Monocytes Absolute 2.1 (*)    Eosinophils Absolute 0.7 (*)    All other components within normal limits  BASIC METABOLIC PANEL - Abnormal; Notable for the following components:   Glucose, Bld 110 (*)    All other components within normal limits    EKG None  Radiology No results found.  Procedures Procedures (including critical care time)  Medications Ordered in ED Medications  famotidine (PEPCID) IVPB 20 mg premix (0 mg Intravenous Stopped 02/27/19 0016)  diphenhydrAMINE (BENADRYL) injection 25 mg (25 mg Intravenous Given 02/26/19 2341)  cephALEXin (KEFLEX) capsule 500 mg (500 mg Oral Given 02/27/19 0039)     Initial Impression /  Assessment and Plan / ED Course  I have reviewed the triage vital signs and the nursing notes.  Pertinent labs & imaging results that were  available during my care of the patient were reviewed by me and considered in my medical decision making (see chart for details).        Patient presents with concerns for an adverse reaction to her shingles vaccine.  She is overall nontoxic-appearing and vital signs are reassuring.  She is afebrile at this time.  She does report fevers at home.  She has a significant erythematous and warm rash close to the injection site.  This looks more significant than just a simple local injection site reaction.  Would have some concern for cellulitis given fevers.  She also has some erythema over her chest which is splotchy.  It does not appear to be hives.  I did obtain some lab work given her history of leukemia.  She was given Pepcid and Benadryl.  Held off on prednisone given immunosuppressive effects.  She does have a leukocytosis today to 12.  This is changed from her prior.  Would elect to treat her cellulitis.  She has significant antibiotic allergies.  These include penicillins and Bactrim.  She also has an allergy to doxycycline.  For this reason, she was given 1 dose of Keflex in the emergency room without any ill effects.  I discussed with the patient treating her for possible allergic response given the blotchy rash over her chest as it may relate to the vaccine administration.  No indication for epinephrine at this time.  She did have some improvement with Pepcid and Benadryl.  I also suspect she has cellulitis.  She will be treated with Keflex.  We will hold off on prednisone at this time.  Recommend close follow-up with her primary physician.  After history, exam, and medical workup I feel the patient has been appropriately medically screened and is safe for discharge home. Pertinent diagnoses were discussed with the patient. Patient was given return precautions.   Final Clinical Impressions(s) / ED Diagnoses   Final diagnoses:  Left arm cellulitis  Adverse effect of vaccine, initial encounter     ED Discharge Orders         Ordered    famotidine (PEPCID) 20 MG tablet  2 times daily     02/27/19 0111    diphenhydrAMINE (BENADRYL) 25 MG tablet  Every 6 hours PRN     02/27/19 0111    cephALEXin (KEFLEX) 500 MG capsule  4 times daily     02/27/19 0111           Horton, Barbette Hair, MD 02/27/19 [phone]

## 2019-02-27 NOTE — Telephone Encounter (Signed)
Handled.   

## 2019-02-27 NOTE — Telephone Encounter (Signed)
Pt states she needs Dr. Lorelei Pont to let her know if she should be on prednisone based on ER d/c instructions. Pt states ok to call or send thru mychart.  "Your seen today for possible reaction to the shingles vaccine. You likely have a skin infection which is called cellulitis. You will be given Keflex. Regarding your additional rash, this may be related to a minor vaccine reaction. Take Pepcid and Benadryl as prescribed. You were not given prednisone at this time given that it would suppress your immune system and potentially affect the treatment for your cellulitis. Follow-up closely with your primary doctor."

## 2019-02-27 NOTE — Discharge Instructions (Addendum)
Your seen today for possible reaction to the shingles vaccine.  You likely have a skin infection which is called cellulitis.  You will be given Keflex.  Regarding your additional rash, this may be related to a minor vaccine reaction.  Take Pepcid and Benadryl as prescribed.  You were not given prednisone at this time given that it would suppress your immune system and potentially affect the treatment for your cellulitis.  Follow-up closely with your primary doctor.

## 2019-02-28 ENCOUNTER — Encounter: Payer: Self-pay | Admitting: General Practice

## 2019-02-28 ENCOUNTER — Ambulatory Visit (INDEPENDENT_AMBULATORY_CARE_PROVIDER_SITE_OTHER): Payer: Medicare Other | Admitting: Psychiatry

## 2019-02-28 DIAGNOSIS — F4310 Post-traumatic stress disorder, unspecified: Secondary | ICD-10-CM | POA: Diagnosis not present

## 2019-02-28 DIAGNOSIS — F3173 Bipolar disorder, in partial remission, most recent episode manic: Secondary | ICD-10-CM

## 2019-02-28 NOTE — [place] Notes (Signed)
[name]ual [facility] Note  Had Spiritual Care WebEx meeting as planned, including meditative creative pursuits per pt request. She is working hard to process the physical and emotional challenge of a severe allergic reaction to the shingles vaccine. Today she was tired but overall positive and taking one step at a time. This very present perspective is a coping tool that is helping mitigate anxiety and fear about implications. Morgan [name] plans to f/u with chaplain as needed.   [place], [place], [facility] Pager [phone] Voicemail [phone]

## 2019-03-01 ENCOUNTER — Encounter: Payer: Self-pay | Admitting: Family Medicine

## 2019-03-01 ENCOUNTER — Other Ambulatory Visit: Payer: Self-pay

## 2019-03-01 ENCOUNTER — Other Ambulatory Visit: Payer: Medicare Other

## 2019-03-01 ENCOUNTER — Ambulatory Visit (INDEPENDENT_AMBULATORY_CARE_PROVIDER_SITE_OTHER): Payer: Medicare Other | Admitting: Family Medicine

## 2019-03-01 VITALS — BP 128/80 | HR 68 | Temp 97.7°F | Resp 16 | Ht 63.5 in | Wt 174.0 lb

## 2019-03-01 DIAGNOSIS — T50Z95D Adverse effect of other vaccines and biological substances, subsequent encounter: Secondary | ICD-10-CM

## 2019-03-01 DIAGNOSIS — Z09 Encounter for follow-up examination after completed treatment for conditions other than malignant neoplasm: Secondary | ICD-10-CM | POA: Diagnosis not present

## 2019-03-01 DIAGNOSIS — E079 Disorder of thyroid, unspecified: Secondary | ICD-10-CM

## 2019-03-01 DIAGNOSIS — R748 Abnormal levels of other serum enzymes: Secondary | ICD-10-CM

## 2019-03-01 LAB — TSH: TSH: 2.8 u[IU]/mL (ref 0.35–4.50)

## 2019-03-01 LAB — LIPASE: Lipase: 187 U/L — ABNORMAL HIGH (ref 11.0–59.0)

## 2019-03-01 NOTE — Patient Instructions (Signed)
It was good to see you today but I am sorry that you had this vaccine reaction.  I will cc your oncologist on our note from today [place] to decrease your antibiotic to one pill twice a day for 2-3 more days now Try eating a light and bland diet  Your arm does seem to be getting better!  Let me know if you don't continue to go in the right direction here JC

## 2019-03-02 NOTE — Telephone Encounter (Signed)
Called and spoke with her today  She is about the same, no worse Sticking to a soft and bland diet Ordered CBC, CMP, lipase for her to have done tomorrow am If lipase worsening will need to think about a CT scan

## 2019-03-03 ENCOUNTER — Other Ambulatory Visit (INDEPENDENT_AMBULATORY_CARE_PROVIDER_SITE_OTHER): Payer: Medicare Other

## 2019-03-03 ENCOUNTER — Encounter: Payer: Self-pay | Admitting: Family Medicine

## 2019-03-03 DIAGNOSIS — K859 Acute pancreatitis without necrosis or infection, unspecified: Secondary | ICD-10-CM | POA: Diagnosis not present

## 2019-03-03 DIAGNOSIS — R748 Abnormal levels of other serum enzymes: Secondary | ICD-10-CM

## 2019-03-03 LAB — CBC
HCT: 42.1 % (ref 36.0–46.0)
Hemoglobin: 14 g/dL (ref 12.0–15.0)
MCHC: 33.2 g/dL (ref 30.0–36.0)
MCV: 86.3 fl (ref 78.0–100.0)
Platelets: 216 10*3/uL (ref 150.0–400.0)
RBC: 4.88 Mil/uL (ref 3.87–5.11)
RDW: 13.4 % (ref 11.5–15.5)
WBC: 8.3 10*3/uL (ref 4.0–10.5)

## 2019-03-03 LAB — COMPREHENSIVE METABOLIC PANEL
ALT: 12 U/L (ref 0–35)
AST: 18 U/L (ref 0–37)
Albumin: 4.3 g/dL (ref 3.5–5.2)
Alkaline Phosphatase: 61 U/L (ref 39–117)
BUN: 10 mg/dL (ref 6–23)
CO2: 28 mEq/L (ref 19–32)
Calcium: 9.5 mg/dL (ref 8.4–10.5)
Chloride: 102 mEq/L (ref 96–112)
Creatinine, Ser: 0.89 mg/dL (ref 0.40–1.20)
GFR: 66.05 mL/min (ref >60.00)
Glucose, Bld: 77 mg/dL (ref 70–99)
Potassium: 4.2 mEq/L (ref 3.5–5.1)
Sodium: 138 mEq/L (ref 135–145)
Total Bilirubin: 0.4 mg/dL (ref 0.2–1.2)
Total Protein: 6.5 g/dL (ref 6.0–8.3)

## 2019-03-03 LAB — LIPASE: Lipase: 128 U/L — ABNORMAL HIGH (ref 11.0–59.0)

## 2019-03-08 ENCOUNTER — Other Ambulatory Visit: Payer: Self-pay

## 2019-03-08 ENCOUNTER — Other Ambulatory Visit (INDEPENDENT_AMBULATORY_CARE_PROVIDER_SITE_OTHER): Payer: Medicare Other

## 2019-03-08 DIAGNOSIS — R748 Abnormal levels of other serum enzymes: Secondary | ICD-10-CM

## 2019-03-09 ENCOUNTER — Encounter: Payer: Self-pay | Admitting: Family Medicine

## 2019-03-09 ENCOUNTER — Ambulatory Visit (INDEPENDENT_AMBULATORY_CARE_PROVIDER_SITE_OTHER): Payer: Medicare Other | Admitting: Psychiatry

## 2019-03-09 ENCOUNTER — Encounter: Payer: Self-pay | Admitting: General Practice

## 2019-03-09 DIAGNOSIS — F4310 Post-traumatic stress disorder, unspecified: Secondary | ICD-10-CM

## 2019-03-09 DIAGNOSIS — F3173 Bipolar disorder, in partial remission, most recent episode manic: Secondary | ICD-10-CM

## 2019-03-09 LAB — LIPASE: Lipase: 121 U/L — ABNORMAL HIGH (ref 11.0–59.0)

## 2019-03-09 NOTE — Progress Notes (Signed)
[place] Spiritual Care Note  Had double session with Jeidy by [employer] this afternoon because she is processing significant realizations and plans for self-care that arose in her counseling appointment this morning. Provided pastoral presence, empathic listening, and emotional support. Joleigh plans to f/u with chaplain as needed.   [place], [place], [facility] Pager [phone] Voicemail [phone]

## 2019-03-14 ENCOUNTER — Encounter: Payer: Self-pay | Admitting: Family Medicine

## 2019-03-16 ENCOUNTER — Ambulatory Visit (INDEPENDENT_AMBULATORY_CARE_PROVIDER_SITE_OTHER): Payer: Medicare Other | Admitting: Psychiatry

## 2019-03-16 DIAGNOSIS — F4310 Post-traumatic stress disorder, unspecified: Secondary | ICD-10-CM

## 2019-03-16 DIAGNOSIS — F3173 Bipolar disorder, in partial remission, most recent episode manic: Secondary | ICD-10-CM | POA: Diagnosis not present

## 2019-03-18 NOTE — Progress Notes (Addendum)
[place] [facility] [place], [place], [place] [address] [phone]  Date:  03/20/2019   Name:  Morgan [name]   DOB:  Jan 27, 1966   MRN:  [id number]  PCP:  Darreld Mclean, MD    Chief Complaint: Rash (shingles? ), Fatigue, and Nausea (felt like she was gonna pass out)   History of Present Illness:  Morgan [name] is a 53 y.o. very pleasant female patient who presents with the following:  Here today for in office follow-up visit Kija has history of bipolar disorder, CLL, chronic shingles, pancreatitis She has recently struggled with mild pancreatitis, managed with dietary changes.  Would like to recheck her lipase today.  Last checked on 8/19, at that point it was trending down Her liver tests and CBC were normal at that time  Will give flu shot today  She had another episode of belly pain about 3 days ago, occurred at night and lasted for about an hour  She took a nausea med that she had on hand and a protonix and it did seem to help She was able to get back to sleep and this has not happened to her again She felt nauseated but did not vomit during this episode  The last couple of days she has been eating pretty normally, generally she feels better in the morning and in the middle of the day.  She is having more sx if she eats at night   Wt Readings from Last 3 Encounters:  03/20/19 178 lb (80.7 kg)  03/01/19 174 lb (78.9 kg)  02/26/19 176 lb (79.8 kg)   She did walk for exercise yesterday and did ok  She will be working a lot this week  Lab Results  Component Value Date   TSH 2.80 03/01/2019   She plans to do a smoothie detox for a few days-this is managed by nutritionist and sounds reasonable  She continues have lymphadenopathy in her neck.  She had noticed some possible axillary lymphadenopathy recently as well  Last mammogram a month ago   She is seeing her oncologist next week, will have him  examine her lymph nodes  She has chronic shingles over her right ribs.  A few days ago she had a new vesicle/pustule which have broken this distribution.  It has now crusted over She is not currently taking antivirals  Patient Active Problem List   Diagnosis Date Noted   Fever 06/18/2018   Piriformis syndrome of right side 04/22/2017   Lateral epicondylitis of left elbow 04/06/2017   Solitary pulmonary nodule 10/29/2016   Chronic lymphocytic leukemia ([place]) 07/31/2016   Mass in neck 04/17/2016   Abdominal mass 04/17/2016   Asthma 12/12/2015   Multiple environmental allergies 12/12/2015   Chronic lower back pain 09/12/2015   Bipolar 1 disorder, depressed ([place]) 08/27/2015    Class: Chronic   Other specified hypothyroidism 08/23/2015   Borderline personality disorder ([place]) 08/15/2015   Severe benzodiazepine use disorder ([place]) 08/15/2015   Alcohol use disorder, moderate, dependence ([place]) 08/15/2015   PTSD (post-traumatic stress disorder) 08/15/2015   History of bipolar disorder 08/15/2015   Pre-syncope 05/17/2015   Skull deformity 05/17/2015   Patellofemoral syndrome of both knees 05/02/2015    Past Medical History:  Diagnosis Date   Anxiety    Arthritis    Asthma    Bipolar disorder ([place])    Bulging lumbar disc 07/19/05   Chronic headaches    Chronic  lymphocytic leukemia ([place]) 07/31/2016   Colon polyps    Degenerative disorder of bone    Depression    History of borderline personality disorder    Hypothyroidism 2007   Developed after use of Lithium   IBS (irritable bowel syndrome) 1995   Pneumonia    Proctitis    Shingles 07/2018   Substance abuse ([place])    ativan last month    Past Surgical History:  Procedure Laterality Date   ABDOMINAL HYSTERECTOMY     APPENDECTOMY     BUNIONECTOMY Right 06/2012   CHOLECYSTECTOMY     OOPHORECTOMY     SHOULDER OPEN ROTATOR CUFF REPAIR Right 03/2010   TUBAL LIGATION      Social  History   Tobacco Use   Smoking status: Former Smoker    Quit date: 07/20/1980    Years since quitting: 38.6   Smokeless tobacco: Never Used  Substance Use Topics   Alcohol use: No    Alcohol/week: 0.0 standard drinks    Comment: unsure   Drug use: Yes    Types: Benzodiazepines    Family History  Problem Relation Age of Onset   Depression Mother    Hypertension Mother    Post-traumatic stress disorder Sister    Alcohol abuse Brother    Drug abuse Brother    Post-traumatic stress disorder Brother    Thyroid disease Father    Alzheimer's disease Father    COPD Maternal Grandmother    Heart disease Maternal Grandfather    Arthritis Paternal Grandmother    Diabetes Paternal Grandmother    Depression Paternal Grandmother    Alzheimer's disease Paternal Grandmother    Heart disease Paternal Grandfather    Coronary artery disease Paternal Grandfather    Alcohol abuse Paternal Grandfather    Colon cancer Neg Hx    Breast cancer Neg Hx     Allergies  Allergen Reactions   Aripiprazole Anaphylaxis and Swelling   Ciprofloxacin Shortness Of Breath   Lamotrigine Rash and Other (See Comments)   Nitrofurantoin Hives   Other Anaphylaxis, Hives and Swelling    Walnuts and pine nuts   Peanut Oil Anaphylaxis, Hives, Swelling and Rash   Amoxicillin Hives and Rash    Has patient had a PCN reaction causing immediate rash, facial/tongue/throat swelling, SOB or lightheadedness with hypotension: Yes Has patient had a PCN reaction causing severe rash involving mucus membranes or skin necrosis: No Has patient had a PCN reaction that required hospitalization: No Has patient had a PCN reaction occurring within the last 10 years: No If all of the above answers are "NO", then may proceed with Cephalosporin use.   Doxycycline Hives and Rash   Latex Rash and Other (See Comments)   Meloxicam Itching and Other (See Comments)    Induces mania Interacts with Lithium    Naproxen Hives and Other (See Comments)    Interacts with lithium   Pramipexole Hives and Rash   Prednisone Other (See Comments)    Interacts with Lithium   Risperidone Hives and Rash   Sulfa Antibiotics Rash    "that leaves scarring"   Tape Itching and Rash    Steri-Strips   Cortisone Other (See Comments)    Exacerbates the mania of her bipolar   Percocet [Oxycodone-Acetaminophen] Other (See Comments)    Severe dizziness   Shingrix [Zoster Vac Recomb Adjuvanted]     Medication list has been reviewed and updated.  Current Outpatient Medications on File Prior to Visit  Medication Sig Dispense  Refill   acetaminophen (TYLENOL) 500 MG tablet Take 1,000 mg by mouth every 6 (six) hours as needed for mild pain.     albuterol (PROVENTIL HFA;VENTOLIN HFA) 108 (90 Base) MCG/ACT inhaler Inhale 1-2 puffs into the lungs every 6 (six) hours as needed for wheezing or shortness of breath. 1 each 3   cephALEXin (KEFLEX) 500 MG capsule Take 1 capsule (500 mg total) by mouth 4 (four) times daily. 20 capsule 0   diphenhydrAMINE (BENADRYL ALLERGY) 25 mg capsule Take 25 mg by mouth every 6 (six) hours as needed.     diphenhydrAMINE (BENADRYL) 25 MG tablet Take 1 tablet (25 mg total) by mouth every 6 (six) hours as needed for itching. 20 tablet 0   EPINEPHrine 0.3 mg/0.3 mL IJ SOAJ injection Inject 0.3 mg into the muscle as needed (allergic reaction).      famciclovir (FAMVIR) 500 MG tablet Take 1 tablet (500 mg total) by mouth daily. 90 tablet 3   famotidine (PEPCID) 20 MG tablet Take 1 tablet (20 mg total) by mouth 2 (two) times daily. 30 tablet 0   fluticasone furoate-vilanterol (BREO ELLIPTA) 100-25 MCG/INH AEPB Inhale 1 puff into the lungs daily. 1 each 5   HYDROcodone-acetaminophen (NORCO/VICODIN) 5-325 MG tablet Take 1-2 tablets by mouth every 6 (six) hours as needed for moderate pain or severe pain. 15 tablet 0   hyoscyamine (LEVSIN, ANASPAZ) 0.125 MG tablet Take 1 tablet  (0.125 mg total) by mouth as needed for cramping. Reported on 09/24/2015 10 tablet 2   levothyroxine (SYNTHROID) 25 MCG tablet Take 1 tablet (25 mcg total) by mouth daily before breakfast. 30 tablet 5   LORazepam (ATIVAN) 0.5 MG tablet Take 1 tablet (0.5 mg total) by mouth every 12 (twelve) hours as needed for anxiety. 30 tablet 1   rizatriptan (MAXALT-MLT) 10 MG disintegrating tablet Take 1 tablet (10 mg total) by mouth as needed for migraine. May repeat in 2 hours if needed 9 tablet 11   topiramate (TOPAMAX) 50 MG tablet Take 1 tablet (50 mg total) by mouth 2 (two) times daily. 60 tablet 12   UNABLE TO FIND Take by mouth. ELDERBERRY FRUIT ORAL     No current facility-administered medications on file prior to visit.     Review of Systems:  As per HPI- otherwise negative. No fever or chills  Physical Examination: Vitals:   03/20/19 1035  BP: 118/70  Pulse: 87  Resp: 16  Temp: (!) 97.2 F (36.2 C)  SpO2: 99%   Vitals:   03/20/19 1035  Weight: 178 lb (80.7 kg)  Height: 5' 3.5" (1.613 m)   Body mass index is 31.04 kg/m. Ideal Body Weight: Weight in (lb) to have BMI = 25: 143.1  GEN: WDWN, NAD, Non-toxic, A & O x 3, overweight, appears her normal self HEENT: Atraumatic, Normocephalic. Neck supple. No masses, cervical lymphadenopathy is present Bilateral TM wnl, oropharynx normal.  PEERL,EOMI.   I do not appreciate any significant axillary LAD today  Ears and Nose: No external deformity. CV: RRR, No M/G/R. No JVD. No thrill. No extra heart sounds. PULM: CTA B, no wheezes, crackles, rhonchi. No retractions. No resp. distress. No accessory muscle use. ABD: S, ND, +BS. No rebound. No HSM.  She notes mild tenderness which seems generalized over her abdomen Continues to have chronic evidence of previous shingles rash over her right ribs. There is one never spot present today  EXTR: No c/c/e NEURO Normal gait.  PSYCH: Normally interactive. Conversant. Not depressed or  anxious  appearing.  Calm demeanor.    Assessment and Plan: Elevated lipase  Thyroid disorder  Acute pancreatitis without infection or necrosis, unspecified pancreatitis type - Plan: Lipase, CANCELED: CBC  CLL (chronic lymphocytic leukemia) (HCC) - Plan: CBC with Differential/Platelet, CANCELED: CBC  Herpes zoster with other complication - Plan: valACYclovir (VALTREX) 1000 MG tablet  Needs flu shot - Plan: Flu Vaccine QUAD 6+ mos PF IM (Fluarix Quad PF)  Following up on a few concerns today Flu shot given Check lipase and CBC to follow-up on pancreatitis Gave a week of valtrex She is seeing oncology next week and will ask them to check her nodes   Signed Lamar Blinks, MD  Received her labs, message to pt  Results for orders placed or performed in visit on 03/20/19  Lipase  Result Value Ref Range   Lipase 114.0 (H) 11.0 - 59.0 U/L  CBC with Differential/Platelet  Result Value Ref Range   WBC 10.9 (H) 4.0 - 10.5 K/uL   RBC 4.77 3.87 - 5.11 Mil/uL   Hemoglobin 13.5 12.0 - 15.0 g/dL   HCT 41.1 36.0 - 46.0 %   MCV 86.1 78.0 - 100.0 fl   MCHC 32.9 30.0 - 36.0 g/dL   RDW 13.7 11.5 - 15.5 %   Platelets 227.0 150.0 - 400.0 K/uL   Neutrophils Relative % 29.1 (L) 43.0 - 77.0 %   Lymphocytes Relative 61.4 Repeated and verified X2. (H) 12.0 - 46.0 %   Monocytes Relative 5.5 3.0 - 12.0 %   Eosinophils Relative 3.3 0.0 - 5.0 %   Basophils Relative 0.7 0.0 - 3.0 %   Neutro Abs 3.2 1.4 - 7.7 K/uL   Lymphs Abs 6.7 (H) 0.7 - 4.0 K/uL   Monocytes Absolute 0.6 0.1 - 1.0 K/uL   Eosinophils Absolute 0.4 0.0 - 0.7 K/uL   Basophils Absolute 0.1 0.0 - 0.1 K/uL

## 2019-03-20 ENCOUNTER — Encounter: Payer: Self-pay | Admitting: Family Medicine

## 2019-03-20 ENCOUNTER — Encounter: Payer: Self-pay | Admitting: General Practice

## 2019-03-20 ENCOUNTER — Ambulatory Visit (INDEPENDENT_AMBULATORY_CARE_PROVIDER_SITE_OTHER): Payer: Medicare Other | Admitting: Family Medicine

## 2019-03-20 ENCOUNTER — Other Ambulatory Visit: Payer: Self-pay

## 2019-03-20 VITALS — BP 118/70 | HR 87 | Temp 97.2°F | Resp 16 | Ht 63.5 in | Wt 178.0 lb

## 2019-03-20 DIAGNOSIS — R748 Abnormal levels of other serum enzymes: Secondary | ICD-10-CM | POA: Diagnosis not present

## 2019-03-20 DIAGNOSIS — K859 Acute pancreatitis without necrosis or infection, unspecified: Secondary | ICD-10-CM

## 2019-03-20 DIAGNOSIS — Z23 Encounter for immunization: Secondary | ICD-10-CM | POA: Diagnosis not present

## 2019-03-20 DIAGNOSIS — E079 Disorder of thyroid, unspecified: Secondary | ICD-10-CM

## 2019-03-20 DIAGNOSIS — B028 Zoster with other complications: Secondary | ICD-10-CM

## 2019-03-20 DIAGNOSIS — C9110 Chronic lymphocytic leukemia of B-cell type not having achieved remission: Secondary | ICD-10-CM | POA: Diagnosis not present

## 2019-03-20 LAB — CBC WITH DIFFERENTIAL/PLATELET
Basophils Absolute: 0.1 10*3/uL (ref 0.0–0.1)
Basophils Relative: 0.7 % (ref 0.0–3.0)
Eosinophils Absolute: 0.4 10*3/uL (ref 0.0–0.7)
Eosinophils Relative: 3.3 % (ref 0.0–5.0)
HCT: 41.1 % (ref 36.0–46.0)
Hemoglobin: 13.5 g/dL (ref 12.0–15.0)
Lymphocytes Relative: 61.4 % — ABNORMAL HIGH (ref 12.0–46.0)
Lymphs Abs: 6.7 10*3/uL — ABNORMAL HIGH (ref 0.7–4.0)
MCHC: 32.9 g/dL (ref 30.0–36.0)
MCV: 86.1 fl (ref 78.0–100.0)
Monocytes Absolute: 0.6 10*3/uL (ref 0.1–1.0)
Monocytes Relative: 5.5 % (ref 3.0–12.0)
Neutro Abs: 3.2 10*3/uL (ref 1.4–7.7)
Neutrophils Relative %: 29.1 % — ABNORMAL LOW (ref 43.0–77.0)
Platelets: 227 10*3/uL (ref 150.0–400.0)
RBC: 4.77 Mil/uL (ref 3.87–5.11)
RDW: 13.7 % (ref 11.5–15.5)
WBC: 10.9 10*3/uL — ABNORMAL HIGH (ref 4.0–10.5)

## 2019-03-20 LAB — LIPASE: Lipase: 114 U/L — ABNORMAL HIGH (ref 11.0–59.0)

## 2019-03-20 MED ORDER — VALACYCLOVIR HCL 1 G PO TABS: 1000.0000 mg | ORAL_TABLET | Freq: Three times a day (TID) | ORAL | 0 refills | Status: DC

## 2019-03-20 NOTE — Patient Instructions (Addendum)
Good to see you again today!  I will be in touch with your labs asap  We did a lipase for you today I also did a CBC with diff- this might be helpful when you see oncology as well  Flu shot given today We will use a week course of valtrex for possible shingles exacerbation

## 2019-03-20 NOTE — Progress Notes (Signed)
[place] Spiritual Care Note  Received and returned email about a listening opportunity that was spiritually meaningful to her. Plan to listen to her suggestion and will follow up with more detailed content when we talk next.   [place], [place], [facility] Pager [phone] Voicemail [phone]

## 2019-03-21 ENCOUNTER — Encounter: Payer: Self-pay | Admitting: Family Medicine

## 2019-03-21 DIAGNOSIS — R748 Abnormal levels of other serum enzymes: Secondary | ICD-10-CM

## 2019-03-21 DIAGNOSIS — K859 Acute pancreatitis without necrosis or infection, unspecified: Secondary | ICD-10-CM

## 2019-03-23 ENCOUNTER — Encounter (HOSPITAL_BASED_OUTPATIENT_CLINIC_OR_DEPARTMENT_OTHER): Payer: Self-pay

## 2019-03-23 ENCOUNTER — Other Ambulatory Visit: Payer: Self-pay

## 2019-03-23 ENCOUNTER — Ambulatory Visit (INDEPENDENT_AMBULATORY_CARE_PROVIDER_SITE_OTHER): Payer: Medicare Other | Admitting: Psychiatry

## 2019-03-23 ENCOUNTER — Ambulatory Visit (HOSPITAL_BASED_OUTPATIENT_CLINIC_OR_DEPARTMENT_OTHER)
Admission: RE | Admit: 2019-03-23 | Discharge: 2019-03-23 | Disposition: A | Discharge status: Home / Self Care | Payer: Medicare Other | Source: Ambulatory Visit | Attending: Family Medicine | Admitting: Family Medicine

## 2019-03-23 DIAGNOSIS — F3173 Bipolar disorder, in partial remission, most recent episode manic: Secondary | ICD-10-CM

## 2019-03-23 DIAGNOSIS — F4310 Post-traumatic stress disorder, unspecified: Secondary | ICD-10-CM

## 2019-03-23 DIAGNOSIS — R748 Abnormal levels of other serum enzymes: Secondary | ICD-10-CM

## 2019-03-23 DIAGNOSIS — K859 Acute pancreatitis without necrosis or infection, unspecified: Secondary | ICD-10-CM | POA: Diagnosis not present

## 2019-03-23 DIAGNOSIS — K7689 Other specified diseases of liver: Secondary | ICD-10-CM | POA: Diagnosis not present

## 2019-03-23 MED ORDER — IOHEXOL 300 MG/ML  SOLN
100.0000 mL | Freq: Once | INTRAMUSCULAR | Status: AC | PRN
  Administered 2019-03-23: 100 mL via INTRAVENOUS

## 2019-03-24 ENCOUNTER — Telehealth: Payer: Self-pay | Admitting: Family Medicine

## 2019-03-24 NOTE — Telephone Encounter (Signed)
Called her to discuss her recent CT Her amylase is going down steadily but she continues to feel poorly- however not acutely worse   Her GI doc is Oretha Caprice at [facility] him and will ask his staff to set up a follow-up appt   She will contact me if not ok over the weekend  JC  Ct Abdomen Pelvis W Contrast  Result Date: 03/24/2019 CLINICAL DATA:  Severe acute abdominal pain. Elevated lipase. Pancreatitis. Chronic lymphocytic leukemia. EXAM: CT ABDOMEN AND PELVIS WITH CONTRAST TECHNIQUE: Multidetector CT imaging of the abdomen and pelvis was performed using the standard protocol following bolus administration of intravenous contrast. CONTRAST:  126mL OMNIPAQUE IOHEXOL 300 MG/ML  SOLN COMPARISON:  02/10/2019 FINDINGS: Lower Chest: No acute findings. Hepatobiliary: Multiple small hepatic cysts remain stable. No hepatic masses identified. Prior cholecystectomy. No evidence of biliary obstruction. Pancreas: Normal appearance. No evidence of pancreatic edema or calcification. No evidence of peripancreatic inflammatory changes or abnormal fluid collections. No evidence of pancreatic mass or ductal dilatation. Spleen: Within normal limits in size and appearance. Adrenals/Urinary Tract: No masses identified. No evidence of hydronephrosis. Stomach/Bowel: No evidence of obstruction, inflammatory process or abnormal fluid collections. Vascular/Lymphatic: Diffuse lymphadenopathy throughout the abdomen and pelvis shows no significant interval change. Index lymph node in the porta hepatis measures 2.1 cm on image 16/2, and another index lymph node in the left external iliac chain measures 2.5 cm on image 68/2, both without change. No new or increased areas of lymphadenopathy identified. No abdominal aortic aneurysm. Reproductive: Prior hysterectomy noted. Adnexal regions are unremarkable in appearance. Other:  None. Musculoskeletal:  No suspicious bone lesions identified. IMPRESSION: 1. No radiographic  evidence of pancreatitis or other acute findings. 2. Diffuse abdominal and pelvic lymphadenopathy shows no significant change compared to most recent exam, consistent with known chronic lymphocytic leukemia. Electronically Signed   By: Marlaine Hind M.D.   On: 03/24/2019 09:27

## 2019-03-27 ENCOUNTER — Encounter: Payer: Self-pay | Admitting: Family Medicine

## 2019-03-28 ENCOUNTER — Encounter: Payer: Self-pay | Admitting: Family Medicine

## 2019-03-28 ENCOUNTER — Telehealth: Payer: Self-pay

## 2019-03-28 NOTE — Telephone Encounter (Signed)
-----   Message from Milus Banister, MD sent at 03/28/2019  9:06 AM EDT ----- Keane Scrape, Thanks. We will reach out to her.  Adrienne Mocha, Can you please offer her ROV with me, next available for abd pains, elevated lipase.  Thanks  ----- Message ----- From: Darreld Mclean, MD Sent: 03/24/2019   8:08 PM EDT To: Milus Banister, MD  Jacqulyn Cane, I hope that you are well!  I have been seeing Allyah recently for persistent abd pain She has CLL, and seemed to have an episode of acute pancreatitis last month.  Her amylase is gradually trending down and CT scan this week was negative for pancreatic abnl, but she continues to have pain.  I wondered if you might ask your staff to schedule her for a follow-up with you?  I am not sure where to go with her next  Thank you so much Jess Copland

## 2019-03-28 NOTE — Telephone Encounter (Signed)
The pt has been scheduled for an appt and advised.

## 2019-03-28 NOTE — Telephone Encounter (Signed)
Called her and scheduled for a visit tomorrow

## 2019-03-29 ENCOUNTER — Other Ambulatory Visit: Payer: Self-pay

## 2019-03-29 ENCOUNTER — Ambulatory Visit (INDEPENDENT_AMBULATORY_CARE_PROVIDER_SITE_OTHER): Payer: Medicare Other | Admitting: Family Medicine

## 2019-03-29 ENCOUNTER — Encounter: Payer: Self-pay | Admitting: Family Medicine

## 2019-03-29 VITALS — BP 112/78 | HR 68 | Temp 95.9°F | Resp 16 | Ht 63.5 in | Wt 175.0 lb

## 2019-03-29 DIAGNOSIS — J45909 Unspecified asthma, uncomplicated: Secondary | ICD-10-CM

## 2019-03-29 DIAGNOSIS — W57XXXA Bitten or stung by nonvenomous insect and other nonvenomous arthropods, initial encounter: Secondary | ICD-10-CM

## 2019-03-29 DIAGNOSIS — R748 Abnormal levels of other serum enzymes: Secondary | ICD-10-CM | POA: Diagnosis not present

## 2019-03-29 DIAGNOSIS — F4310 Post-traumatic stress disorder, unspecified: Secondary | ICD-10-CM

## 2019-03-29 DIAGNOSIS — F319 Bipolar disorder, unspecified: Secondary | ICD-10-CM | POA: Diagnosis not present

## 2019-03-29 DIAGNOSIS — S80869A Insect bite (nonvenomous), unspecified lower leg, initial encounter: Secondary | ICD-10-CM | POA: Diagnosis not present

## 2019-03-29 MED ORDER — CEPHALEXIN 500 MG PO CAPS: 500.0000 mg | ORAL_CAPSULE | Freq: Four times a day (QID) | ORAL | 0 refills | Status: DC

## 2019-03-29 NOTE — Progress Notes (Signed)
[place] at [facility], [place], [place] [address] [phone]  Date:  03/29/2019   Name:  Oren Coxson   DOB:  08-Apr-1966   MRN:  [id number]  PCP:  Darreld Mclean, MD    Chief Complaint: Blister (bites on legs turned to blisters, redness, swelling and burning)   History of Present Illness:  Laila Jarzabek is a 53 y.o. very pleasant female patient who presents with the following:  Pt with history of CLL, chronic shingles.  She has recently been troubled with persistent abdominal pain and mildly elevated lipase. We did a CT scan of her abdomen last week which was negative.  GI appointment is pending in about 1 month she contacted me yesterday with concern of an allergic reaction/ bug bites and we scheduled this appt   She got some presumed insect bites on sunday- today is Wednesday She went for a walk outdoors with her daughter and was standing outdoors in her front yard talking for a while- we think this is where she got bitten.  Her daughter did not seem to get the bug bites She has had some dramatic swelling and itching located around discrete insect bite locations  Her abd pain is about the same or better than it has been She is able to eat without symptoms She is doing a dietary detox and avoiding most processed foods and grains- mostly fruits, veggies and protein-this seems to be helping  She is allergic to amoxicillin, but recently took Keflex without any ill effects  [place] also recently received a DMV paperwork packet in the mail.  She has received this annually we think since her psychiatric hospitalization 2017.  She has had no motor vehicle accidents, her psychiatric symptoms are under good control.  She denies any intention to hurt herself or others with a motor vehicle.  She does use as needed Ativan, but carefully avoids using this when she will be driving.  Lab Results  Component Value Date   LIPASE  114.0 (H) 03/20/2019   Wt Readings from Last 3 Encounters:  03/29/19 175 lb (79.4 kg)  03/20/19 178 lb (80.7 kg)  03/01/19 174 lb (78.9 kg)     Patient Active Problem List   Diagnosis Date Noted   Fever 06/18/2018   Piriformis syndrome of right side 04/22/2017   Lateral epicondylitis of left elbow 04/06/2017   Solitary pulmonary nodule 10/29/2016   Chronic lymphocytic leukemia ([place]) 07/31/2016   Mass in neck 04/17/2016   Abdominal mass 04/17/2016   Asthma 12/12/2015   Multiple environmental allergies 12/12/2015   Chronic lower back pain 09/12/2015   Bipolar 1 disorder, depressed ([employer]) 08/27/2015    Class: Chronic   Other specified hypothyroidism 08/23/2015   Borderline personality disorder ([place]) 08/15/2015   Severe benzodiazepine use disorder ([place]) 08/15/2015   Alcohol use disorder, moderate, dependence ([place]) 08/15/2015   PTSD (post-traumatic stress disorder) 08/15/2015   History of bipolar disorder 08/15/2015   Pre-syncope 05/17/2015   Skull deformity 05/17/2015   Patellofemoral syndrome of both knees 05/02/2015    Past Medical History:  Diagnosis Date   Anxiety    Arthritis    Asthma    Bipolar disorder ([place])    Bulging lumbar disc 07/19/05   Chronic headaches    Chronic lymphocytic leukemia ([place]) 07/31/2016   Colon polyps    Degenerative disorder of bone    Depression    History of borderline personality disorder  Hypothyroidism 2007   Developed after use of Lithium   IBS (irritable bowel syndrome) 1995   Pneumonia    Proctitis    Shingles 07/2018   Substance abuse ([place])    ativan last month    Past Surgical History:  Procedure Laterality Date   ABDOMINAL HYSTERECTOMY     APPENDECTOMY     BUNIONECTOMY Right 06/2012   CHOLECYSTECTOMY     OOPHORECTOMY     SHOULDER OPEN ROTATOR CUFF REPAIR Right 03/2010   TUBAL LIGATION      Social History   Tobacco Use   Smoking status: Former Smoker    Quit date:  07/20/1980    Years since quitting: 38.7   Smokeless tobacco: Never Used  Substance Use Topics   Alcohol use: No    Alcohol/week: 0.0 standard drinks    Comment: unsure   Drug use: Yes    Types: Benzodiazepines    Family History  Problem Relation Age of Onset   Depression Mother    Hypertension Mother    Post-traumatic stress disorder Sister    Alcohol abuse Brother    Drug abuse Brother    Post-traumatic stress disorder Brother    Thyroid disease Father    Alzheimer's disease Father    COPD Maternal Grandmother    Heart disease Maternal Grandfather    Arthritis Paternal Grandmother    Diabetes Paternal Grandmother    Depression Paternal Grandmother    Alzheimer's disease Paternal Grandmother    Heart disease Paternal Grandfather    Coronary artery disease Paternal Grandfather    Alcohol abuse Paternal Grandfather    Colon cancer Neg Hx    Breast cancer Neg Hx     Allergies  Allergen Reactions   Aripiprazole Anaphylaxis and Swelling   Ciprofloxacin Shortness Of Breath   Lamotrigine Rash and Other (See Comments)   Nitrofurantoin Hives   Other Anaphylaxis, Hives and Swelling    Walnuts and pine nuts   Peanut Oil Anaphylaxis, Hives, Swelling and Rash   Amoxicillin Hives and Rash    Has patient had a PCN reaction causing immediate rash, facial/tongue/throat swelling, SOB or lightheadedness with hypotension: Yes Has patient had a PCN reaction causing severe rash involving mucus membranes or skin necrosis: No Has patient had a PCN reaction that required hospitalization: No Has patient had a PCN reaction occurring within the last 10 years: No If all of the above answers are "NO", then may proceed with Cephalosporin use.   Doxycycline Hives and Rash   Latex Rash and Other (See Comments)   Meloxicam Itching and Other (See Comments)    Induces mania Interacts with Lithium   Naproxen Hives and Other (See Comments)    Interacts with lithium    Pramipexole Hives and Rash   Prednisone Other (See Comments)    Interacts with Lithium   Risperidone Hives and Rash   Sulfa Antibiotics Rash    "that leaves scarring"   Tape Itching and Rash    Steri-Strips   Cortisone Other (See Comments)    Exacerbates the mania of her bipolar   Percocet [Oxycodone-Acetaminophen] Other (See Comments)    Severe dizziness   Shingrix [Zoster Vac Recomb Adjuvanted]     Medication list has been reviewed and updated.  Current Outpatient Medications on File Prior to Visit  Medication Sig Dispense Refill   acetaminophen (TYLENOL) 500 MG tablet Take 1,000 mg by mouth every 6 (six) hours as needed for mild pain.     albuterol (PROVENTIL HFA;VENTOLIN HFA)  108 (90 Base) MCG/ACT inhaler Inhale 1-2 puffs into the lungs every 6 (six) hours as needed for wheezing or shortness of breath. 1 each 3   cephALEXin (KEFLEX) 500 MG capsule Take 1 capsule (500 mg total) by mouth 4 (four) times daily. 20 capsule 0   diphenhydrAMINE (BENADRYL ALLERGY) 25 mg capsule Take 25 mg by mouth every 6 (six) hours as needed.     diphenhydrAMINE (BENADRYL) 25 MG tablet Take 1 tablet (25 mg total) by mouth every 6 (six) hours as needed for itching. 20 tablet 0   EPINEPHrine 0.3 mg/0.3 mL IJ SOAJ injection Inject 0.3 mg into the muscle as needed (allergic reaction).      famotidine (PEPCID) 20 MG tablet Take 1 tablet (20 mg total) by mouth 2 (two) times daily. 30 tablet 0   fluticasone furoate-vilanterol (BREO ELLIPTA) 100-25 MCG/INH AEPB Inhale 1 puff into the lungs daily. 1 each 5   HYDROcodone-acetaminophen (NORCO/VICODIN) 5-325 MG tablet Take 1-2 tablets by mouth every 6 (six) hours as needed for moderate pain or severe pain. 15 tablet 0   hyoscyamine (LEVSIN, ANASPAZ) 0.125 MG tablet Take 1 tablet (0.125 mg total) by mouth as needed for cramping. Reported on 09/24/2015 10 tablet 2   levothyroxine (SYNTHROID) 25 MCG tablet Take 1 tablet (25 mcg total) by mouth daily  before breakfast. 30 tablet 5   LORazepam (ATIVAN) 0.5 MG tablet Take 1 tablet (0.5 mg total) by mouth every 12 (twelve) hours as needed for anxiety. 30 tablet 1   rizatriptan (MAXALT-MLT) 10 MG disintegrating tablet Take 1 tablet (10 mg total) by mouth as needed for migraine. May repeat in 2 hours if needed 9 tablet 11   topiramate (TOPAMAX) 50 MG tablet Take 1 tablet (50 mg total) by mouth 2 (two) times daily. 60 tablet 12   UNABLE TO FIND Take by mouth. ELDERBERRY FRUIT ORAL     valACYclovir (VALTREX) 1000 MG tablet Take 1 tablet (1,000 mg total) by mouth 3 (three) times daily. 21 tablet 0   No current facility-administered medications on file prior to visit.     Review of Systems: As per HPI- otherwise negative.  No fever or chills  Physical Examination: Vitals:   03/29/19 1444  BP: 112/78  Pulse: 68  Resp: 16  Temp: (!) 95.9 F (35.5 C)  SpO2: 98%   Vitals:   03/29/19 1444  Weight: 175 lb (79.4 kg)  Height: 5' 3.5" (1.613 m)   Body mass index is 30.51 kg/m. Ideal Body Weight: Weight in (lb) to have BMI = 25: 143.1  GEN: WDWN, NAD, Non-toxic, A & O x 3, overweight, looks well HEENT: Atraumatic, Normocephalic. Neck supple. No masses, No LAD. Ears and Nose: No external deformity. CV: RRR, No M/G/R. No JVD. No thrill. No extra heart sounds. PULM: CTA B, no wheezes, crackles, rhonchi. No retractions. No resp. distress. No accessory muscle use. ABD: S, NT, ND, +BS. No rebound. No HSM.  Abdomen seems benign today EXTR: No c/c/e NEURO Normal gait.  PSYCH: Normally interactive. Conversant. Not depressed or anxious appearing.  Calm demeanor.  She has discrete insect bites located on her lower legs, a few on her chest, neck, left arm. A couple of these have developed a vesicle with clear fluid.  They do not appear to be infected  Assessment and Plan: Insect bite of lower leg, unspecified laterality, initial encounter  Elevated lipase  PTSD (post-traumatic stress  disorder)  Bipolar 1 disorder, depressed (HCC)  Mild asthma without complication,  unspecified whether persistent   Given rx for keflex to hold and use if needed-at this point I do not think her bites appear to be infected, but this way she will have it on hand if need be She took this med last month and had no allergic reaction Complete her DMV paperwork.  Again, stated on forms that I do not think she needs to continue annual reassessment Her abdominal symptoms are stable, she is actually feeling a bit better in this regard.  She will let me know if any change or worsening Signed Lamar Blinks, MD

## 2019-03-29 NOTE — Patient Instructions (Signed)
Good to see you today At this point I don't think your bug bites are infected.  I would use topical steroid such as cortisone cream If they start getting worse can use an antibiotic but right now now needed   DMV paperwork completed today

## 2019-03-30 DIAGNOSIS — J45909 Unspecified asthma, uncomplicated: Secondary | ICD-10-CM | POA: Diagnosis not present

## 2019-03-30 DIAGNOSIS — K858 Other acute pancreatitis without necrosis or infection: Secondary | ICD-10-CM | POA: Diagnosis not present

## 2019-03-30 DIAGNOSIS — B029 Zoster without complications: Secondary | ICD-10-CM | POA: Diagnosis not present

## 2019-03-30 DIAGNOSIS — C9110 Chronic lymphocytic leukemia of B-cell type not having achieved remission: Secondary | ICD-10-CM | POA: Diagnosis not present

## 2019-03-30 DIAGNOSIS — R599 Enlarged lymph nodes, unspecified: Secondary | ICD-10-CM | POA: Diagnosis not present

## 2019-03-30 DIAGNOSIS — R1013 Epigastric pain: Secondary | ICD-10-CM | POA: Diagnosis not present

## 2019-04-02 ENCOUNTER — Encounter: Payer: Self-pay | Admitting: Family Medicine

## 2019-04-03 ENCOUNTER — Encounter: Payer: Self-pay | Admitting: General Practice

## 2019-04-03 NOTE — Progress Notes (Signed)
[place] Spiritual Care Note  Received and returned call from [place], setting up socially distant Spiritual Care visit in healing garden tomorrow if weather permits.   [place], [place], [facility] Pager [phone] Voicemail [phone]

## 2019-04-04 ENCOUNTER — Encounter: Payer: Self-pay | Admitting: General Practice

## 2019-04-04 NOTE — Progress Notes (Signed)
[place] Spiritual Care Note  Met for a socially distant Spiritual Care session in proper PPE in healing garden as planned. Per Jaclyn Shaggy, she is working with a Social worker at [employer], one of her other care sites, to develop a comprehensive, proactive plan for supporting her mental health in anticipation of starting CLL tx. She was in good spirits today, feeling empowered and supported. As her needs and hopes become clearer, she plans to share more about how she envisions Spiritual Care as part of her spiritual-emotional-mental health care plan.   [place], [place], [facility] Pager [phone] Voicemail [phone]

## 2019-04-06 ENCOUNTER — Encounter: Payer: Self-pay | Admitting: General Practice

## 2019-04-06 ENCOUNTER — Ambulatory Visit (INDEPENDENT_AMBULATORY_CARE_PROVIDER_SITE_OTHER): Payer: Medicare Other | Admitting: Psychiatry

## 2019-04-06 DIAGNOSIS — F3173 Bipolar disorder, in partial remission, most recent episode manic: Secondary | ICD-10-CM | POA: Diagnosis not present

## 2019-04-06 DIAGNOSIS — F4310 Post-traumatic stress disorder, unspecified: Secondary | ICD-10-CM | POA: Diagnosis not present

## 2019-04-06 NOTE — Progress Notes (Signed)
[place] Spiritual Care Note  Phoned Morgan [name] and talked her through a breathing exercise to help her release anxiety, including prayer and blessing, per her request. She plans to email or phone again as needed.   [place], [place], [facility] Pager [phone] Voicemail [phone]

## 2019-04-10 ENCOUNTER — Encounter: Payer: Self-pay | Admitting: General Practice

## 2019-04-10 NOTE — Progress Notes (Signed)
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 She states that she is not planning any self-harm and scheduled an urgent therapy appt with Dr Josefine Class at 10am tomorrow for additional support. Morgan [name] plans to check in with me sometime thereafter.   [place], [place], [facility] Pager [phone] Voicemail [phone]

## 2019-04-11 ENCOUNTER — Ambulatory Visit (INDEPENDENT_AMBULATORY_CARE_PROVIDER_SITE_OTHER): Payer: Medicare Other | Admitting: Psychiatry

## 2019-04-11 ENCOUNTER — Encounter: Payer: Self-pay | Admitting: General Practice

## 2019-04-11 DIAGNOSIS — F4310 Post-traumatic stress disorder, unspecified: Secondary | ICD-10-CM

## 2019-04-11 DIAGNOSIS — F3173 Bipolar disorder, in partial remission, most recent episode manic: Secondary | ICD-10-CM | POA: Diagnosis not present

## 2019-04-11 NOTE — Progress Notes (Signed)
[place] Note  Received check-in call and email from [place] following her counseling appt today. Spoke by phone, providing guided breathing meditation per her request, which brought many emotions to the surface. Provided pastoral presence, empathic listening, and pastoral reflection as she processed and released her feelings. She plans to contact chaplain again when needed.   [place], [place], [facility] Pager [phone] Voicemail [phone]

## 2019-04-13 ENCOUNTER — Inpatient Hospital Stay: Payer: Medicare Other | Attending: Nutrition | Admitting: Nutrition

## 2019-04-13 ENCOUNTER — Ambulatory Visit (INDEPENDENT_AMBULATORY_CARE_PROVIDER_SITE_OTHER): Payer: Medicare Other | Admitting: Psychiatry

## 2019-04-13 DIAGNOSIS — F3173 Bipolar disorder, in partial remission, most recent episode manic: Secondary | ICD-10-CM

## 2019-04-13 DIAGNOSIS — F4310 Post-traumatic stress disorder, unspecified: Secondary | ICD-10-CM | POA: Diagnosis not present

## 2019-04-13 NOTE — Progress Notes (Signed)
Patient requested nutrition consult.  Patient is no longer receiving treatment for CLL.  She reports she has many food allergies and food intolerances.  She would like to be sure that she is eating the right foods in case she ever needs to have treatment for her CLL again.  Education was provided to patient on consuming plant-based diet with lean protein, fruits, vegetables, and whole grains as tolerated. Encourage plenty of fluids. Recommended patient continue activity as tolerated.  Fact sheets were provided. No follow-up scheduled at this time.  **Disclaimer: This note was dictated with voice recognition software. Similar sounding words can inadvertently be transcribed and this note may contain transcription errors which may not have been corrected upon publication of note.**

## 2019-04-18 ENCOUNTER — Encounter: Payer: Self-pay | Admitting: General Practice

## 2019-04-18 ENCOUNTER — Encounter: Payer: Self-pay | Admitting: Family Medicine

## 2019-04-18 ENCOUNTER — Ambulatory Visit (INDEPENDENT_AMBULATORY_CARE_PROVIDER_SITE_OTHER): Payer: Medicare Other | Admitting: Psychiatry

## 2019-04-18 DIAGNOSIS — F4310 Post-traumatic stress disorder, unspecified: Secondary | ICD-10-CM | POA: Diagnosis not present

## 2019-04-18 DIAGNOSIS — R748 Abnormal levels of other serum enzymes: Secondary | ICD-10-CM

## 2019-04-18 DIAGNOSIS — F3173 Bipolar disorder, in partial remission, most recent episode manic: Secondary | ICD-10-CM

## 2019-04-18 NOTE — Telephone Encounter (Signed)
Morgan [name] can you please set her up for a lab appt tomorrow?  Thank you

## 2019-04-19 ENCOUNTER — Other Ambulatory Visit: Payer: Self-pay

## 2019-04-19 ENCOUNTER — Other Ambulatory Visit (INDEPENDENT_AMBULATORY_CARE_PROVIDER_SITE_OTHER): Payer: Medicare Other

## 2019-04-19 DIAGNOSIS — R748 Abnormal levels of other serum enzymes: Secondary | ICD-10-CM

## 2019-04-19 NOTE — Progress Notes (Signed)
[place] Spiritual Care Note  Spoke with [facility] by phone as scheduled. She is working hard to support herself through emotional challenges that are coming up through her trauma-recovery work with Dr Myrtie Cruise. Myishia looks to Spiritual Care for pastoral presence, empathic listening, and prayer, understanding that the chaplain's role and training are different from those of her counselor. She has identified her most pressing goal for her appt with Dr Ouida Sills tomorrow: to make a plan for whom to contact and how to practice self-care specifically when she is unable to reach Dr Ouida Sills for support with intense emotional experiences. We have a pastoral follow-up call scheduled tomorrow afternoon.   [place], [place], [name] Pager [phone] Voicemail [phone]

## 2019-04-20 ENCOUNTER — Encounter: Payer: Self-pay | Admitting: Family Medicine

## 2019-04-20 ENCOUNTER — Encounter: Payer: Self-pay | Admitting: General Practice

## 2019-04-20 ENCOUNTER — Ambulatory Visit (INDEPENDENT_AMBULATORY_CARE_PROVIDER_SITE_OTHER): Payer: Medicare Other | Admitting: Psychiatry

## 2019-04-20 DIAGNOSIS — F4310 Post-traumatic stress disorder, unspecified: Secondary | ICD-10-CM | POA: Diagnosis not present

## 2019-04-20 DIAGNOSIS — F3173 Bipolar disorder, in partial remission, most recent episode manic: Secondary | ICD-10-CM | POA: Diagnosis not present

## 2019-04-20 LAB — LIPASE: Lipase: 126 U/L — ABNORMAL HIGH (ref 11.0–59.0)

## 2019-04-20 NOTE — Progress Notes (Signed)
[place] Spiritual Care Note  Followed up with [place] by [employer] as scheduled, providing opportunity for her to process insights and coping strategies from her appt with Dr Ouida Sills this morning. Due to an interruption, I have LVM and plan to f/u by phone tomorrow.   [place], [place], [facility] Pager [phone] Voicemail [phone]

## 2019-04-25 ENCOUNTER — Ambulatory Visit (INDEPENDENT_AMBULATORY_CARE_PROVIDER_SITE_OTHER): Payer: Medicare Other | Admitting: Psychiatry

## 2019-04-25 DIAGNOSIS — F4310 Post-traumatic stress disorder, unspecified: Secondary | ICD-10-CM

## 2019-04-25 DIAGNOSIS — F3173 Bipolar disorder, in partial remission, most recent episode manic: Secondary | ICD-10-CM

## 2019-04-26 ENCOUNTER — Ambulatory Visit: Payer: Medicare Other | Admitting: Gastroenterology

## 2019-04-26 ENCOUNTER — Encounter: Payer: Self-pay | Admitting: Gastroenterology

## 2019-04-26 VITALS — BP 116/64 | HR 72 | Temp 97.5°F | Ht 63.5 in | Wt 174.0 lb

## 2019-04-26 DIAGNOSIS — R109 Unspecified abdominal pain: Secondary | ICD-10-CM

## 2019-04-26 NOTE — Patient Instructions (Signed)
You have been scheduled for an endoscopy. Please follow written instructions given to you at your visit today. If you use inhalers (even only as needed), please bring them with you on the day of your procedure. Your physician has requested that you go to www.startemmi.com and enter the access code given to you at your visit today. This web site gives a general overview about your procedure. However, you should still follow specific instructions given to you by our office regarding your preparation for the procedure.  Please purchase the following medications over the counter and take as directed: Imodium  Start taking Imodium 1 tad every morning  Thank you for entrusting me with your care and choosing [facility].  Dr Ardis Hughs

## 2019-04-26 NOTE — Progress Notes (Signed)
HPI: This is a 53 year old woman whom I last saw about 7 months ago.  Colonoscopy 09/2015 Dr. Ardis Hughs for recent fecal occult positive stool was normal.  She was recommended to have repeat colonoscopy for colon cancer screening at 10-year interval  EGD, Dr. Ardis Hughs March 2017 for postprandial abdominal pain, intermittent dysphasia found mild nonspecific distal gastritis, the examination was otherwise normal.  Biopsies were negative for H. Pylori.  I last saw her about 7 months ago for some abdominal pain that ended up being due to herpes zoster, shingles on her right flank and back  She was in the emergency room about 2 and half months ago with multiple complaints, chills, headache, nausea and abdominal pain.  Lab testing showed a lipase that was barely elevated at 60.  She underwent a CT scan of her abdomen pelvis with IV and oral contrast which showed significant abdominal and pelvic lymphadenopathy.  About a month ago she had a repeat CT scan with IV and oral contrast showed essentially the same pelvic and abdominal lymphadenopathy, all consistent with her known CLL.  Her pancreas was normal-appearing.  Lipase has been rechecked several times over the past couple months showing elevation up to 187 max, generally right around 120 however.  She was told that she probably had mild acute pancreatitis.  Her liver tests have been persistently normal throughout the past couple months  In the office today I found it difficult to get a reliable history from her.  Her story seems to vary but it does seem like she is having postprandial issues sometimes pain, it usually nausea.  She is also having issues with her bowels including diarrhea nonbloody every morning.  She had a severe episode of abdominal pain and presented the emergency room with it.  Testing was as above.  She tells me she also has chest pains when laying down.  Her weight is overall stable.   Chief complaint is postprandial nausea,  chronically elevated lipase  ROS: complete GI ROS as described in HPI, all other review negative.  Constitutional:  No unintentional weight loss   Past Medical History:  Diagnosis Date  . Anxiety   . Arthritis   . Asthma   . Bipolar disorder ([place])   . Bulging lumbar disc 07/19/05  . Chronic headaches   . Chronic lymphocytic leukemia ([name]) 07/31/2016  . Colon polyps   . Degenerative disorder of bone   . Depression   . History of borderline personality disorder   . Hypothyroidism 2007   Developed after use of Lithium  . IBS (irritable bowel syndrome) 1995  . Pneumonia   . Proctitis   . Shingles 07/2018  . Substance abuse ([place] [place])    ativan last month    Past Surgical History:  Procedure Laterality Date  . ABDOMINAL HYSTERECTOMY    . APPENDECTOMY    . BUNIONECTOMY Right 06/2012  . CHOLECYSTECTOMY    . OOPHORECTOMY    . SHOULDER OPEN ROTATOR CUFF REPAIR Right 03/2010  . TUBAL LIGATION      Current Outpatient Medications  Medication Sig Dispense Refill  . acetaminophen (TYLENOL) 500 MG tablet Take 1,000 mg by mouth every 6 (six) hours as needed for mild pain.    Marland Kitchen albuterol (PROVENTIL HFA;VENTOLIN HFA) 108 (90 Base) MCG/ACT inhaler Inhale 1-2 puffs into the lungs every 6 (six) hours as needed for wheezing or shortness of breath. 1 each 3  . diphenhydrAMINE (BENADRYL) 25 MG tablet Take 1 tablet (25 mg total) by mouth  every 6 (six) hours as needed for itching. 20 tablet 0  . fluticasone furoate-vilanterol (BREO ELLIPTA) 100-25 MCG/INH AEPB Inhale 1 puff into the lungs daily. 1 each 5  . hyoscyamine (LEVSIN, ANASPAZ) 0.125 MG tablet Take 1 tablet (0.125 mg total) by mouth as needed for cramping. Reported on 09/24/2015 10 tablet 2  . LORazepam (ATIVAN) 0.5 MG tablet Take 1 tablet (0.5 mg total) by mouth every 12 (twelve) hours as needed for anxiety. 30 tablet 1  . rizatriptan (MAXALT-MLT) 10 MG disintegrating tablet Take 1 tablet (10 mg total) by mouth as needed for migraine. May  repeat in 2 hours if needed 9 tablet 11  . UNABLE TO FIND Take by mouth. ELDERBERRY FRUIT ORAL     No current facility-administered medications for this visit.     Allergies as of 04/26/2019 - Review Complete 04/26/2019  Allergen Reaction Noted  . Aripiprazole Anaphylaxis and Swelling 11/23/2012  . Ciprofloxacin Shortness Of Breath 01/10/2015  . Lamotrigine Rash and Other (See Comments) 11/23/2012  . Nitrofurantoin Hives 04/08/2018  . Other Anaphylaxis, Hives, and Swelling 11/23/2012  . Peanut oil Anaphylaxis, Hives, Swelling, and Rash 08/15/2015  . Amoxicillin Hives and Rash 11/23/2012  . Doxycycline Hives and Rash 11/23/2012  . Latex Rash and Other (See Comments) 11/23/2012  . Meloxicam Itching and Other (See Comments) 11/23/2012  . Naproxen Hives and Other (See Comments) 11/23/2012  . Pramipexole Hives and Rash 11/23/2012  . Prednisone Other (See Comments) 11/23/2012  . Risperidone Hives and Rash 11/23/2012  . Sulfa antibiotics Rash 11/23/2012  . Tape Itching and Rash 03/08/2015  . Cortisone Other (See Comments) 03/05/2015  . Percocet [oxycodone-acetaminophen] Other (See Comments) 06/19/2015  . Shingrix [zoster vac recomb adjuvanted]  03/01/2019    Family History  Problem Relation Age of Onset  . Depression Mother   . Hypertension Mother   . Post-traumatic stress disorder Sister   . Alcohol abuse Brother   . Drug abuse Brother   . Post-traumatic stress disorder Brother   . Thyroid disease Father   . Alzheimer's disease Father   . COPD Maternal Grandmother   . Heart disease Maternal Grandfather   . Arthritis Paternal Grandmother   . Diabetes Paternal Grandmother   . Depression Paternal Grandmother   . Alzheimer's disease Paternal Grandmother   . Heart disease Paternal Grandfather   . Coronary artery disease Paternal Grandfather   . Alcohol abuse Paternal Grandfather   . Colon cancer Neg Hx   . Breast cancer Neg Hx     Social History   Socioeconomic History  .  Marital status: Divorced    Spouse name: Not on file  . Number of children: 2  . Years of education: 63  . Highest education level: Not on file  Occupational History  . Occupation: [employer]  Social Needs  . Financial resource strain: Not on file  . Food insecurity    Worry: Not on file    Inability: Not on file  . Transportation needs    Medical: Not on file    Non-medical: Not on file  Tobacco Use  . Smoking status: Former Smoker    Quit date: 07/20/1980    Years since quitting: 38.7  . Smokeless tobacco: Never Used  Substance and Sexual Activity  . Alcohol use: No    Alcohol/week: 0.0 standard drinks    Comment: unsure  . Drug use: Yes    Types: Benzodiazepines  . Sexual activity: Never    Birth control/protection: Surgical  Comment: intercourse age unknown,sexual partners less than 5  Lifestyle  . Physical activity    Days per week: Not on file    Minutes per session: Not on file  . Stress: Not on file  Relationships  . Social Herbalist on phone: Not on file    Gets together: Not on file    Attends religious service: Not on file    Active member of club or organization: Not on file    Attends meetings of clubs or organizations: Not on file    Relationship status: Not on file  . Intimate partner violence    Fear of current or ex partner: Not on file    Emotionally abused: Not on file    Physically abused: Not on file    Forced sexual activity: Not on file  Other Topics Concern  . Not on file  Social History Narrative   Fun: [place], travel, music, writing, walking, hiking, volunteering   Denies religious beliefs effecting health care.    Feels safe at home.    Divorced, Works at [employer].  Children 2.  Lives home alone.     Physical Exam: BP 116/64   Pulse 72   Temp (!) 97.5 F (36.4 C) (Temporal)   Ht 5' 3.5" (1.613 m)   Wt 174 lb (78.9 kg)   BMI 30.34 kg/m  Constitutional: generally well-appearing Psychiatric:  alert and oriented x3 Abdomen: soft, nontender, nondistended, no obvious ascites, no peritoneal signs, normal bowel sounds No peripheral edema noted in lower extremities  Assessment and plan: 53 y.o. female with postprandial nausea, chronically elevated lipase  She has had 3 imaging studies 2 of them here in [place] and one at [facility] for abdomen and pelvis that all clearly show that her pancreas looks completely normal.  I do not think she has been having acute or chronic pancreatitis.  I do not think lipase needs to be checked any further.  I cannot adequately explain her lipase but certainly she has lymphadenopathy throughout her abdomen and pelvis from her CLL.  There are definitely lymph nodes around her pancreas and possibly 1 of them is causing some irritation locally in her pancreas.  I do not want that would be possible to prove or disprove.  Either way she certainly does not have acute pancreatitis now I do not think lipase needs to be checked again.  Hard to get a adequate history from her, her story seems to change from sentence to sentence.  She also answers questions in a very vague and non-concrete way.  I do think she is having some postprandial upper GI issues with nausea and sometimes pains.  I recommended upper endoscopy for her to exclude neoplasm, peptic ulcer disease, H. pylori infection.  She for years has been having loose stools every morning.  I recommended that she try taking a single Imodium every morning shortly after waking as first-line symptom control for now.  Please see the "Patient Instructions" section for addition details about the plan.  Owens Loffler, MD [facility] 04/26/2019, 3:17 PM

## 2019-04-27 ENCOUNTER — Ambulatory Visit (INDEPENDENT_AMBULATORY_CARE_PROVIDER_SITE_OTHER): Payer: Medicare Other | Admitting: Psychiatry

## 2019-04-27 DIAGNOSIS — F4310 Post-traumatic stress disorder, unspecified: Secondary | ICD-10-CM | POA: Diagnosis not present

## 2019-04-27 DIAGNOSIS — F3173 Bipolar disorder, in partial remission, most recent episode manic: Secondary | ICD-10-CM | POA: Diagnosis not present

## 2019-05-01 ENCOUNTER — Encounter: Payer: Self-pay | Admitting: Gastroenterology

## 2019-05-02 ENCOUNTER — Encounter: Payer: Self-pay | Admitting: General Practice

## 2019-05-02 ENCOUNTER — Ambulatory Visit (INDEPENDENT_AMBULATORY_CARE_PROVIDER_SITE_OTHER): Payer: Medicare Other | Admitting: Psychiatry

## 2019-05-02 DIAGNOSIS — F4310 Post-traumatic stress disorder, unspecified: Secondary | ICD-10-CM | POA: Diagnosis not present

## 2019-05-02 DIAGNOSIS — F3173 Bipolar disorder, in partial remission, most recent episode manic: Secondary | ICD-10-CM | POA: Diagnosis not present

## 2019-05-02 NOTE — Progress Notes (Signed)
[facility]ual Care Note  Met with Morgan [name] via WebEx as scheduled. She is doing strong work processing anger as a healthy signal that something is wrong, which has been freeing and affirming for her. She seemed emotionally lighter and more energetic today. She will f/u as needed.  [place], [place], [facility] Pager [phone] Voicemail [phone]

## 2019-05-04 ENCOUNTER — Telehealth: Payer: Self-pay

## 2019-05-04 ENCOUNTER — Encounter: Payer: Self-pay | Admitting: General Practice

## 2019-05-04 ENCOUNTER — Ambulatory Visit (INDEPENDENT_AMBULATORY_CARE_PROVIDER_SITE_OTHER): Payer: Medicare Other | Admitting: Psychiatry

## 2019-05-04 DIAGNOSIS — F3173 Bipolar disorder, in partial remission, most recent episode manic: Secondary | ICD-10-CM | POA: Diagnosis not present

## 2019-05-04 DIAGNOSIS — F4310 Post-traumatic stress disorder, unspecified: Secondary | ICD-10-CM

## 2019-05-04 NOTE — Telephone Encounter (Signed)
Covid-19 screening questions   Do you now or have you had a fever in the last 14 days? NO   Do you have any respiratory symptoms of shortness of breath or cough now or in the last 14 days? NO  Do you have any family members or close contacts with diagnosed or suspected Covid-19 in the past 14 days? NO  Have you been tested for Covid-19 and found to be positive? NO        

## 2019-05-04 NOTE — Progress Notes (Signed)
[facility]ual Care Note  Spoke with Morgan [name] by phone per her request to process her recent appt with Dr Ouida Sills, in which she shared hopeful and empowering news, including a job offer. Morgan [name] is feeling more upbeat and divinely supported now than she has through her recent work with healing old trauma.   [place], [place], [facility] Pager [phone] Voicemail [phone]

## 2019-05-05 ENCOUNTER — Other Ambulatory Visit: Payer: Self-pay

## 2019-05-05 ENCOUNTER — Ambulatory Visit (AMBULATORY_SURGERY_CENTER): Payer: Medicare Other | Admitting: Gastroenterology

## 2019-05-05 ENCOUNTER — Encounter: Payer: Self-pay | Admitting: Gastroenterology

## 2019-05-05 VITALS — BP 108/81 | HR 61 | Temp 97.7°F | Resp 15 | Ht 63.0 in | Wt 174.0 lb

## 2019-05-05 DIAGNOSIS — R109 Unspecified abdominal pain: Secondary | ICD-10-CM

## 2019-05-05 DIAGNOSIS — K2950 Unspecified chronic gastritis without bleeding: Secondary | ICD-10-CM

## 2019-05-05 DIAGNOSIS — K3189 Other diseases of stomach and duodenum: Secondary | ICD-10-CM | POA: Diagnosis not present

## 2019-05-05 DIAGNOSIS — R1013 Epigastric pain: Secondary | ICD-10-CM | POA: Diagnosis not present

## 2019-05-05 MED ORDER — SODIUM CHLORIDE 0.9 % IV SOLN: 500.0000 mL | Freq: Once | INTRAVENOUS | Status: DC

## 2019-05-05 NOTE — Op Note (Signed)
[facility] Patient Name: Morgan [name] Procedure Date: 05/05/2019 2:49 PM MRN: [id number] Endoscopist: Milus Banister , MD Age: 53 Referring MD:  Date of Birth: [birth year] Gender: Female Account #: [id number] Procedure:                Upper GI endoscopy Indications:              Abdominal pain Medicines:                Monitored Anesthesia Care Procedure:                Pre-Anesthesia Assessment:                           - Prior to the procedure, a History and Physical                            was performed, and patient medications and                            allergies were reviewed. The patient's tolerance of                            previous anesthesia was also reviewed. The risks                            and benefits of the procedure and the sedation                            options and risks were discussed with the patient.                            All questions were answered, and informed consent                            was obtained. Prior Anticoagulants: The patient has                            taken no previous anticoagulant or antiplatelet                            agents. ASA Grade Assessment: II - A patient with                            mild systemic disease. After reviewing the risks                            and benefits, the patient was deemed in                            satisfactory condition to undergo the procedure.                           After obtaining informed consent, the endoscope was  passed under direct vision. Throughout the                            procedure, the patient's blood pressure, pulse, and                            oxygen saturations were monitored continuously. The                            Endoscope was introduced through the mouth, and                            advanced to the second part of duodenum. The upper                            GI endoscopy was accomplished without  difficulty.                            The patient tolerated the procedure well. Scope In: Scope Out: Findings:                 Minimal inflammation characterized by erythema was                            found in the gastric antrum. Biopsies were taken                            with a cold forceps for histology.                           The exam was otherwise without abnormality. Complications:            No immediate complications. Estimated blood loss:                            None. Estimated Blood Loss:     Estimated blood loss: none. Impression:               - Mild, non-specific distal gastritis. Biopsied to                            check for H. pylori.                           - The examination was otherwise normal. Recommendation:           - Patient has a contact number available for                            emergencies. The signs and symptoms of potential                            delayed complications were discussed with the                            patient. Return to normal activities tomorrow.  Written discharge instructions were provided to the                            patient.                           - Resume previous diet.                           - Continue present medications.                           - Await pathology results. Milus Banister, MD 05/05/2019 3:16:09 PM This report has been signed electronically.

## 2019-05-05 NOTE — Patient Instructions (Signed)
YOU HAD AN ENDOSCOPIC PROCEDURE TODAY AT THE [facility]:   Refer to the procedure report that was given to you for any specific questions about what was found during the examination.  If the procedure report does not answer your questions, please call your gastroenterologist to clarify.  If you requested that your care partner not be given the details of your procedure findings, then the procedure report has been included in a sealed envelope for you to review at your convenience later.  YOU SHOULD EXPECT: Some feelings of bloating in the abdomen. Passage of more gas than usual.  Walking can help get rid of the air that was put into your GI tract during the procedure and reduce the bloating. If you had a lower endoscopy (such as a colonoscopy or flexible sigmoidoscopy) you may notice spotting of blood in your stool or on the toilet paper. If you underwent a bowel prep for your procedure, you may not have a normal bowel movement for a few days.  Please Note:  You might notice some irritation and congestion in your nose or some drainage.  This is from the oxygen used during your procedure.  There is no need for concern and it should clear up in a day or so.  SYMPTOMS TO REPORT IMMEDIATELY:   Following upper endoscopy (EGD)  Vomiting of blood or coffee ground material  New chest pain or pain under the shoulder blades  Painful or persistently difficult swallowing  New shortness of breath  Fever of 100F or higher  Black, tarry-looking stools  For urgent or emergent issues, a gastroenterologist can be reached at any hour by calling [phone].   DIET:  We do recommend a small meal at first, but then you may proceed to your regular diet.  Drink plenty of fluids but you should avoid alcoholic beverages for 24 hours.  ACTIVITY:  You should plan to take it easy for the rest of today and you should NOT DRIVE or use heavy machinery until tomorrow (because of the sedation medicines used  during the test).    FOLLOW UP: Our staff will call the number listed on your records 48-72 hours following your procedure to check on you and address any questions or concerns that you may have regarding the information given to you following your procedure. If we do not reach you, we will leave a message.  We will attempt to reach you two times.  During this call, we will ask if you have developed any symptoms of COVID 19. If you develop any symptoms (ie: fever, flu-like symptoms, shortness of breath, cough etc.) before then, please call [phone].  If you test positive for Covid 19 in the 2 weeks post procedure, please call and report this information to us.    If any biopsies were taken you will be contacted by phone or by letter within the next 1-3 weeks.  Please call us at [phone] if you have not heard about the biopsies in 3 weeks.    SIGNATURES/CONFIDENTIALITY: You and/or your care partner have signed paperwork which will be entered into your electronic medical record.  These signatures attest to the fact that that the information above on your After Visit Summary has been reviewed and is understood.  Full responsibility of the confidentiality of this discharge information lies with you and/or your care-partner. 

## 2019-05-05 NOTE — Progress Notes (Signed)
Called to room to assist during endoscopic procedure.  Patient ID and intended procedure confirmed with present staff. Received instructions for my participation in the procedure from the performing physician.  

## 2019-05-05 NOTE — Progress Notes (Signed)
Temperature taken by J.B., VS taken by C.W. 

## 2019-05-05 NOTE — Progress Notes (Signed)
Report given to PACU, vss 

## 2019-05-09 ENCOUNTER — Telehealth: Payer: Self-pay

## 2019-05-09 ENCOUNTER — Ambulatory Visit (INDEPENDENT_AMBULATORY_CARE_PROVIDER_SITE_OTHER): Payer: Medicare Other | Admitting: Psychiatry

## 2019-05-09 ENCOUNTER — Encounter: Payer: Self-pay | Admitting: General Practice

## 2019-05-09 DIAGNOSIS — F3173 Bipolar disorder, in partial remission, most recent episode manic: Secondary | ICD-10-CM | POA: Diagnosis not present

## 2019-05-09 DIAGNOSIS — F4310 Post-traumatic stress disorder, unspecified: Secondary | ICD-10-CM

## 2019-05-09 NOTE — Telephone Encounter (Signed)
  Follow up Call-  Call back number 05/05/2019  Post procedure Call Back phone  # [phone]  Permission to leave phone message Yes  Some recent data might be hidden     Patient questions:  Do you have a fever, pain , or abdominal swelling? Yes.   Pain Score  2 * States a 6 at times Have you tolerated food without any problems? No.  Have you been able to return to your normal activities? Yes.    Do you have any questions about your discharge instructions: Diet   No. Medications  No. Follow up visit  Yes.    Do you have questions or concerns about your Care? Yes.    Actions: * If pain score is 4 or above: Physician/ provider Notified : Owens Loffler, MD.  Pt has diarrhea more often, states not only in am but also throughout the day now since the procedure.

## 2019-05-09 NOTE — Telephone Encounter (Signed)
[place], thanks.  Will address when her biopsies are back

## 2019-05-10 ENCOUNTER — Encounter (HOSPITAL_BASED_OUTPATIENT_CLINIC_OR_DEPARTMENT_OTHER): Payer: Self-pay

## 2019-05-10 ENCOUNTER — Other Ambulatory Visit: Payer: Self-pay

## 2019-05-10 ENCOUNTER — Encounter: Payer: Self-pay | Admitting: Family Medicine

## 2019-05-10 ENCOUNTER — Ambulatory Visit (INDEPENDENT_AMBULATORY_CARE_PROVIDER_SITE_OTHER): Payer: Medicare Other | Admitting: Family Medicine

## 2019-05-10 ENCOUNTER — Ambulatory Visit (HOSPITAL_BASED_OUTPATIENT_CLINIC_OR_DEPARTMENT_OTHER)
Admission: RE | Admit: 2019-05-10 | Discharge: 2019-05-10 | Disposition: A | Discharge status: Home / Self Care | Payer: Medicare Other | Source: Ambulatory Visit | Attending: Family Medicine | Admitting: Family Medicine

## 2019-05-10 ENCOUNTER — Telehealth (HOSPITAL_COMMUNITY): Payer: Self-pay | Admitting: *Deleted

## 2019-05-10 VITALS — BP 122/80 | HR 79 | Temp 96.5°F | Resp 16 | Ht 63.0 in | Wt 174.0 lb

## 2019-05-10 DIAGNOSIS — G8929 Other chronic pain: Secondary | ICD-10-CM

## 2019-05-10 DIAGNOSIS — R200 Anesthesia of skin: Secondary | ICD-10-CM

## 2019-05-10 DIAGNOSIS — M545 Low back pain, unspecified: Secondary | ICD-10-CM

## 2019-05-10 DIAGNOSIS — M79662 Pain in left lower leg: Secondary | ICD-10-CM

## 2019-05-10 DIAGNOSIS — M79661 Pain in right lower leg: Secondary | ICD-10-CM | POA: Diagnosis not present

## 2019-05-10 LAB — B12 AND FOLATE PANEL
Folate: 8.2 ng/mL (ref >5.9)
Vitamin B-12: 529 pg/mL (ref 211–911)

## 2019-05-10 NOTE — Patient Instructions (Addendum)
Good to see you again today  I will set you up for vascular testing of your legs to check the circulation We will get a B12 and folate level today in the lab Then please go to the ground floor imaging dept for x-rays of your lower back- then you can go home- I will be in touch asap

## 2019-05-10 NOTE — Progress Notes (Signed)
[facility] at [facility], [place], [place] [address] [phone]  Date:  05/10/2019   Name:  Morgan [name]   DOB:  22-Dec-1965   MRN:  [id number]  PCP:  Darreld Mclean, MD    Chief Complaint: Annual Exam   History of Present Illness:  Morgan [name] is a 53 y.o. very pleasant female patient who presents with the following:  Pt with history of CLL dx 07/2016 Her leukemia has been relatively stable, but she has struggled with abdominal pain  Per oncology note from [facility] about one month ago, no indication for current CLL treatment. LAD is stable  She is seeing Dr Ardis Hughs for her abd pain, had an upper GI last week; mild gastritis noted  H pylori is pending  Of note he does NOT think her mild lipase elevation is due to pancreatitis- may be related to abd LAD from her CLL.  We will stop checking lipase  She does have some loose stools still that bother her, but imodium causes constipation so she tries to not use this.  She is just managing her bowel issues right now  Her location of [employer] is closing- she is planning to move over to working at [employer] She plans to give up her disability rating and try to do full duty part time.  It will be financially a good move for her and she hopes it will work-she is a bit apprehensive  She is seeing oncology at [facility] and at [facility]. For now they are not planning any emergent treatment but this will likely be needed at some point - she is trying to get mentally prepared for his and is seeing a chaplain  mammo and pap are UTD Colon 2017 immun UTD - flu shot is done   She continues to notice a lot of pain in her feet and legs bilaterally - she has noted this for maybe 3 months She may get cramping in her calves with walking- ?claudiacation  She also has pain in her lower back Walking a lot may make her worse The bottoms of her feet may feel sore and burn   Patient Active  Problem List   Diagnosis Date Noted   Fever 06/18/2018   Piriformis syndrome of right side 04/22/2017   Lateral epicondylitis of left elbow 04/06/2017   Solitary pulmonary nodule 10/29/2016   Chronic lymphocytic leukemia ([place]) 07/31/2016   Mass in neck 04/17/2016   Abdominal mass 04/17/2016   Asthma 12/12/2015   Multiple environmental allergies 12/12/2015   Chronic lower back pain 09/12/2015   Bipolar 1 disorder, depressed ([place]) 08/27/2015    Class: Chronic   Other specified hypothyroidism 08/23/2015   Borderline personality disorder ([place]) 08/15/2015   Severe benzodiazepine use disorder ([place]) 08/15/2015   Alcohol use disorder, moderate, dependence ([place]) 08/15/2015   PTSD (post-traumatic stress disorder) 08/15/2015   History of bipolar disorder 08/15/2015   Pre-syncope 05/17/2015   Skull deformity 05/17/2015   Patellofemoral syndrome of both knees 05/02/2015    Past Medical History:  Diagnosis Date   Anxiety    Arthritis    Asthma    Bipolar disorder ([place])    Bulging lumbar disc 07/19/05   Chronic headaches    Chronic lymphocytic leukemia ([place]) 07/31/2016   Colon polyps    Degenerative disorder of bone    Depression    History of borderline personality disorder    Hypothyroidism 2007  Developed after use of Lithium   IBS (irritable bowel syndrome) 1995   Pneumonia    Proctitis    Shingles 07/2018   Substance abuse ([place])    ativan last month    Past Surgical History:  Procedure Laterality Date   ABDOMINAL HYSTERECTOMY     APPENDECTOMY     BUNIONECTOMY Right 06/2012   CHOLECYSTECTOMY     OOPHORECTOMY     SHOULDER OPEN ROTATOR CUFF REPAIR Right 03/2010   TUBAL LIGATION      Social History   Tobacco Use   Smoking status: Former Smoker    Quit date: 07/20/1980    Years since quitting: 38.8   Smokeless tobacco: Never Used  Substance Use Topics   Alcohol use: Yes    Alcohol/week: 0.0 standard drinks    Comment:  occ   Drug use: Not Currently    Family History  Problem Relation Age of Onset   Depression Mother    Hypertension Mother    Post-traumatic stress disorder Sister    Alcohol abuse Brother    Drug abuse Brother    Post-traumatic stress disorder Brother    Thyroid disease Father    Alzheimer's disease Father    COPD Maternal Grandmother    Heart disease Maternal Grandfather    Arthritis Paternal Grandmother    Diabetes Paternal Grandmother    Depression Paternal Grandmother    Alzheimer's disease Paternal Grandmother    Heart disease Paternal Grandfather    Coronary artery disease Paternal Grandfather    Alcohol abuse Paternal Grandfather    Colon cancer Neg Hx    Breast cancer Neg Hx     Allergies  Allergen Reactions   Aripiprazole Anaphylaxis and Swelling   Ciprofloxacin Shortness Of Breath   Lamotrigine Rash and Other (See Comments)   Nitrofurantoin Hives   Other Anaphylaxis, Hives and Swelling    Walnuts and pine nuts   Peanut Oil Anaphylaxis, Hives, Swelling and Rash   Amoxicillin Hives and Rash    Has patient had a PCN reaction causing immediate rash, facial/tongue/throat swelling, SOB or lightheadedness with hypotension: Yes Has patient had a PCN reaction causing severe rash involving mucus membranes or skin necrosis: No Has patient had a PCN reaction that required hospitalization: No Has patient had a PCN reaction occurring within the last 10 years: No If all of the above answers are "NO", then may proceed with Cephalosporin use.   Doxycycline Hives and Rash   Latex Rash and Other (See Comments)   Meloxicam Itching and Other (See Comments)    Induces mania Interacts with Lithium   Naproxen Hives and Other (See Comments)    Interacts with lithium   Pramipexole Hives and Rash   Prednisone Other (See Comments)    Interacts with Lithium   Risperidone Hives and Rash   Sulfa Antibiotics Rash    "that leaves scarring"   Tape  Itching and Rash    Steri-Strips   Cortisone Other (See Comments)    Exacerbates the mania of her bipolar   Percocet [Oxycodone-Acetaminophen] Other (See Comments)    Severe dizziness   Shingrix [Zoster Vac Recomb Adjuvanted]     Medication list has been reviewed and updated.  Current Outpatient Medications on File Prior to Visit  Medication Sig Dispense Refill   acetaminophen (TYLENOL) 500 MG tablet Take 1,000 mg by mouth every 6 (six) hours as needed for mild pain.     albuterol (PROVENTIL HFA;VENTOLIN HFA) 108 (90 Base) MCG/ACT inhaler Inhale 1-2 puffs  into the lungs every 6 (six) hours as needed for wheezing or shortness of breath. 1 each 3   diphenhydrAMINE (BENADRYL) 25 MG tablet Take 1 tablet (25 mg total) by mouth every 6 (six) hours as needed for itching. 20 tablet 0   fluticasone furoate-vilanterol (BREO ELLIPTA) 100-25 MCG/INH AEPB Inhale 1 puff into the lungs daily. 1 each 5   hyoscyamine (LEVSIN, ANASPAZ) 0.125 MG tablet Take 1 tablet (0.125 mg total) by mouth as needed for cramping. Reported on 09/24/2015 10 tablet 2   LORazepam (ATIVAN) 0.5 MG tablet Take 1 tablet (0.5 mg total) by mouth every 12 (twelve) hours as needed for anxiety. 30 tablet 1   rizatriptan (MAXALT-MLT) 10 MG disintegrating tablet Take 1 tablet (10 mg total) by mouth as needed for migraine. May repeat in 2 hours if needed 9 tablet 11   UNABLE TO FIND Take by mouth. ELDERBERRY FRUIT ORAL     No current facility-administered medications on file prior to visit.     Review of Systems:  As per HPI- otherwise negative. No fever or chills  Physical Examination: Vitals:   05/10/19 0911  BP: 122/80  Pulse: 79  Resp: 16  Temp: (!) 96.5 F (35.8 C)  SpO2: 98%   Vitals:   05/10/19 0911  Weight: 174 lb (78.9 kg)  Height: 5\' 3"  (1.6 m)   Body mass index is 30.82 kg/m. Ideal Body Weight: Weight in (lb) to have BMI = 25: 140.8  GEN: WDWN, NAD, Non-toxic, A & O x 3, overweight, looks  well HEENT: Atraumatic, Normocephalic. Neck supple. No masses, No LAD.  TM within normal limits Ears and Nose: No external deformity. CV: RRR, No M/G/R. No JVD. No thrill. No extra heart sounds. PULM: CTA B, no wheezes, crackles, rhonchi. No retractions. No resp. distress. No accessory muscle use. ABD: S, NT, ND EXTR: No c/c/e NEURO Normal gait.  PSYCH: Normally interactive. Conversant. Not depressed or anxious appearing.  Calm demeanor.  Calves show normal muscularity bilaterally, no muscular tenderness Feet are well perfused, with palpable pulses bilaterally.  Normal monofilament sensation except for left big toe She notes muscle spasm and tightness in the lumbar muscularity, more so on the right.  Normal bilateral lower extremity strength, sensation, DTR.  Negative straight leg raise  Assessment and Plan: Numbness of toes - Plan: B12 and Folate Panel  Pain in both lower legs - Plan: VAS [place] ABI WITH/WO TBI  Chronic bilateral low back pain without sciatica - Plan: DG Lumbar Spine Complete  Here today for follow-up visit.  Main concern today is pain in both feet and lower legs, maybe worse with walking.  Her symptoms are not classic for claudication, but will rule this out with ABI. This may be a neurologic disorder, check B12 and folate.  She also notes back pain and endorses a history of degenerative spine disease.  Check lumbar films today  Signed Lamar Blinks, MD

## 2019-05-10 NOTE — Progress Notes (Signed)
[place] Spiritual Care Note  [facility]ia [facility] Spiritual Care visit as planned, provided empathic listening, pastoral reflection, and spiritual and emotional support. Because she is working through stressful topics including "watch and wait" approach during exacerbation of symptoms, job change, and healing old trauma, Morgan [name] made the wise suggestion of using fun to help lighten her focus, so we played a round of a word game together. The recreation met a deep spiritual and emotional need for levity through connection and companionship. Phala will f/u with chaplain as needed.   [place], [place], [facility] Pager [phone] Voicemail [phone]

## 2019-05-10 NOTE — Telephone Encounter (Signed)
The above patient or their representative was contacted and gave the following answers to these questions:         Do you have any of the following symptoms?    NO  Fever                    Cough                   Shortness of breath  Do  you have any of the following other symptoms?    muscle pain         vomiting,        diarrhea        rash         weakness        red eye        abdominal pain         bruising          bruising or bleeding              joint pain           severe headache    Have you been in contact with someone who was or has been sick in the past 2 weeks?  NO  Yes                 Unsure                         Unable to assess   Does the person that you were in contact with have any of the following symptoms?   Cough         shortness of breath           muscle pain         vomiting,            diarrhea            rash            weakness           fever            red eye           abdominal pain           bruising  or  bleeding                joint pain                severe headache                 COMMENTS OR ACTION PLAN FOR THIS PATIENT:  Repeat no everything

## 2019-05-11 ENCOUNTER — Ambulatory Visit (INDEPENDENT_AMBULATORY_CARE_PROVIDER_SITE_OTHER): Payer: Medicare Other | Admitting: Psychiatry

## 2019-05-11 ENCOUNTER — Encounter: Payer: Self-pay | Admitting: Gastroenterology

## 2019-05-11 ENCOUNTER — Encounter (HOSPITAL_COMMUNITY): Payer: Medicare Other

## 2019-05-11 DIAGNOSIS — F4310 Post-traumatic stress disorder, unspecified: Secondary | ICD-10-CM

## 2019-05-11 DIAGNOSIS — F3173 Bipolar disorder, in partial remission, most recent episode manic: Secondary | ICD-10-CM | POA: Diagnosis not present

## 2019-05-15 ENCOUNTER — Encounter: Payer: Self-pay | Admitting: Family Medicine

## 2019-05-15 NOTE — Progress Notes (Signed)
Virtual Visit via Video Note  I connected with patient on 05/16/19 at  3:15 PM EDT by audio enabled telemedicine application and verified that I am speaking with the correct person using two identifiers.   THIS ENCOUNTER IS A VIRTUAL VISIT DUE TO COVID-19 - PATIENT WAS NOT SEEN IN THE OFFICE. PATIENT HAS CONSENTED TO VIRTUAL VISIT / TELEMEDICINE VISIT   Location of patient: home  Location of provider: office  I discussed the limitations of evaluation and management by telemedicine and the availability of in person appointments. The patient expressed understanding and agreed to proceed.    Subjective:   Morgan [name] is a 53 y.o. female who presents for Medicare Annual (Subsequent) preventive examination.  Review of Systems:    Home Safety/Smoke Alarms: Feels safe in home. Smoke alarms in place.  Lives in 2nd floor apt. Does well w/ stairs.    Female:   Pap- 04/27/18      Mammo-  01/25/19    CCS- next due 2027     Objective:     Vitals: Unable to assess. This visit is enabled though telemedicine due to Covid 19.  Advanced Directives 05/16/2019 02/26/2019 02/10/2019 10/12/2018 05/27/2018 05/10/2018 05/05/2018  Does Patient Have a Medical Advance Directive? Yes No No Yes Yes Yes Yes  Type of Paramedic of [place];Living will - - [place];Living will [place];Living will [place];Living will [place];Living will  Does patient want to make changes to medical advance directive? No - Patient declined - - - - - -  Copy of [place] in Chart? Yes - validated most recent copy scanned in chart (See row information) - - - - - Yes  Would patient like information on creating a medical advance directive? - Yes (ED - Information included in AVS) - - - - -  Some encounter information is confidential and restricted. Go to Review Flowsheets activity to see all data.     Tobacco Social History   Tobacco Use  Smoking Status Former Smoker   Quit date: 07/20/1980   Years since quitting: 38.8  Smokeless Tobacco Never Used     Counseling given: Not Answered   Clinical Intake: Pain : No/denies pain    Past Medical History:  Diagnosis Date   Anxiety    Arthritis    Asthma    Bipolar disorder ([place])    Bulging lumbar disc 07/19/05   Chronic headaches    Chronic lymphocytic leukemia ([place]) 07/31/2016   Colon polyps    Degenerative disorder of bone    Depression    History of borderline personality disorder    Hypothyroidism 2007   Developed after use of Lithium   IBS (irritable bowel syndrome) 1995   Pneumonia    Proctitis    Shingles 07/2018   Substance abuse ([place])    ativan last month   Past Surgical History:  Procedure Laterality Date   ABDOMINAL HYSTERECTOMY     APPENDECTOMY     BUNIONECTOMY Right 06/2012   CHOLECYSTECTOMY     OOPHORECTOMY     SHOULDER OPEN ROTATOR CUFF REPAIR Right 03/2010   TUBAL LIGATION     Family History  Problem Relation Age of Onset   Depression Mother    Hypertension Mother    Post-traumatic stress disorder Sister    Alcohol abuse Brother    Drug abuse Brother    Post-traumatic stress disorder Brother    Thyroid disease Father  Alzheimer's disease Father    COPD Maternal Grandmother    Heart disease Maternal Grandfather    Arthritis Paternal Grandmother    Diabetes Paternal Grandmother    Depression Paternal Grandmother    Alzheimer's disease Paternal Grandmother    Heart disease Paternal Grandfather    Coronary artery disease Paternal Grandfather    Alcohol abuse Paternal Grandfather    Colon cancer Neg Hx    Breast cancer Neg Hx    Social History   Socioeconomic History   Marital status: Divorced    Spouse name: Not on file   Number of children: 2   Years of education: 14   Highest education level: Not on file  Occupational History    Occupation: [employer]  Scientist, product/process development strain: Not on file   Food insecurity    Worry: Not on file    Inability: Not on file   Transportation needs    Medical: Not on file    Non-medical: Not on file  Tobacco Use   Smoking status: Former Smoker    Quit date: 07/20/1980    Years since quitting: 38.8   Smokeless tobacco: Never Used  Substance and Sexual Activity   Alcohol use: Yes    Alcohol/week: 0.0 standard drinks    Comment: occ   Drug use: Not Currently   Sexual activity: Never    Birth control/protection: Surgical    Comment: intercourse age unknown,sexual partners less than 5  Lifestyle   Physical activity    Days per week: Not on file    Minutes per session: Not on file   Stress: Not on file  Relationships   Social connections    Talks on phone: Not on file    Gets together: Not on file    Attends religious service: Not on file    Active member of club or organization: Not on file    Attends meetings of clubs or organizations: Not on file    Relationship status: Not on file  Other Topics Concern   Not on file  Social History Narrative   Fun: [place], travel, music, writing, walking, hiking, volunteering   Denies religious beliefs effecting health care.    Feels safe at home.    Divorced, Works at [employer].  Children 2.  Lives home alone.    Outpatient Encounter Medications as of 05/16/2019  Medication Sig   acetaminophen (TYLENOL) 500 MG tablet Take 1,000 mg by mouth every 6 (six) hours as needed for mild pain.   albuterol (PROVENTIL HFA;VENTOLIN HFA) 108 (90 Base) MCG/ACT inhaler Inhale 1-2 puffs into the lungs every 6 (six) hours as needed for wheezing or shortness of breath.   diphenhydrAMINE (BENADRYL) 25 MG tablet Take 1 tablet (25 mg total) by mouth every 6 (six) hours as needed for itching.   fluticasone furoate-vilanterol (BREO ELLIPTA) 100-25 MCG/INH AEPB Inhale 1 puff into the lungs daily.    hyoscyamine (LEVSIN, ANASPAZ) 0.125 MG tablet Take 1 tablet (0.125 mg total) by mouth as needed for cramping. Reported on 09/24/2015   LORazepam (ATIVAN) 0.5 MG tablet Take 1 tablet (0.5 mg total) by mouth every 12 (twelve) hours as needed for anxiety.   rizatriptan (MAXALT-MLT) 10 MG disintegrating tablet Take 1 tablet (10 mg total) by mouth as needed for migraine. May repeat in 2 hours if needed   UNABLE TO FIND Take by mouth. ELDERBERRY FRUIT ORAL   No facility-administered encounter medications on file as of  05/16/2019.     Activities of Daily Living No flowsheet data found.  Patient Care Team: Copland, Gay Filler, MD as PCP - General (Family Medicine) Binnie Rail, MD (Internal Medicine)    Assessment:   This is a routine wellness examination for [place]. Physical assessment deferred to PCP.  Exercise Activities and Dietary recommendations   Diet (meal preparation, eat out, water intake, caffeinated beverages, dairy products, fruits and vegetables): well balanced. Tries to eat gluten free.    [place]     patient (pt-stated)     Goal is to be independent with job  To continue your health objectives; diet and exercise        Fall Risk Fall Risk  05/16/2019 05/05/2018 04/19/2017 06/18/2016 12/21/2015  Falls in the past year? 0 No Yes Yes No  Number falls in past yr: 0 - 2 or more - -  Injury with Fall? 0 - Yes Yes -  Follow up - - Education provided;Falls prevention discussed - -    Depression Screen PHQ 2/9 Scores 05/16/2019 05/05/2018 04/19/2017 06/18/2016  PHQ - 2 Score 2 1 0 0  PHQ- 9 Score 7 - - -  Some encounter information is confidential and restricted. Go to Review Flowsheets activity to see all data.     Cognitive Function Ad8 score reviewed for issues:  Issues making decisions:no  Less interest in hobbies / activities:no  Repeats questions, stories (family complaining):no  Trouble using ordinary gadgets  (microwave, computer, phone):no  Forgets the month or year: no  Mismanaging finances: no  Remembering appts:no  Daily problems with thinking and/or memory:no Ad8 score is=0   MMSE - Mini Mental State Exam 10/22/2015  Not completed: (No Data)        Immunization History  Administered Date(s) Administered   Influenza, Quadrivalent, Recombinant, Inj, Pf 04/28/2018   Influenza,inj,Quad PF,6+ Mos 03/20/2019   Influenza-Unspecified 04/15/2015, 05/19/2017   Tdap 04/15/2015   Zoster Recombinat (Shingrix) 02/23/2019     Screening Tests Health Maintenance  Topic Date Due   MAMMOGRAM  01/24/2021   PAP SMEAR-Modifier  04/27/2021   TETANUS/TDAP  04/14/2025   COLONOSCOPY  09/23/2025   INFLUENZA VACCINE  Completed   HIV Screening  Completed      Plan:   See you next year!  Continue to eat heart healthy diet (full of fruits, vegetables, whole grains, lean protein, water--limit salt, fat, and sugar intake) and increase physical activity as tolerated.  Continue doing brain stimulating activities (puzzles, reading, adult coloring books, staying active) to keep memory sharp.    I have personally reviewed and noted the following in the patients chart:    Medical and social history  Use of alcohol, tobacco or illicit drugs   Current medications and supplements  Functional ability and status  Nutritional status  Physical activity  Advanced directives  List of other physicians  Hospitalizations, surgeries, and ER visits in previous 12 months  Vitals  Screenings to include cognitive, depression, and falls  Referrals and appointments  In addition, I have reviewed and discussed with patient certain preventive protocols, quality metrics, and best practice recommendations. A written personalized care plan for preventive services as well as general preventive health recommendations were provided to patient.     Shela Nevin, [place]  05/16/2019

## 2019-05-16 ENCOUNTER — Encounter: Payer: Self-pay | Admitting: *Deleted

## 2019-05-16 ENCOUNTER — Encounter: Payer: Self-pay | Admitting: General Practice

## 2019-05-16 ENCOUNTER — Other Ambulatory Visit: Payer: Self-pay

## 2019-05-16 ENCOUNTER — Ambulatory Visit (INDEPENDENT_AMBULATORY_CARE_PROVIDER_SITE_OTHER): Payer: Medicare Other | Admitting: *Deleted

## 2019-05-16 DIAGNOSIS — Z0000 Encounter for general adult medical examination without abnormal findings: Secondary | ICD-10-CM

## 2019-05-16 NOTE — Progress Notes (Signed)
[place] Spiritual Care Note  Received VM and email from [place]. Returned call. She sounded flat, overwhelmed, and angry today, reporting fatigue at prolonged physical symptoms (and struggle to identify their cause) and prolonged stress of job-change process. She also stated that she had a dream-like experience that stirred up her old trauma in a challenging way. Despite having said that she wanted to discontinue seeing Dr Ouida Sills for counseling, [place] stated that she called Dr Ouida Sills to check in about it. Encouraged her to maintain a therapeutic relationship with her or another provider as an important layer of her mental-health and emotional support. Distinguished between spiritual care and therapy. Pt ended call stating, "I just don't want to do this, talk about this, anymore." Lakiesha usually reaches out regularly, but I plan to phone tomorrow to check in.   [place], [place], [facility] Pager [phone] Voicemail [phone]

## 2019-05-16 NOTE — Patient Instructions (Signed)
See you next year!  Continue to eat heart healthy diet (full of fruits, vegetables, whole grains, lean protein, water--limit salt, fat, and sugar intake) and increase physical activity as tolerated.  Continue doing brain stimulating activities (puzzles, reading, adult coloring books, staying active) to keep memory sharp.    Morgan [name] , Thank you for taking time to come for your Medicare Wellness Visit. I appreciate your ongoing commitment to your health goals. Please review the following plan we discussed and let me know if I can assist you in the future.   These are the goals we discussed: Goals    [place]    . patient (pt-stated)     Goal is to be independent with job  To continue your health objectives; diet and exercise        This is a list of the screening recommended for you and due dates:  Health Maintenance  Topic Date Due  . Mammogram  01/24/2021  . Pap Smear  04/27/2021  . Tetanus Vaccine  04/14/2025  . Colon Cancer Screening  09/23/2025  . Flu Shot  Completed  . HIV Screening  Completed    Health Maintenance, Female Adopting a healthy lifestyle and getting preventive care are important in promoting health and wellness. Ask your health care provider about:  The right schedule for you to have regular tests and exams.  Things you can do on your own to prevent diseases and keep yourself healthy. What should I know about diet, weight, and exercise? Eat a healthy diet   Eat a diet that includes plenty of vegetables, fruits, low-fat dairy products, and lean protein.  Do not eat a lot of foods that are high in solid fats, added sugars, or sodium. Maintain a healthy weight Body mass index (BMI) is used to identify weight problems. It estimates body fat based on height and weight. Your health care provider can help determine your BMI and help you achieve or maintain a healthy weight. Get regular exercise Get regular exercise. This  is one of the most important things you can do for your health. Most adults should:  Exercise for at least 150 minutes each week. The exercise should increase your heart rate and make you sweat (moderate-intensity exercise).  Do strengthening exercises at least twice a week. This is in addition to the moderate-intensity exercise.  Spend less time sitting. Even light physical activity can be beneficial. Watch cholesterol and blood lipids Have your blood tested for lipids and cholesterol at 54 years of age, then have this test every 5 years. Have your cholesterol levels checked more often if:  Your lipid or cholesterol levels are high.  You are older than 54 years of age.  You are at high risk for heart disease. What should I know about cancer screening? Depending on your health history and family history, you may need to have cancer screening at various ages. This may include screening for:  Breast cancer.  Cervical cancer.  Colorectal cancer.  Skin cancer.  Lung cancer. What should I know about heart disease, diabetes, and high blood pressure? Blood pressure and heart disease  High blood pressure causes heart disease and increases the risk of stroke. This is more likely to develop in people who have high blood pressure readings, are of African descent, or are overweight.  Have your blood pressure checked: ? Every 3-5 years if you are 59-61 years of age. ? Every year if  you are 52 years old or older. Diabetes Have regular diabetes screenings. This checks your fasting blood sugar level. Have the screening done:  Once every three years after age 36 if you are at a normal weight and have a low risk for diabetes.  More often and at a younger age if you are overweight or have a high risk for diabetes. What should I know about preventing infection? Hepatitis B If you have a higher risk for hepatitis B, you should be screened for this virus. Talk with your health care provider to  find out if you are at risk for hepatitis B infection. Hepatitis C Testing is recommended for:  Everyone born from [birth year] through 1966.  Anyone with known risk factors for hepatitis C. Sexually transmitted infections (STIs)  Get screened for STIs, including gonorrhea and chlamydia, if: ? You are sexually active and are younger than 54 years of age. ? You are older than 54 years of age and your health care provider tells you that you are at risk for this type of infection. ? Your sexual activity has changed since you were last screened, and you are at increased risk for chlamydia or gonorrhea. Ask your health care provider if you are at risk.  Ask your health care provider about whether you are at high risk for HIV. Your health care provider may recommend a prescription medicine to help prevent HIV infection. If you choose to take medicine to prevent HIV, you should first get tested for HIV. You should then be tested every 3 months for as long as you are taking the medicine. Pregnancy  If you are about to stop having your period (premenopausal) and you may become pregnant, seek counseling before you get pregnant.  Take 400 to 800 micrograms (mcg) of folic acid every day if you become pregnant.  Ask for birth control (contraception) if you want to prevent pregnancy. Osteoporosis and menopause Osteoporosis is a disease in which the bones lose minerals and strength with aging. This can result in bone fractures. If you are 57 years old or older, or if you are at risk for osteoporosis and fractures, ask your health care provider if you should:  Be screened for bone loss.  Take a calcium or vitamin D supplement to lower your risk of fractures.  Be given hormone replacement therapy (HRT) to treat symptoms of menopause. Follow these instructions at home: Lifestyle  Do not use any products that contain nicotine or tobacco, such as cigarettes, e-cigarettes, and chewing tobacco. If you need help  quitting, ask your health care provider.  Do not use street drugs.  Do not share needles.  Ask your health care provider for help if you need support or information about quitting drugs. Alcohol use  Do not drink alcohol if: ? Your health care provider tells you not to drink. ? You are pregnant, may be pregnant, or are planning to become pregnant.  If you drink alcohol: ? Limit how much you use to 0-1 drink a day. ? Limit intake if you are breastfeeding.  Be aware of how much alcohol is in your drink. In the [place] one drink equals one 12 oz bottle of beer (355 mL), one 5 oz glass of wine (148 mL), or one 1 oz glass of hard liquor (44 mL). General instructions  Schedule regular health, dental, and eye exams.  Stay current with your vaccines.  Tell your health care provider if: ? You often feel depressed. ? You have ever  been abused or do not feel safe at home. Summary  Adopting a healthy lifestyle and getting preventive care are important in promoting health and wellness.  Follow your health care provider's instructions about healthy diet, exercising, and getting tested or screened for diseases.  Follow your health care provider's instructions on monitoring your cholesterol and blood pressure. This information is not intended to replace advice given to you by your health care provider. Make sure you discuss any questions you have with your health care provider. Document Released: 01/19/2011 Document Revised: 06/29/2018 Document Reviewed: 06/29/2018 Elsevier Patient Education  2020 [employer].

## 2019-05-18 ENCOUNTER — Ambulatory Visit (INDEPENDENT_AMBULATORY_CARE_PROVIDER_SITE_OTHER): Payer: Medicare Other | Admitting: Psychiatry

## 2019-05-18 ENCOUNTER — Telehealth: Payer: Self-pay | Admitting: Family Medicine

## 2019-05-18 ENCOUNTER — Telehealth (HOSPITAL_COMMUNITY): Payer: Self-pay | Admitting: *Deleted

## 2019-05-18 DIAGNOSIS — F3173 Bipolar disorder, in partial remission, most recent episode manic: Secondary | ICD-10-CM | POA: Diagnosis not present

## 2019-05-18 DIAGNOSIS — F4310 Post-traumatic stress disorder, unspecified: Secondary | ICD-10-CM

## 2019-05-18 NOTE — Telephone Encounter (Signed)
Due to extenuating circumstances patient does not want to proceed with scheduling exam at this time.  I will contact Dr. Janett Billow Copland on her behalf to let them know and will cancel order in our workque.

## 2019-05-18 NOTE — Telephone Encounter (Signed)
fyi

## 2019-05-18 NOTE — Telephone Encounter (Signed)
Juliann Pulse, from cardiovascular imaging, called stating pt has asked her to cancel the order to get her abi checked. Please advise.   [phone]

## 2019-05-22 ENCOUNTER — Encounter: Payer: Self-pay | Admitting: General Practice

## 2019-05-22 NOTE — Progress Notes (Signed)
[place] Note  Had a constructive conversation with Shasmeen re scope and goals of Spiritual Care, scheduling sessions on 11/6 and 11/12 to reduce distress by having supportive time to count on and plan for in the midst of busy work weeks.  [place], [place], [facility] Pager [phone] Voicemail [phone]

## 2019-05-23 ENCOUNTER — Ambulatory Visit: Payer: Medicare Other | Admitting: Psychiatry

## 2019-05-24 ENCOUNTER — Encounter: Payer: Self-pay | Admitting: Family Medicine

## 2019-05-25 ENCOUNTER — Other Ambulatory Visit: Payer: Self-pay

## 2019-05-25 ENCOUNTER — Ambulatory Visit (INDEPENDENT_AMBULATORY_CARE_PROVIDER_SITE_OTHER): Payer: Medicare Other | Admitting: Family Medicine

## 2019-05-25 ENCOUNTER — Other Ambulatory Visit: Payer: Self-pay | Admitting: Family Medicine

## 2019-05-25 ENCOUNTER — Encounter: Payer: Self-pay | Admitting: Family Medicine

## 2019-05-25 ENCOUNTER — Ambulatory Visit: Payer: Medicare Other | Admitting: Psychiatry

## 2019-05-25 DIAGNOSIS — Z20822 Contact with and (suspected) exposure to covid-19: Secondary | ICD-10-CM

## 2019-05-25 DIAGNOSIS — Z20828 Contact with and (suspected) exposure to other viral communicable diseases: Secondary | ICD-10-CM

## 2019-05-25 NOTE — Progress Notes (Signed)
[place] [facility] [address], [place], [place] [address] [id number] [phone]  Date:  05/25/2019   Name:  Morgan [name]   DOB:  1965-08-17   MRN:  [id number]  PCP:  Darreld Mclean, MD    Chief Complaint: No chief complaint on file.   History of Present Illness:  Morgan [name] is a 53 y.o. very pleasant female patient who presents with the following:  Virtual visit today to discuss COVID-19 testing I saw Morgan [name] in the office 2 and half weeks ago with concern of possible claudication and burning in the bottoms of her feet  She has history of CLL diagnosed in 2018 which is currently followed by her oncologist at [facility] as well as [place]  Patient location is home, provider is at office Patient identity confirmed with 2 factors, she gives consent for virtual visit today She recently noted some sx of shortness of breath- occurred 2 days ago.  She had about 3 episodes of SOB that lasted maybe 2 minutes. She exercised by walking as usual.  However when she went to work they were concerned about possible covid sx. She had one episode of SOB yesterday and woke up with a ST yesterday.    No chest pressure or pain Sx not worse with exercise She is coughing some She notes a runny nose a week ago She will get an occasional bloody nose - this is not unusual for her She has noted headaches No body aches  She notes that her airways feel sensitive and she might cough when she breathes in deep No fever or chills  She is working 2 jobs right now    Patient Active Problem List   Diagnosis Date Noted   Fever 06/18/2018   Piriformis syndrome of right side 04/22/2017   Lateral epicondylitis of left elbow 04/06/2017   Solitary pulmonary nodule 10/29/2016   Chronic lymphocytic leukemia ([place]) 07/31/2016   Mass in neck 04/17/2016   Abdominal mass 04/17/2016   Asthma 12/12/2015   Multiple environmental allergies 12/12/2015    Chronic lower back pain 09/12/2015   Bipolar 1 disorder, depressed ([place]) 08/27/2015    Class: Chronic   Other specified hypothyroidism 08/23/2015   Borderline personality disorder ([place]) 08/15/2015   Severe benzodiazepine use disorder ([place]) 08/15/2015   Alcohol use disorder, moderate, dependence ([employer]) 08/15/2015   PTSD (post-traumatic stress disorder) 08/15/2015   History of bipolar disorder 08/15/2015   Pre-syncope 05/17/2015   Skull deformity 05/17/2015   Patellofemoral syndrome of both knees 05/02/2015    Past Medical History:  Diagnosis Date   Anxiety    Arthritis    Asthma    Bipolar disorder ([place])    Bulging lumbar disc 07/19/05   Chronic headaches    Chronic lymphocytic leukemia ([place]) 07/31/2016   Colon polyps    Degenerative disorder of bone    Depression    History of borderline personality disorder    Hypothyroidism 2007   Developed after use of Lithium   IBS (irritable bowel syndrome) 1995   Pneumonia    Proctitis    Shingles 07/2018   Substance abuse ([place])    ativan last month    Past Surgical History:  Procedure Laterality Date   ABDOMINAL HYSTERECTOMY     APPENDECTOMY     BUNIONECTOMY Right 06/2012   CHOLECYSTECTOMY     OOPHORECTOMY     SHOULDER OPEN ROTATOR CUFF REPAIR Right 03/2010   TUBAL LIGATION  Social History   Tobacco Use   Smoking status: Former Smoker    Quit date: 07/20/1980    Years since quitting: 38.8   Smokeless tobacco: Never Used  Substance Use Topics   Alcohol use: Yes    Alcohol/week: 0.0 standard drinks    Comment: occ   Drug use: Not Currently    Family History  Problem Relation Age of Onset   Depression Mother    Hypertension Mother    Post-traumatic stress disorder Sister    Alcohol abuse Brother    Drug abuse Brother    Post-traumatic stress disorder Brother    Thyroid disease Father    Alzheimer's disease Father    COPD Maternal Grandmother    Heart disease  Maternal Grandfather    Arthritis Paternal Grandmother    Diabetes Paternal Grandmother    Depression Paternal Grandmother    Alzheimer's disease Paternal Grandmother    Heart disease Paternal Grandfather    Coronary artery disease Paternal Grandfather    Alcohol abuse Paternal Grandfather    Colon cancer Neg Hx    Breast cancer Neg Hx     Allergies  Allergen Reactions   Aripiprazole Anaphylaxis and Swelling   Ciprofloxacin Shortness Of Breath   Lamotrigine Rash and Other (See Comments)   Nitrofurantoin Hives   Other Anaphylaxis, Hives and Swelling    Walnuts and pine nuts   Peanut Oil Anaphylaxis, Hives, Swelling and Rash   Amoxicillin Hives and Rash    Has patient had a PCN reaction causing immediate rash, facial/tongue/throat swelling, SOB or lightheadedness with hypotension: Yes Has patient had a PCN reaction causing severe rash involving mucus membranes or skin necrosis: No Has patient had a PCN reaction that required hospitalization: No Has patient had a PCN reaction occurring within the last 10 years: No If all of the above answers are "NO", then may proceed with Cephalosporin use.   Doxycycline Hives and Rash   Latex Rash and Other (See Comments)   Meloxicam Itching and Other (See Comments)    Induces mania Interacts with Lithium   Naproxen Hives and Other (See Comments)    Interacts with lithium   Pramipexole Hives and Rash   Prednisone Other (See Comments)    Interacts with Lithium   Risperidone Hives and Rash   Sulfa Antibiotics Rash    "that leaves scarring"   Tape Itching and Rash    Steri-Strips   Cortisone Other (See Comments)    Exacerbates the mania of her bipolar   Percocet [Oxycodone-Acetaminophen] Other (See Comments)    Severe dizziness   Shingrix [Zoster Vac Recomb Adjuvanted]     Medication list has been reviewed and updated.  Current Outpatient Medications on File Prior to Visit  Medication Sig Dispense Refill    acetaminophen (TYLENOL) 500 MG tablet Take 1,000 mg by mouth every 6 (six) hours as needed for mild pain.     albuterol (PROVENTIL HFA;VENTOLIN HFA) 108 (90 Base) MCG/ACT inhaler Inhale 1-2 puffs into the lungs every 6 (six) hours as needed for wheezing or shortness of breath. 1 each 3   diphenhydrAMINE (BENADRYL) 25 MG tablet Take 1 tablet (25 mg total) by mouth every 6 (six) hours as needed for itching. 20 tablet 0   fluticasone furoate-vilanterol (BREO ELLIPTA) 100-25 MCG/INH AEPB Inhale 1 puff into the lungs daily. 1 each 5   hyoscyamine (LEVSIN, ANASPAZ) 0.125 MG tablet Take 1 tablet (0.125 mg total) by mouth as needed for cramping. Reported on 09/24/2015 10 tablet 2  LORazepam (ATIVAN) 0.5 MG tablet Take 1 tablet (0.5 mg total) by mouth every 12 (twelve) hours as needed for anxiety. 30 tablet 1   rizatriptan (MAXALT-MLT) 10 MG disintegrating tablet Take 1 tablet (10 mg total) by mouth as needed for migraine. May repeat in 2 hours if needed 9 tablet 11   UNABLE TO FIND Take by mouth. ELDERBERRY FRUIT ORAL     No current facility-administered medications on file prior to visit.     Review of Systems:  As per HPI- otherwise negative.   Physical Examination: There were no vitals filed for this visit. There were no vitals filed for this visit. There is no height or weight on file to calculate BMI. Ideal Body Weight:    Pt observed over video She looks well, no cough or wheezing noted  She denies any current CP or pressure, feels well now No SOB now but she just got up  Oxygen sat has been ok at home per pt report- yesterday 98% Pulse ok- running approx  She does not check her BP No fever noted   Assessment and Plan: Encounter for screening laboratory testing for COVID-19 virus - Plan: Novel Coronavirus, NAA ([facility])   Virtual visit today to discuss possible COVID-19 symptoms.  Shamikia has noticed some mild cough, headache, and episodic shortness of breath.  Her  shortness of breath is not worse with exercise, she denies any chest pain or shortness of breath.  She feels well at the present time.  Ordered COVID-19 test for her, and explained testing procedure at [name].  I encouraged her to self isolate at home until she has this test result back.  At this time she is not concerned about her shortness of breath symptoms, they seem minor and transient.  I advised her to seek immediate care if she develops any persistent shortness of breath, chest pain or pressure, or symptoms with exercise.  She agrees to do so Signed Lamar Blinks, MD

## 2019-05-26 ENCOUNTER — Encounter: Payer: Self-pay | Admitting: General Practice

## 2019-05-26 NOTE — Progress Notes (Signed)
[place] Spiritual Care Note  Spoke with Daisia by phone, providing spiritual/emotional support, including space to process her fears about covid (test results pending) and reviewing tools for self-care and coping. Continuing to follow for support. As long as her health allows, she plans to check in with chaplain next week.   Please page if circumstances change or immediate needs arise. Thank you!   [place], [place], [facility] Pager [phone] Voicemail [phone]

## 2019-05-27 ENCOUNTER — Encounter: Payer: Self-pay | Admitting: Family Medicine

## 2019-05-27 LAB — NOVEL CORONAVIRUS, NAA: SARS-CoV-2, NAA: NOT DETECTED

## 2019-05-30 ENCOUNTER — Ambulatory Visit (INDEPENDENT_AMBULATORY_CARE_PROVIDER_SITE_OTHER): Payer: Medicare Other | Admitting: Psychiatry

## 2019-05-30 ENCOUNTER — Ambulatory Visit: Payer: Medicare Other | Admitting: Psychiatry

## 2019-05-30 DIAGNOSIS — F4310 Post-traumatic stress disorder, unspecified: Secondary | ICD-10-CM | POA: Diagnosis not present

## 2019-05-30 DIAGNOSIS — F3173 Bipolar disorder, in partial remission, most recent episode manic: Secondary | ICD-10-CM | POA: Diagnosis not present

## 2019-05-31 ENCOUNTER — Encounter: Payer: Self-pay | Admitting: General Practice

## 2019-05-31 NOTE — Progress Notes (Signed)
[place] Spiritual Care Note  Met with Morgan [name] via WebEx as planned. She is working hard to process multiple stressors, including quarantine, and is Arts development officer and [place] to help with coping and meaning-making. Provided empathic listening, pastoral reflection, normalization of feelings, and emotional/spiritual support. We will next connect in the [facility] support group tomorrow, and [name] will reach out to schedule another appointment as needed.   [place], [place], [facility] Pager [phone] Voicemail [phone]

## 2019-06-01 ENCOUNTER — Ambulatory Visit (INDEPENDENT_AMBULATORY_CARE_PROVIDER_SITE_OTHER): Payer: Medicare Other | Admitting: Psychiatry

## 2019-06-01 ENCOUNTER — Ambulatory Visit: Payer: Medicare Other | Admitting: Psychiatry

## 2019-06-01 ENCOUNTER — Encounter: Payer: Self-pay | Admitting: Family Medicine

## 2019-06-01 DIAGNOSIS — F3173 Bipolar disorder, in partial remission, most recent episode manic: Secondary | ICD-10-CM

## 2019-06-01 DIAGNOSIS — F4310 Post-traumatic stress disorder, unspecified: Secondary | ICD-10-CM

## 2019-06-02 ENCOUNTER — Encounter: Payer: Self-pay | Admitting: General Practice

## 2019-06-02 ENCOUNTER — Encounter: Payer: Self-pay | Admitting: Family Medicine

## 2019-06-02 NOTE — Progress Notes (Signed)
[facility] Spiritual Care Note  Provided prayer and empathic listening per Morgan [name]'s request as she processes distress of still not feeling well.   [place], [place], [facility] Pager [phone] Voicemail [phone]

## 2019-06-04 ENCOUNTER — Emergency Department (HOSPITAL_BASED_OUTPATIENT_CLINIC_OR_DEPARTMENT_OTHER): Payer: Medicare Other

## 2019-06-04 ENCOUNTER — Emergency Department (HOSPITAL_BASED_OUTPATIENT_CLINIC_OR_DEPARTMENT_OTHER)
Admission: EM | Admit: 2019-06-04 | Discharge: 2019-06-04 | Disposition: A | Discharge status: Home / Self Care | Payer: Medicare Other | Attending: Emergency Medicine | Admitting: Emergency Medicine

## 2019-06-04 ENCOUNTER — Other Ambulatory Visit: Payer: Self-pay

## 2019-06-04 ENCOUNTER — Encounter (HOSPITAL_BASED_OUTPATIENT_CLINIC_OR_DEPARTMENT_OTHER): Payer: Self-pay | Admitting: *Deleted

## 2019-06-04 DIAGNOSIS — Z91010 Allergy to peanuts: Secondary | ICD-10-CM | POA: Insufficient documentation

## 2019-06-04 DIAGNOSIS — E039 Hypothyroidism, unspecified: Secondary | ICD-10-CM | POA: Insufficient documentation

## 2019-06-04 DIAGNOSIS — R0789 Other chest pain: Secondary | ICD-10-CM | POA: Diagnosis present

## 2019-06-04 DIAGNOSIS — Z87891 Personal history of nicotine dependence: Secondary | ICD-10-CM | POA: Insufficient documentation

## 2019-06-04 DIAGNOSIS — R072 Precordial pain: Secondary | ICD-10-CM | POA: Diagnosis not present

## 2019-06-04 DIAGNOSIS — R05 Cough: Secondary | ICD-10-CM | POA: Insufficient documentation

## 2019-06-04 DIAGNOSIS — R5383 Other fatigue: Secondary | ICD-10-CM | POA: Diagnosis not present

## 2019-06-04 DIAGNOSIS — D72820 Lymphocytosis (symptomatic): Secondary | ICD-10-CM

## 2019-06-04 DIAGNOSIS — Z79899 Other long term (current) drug therapy: Secondary | ICD-10-CM | POA: Diagnosis not present

## 2019-06-04 DIAGNOSIS — Z20828 Contact with and (suspected) exposure to other viral communicable diseases: Secondary | ICD-10-CM | POA: Insufficient documentation

## 2019-06-04 DIAGNOSIS — R059 Cough, unspecified: Secondary | ICD-10-CM

## 2019-06-04 DIAGNOSIS — Z91040 Latex allergy status: Secondary | ICD-10-CM | POA: Insufficient documentation

## 2019-06-04 DIAGNOSIS — R079 Chest pain, unspecified: Secondary | ICD-10-CM | POA: Diagnosis not present

## 2019-06-04 DIAGNOSIS — J45909 Unspecified asthma, uncomplicated: Secondary | ICD-10-CM | POA: Insufficient documentation

## 2019-06-04 LAB — CBC WITH DIFFERENTIAL/PLATELET
Abs Immature Granulocytes: 0 10*3/uL (ref 0.00–0.07)
Band Neutrophils: 1 %
Basophils Absolute: 0 10*3/uL (ref 0.0–0.1)
Basophils Relative: 0 %
Eosinophils Absolute: 0.3 10*3/uL (ref 0.0–0.5)
Eosinophils Relative: 1 %
HCT: 45.7 % (ref 36.0–46.0)
Hemoglobin: 14.7 g/dL (ref 12.0–15.0)
Lymphocytes Relative: 68 %
Lymphs Abs: 17.1 10*3/uL — ABNORMAL HIGH (ref 0.7–4.0)
MCH: 27.4 pg (ref 26.0–34.0)
MCHC: 32.2 g/dL (ref 30.0–36.0)
MCV: 85.1 fL (ref 80.0–100.0)
Monocytes Absolute: 0.5 10*3/uL (ref 0.1–1.0)
Monocytes Relative: 2 %
Neutro Abs: 7.3 10*3/uL (ref 1.7–7.7)
Neutrophils Relative %: 28 %
Platelets: 172 10*3/uL (ref 150–400)
RBC: 5.37 MIL/uL — ABNORMAL HIGH (ref 3.87–5.11)
RDW: 14.3 % (ref 11.5–15.5)
WBC: 25.2 10*3/uL — ABNORMAL HIGH (ref 4.0–10.5)
nRBC: 0 % (ref 0.0–0.2)

## 2019-06-04 LAB — URINALYSIS, ROUTINE W REFLEX MICROSCOPIC
Bilirubin Urine: NEGATIVE
Glucose, UA: NEGATIVE mg/dL
Hgb urine dipstick: NEGATIVE
Ketones, ur: NEGATIVE mg/dL
Leukocytes,Ua: NEGATIVE
Nitrite: NEGATIVE
Protein, ur: NEGATIVE mg/dL
Specific Gravity, Urine: <1.005 — ABNORMAL LOW (ref 1.005–1.030)
pH: 6 (ref 5.0–8.0)

## 2019-06-04 LAB — COMPREHENSIVE METABOLIC PANEL
ALT: 19 U/L (ref 0–44)
AST: 26 U/L (ref 15–41)
Albumin: 4.4 g/dL (ref 3.5–5.0)
Alkaline Phosphatase: 58 U/L (ref 38–126)
Anion gap: 11 (ref 5–15)
BUN: 13 mg/dL (ref 6–20)
CO2: 23 mmol/L (ref 22–32)
Calcium: 9.8 mg/dL (ref 8.9–10.3)
Chloride: 104 mmol/L (ref 98–111)
Creatinine, Ser: 0.98 mg/dL (ref 0.44–1.00)
GFR calc Af Amer: >60 mL/min (ref >60)
GFR calc non Af Amer: >60 mL/min (ref >60)
Glucose, Bld: 95 mg/dL (ref 70–99)
Potassium: 4.2 mmol/L (ref 3.5–5.1)
Sodium: 138 mmol/L (ref 135–145)
Total Bilirubin: 0.6 mg/dL (ref 0.3–1.2)
Total Protein: 6.8 g/dL (ref 6.5–8.1)

## 2019-06-04 LAB — D-DIMER, QUANTITATIVE: D-Dimer, Quant: <0.27 ug/mL-FEU (ref 0.00–0.50)

## 2019-06-04 LAB — TROPONIN I (HIGH SENSITIVITY): Troponin I (High Sensitivity): <2 ng/L (ref <18)

## 2019-06-04 MED ORDER — SODIUM CHLORIDE 0.9 % IV BOLUS
500.0000 mL | Freq: Once | INTRAVENOUS | Status: AC
  Administered 2019-06-04: 500 mL via INTRAVENOUS

## 2019-06-04 NOTE — ED Provider Notes (Signed)
[facility] EMERGENCY DEPARTMENT Provider Note   CSN: [id number] Arrival date & time: 06/04/19  1802     History   Chief Complaint Chief Complaint  Patient presents with  . Chest Pain    HPI Morgan [name] is a 53 y.o. female.     Patient with history of CLL, treatment for this last year now undergoing surveillance presents to the emergency department with complaint of chest pain.  Patient states that over the past 2 weeks she has felt generally unwell.  She has had fatigue, early satiety, cough, shortness of breath and sore throat.  She has had some muscle pains in her legs.  She was tested for coronavirus after onset of symptoms and she tested negative.  She presents today with complaint of chest pain.  Pain is in the middle of her chest.  She has had some radiation to her left arm at times.  She states that she has had similar pains in the past.  No history of hypertension, high cholesterol, diabetes.  Pain is intermittent.  It does not brought on with exertion.  No nausea, vomiting.  She has chronic diarrhea.  She reports having some pains in her left foot but no swelling of her legs or history of blood clots.  She denies fevers or chills.  No lightheadedness or syncope.     Past Medical History:  Diagnosis Date  . Anxiety   . Arthritis   . Asthma   . Bipolar disorder ([place])   . Bulging lumbar disc 07/19/05  . Chronic headaches   . Chronic lymphocytic leukemia ([place]) 07/31/2016  . Colon polyps   . Degenerative disorder of bone   . Depression   . History of borderline personality disorder   . Hypothyroidism 2007   Developed after use of Lithium  . IBS (irritable bowel syndrome) 1995  . Pneumonia   . Proctitis   . Shingles 07/2018  . Substance abuse ([place])    ativan last month    Patient Active Problem List   Diagnosis Date Noted  . Fever 06/18/2018  . Piriformis syndrome of right side 04/22/2017  . Lateral epicondylitis of left elbow 04/06/2017  .  Solitary pulmonary nodule 10/29/2016  . Chronic lymphocytic leukemia ([place]) 07/31/2016  . Mass in neck 04/17/2016  . Abdominal mass 04/17/2016  . Asthma 12/12/2015  . Multiple environmental allergies 12/12/2015  . Chronic lower back pain 09/12/2015  . Bipolar 1 disorder, depressed ([place]) 08/27/2015    Class: Chronic  . Other specified hypothyroidism 08/23/2015  . Borderline personality disorder ([place]) 08/15/2015  . Severe benzodiazepine use disorder ([place]) 08/15/2015  . Alcohol use disorder, moderate, dependence ([place]) 08/15/2015  . PTSD (post-traumatic stress disorder) 08/15/2015  . History of bipolar disorder 08/15/2015  . Pre-syncope 05/17/2015  . Skull deformity 05/17/2015  . Patellofemoral syndrome of both knees 05/02/2015    Past Surgical History:  Procedure Laterality Date  . ABDOMINAL HYSTERECTOMY    . APPENDECTOMY    . BUNIONECTOMY Right 06/2012  . CHOLECYSTECTOMY    . OOPHORECTOMY    . SHOULDER OPEN ROTATOR CUFF REPAIR Right 03/2010  . TUBAL LIGATION       OB History    Gravida  2   Para  2   Term  2   Preterm  0   AB  0   Living  2     SAB  0   TAB  0   Ectopic  0   Multiple  0  Live Births               Home Medications    Prior to Admission medications   Medication Sig Start Date End Date Taking? Authorizing Provider  acetaminophen (TYLENOL) 500 MG tablet Take 1,000 mg by mouth every 6 (six) hours as needed for mild pain.    [provider]  albuterol (PROVENTIL HFA;VENTOLIN HFA) 108 (90 Base) MCG/ACT inhaler Inhale 1-2 puffs into the lungs every 6 (six) hours as needed for wheezing or shortness of breath. 06/17/18   Binnie Rail, MD  diphenhydrAMINE (BENADRYL) 25 MG tablet Take 1 tablet (25 mg total) by mouth every 6 (six) hours as needed for itching. 02/27/19   Horton, Barbette Hair, MD  fluticasone furoate-vilanterol (BREO ELLIPTA) 100-25 MCG/INH AEPB Inhale 1 puff into the lungs daily. 06/17/18   Binnie Rail, MD  hyoscyamine  (LEVSIN, ANASPAZ) 0.125 MG tablet Take 1 tablet (0.125 mg total) by mouth as needed for cramping. Reported on 09/24/2015 07/05/17   Copland, Gay Filler, MD  LORazepam (ATIVAN) 0.5 MG tablet Take 1 tablet (0.5 mg total) by mouth every 12 (twelve) hours as needed for anxiety. 12/09/18   Copland, Gay Filler, MD  rizatriptan (MAXALT-MLT) 10 MG disintegrating tablet Take 1 tablet (10 mg total) by mouth as needed for migraine. May repeat in 2 hours if needed 05/23/18   Penumalli, Earlean Polka, MD  UNABLE TO FIND Take by mouth. ELDERBERRY FRUIT ORAL    [provider]    Family History Family History  Problem Relation Age of Onset  . Depression Mother   . Hypertension Mother   . Post-traumatic stress disorder Sister   . Alcohol abuse Brother   . Drug abuse Brother   . Post-traumatic stress disorder Brother   . Thyroid disease Father   . Alzheimer's disease Father   . COPD Maternal Grandmother   . Heart disease Maternal Grandfather   . Arthritis Paternal Grandmother   . Diabetes Paternal Grandmother   . Depression Paternal Grandmother   . Alzheimer's disease Paternal Grandmother   . Heart disease Paternal Grandfather   . Coronary artery disease Paternal Grandfather   . Alcohol abuse Paternal Grandfather   . Colon cancer Neg Hx   . Breast cancer Neg Hx     Social History Social History   Tobacco Use  . Smoking status: Former Smoker    Quit date: 07/20/1980    Years since quitting: 38.8  . Smokeless tobacco: Never Used  Substance Use Topics  . Alcohol use: Yes    Alcohol/week: 0.0 standard drinks    Comment: occ  . Drug use: Not Currently     Allergies   Aripiprazole, Ciprofloxacin, Lamotrigine, Nitrofurantoin, Other, Peanut oil, Amoxicillin, Doxycycline, Latex, Meloxicam, Naproxen, Pramipexole, Prednisone, Risperidone, Sulfa antibiotics, Tape, Cortisone, Percocet [oxycodone-acetaminophen], and Shingrix [zoster vac recomb adjuvanted]   Review of Systems Review of Systems   Constitutional: Positive for appetite change and fatigue. Negative for diaphoresis and fever.  HENT: Positive for sore throat. Negative for rhinorrhea.   Eyes: Negative for redness.  Respiratory: Positive for cough and shortness of breath.   Cardiovascular: Positive for chest pain. Negative for palpitations and leg swelling.  Gastrointestinal: Positive for diarrhea (chronic). Negative for abdominal pain, nausea and vomiting.  Genitourinary: Negative for dysuria and frequency.  Musculoskeletal: Positive for myalgias. Negative for back pain and neck pain.  Skin: Positive for rash (chronic shingles).  Neurological: Negative for syncope, light-headedness and headaches.  Psychiatric/Behavioral: The patient is not  nervous/anxious.      Physical Exam Updated Vital Signs BP 114/75   Pulse 68   Temp 98.1 F (36.7 C) (Oral)   Resp 17   SpO2 100%   Physical Exam Vitals signs and nursing note reviewed.  Constitutional:      Appearance: She is well-developed. She is not diaphoretic.  HENT:     Head: Normocephalic and atraumatic.     Mouth/Throat:     Mouth: Mucous membranes are not dry.     Comments: No intraoral lesions noted. Eyes:     General:        Right eye: No discharge.        Left eye: No discharge.     Conjunctiva/sclera: Conjunctivae normal.  Neck:     Musculoskeletal: Normal range of motion and neck supple. No muscular tenderness.     Vascular: Normal carotid pulses. No carotid bruit or JVD.     Trachea: Trachea normal. No tracheal deviation.  Cardiovascular:     Rate and Rhythm: Normal rate and regular rhythm.     Pulses: No decreased pulses.     Heart sounds: Normal heart sounds, S1 normal and S2 normal. No murmur.  Pulmonary:     Effort: Pulmonary effort is normal. No respiratory distress.     Breath sounds: Normal breath sounds. No wheezing.  Chest:     Chest wall: No tenderness.  Abdominal:     General: Bowel sounds are normal.     Palpations: Abdomen is soft.      Tenderness: There is no abdominal tenderness. There is no guarding or rebound.  Musculoskeletal: Normal range of motion.  Skin:    General: Skin is warm and dry.     Coloration: Skin is not pale.     Comments: Rash consistent with shingles inferior to R costal margin  Neurological:     Mental Status: She is alert.      ED Treatments / Results  Labs (all labs ordered are listed, but only abnormal results are displayed) Labs Reviewed - No data to display  ED ECG REPORT   Date: 06/04/2019  Rate: 71  Rhythm: normal sinus rhythm  QRS Axis: normal  Intervals: normal  ST/T Wave abnormalities: normal  Conduction Disutrbances:none  Narrative Interpretation:   Old EKG Reviewed: unchanged  I have personally reviewed the EKG tracing and agree with the computerized printout as noted.  Radiology No results found.  Procedures Procedures (including critical care time)  Medications Ordered in ED Medications  sodium chloride 0.9 % bolus 500 mL (has no administration in time range)     Initial Impression / Assessment and Plan / ED Course  I have reviewed the triage vital signs and the nursing notes.  Pertinent labs & imaging results that were available during my care of the patient were reviewed by me and considered in my medical decision making (see chart for details).        Patient seen and examined. Work-up initiated. Fluids ordered.   Vital signs reviewed and are as follows: BP 114/75   Pulse 68   Temp 98.1 F (36.7 C) (Oral)   Resp 17   SpO2 100%   8:58 PM reviewed CBC results with Dr. Rex Kras.  Patient updated on results.  She has televisit with her doctor tomorrow.  I encouraged her to call her oncologist and let them know her blood counts were to see if they have any further recommendations.  Patient would also like to be retested  for coronavirus.  I feel that given her symptoms and underlying comorbidities, this is reasonable.  Testing ordered.  Patient  encourage continued symptomatic treatment at home including rest and good hydration. Encouraged return with worsening including worsening chest pain, shortness of breath, trouble breathing, fevers, new symptoms or other concerns.  Final Clinical Impressions(s) / ED Diagnoses   Final diagnoses:  Precordial pain  Fatigue, unspecified type  Lymphocytosis  Cough   Patient with chest pain, fatigue, cough in setting of CLL.  Recent negative coronavirus test.  Work-up today includes chest x-ray which is normal.  Cardiac work-up including EKG and troponin which were normal.  D-dimer which was negative.  CMP which was normal.  CBC shows leukocytosis with absolute lymphocytosis.  Patient looks like she does not feel well but is nontoxic and in no distress.  Current plan is to have her follow-up with her doctor tomorrow for continued monitoring and I encouraged her to touch base with her oncologist.    ED Discharge Orders    None       Carlisle Cater, PA-C 06/05/19 0008    Little, Wenda Overland, MD 06/05/19 0022

## 2019-06-04 NOTE — ED Triage Notes (Addendum)
Pt reports recurrent mid chest pain over the last few days. States she has been fatigued and sick x 2 weeks. She had a negative covid test.. She took propanolol? with some relief today. She reports hx of leukemia but is not undergoing any current treatment

## 2019-06-04 NOTE — Discharge Instructions (Signed)
Please read and follow all provided instructions.  Your diagnoses today include:  1. Precordial pain   2. Fatigue, unspecified type   3. Lymphocytosis   4. Cough     Tests performed today include:  An EKG of your heart - normal  A chest x-ray - no pneumonia  Cardiac enzymes - a blood test for heart muscle damage  Blood counts and electrolytes - high lymphocytes (17,000)  D-dimer - screening test for a blood clot - was negative  COVID test - pending  Vital signs. See below for your results today.   Medications prescribed:   None Take any prescribed medications only as directed.  Follow-up instructions:  Keep your appointment with Dr. Lorelei Pont tomorrow. Also please tell your oncologist how you have been feeling and let them know what your blood counts are to make sure they do not have any other recommendations.  Return instructions:  SEEK IMMEDIATE MEDICAL ATTENTION IF:  You have severe chest pain, especially if the pain is crushing or pressure-like and spreads to the arms, back, neck, or jaw, or if you have sweating, nausea (feeling sick to your stomach), or shortness of breath. THIS IS AN EMERGENCY. Don't wait to see if the pain will go away. Get medical help at once. Call 911 or 0 (operator). DO NOT drive yourself to the hospital.   Your chest pain gets worse and does not go away with rest.   You have an attack of chest pain lasting longer than usual, despite rest and treatment with the medications your caregiver has prescribed.   You wake from sleep with chest pain or shortness of breath.  You feel dizzy or faint.  You have chest pain not typical of your usual pain for which you originally saw your caregiver.   You have any other emergent concerns regarding your health.  Additional Information: Chest pain comes from many different causes. Your caregiver has diagnosed you as having chest pain that is not specific for one problem, but does not require admission.  You  are at low risk for an acute heart condition or other serious illness.   Your vital signs today were: BP 116/69    Pulse 65    Temp 98.1 F (36.7 C) (Oral)    Resp 17    SpO2 99%  If your blood pressure (BP) was elevated above 135/85 this visit, please have this repeated by your doctor within one month. --------------

## 2019-06-05 ENCOUNTER — Ambulatory Visit (INDEPENDENT_AMBULATORY_CARE_PROVIDER_SITE_OTHER): Payer: Medicare Other | Admitting: Family Medicine

## 2019-06-05 ENCOUNTER — Encounter: Payer: Self-pay | Admitting: Family Medicine

## 2019-06-05 ENCOUNTER — Other Ambulatory Visit: Payer: Self-pay | Admitting: Family Medicine

## 2019-06-05 DIAGNOSIS — R079 Chest pain, unspecified: Secondary | ICD-10-CM | POA: Diagnosis not present

## 2019-06-05 DIAGNOSIS — Z09 Encounter for follow-up examination after completed treatment for conditions other than malignant neoplasm: Secondary | ICD-10-CM | POA: Diagnosis not present

## 2019-06-05 DIAGNOSIS — D72820 Lymphocytosis (symptomatic): Secondary | ICD-10-CM | POA: Diagnosis not present

## 2019-06-05 NOTE — Progress Notes (Signed)
[facility] at [facility] [address] [place], [place] [address] [phone] [id number] [phone]  Date:  06/05/2019   Name:  Morgan [name]   DOB:  [birth year]   MRN:  [id number]  PCP:  Darreld Mclean, MD    Chief Complaint: No chief complaint on file.   History of Present Illness:  Morgan [name] is a 53 y.o. very pleasant female patient who presents with the following:  Virtual visit today due to illness Patient location is home, provider is at office Patient identity confirmed with 2 factors, she gives consent for virtual visit today Negative covid test on 11/5 She was seen in the ER last night with chest pain-she has noted sx of fatigue, cough, SOB, muscle pains  Per ER, she was treated with IV fluids She had a negative chest x-ray, negative EKG and troponin, negative D-dimer.  Metabolic profile normal, CBC with leukocytosis-up from baseline She did message her oncologist this am- at [facility] Dr Zenda Alpers.  She is waiting to hear back from them  She was also retested for COVID-19; this test is pending  She has a cough, chest is heavy, no fever but she does have chills and sweats (normal with her leukemia) Some body aches yesterday No change in her taste or smell senses noted  She has noted some diarrhea but this is not unusual for her Her shingles rash and pain are flaring some on her trunk She is using albuterol but it is not helping her much - she has some on hand and is currently using once a day Asked her to try taking 2 puffs every 6 hours for a couple of days for her chest She does not want any hycodan cough syrup She will try tylenol and if not helpful ibuprofen for her pain -so far she has not used any over-the-counter pain reliever for her chest discomfort  Patient Active Problem List   Diagnosis Date Noted   Fever 06/18/2018   Piriformis syndrome of right side 04/22/2017   Lateral epicondylitis of left elbow 04/06/2017    Solitary pulmonary nodule 10/29/2016   Chronic lymphocytic leukemia ([place]) 07/31/2016   Mass in neck 04/17/2016   Abdominal mass 04/17/2016   Asthma 12/12/2015   Multiple environmental allergies 12/12/2015   Chronic lower back pain 09/12/2015   Bipolar 1 disorder, depressed ([place]) 08/27/2015    Class: Chronic   Other specified hypothyroidism 08/23/2015   Borderline personality disorder ([place]) 08/15/2015   Severe benzodiazepine use disorder ([place]) 08/15/2015   Alcohol use disorder, moderate, dependence ([place]) 08/15/2015   PTSD (post-traumatic stress disorder) 08/15/2015   History of bipolar disorder 08/15/2015   Pre-syncope 05/17/2015   Skull deformity 05/17/2015   Patellofemoral syndrome of both knees 05/02/2015    Past Medical History:  Diagnosis Date   Anxiety    Arthritis    Asthma    Bipolar disorder ([place])    Bulging lumbar disc 07/19/05   Chronic headaches    Chronic lymphocytic leukemia ([place]) 07/31/2016   Colon polyps    Degenerative disorder of bone    Depression    History of borderline personality disorder    Hypothyroidism 2007   Developed after use of Lithium   IBS (irritable bowel syndrome) 1995   Pneumonia    Proctitis    Shingles 07/2018   Substance abuse ([place])    ativan last month    Past Surgical History:  Procedure Laterality Date   ABDOMINAL HYSTERECTOMY  APPENDECTOMY     BUNIONECTOMY Right 06/2012   CHOLECYSTECTOMY     OOPHORECTOMY     SHOULDER OPEN ROTATOR CUFF REPAIR Right 03/2010   TUBAL LIGATION      Social History   Tobacco Use   Smoking status: Former Smoker    Quit date: 07/20/1980    Years since quitting: 38.9   Smokeless tobacco: Never Used  Substance Use Topics   Alcohol use: Yes    Alcohol/week: 0.0 standard drinks    Comment: occ   Drug use: Not Currently    Family History  Problem Relation Age of Onset   Depression Mother    Hypertension Mother    Post-traumatic stress  disorder Sister    Alcohol abuse Brother    Drug abuse Brother    Post-traumatic stress disorder Brother    Thyroid disease Father    Alzheimer's disease Father    COPD Maternal Grandmother    Heart disease Maternal Grandfather    Arthritis Paternal Grandmother    Diabetes Paternal Grandmother    Depression Paternal Grandmother    Alzheimer's disease Paternal Grandmother    Heart disease Paternal Grandfather    Coronary artery disease Paternal Grandfather    Alcohol abuse Paternal Grandfather    Colon cancer Neg Hx    Breast cancer Neg Hx     Allergies  Allergen Reactions   Aripiprazole Anaphylaxis and Swelling   Ciprofloxacin Shortness Of Breath   Lamotrigine Rash and Other (See Comments)   Nitrofurantoin Hives   Other Anaphylaxis, Hives and Swelling    Walnuts and pine nuts   Peanut Oil Anaphylaxis, Hives, Swelling and Rash   Amoxicillin Hives and Rash    Has patient had a PCN reaction causing immediate rash, facial/tongue/throat swelling, SOB or lightheadedness with hypotension: Yes Has patient had a PCN reaction causing severe rash involving mucus membranes or skin necrosis: No Has patient had a PCN reaction that required hospitalization: No Has patient had a PCN reaction occurring within the last 10 years: No If all of the above answers are "NO", then may proceed with Cephalosporin use.   Doxycycline Hives and Rash   Latex Rash and Other (See Comments)   Meloxicam Itching and Other (See Comments)    Induces mania Interacts with Lithium   Naproxen Hives and Other (See Comments)    Interacts with lithium   Pramipexole Hives and Rash   Prednisone Other (See Comments)    Interacts with Lithium   Risperidone Hives and Rash   Sulfa Antibiotics Rash    "that leaves scarring"   Tape Itching and Rash    Steri-Strips   Cortisone Other (See Comments)    Exacerbates the mania of her bipolar   Percocet [Oxycodone-Acetaminophen] Other (See  Comments)    Severe dizziness   Shingrix [Zoster Vac Recomb Adjuvanted]     Medication list has been reviewed and updated.  Current Outpatient Medications on File Prior to Visit  Medication Sig Dispense Refill   acetaminophen (TYLENOL) 500 MG tablet Take 1,000 mg by mouth every 6 (six) hours as needed for mild pain.     albuterol (PROVENTIL HFA;VENTOLIN HFA) 108 (90 Base) MCG/ACT inhaler Inhale 1-2 puffs into the lungs every 6 (six) hours as needed for wheezing or shortness of breath. 1 each 3   diphenhydrAMINE (BENADRYL) 25 MG tablet Take 1 tablet (25 mg total) by mouth every 6 (six) hours as needed for itching. 20 tablet 0   fluticasone furoate-vilanterol (BREO ELLIPTA) 100-25 MCG/INH AEPB  Inhale 1 puff into the lungs daily. 1 each 5   hyoscyamine (LEVSIN, ANASPAZ) 0.125 MG tablet Take 1 tablet (0.125 mg total) by mouth as needed for cramping. Reported on 09/24/2015 10 tablet 2   LORazepam (ATIVAN) 0.5 MG tablet Take 1 tablet (0.5 mg total) by mouth every 12 (twelve) hours as needed for anxiety. 30 tablet 1   rizatriptan (MAXALT-MLT) 10 MG disintegrating tablet Take 1 tablet (10 mg total) by mouth as needed for migraine. May repeat in 2 hours if needed 9 tablet 11   UNABLE TO FIND Take by mouth. ELDERBERRY FRUIT ORAL     No current facility-administered medications on file prior to visit.     Review of Systems:  As per HPI- otherwise negative.   Physical Examination: There were no vitals filed for this visit. There were no vitals filed for this visit. There is no height or weight on file to calculate BMI. Ideal Body Weight:    Her BP looked fine last night Her pulse and oxygen are ok at home- her oxygen is running no lower than 94%- generally 94-99%  BP Readings from Last 3 Encounters:  06/04/19 118/70  05/10/19 122/80  05/05/19 108/81   Pulse Readings from Last 3 Encounters:  06/04/19 66  05/10/19 79  05/05/19 61    Pt observed over video today- she looks  well No cough, shortness of breath, distress is noted Assessment and Plan: [facility] discharge follow-up  Chest pain, unspecified type  Following up today from ER visit yesterday She was seen with symptoms of illness and nonspecific chest pain Troponin and D-dimer negative I suspect her symptoms may be due to either COVID-19 or her chronic leukemia.  We are waiting on her Covid test, and she has contacted her oncologist today.  We will be in touch with her ASAP pending her Covid results.  If this is negative, and if her oncologist feels this is not an oncology issue, I would recommend having her see cardiology for follow-up and possible stress testing  I provided a note for her job  Discussed supportive care  She will keep me closely posted as to her progress  Signed Lamar Blinks, MD

## 2019-06-06 ENCOUNTER — Ambulatory Visit: Payer: Medicare Other | Admitting: Psychiatry

## 2019-06-06 ENCOUNTER — Ambulatory Visit (INDEPENDENT_AMBULATORY_CARE_PROVIDER_SITE_OTHER): Payer: Medicare Other | Admitting: Psychiatry

## 2019-06-06 DIAGNOSIS — F3173 Bipolar disorder, in partial remission, most recent episode manic: Secondary | ICD-10-CM

## 2019-06-06 DIAGNOSIS — F4310 Post-traumatic stress disorder, unspecified: Secondary | ICD-10-CM

## 2019-06-06 LAB — NOVEL CORONAVIRUS, NAA (HOSP ORDER, SEND-OUT TO REF LAB; TAT 18-24 HRS): SARS-CoV-2, NAA: NOT DETECTED

## 2019-06-06 LAB — PATHOLOGIST SMEAR REVIEW

## 2019-06-08 ENCOUNTER — Ambulatory Visit: Payer: Medicare Other | Admitting: Psychiatry

## 2019-06-13 ENCOUNTER — Ambulatory Visit (INDEPENDENT_AMBULATORY_CARE_PROVIDER_SITE_OTHER): Payer: Medicare Other | Admitting: Psychiatry

## 2019-06-13 ENCOUNTER — Ambulatory Visit: Payer: Medicare Other | Admitting: Psychiatry

## 2019-06-13 DIAGNOSIS — F3173 Bipolar disorder, in partial remission, most recent episode manic: Secondary | ICD-10-CM

## 2019-06-13 DIAGNOSIS — F4310 Post-traumatic stress disorder, unspecified: Secondary | ICD-10-CM

## 2019-06-20 ENCOUNTER — Ambulatory Visit (INDEPENDENT_AMBULATORY_CARE_PROVIDER_SITE_OTHER): Payer: Medicare Other | Admitting: Psychiatry

## 2019-06-20 ENCOUNTER — Ambulatory Visit: Payer: Medicare Other | Admitting: Psychiatry

## 2019-06-20 DIAGNOSIS — F4310 Post-traumatic stress disorder, unspecified: Secondary | ICD-10-CM

## 2019-06-20 DIAGNOSIS — F3173 Bipolar disorder, in partial remission, most recent episode manic: Secondary | ICD-10-CM

## 2019-06-22 ENCOUNTER — Ambulatory Visit (INDEPENDENT_AMBULATORY_CARE_PROVIDER_SITE_OTHER): Payer: Medicare Other | Admitting: Psychiatry

## 2019-06-22 ENCOUNTER — Ambulatory Visit: Payer: Medicare Other | Admitting: Psychiatry

## 2019-06-22 DIAGNOSIS — F3173 Bipolar disorder, in partial remission, most recent episode manic: Secondary | ICD-10-CM

## 2019-06-22 DIAGNOSIS — F4310 Post-traumatic stress disorder, unspecified: Secondary | ICD-10-CM

## 2019-06-26 ENCOUNTER — Encounter: Payer: Self-pay | Admitting: General Practice

## 2019-06-26 NOTE — Progress Notes (Signed)
[facility] Spiritual Care Note  Had Spiritual Care Virtual Visit with [place], during which she processed sources of grief and hope/equanimity. As is common in Spiritual Care and similar sessions, what came out (rather than what she planned to share) was what probably needed most attention. Catharsis and being heard brought some relief and opportunity to re-center. Will follow up again when she requests.   Tom Bean, [place], [facility] Pager [phone] Voicemail [phone]

## 2019-06-27 ENCOUNTER — Ambulatory Visit: Payer: Medicare Other | Admitting: Psychiatry

## 2019-06-27 ENCOUNTER — Ambulatory Visit (INDEPENDENT_AMBULATORY_CARE_PROVIDER_SITE_OTHER): Payer: Medicare Other | Admitting: Psychiatry

## 2019-06-27 DIAGNOSIS — F4310 Post-traumatic stress disorder, unspecified: Secondary | ICD-10-CM

## 2019-06-27 DIAGNOSIS — F3173 Bipolar disorder, in partial remission, most recent episode manic: Secondary | ICD-10-CM

## 2019-06-29 ENCOUNTER — Ambulatory Visit: Payer: Medicare Other | Admitting: Psychiatry

## 2019-06-29 ENCOUNTER — Ambulatory Visit (INDEPENDENT_AMBULATORY_CARE_PROVIDER_SITE_OTHER): Payer: Medicare Other | Admitting: Psychiatry

## 2019-06-29 DIAGNOSIS — C9110 Chronic lymphocytic leukemia of B-cell type not having achieved remission: Secondary | ICD-10-CM | POA: Diagnosis not present

## 2019-06-29 DIAGNOSIS — F3173 Bipolar disorder, in partial remission, most recent episode manic: Secondary | ICD-10-CM

## 2019-06-29 DIAGNOSIS — R202 Paresthesia of skin: Secondary | ICD-10-CM | POA: Diagnosis not present

## 2019-06-29 DIAGNOSIS — F4310 Post-traumatic stress disorder, unspecified: Secondary | ICD-10-CM | POA: Diagnosis not present

## 2019-06-29 DIAGNOSIS — R200 Anesthesia of skin: Secondary | ICD-10-CM | POA: Diagnosis not present

## 2019-06-29 DIAGNOSIS — Z9221 Personal history of antineoplastic chemotherapy: Secondary | ICD-10-CM | POA: Diagnosis not present

## 2019-06-29 DIAGNOSIS — R252 Cramp and spasm: Secondary | ICD-10-CM | POA: Diagnosis not present

## 2019-07-03 ENCOUNTER — Encounter: Payer: Self-pay | Admitting: Family Medicine

## 2019-07-03 ENCOUNTER — Ambulatory Visit (INDEPENDENT_AMBULATORY_CARE_PROVIDER_SITE_OTHER): Payer: Medicare Other | Admitting: Family Medicine

## 2019-07-03 ENCOUNTER — Other Ambulatory Visit: Payer: Self-pay

## 2019-07-03 VITALS — BP 124/80 | HR 72 | Temp 96.5°F | Resp 16 | Ht 63.0 in | Wt 172.0 lb

## 2019-07-03 DIAGNOSIS — R21 Rash and other nonspecific skin eruption: Secondary | ICD-10-CM | POA: Diagnosis not present

## 2019-07-03 DIAGNOSIS — R799 Abnormal finding of blood chemistry, unspecified: Secondary | ICD-10-CM | POA: Diagnosis not present

## 2019-07-03 DIAGNOSIS — R590 Localized enlarged lymph nodes: Secondary | ICD-10-CM | POA: Diagnosis not present

## 2019-07-03 DIAGNOSIS — M2550 Pain in unspecified joint: Secondary | ICD-10-CM | POA: Diagnosis not present

## 2019-07-03 NOTE — Patient Instructions (Signed)
It was good to see you again today- take care and I will be in touch with your labs asap We will check your BUN/ renal function again I am going to do auto-immune labs for you today; if these are suggestive we can refer you to rheumatology If all ok, let's have you see neurology  Take care, I am sorry that you are having this pain  Best JC

## 2019-07-03 NOTE — Progress Notes (Addendum)
[facility] [address], [place], [place] [address] [phone]  Date:  07/03/2019   Name:  Morgan [name]   DOB:  1965/10/24   MRN:  [id number]  PCP:  Darreld Mclean, MD    Chief Complaint: Extremity Pain (feet and hand pain, swelling, arthritis? on going, worse when laying down) and Rash   History of Present Illness:  Morgan [name] is a 53 y.o. very pleasant female patient who presents with the following:  Patient with history of CLL, hypothyroidism, mood disorder Here today with concern of foot and hand pain This has been going on for some time, I last saw her regarding this issue I believe in October.  At that point we got B12 and folate levels, and also ordered films and ABI testing; however she did not end up getting his imaging studies done that I can see  She was seen at [facility] hematology last week for leg cramps, numbness and tingling CMP looked fine, wbc count high  Otherwise the note from this visit may not yet be complete Patient notes that her BUN was at the upper limit of normal at that visit, this is not typical for her.  She is somewhat worried about this  She notes that she is having pain in her feet, hands, sometimes her elbows and knees The pain seems to be worse at night when she is laying down and can wake her up at night  Sometimes also seems worse with walking  She tries more comfortable shoes and compression socks, she wondered if walking around her job at [employer] was causing her pain  She is actually resigned her job at [employer], her last day is in about a week  She has tried ibuprofen, tylenol Lithium OTC daily   Suggested gabapentin but this tends to upset her bipolar disorder  She is drinking plenty of water  [place] also notes that her lymph nodes are bothering her, she cannot seem to lose weight, she sometimes does not sleep well She also has concern of a rash that she will  notice intermittently on her thighs, especially after shower.  She also notes her anal area may appear red sometimes, though it is not itchy or otherwise bothersome  Wt Readings from Last 3 Encounters:  07/03/19 172 lb (78 kg)  05/10/19 174 lb (78.9 kg)  05/05/19 174 lb (78.9 kg)     Patient Active Problem List   Diagnosis Date Noted  . Fever 06/18/2018  . Piriformis syndrome of right side 04/22/2017  . Lateral epicondylitis of left elbow 04/06/2017  . Solitary pulmonary nodule 10/29/2016  . Chronic lymphocytic leukemia ([place]) 07/31/2016  . Mass in neck 04/17/2016  . Abdominal mass 04/17/2016  . Asthma 12/12/2015  . Multiple environmental allergies 12/12/2015  . Chronic lower back pain 09/12/2015  . Bipolar 1 disorder, depressed ([place]) 08/27/2015    Class: Chronic  . Other specified hypothyroidism 08/23/2015  . Borderline personality disorder ([place]) 08/15/2015  . Severe benzodiazepine use disorder ([place]) 08/15/2015  . Alcohol use disorder, moderate, dependence ([place]) 08/15/2015  . PTSD (post-traumatic stress disorder) 08/15/2015  . History of bipolar disorder 08/15/2015  . Pre-syncope 05/17/2015  . Skull deformity 05/17/2015  . Patellofemoral syndrome of both knees 05/02/2015    Past Medical History:  Diagnosis Date  . Anxiety   . Arthritis   . Asthma   . Bipolar disorder ([place])   . Bulging lumbar disc  07/19/05  . Chronic headaches   . Chronic lymphocytic leukemia ([place]) 07/31/2016  . Colon polyps   . Degenerative disorder of bone   . Depression   . History of borderline personality disorder   . Hypothyroidism 2007   Developed after use of Lithium  . IBS (irritable bowel syndrome) 1995  . Pneumonia   . Proctitis   . Shingles 07/2018  . Substance abuse ([place])    ativan last month    Past Surgical History:  Procedure Laterality Date  . ABDOMINAL HYSTERECTOMY    . APPENDECTOMY    . BUNIONECTOMY Right 06/2012  . CHOLECYSTECTOMY    . OOPHORECTOMY    . SHOULDER OPEN  ROTATOR CUFF REPAIR Right 03/2010  . TUBAL LIGATION      Social History   Tobacco Use  . Smoking status: Former Smoker    Quit date: 07/20/1980    Years since quitting: 38.9  . Smokeless tobacco: Never Used  Substance Use Topics  . Alcohol use: Yes    Alcohol/week: 0.0 standard drinks    Comment: occ  . Drug use: Not Currently    Family History  Problem Relation Age of Onset  . Depression Mother   . Hypertension Mother   . Post-traumatic stress disorder Sister   . Alcohol abuse Brother   . Drug abuse Brother   . Post-traumatic stress disorder Brother   . Thyroid disease Father   . Alzheimer's disease Father   . COPD Maternal Grandmother   . Heart disease Maternal Grandfather   . Arthritis Paternal Grandmother   . Diabetes Paternal Grandmother   . Depression Paternal Grandmother   . Alzheimer's disease Paternal Grandmother   . Heart disease Paternal Grandfather   . Coronary artery disease Paternal Grandfather   . Alcohol abuse Paternal Grandfather   . Colon cancer Neg Hx   . Breast cancer Neg Hx     Allergies  Allergen Reactions  . Aripiprazole Anaphylaxis and Swelling  . Ciprofloxacin Shortness Of Breath  . Lamotrigine Rash and Other (See Comments)  . Nitrofurantoin Hives  . Other Anaphylaxis, Hives and Swelling    Walnuts and pine nuts  . Peanut Oil Anaphylaxis, Hives, Swelling and Rash  . Amoxicillin Hives and Rash    Has patient had a PCN reaction causing immediate rash, facial/tongue/throat swelling, SOB or lightheadedness with hypotension: Yes Has patient had a PCN reaction causing severe rash involving mucus membranes or skin necrosis: No Has patient had a PCN reaction that required hospitalization: No Has patient had a PCN reaction occurring within the last 10 years: No If all of the above answers are "NO", then may proceed with Cephalosporin use.  Marland Kitchen Doxycycline Hives and Rash  . Latex Rash and Other (See Comments)  . Meloxicam Itching and Other (See  Comments)    Induces mania Interacts with Lithium  . Naproxen Hives and Other (See Comments)    Interacts with lithium  . Pramipexole Hives and Rash  . Prednisone Other (See Comments)    Interacts with Lithium  . Risperidone Hives and Rash  . Sulfa Antibiotics Rash    "that leaves scarring"  . Tape Itching and Rash    Steri-StripsT  . Cortisone Other (See Comments)    Exacerbates the mania of her bipolar  . Percocet [Oxycodone-Acetaminophen] Other (See Comments)    Severe dizziness  . Shingrix [Zoster Vac Recomb Adjuvanted]     Medication list has been reviewed and updated.  Current Outpatient Medications on File Prior to  Visit  Medication Sig Dispense Refill  . acetaminophen (TYLENOL) 500 MG tablet Take 1,000 mg by mouth every 6 (six) hours as needed for mild pain.    Marland Kitchen albuterol (PROVENTIL HFA;VENTOLIN HFA) 108 (90 Base) MCG/ACT inhaler Inhale 1-2 puffs into the lungs every 6 (six) hours as needed for wheezing or shortness of breath. 1 each 3  . diphenhydrAMINE (BENADRYL) 25 MG tablet Take 1 tablet (25 mg total) by mouth every 6 (six) hours as needed for itching. 20 tablet 0  . fluticasone furoate-vilanterol (BREO ELLIPTA) 100-25 MCG/INH AEPB Inhale 1 puff into the lungs daily. 1 each 5  . hyoscyamine (LEVSIN, ANASPAZ) 0.125 MG tablet Take 1 tablet (0.125 mg total) by mouth as needed for cramping. Reported on 09/24/2015 10 tablet 2  . LORazepam (ATIVAN) 0.5 MG tablet Take 1 tablet (0.5 mg total) by mouth every 12 (twelve) hours as needed for anxiety. 30 tablet 1  . rizatriptan (MAXALT-MLT) 10 MG disintegrating tablet Take 1 tablet (10 mg total) by mouth as needed for migraine. May repeat in 2 hours if needed 9 tablet 11  . UNABLE TO FIND Take by mouth. ELDERBERRY FRUIT ORAL    . UNABLE TO FIND Med Name: Lithium 4.8 mg one daily     No current facility-administered medications on file prior to visit.    Review of Systems:  As per HPI- otherwise negative.  No fever or  chills Physical Examination: Vitals:   07/03/19 1357  BP: 124/80  Pulse: 72  Resp: 16  Temp: (!) 96.5 F (35.8 C)  SpO2: 98%   Vitals:   07/03/19 1357  Weight: 172 lb (78 kg)  Height: 5\' 3"  (1.6 m)   Body mass index is 30.47 kg/m. Ideal Body Weight: Weight in (lb) to have BMI = 25: 140.8  GEN: WDWN, NAD, Non-toxic, A & O x 3, overweight, appears her normal self HEENT: Atraumatic, Normocephalic. Neck supple. No masses, stable cervical and occipital lymphadenopathy Ears and Nose: No external deformity. CV: RRR, No M/G/R. No JVD. No thrill. No extra heart sounds. PULM: CTA B, no wheezes, crackles, rhonchi. No retractions. No resp. distress. No accessory muscle use. ABD: S, NT, ND, +BS. No rebound. No HSM. EXTR: No c/c/e NEURO Normal gait.  PSYCH: Normally interactive. Conversant. Not depressed or anxious appearing.  Calm demeanor.  Examination of both hands and both feet appears normal, some mild osteoarthritis is evident in her hands typical for age There is currently no apparent rash on her legs, or in the anal area   Assessment and Plan: Pain in joint, multiple sites - Plan: sed rate, C-reactive protein, ANA, Rheumatoid Factor, Cyclic citrul peptide antibody, IgG ([facility])  Elevated BUN - Plan: Basic metabolic panel  Rash and nonspecific skin eruption  LAD (lymphadenopathy), cervical  Here today with a few concerns She notes pain in both hands, both feet, and sometimes other joints which has been present for several months and seems to be getting worse We will do autoimmune labs today, if these are negative plan to have her see her neurologist for possible neuropathy symptoms Off her gabapentin, but she has not done well with this medication in the past  Recheck BUN  At this point I do not see an apparent rash, offered reassurance  Cervical lymphadenopathy which is stable from her leukemia  Will plan further follow- up pending labs.  This visit occurred during the  SARS-CoV-2 public health emergency.  Safety protocols were in place, including screening questions prior to the  visit, additional usage of staff PPE, and extensive cleaning of exam room while observing appropriate contact time as indicated for disinfecting solutions.    Signed Lamar Blinks, MD  Received her labs, message to pt 12/15  Results for orders placed or performed in visit on 07/03/19  sed rate  Result Value Ref Range   Sed Rate 1 0 - 30 mm/hr  C-reactive protein  Result Value Ref Range   CRP <1.0 0.5 - 20.0 mg/dL  Rheumatoid Factor  Result Value Ref Range   Rhuematoid fact SerPl-aCnc <14 <14 IU/mL  Basic metabolic panel  Result Value Ref Range   Sodium 141 135 - 145 mEq/L   Potassium 4.3 3.5 - 5.1 mEq/L   Chloride 107 96 - 112 mEq/L   CO2 26 19 - 32 mEq/L   Glucose, Bld 88 70 - 99 mg/dL   BUN 15 6 - 23 mg/dL   Creatinine, Ser 1.12 0.40 - 1.20 mg/dL   GFR 50.60 (L) >60.00 mL/min   Calcium 9.5 8.4 - 10.5 mg/dL

## 2019-07-04 ENCOUNTER — Encounter: Payer: Self-pay | Admitting: Family Medicine

## 2019-07-04 ENCOUNTER — Ambulatory Visit (INDEPENDENT_AMBULATORY_CARE_PROVIDER_SITE_OTHER): Payer: Medicare Other | Admitting: Psychiatry

## 2019-07-04 ENCOUNTER — Ambulatory Visit: Payer: Medicare Other | Admitting: Psychiatry

## 2019-07-04 DIAGNOSIS — F3173 Bipolar disorder, in partial remission, most recent episode manic: Secondary | ICD-10-CM

## 2019-07-04 DIAGNOSIS — M2550 Pain in unspecified joint: Secondary | ICD-10-CM

## 2019-07-04 DIAGNOSIS — F4310 Post-traumatic stress disorder, unspecified: Secondary | ICD-10-CM

## 2019-07-04 LAB — BASIC METABOLIC PANEL
BUN: 15 mg/dL (ref 6–23)
CO2: 26 mEq/L (ref 19–32)
Calcium: 9.5 mg/dL (ref 8.4–10.5)
Chloride: 107 mEq/L (ref 96–112)
Creatinine, Ser: 1.12 mg/dL (ref 0.40–1.20)
GFR: 50.6 mL/min — ABNORMAL LOW (ref >60.00)
Glucose, Bld: 88 mg/dL (ref 70–99)
Potassium: 4.3 mEq/L (ref 3.5–5.1)
Sodium: 141 mEq/L (ref 135–145)

## 2019-07-04 LAB — C-REACTIVE PROTEIN: CRP: <1 mg/dL (ref 0.5–20.0)

## 2019-07-04 LAB — SEDIMENTATION RATE: Sed Rate: 1 mm/hr (ref 0–30)

## 2019-07-06 ENCOUNTER — Ambulatory Visit: Payer: Medicare Other | Admitting: Psychiatry

## 2019-07-06 ENCOUNTER — Ambulatory Visit (INDEPENDENT_AMBULATORY_CARE_PROVIDER_SITE_OTHER): Payer: Medicare Other | Admitting: Psychiatry

## 2019-07-06 DIAGNOSIS — F4310 Post-traumatic stress disorder, unspecified: Secondary | ICD-10-CM

## 2019-07-06 DIAGNOSIS — F3173 Bipolar disorder, in partial remission, most recent episode manic: Secondary | ICD-10-CM

## 2019-07-06 LAB — CYCLIC CITRUL PEPTIDE ANTIBODY, IGG: Cyclic Citrullin Peptide Ab: <16 UNITS

## 2019-07-06 LAB — ANA: Anti Nuclear Antibody (ANA): NEGATIVE

## 2019-07-06 LAB — RHEUMATOID FACTOR: Rheumatoid fact SerPl-aCnc: <14 IU/mL (ref <14)

## 2019-07-11 ENCOUNTER — Ambulatory Visit (INDEPENDENT_AMBULATORY_CARE_PROVIDER_SITE_OTHER): Payer: Medicare Other | Admitting: Psychiatry

## 2019-07-11 ENCOUNTER — Ambulatory Visit: Payer: Medicare Other | Admitting: Psychiatry

## 2019-07-11 DIAGNOSIS — F3173 Bipolar disorder, in partial remission, most recent episode manic: Secondary | ICD-10-CM

## 2019-07-11 DIAGNOSIS — F4310 Post-traumatic stress disorder, unspecified: Secondary | ICD-10-CM

## 2019-07-13 ENCOUNTER — Ambulatory Visit: Payer: Medicare Other | Admitting: Psychiatry

## 2019-07-18 ENCOUNTER — Ambulatory Visit: Payer: Medicare Other | Admitting: Psychiatry

## 2019-07-18 ENCOUNTER — Ambulatory Visit (INDEPENDENT_AMBULATORY_CARE_PROVIDER_SITE_OTHER): Payer: Medicare Other | Admitting: Psychiatry

## 2019-07-18 DIAGNOSIS — F3173 Bipolar disorder, in partial remission, most recent episode manic: Secondary | ICD-10-CM

## 2019-07-20 ENCOUNTER — Ambulatory Visit: Payer: Medicare Other | Admitting: Psychiatry

## 2019-07-25 ENCOUNTER — Ambulatory Visit: Payer: Medicare Other | Admitting: Psychiatry

## 2019-07-25 ENCOUNTER — Institutional Professional Consult (permissible substitution): Payer: Medicare Other | Admitting: Diagnostic Neuroimaging

## 2019-07-26 ENCOUNTER — Ambulatory Visit: Payer: Medicare Other | Admitting: Diagnostic Neuroimaging

## 2019-07-26 ENCOUNTER — Other Ambulatory Visit: Payer: Self-pay

## 2019-07-26 ENCOUNTER — Encounter: Payer: Self-pay | Admitting: Diagnostic Neuroimaging

## 2019-07-26 VITALS — BP 115/68 | HR 76 | Temp 97.0°F | Ht 63.5 in | Wt 172.0 lb

## 2019-07-26 DIAGNOSIS — R200 Anesthesia of skin: Secondary | ICD-10-CM

## 2019-07-26 NOTE — Progress Notes (Signed)
[facility]  PATIENT: Morgan [name] DOB: 1966-04-18  REFERRING CLINICIAN: [place] HISTORY FROM: patient  REASON FOR VISIT: new consult    HISTORICAL  CHIEF COMPLAINT:  Chief Complaint  Patient presents with  . Pain in hands/feet    rm 7 "neuropathy in feet since Aug 2020, woke me in middle of night; now in hands"    HISTORY OF PRESENT ILLNESS:   UPDATE (07/26/19, VRP): Since last visit, doing well with HA on rizatriptan. Symptoms are improved. Now with new onset numbness and weakness in feet and hands starting in Aug 2020. (started in feet; then moved to hands).   PRIOR HPI (05/23/18): 54 year old female with history of CLL, bipolar disorder, IBS, here for evaluation of headaches.  Patient has history of migraine headaches since age 73 years old with left-sided throbbing severe headaches with nausea, photophobia, phonophobia, triggered by sunlight.  She was on Imitrex in the past which helped.  Her last major migraine headache was in June 2016.  In 2017 patient developed new type of "weird headache".  These headaches consist of occipital and whole head throbbing sensation, nausea, vomiting, some restlessness, numbness in the face, foggy thinking, blurred vision.  These are different than her prior migraine headaches.  Patient also has recent diagnosis of CLL, currently not on treatment due to exacerbation of bipolar symptoms with CLL treatment.  Patient also has hypothyroidism, not currently on treatment because patient unable to afford medications.  Recent TSH was greater than 8.  Patient has had clusters of headache in May and October 2019.  Patient has had more than 5-6 headaches in the last 1 month.   REVIEW OF SYSTEMS: Full 14 system review of systems performed and negative with exception of: as per HPI.  ALLERGIES: Allergies  Allergen Reactions  . Aripiprazole Anaphylaxis and Swelling  . Ciprofloxacin Shortness Of Breath  . Lamotrigine Rash and Other  (See Comments)  . Nitrofurantoin Hives  . Other Anaphylaxis, Hives and Swelling    Walnuts and pine nuts  . Peanut Oil Anaphylaxis, Hives, Swelling and Rash  . Amoxicillin Hives and Rash    Has patient had a PCN reaction causing immediate rash, facial/tongue/throat swelling, SOB or lightheadedness with hypotension: Yes Has patient had a PCN reaction causing severe rash involving mucus membranes or skin necrosis: No Has patient had a PCN reaction that required hospitalization: No Has patient had a PCN reaction occurring within the last 10 years: No If all of the above answers are "NO", then may proceed with Cephalosporin use.  Marland Kitchen Doxycycline Hives and Rash  . Latex Rash and Other (See Comments)  . Meloxicam Itching and Other (See Comments)    Induces mania Interacts with Lithium  . Naproxen Hives and Other (See Comments)    Interacts with lithium  . Pramipexole Hives and Rash  . Prednisone Other (See Comments)    Interacts with Lithium  . Risperidone Hives and Rash  . Sulfa Antibiotics Rash    "that leaves scarring"  . Tape Itching and Rash    Steri-StripsT  . Cortisone Other (See Comments)    Exacerbates the mania of her bipolar  . Percocet [Oxycodone-Acetaminophen] Other (See Comments)    Severe dizziness  . Shingrix [Zoster Vac Recomb Adjuvanted] Swelling    rash    HOME MEDICATIONS: Outpatient Medications Prior to Visit  Medication Sig Dispense Refill  . acetaminophen (TYLENOL) 500 MG tablet Take 1,000 mg by mouth every 6 (six) hours as needed for mild pain.    Marland Kitchen  albuterol (PROVENTIL HFA;VENTOLIN HFA) 108 (90 Base) MCG/ACT inhaler Inhale 1-2 puffs into the lungs every 6 (six) hours as needed for wheezing or shortness of breath. 1 each 3  . diphenhydrAMINE (BENADRYL) 25 MG tablet Take 1 tablet (25 mg total) by mouth every 6 (six) hours as needed for itching. 20 tablet 0  . fluticasone furoate-vilanterol (BREO ELLIPTA) 100-25 MCG/INH AEPB Inhale 1 puff into the lungs daily. 1  each 5  . hyoscyamine (LEVSIN, ANASPAZ) 0.125 MG tablet Take 1 tablet (0.125 mg total) by mouth as needed for cramping. Reported on 09/24/2015 10 tablet 2  . LORazepam (ATIVAN) 0.5 MG tablet Take 1 tablet (0.5 mg total) by mouth every 12 (twelve) hours as needed for anxiety. 30 tablet 1  . rizatriptan (MAXALT-MLT) 10 MG disintegrating tablet Take 1 tablet (10 mg total) by mouth as needed for migraine. May repeat in 2 hours if needed 9 tablet 11  . UNABLE TO FIND Take by mouth. ELDERBERRY FRUIT ORAL    . UNABLE TO FIND Med Name: Lithium 4.8 mg one daily     No facility-administered medications prior to visit.    PAST MEDICAL HISTORY: Past Medical History:  Diagnosis Date  . Anxiety   . Arthritis   . Asthma   . Bipolar disorder ([place])   . Bulging lumbar disc 07/19/05  . Chronic headaches   . Chronic lymphocytic leukemia ([place]) 07/31/2016  . Colon polyps   . Degenerative disorder of bone   . Depression   . History of borderline personality disorder   . Hypothyroidism 2007   Developed after use of Lithium  . IBS (irritable bowel syndrome) 1995  . Pneumonia   . Proctitis   . Shingles 07/2018  . Substance abuse ([place])    ativan last month    PAST SURGICAL HISTORY: Past Surgical History:  Procedure Laterality Date  . ABDOMINAL HYSTERECTOMY    . APPENDECTOMY    . BUNIONECTOMY Right 06/2012  . CHOLECYSTECTOMY    . OOPHORECTOMY    . SHOULDER OPEN ROTATOR CUFF REPAIR Right 03/2010  . TUBAL LIGATION      FAMILY HISTORY: Family History  Problem Relation Age of Onset  . Depression Mother   . Hypertension Mother   . Post-traumatic stress disorder Sister   . Alcohol abuse Brother   . Drug abuse Brother   . Post-traumatic stress disorder Brother   . Thyroid disease Father   . Alzheimer's disease Father   . COPD Maternal Grandmother   . Heart disease Maternal Grandfather   . Arthritis Paternal Grandmother   . Diabetes Paternal Grandmother   . Depression Paternal Grandmother   .  Alzheimer's disease Paternal Grandmother   . Heart disease Paternal Grandfather   . Coronary artery disease Paternal Grandfather   . Alcohol abuse Paternal Grandfather   . Colon cancer Neg Hx   . Breast cancer Neg Hx     SOCIAL HISTORY: Social History   Socioeconomic History  . Marital status: Divorced    Spouse name: Not on file  . Number of children: 2  . Years of education: 85  . Highest education level: Not on file  Occupational History    Comment: [employer]  Tobacco Use  . Smoking status: Former Smoker    Quit date: 07/20/1980    Years since quitting: 39.0  . Smokeless tobacco: Never Used  Substance and Sexual Activity  . Alcohol use: Yes    Alcohol/week: 0.0 standard drinks    Comment: occ  .  Drug use: Not Currently  . Sexual activity: Never    Birth control/protection: Surgical    Comment: intercourse age unknown,sexual partners less than 5  Other Topics Concern  . Not on file  Social History Narrative   Fun: [place], travel, music, writing, walking, hiking, volunteering   Denies religious beliefs effecting health care.    Feels safe at home.    Divorced,   Children 2.  Lives home alone.   Social Determinants of Health   Financial Resource Strain:   . Difficulty of Paying Living Expenses: Not on file  Food Insecurity:   . Worried About Charity fundraiser in the Last Year: Not on file  . Ran Out of Food in the Last Year: Not on file  Transportation Needs:   . Lack of Transportation (Medical): Not on file  . Lack of Transportation (Non-Medical): Not on file  Physical Activity:   . Days of Exercise per Week: Not on file  . Minutes of Exercise per Session: Not on file  Stress:   . Feeling of Stress : Not on file  Social Connections:   . Frequency of Communication with Friends and Family: Not on file  . Frequency of Social Gatherings with Friends and Family: Not on file  . Attends Religious Services: Not on file  . Active Member of Clubs or Organizations: Not on  file  . Attends Archivist Meetings: Not on file  . Marital Status: Not on file  Intimate Partner Violence:   . Fear of Current or Ex-Partner: Not on file  . Emotionally Abused: Not on file  . Physically Abused: Not on file  . Sexually Abused: Not on file     PHYSICAL EXAM  GENERAL EXAM/CONSTITUTIONAL: Vitals:  Vitals:   07/26/19 1055  BP: 115/68  Pulse: 76  Temp: (!) 97 F (36.1 C)  Weight: 172 lb (78 kg)  Height: 5' 3.5" (1.613 m)   Body mass index is 29.99 kg/m. Wt Readings from Last 3 Encounters:  07/26/19 172 lb (78 kg)  07/03/19 172 lb (78 kg)  05/10/19 174 lb (78.9 kg)    Patient is in no distress; well developed, nourished and groomed; neck is supple  CARDIOVASCULAR:  Examination of carotid arteries is normal; no carotid bruits  Regular rate and rhythm, no murmurs  Examination of peripheral vascular system by observation and palpation is normal  EYES:  Ophthalmoscopic exam of optic discs and posterior segments is normal; no papilledema or hemorrhages No exam data present  MUSCULOSKELETAL:  Gait, strength, tone, movements noted in Neurologic exam below  NEUROLOGIC: MENTAL STATUS:  MMSE - [place] Exam 10/22/2015  Not completed: (No Data)    awake, alert, oriented to person, place and time  recent and remote memory intact  normal attention and concentration  language fluent, comprehension intact, naming intact  fund of knowledge appropriate  CRANIAL NERVE:   2nd - no papilledema on fundoscopic exam  2nd, 3rd, 4th, 6th - pupils equal and reactive to light, visual fields full to confrontation, extraocular muscles intact, no nystagmus  5th - facial sensation symmetric  7th - facial strength symmetric  8th - hearing intact  9th - palate elevates symmetrically, uvula midline  11th - shoulder shrug symmetric  12th - tongue protrusion midline  MOTOR:   normal bulk and tone, full strength in the BUE, BLE  SENSORY:    normal and symmetric to light touch, temperature, vibration  COORDINATION:   finger-nose-finger, fine finger movements  normal  REFLEXES:   deep tendon reflexes TRACE and symmetric  GAIT/STATION:   narrow based gait     DIAGNOSTIC DATA (LABS, IMAGING, TESTING) - I reviewed patient records, labs, notes, testing and imaging myself where available.  Lab Results  Component Value Date   WBC 25.2 (H) 06/04/2019   HGB 14.7 06/04/2019   HCT 45.7 06/04/2019   MCV 85.1 06/04/2019   PLT 172 06/04/2019      Component Value Date/Time   NA 141 07/03/2019 1436   NA 142 03/10/2017 0954   NA 142 09/04/2016 0754   K 4.3 07/03/2019 1436   K 4.2 03/10/2017 0954   K 4.0 09/04/2016 0754   CL 107 07/03/2019 1436   CL 111 (H) 03/10/2017 0954   CO2 26 07/03/2019 1436   CO2 28 03/10/2017 0954   CO2 25 09/04/2016 0754   GLUCOSE 88 07/03/2019 1436   GLUCOSE 83 03/10/2017 0954   BUN 15 07/03/2019 1436   BUN 11 03/10/2017 0954   BUN 13.4 09/04/2016 0754   CREATININE 1.12 07/03/2019 1436   CREATININE 0.87 10/12/2018 0832   CREATININE 1.1 03/10/2017 0954   CREATININE 0.8 09/04/2016 0754   CALCIUM 9.5 07/03/2019 1436   CALCIUM 9.6 03/10/2017 0954   CALCIUM 9.7 09/04/2016 0754   PROT 6.8 06/04/2019 1835   PROT 6.3 03/10/2017 0954   PROT 6.8 03/10/2017 0954   PROT 6.6 09/04/2016 0754   ALBUMIN 4.4 06/04/2019 1835   ALBUMIN 3.7 03/10/2017 0954   ALBUMIN 4.0 09/04/2016 0754   AST 26 06/04/2019 1835   AST 22 10/12/2018 0832   AST 20 09/04/2016 0754   ALT 19 06/04/2019 1835   ALT 20 10/12/2018 0832   ALT 20 03/10/2017 0954   ALT 14 09/04/2016 0754   ALKPHOS 58 06/04/2019 1835   ALKPHOS 46 03/10/2017 0954   ALKPHOS 54 09/04/2016 0754   BILITOT 0.6 06/04/2019 1835   BILITOT 0.4 10/12/2018 0832   BILITOT 0.39 09/04/2016 0754   GFRNONAA >60 06/04/2019 1835   GFRNONAA >60 10/12/2018 0832   GFRAA >60 06/04/2019 1835   GFRAA >60 10/12/2018 0832   Lab Results  Component Value Date     CHOL 241 (H) 04/27/2018   HDL 104.30 04/27/2018   LDLCALC 103 (H) 04/27/2018   TRIG 171.0 (H) 04/27/2018   CHOLHDL 2 04/27/2018   Lab Results  Component Value Date   HGBA1C 5.5 02/16/2019   Lab Results  Component Value Date   VITAMINB12 529 05/10/2019   Lab Results  Component Value Date   TSH 2.80 03/01/2019    05/10/18 CT head [I reviewed images myself and agree with interpretation. -VRP]  1. No evidence of acute intracranial abnormality. 2. 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 3. Minimal chronic paranasal sinusitis.    ASSESSMENT AND PLAN  54 y.o. year old female here with history of migraine with aura, now with new type of headache in the last 2 years.  Could represent change in migraine versus other secondary headache.  We will proceed with further work-up and treatment.   Dx:  No diagnosis found.   PLAN:  NUMBNESS / PAIN (? Neuropathy) - check labs and EMG/NCS - consider gabapentin, duloxetine  migraine with aura - continue rizatriptan 46m as needed for breakthrough headache; may repeat x 1 after 2 hours; max 2 tabs per day or 8 per month  HYPOTHYROIDISM - resume thyroid replacement  CLL - resume treatment options per heme-onc  Orders Placed This  Encounter  Procedures  . TSH  . Multiple Myeloma Panel (SPEP&IFE w/QIG)  . ANCA panel GNA  . NCV with EMG(electromyography)   Return for for NCV/EMG.    Penni Bombard, MD 01/24/6753, 49:20 PM Certified in Neurology, Neurophysiology and Neuroimaging  [facility] [address], [place], [place] [address] [phone]

## 2019-07-27 ENCOUNTER — Ambulatory Visit (INDEPENDENT_AMBULATORY_CARE_PROVIDER_SITE_OTHER): Payer: Medicare Other | Admitting: Psychiatry

## 2019-07-27 DIAGNOSIS — F3173 Bipolar disorder, in partial remission, most recent episode manic: Secondary | ICD-10-CM

## 2019-07-27 DIAGNOSIS — F4310 Post-traumatic stress disorder, unspecified: Secondary | ICD-10-CM

## 2019-07-28 LAB — MULTIPLE MYELOMA PANEL, SERUM
Albumin SerPl Elph-Mcnc: 4.2 g/dL (ref 2.9–4.4)
Albumin/Glob SerPl: 1.8 — ABNORMAL HIGH (ref 0.7–1.7)
Alpha 1: 0.2 g/dL (ref 0.0–0.4)
Alpha2 Glob SerPl Elph-Mcnc: 0.8 g/dL (ref 0.4–1.0)
B-Globulin SerPl Elph-Mcnc: 1 g/dL (ref 0.7–1.3)
Gamma Glob SerPl Elph-Mcnc: 0.4 g/dL (ref 0.4–1.8)
Globulin, Total: 2.4 g/dL (ref 2.2–3.9)
IgA/Immunoglobulin A, Serum: 12 mg/dL — ABNORMAL LOW (ref 87–352)
IgG (Immunoglobin G), Serum: 491 mg/dL — ABNORMAL LOW (ref 586–1602)
IgM (Immunoglobulin M), Srm: 6 mg/dL — ABNORMAL LOW (ref 26–217)
Total Protein: 6.6 g/dL (ref 6.0–8.5)

## 2019-07-28 LAB — PAN-ANCA
ANCA Proteinase 3: <3.5 U/mL (ref 0.0–3.5)
Atypical pANCA: <1:20 {titer}
C-ANCA: <1:20 {titer}
Myeloperoxidase Ab: <9 U/mL (ref 0.0–9.0)
P-ANCA: <1:20 {titer}

## 2019-07-28 LAB — TSH: TSH: 3.22 u[IU]/mL (ref 0.450–4.500)

## 2019-07-31 DIAGNOSIS — K589 Irritable bowel syndrome without diarrhea: Secondary | ICD-10-CM | POA: Diagnosis not present

## 2019-07-31 DIAGNOSIS — J45909 Unspecified asthma, uncomplicated: Secondary | ICD-10-CM | POA: Diagnosis not present

## 2019-07-31 DIAGNOSIS — G629 Polyneuropathy, unspecified: Secondary | ICD-10-CM | POA: Diagnosis not present

## 2019-07-31 DIAGNOSIS — C9110 Chronic lymphocytic leukemia of B-cell type not having achieved remission: Secondary | ICD-10-CM | POA: Diagnosis not present

## 2019-07-31 DIAGNOSIS — I493 Ventricular premature depolarization: Secondary | ICD-10-CM | POA: Diagnosis not present

## 2019-07-31 DIAGNOSIS — Z87891 Personal history of nicotine dependence: Secondary | ICD-10-CM | POA: Diagnosis not present

## 2019-07-31 DIAGNOSIS — R5383 Other fatigue: Secondary | ICD-10-CM | POA: Diagnosis not present

## 2019-08-01 ENCOUNTER — Ambulatory Visit: Payer: Medicare Other | Admitting: Psychiatry

## 2019-08-03 ENCOUNTER — Ambulatory Visit (INDEPENDENT_AMBULATORY_CARE_PROVIDER_SITE_OTHER): Payer: Medicare Other | Admitting: Psychiatry

## 2019-08-03 ENCOUNTER — Ambulatory Visit: Payer: Medicare Other | Admitting: Psychiatry

## 2019-08-03 DIAGNOSIS — F4310 Post-traumatic stress disorder, unspecified: Secondary | ICD-10-CM

## 2019-08-03 DIAGNOSIS — F3173 Bipolar disorder, in partial remission, most recent episode manic: Secondary | ICD-10-CM

## 2019-08-07 ENCOUNTER — Encounter: Payer: Self-pay | Admitting: General Practice

## 2019-08-07 NOTE — Progress Notes (Signed)
[place] Spiritual Care Note  Made space by phone for [facility] to share and process distress related to upcoming PET scan. She is doing brave, healing work as she reflects on fear, change, and hope, both within and beyond herself. Also mailed a handwritten note of encouragement.   [place], [place], [facility] Pager [phone] Voicemail [phone]

## 2019-08-08 ENCOUNTER — Ambulatory Visit: Payer: Medicare Other | Admitting: Psychiatry

## 2019-08-08 ENCOUNTER — Telehealth: Payer: Self-pay | Admitting: *Deleted

## 2019-08-09 DIAGNOSIS — C9110 Chronic lymphocytic leukemia of B-cell type not having achieved remission: Secondary | ICD-10-CM | POA: Diagnosis not present

## 2019-08-09 DIAGNOSIS — R911 Solitary pulmonary nodule: Secondary | ICD-10-CM | POA: Diagnosis not present

## 2019-08-10 ENCOUNTER — Ambulatory Visit (INDEPENDENT_AMBULATORY_CARE_PROVIDER_SITE_OTHER): Payer: Medicare Other | Admitting: Psychiatry

## 2019-08-10 ENCOUNTER — Encounter: Payer: Self-pay | Admitting: General Practice

## 2019-08-10 DIAGNOSIS — F3173 Bipolar disorder, in partial remission, most recent episode manic: Secondary | ICD-10-CM

## 2019-08-10 DIAGNOSIS — F4310 Post-traumatic stress disorder, unspecified: Secondary | ICD-10-CM

## 2019-08-10 NOTE — Progress Notes (Signed)
[place] Spiritual Care Note  Met with Jaclyn Shaggy via Webex for Spiritual Care Virtual Visit as planned. She used the opportunity well to share and process about her recent PET scan experience and associated feelings. We scheduled a follow-up appt for next week to speak about the results, which she should hear before then.  [place], [place], [facility] Pager [phone] Voicemail [phone]

## 2019-08-14 DIAGNOSIS — C9110 Chronic lymphocytic leukemia of B-cell type not having achieved remission: Secondary | ICD-10-CM | POA: Diagnosis not present

## 2019-08-15 ENCOUNTER — Ambulatory Visit: Payer: Medicare Other | Admitting: Psychiatry

## 2019-08-15 NOTE — Telephone Encounter (Signed)
LVM informing patient that her abs are ok; Dr Leta Baptist recommends for low immunoglobulin levels to be reviewed with heme/oncologist.He advised they are not likely related to her numbness. Left # for questions.

## 2019-08-17 ENCOUNTER — Ambulatory Visit (INDEPENDENT_AMBULATORY_CARE_PROVIDER_SITE_OTHER): Payer: Medicare Other | Admitting: Psychiatry

## 2019-08-17 DIAGNOSIS — F3173 Bipolar disorder, in partial remission, most recent episode manic: Secondary | ICD-10-CM

## 2019-08-17 DIAGNOSIS — F4310 Post-traumatic stress disorder, unspecified: Secondary | ICD-10-CM

## 2019-08-24 ENCOUNTER — Ambulatory Visit: Payer: Medicare Other | Admitting: Psychiatry

## 2019-08-24 IMAGING — CT CT CHEST W/ CM
4 of 10 series · 14 of 36 positions shown, 15 images · IV contrast (iopamidol)
Comparison: 09/05/2016 CT chest, abdomen and pelvis.

CLINICAL DATA: CLL/SLL.  Restaging.

EXAM:
CT CHEST, ABDOMEN, AND PELVIS WITH CONTRAST
TECHNIQUE: Multidetector CT imaging of the chest, abdomen and pelvis was
performed following the standard protocol during bolus
administration of intravenous contrast.
CONTRAST:  100mL PB1ELS-GBB IOPAMIDOL (PB1ELS-GBB) INJECTION 61%

[Series 2: cap with 2 · axial · 0.75mm/px · z∈[+150,+464]mm · 3 of 127 slices shown, 4 images]
[im 32/127  mediastinal]
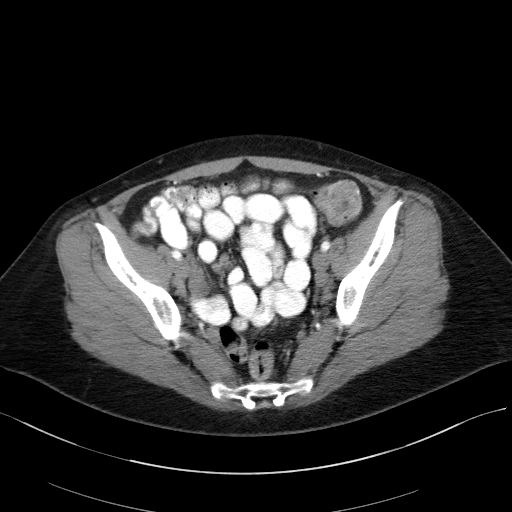
[im 32/127  lung]
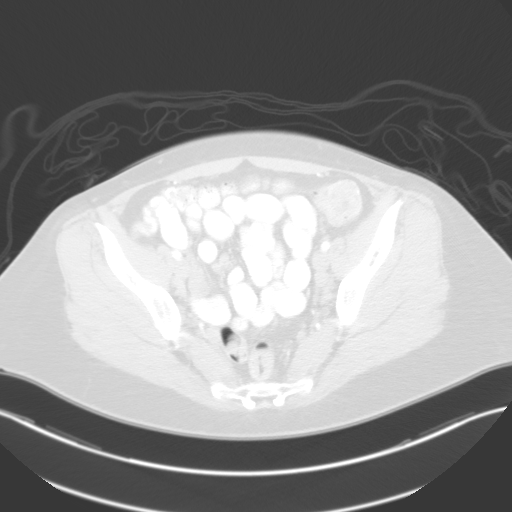
[im 64/127  lung]
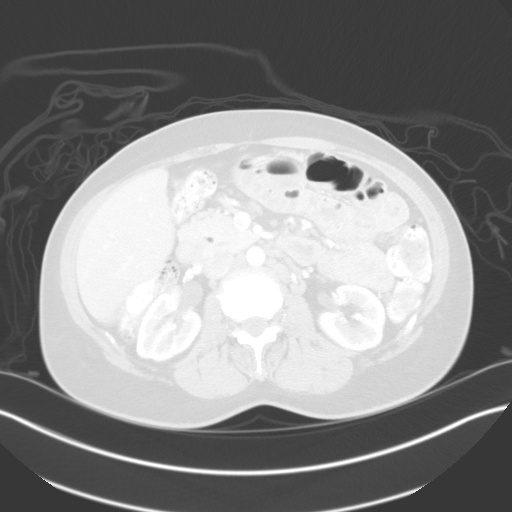
[im 95/127  lung]
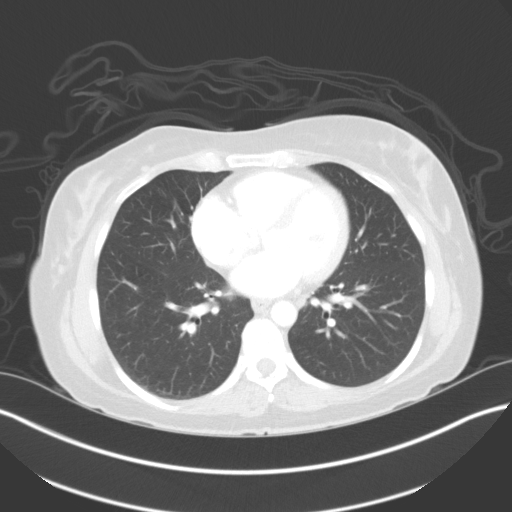

[Series 4: lung · axial · 0.75mm/px · z∈[+390,+576]mm · 5 of 141 slices shown]
[im 24/141  lung]
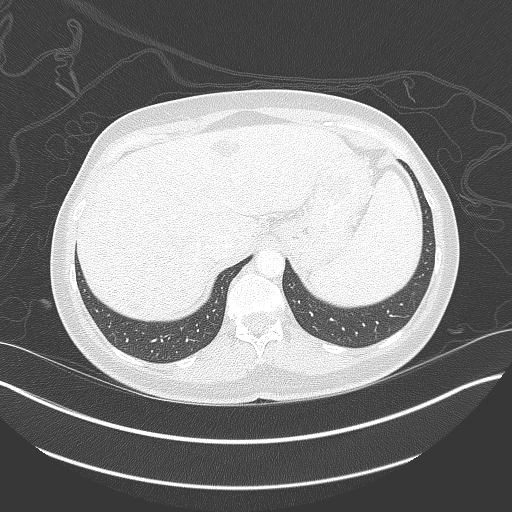
[im 47/141  lung]
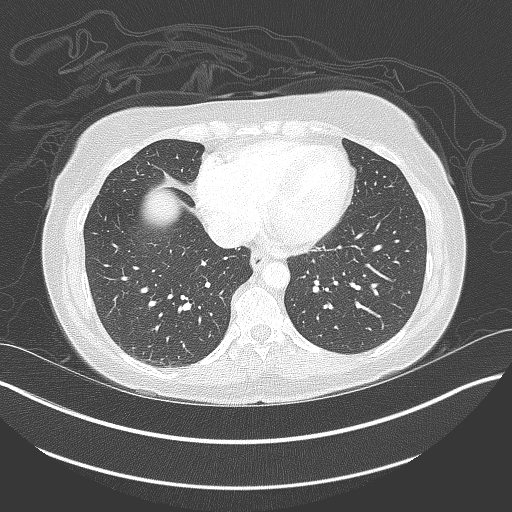
[im 71/141  lung]
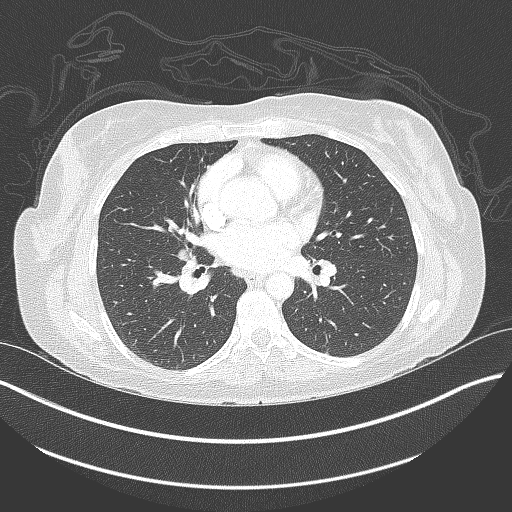
[im 94/141  lung]
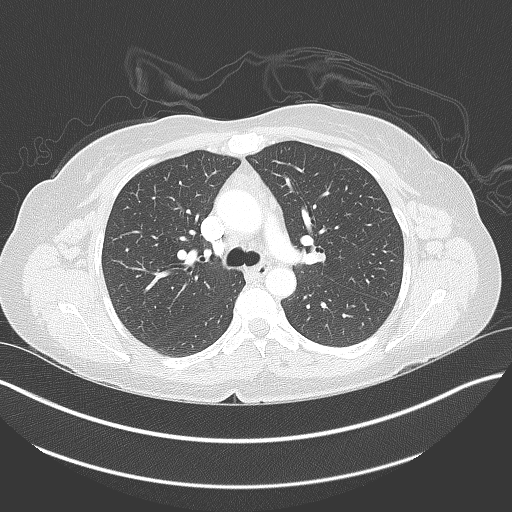
[im 117/141  lung]
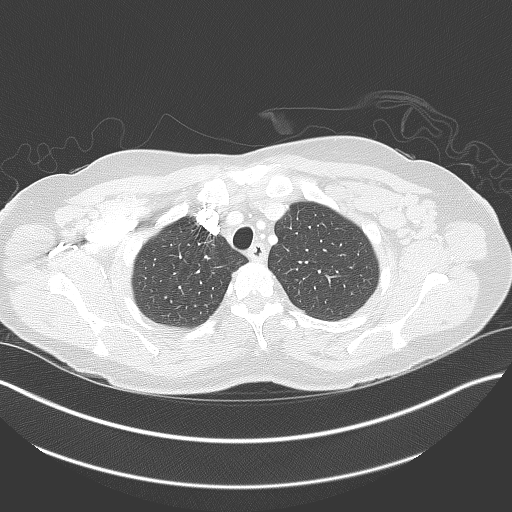

[Series 5: coronals · coronal · 0.73mm/px · 2 of 142 slices shown]
[im 48/142  lung]
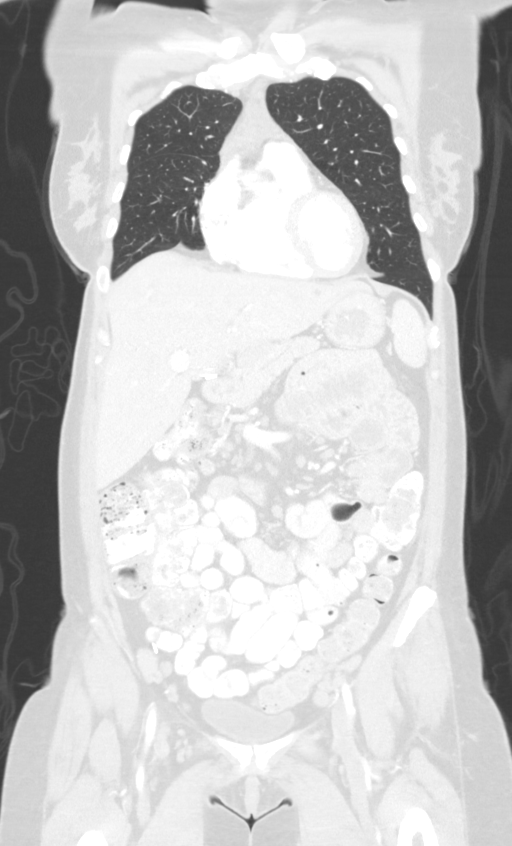
[im 95/142  lung]
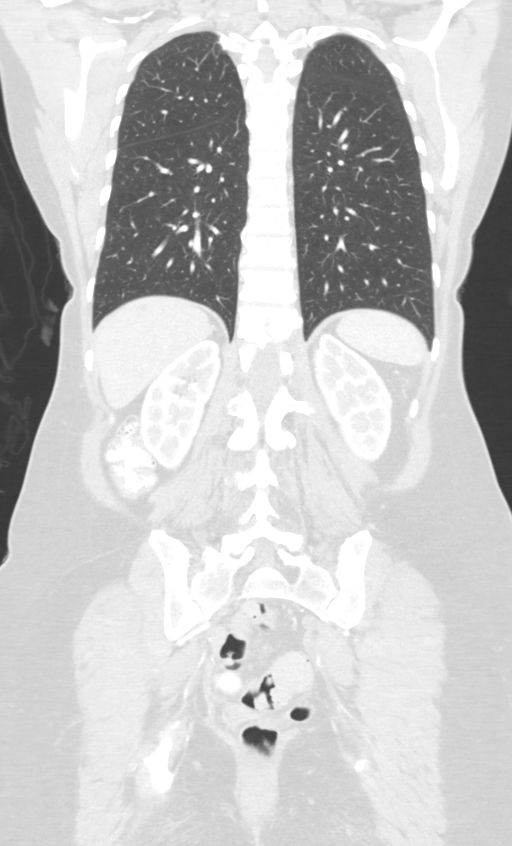

[Series 8: axial neck · axial · 0.64mm/px · z∈[+592,+730]mm · 4 of 117 slices shown]
[im 24/117  lung]
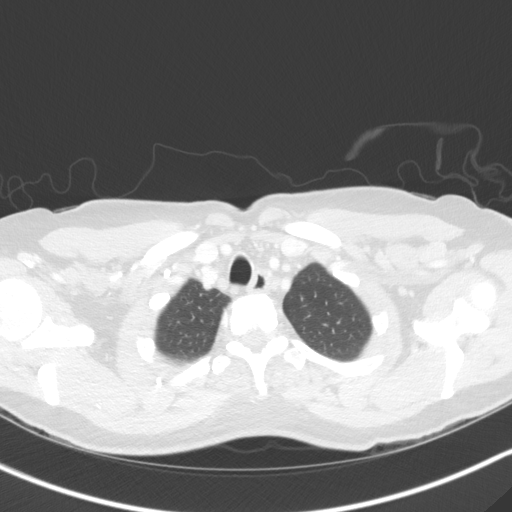
[im 47/117  lung]
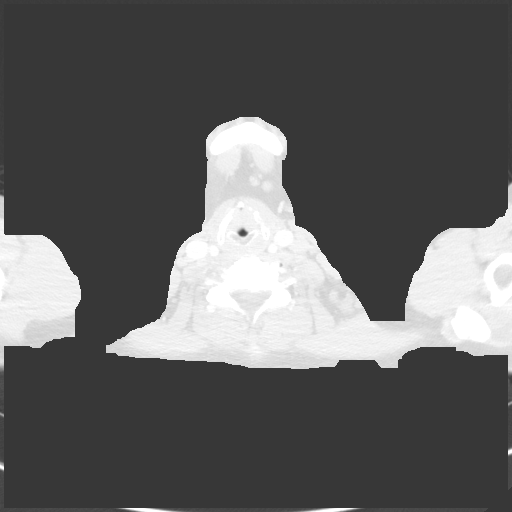
[im 70/117  lung]
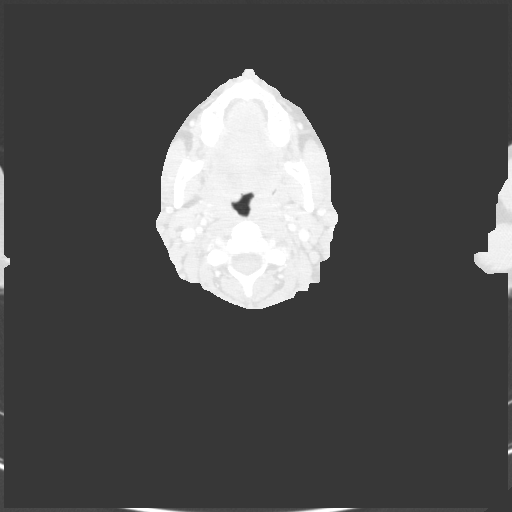
[im 93/117  lung]
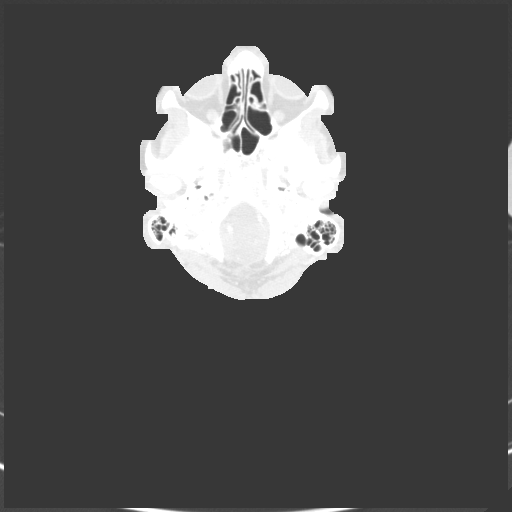

[14 of 36 positions shown; findings below may reference images not displayed]

FINDINGS: CT CHEST FINDINGS

Cardiovascular: Normal heart size. No significant pericardial
fluid/thickening. Great vessels are normal in course and caliber. No
central pulmonary emboli.

Mediastinum/Nodes: No discrete thyroid nodules. Unremarkable
esophagus. Moderate bilateral axillary adenopathy is increased.
Representative 1.9 cm right axillary node (series 2/image 13),
increased from 1.3 cm. Representative 1.8 cm left axillary node
(series 2/image 16), increased from 1.0 cm. New mild bilateral
supraclavicular adenopathy, largest 1.1 cm on the right (series
2/image 3). Remnant partially fat-degenerated thymic tissue in the
anterior mediastinum is stable. No mediastinal or hilar adenopathy.

Lungs/Pleura: No pneumothorax. No pleural effusion. No acute
consolidative airspace disease or lung masses. A few scattered small
solid pulmonary nodules in both lungs, largest 5 mm in the medial
right upper lobe (series 4/image 43), all stable since 09/05/2016
CT, considered benign. No new significant pulmonary nodules.

Musculoskeletal: No aggressive appearing focal osseous lesions. Mild
thoracic spondylosis.

CT ABDOMEN PELVIS FINDINGS

Hepatobiliary: Normal liver size. Scattered simple liver cysts,
largest 2.3 cm in the left liver lobe. Scattered subcentimeter
hypodense liver lesions throughout the liver are too small to
characterize and not appreciably changed, considered benign. No new
liver lesions. Cholecystectomy. Bile ducts are stable and within
normal post cholecystectomy limits.

Pancreas: Normal, with no mass or duct dilation.

Spleen: Normal size. No mass.

Adrenals/Urinary Tract: Normal adrenals. Normal kidneys with no
hydronephrosis and no renal mass. Normal bladder.

Stomach/Bowel: Normal non-distended stomach. Normal caliber small
bowel with no small bowel wall thickening. Appendectomy. Normal
large bowel with no diverticulosis, large bowel wall thickening or
pericolonic fat stranding. Oral contrast transits to the descending
colon.

Vascular/Lymphatic: Normal caliber abdominal aorta. Patent portal,
splenic, hepatic and renal veins. Enlarged 1.7 cm porta hepatis node
(series 2/image 54), increased from 1.4 cm. New left para-aortic
adenopathy measuring up to 1.0 cm (series 2/image 63). Newly
enlarged 1.4 cm aortocaval node (series 2/image 73). Increased
bilateral external iliac adenopathy. Representative left external
iliac 2.3 cm node (series 2/image 105), increased from 1.6 cm.

Reproductive: Status post hysterectomy, with no abnormal findings at
the vaginal cuff. No adnexal mass.

Other: No pneumoperitoneum, ascites or focal fluid collection.

Musculoskeletal: No aggressive appearing focal osseous lesions.
Moderate lower lumbar spondylosis, most prominent at L5-S1.
IMPRESSION: Interval increased widespread lymphadenopathy in the bilateral
supraclavicular neck, bilateral axilla, retroperitoneum and
bilateral pelvis, compatible with progression of lymphoma.

## 2019-08-25 ENCOUNTER — Encounter: Payer: Self-pay | Admitting: General Practice

## 2019-08-25 NOTE — Progress Notes (Signed)
[place] Spiritual Care Note  Followed up with Morgan [name] by webex as scheduled. While she is struggling with the physical and emotional toll of exacerbation of her symptoms, she is also practicing strong self-care by planning a trip to the beach, honoring her self-awareness about less healthy coping strategies, and engaging in healthy humor as much as possible. We had some good laughs! Morgan [name] also shared a poem that she has written to honor our [place], which is a testament to how much she values the community there. She plans to request another appointment when she knows her upcoming work schedule.   [place], [place], [facility] Pager [phone] Voicemail [phone]

## 2019-08-29 ENCOUNTER — Ambulatory Visit (INDEPENDENT_AMBULATORY_CARE_PROVIDER_SITE_OTHER): Payer: Self-pay | Admitting: Psychiatry

## 2019-08-29 DIAGNOSIS — R1012 Left upper quadrant pain: Secondary | ICD-10-CM | POA: Diagnosis not present

## 2019-08-29 DIAGNOSIS — F4310 Post-traumatic stress disorder, unspecified: Secondary | ICD-10-CM

## 2019-08-29 DIAGNOSIS — R1310 Dysphagia, unspecified: Secondary | ICD-10-CM | POA: Diagnosis not present

## 2019-08-29 DIAGNOSIS — R0789 Other chest pain: Secondary | ICD-10-CM | POA: Diagnosis not present

## 2019-08-29 DIAGNOSIS — R932 Abnormal findings on diagnostic imaging of liver and biliary tract: Secondary | ICD-10-CM | POA: Diagnosis not present

## 2019-08-29 DIAGNOSIS — K529 Noninfective gastroenteritis and colitis, unspecified: Secondary | ICD-10-CM | POA: Diagnosis not present

## 2019-08-29 DIAGNOSIS — F3173 Bipolar disorder, in partial remission, most recent episode manic: Secondary | ICD-10-CM

## 2019-08-31 ENCOUNTER — Encounter: Payer: Medicare Other | Admitting: Diagnostic Neuroimaging

## 2019-08-31 ENCOUNTER — Ambulatory Visit (INDEPENDENT_AMBULATORY_CARE_PROVIDER_SITE_OTHER): Payer: Medicare Other | Admitting: Diagnostic Neuroimaging

## 2019-08-31 ENCOUNTER — Other Ambulatory Visit: Payer: Self-pay

## 2019-08-31 ENCOUNTER — Ambulatory Visit (INDEPENDENT_AMBULATORY_CARE_PROVIDER_SITE_OTHER): Payer: Medicare Other | Admitting: Psychiatry

## 2019-08-31 DIAGNOSIS — R200 Anesthesia of skin: Secondary | ICD-10-CM

## 2019-08-31 DIAGNOSIS — Z0289 Encounter for other administrative examinations: Secondary | ICD-10-CM

## 2019-08-31 DIAGNOSIS — F4310 Post-traumatic stress disorder, unspecified: Secondary | ICD-10-CM

## 2019-08-31 DIAGNOSIS — F3173 Bipolar disorder, in partial remission, most recent episode manic: Secondary | ICD-10-CM

## 2019-08-31 NOTE — Procedures (Signed)
[facility]  NCS (NERVE CONDUCTION STUDY) WITH EMG (ELECTROMYOGRAPHY) REPORT   STUDY DATE: 08/31/19 PATIENT NAME: Morgan [name] DOB: 10/05/1965 MRN: [id number]  ORDERING CLINICIAN: Andrey Spearman, MD   TECHNOLOGIST: Sherre Scarlet ELECTROMYOGRAPHER: Earlean Polka. Miliana Gangwer, MD  CLINICAL INFORMATION: 54 year old female with numbness in hands and feet.  FINDINGS: NERVE CONDUCTION STUDY:  Bilateral median, right ulnar, left peroneal and left tibial motor responses are normal.  Bilateral median sensory responses have prolonged peak latencies and normal amplitudes.  Right ulnar, left sural and left superficial peroneal sensory responses are normal.  Right ulnar and left tibial F-wave latency is normal.   NEEDLE ELECTROMYOGRAPHY:  Needle examination of right upper extremity deltoid, biceps, triceps, flexor carpi radialis, first dorsal interosseous is normal.   IMPRESSION:   This study demonstrates: - Mild bilateral median sensory neuropathies at the wrist consistent with mild bilateral carpal tunnel syndrome.     INTERPRETING PHYSICIAN:  Penni Bombard, MD Certified in Neurology, Neurophysiology and Neuroimaging  [facility] [address] [place],  [address] [phone]   [facility]    Nerve / Sites Muscle Latency Ref. Amplitude Ref. Rel Amp Segments Distance Velocity Ref. Area    ms ms mV mV %  cm m/s m/s mVms  R Median - APB     Wrist APB 4.0 ?4.4 5.9 ?4.0 100 Wrist - APB 7   18.0     Upper arm APB 7.2  7.0  118 Upper arm - Wrist 21 66 ?49 21.6  L Median - APB     Wrist APB 3.5 ?4.4 5.5 ?4.0 100 Wrist - APB 7   16.4     Upper arm APB 6.8  5.4  98.8 Upper arm - Wrist 21 63 ?49 16.1  R Ulnar - ADM     Wrist ADM 2.3 ?3.3 10.5 ?6.0 100 Wrist - ADM 7   28.4     B.Elbow ADM 4.9  9.2  88 B.Elbow - Wrist 18 67 ?49 27.5     A.Elbow ADM 6.4  9.0  97.4 A.Elbow - B.Elbow 10 68 ?49 27.4         A.Elbow - Wrist      L  Peroneal - EDB     Ankle EDB 4.0 ?6.5 8.1 ?2.0 100 Ankle - EDB 9   24.6     Fib head EDB 9.7  7.0  86.5 Fib head - Ankle 30 53 ?44 22.8     Pop fossa EDB 11.4  6.7  95.3 Pop fossa - Fib head 10 58 ?44 22.3         Pop fossa - Ankle      L Tibial - AH     Ankle AH 3.2 ?5.8 10.1 ?4.0 100 Ankle - AH 9   18.0     Pop fossa AH 11.1  7.7  76.5 Pop fossa - Ankle 37 47 ?41 18.5               SNC    Nerve / Sites Rec. Site Peak Lat Ref.  Amp Ref. Segments Distance    ms ms V V  cm  L Sural - Ankle (Calf)     Calf Ankle 4.1 ?4.4 9 ?6 Calf - Ankle 14  L Superficial peroneal - Ankle     Lat leg Ankle 3.6 ?4.4 6 ?6 Lat leg - Ankle 14  R Median - Orthodromic (Dig II, Mid palm)     Dig  II Wrist 3.6 ?3.4 13 ?10 Dig II - Wrist 13  L Median - Orthodromic (Dig II, Mid palm)     Dig II Wrist 3.7 ?3.4 12 ?10 Dig II - Wrist 13  R Ulnar - Orthodromic, (Dig V, Mid palm)     Dig V Wrist 2.3 ?3.1 11 ?5 Dig V - Wrist 18               F  Wave    Nerve F Lat Ref.   ms ms  L Tibial - AH 48.1 ?56.0  R Ulnar - ADM 24.9 ?32.0         EMG Summary Table    Spontaneous MUAP Recruitment  Muscle IA Fib PSW Fasc Other Amp Dur. Poly Pattern  R. Deltoid Normal None None None _______ Normal Normal Normal Normal  R. Biceps brachii Normal None None None _______ Normal Normal Normal Normal  R. Triceps brachii Normal None None None _______ Normal Normal Normal Normal  R. Flexor carpi radialis Normal None None None _______ Normal Normal Normal Normal  R. First dorsal interosseous Normal None None None _______ Normal Normal Normal Normal

## 2019-09-05 ENCOUNTER — Ambulatory Visit (INDEPENDENT_AMBULATORY_CARE_PROVIDER_SITE_OTHER): Payer: Self-pay | Admitting: Psychiatry

## 2019-09-05 DIAGNOSIS — F3173 Bipolar disorder, in partial remission, most recent episode manic: Secondary | ICD-10-CM

## 2019-09-05 DIAGNOSIS — F4310 Post-traumatic stress disorder, unspecified: Secondary | ICD-10-CM

## 2019-09-07 ENCOUNTER — Ambulatory Visit: Payer: Medicare Other | Admitting: Psychiatry

## 2019-09-07 ENCOUNTER — Encounter: Payer: Self-pay | Admitting: Family Medicine

## 2019-09-07 ENCOUNTER — Other Ambulatory Visit: Payer: Self-pay | Admitting: Family Medicine

## 2019-09-07 DIAGNOSIS — C9110 Chronic lymphocytic leukemia of B-cell type not having achieved remission: Secondary | ICD-10-CM

## 2019-09-07 NOTE — Progress Notes (Signed)
Received lab order request from [facility], placed orders and will alert patient  Orders need to be faxed to [phone]  Attention Jonell Cluck, RN

## 2019-09-08 ENCOUNTER — Encounter: Payer: Self-pay | Admitting: General Practice

## 2019-09-08 NOTE — Progress Notes (Signed)
[place] Note  Morgan [name] used our phone call today to share and process how work stress is affecting her outlook and health (related to covid risks, etc). As part of recent coping and meaning-making, she has written a poem about community support and solidarity in her blood cancer support groups, which she is sharing with her group members. She plans to be in touch as further pastoral needs arise.   [place], [place], [facility] Pager [phone] Voicemail [phone]

## 2019-09-12 ENCOUNTER — Other Ambulatory Visit: Payer: Self-pay

## 2019-09-12 ENCOUNTER — Other Ambulatory Visit (INDEPENDENT_AMBULATORY_CARE_PROVIDER_SITE_OTHER): Payer: Medicare Other

## 2019-09-12 ENCOUNTER — Telehealth: Payer: Self-pay | Admitting: *Deleted

## 2019-09-12 ENCOUNTER — Ambulatory Visit: Payer: Medicare Other | Admitting: Psychiatry

## 2019-09-12 DIAGNOSIS — C9110 Chronic lymphocytic leukemia of B-cell type not having achieved remission: Secondary | ICD-10-CM | POA: Diagnosis not present

## 2019-09-12 LAB — COMPREHENSIVE METABOLIC PANEL
ALT: 23 U/L (ref 0–35)
AST: 25 U/L (ref 0–37)
Albumin: 4.7 g/dL (ref 3.5–5.2)
Alkaline Phosphatase: 58 U/L (ref 39–117)
BUN: 16 mg/dL (ref 6–23)
CO2: 30 mEq/L (ref 19–32)
Calcium: 10 mg/dL (ref 8.4–10.5)
Chloride: 102 mEq/L (ref 96–112)
Creatinine, Ser: 0.96 mg/dL (ref 0.40–1.20)
GFR: 60.41 mL/min (ref >60.00)
Glucose, Bld: 85 mg/dL (ref 70–99)
Potassium: 4.2 mEq/L (ref 3.5–5.1)
Sodium: 138 mEq/L (ref 135–145)
Total Bilirubin: 0.4 mg/dL (ref 0.2–1.2)
Total Protein: 6.4 g/dL (ref 6.0–8.3)

## 2019-09-12 LAB — CBC WITH DIFFERENTIAL/PLATELET
Basophils Absolute: 0.1 10*3/uL (ref 0.0–0.1)
Basophils Relative: 0.1 % (ref 0.0–3.0)
Eosinophils Absolute: 0.5 10*3/uL (ref 0.0–0.7)
Eosinophils Relative: 0.6 % (ref 0.0–5.0)
HCT: 39.8 % (ref 36.0–46.0)
Hemoglobin: 12.5 g/dL (ref 12.0–15.0)
Lymphocytes Relative: 92.3 % — ABNORMAL HIGH (ref 12.0–46.0)
Lymphs Abs: 72.5 10*3/uL — ABNORMAL HIGH (ref 0.7–4.0)
MCHC: 31.4 g/dL (ref 30.0–36.0)
MCV: 87.4 fl (ref 78.0–100.0)
Monocytes Absolute: 0.5 10*3/uL (ref 0.1–1.0)
Monocytes Relative: 0.6 % — ABNORMAL LOW (ref 3.0–12.0)
Neutro Abs: 5 10*3/uL (ref 1.4–7.7)
Neutrophils Relative %: 6.4 % — ABNORMAL LOW (ref 43.0–77.0)
Platelets: 156 10*3/uL (ref 150.0–400.0)
RBC: 4.56 Mil/uL (ref 3.87–5.11)
RDW: 14.9 % (ref 11.5–15.5)
WBC: 78.6 10*3/uL (ref 4.0–10.5)

## 2019-09-12 LAB — URIC ACID: Uric Acid, Serum: 5.9 mg/dL (ref 2.4–7.0)

## 2019-09-12 NOTE — Addendum Note (Signed)
Addended by: Kelle Darting A on: 09/12/2019 09:07 AM   Modules accepted: Orders

## 2019-09-12 NOTE — Telephone Encounter (Signed)
Pt has leukemia--- these were requested by [facility]

## 2019-09-12 NOTE — Telephone Encounter (Signed)
Thanks so much for handling these couple of issues for me today!

## 2019-09-12 NOTE — Telephone Encounter (Signed)
CRITICAL VALUE STICKER  CRITICAL VALUE:  WBC  78.6  RECEIVER (on-site recipient of call):  Kelle Darting, [place] NOTIFIED:  09/12/19 @ 4:46pm  MESSENGER (representative from lab):  Shari Prows.  MD NOTIFIED:  [place]: 4:46pm  RESPONSE:

## 2019-09-13 LAB — LACTATE DEHYDROGENASE: LDH: 349 U/L — ABNORMAL HIGH (ref 120–250)

## 2019-09-13 NOTE — Telephone Encounter (Signed)
Results have been faxed to [name].

## 2019-09-13 NOTE — Telephone Encounter (Signed)
No problem.

## 2019-09-14 ENCOUNTER — Ambulatory Visit (INDEPENDENT_AMBULATORY_CARE_PROVIDER_SITE_OTHER): Payer: Medicare Other | Admitting: Psychiatry

## 2019-09-14 ENCOUNTER — Telehealth: Payer: Self-pay | Admitting: General Practice

## 2019-09-14 DIAGNOSIS — F3173 Bipolar disorder, in partial remission, most recent episode manic: Secondary | ICD-10-CM

## 2019-09-14 DIAGNOSIS — F4310 Post-traumatic stress disorder, unspecified: Secondary | ICD-10-CM

## 2019-09-14 LAB — IMMUNOGLOBULINS A/E/G/M, SERUM
IgA/Immunoglobulin A, Serum: 10 mg/dL — ABNORMAL LOW (ref 87–352)
IgE (Immunoglobulin E), Serum: <2 IU/mL — ABNORMAL LOW (ref 6–495)
IgG (Immunoglobin G), Serum: 433 mg/dL — ABNORMAL LOW (ref 586–1602)
IgM (Immunoglobulin M), Srm: <5 mg/dL — ABNORMAL LOW (ref 26–217)

## 2019-09-14 NOTE — Telephone Encounter (Signed)
[place] CSW Progress Notes  Call from patient, inquiring about [employer], a cancer wellness retreat/program in [place].  CSW described program, encouarged patient to explore further, linked patient w S Carney one of the founders of the program.  Edwyna Shell, [place] Worker Phone:  [phone] Cell:  [phone]

## 2019-09-18 DIAGNOSIS — R1310 Dysphagia, unspecified: Secondary | ICD-10-CM | POA: Diagnosis not present

## 2019-09-18 DIAGNOSIS — Z01812 Encounter for preprocedural laboratory examination: Secondary | ICD-10-CM | POA: Diagnosis not present

## 2019-09-19 ENCOUNTER — Ambulatory Visit (INDEPENDENT_AMBULATORY_CARE_PROVIDER_SITE_OTHER): Payer: Self-pay | Admitting: Psychiatry

## 2019-09-19 DIAGNOSIS — F4310 Post-traumatic stress disorder, unspecified: Secondary | ICD-10-CM

## 2019-09-19 DIAGNOSIS — F3173 Bipolar disorder, in partial remission, most recent episode manic: Secondary | ICD-10-CM

## 2019-09-21 ENCOUNTER — Ambulatory Visit (INDEPENDENT_AMBULATORY_CARE_PROVIDER_SITE_OTHER): Payer: Medicare Other | Admitting: Psychiatry

## 2019-09-21 DIAGNOSIS — F3173 Bipolar disorder, in partial remission, most recent episode manic: Secondary | ICD-10-CM

## 2019-09-21 DIAGNOSIS — F4310 Post-traumatic stress disorder, unspecified: Secondary | ICD-10-CM

## 2019-09-22 ENCOUNTER — Encounter: Payer: Self-pay | Admitting: General Practice

## 2019-09-22 NOTE — Progress Notes (Signed)
[facility] Spiritual Care Note  Had virtual visit with [facility] as planned. Her energy is lower as she copes with more symptoms; at the same time, she describes a higher level of acceptance of what is happening right now, which, she notes, is helping her cope with these changes. [place] is excited to register for the [employer] retreat and is exploring other art ideas (based on past therapeutic experiences that have helped her) that she might share with the newly forming [place] support group. She remains attuned to supporting others as a way of living out her faith and sense of purpose/contribution, which is helping her build community through her other support groups, as well. She plans to be in touch again as needed.   [place], [place], [facility] Pager [phone] Voicemail [phone]

## 2019-09-25 ENCOUNTER — Encounter: Payer: Self-pay | Admitting: General Practice

## 2019-09-25 DIAGNOSIS — R933 Abnormal findings on diagnostic imaging of other parts of digestive tract: Secondary | ICD-10-CM | POA: Diagnosis not present

## 2019-09-25 DIAGNOSIS — D479 Neoplasm of uncertain behavior of lymphoid, hematopoietic and related tissue, unspecified: Secondary | ICD-10-CM | POA: Diagnosis not present

## 2019-09-25 DIAGNOSIS — R591 Generalized enlarged lymph nodes: Secondary | ICD-10-CM | POA: Diagnosis not present

## 2019-09-25 DIAGNOSIS — R935 Abnormal findings on diagnostic imaging of other abdominal regions, including retroperitoneum: Secondary | ICD-10-CM | POA: Diagnosis not present

## 2019-09-25 DIAGNOSIS — C9110 Chronic lymphocytic leukemia of B-cell type not having achieved remission: Secondary | ICD-10-CM | POA: Diagnosis not present
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
## 2019-09-26 ENCOUNTER — Telehealth: Payer: Self-pay | Admitting: Family Medicine

## 2019-09-26 ENCOUNTER — Ambulatory Visit (INDEPENDENT_AMBULATORY_CARE_PROVIDER_SITE_OTHER): Payer: Medicare Other | Admitting: Psychiatry

## 2019-09-26 DIAGNOSIS — F4310 Post-traumatic stress disorder, unspecified: Secondary | ICD-10-CM

## 2019-09-26 DIAGNOSIS — F3173 Bipolar disorder, in partial remission, most recent episode manic: Secondary | ICD-10-CM
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
## 2019-09-27 NOTE — Telephone Encounter (Signed)
Sent patient Mudlogger

## 2019-09-28 ENCOUNTER — Ambulatory Visit (INDEPENDENT_AMBULATORY_CARE_PROVIDER_SITE_OTHER): Payer: Medicare Other | Admitting: Family Medicine

## 2019-09-28 ENCOUNTER — Other Ambulatory Visit: Payer: Self-pay

## 2019-09-28 ENCOUNTER — Ambulatory Visit (INDEPENDENT_AMBULATORY_CARE_PROVIDER_SITE_OTHER): Payer: Medicare Other | Admitting: Psychiatry

## 2019-09-28 ENCOUNTER — Encounter: Payer: Self-pay | Admitting: Family Medicine

## 2019-09-28 DIAGNOSIS — R509 Fever, unspecified: Secondary | ICD-10-CM | POA: Diagnosis not present

## 2019-09-28 DIAGNOSIS — J0100 Acute maxillary sinusitis, unspecified: Secondary | ICD-10-CM

## 2019-09-28 DIAGNOSIS — C9110 Chronic lymphocytic leukemia of B-cell type not having achieved remission: Secondary | ICD-10-CM | POA: Diagnosis not present

## 2019-09-28 DIAGNOSIS — F3173 Bipolar disorder, in partial remission, most recent episode manic: Secondary | ICD-10-CM

## 2019-09-28 DIAGNOSIS — F4310 Post-traumatic stress disorder, unspecified: Secondary | ICD-10-CM

## 2019-09-28 MED ORDER — CLARITHROMYCIN ER 500 MG PO TB24: 1000.0000 mg | ORAL_TABLET | Freq: Every day | ORAL | 0 refills | Status: AC
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
## 2019-10-02 DIAGNOSIS — D72820 Lymphocytosis (symptomatic): Secondary | ICD-10-CM | POA: Diagnosis not present

## 2019-10-02 DIAGNOSIS — K589 Irritable bowel syndrome without diarrhea: Secondary | ICD-10-CM | POA: Diagnosis not present

## 2019-10-02 DIAGNOSIS — J45909 Unspecified asthma, uncomplicated: Secondary | ICD-10-CM | POA: Diagnosis not present

## 2019-10-02 DIAGNOSIS — C9110 Chronic lymphocytic leukemia of B-cell type not having achieved remission: Secondary | ICD-10-CM | POA: Diagnosis not present

## 2019-10-02 DIAGNOSIS — Z87891 Personal history of nicotine dependence: Secondary | ICD-10-CM | POA: Diagnosis not present

## 2019-10-02 DIAGNOSIS — R002 Palpitations: Secondary | ICD-10-CM | POA: Diagnosis not present

## 2019-10-02 DIAGNOSIS — R911 Solitary pulmonary nodule: Secondary | ICD-10-CM | POA: Diagnosis not present

## 2019-10-02 DIAGNOSIS — R42 Dizziness and giddiness: Secondary | ICD-10-CM | POA: Diagnosis not present

## 2019-10-02 DIAGNOSIS — Z01818 Encounter for other preprocedural examination: Secondary | ICD-10-CM | POA: Diagnosis not present

## 2019-10-02 DIAGNOSIS — I493 Ventricular premature depolarization: Secondary | ICD-10-CM | POA: Diagnosis not present

## 2019-10-02 DIAGNOSIS — Z79899 Other long term (current) drug therapy: Secondary | ICD-10-CM | POA: Diagnosis not present

## 2019-10-03 ENCOUNTER — Ambulatory Visit: Payer: Medicare Other | Admitting: Psychiatry

## 2019-10-03 ENCOUNTER — Encounter: Payer: Self-pay | Admitting: General Practice

## 2019-10-03 NOTE — Progress Notes (Signed)
[place] Spiritual Care Note  Had a Spiritual Care Virtual Visit with [facility] via webex, making space for her to process yesterday's appointments at [facility] and preparing to start treatment. Normalized and validated her reported emotional numbness as a common early reaction to a big stressor, such as trauma or loss. Provided empathic listening as she worked through the layers of feeling contributing to an overall ambivalence and wariness toward treatment, while she also juggles an increased symptom load, including pain that she needed to pause and breathe through during our session. She plans to check in Friday 3/19 about how she is feeling.   [place], [place], [facility] Pager [phone] Voicemail [phone]

## 2019-10-05 ENCOUNTER — Ambulatory Visit: Payer: Medicare Other | Admitting: Psychiatry

## 2019-10-10 ENCOUNTER — Ambulatory Visit (INDEPENDENT_AMBULATORY_CARE_PROVIDER_SITE_OTHER): Payer: Medicare Other | Admitting: Psychiatry

## 2019-10-10 DIAGNOSIS — F4310 Post-traumatic stress disorder, unspecified: Secondary | ICD-10-CM

## 2019-10-10 DIAGNOSIS — F3173 Bipolar disorder, in partial remission, most recent episode manic: Secondary | ICD-10-CM

## 2019-10-12 ENCOUNTER — Ambulatory Visit (INDEPENDENT_AMBULATORY_CARE_PROVIDER_SITE_OTHER): Payer: Medicare Other | Admitting: Psychiatry

## 2019-10-12 DIAGNOSIS — F3173 Bipolar disorder, in partial remission, most recent episode manic: Secondary | ICD-10-CM

## 2019-10-12 DIAGNOSIS — F4310 Post-traumatic stress disorder, unspecified: Secondary | ICD-10-CM

## 2019-10-15 ENCOUNTER — Encounter: Payer: Self-pay | Admitting: Family Medicine

## 2019-10-17 ENCOUNTER — Ambulatory Visit (INDEPENDENT_AMBULATORY_CARE_PROVIDER_SITE_OTHER): Payer: Medicare Other | Admitting: Psychiatry

## 2019-10-17 DIAGNOSIS — F3173 Bipolar disorder, in partial remission, most recent episode manic: Secondary | ICD-10-CM

## 2019-10-17 DIAGNOSIS — F4310 Post-traumatic stress disorder, unspecified: Secondary | ICD-10-CM

## 2019-10-19 ENCOUNTER — Ambulatory Visit: Payer: Medicare Other | Admitting: Psychiatry

## 2019-10-20 ENCOUNTER — Telehealth: Payer: Self-pay | Admitting: General Practice

## 2019-10-20 NOTE — Telephone Encounter (Signed)
[place] CSW Progress Notes  Call from patient, discussed she is processing emotions related to grief/loss/support system strains.  Supportive listening provided.  She is well connected w chaplain L Thamas Jaegers and is an active participant in various support groups.  She works hard at nurturing relationships and connecting with others, especially those who have been diagnosed w cancer.    Morgan Shell, LCSW Clinical Social Worker Phone:  [phone] Cell:  [phone]

## 2019-10-24 ENCOUNTER — Encounter: Payer: Self-pay | Admitting: General Practice

## 2019-10-24 NOTE — Progress Notes (Signed)
[facility] Spiritual Care Note  Had Spiritual Care Virtual Visit with [facility] via webex. She utilized the opportunity to process recent frustrations, health changes, and issues related to her support. Spiritual Care remains an important part of her support team as a safe place for pastoral reflection and social support--especially as she is between counselors due to Dr Sharyn Lull Anderson's retirement. We have another virtual appointment set up for 4/17.   [place], [place], [facility] Pager [phone] Voicemail [phone]

## 2019-10-26 ENCOUNTER — Ambulatory Visit: Payer: Medicare Other | Admitting: Psychiatry

## 2019-10-30 ENCOUNTER — Telehealth: Payer: Self-pay | Admitting: Family Medicine

## 2019-10-30 NOTE — Chronic Care Management (AMB) (Signed)
  Chronic Care Management   Note  10/30/2019 Name: Morgan [name] MRN: [id number] DOB: March 08, 1966  Morgan [name] is a 54 y.o. year old female who is a primary care patient of Copland, Gay Filler, MD. I reached out to [facility] by phone today in response to a referral sent by Morgan [name]'s PCP, Copland, Gay Filler, MD.   Morgan [name] was given information about Chronic Care Management services today including:  1. CCM service includes personalized support from designated clinical staff supervised by her physician, including individualized plan of care and coordination with other care providers 2. 24/7 contact phone numbers for assistance for urgent and routine care needs. 3. Service will only be billed when office clinical staff spend 20 minutes or more in a month to coordinate care. 4. Only one practitioner may furnish and bill the service in a calendar month. 5. The patient may stop CCM services at any time (effective at the end of the month) by phone call to the office staff.   Patient agreed to services and verbal consent obtained.   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

## 2019-11-02 ENCOUNTER — Ambulatory Visit: Payer: Medicare Other | Admitting: Psychiatry

## 2019-11-03 DIAGNOSIS — R911 Solitary pulmonary nodule: Secondary | ICD-10-CM | POA: Diagnosis not present

## 2019-11-03 DIAGNOSIS — R42 Dizziness and giddiness: Secondary | ICD-10-CM | POA: Diagnosis not present

## 2019-11-03 DIAGNOSIS — C9100 Acute lymphoblastic leukemia not having achieved remission: Secondary | ICD-10-CM | POA: Diagnosis not present

## 2019-11-03 DIAGNOSIS — J45909 Unspecified asthma, uncomplicated: Secondary | ICD-10-CM | POA: Diagnosis not present

## 2019-11-03 DIAGNOSIS — K589 Irritable bowel syndrome without diarrhea: Secondary | ICD-10-CM | POA: Diagnosis not present

## 2019-11-03 DIAGNOSIS — D72820 Lymphocytosis (symptomatic): Secondary | ICD-10-CM | POA: Diagnosis not present

## 2019-11-03 DIAGNOSIS — E039 Hypothyroidism, unspecified: Secondary | ICD-10-CM | POA: Diagnosis not present

## 2019-11-03 DIAGNOSIS — Z79899 Other long term (current) drug therapy: Secondary | ICD-10-CM | POA: Diagnosis not present

## 2019-11-03 DIAGNOSIS — R079 Chest pain, unspecified: Secondary | ICD-10-CM | POA: Diagnosis not present

## 2019-11-03 DIAGNOSIS — E875 Hyperkalemia: Secondary | ICD-10-CM | POA: Diagnosis not present

## 2019-11-03 DIAGNOSIS — R002 Palpitations: Secondary | ICD-10-CM | POA: Diagnosis not present

## 2019-11-03 DIAGNOSIS — C9110 Chronic lymphocytic leukemia of B-cell type not having achieved remission: Secondary | ICD-10-CM | POA: Diagnosis not present

## 2019-11-03 DIAGNOSIS — Z87891 Personal history of nicotine dependence: Secondary | ICD-10-CM | POA: Diagnosis not present

## 2019-11-06 ENCOUNTER — Telehealth (INDEPENDENT_AMBULATORY_CARE_PROVIDER_SITE_OTHER): Payer: Medicare Other | Admitting: Family Medicine

## 2019-11-06 ENCOUNTER — Other Ambulatory Visit: Payer: Self-pay

## 2019-11-06 DIAGNOSIS — R079 Chest pain, unspecified: Secondary | ICD-10-CM | POA: Diagnosis not present

## 2019-11-06 DIAGNOSIS — E039 Hypothyroidism, unspecified: Secondary | ICD-10-CM

## 2019-11-06 DIAGNOSIS — R002 Palpitations: Secondary | ICD-10-CM | POA: Diagnosis not present

## 2019-11-06 DIAGNOSIS — B029 Zoster without complications: Secondary | ICD-10-CM

## 2019-11-06 MED ORDER — VALACYCLOVIR HCL 1 G PO TABS: 1000.0000 mg | ORAL_TABLET | Freq: Every day | ORAL | 3 refills | Status: DC

## 2019-11-06 MED ORDER — LEVOTHYROXINE SODIUM 25 MCG PO TABS: 25.0000 ug | ORAL_TABLET | Freq: Every day | ORAL | 2 refills | Status: DC

## 2019-11-06 NOTE — Progress Notes (Signed)
Morgan [name] at [facility] [address], [place], [place] [address] [phone] [phone]  Date:  11/06/2019   Name:  Morgan [name]   DOB:  October 24, 1965   MRN:  [id number]  PCP:  Darreld Mclean, MD    Chief Complaint: No chief complaint on file.   History of Present Illness:  Morgan [name] is a 54 y.o. very pleasant female patient who presents with the following:  Virtual visit today to discuss her concern about her thyroid Patient location is home, provider location is office.  Patient identity confirmed with 2 factors, she gives consent for virtual visit today. The patient and myself are present on video call today Patient with history of CLL, mood disorder  She is wearing a heart monitor per [facility] cardiology  They are making sure her heart is okay for a new CLL treatment she wishes to start   She just had her TSH checked per [facility] on 4/16- it was high at 8.79, unable to see this on care everywhere FT4 0.99 She is not currently on any thyroid replacement - she has 25 mcg on hand  She is taking an OTC lithium which may be causing hypothyroidism She has never been on a high dose of levothyroxine- 50 mcg max in the past   She has noted recent return of her chronic chronic shingles- [facility] hematologist [facility] put her on valtrex 1000 TID for 10 days We decided to keep her on valtrex 1000 mg daily for long term suppression  She had her 2nd covid vaccine this past Friday-she notes some redness and soreness of the arm where she had the vaccine.  She has noted some malaise but no serious symptoms  Patient Active Problem List   Diagnosis Date Noted  . Fever 06/18/2018  . Piriformis syndrome of right side 04/22/2017  . Lateral epicondylitis of left elbow 04/06/2017  . Solitary pulmonary nodule 10/29/2016  . Chronic lymphocytic leukemia ([place]) 07/31/2016  . Mass in neck 04/17/2016  . Abdominal mass 04/17/2016  . Asthma 12/12/2015  . Multiple  environmental allergies 12/12/2015  . Chronic lower back pain 09/12/2015  . Bipolar 1 disorder, depressed ([place]) 08/27/2015    Class: Chronic  . Other specified hypothyroidism 08/23/2015  . Borderline personality disorder ([place]) 08/15/2015  . Severe benzodiazepine use disorder ([place]) 08/15/2015  . Alcohol use disorder, moderate, dependence ([place]) 08/15/2015  . PTSD (post-traumatic stress disorder) 08/15/2015  . History of bipolar disorder 08/15/2015  . Pre-syncope 05/17/2015  . Skull deformity 05/17/2015  . Patellofemoral syndrome of both knees 05/02/2015    Past Medical History:  Diagnosis Date  . Anxiety   . Arthritis   . Asthma   . Bipolar disorder ([employer])   . Bulging lumbar disc 07/19/05  . Chronic headaches   . Chronic lymphocytic leukemia ([place]) 07/31/2016  . Colon polyps   . Degenerative disorder of bone   . Depression   . History of borderline personality disorder   . Hypothyroidism 2007   Developed after use of Lithium  . IBS (irritable bowel syndrome) 1995  . Pneumonia   . Proctitis   . Shingles 07/2018  . Substance abuse ([place])    ativan last month    Past Surgical History:  Procedure Laterality Date  . ABDOMINAL HYSTERECTOMY    . APPENDECTOMY    . BUNIONECTOMY Right 06/2012  . CHOLECYSTECTOMY    . OOPHORECTOMY    . SHOULDER OPEN ROTATOR CUFF REPAIR Right  03/2010  . TUBAL LIGATION      Social History   Tobacco Use  . Smoking status: Former Smoker    Quit date: 07/20/1980    Years since quitting: 39.3  . Smokeless tobacco: Never Used  Substance Use Topics  . Alcohol use: Yes    Alcohol/week: 0.0 standard drinks    Comment: occ  . Drug use: Not Currently    Family History  Problem Relation Age of Onset  . Depression Mother   . Hypertension Mother   . Post-traumatic stress disorder Sister   . Alcohol abuse Brother   . Drug abuse Brother   . Post-traumatic stress disorder Brother   . Thyroid disease Father   . Alzheimer's disease Father   . COPD  Maternal Grandmother   . Heart disease Maternal Grandfather   . Arthritis Paternal Grandmother   . Diabetes Paternal Grandmother   . Depression Paternal Grandmother   . Alzheimer's disease Paternal Grandmother   . Heart disease Paternal Grandfather   . Coronary artery disease Paternal Grandfather   . Alcohol abuse Paternal Grandfather   . Colon cancer Neg Hx   . Breast cancer Neg Hx     Allergies  Allergen Reactions  . Aripiprazole Anaphylaxis and Swelling  . Ciprofloxacin Shortness Of Breath  . Lamotrigine Rash and Other (See Comments)  . Nitrofurantoin Hives  . Other Anaphylaxis, Hives and Swelling    Walnuts and pine nuts  . Peanut Oil Anaphylaxis, Hives, Swelling and Rash  . Amoxicillin Hives and Rash    Has patient had a PCN reaction causing immediate rash, facial/tongue/throat swelling, SOB or lightheadedness with hypotension: Yes Has patient had a PCN reaction causing severe rash involving mucus membranes or skin necrosis: No Has patient had a PCN reaction that required hospitalization: No Has patient had a PCN reaction occurring within the last 10 years: No If all of the above answers are "NO", then may proceed with Cephalosporin use.  Marland Kitchen Doxycycline Hives and Rash  . Latex Rash and Other (See Comments)  . Meloxicam Itching and Other (See Comments)    Induces mania Interacts with Lithium  . Naproxen Hives and Other (See Comments)    Interacts with lithium  . Pramipexole Hives and Rash  . Prednisone Other (See Comments)    Interacts with Lithium  . Risperidone Hives and Rash  . Sulfa Antibiotics Rash    "that leaves scarring"  . Tape Itching and Rash    Steri-StripsT  . Cortisone Other (See Comments)    Exacerbates the mania of her bipolar  . Percocet [Oxycodone-Acetaminophen] Other (See Comments)    Severe dizziness  . Shingrix [Zoster Vac Recomb Adjuvanted] Swelling    rash    Medication list has been reviewed and updated.  Current Outpatient Medications  on File Prior to Visit  Medication Sig Dispense Refill  . acetaminophen (TYLENOL) 500 MG tablet Take 1,000 mg by mouth every 6 (six) hours as needed for mild pain.    Marland Kitchen albuterol (PROVENTIL HFA;VENTOLIN HFA) 108 (90 Base) MCG/ACT inhaler Inhale 1-2 puffs into the lungs every 6 (six) hours as needed for wheezing or shortness of breath. 1 each 3  . diphenhydrAMINE (BENADRYL) 25 MG tablet Take 1 tablet (25 mg total) by mouth every 6 (six) hours as needed for itching. 20 tablet 0  . fluticasone furoate-vilanterol (BREO ELLIPTA) 100-25 MCG/INH AEPB Inhale 1 puff into the lungs daily. 1 each 5  . hyoscyamine (LEVSIN, ANASPAZ) 0.125 MG tablet Take 1 tablet (0.125  mg total) by mouth as needed for cramping. Reported on 09/24/2015 10 tablet 2  . LORazepam (ATIVAN) 0.5 MG tablet Take 1 tablet (0.5 mg total) by mouth every 12 (twelve) hours as needed for anxiety. 30 tablet 1  . rizatriptan (MAXALT-MLT) 10 MG disintegrating tablet Take 1 tablet (10 mg total) by mouth as needed for migraine. May repeat in 2 hours if needed 9 tablet 11  . UNABLE TO FIND Take by mouth. ELDERBERRY FRUIT ORAL    . UNABLE TO FIND Med Name: Lithium 4.8 mg one daily     No current facility-administered medications on file prior to visit.    Review of Systems:  As per HPI- otherwise negative.   Physical Examination: There were no vitals filed for this visit. There were no vitals filed for this visit. There is no height or weight on file to calculate BMI. Ideal Body Weight:     Pt observed via mychart video Vitals done at [facility] cardiology earlier today - pt reports that her VS were normal at that appt  She looks well, her normal self. I am not able to get a good look at any redness of her arm from recent Covid vaccine on video unfortunately  Assessment and Plan: Herpes zoster without complication - Plan: valACYclovir (VALTREX) 1000 MG tablet  Acquired hypothyroidism - Plan: TSH, levothyroxine (SYNTHROID) 25 MCG  tablet  Virtual visit today to discuss a couple of concerns Recent elevated TSH done per [facility] cardiology.  We will have her start back on levothyroxine 25 mcg, plan to recheck TSH in 1 month. She has been bothered by recurrence/chronic zoster, tends to be on her right anterior ribs.  Her cardiologist recently started her on Valtrex 1000 3 times daily for 10 days.  We will have her continue 1000 daily for chronic suppression  Answered all questions, will look for her repeat TSH in 1 month Advised her that the localized reaction her arm is likely not going to cause her any serious ill effects, but we are here if it seems to be getting worse or is not getting better  Signed Lamar Blinks, MD

## 2019-11-09 ENCOUNTER — Other Ambulatory Visit: Payer: Self-pay

## 2019-11-09 ENCOUNTER — Encounter: Payer: Self-pay | Admitting: Family Medicine

## 2019-11-09 ENCOUNTER — Ambulatory Visit: Payer: Medicare Other | Admitting: Psychiatry

## 2019-11-09 ENCOUNTER — Ambulatory Visit (INDEPENDENT_AMBULATORY_CARE_PROVIDER_SITE_OTHER): Payer: Medicare Other | Admitting: Family Medicine

## 2019-11-09 ENCOUNTER — Telehealth: Payer: Self-pay | Admitting: Family Medicine

## 2019-11-09 VITALS — BP 105/71 | HR 87 | Temp 97.0°F | Resp 18 | Ht 63.5 in | Wt 167.0 lb

## 2019-11-09 DIAGNOSIS — L03113 Cellulitis of right upper limb: Secondary | ICD-10-CM

## 2019-11-09 MED ORDER — CEPHALEXIN 500 MG PO CAPS: 500.0000 mg | ORAL_CAPSULE | Freq: Three times a day (TID) | ORAL | 0 refills | Status: DC

## 2019-11-09 NOTE — Telephone Encounter (Signed)
Error

## 2019-11-09 NOTE — Patient Instructions (Signed)
It was good to see you again today- I am sorry that you had this reaction!  I do think that your reaction will resolve, but if you are concerned about infection we can start you on keflex for one week Ok to use benadryl as needed for itching and redness, and benadryl cream as needed  Let me know if you have any concerns at all- you can email me over the weekend if needed

## 2019-11-09 NOTE — Progress Notes (Signed)
[place] at [facility] [place], [place], [place] [address] [phone]  Date:  11/09/2019   Name:  Morgan [name]   DOB:  Apr 17, 1966   MRN:  [id number]  PCP:  Darreld Mclean, MD    Chief Complaint: Arm Pain (injection reaction, redness/rash, itching, hot to touch, diarrhea)   History of Present Illness:  Morgan [name] is a 54 y.o. very pleasant female patient who presents with the following:  Morgan [name] has history of CLL, mood disorder, hypothyroidism Pt seen here 3 days ago for a virtual visit- at that time she was concerned with slight redness of her arm from recent covid vaccine.  Unfortunately in the interim her reaction seems to have gotten worse She had her 2nd dose of covid 19 vaccine on Friday 4/16.  On Monday she noted a little redness in her arm- this seemed to be getting better but then this am she woke up and her arm was quite itchy and red all over the lateral upper arm.  She has had cellulitis before, and is concerned this could happen again  No fever noted- 98.8 yesterday when she checked her temp Today she feels a bit nauseated, loose stools the last couple of days   Otherwise she feels at baseline, no cough or unusual malaise is noted No other rashes or skin changes  Patient Active Problem List   Diagnosis Date Noted  . Fever 06/18/2018  . Piriformis syndrome of right side 04/22/2017  . Lateral epicondylitis of left elbow 04/06/2017  . Solitary pulmonary nodule 10/29/2016  . Chronic lymphocytic leukemia ([place]) 07/31/2016  . Mass in neck 04/17/2016  . Abdominal mass 04/17/2016  . Asthma 12/12/2015  . Multiple environmental allergies 12/12/2015  . Chronic lower back pain 09/12/2015  . Bipolar 1 disorder, depressed ([place]) 08/27/2015    Class: Chronic  . Other specified hypothyroidism 08/23/2015  . Borderline personality disorder ([place]) 08/15/2015  . Severe benzodiazepine use disorder ([place]) 08/15/2015  .  Alcohol use disorder, moderate, dependence ([place]) 08/15/2015  . PTSD (post-traumatic stress disorder) 08/15/2015  . History of bipolar disorder 08/15/2015  . Pre-syncope 05/17/2015  . Skull deformity 05/17/2015  . Patellofemoral syndrome of both knees 05/02/2015    Past Medical History:  Diagnosis Date  . Anxiety   . Arthritis   . Asthma   . Bipolar disorder ([place])   . Bulging lumbar disc 07/19/05  . Chronic headaches   . Chronic lymphocytic leukemia ([place]) 07/31/2016  . Colon polyps   . Degenerative disorder of bone   . Depression   . History of borderline personality disorder   . Hypothyroidism 2007   Developed after use of Lithium  . IBS (irritable bowel syndrome) 1995  . Pneumonia   . Proctitis   . Shingles 07/2018  . Substance abuse ([place])    ativan last month    Past Surgical History:  Procedure Laterality Date  . ABDOMINAL HYSTERECTOMY    . APPENDECTOMY    . BUNIONECTOMY Right 06/2012  . CHOLECYSTECTOMY    . OOPHORECTOMY    . SHOULDER OPEN ROTATOR CUFF REPAIR Right 03/2010  . TUBAL LIGATION      Social History   Tobacco Use  . Smoking status: Former Smoker    Quit date: 07/20/1980    Years since quitting: 39.3  . Smokeless tobacco: Never Used  Substance Use Topics  . Alcohol use: Yes    Alcohol/week: 0.0 standard drinks  Comment: occ  . Drug use: Not Currently    Family History  Problem Relation Age of Onset  . Depression Mother   . Hypertension Mother   . Post-traumatic stress disorder Sister   . Alcohol abuse Brother   . Drug abuse Brother   . Post-traumatic stress disorder Brother   . Thyroid disease Father   . Alzheimer's disease Father   . COPD Maternal Grandmother   . Heart disease Maternal Grandfather   . Arthritis Paternal Grandmother   . Diabetes Paternal Grandmother   . Depression Paternal Grandmother   . Alzheimer's disease Paternal Grandmother   . Heart disease Paternal Grandfather   . Coronary artery disease Paternal Grandfather    . Alcohol abuse Paternal Grandfather   . Colon cancer Neg Hx   . Breast cancer Neg Hx     Allergies  Allergen Reactions  . Aripiprazole Anaphylaxis and Swelling  . Ciprofloxacin Shortness Of Breath  . Lamotrigine Rash and Other (See Comments)  . Nitrofurantoin Hives  . Other Anaphylaxis, Hives and Swelling    Walnuts and pine nuts  . Peanut Oil Anaphylaxis, Hives, Swelling and Rash  . Amoxicillin Hives and Rash    Has patient had a PCN reaction causing immediate rash, facial/tongue/throat swelling, SOB or lightheadedness with hypotension: Yes Has patient had a PCN reaction causing severe rash involving mucus membranes or skin necrosis: No Has patient had a PCN reaction that required hospitalization: No Has patient had a PCN reaction occurring within the last 10 years: No If all of the above answers are "NO", then may proceed with Cephalosporin use.  Marland Kitchen Doxycycline Hives and Rash  . Latex Rash and Other (See Comments)  . Meloxicam Itching and Other (See Comments)    Induces mania Interacts with Lithium  . Naproxen Hives and Other (See Comments)    Interacts with lithium  . Pramipexole Hives and Rash  . Prednisone Other (See Comments)    Interacts with Lithium  . Risperidone Hives and Rash  . Sulfa Antibiotics Rash    "that leaves scarring"  . Tape Itching and Rash    Steri-StripsT  . Cortisone Other (See Comments)    Exacerbates the mania of her bipolar  . Percocet [Oxycodone-Acetaminophen] Other (See Comments)    Severe dizziness  . Shingrix [Zoster Vac Recomb Adjuvanted] Swelling    rash    Medication list has been reviewed and updated.  Current Outpatient Medications on File Prior to Visit  Medication Sig Dispense Refill  . acalabrutinib (CALQUENCE) 100 MG capsule Take 100 mg by mouth 2 (two) times daily.    Marland Kitchen acetaminophen (TYLENOL) 500 MG tablet Take 1,000 mg by mouth every 6 (six) hours as needed for mild pain.    Marland Kitchen albuterol (PROVENTIL HFA;VENTOLIN HFA) 108 (90  Base) MCG/ACT inhaler Inhale 1-2 puffs into the lungs every 6 (six) hours as needed for wheezing or shortness of breath. 1 each 3  . allopurinol (ZYLOPRIM) 300 MG tablet Take 300 mg by mouth daily.    . diphenhydrAMINE (BENADRYL) 25 MG tablet Take 1 tablet (25 mg total) by mouth every 6 (six) hours as needed for itching. 20 tablet 0  . fluticasone furoate-vilanterol (BREO ELLIPTA) 100-25 MCG/INH AEPB Inhale 1 puff into the lungs daily. 1 each 5  . hyoscyamine (LEVSIN, ANASPAZ) 0.125 MG tablet Take 1 tablet (0.125 mg total) by mouth as needed for cramping. Reported on 09/24/2015 10 tablet 2  . levothyroxine (SYNTHROID) 25 MCG tablet Take 1 tablet (25 mcg total)  by mouth daily before breakfast. 30 tablet 2  . rizatriptan (MAXALT-MLT) 10 MG disintegrating tablet Take 1 tablet (10 mg total) by mouth as needed for migraine. May repeat in 2 hours if needed 9 tablet 11  . UNABLE TO FIND Take by mouth. ELDERBERRY FRUIT ORAL    . UNABLE TO FIND Med Name: Lithium 4.8 mg one daily    . valACYclovir (VALTREX) 1000 MG tablet Take 1 tablet (1,000 mg total) by mouth daily. 90 tablet 3   No current facility-administered medications on file prior to visit.    Review of Systems:  As per HPI- otherwise negative.   Physical Examination: Vitals:   11/09/19 1419  BP: 105/71  Pulse: 87  Resp: 18  Temp: (!) 97 F (36.1 C)  SpO2: 96%   Vitals:   11/09/19 1419  Weight: 167 lb (75.8 kg)  Height: 5' 3.5" (1.613 m)   Body mass index is 29.12 kg/m. Ideal Body Weight: Weight in (lb) to have BMI = 25: 143.1  GEN: no acute distress.  Overweight, appears generally well today Her cervical lymphadenopathy is stable I do not appreciate any axillary lymphadenopathy today Bilateral TM wnl, oropharynx normal.  PEERL,EOMI.   HEENT: Atraumatic, Normocephalic.  Ears and Nose: No external deformity. CV: RRR, No M/G/R. No JVD. No thrill. No extra heart sounds. PULM: CTA B, no wheezes, crackles, rhonchi. No  retractions. No resp. distress. No accessory muscle use. EXTR: No c/c/e PSYCH: Normally interactive. Conversant.  Her right upper lateral arm is erythematous, slightly warm, inflamed-     Shoulder, elbow range of motion normal  Assessment and Plan: Cellulitis of right arm - Plan: cephALEXin (KEFLEX) 500 MG capsule  Here today with likely reaction to recent but during the COVID-19 vaccine, mild.  She has a localized reaction with redness and warmth of her right upper arm, where she received the vaccine.  Charli has had a lot of health problems recently, she is worried about getting cellulitis.  She would like to use antibiotic if possible.  She has multiple drug allergies, but confirms that she has tolerated Keflex in the recent past without any sign of allergic reaction  I prescribed Keflex 500 3 times daily for 1 week We also discussed oral and topical Benadryl as needed  She is asked to contact me if not improving in the next few days, sooner if getting worse  Moderate medical decision making today  This visit occurred during the SARS-CoV-2 public health emergency.  Safety protocols were in place, including screening questions prior to the visit, additional usage of staff PPE, and extensive cleaning of exam room while observing appropriate contact time as indicated for disinfecting solutions.      Signed Lamar Blinks, MD

## 2019-11-13 DIAGNOSIS — R911 Solitary pulmonary nodule: Secondary | ICD-10-CM | POA: Diagnosis not present

## 2019-11-13 DIAGNOSIS — C9110 Chronic lymphocytic leukemia of B-cell type not having achieved remission: Secondary | ICD-10-CM | POA: Diagnosis not present

## 2019-11-13 DIAGNOSIS — K589 Irritable bowel syndrome without diarrhea: Secondary | ICD-10-CM | POA: Diagnosis not present

## 2019-11-13 DIAGNOSIS — I493 Ventricular premature depolarization: Secondary | ICD-10-CM | POA: Diagnosis not present

## 2019-11-13 DIAGNOSIS — B029 Zoster without complications: Secondary | ICD-10-CM | POA: Diagnosis not present

## 2019-11-13 DIAGNOSIS — R61 Generalized hyperhidrosis: Secondary | ICD-10-CM | POA: Diagnosis not present

## 2019-11-13 DIAGNOSIS — R109 Unspecified abdominal pain: Secondary | ICD-10-CM | POA: Diagnosis not present

## 2019-11-13 DIAGNOSIS — R21 Rash and other nonspecific skin eruption: Secondary | ICD-10-CM | POA: Diagnosis not present

## 2019-11-13 DIAGNOSIS — R002 Palpitations: Secondary | ICD-10-CM | POA: Diagnosis not present

## 2019-11-13 DIAGNOSIS — R202 Paresthesia of skin: Secondary | ICD-10-CM | POA: Diagnosis not present

## 2019-11-13 DIAGNOSIS — J45909 Unspecified asthma, uncomplicated: Secondary | ICD-10-CM | POA: Diagnosis not present

## 2019-11-13 DIAGNOSIS — R5383 Other fatigue: Secondary | ICD-10-CM | POA: Diagnosis not present

## 2019-11-13 DIAGNOSIS — R42 Dizziness and giddiness: Secondary | ICD-10-CM | POA: Diagnosis not present

## 2019-11-13 DIAGNOSIS — D7389 Other diseases of spleen: Secondary | ICD-10-CM | POA: Diagnosis not present

## 2019-11-13 DIAGNOSIS — R0609 Other forms of dyspnea: Secondary | ICD-10-CM | POA: Diagnosis not present

## 2019-11-13 DIAGNOSIS — R110 Nausea: Secondary | ICD-10-CM | POA: Diagnosis not present

## 2019-11-15 ENCOUNTER — Encounter: Payer: Self-pay | Admitting: General Practice

## 2019-11-15 NOTE — Progress Notes (Signed)
[place] Spiritual Care Note  Spoke with Morgan [name] ca 1hour per her request, providing opportunity for her to process personal updates about treatment and her physical experience. One significant note is that she has built a network of several meaningful relationships via support groups, which is very meaningful for her. Provided empathic listening, pastoral reflection, prayer, and encouragement. [name] reaches out readily for support and will phone again as needed.   [place], [place], [facility] Pager [phone] Voicemail [phone]

## 2019-11-16 ENCOUNTER — Ambulatory Visit: Payer: Medicare Other | Admitting: Psychiatry

## 2019-11-21 DIAGNOSIS — C9110 Chronic lymphocytic leukemia of B-cell type not having achieved remission: Secondary | ICD-10-CM | POA: Diagnosis not present

## 2019-11-23 ENCOUNTER — Ambulatory Visit: Payer: Medicare Other | Admitting: Psychiatry

## 2019-11-23 ENCOUNTER — Telehealth: Payer: Medicare Other

## 2019-11-25 ENCOUNTER — Encounter: Payer: Self-pay | Admitting: Family Medicine

## 2019-11-28 DIAGNOSIS — C9110 Chronic lymphocytic leukemia of B-cell type not having achieved remission: Secondary | ICD-10-CM | POA: Diagnosis not present

## 2019-11-30 ENCOUNTER — Ambulatory Visit: Payer: Medicare Other | Admitting: Psychiatry

## 2019-12-07 ENCOUNTER — Encounter: Payer: Self-pay | Admitting: Family Medicine

## 2019-12-07 ENCOUNTER — Other Ambulatory Visit: Payer: Self-pay

## 2019-12-07 ENCOUNTER — Ambulatory Visit (INDEPENDENT_AMBULATORY_CARE_PROVIDER_SITE_OTHER): Payer: Medicare Other | Admitting: Family Medicine

## 2019-12-07 VITALS — BP 106/69 | HR 94 | Resp 16 | Ht 63.5 in | Wt 167.0 lb

## 2019-12-07 DIAGNOSIS — R319 Hematuria, unspecified: Secondary | ICD-10-CM

## 2019-12-07 DIAGNOSIS — R350 Frequency of micturition: Secondary | ICD-10-CM

## 2019-12-07 DIAGNOSIS — E039 Hypothyroidism, unspecified: Secondary | ICD-10-CM

## 2019-12-07 DIAGNOSIS — C9110 Chronic lymphocytic leukemia of B-cell type not having achieved remission: Secondary | ICD-10-CM

## 2019-12-07 DIAGNOSIS — R000 Tachycardia, unspecified: Secondary | ICD-10-CM | POA: Diagnosis not present

## 2019-12-07 DIAGNOSIS — R0789 Other chest pain: Secondary | ICD-10-CM | POA: Diagnosis not present

## 2019-12-07 DIAGNOSIS — R002 Palpitations: Secondary | ICD-10-CM | POA: Diagnosis not present

## 2019-12-07 LAB — POCT URINALYSIS DIP (MANUAL ENTRY)
Bilirubin, UA: NEGATIVE
Glucose, UA: NEGATIVE mg/dL
Ketones, POC UA: NEGATIVE mg/dL
Leukocytes, UA: NEGATIVE
Nitrite, UA: NEGATIVE
Protein Ur, POC: NEGATIVE mg/dL
Spec Grav, UA: >=1.03 — AB (ref 1.010–1.025)
Urobilinogen, UA: 0.2 E.U./dL
pH, UA: 5.5 (ref 5.0–8.0)

## 2019-12-07 NOTE — Patient Instructions (Signed)
It was good to see you again today, I will be in touch with your thyroid and urine culture.  At the moment your urine does not appear to be infected, but we will get a culture to make sure  Let me know if any changes or worsening in the meantime

## 2019-12-07 NOTE — Progress Notes (Signed)
[facility] at [facility] [place], [place], [place] [address] [phone]  Date:  12/07/2019   Name:  Morgan [name]   DOB:  Oct 30, 1965   MRN:  [id number]  PCP:  Darreld Mclean, MD    Chief Complaint: Urinary Tract Infection (flank pain, urine odor, frequency)   History of Present Illness:  Morgan [name] is a 54 y.o. very pleasant female patient who presents with the following:  Patient with history of CLL, mood disorder, hypothyroidism I last saw her on April 22 with concern of possible vaccine reaction from her Covid vaccine  She is currently a patient at [facility] oncology-most recent visit there in April 26 as follows- she also was seen today by her cardiologist.  They plan to do a loop recorder implant for her for palpitations, she cannot really tolerate a Holter  # CLL ([place] unmutated) # On frontline treatment with ibrutinib plus rituximab [name] is a 54 yo with biopolar disorder, IBS, Asthma, PVCs, and CLL (normal FISH, IGHV unmutated, TP53 WT). She was stable since diagnosis in 07/2016 until she developed significant symptoms in 01/2018 ultimately leading to the decision to initiate treatment with her primary oncologist on ibrutinib and rituxmiab on 02/25/2018. She demonstrated a response to therapy in terms of reduction of the ALC and initially some improvement in symptoms. However she felt her mental health was suffering and therefore stopped all CLL directed therapy on 05/05/18. She transitioned primary oncology to Dr Linus Orn at [facility] for her CLL, and also was seen by Dr. Lars Masson in [place] at [facility] for recurrent VZV.   Today, 11/13/2019, she returns to follow up and potential initiation of therapy. Stress echo completed 11/06/19 normal; had issues wearing holter monitor and may look into a new option for screening. Denies any further palpitations. Dr. Lora Paula reports OK to treat with BTKi. Has 1-2 doses of valacyclovir left for shingles  re-infection, and reports lesions have crusted/improved.   Since there is evidence of progressing disease, treatment options discussed included acalabrutinib. ALC/WBC continues to increase. Hgb slightly lower today. Platelets and ANC remain normal.  Summary of recommendations: -- [name] is demonstrated progression based on symptoms, exam and lab results.  -- iwCLL 2018 treatment indications: progressive LAD, symptomatic; ALC doubling time --OK to start acalabrutinib 100 mg by mouth twice daily. --She has spoken to our clinical pharmacist Dr. Layne Benton regarding education of acalabrutinib. --TP53 negative. Pending chromosome analysis. --PPx:  -- Hepatitis B: not indicated; testing non-reactive today -- TLS: Continue Allopurinol 34m daily  -- Shingles: Finish valtrex 10028mTID x 10 days, then start 50016mwice daily for prophylaxis - [facility] -- she will continue to follow with Dr. ParLinus Ornnd TxID as primary team  Patients with CLL and other lymphoproliferative disorders are at risk of hypogammaglobulinemia and infections. The last IgG was  IgG  Date Value Ref Range Status  11/03/2019 395 (L) 588-1,573 mg/dL Final  Indications for IVIG were reviewed. She denies frequent infections. We will repeat if a concern of frequent or prolonged infections occur in the interval per iwCLL guidelines.  --continue age and sex appropriate HM with her PCP- she is going to look into receiving COVID-19 vaccination prior to treatment initiation. --She will return in 1 week for labs and LNW to re-evaluate labs after initiating acalabrutinib; then will return in 4 weeks for labs and visit with Dr. BraDorothea Ogle#Palpitations #Dizziness Evaluated  by cardio-oncology. Stress echocardiogram 4/19 normal. Holter monitor placed but patient had issues charging battery and keeping in place. Per Dr. Lora Paula, [place] to proceed with BTKi. She denies any recent  palpitations/dizziness. --Appreciate cardio-onc input  #Pulmonary Nodule (Incidental Finding) Sub-35m right upper lobe pulmonary nodule noted on imaging today.  - Recommend repeat CT in 1 year (~07/2020) to monitor stability   Today pt is concerned that she might have a UTI.  She notes back pain, but this is more chronic - she is using heat that helps her some She notes some possible urinary frequency and discomfort-not severe, she is wants to make sure all is okay No hematuria She started a new chemo drug about 3.5 weeks ago- she is having some side effects that she will manage with oncology   She walked several miles for exercise last week and felt stronger than she has in a while Patient Active Problem List   Diagnosis Date Noted  . Fever 06/18/2018  . Piriformis syndrome of right side 04/22/2017  . Lateral epicondylitis of left elbow 04/06/2017  . Solitary pulmonary nodule 10/29/2016  . Chronic lymphocytic leukemia ([facility] 07/31/2016  . Mass in neck 04/17/2016  . Abdominal mass 04/17/2016  . Asthma 12/12/2015  . Multiple environmental allergies 12/12/2015  . Chronic lower back pain 09/12/2015  . Bipolar 1 disorder, depressed ([place] 08/27/2015    Class: Chronic  . Other specified hypothyroidism 08/23/2015  . Borderline personality disorder ([facility] 08/15/2015  . Severe benzodiazepine use disorder ([facility] 08/15/2015  . Alcohol use disorder, moderate, dependence (HJay 08/15/2015  . PTSD (post-traumatic stress disorder) 08/15/2015  . History of bipolar disorder 08/15/2015  . Pre-syncope 05/17/2015  . Skull deformity 05/17/2015  . Patellofemoral syndrome of both knees 05/02/2015    Past Medical History:  Diagnosis Date  . Anxiety   . Arthritis   . Asthma   . Bipolar disorder ([place]   . Bulging lumbar disc 07/19/05  . Chronic headaches   . Chronic lymphocytic leukemia ([facility] 07/31/2016  . Colon polyps   . Degenerative disorder of bone   . Depression   . History of borderline  personality disorder   . Hypothyroidism 2007   Developed after use of Lithium  . IBS (irritable bowel syndrome) 1995  . Pneumonia   . Proctitis   . Shingles 07/2018  . Substance abuse ([place]    ativan last month    Past Surgical History:  Procedure Laterality Date  . ABDOMINAL HYSTERECTOMY    . APPENDECTOMY    . BUNIONECTOMY Right 06/2012  . CHOLECYSTECTOMY    . OOPHORECTOMY    . SHOULDER OPEN ROTATOR CUFF REPAIR Right 03/2010  . TUBAL LIGATION      Social History   Tobacco Use  . Smoking status: Former Smoker    Quit date: 07/20/1980    Years since quitting: 39.4  . Smokeless tobacco: Never Used  Substance Use Topics  . Alcohol use: Yes    Alcohol/week: 0.0 standard drinks    Comment: occ  . Drug use: Not Currently    Family History  Problem Relation Age of Onset  . Depression Mother   . Hypertension Mother   . Post-traumatic stress disorder Sister   . Alcohol abuse Brother   . Drug abuse Brother   . Post-traumatic stress disorder Brother   . Thyroid disease Father   . Alzheimer's disease Father   . COPD Maternal Grandmother   . Heart disease Maternal Grandfather   . Arthritis Paternal Grandmother   .  Diabetes Paternal Grandmother   . Depression Paternal Grandmother   . Alzheimer's disease Paternal Grandmother   . Heart disease Paternal Grandfather   . Coronary artery disease Paternal Grandfather   . Alcohol abuse Paternal Grandfather   . Colon cancer Neg Hx   . Breast cancer Neg Hx     Allergies  Allergen Reactions  . Aripiprazole Anaphylaxis and Swelling  . Ciprofloxacin Shortness Of Breath  . Lamotrigine Rash and Other (See Comments)  . Nitrofurantoin Hives  . Other Anaphylaxis, Hives and Swelling    Walnuts and pine nuts  . Peanut Oil Anaphylaxis, Hives, Swelling and Rash  . Amoxicillin Hives and Rash    Has patient had a PCN reaction causing immediate rash, facial/tongue/throat swelling, SOB or lightheadedness with hypotension: Yes Has patient  had a PCN reaction causing severe rash involving mucus membranes or skin necrosis: No Has patient had a PCN reaction that required hospitalization: No Has patient had a PCN reaction occurring within the last 10 years: No If all of the above answers are "NO", then may proceed with Cephalosporin use.  Marland Kitchen Doxycycline Hives and Rash  . Latex Rash and Other (See Comments)  . Meloxicam Itching and Other (See Comments)    Induces mania Interacts with Lithium  . Naproxen Hives and Other (See Comments)    Interacts with lithium  . Pramipexole Hives and Rash  . Prednisone Other (See Comments)    Interacts with Lithium  . Risperidone Hives and Rash  . Sulfa Antibiotics Rash    "that leaves scarring"  . Tape Itching and Rash    Steri-StripsT  . Cortisone Other (See Comments)    Exacerbates the mania of her bipolar  . Percocet [Oxycodone-Acetaminophen] Other (See Comments)    Severe dizziness  . Shingrix [Zoster Vac Recomb Adjuvanted] Swelling    rash    Medication list has been reviewed and updated.  Current Outpatient Medications on File Prior to Visit  Medication Sig Dispense Refill  . acalabrutinib (CALQUENCE) 100 MG capsule Take 100 mg by mouth 2 (two) times daily.    Marland Kitchen acetaminophen (TYLENOL) 500 MG tablet Take 1,000 mg by mouth every 6 (six) hours as needed for mild pain.    Marland Kitchen albuterol (PROVENTIL HFA;VENTOLIN HFA) 108 (90 Base) MCG/ACT inhaler Inhale 1-2 puffs into the lungs every 6 (six) hours as needed for wheezing or shortness of breath. 1 each 3  . allopurinol (ZYLOPRIM) 300 MG tablet Take 300 mg by mouth daily.    . cephALEXin (KEFLEX) 500 MG capsule Take 1 capsule (500 mg total) by mouth 3 (three) times daily. 21 capsule 0  . diphenhydrAMINE (BENADRYL) 25 MG tablet Take 1 tablet (25 mg total) by mouth every 6 (six) hours as needed for itching. 20 tablet 0  . fluticasone furoate-vilanterol (BREO ELLIPTA) 100-25 MCG/INH AEPB Inhale 1 puff into the lungs daily. 1 each 5  .  hyoscyamine (LEVSIN, ANASPAZ) 0.125 MG tablet Take 1 tablet (0.125 mg total) by mouth as needed for cramping. Reported on 09/24/2015 10 tablet 2  . levothyroxine (SYNTHROID) 25 MCG tablet Take 1 tablet (25 mcg total) by mouth daily before breakfast. 30 tablet 2  . rizatriptan (MAXALT-MLT) 10 MG disintegrating tablet Take 1 tablet (10 mg total) by mouth as needed for migraine. May repeat in 2 hours if needed 9 tablet 11  . UNABLE TO FIND Take by mouth. ELDERBERRY FRUIT ORAL    . UNABLE TO FIND Med Name: Lithium 4.8 mg one daily    .  valACYclovir (VALTREX) 1000 MG tablet Take 1 tablet (1,000 mg total) by mouth daily. 90 tablet 3   No current facility-administered medications on file prior to visit.    Review of Systems:  As per HPI- otherwise negative.   Physical Examination: Vitals:   12/07/19 1524  BP: 106/69  Pulse: 94  Resp: 16  SpO2: 100%   Vitals:   12/07/19 1524  Weight: 167 lb (75.8 kg)  Height: 5' 3.5" (1.613 m)   Body mass index is 29.12 kg/m. Ideal Body Weight: Weight in (lb) to have BMI = 25: 143.1  GEN: no acute distress.  Overweight, looks well and normal self HEENT: Atraumatic, Normocephalic.  Ears and Nose: No external deformity. CV: RRR, No M/G/R. No JVD. No thrill. No extra heart sounds. PULM: CTA B, no wheezes, crackles, rhonchi. No retractions. No resp. distress. No accessory muscle use. ABD: S, NT, ND, +BS. No rebound. No HSM.  Belly is benign EXTR: No c/c/e PSYCH: Normally interactive. Conversant.   BP Readings from Last 3 Encounters:  12/07/19 106/69  11/09/19 105/71  07/26/19 115/68   Results for orders placed or performed in visit on 12/07/19  POCT urinalysis dipstick  Result Value Ref Range   Color, UA yellow yellow   Clarity, UA clear clear   Glucose, UA negative negative mg/dL   Bilirubin, UA negative negative   Ketones, POC UA negative negative mg/dL   Spec Grav, UA >=1.030 (A) 1.010 - 1.025   Blood, UA trace-intact (A) negative   pH,  UA 5.5 5.0 - 8.0   Protein Ur, POC negative negative mg/dL   Urobilinogen, UA 0.2 0.2 or 1.0 E.U./dL   Nitrite, UA Negative Negative   Leukocytes, UA Negative Negative    Assessment and Plan: Urinary frequency - Plan: Urine Culture, POCT urinalysis dipstick, Urine Microscopic Only  Hematuria, unspecified type - Plan: Urine Microscopic Only, CANCELED: Urine Microscopic Only  Acquired hypothyroidism - Plan: TSH  CLL (chronic lymphocytic leukemia) (HCC)  Here today with possible concern of UTI.  Urine dip is benign, will check culture and micro due to microhematuria.  TSH pending for routine thyroid check Will be in touch with her pending her lab results-we will prescribe antibiotic if indicated  Moderate medical decision making today   This visit occurred during the SARS-CoV-2 public health emergency.  Safety protocols were in place, including screening questions prior to the visit, additional usage of staff PPE, and extensive cleaning of exam room while observing appropriate contact time as indicated for disinfecting solutions.    Signed Lamar Blinks, MD

## 2019-12-08 LAB — URINALYSIS, MICROSCOPIC ONLY
Bacteria, UA: NONE SEEN /HPF
Hyaline Cast: NONE SEEN /LPF
RBC / HPF: NONE SEEN /HPF (ref 0–2)
Squamous Epithelial / HPF: NONE SEEN /HPF (ref <=5)
WBC, UA: NONE SEEN /HPF (ref 0–5)

## 2019-12-08 LAB — URINE CULTURE
MICRO NUMBER:: 10501131
Result:: NO GROWTH
SPECIMEN QUALITY:: ADEQUATE

## 2019-12-08 LAB — TEST AUTHORIZATION

## 2019-12-08 LAB — TSH: TSH: 2.24 u[IU]/mL (ref 0.35–4.50)

## 2019-12-08 LAB — EXTRA URINE SPECIMEN

## 2019-12-10 DIAGNOSIS — R079 Chest pain, unspecified: Secondary | ICD-10-CM | POA: Diagnosis not present

## 2019-12-10 DIAGNOSIS — R002 Palpitations: Secondary | ICD-10-CM | POA: Diagnosis not present

## 2019-12-11 ENCOUNTER — Encounter: Payer: Self-pay | Admitting: General Practice

## 2019-12-11 ENCOUNTER — Encounter: Payer: Self-pay | Admitting: Family Medicine

## 2019-12-11 DIAGNOSIS — C9110 Chronic lymphocytic leukemia of B-cell type not having achieved remission: Secondary | ICD-10-CM | POA: Diagnosis not present

## 2019-12-11 DIAGNOSIS — B028 Zoster with other complications: Secondary | ICD-10-CM | POA: Diagnosis not present

## 2019-12-11 NOTE — Progress Notes (Signed)
[place] Spiritual Care Note  Received and returned call from [facility] for emotional support in processing recent side effects and appointment at [facility]. Provided empathic listening, normalization of feelings, encouragement, and affirmation of strengths, the last of which she asked me to type up and email to her so she can add it to her affirmations wall. Great use of tools! [place] has built a Engineer, site for herself and is approaching this round of treatment with courage, fortitude, and insight. We will continue to follow up as needed.   [place], [place], [facility] Pager [phone] Voicemail [phone]

## 2019-12-15 ENCOUNTER — Encounter: Payer: Self-pay | Admitting: General Practice

## 2019-12-15 NOTE — Progress Notes (Signed)
[place] Spiritual Care Note  Received call from [facility] requesting help with spiritual stuckness. Talked through ideas and strategies for reconnecting with self and God, including practicing the self-care of a nap after work, spending time in nature, and trying a Environmental education officer (sent link to free resource). I will continue to hold [place] and her work in [place], and we plan to follow up by phone next week if her work schedule allows.   [place], [place], [facility] Pager [phone] Voicemail [phone]

## 2019-12-22 DIAGNOSIS — R42 Dizziness and giddiness: Secondary | ICD-10-CM | POA: Diagnosis not present

## 2019-12-22 DIAGNOSIS — R55 Syncope and collapse: Secondary | ICD-10-CM | POA: Diagnosis not present

## 2019-12-22 DIAGNOSIS — K769 Liver disease, unspecified: Secondary | ICD-10-CM | POA: Diagnosis not present

## 2019-12-22 DIAGNOSIS — C9110 Chronic lymphocytic leukemia of B-cell type not having achieved remission: Secondary | ICD-10-CM | POA: Diagnosis not present

## 2019-12-22 DIAGNOSIS — K589 Irritable bowel syndrome without diarrhea: Secondary | ICD-10-CM | POA: Diagnosis not present

## 2019-12-22 DIAGNOSIS — R0789 Other chest pain: Secondary | ICD-10-CM | POA: Diagnosis not present

## 2019-12-22 DIAGNOSIS — I081 Rheumatic disorders of both mitral and tricuspid valves: Secondary | ICD-10-CM | POA: Diagnosis not present

## 2019-12-22 DIAGNOSIS — R079 Chest pain, unspecified: Secondary | ICD-10-CM | POA: Diagnosis not present

## 2019-12-22 DIAGNOSIS — R002 Palpitations: Secondary | ICD-10-CM | POA: Diagnosis not present

## 2019-12-25 DIAGNOSIS — C9110 Chronic lymphocytic leukemia of B-cell type not having achieved remission: Secondary | ICD-10-CM | POA: Diagnosis not present

## 2019-12-25 DIAGNOSIS — D801 Nonfamilial hypogammaglobulinemia: Secondary | ICD-10-CM | POA: Diagnosis not present

## 2019-12-26 ENCOUNTER — Encounter: Payer: Self-pay | Admitting: General Practice

## 2019-12-26 NOTE — Progress Notes (Signed)
[place] Spiritual Care Note  Returned call from [place], providing spiritual and emotional support related to treatment/side effect stressors. Continuing to follow as an additional layer of support.   [place], [place], [facility] Pager [phone] Voicemail [phone]

## 2019-12-27 DIAGNOSIS — D849 Immunodeficiency, unspecified: Secondary | ICD-10-CM | POA: Diagnosis not present

## 2019-12-27 DIAGNOSIS — B029 Zoster without complications: Secondary | ICD-10-CM | POA: Diagnosis not present

## 2019-12-27 DIAGNOSIS — C9110 Chronic lymphocytic leukemia of B-cell type not having achieved remission: Secondary | ICD-10-CM | POA: Diagnosis not present

## 2020-01-02 ENCOUNTER — Other Ambulatory Visit: Payer: Self-pay

## 2020-01-02 ENCOUNTER — Ambulatory Visit: Payer: Medicare Other

## 2020-01-02 DIAGNOSIS — E039 Hypothyroidism, unspecified: Secondary | ICD-10-CM

## 2020-01-02 DIAGNOSIS — J45909 Unspecified asthma, uncomplicated: Secondary | ICD-10-CM

## 2020-01-02 NOTE — Chronic Care Management (AMB) (Signed)
Chronic Care Management Pharmacy  Name: Morgan [name]  MRN: [id number] DOB: 10/27/1965  Chief Complaint/ HPI  Morgan [name],  54 y.o. , female presents for their Initial CCM visit with the clinical pharmacist via telephone due to COVID-19 Pandemic.  PCP : Darreld Mclean, MD  Their chronic conditions include: Asthma, Hypothyroidism, Bipolar Disorder, Leukemia, Stomach Cramping, Recurrent Shingles, Migraine, Risk of Anaphylaxis  Office Visits: 12/07/19: Visit w/ Dr. Lorelei Pont - Urinary Frequency. Labs ordered (POCT urinalysis, urine culture). No med changes noted  11/09/19: Visit w/ Dr. Lorelei Pont - Cellulitis of R arm post 2nd covid vaccine. Prescribed cephalexin 500mg  TID x 7 days. Also discussed topical benadryl PRN.  11/06/19: Visit w/ Dr. Lorelei Pont - Concern for thyroid and recurrent shingles. Pt had reaction on arm to covid vaccine. Prescribed levothyorxine 52mcg and valtrex.Repeat TSH in 1 month. RTC if arm reaction worsens  Consult Visit: 12/27/19: [facility] visit - Initial consult  12/25/19: [facility] visit w/ Dr. Alvie Heidelberg - First dose of IVIG per Dr. Dorothea Ogle. Mild pain with recent implantable loop recorder on 12/22/19. Discontinued allopurinol and lorazepam (therapy completed)  12/22/19: [facility] - EP loop monitor implant w/ Dr. Westley Gambles  12/11/19: [facility] visit w/ Dr. Dorothea Ogle - Plan to start IVIG for IgG levels and recurrent shingles. Valtrex 1000mg  TID x 7D then return to prophylactic dose. Referral to translplant ID for evaluation of recurrent shingles.   12/07/19: [place] visit w/ Dr. Westley Gambles - Initial consult. Discussion of arrhythmias and option for ILR.   Medications: Outpatient Encounter Medications as of 01/02/2020  Medication Sig Note  . acalabrutinib (CALQUENCE) 100 MG capsule Take 100 mg by mouth 2 (two) times daily.   Marland Kitchen acetaminophen (TYLENOL) 500 MG tablet Take 1,000 mg by mouth every 6 (six) hours as needed for mild pain.   .  diphenhydrAMINE (BENADRYL) 25 MG tablet Take 1 tablet (25 mg total) by mouth every 6 (six) hours as needed for itching.   . ondansetron (ZOFRAN) 8 MG tablet Take by mouth every 8 (eight) hours as needed for nausea or vomiting.   Marland Kitchen UNABLE TO FIND Med Name: Lithium 4.8 mg one daily 07/26/2019: 1 cap daily  . valACYclovir (VALTREX) 1000 MG tablet Take 1 tablet (1,000 mg total) by mouth daily. (Patient taking differently: Take 500 mg by mouth 2 (two) times daily. Has 500mg  tabs for prophylatic dosing)   . [DISCONTINUED] albuterol (PROVENTIL HFA;VENTOLIN HFA) 108 (90 Base) MCG/ACT inhaler Inhale 1-2 puffs into the lungs every 6 (six) hours as needed for wheezing or shortness of breath.   . [DISCONTINUED] fluticasone furoate-vilanterol (BREO ELLIPTA) 100-25 MCG/INH AEPB Inhale 1 puff into the lungs daily.   . [DISCONTINUED] hyoscyamine (LEVSIN, ANASPAZ) 0.125 MG tablet Take 1 tablet (0.125 mg total) by mouth as needed for cramping. Reported on 09/24/2015   . [DISCONTINUED] levothyroxine (SYNTHROID) 25 MCG tablet Take 1 tablet (25 mcg total) by mouth daily before breakfast.   . [DISCONTINUED] rizatriptan (MAXALT-MLT) 10 MG disintegrating tablet Take 1 tablet (10 mg total) by mouth as needed for migraine. May repeat in 2 hours if needed   . UNABLE TO FIND Take by mouth. ELDERBERRY FRUIT ORAL   . [DISCONTINUED] allopurinol (ZYLOPRIM) 300 MG tablet Take 300 mg by mouth daily.   . [DISCONTINUED] cephALEXin (KEFLEX) 500 MG capsule Take 1 capsule (500 mg total) by mouth 3 (three) times daily.    No facility-administered encounter medications on file as of 01/02/2020.     Current Diagnosis/Assessment:  Goals  Addressed            This Visit's Progress   . Chronic Care Mangement Pharmacy Care Plan       CARE PLAN ENTRY  Current Barriers:  . Chronic Disease Management support, education, and care coordination needs related to Asthma, Hypothyroidism, Bipolar Disorder, Leukemia, Stomach Cramping, Recurrent  Shingles, Migraine, Allergy  Asthma . Pharmacist Clinical Goal(s) o Over the next 120 days, patient will work with PharmD and providers to reduce symptoms of asthma . Current regimen:  o Albuterol inhaler (ProAir) as needed, Breo Ellipta 100-31mcg 1 puff daily . Patient self care activities - Over the next 120 days, patient will: o Maintain asthma medication regimen  Migraine . Pharmacist Clinical Goal(s) o Over the next 120 days, patient will work with PharmD and providers to reduce symptoms of migraine . Current regimen:  o Rizatriptan 10mg  as needed, excedrin migraine as needed . Interventions: o Recommended patient to continue follow up with Dr. Leta Baptist . Patient self care activities - Over the next 120 days, patient will: o Maintain migraine medication regimen  Hypothyroidism . Pharmacist Clinical Goal(s) o Over the next 120 days, patient will work with PharmD and providers to reduce symptoms of hypothyroidism . Current regimen:  o Levothyroxine 45mcg daily . Patient self care activities - Over the next 120 days, patient will: o Maintain thyroid medication regimen    Medication management . Pharmacist Clinical Goal(s): o Over the next 120 days, patient will work with PharmD and providers to maintain optimal medication adherence . Current pharmacy: [facility] . Interventions o Comprehensive medication review performed. o Continue current medication management strategy . Patient self care activities - Over the next 120 days, patient will: o Focus on medication adherence by filling and taking medications appropriately  o Take medications as prescribed o Report any questions or concerns to PharmD and/or provider(s)  Initial goal documentation      Social Hx:  [place] to [place].  Originally from [place]. Lived in [place] and [place] where she raised her kids.  Has lived in [place] for 9 years.  Son in ER often, so she decided to stay on [place]  for him.  She moved to [place] after 3 months in [place]. Has a son ([place]) and daughter (lives locally).  Exercise:  Started an exercise workout yesterday. Does 10 minutes 3 times per Assad Harbeson of jumping on small trampoline.   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  Asthma / Tobacco   Last spirometry score: 10/29/2016 FCV: 3.2 FEV1: 2.8 FEV1/FVC: 87%  Eosinophil count:   Lab Results  Component Value Date/Time   EOSPCT 0.6 09/12/2019 02:43 PM   EOSPCT 2.2 09/04/2016 07:54 AM  %                               Eos (Absolute):  Lab Results  Component Value Date/Time   EOSABS 0.5 09/12/2019 02:43 PM   EOSABS 0.3 09/04/2016 07:54 AM    Tobacco Status:  Social History   Tobacco Use  Smoking Status Former Smoker  . Quit date: 07/20/1980  . Years since quitting: 39.4  Smokeless Tobacco Never Used    Patient has failed these meds in past: None noted  Patient is currently controlled on the following medications: Albuterol inhaler (ProAir) as needed, Breo Ellipta 100-32mcg 1 puff daily Using maintenance inhaler regularly? No Frequency of rescue inhaler  use:  infrequently (during the spring and fall)  Feels like the heat affects her asthma, so she tries to stay inside. Prefers to use Breo as rescue inhaler. States she is using Breo more frequently due to having to use mask more often. She feels like the Breo affects her bipolar disorder.  ProAir inhaler will expire this month.  Breo Ellipta will expire next month.  Pt has appt with PCP and will mention need for refills.  Plan -Continue current medications   Hypothyroidism   Lab Results  Component Value Date/Time   TSH 2.24 12/07/2019 04:57 PM   TSH 3.220 07/26/2019 01:07 PM   TSH 2.80 03/01/2019 01:46 PM    Patient has failed these meds in past: None noted  Patient is currently controlled on the following medications: Levothyroxine 47mcg daily  Plan -Continue current medications   Bipolar  Disorder    Patient has failed these meds in past: None noted  Patient is currently controlled on the following medications: Lithium 4.8mg  daily  Tried prescription lithium and it stopped working.  She feels like this is faster working with fewer side effects. She took a one week break from taking the lithium, then started back last week.  Plan -Continue current medications   Leukemia    Patient has failed these meds in past: None noted  Patient is currently controlled on the following medications: Acalabrutinib 100mg  twice daily, ondansetron 8mg  as needed  Plan -Continue current medications   Stomach Cramping    Patient has failed these meds in past: None noted  Patient is currently controlled on the following medications: Hyoscyamine 0.125mg  as needed  "This is my wonder drug" She has to take this at first sign of pain. The pain is so severe that she can't do anything afterwards. She has one remaining.   To get refills from Dr. Lorelei Pont  Plan -Continue current medications  Recurrent Shingles    Patient has failed these meds in past: None noted  Patient is currently uncontrolled on the following medications: Valacyclovir 500mg  twice daily  She states she is going to receive IVIG monthly.Next on 01/29/20 Has had shingles x 18 months.  High dose tx: valacyclovir 1000mg  TID If she has a recurrence was told she would do a course of 10-14 days vs 7 days.    Plan -Continue current medications   Migraine    Patient has failed these meds in past: None noted  Patient is currently uncontrolled on the following medications: Rizatriptan 10mg  as needed, excedrin migraine as needed  Followed by neuro Dr. Earlean Polka. Pen[place]alli. Does not have followup appt scheduled.  States that excedrin works better than tylenol. Last dose was June 4 after her procedure.  She is starting to have more frequent migraines. With her worst migraines she can't function. She will try tylenol  first and if that does not resolve it then she would move on to rizatriptan.    Plan -Continue current medications  -Continue followup with Dr. Leta Baptist  Risk of Anaphylaxis    Patient has failed these meds in past: None noted  Patient is currently stable on the following medications: None noted   States her epipen is expired.  Wonders if she should get a new pen.  Discussed risk/benefit of expired pen vs new pen  To get refill from Dr. Lorelei Pont  Plan -Continue current management   Miscellaneous Meds Tylenol Benadryl (for allergies and sleep. Hasn't used since reaction with vaccine) Cephalexin (never took, but has it home  in case of cellulitis noting she is at increased risk for infection)  Meds to D/C from list Allopurinol (only takes during first month of treatment)

## 2020-01-04 ENCOUNTER — Other Ambulatory Visit: Payer: Self-pay

## 2020-01-04 ENCOUNTER — Ambulatory Visit (INDEPENDENT_AMBULATORY_CARE_PROVIDER_SITE_OTHER): Payer: Medicare Other | Admitting: Family Medicine

## 2020-01-04 ENCOUNTER — Encounter: Payer: Self-pay | Admitting: Family Medicine

## 2020-01-04 VITALS — BP 112/70 | HR 80 | Temp 97.0°F | Resp 16 | Ht 63.5 in | Wt 172.0 lb

## 2020-01-04 DIAGNOSIS — E039 Hypothyroidism, unspecified: Secondary | ICD-10-CM

## 2020-01-04 DIAGNOSIS — Z95818 Presence of other cardiac implants and grafts: Secondary | ICD-10-CM | POA: Diagnosis not present

## 2020-01-04 DIAGNOSIS — Z889 Allergy status to unspecified drugs, medicaments and biological substances status: Secondary | ICD-10-CM

## 2020-01-04 DIAGNOSIS — G43909 Migraine, unspecified, not intractable, without status migrainosus: Secondary | ICD-10-CM

## 2020-01-04 DIAGNOSIS — R590 Localized enlarged lymph nodes: Secondary | ICD-10-CM | POA: Diagnosis not present

## 2020-01-04 DIAGNOSIS — K3189 Other diseases of stomach and duodenum: Secondary | ICD-10-CM

## 2020-01-04 DIAGNOSIS — J45909 Unspecified asthma, uncomplicated: Secondary | ICD-10-CM

## 2020-01-04 MED ORDER — FLUTICASONE FUROATE-VILANTEROL 100-25 MCG/INH IN AEPB: 1.0000 | INHALATION_SPRAY | Freq: Every day | RESPIRATORY_TRACT | 11 refills | Status: DC

## 2020-01-04 MED ORDER — LEVOTHYROXINE SODIUM 25 MCG PO TABS: 25.0000 ug | ORAL_TABLET | Freq: Every day | ORAL | 2 refills | Status: DC

## 2020-01-04 MED ORDER — EPINEPHRINE 0.3 MG/0.3ML IJ SOAJ: 0.3000 mg | INTRAMUSCULAR | 99 refills | Status: DC | PRN

## 2020-01-04 MED ORDER — ALBUTEROL SULFATE HFA 108 (90 BASE) MCG/ACT IN AERS: 1.0000 | INHALATION_SPRAY | Freq: Four times a day (QID) | RESPIRATORY_TRACT | 6 refills | Status: DC | PRN

## 2020-01-04 MED ORDER — RIZATRIPTAN BENZOATE 10 MG PO TBDP: 10.0000 mg | ORAL_TABLET | ORAL | 11 refills | Status: DC | PRN

## 2020-01-04 MED ORDER — HYOSCYAMINE SULFATE 0.125 MG PO TABS: 0.1250 mg | ORAL_TABLET | ORAL | 2 refills | Status: DC | PRN

## 2020-01-04 NOTE — Patient Instructions (Signed)
Great to see you today- keep me posted about how you are doing.  Assuming all is well let's visit in 4 months

## 2020-01-04 NOTE — Progress Notes (Signed)
[facility] [place], [place], [place] [address] [phone]  Date:  01/04/2020   Name:  Morgan [name]   DOB:  [birth year]   MRN:  [id number]  PCP:  Morgan Mclean, MD    Chief Complaint: Follow-up   History of Present Illness:  Morgan [name] is a 54 y.o. very pleasant female patient who presents with the following:  Patient with relatively complex medical history here today for follow-up History of CLL, mood disorder, hypothyroidism I saw her most recently about 1 month ago for concern of UTI  She received her first dose of IVIG for CLL treatment on June 7 She tolerated this treatment pretty well She saw cardiology on June 4 for palpitations-they did a cardiac loop recorder implant- done on June 4th.  This will be in place for about 3 years.  She feels like she tolerated the procedure well Pt would like me to check on her site today and make sure it is healing ok She was treated with a vancomycin infusion during the procedure and she had an allergic reaction.  They changed to a new abx and she did ok  She notes that this past Tuesday (today is Thursday) she may have overdone it with exercise  She was jumping on her trampoline quite a bit-about 40 minutes total over the course of the day.  This caused her to feel sore over her loop recorder We think the bouncing up and down irritated her surgical site  She is not working right now- she is trying to get plenty of exercise and she is also working on writing a book  She is done with her covid series   Lab Results  Component Value Date   TSH 2.24 12/07/2019     Patient Active Problem List   Diagnosis Date Noted  . Fever 06/18/2018  . Piriformis syndrome of right side 04/22/2017  . Lateral epicondylitis of left elbow 04/06/2017  . Solitary pulmonary nodule 10/29/2016  . Chronic lymphocytic leukemia ([place]) 07/31/2016  . Mass in neck 04/17/2016  .  Abdominal mass 04/17/2016  . Asthma 12/12/2015  . Multiple environmental allergies 12/12/2015  . Chronic lower back pain 09/12/2015  . Bipolar 1 disorder, depressed ([place]) 08/27/2015    Class: Chronic  . Other specified hypothyroidism 08/23/2015  . Borderline personality disorder ([place]) 08/15/2015  . Severe benzodiazepine use disorder ([place]) 08/15/2015  . Alcohol use disorder, moderate, dependence ([place]) 08/15/2015  . PTSD (post-traumatic stress disorder) 08/15/2015  . History of bipolar disorder 08/15/2015  . Pre-syncope 05/17/2015  . Skull deformity 05/17/2015  . Patellofemoral syndrome of both knees 05/02/2015    Past Medical History:  Diagnosis Date  . Anxiety   . Arthritis   . Asthma   . Bipolar disorder ([place])   . Bulging lumbar disc 07/19/05  . Chronic headaches   . Chronic lymphocytic leukemia ([place]) 07/31/2016  . Colon polyps   . Degenerative disorder of bone   . Depression   . History of borderline personality disorder   . Hypothyroidism 2007   Developed after use of Lithium  . IBS (irritable bowel syndrome) 1995  . Pneumonia   . Proctitis   . Shingles 07/2018  . Substance abuse ([place])    ativan last month    Past Surgical History:  Procedure Laterality Date  . ABDOMINAL HYSTERECTOMY    . APPENDECTOMY    . BUNIONECTOMY Right 06/2012  . CHOLECYSTECTOMY    .  OOPHORECTOMY    . SHOULDER OPEN ROTATOR CUFF REPAIR Right 03/2010  . TUBAL LIGATION      Social History   Tobacco Use  . Smoking status: Former Smoker    Quit date: 07/20/1980    Years since quitting: 39.4  . Smokeless tobacco: Never Used  Vaping Use  . Vaping Use: Never used  Substance Use Topics  . Alcohol use: Yes    Alcohol/week: 0.0 standard drinks    Comment: occ  . Drug use: Not Currently    Family History  Problem Relation Age of Onset  . Depression Mother   . Hypertension Mother   . Post-traumatic stress disorder Sister   . Alcohol abuse Brother   . Drug abuse Brother   .  Post-traumatic stress disorder Brother   . Thyroid disease Father   . Alzheimer's disease Father   . COPD Maternal Grandmother   . Heart disease Maternal Grandfather   . Arthritis Paternal Grandmother   . Diabetes Paternal Grandmother   . Depression Paternal Grandmother   . Alzheimer's disease Paternal Grandmother   . Heart disease Paternal Grandfather   . Coronary artery disease Paternal Grandfather   . Alcohol abuse Paternal Grandfather   . Colon cancer Neg Hx   . Breast cancer Neg Hx     Allergies  Allergen Reactions  . Aripiprazole Anaphylaxis and Swelling  . Ciprofloxacin Shortness Of Breath  . Lamotrigine Rash and Other (See Comments)  . Nitrofurantoin Hives  . Other Anaphylaxis, Hives and Swelling    Walnuts and pine nuts  . Peanut Oil Anaphylaxis, Hives, Swelling and Rash  . Amoxicillin Hives and Rash    Has patient had a PCN reaction causing immediate rash, facial/tongue/throat swelling, SOB or lightheadedness with hypotension: Yes Has patient had a PCN reaction causing severe rash involving mucus membranes or skin necrosis: No Has patient had a PCN reaction that required hospitalization: No Has patient had a PCN reaction occurring within the last 10 years: No If all of the above answers are "NO", then may proceed with Cephalosporin use.  Marland Kitchen Doxycycline Hives and Rash  . Latex Rash and Other (See Comments)  . Meloxicam Itching and Other (See Comments)    Induces mania Interacts with Lithium  . Naproxen Hives and Other (See Comments)    Interacts with lithium  . Pramipexole Hives and Rash  . Prednisone Other (See Comments)    Interacts with Lithium  . Risperidone Hives and Rash  . Sulfa Antibiotics Rash    "that leaves scarring"  . Tape Itching and Rash    Steri-StripsT  . Cortisone Other (See Comments)    Exacerbates the mania of her bipolar  . Percocet [Oxycodone-Acetaminophen] Other (See Comments)    Severe dizziness  . Shingrix [Zoster Vac Recomb  Adjuvanted] Swelling    rash  . Vancomycin Itching    Itching on head and at IV insertion site where Vanc running    Medication list has been reviewed and updated.  Current Outpatient Medications on File Prior to Visit  Medication Sig Dispense Refill  . acalabrutinib (CALQUENCE) 100 MG capsule Take 100 mg by mouth 2 (two) times daily.    Marland Kitchen acetaminophen (TYLENOL) 500 MG tablet Take 1,000 mg by mouth every 6 (six) hours as needed for mild pain.    Marland Kitchen albuterol (PROVENTIL HFA;VENTOLIN HFA) 108 (90 Base) MCG/ACT inhaler Inhale 1-2 puffs into the lungs every 6 (six) hours as needed for wheezing or shortness of breath. 1 each 3  .  diphenhydrAMINE (BENADRYL) 25 MG tablet Take 1 tablet (25 mg total) by mouth every 6 (six) hours as needed for itching. 20 tablet 0  . fluticasone furoate-vilanterol (BREO ELLIPTA) 100-25 MCG/INH AEPB Inhale 1 puff into the lungs daily. 1 each 5  . hyoscyamine (LEVSIN, ANASPAZ) 0.125 MG tablet Take 1 tablet (0.125 mg total) by mouth as needed for cramping. Reported on 09/24/2015 10 tablet 2  . levothyroxine (SYNTHROID) 25 MCG tablet Take 1 tablet (25 mcg total) by mouth daily before breakfast. 30 tablet 2  . ondansetron (ZOFRAN) 8 MG tablet Take by mouth every 8 (eight) hours as needed for nausea or vomiting.    . rizatriptan (MAXALT-MLT) 10 MG disintegrating tablet Take 1 tablet (10 mg total) by mouth as needed for migraine. May repeat in 2 hours if needed 9 tablet 11  . UNABLE TO FIND Take by mouth. ELDERBERRY FRUIT ORAL    . UNABLE TO FIND Med Name: Lithium 4.8 mg one daily    . valACYclovir (VALTREX) 1000 MG tablet Take 1 tablet (1,000 mg total) by mouth daily. (Patient taking differently: Take 500 mg by mouth 2 (two) times daily. Has 500mg  tabs for prophylatic dosing) 90 tablet 3   No current facility-administered medications on file prior to visit.    Review of Systems:  As per HPI- otherwise negative.   Physical Examination: Vitals:   01/04/20 1359  BP:  112/70  Pulse: 80  Resp: 16  Temp: (!) 97 F (36.1 C)  SpO2: 98%   Vitals:   01/04/20 1359  Weight: 172 lb (78 kg)  Height: 5' 3.5" (1.613 m)   Body mass index is 29.99 kg/m. Ideal Body Weight: Weight in (lb) to have BMI = 25: 143.1  GEN: no acute distress.  Mild overweight, looks well and her normal self HEENT: Atraumatic, Normocephalic.  Ears and Nose: No external deformity.  Her cervical LAD is much better than normal today CV: RRR, No M/G/R. No JVD. No thrill. No extra heart sounds. PULM: CTA B, no wheezes, crackles, rhonchi. No retractions. No resp. distress. No accessory muscle use. ABD: S, NT, ND, +BS. No rebound. No HSM. EXTR: No c/c/e PSYCH: Normally interactive. Conversant.  Left breast loop recorder implant site looks great- removed steri strips for her.  No sign of infection, healing well.     Assessment and Plan: Status post placement of implantable loop recorder  LAD (lymphadenopathy), cervical  Acquired hypothyroidism - Plan: levothyroxine (SYNTHROID) 25 MCG tablet  Mild asthma without complication, unspecified whether persistent - Plan: fluticasone furoate-vilanterol (BREO ELLIPTA) 100-25 MCG/INH AEPB, albuterol (VENTOLIN HFA) 108 (90 Base) MCG/ACT inhaler  Spasm of GI tract - Plan: hyoscyamine (LEVSIN) 0.125 MG tablet  Multiple allergies - Plan: EPINEPHrine 0.3 mg/0.3 mL IJ SOAJ injection  Migraine without status migrainosus, not intractable, unspecified migraine type - Plan: rizatriptan (MAXALT-MLT) 10 MG disintegrating tablet   Here today for a follow-up visit.  Patient recently had a loop recorder implanted in her left chest wall, I examined the site and appears to be healing nicely.  She will let me know if any concerns about this Her cervical lymphadenopathy is better than usual, she has recently started IVIG therapy for her CLL Patient needs a few refills as above She has had more headaches recently during her cancer treatment, needs refill of  Maxalt We plan to recheck in about 4 months assuming all is well This visit occurred during the SARS-CoV-2 public health emergency.  Safety protocols were in place, including screening  questions prior to the visit, additional usage of staff PPE, and extensive cleaning of exam room while observing appropriate contact time as indicated for disinfecting solutions.    Signed Lamar Blinks, MD

## 2020-01-08 ENCOUNTER — Encounter: Payer: Self-pay | Admitting: General Practice

## 2020-01-08 NOTE — Progress Notes (Signed)
[facility] Spiritual Care Note  Had >60 minute Spiritual Care Virtual Visit with [facility] via webex per her request, addressing spiritual concerns related to treatment and congregational participation. Provided empathic listening, normalization of feelings, affirmation of strengths, external perspective, and theological reflection. We plan to follow up informally in [facility] support group on Thursday, and Bonnita knows to reach out as needed.   [place], [place], [facility] Pager [phone] Voicemail [phone]

## 2020-01-09 NOTE — Patient Instructions (Signed)
Visit Information  Goals Addressed            This Visit's Progress    Chronic Care Mangement Pharmacy Care Plan       CARE PLAN ENTRY  Current Barriers:   Chronic Disease Management support, education, and care coordination needs related to Asthma, Hypothyroidism, Bipolar Disorder, Leukemia, Stomach Cramping, Recurrent Shingles, Migraine, Allergy  Asthma  Pharmacist Clinical Goal(s) o Over the next 120 days, patient will work with PharmD and providers to reduce symptoms of asthma  Current regimen:  o Albuterol inhaler (ProAir) as needed, Breo Ellipta 100-9mg 1 puff daily  Patient self care activities - Over the next 120 days, patient will: o Maintain asthma medication regimen  Migraine  Pharmacist Clinical Goal(s) o Over the next 120 days, patient will work with PharmD and providers to reduce symptoms of migraine  Current regimen:  o Rizatriptan 168mas needed, excedrin migraine as needed  Interventions: o Recommended patient to continue follow up with Dr. PeLeta BaptistPatient self care activities - Over the next 120 days, patient will: o Maintain migraine medication regimen  Hypothyroidism  Pharmacist Clinical Goal(s) o Over the next 120 days, patient will work with PharmD and providers to reduce symptoms of hypothyroidism  Current regimen:  o Levothyroxine 2568mdaily  Patient self care activities - Over the next 120 days, patient will: o Maintain thyroid medication regimen    Medication management  Pharmacist Clinical Goal(s): o Over the next 120 days, patient will work with PharmD and providers to maintain optimal medication adherence  Current pharmacy: [facility]  Interventions o Comprehensive medication review performed. o Continue current medication management strategy  Patient self care activities - Over the next 120 days, patient will: o Focus on medication adherence by filling and taking medications appropriately  o Take medications as  prescribed o Report any questions or concerns to PharmD and/or provider(s)  Initial goal documentation        Morgan [name] was given information about Chronic Care Management services today including:  1. CCM service includes personalized support from designated clinical staff supervised by her physician, including individualized plan of care and coordination with other care providers 2. 24/7 contact phone numbers for assistance for urgent and routine care needs. 3. Standard insurance, coinsurance, copays and deductibles apply for chronic care management only during months in which we provide at least 20 minutes of these services. Most insurances cover these services at 100%, however patients may be responsible for any copay, coinsurance and/or deductible if applicable. This service may help you avoid the need for more expensive face-to-face services. 4. Only one practitioner may furnish and bill the service in a calendar month. 5. The patient may stop CCM services at any time (effective at the end of the month) by phone call to the office staff.  Patient agreed to services and verbal consent obtained.   The patient verbalized understanding of instructions provided today and agreed to receive a mailed copy of patient instruction and/or educational materials. Telephone follow up appointment with pharmacy team member scheduled for: 05/03/2020  Morgan [name], PharmD Clinical Pharmacist [id number] Healthy Eating Following a healthy eating pattern may help you to achieve and maintain a healthy body weight, reduce the risk of chronic disease, and live a long and productive life. It is important to follow a healthy eating pattern at an appropriate calorie level for your body. Your nutritional needs should be met primarily through food  by choosing a variety of nutrient-rich foods. What are tips for following this plan? Reading food labels  Read  labels and choose the following: ? Reduced or low sodium. ? Juices with 100% fruit juice. ? Foods with low saturated fats and high polyunsaturated and monounsaturated fats. ? Foods with whole grains, such as whole wheat, cracked wheat, brown rice, and wild rice. ? Whole grains that are fortified with folic acid. This is recommended for women who are pregnant or who want to become pregnant.  Read labels and avoid the following: ? Foods with a lot of added sugars. These include foods that contain brown sugar, corn sweetener, corn syrup, dextrose, fructose, glucose, high-fructose corn syrup, honey, invert sugar, lactose, malt syrup, maltose, molasses, raw sugar, sucrose, trehalose, or turbinado sugar.  Do not eat more than the following amounts of added sugar per Morgan [name]:  6 teaspoons (25 g) for women.  9 teaspoons (38 g) for men. ? Foods that contain processed or refined starches and grains. ? Refined grain products, such as white flour, degermed cornmeal, white bread, and white rice. Shopping  Choose nutrient-rich snacks, such as vegetables, whole fruits, and nuts. Avoid high-calorie and high-sugar snacks, such as potato chips, fruit snacks, and candy.  Use oil-based dressings and spreads on foods instead of solid fats such as butter, stick margarine, or cream cheese.  Limit pre-made sauces, mixes, and "instant" products such as flavored rice, instant noodles, and ready-made pasta.  Try more plant-protein sources, such as tofu, tempeh, black beans, edamame, lentils, nuts, and seeds.  Explore eating plans such as the Mediterranean diet or vegetarian diet. Cooking  Use oil to saut or stir-fry foods instead of solid fats such as butter, stick margarine, or lard.  Try baking, boiling, grilling, or broiling instead of frying.  Remove the fatty part of meats before cooking.  Steam vegetables in water or broth. Meal planning   At meals, imagine dividing your plate into  fourths: ? One-half of your plate is fruits and vegetables. ? One-fourth of your plate is whole grains. ? One-fourth of your plate is protein, especially lean meats, poultry, eggs, tofu, beans, or nuts.  Include low-fat dairy as part of your daily diet. Lifestyle  Choose healthy options in all settings, including home, work, school, restaurants, or stores.  Prepare your food safely: ? Wash your hands after handling raw meats. ? Keep food preparation surfaces clean by regularly washing with hot, soapy water. ? Keep raw meats separate from ready-to-eat foods, such as fruits and vegetables. ? Cook seafood, meat, poultry, and eggs to the recommended internal temperature. ? Store foods at safe temperatures. In general:  Keep cold foods at 76F (4.4C) or below.  Keep hot foods at 176F (60C) or above.  Keep your freezer at [facility] (-17.8C) or below.  Foods are no longer safe to eat when they have been between the temperatures of 40-176F (4.4-60C) for more than 2 hours. What foods should I eat? Fruits Aim to eat 2 cup-equivalents of fresh, canned (in natural juice), or frozen fruits each Omarius Grantham. Examples of 1 cup-equivalent of fruit include 1 small apple, 8 large strawberries, 1 cup canned fruit,  cup dried fruit, or 1 cup 100% juice. Vegetables Aim to eat 2-3 cup-equivalents of fresh and frozen vegetables each Winry Egnew, including different varieties and colors. Examples of 1 cup-equivalent of vegetables include 2 medium carrots, 2 cups raw, leafy greens, 1 cup chopped vegetable (raw or cooked), or 1 medium baked potato. Grains Aim to eat  6 ounce-equivalents of whole grains each Hiram Mciver. Examples of 1 ounce-equivalent of grains include 1 slice of bread, 1 cup ready-to-eat cereal, 3 cups popcorn, or  cup cooked rice, pasta, or cereal. Meats and other proteins Aim to eat 5-6 ounce-equivalents of protein each Aubriee Szeto. Examples of 1 ounce-equivalent of protein include 1 egg, 1/2 cup nuts or seeds, or 1  tablespoon (16 g) peanut butter. A cut of meat or fish that is the size of a deck of cards is about 3-4 ounce-equivalents.  Of the protein you eat each week, try to have at least 8 ounces come from seafood. This includes salmon, trout, herring, and anchovies. Dairy Aim to eat 3 cup-equivalents of fat-free or low-fat dairy each Aliyanah Rozas. Examples of 1 cup-equivalent of dairy include 1 cup (240 mL) milk, 8 ounces (250 g) yogurt, 1 ounces (44 g) natural cheese, or 1 cup (240 mL) fortified soy milk. Fats and oils  Aim for about 5 teaspoons (21 g) per Muad Noga. Choose monounsaturated fats, such as canola and olive oils, avocados, peanut butter, and most nuts, or polyunsaturated fats, such as sunflower, corn, and soybean oils, walnuts, pine nuts, sesame seeds, sunflower seeds, and flaxseed. Beverages  Aim for six 8-oz glasses of water per Lise Pincus. Limit coffee to three to five 8-oz cups per Mako Pelfrey.  Limit caffeinated beverages that have added calories, such as soda and energy drinks.  Limit alcohol intake to no more than 1 drink a Shayana Hornstein for nonpregnant women and 2 drinks a Donnel Venuto for men. One drink equals 12 oz of beer (355 mL), 5 oz of wine (148 mL), or 1 oz of hard liquor (44 mL). Seasoning and other foods  Avoid adding excess amounts of salt to your foods. Try flavoring foods with herbs and spices instead of salt.  Avoid adding sugar to foods.  Try using oil-based dressings, sauces, and spreads instead of solid fats. This information is based on general [place] nutrition guidelines. For more information, visit Build[employer].es. Exact amounts may vary based on your nutrition needs. Summary  A healthy eating plan may help you to maintain a healthy weight, reduce the risk of chronic diseases, and stay active throughout your life.  Plan your meals. Make sure you eat the right portions of a variety of nutrient-rich foods.  Try baking, boiling, grilling, or broiling instead of frying.  Choose healthy options in all  settings, including home, work, school, restaurants, or stores. This information is not intended to replace advice given to you by your health care provider. Make sure you discuss any questions you have with your health care provider. Document Revised: 10/18/2017 Document Reviewed: 10/18/2017 Elsevier Patient Education  [name].

## 2020-01-15 ENCOUNTER — Other Ambulatory Visit (HOSPITAL_BASED_OUTPATIENT_CLINIC_OR_DEPARTMENT_OTHER): Payer: Self-pay | Admitting: Family Medicine

## 2020-01-15 DIAGNOSIS — Z1231 Encounter for screening mammogram for malignant neoplasm of breast: Secondary | ICD-10-CM

## 2020-01-18 ENCOUNTER — Encounter: Payer: Self-pay | Admitting: Family Medicine

## 2020-01-18 ENCOUNTER — Other Ambulatory Visit: Payer: Self-pay | Admitting: Family Medicine

## 2020-01-18 ENCOUNTER — Ambulatory Visit (HOSPITAL_BASED_OUTPATIENT_CLINIC_OR_DEPARTMENT_OTHER)
Admission: RE | Admit: 2020-01-18 | Discharge: 2020-01-18 | Disposition: A | Discharge status: Home / Self Care | Payer: Medicare Other | Source: Ambulatory Visit | Attending: Family Medicine | Admitting: Family Medicine

## 2020-01-18 ENCOUNTER — Ambulatory Visit (INDEPENDENT_AMBULATORY_CARE_PROVIDER_SITE_OTHER): Payer: Medicare Other | Admitting: Family Medicine

## 2020-01-18 ENCOUNTER — Other Ambulatory Visit: Payer: Self-pay

## 2020-01-18 VITALS — BP 108/72 | HR 76 | Temp 97.9°F | Resp 18 | Ht 63.5 in | Wt 173.0 lb

## 2020-01-18 DIAGNOSIS — R591 Generalized enlarged lymph nodes: Secondary | ICD-10-CM

## 2020-01-18 DIAGNOSIS — K116 Mucocele of salivary gland: Secondary | ICD-10-CM | POA: Diagnosis not present

## 2020-01-18 DIAGNOSIS — R9389 Abnormal findings on diagnostic imaging of other specified body structures: Secondary | ICD-10-CM

## 2020-01-18 DIAGNOSIS — R590 Localized enlarged lymph nodes: Secondary | ICD-10-CM | POA: Diagnosis not present

## 2020-01-18 DIAGNOSIS — M542 Cervicalgia: Secondary | ICD-10-CM

## 2020-01-18 DIAGNOSIS — R5383 Other fatigue: Secondary | ICD-10-CM | POA: Diagnosis not present

## 2020-01-18 NOTE — Progress Notes (Signed)
Patient ID: Morgan [name], female    DOB: 1966/03/13  Age: 54 y.o. MRN: [id number]    Subjective:  Subjective  HPI Morgan [name] presents for pain R side throat -- she has tried heat and tylenol    It started yesterday but she has had it before and it went away.    She feels a lump on the r side of her neck-- tender to to touch   She has pain with swallowing and talking   Review of Systems  Constitutional: Negative for appetite change, diaphoresis, fatigue and unexpected weight change.  Eyes: Negative for pain, redness and visual disturbance.  Respiratory: Negative for cough, chest tightness, shortness of breath and wheezing.   Cardiovascular: Negative for chest pain, palpitations and leg swelling.  Endocrine: Negative for cold intolerance, heat intolerance, polydipsia, polyphagia and polyuria.  Genitourinary: Negative for difficulty urinating, dysuria and frequency.  Neurological: Negative for dizziness, light-headedness, numbness and headaches.    History Past Medical History:  Diagnosis Date  . Anxiety   . Arthritis   . Asthma   . Bipolar disorder ([place])   . Bulging lumbar disc 07/19/05  . Chronic headaches   . Chronic lymphocytic leukemia ([place]) 07/31/2016  . Colon polyps   . Degenerative disorder of bone   . Depression   . History of borderline personality disorder   . Hypothyroidism 2007   Developed after use of Lithium  . IBS (irritable bowel syndrome) 1995  . Pneumonia   . Proctitis   . Shingles 07/2018  . Substance abuse ([place])    ativan last month    She has a past surgical history that includes Cholecystectomy; Tubal ligation; Abdominal hysterectomy; Shoulder open rotator cuff repair (Right, 03/2010); Bunionectomy (Right, 06/2012); Appendectomy; and Oophorectomy.   Her family history includes Alcohol abuse in her brother and paternal grandfather; Alzheimer's disease in her father and paternal grandmother; Arthritis in her paternal grandmother; COPD in her maternal  grandmother; Coronary artery disease in her paternal grandfather; Depression in her mother and paternal grandmother; Diabetes in her paternal grandmother; Drug abuse in her brother; Heart disease in her maternal grandfather and paternal grandfather; Hypertension in her mother; Post-traumatic stress disorder in her brother and sister; Thyroid disease in her father.She reports that she quit smoking about 39 years ago. She has never used smokeless tobacco. She reports current alcohol use. She reports previous drug use.  Current Outpatient Medications on File Prior to Visit  Medication Sig Dispense Refill  . acalabrutinib (CALQUENCE) 100 MG capsule Take 100 mg by mouth 2 (two) times daily.    Marland Kitchen acetaminophen (TYLENOL) 500 MG tablet Take 1,000 mg by mouth every 6 (six) hours as needed for mild pain.    Marland Kitchen albuterol (VENTOLIN HFA) 108 (90 Base) MCG/ACT inhaler Inhale 1-2 puffs into the lungs every 6 (six) hours as needed for wheezing or shortness of breath. 18 g 6  . diphenhydrAMINE (BENADRYL) 25 MG tablet Take 1 tablet (25 mg total) by mouth every 6 (six) hours as needed for itching. 20 tablet 0  . EPINEPHrine 0.3 mg/0.3 mL IJ SOAJ injection Inject 0.3 mLs (0.3 mg total) into the muscle as needed for anaphylaxis. 1 each prn  . fluticasone furoate-vilanterol (BREO ELLIPTA) 100-25 MCG/INH AEPB Inhale 1 puff into the lungs daily. 1 each 11  . hyoscyamine (LEVSIN) 0.125 MG tablet Take 1 tablet (0.125 mg total) by mouth as needed for cramping. Reported on 09/24/2015 10 tablet 2  . levothyroxine (SYNTHROID) 25 MCG tablet Take 1 tablet (  25 mcg total) by mouth daily before breakfast. 30 tablet 2  . ondansetron (ZOFRAN) 8 MG tablet Take by mouth every 8 (eight) hours as needed for nausea or vomiting.    . rizatriptan (MAXALT-MLT) 10 MG disintegrating tablet Take 1 tablet (10 mg total) by mouth as needed for migraine. May repeat in 2 hours if needed 9 tablet 11  . UNABLE TO FIND Take by mouth. ELDERBERRY FRUIT ORAL      . UNABLE TO FIND Med Name: Lithium 4.8 mg one daily    . valACYclovir (VALTREX) 1000 MG tablet Take 1 tablet (1,000 mg total) by mouth daily. (Patient taking differently: Take 500 mg by mouth 2 (two) times daily. Has 500mg  tabs for prophylatic dosing) 90 tablet 3   No current facility-administered medications on file prior to visit.     Objective:  Objective  Physical Exam BP 108/72 (BP Location: Right Arm, Patient Position: Sitting, Cuff Size: Normal)   Pulse 76   Temp 97.9 F (36.6 C) (Temporal)   Resp 18   Ht 5' 3.5" (1.613 m)   Wt 173 lb (78.5 kg)   SpO2 99%   BMI 30.16 kg/m  Wt Readings from Last 3 Encounters:  01/18/20 173 lb (78.5 kg)  01/04/20 172 lb (78 kg)  12/07/19 167 lb (75.8 kg)     Lab Results  Component Value Date   WBC 78.6 Repeated and verified X2. (HH) 09/12/2019   HGB 12.5 09/12/2019   HCT 39.8 09/12/2019   PLT 156.0 09/12/2019   GLUCOSE 85 09/12/2019   CHOL 241 (H) 04/27/2018   TRIG 171.0 (H) 04/27/2018   HDL 104.30 04/27/2018   LDLCALC 103 (H) 04/27/2018   ALT 23 09/12/2019   AST 25 09/12/2019   NA 138 09/12/2019   K 4.2 09/12/2019   CL 102 09/12/2019   CREATININE 0.96 09/12/2019   BUN 16 09/12/2019   CO2 30 09/12/2019   TSH 2.24 12/07/2019   INR 0.88 10/07/2017   HGBA1C 5.5 02/16/2019    DG Chest Port 1 View  Result Date: 06/04/2019 CLINICAL DATA:  Chest pain EXAM: PORTABLE CHEST 1 VIEW COMPARISON:  02/10/2019 FINDINGS: The heart size and mediastinal contours are within normal limits. Both lungs are clear. The visualized skeletal structures are unremarkable. IMPRESSION: No acute abnormality of the lungs in AP portable projection. Electronically Signed   By: Eddie Candle M.D.   On: 06/04/2019 19:29     Assessment & Plan:  Plan  I am having Morgan [name] maintain her acetaminophen, UNABLE TO FIND, diphenhydrAMINE, UNABLE TO FIND, valACYclovir, Calquence, ondansetron, fluticasone furoate-vilanterol, albuterol, levothyroxine,  hyoscyamine, EPINEPHrine, and rizatriptan.  No orders of the defined types were placed in this encounter.   Problem List Items Addressed This Visit    None    Visit Diagnoses    Neck pain    -  Primary   Relevant Orders   US SOFT TISSUE HEAD & NECK (NON-THYROID)   CBC with Differential/Platelet   Comprehensive metabolic panel   Lymphadenopathy       Relevant Orders   CBC with Differential/Platelet   Comprehensive metabolic panel   Other fatigue       Relevant Orders   CBC with Differential/Platelet   Comprehensive metabolic panel   Thyroid Panel With TSH   Vitamin B12   Vitamin D (25 hydroxy)    if wbc increased or US shows lymph nodes -- f/u oncologist Consider muscle relaxer but she wants to wait  F/u pcp Follow-up:  Return if symptoms worsen or fail to improve.  Ann Held, DO

## 2020-01-19 ENCOUNTER — Encounter: Payer: Self-pay | Admitting: General Practice

## 2020-01-19 ENCOUNTER — Telehealth: Payer: Self-pay | Admitting: Family Medicine

## 2020-01-19 ENCOUNTER — Ambulatory Visit: Payer: Medicare Other | Admitting: Family Medicine

## 2020-01-19 LAB — CBC WITH DIFFERENTIAL/PLATELET
Basophils Absolute: 0.1 10*3/uL (ref 0.0–0.1)
Basophils Relative: 1 % (ref 0.0–3.0)
Eosinophils Absolute: 0.2 10*3/uL (ref 0.0–0.7)
Eosinophils Relative: 2.6 % (ref 0.0–5.0)
HCT: 41.5 % (ref 36.0–46.0)
Hemoglobin: 13.5 g/dL (ref 12.0–15.0)
Lymphocytes Relative: 53.1 % — ABNORMAL HIGH (ref 12.0–46.0)
Lymphs Abs: 5.1 10*3/uL — ABNORMAL HIGH (ref 0.7–4.0)
MCHC: 32.5 g/dL (ref 30.0–36.0)
MCV: 87.9 fl (ref 78.0–100.0)
Monocytes Absolute: 0.5 10*3/uL (ref 0.1–1.0)
Monocytes Relative: 5.7 % (ref 3.0–12.0)
Neutro Abs: 3.6 10*3/uL (ref 1.4–7.7)
Neutrophils Relative %: 37.6 % — ABNORMAL LOW (ref 43.0–77.0)
Platelets: 235 10*3/uL (ref 150.0–400.0)
RBC: 4.73 Mil/uL (ref 3.87–5.11)
RDW: 15.3 % (ref 11.5–15.5)
WBC: 9.6 10*3/uL (ref 4.0–10.5)

## 2020-01-19 LAB — COMPREHENSIVE METABOLIC PANEL
ALT: 7 U/L (ref 0–35)
AST: 13 U/L (ref 0–37)
Albumin: 4.6 g/dL (ref 3.5–5.2)
Alkaline Phosphatase: 54 U/L (ref 39–117)
BUN: 14 mg/dL (ref 6–23)
CO2: 29 mEq/L (ref 19–32)
Calcium: 10 mg/dL (ref 8.4–10.5)
Chloride: 105 mEq/L (ref 96–112)
Creatinine, Ser: 0.73 mg/dL (ref 0.40–1.20)
GFR: 82.75 mL/min (ref >60.00)
Glucose, Bld: 78 mg/dL (ref 70–99)
Potassium: 4.3 mEq/L (ref 3.5–5.1)
Sodium: 141 mEq/L (ref 135–145)
Total Bilirubin: 0.4 mg/dL (ref 0.2–1.2)
Total Protein: 6.5 g/dL (ref 6.0–8.3)

## 2020-01-19 LAB — VITAMIN D 25 HYDROXY (VIT D DEFICIENCY, FRACTURES): VITD: 32.34 ng/mL (ref 30.00–100.00)

## 2020-01-19 LAB — THYROID PANEL WITH TSH
Free Thyroxine Index: 2.1 (ref 1.4–3.8)
T3 Uptake: 30 % (ref 22–35)
T4, Total: 7.1 ug/dL (ref 5.1–11.9)
TSH: 1.89 mIU/L

## 2020-01-19 LAB — VITAMIN B12: Vitamin B-12: 407 pg/mL (ref 211–911)

## 2020-01-19 NOTE — Progress Notes (Signed)
[place] Spiritual Care Note  Received call from [facility] for help with processing, coping with, and reframing distress regarding a painful lump in her neck that she is having examined next via CT. Getting to speak her concerns aloud and to hear herself make a plan for self-care through the weekend appeared to help decrease her distress. We were also able to laugh by the end of the call, which was a further release. Maddock ready and will call again when needed.   [place], [place], [facility] Pager [phone] Voicemail [phone]

## 2020-01-19 NOTE — Telephone Encounter (Signed)
Caller: Collen Call back phone number: [phone]  Patient states she has seen the ultrasound results, however, she has questions. Patient would like a call back today.   Please advise

## 2020-01-19 NOTE — Telephone Encounter (Signed)
Radiologist could not be sure what he saw--- may be related to her treatment-- they recommended a ct to check for sure Order was placed---- I'll be gone next week make sure pcp gets results

## 2020-01-19 NOTE — Telephone Encounter (Signed)
Patient Morgan [name] has been scheduled for CT 7/6. I will forward [place] report to oncologist Dr. Lanette Hampshire as well.

## 2020-01-19 NOTE — Telephone Encounter (Signed)
Can you explain what I need to tell patient?

## 2020-01-19 NOTE — Telephone Encounter (Signed)
Thanks Kendrick Fries- I was in the office yesterday, sorry that Lynnsey did not get put on my schedule for whatever reason ?  Thank you for seeing her

## 2020-01-23 ENCOUNTER — Encounter (HOSPITAL_BASED_OUTPATIENT_CLINIC_OR_DEPARTMENT_OTHER): Payer: Self-pay

## 2020-01-23 ENCOUNTER — Other Ambulatory Visit: Payer: Self-pay

## 2020-01-23 ENCOUNTER — Ambulatory Visit (HOSPITAL_BASED_OUTPATIENT_CLINIC_OR_DEPARTMENT_OTHER)
Admission: RE | Admit: 2020-01-23 | Discharge: 2020-01-23 | Disposition: A | Discharge status: Home / Self Care | Payer: Medicare Other | Source: Ambulatory Visit | Attending: Family Medicine | Admitting: Family Medicine

## 2020-01-23 ENCOUNTER — Encounter: Payer: Self-pay | Admitting: General Practice

## 2020-01-23 DIAGNOSIS — M50322 Other cervical disc degeneration at C5-C6 level: Secondary | ICD-10-CM | POA: Diagnosis not present

## 2020-01-23 DIAGNOSIS — Z1231 Encounter for screening mammogram for malignant neoplasm of breast: Secondary | ICD-10-CM | POA: Diagnosis not present

## 2020-01-23 DIAGNOSIS — R9389 Abnormal findings on diagnostic imaging of other specified body structures: Secondary | ICD-10-CM | POA: Insufficient documentation

## 2020-01-23 DIAGNOSIS — R590 Localized enlarged lymph nodes: Secondary | ICD-10-CM | POA: Diagnosis not present

## 2020-01-23 DIAGNOSIS — M50323 Other cervical disc degeneration at C6-C7 level: Secondary | ICD-10-CM | POA: Diagnosis not present

## 2020-01-23 MED ORDER — IOHEXOL 300 MG/ML  SOLN
100.0000 mL | Freq: Once | INTRAMUSCULAR | Status: AC | PRN
  Administered 2020-01-23: 75 mL via INTRAVENOUS

## 2020-01-23 NOTE — Progress Notes (Signed)
[place] Spiritual Care Note  Received call from [facility] for spiritual and emotional support as she processed her fatigue, pain, and difficulty sleeping, as well as the preliminary info she has received about the suspected cyst in her neck. Provided empathic listening, pastoral reflection, affirmation of strengths, and prayer per request.   Suzette Battiest, [place], [facility] Pager [phone] Voicemail [phone]

## 2020-01-24 ENCOUNTER — Telehealth: Payer: Self-pay

## 2020-01-24 DIAGNOSIS — R2232 Localized swelling, mass and lump, left upper limb: Secondary | ICD-10-CM

## 2020-01-24 DIAGNOSIS — R1310 Dysphagia, unspecified: Secondary | ICD-10-CM

## 2020-01-24 NOTE — Telephone Encounter (Signed)
We will communicate with patient via MyChart for recent mammogram and CT results

## 2020-01-24 NOTE — Telephone Encounter (Signed)
Please advise of CT-will call patient with further insructions. [place] ordered however she is out of office.

## 2020-01-24 NOTE — Telephone Encounter (Signed)
Pt just got results from a CT regarding a cyst and would like Dr. Lorelei Pont to call her.  CB [phone].

## 2020-01-25 ENCOUNTER — Other Ambulatory Visit: Payer: Self-pay | Admitting: Family Medicine

## 2020-01-25 DIAGNOSIS — R928 Other abnormal and inconclusive findings on diagnostic imaging of breast: Secondary | ICD-10-CM

## 2020-01-25 NOTE — Telephone Encounter (Signed)
Called pt back so we could talk about her concerns She is concerned about the thyroglossal duct cyst; we discussed this, and the fact that this congenital finding likely is not the cause of her speaking/ swallowing symptoms  She has noted some pain and difficulty if she talks for a long time, and swallowing can be painful  She would like to have an ENT consultation which I will start for her Will fax copy of recent neck CT to her main oncologist Dr Lanette Hampshire at [facility] has an appt next week for follow-up  She also needs an order for left axillary [place] - placed now

## 2020-01-25 NOTE — Telephone Encounter (Signed)
Pt called and stated the cyst is affecting her although it is a genetic thing.  Pt is having a hard time talking and swallowing.  Although pt has had it, it has gotten worse.  Lymphnodes are better but this issue is more prominent now and has been around.  The main focus has been on the Leukemia.  Pt is looking for answers for best ways to manage this now.  Advice on moving forward.  Pt is asking for a call back from provider.

## 2020-01-25 NOTE — Addendum Note (Signed)
Addended by: Lamar Blinks C on: 01/25/2020 02:24 PM   Modules accepted: Orders

## 2020-01-26 ENCOUNTER — Ambulatory Visit (HOSPITAL_BASED_OUTPATIENT_CLINIC_OR_DEPARTMENT_OTHER): Payer: Medicare Other

## 2020-01-29 DIAGNOSIS — C9110 Chronic lymphocytic leukemia of B-cell type not having achieved remission: Secondary | ICD-10-CM | POA: Diagnosis not present

## 2020-01-29 DIAGNOSIS — Z79899 Other long term (current) drug therapy: Secondary | ICD-10-CM | POA: Diagnosis not present

## 2020-01-29 DIAGNOSIS — K589 Irritable bowel syndrome without diarrhea: Secondary | ICD-10-CM | POA: Diagnosis not present

## 2020-01-29 DIAGNOSIS — Z87891 Personal history of nicotine dependence: Secondary | ICD-10-CM | POA: Diagnosis not present

## 2020-01-29 DIAGNOSIS — D801 Nonfamilial hypogammaglobulinemia: Secondary | ICD-10-CM | POA: Diagnosis not present

## 2020-01-29 DIAGNOSIS — E039 Hypothyroidism, unspecified: Secondary | ICD-10-CM | POA: Diagnosis not present

## 2020-01-29 DIAGNOSIS — G629 Polyneuropathy, unspecified: Secondary | ICD-10-CM | POA: Diagnosis not present

## 2020-01-29 DIAGNOSIS — I493 Ventricular premature depolarization: Secondary | ICD-10-CM | POA: Diagnosis not present

## 2020-01-29 DIAGNOSIS — J45909 Unspecified asthma, uncomplicated: Secondary | ICD-10-CM | POA: Diagnosis not present

## 2020-01-30 ENCOUNTER — Ambulatory Visit
Admission: RE | Admit: 2020-01-30 | Discharge: 2020-01-30 | Disposition: A | Discharge status: Home / Self Care | Payer: Medicare Other | Source: Ambulatory Visit | Attending: Family Medicine | Admitting: Family Medicine

## 2020-01-30 ENCOUNTER — Other Ambulatory Visit: Payer: Self-pay

## 2020-01-30 ENCOUNTER — Other Ambulatory Visit: Payer: Self-pay | Admitting: Family Medicine

## 2020-01-30 DIAGNOSIS — N6489 Other specified disorders of breast: Secondary | ICD-10-CM | POA: Diagnosis not present

## 2020-01-30 DIAGNOSIS — R928 Other abnormal and inconclusive findings on diagnostic imaging of breast: Secondary | ICD-10-CM

## 2020-01-30 DIAGNOSIS — C9110 Chronic lymphocytic leukemia of B-cell type not having achieved remission: Secondary | ICD-10-CM | POA: Diagnosis not present

## 2020-02-13 NOTE — Telephone Encounter (Signed)
Error

## 2020-02-15 ENCOUNTER — Emergency Department (HOSPITAL_BASED_OUTPATIENT_CLINIC_OR_DEPARTMENT_OTHER): Payer: Medicare Other

## 2020-02-15 ENCOUNTER — Emergency Department (HOSPITAL_BASED_OUTPATIENT_CLINIC_OR_DEPARTMENT_OTHER)
Admission: EM | Admit: 2020-02-15 | Discharge: 2020-02-15 | Disposition: A | Discharge status: Home / Self Care | Payer: Medicare Other | Attending: Emergency Medicine | Admitting: Emergency Medicine

## 2020-02-15 ENCOUNTER — Other Ambulatory Visit: Payer: Self-pay

## 2020-02-15 ENCOUNTER — Encounter (HOSPITAL_BASED_OUTPATIENT_CLINIC_OR_DEPARTMENT_OTHER): Payer: Self-pay

## 2020-02-15 DIAGNOSIS — Z20822 Contact with and (suspected) exposure to covid-19: Secondary | ICD-10-CM | POA: Insufficient documentation

## 2020-02-15 DIAGNOSIS — Z7951 Long term (current) use of inhaled steroids: Secondary | ICD-10-CM | POA: Insufficient documentation

## 2020-02-15 DIAGNOSIS — R0602 Shortness of breath: Secondary | ICD-10-CM | POA: Diagnosis not present

## 2020-02-15 DIAGNOSIS — Z91010 Allergy to peanuts: Secondary | ICD-10-CM | POA: Insufficient documentation

## 2020-02-15 DIAGNOSIS — J069 Acute upper respiratory infection, unspecified: Secondary | ICD-10-CM | POA: Diagnosis not present

## 2020-02-15 DIAGNOSIS — R0789 Other chest pain: Secondary | ICD-10-CM | POA: Diagnosis not present

## 2020-02-15 DIAGNOSIS — Z91040 Latex allergy status: Secondary | ICD-10-CM | POA: Insufficient documentation

## 2020-02-15 DIAGNOSIS — E039 Hypothyroidism, unspecified: Secondary | ICD-10-CM | POA: Insufficient documentation

## 2020-02-15 DIAGNOSIS — B349 Viral infection, unspecified: Secondary | ICD-10-CM | POA: Diagnosis not present

## 2020-02-15 DIAGNOSIS — Z87891 Personal history of nicotine dependence: Secondary | ICD-10-CM | POA: Diagnosis not present

## 2020-02-15 DIAGNOSIS — J45909 Unspecified asthma, uncomplicated: Secondary | ICD-10-CM | POA: Diagnosis not present

## 2020-02-15 LAB — CBC
HCT: 44.2 % (ref 36.0–46.0)
Hemoglobin: 13.9 g/dL (ref 12.0–15.0)
MCH: 27.4 pg (ref 26.0–34.0)
MCHC: 31.4 g/dL (ref 30.0–36.0)
MCV: 87.2 fL (ref 80.0–100.0)
Platelets: 256 10*3/uL (ref 150–400)
RBC: 5.07 MIL/uL (ref 3.87–5.11)
RDW: 13 % (ref 11.5–15.5)
WBC: 6.5 10*3/uL (ref 4.0–10.5)
nRBC: 0 % (ref 0.0–0.2)

## 2020-02-15 LAB — SARS CORONAVIRUS 2 BY RT PCR (HOSPITAL ORDER, PERFORMED IN ~~LOC~~ HOSPITAL LAB): SARS Coronavirus 2: NEGATIVE

## 2020-02-15 LAB — BASIC METABOLIC PANEL
Anion gap: 11 (ref 5–15)
BUN: 15 mg/dL (ref 6–20)
CO2: 25 mmol/L (ref 22–32)
Calcium: 9.5 mg/dL (ref 8.9–10.3)
Chloride: 104 mmol/L (ref 98–111)
Creatinine, Ser: 0.81 mg/dL (ref 0.44–1.00)
GFR calc Af Amer: >60 mL/min (ref >60)
GFR calc non Af Amer: >60 mL/min (ref >60)
Glucose, Bld: 97 mg/dL (ref 70–99)
Potassium: 4.1 mmol/L (ref 3.5–5.1)
Sodium: 140 mmol/L (ref 135–145)

## 2020-02-15 LAB — TROPONIN I (HIGH SENSITIVITY)
Troponin I (High Sensitivity): <2 ng/L (ref <18)
Troponin I (High Sensitivity): <2 ng/L (ref <18)

## 2020-02-15 MED ORDER — SODIUM CHLORIDE 0.9% FLUSH
3.0000 mL | Freq: Once | INTRAVENOUS | Status: DC
  Filled 2020-02-15: qty 3

## 2020-02-15 MED ORDER — IOHEXOL 350 MG/ML SOLN
100.0000 mL | Freq: Once | INTRAVENOUS | Status: AC
  Administered 2020-02-15: 100 mL via INTRAVENOUS

## 2020-02-15 NOTE — ED Notes (Signed)
Attempted x 2 to obtain IV/blood; unsuccessful.

## 2020-02-15 NOTE — ED Provider Notes (Signed)
[facility] EMERGENCY DEPARTMENT Provider Note   CSN: [id number] Arrival date & time: 02/15/20  1346     History Chief Complaint  Patient presents with  . Chest Pain    Morgan [name] is a 54 y.o. female with a past medical history of CLL currently on oral chemotherapy, IVIG infusions, bipolar disorder, IBS, presenting to the ED with a chief complaint of chest tightness, shortness of breath, generalized body aches, fatigue and sore throat.  For the past 5 days has been having intermittent body aches and shortness of breath.  Has been using her inhaler with some improvement in her symptoms.  Also reports rhinorrhea.  Today started coughing up mucus.  Denies any fevers but does report chills.  Has been taking Tylenol with last dose being about 2 days ago.  Denies any chest pain but did have an episode yesterday where she had chest tightness that resolved after several minutes.  States that the body aches are sometimes in her joints and muscles.  Has been vaccinated against Covid, no known Covid exposures.  Denies any vomiting, abdominal pain, trismus, drooling, neck stiffness or recent travel.  Was advised by PCP to come to the ER for Covid test and treatment.  HPI     Past Medical History:  Diagnosis Date  . Anxiety   . Arthritis   . Asthma   . Bipolar disorder ([place])   . Bulging lumbar disc 07/19/05  . Chronic headaches   . Chronic lymphocytic leukemia ([place]) 07/31/2016  . Colon polyps   . Degenerative disorder of bone   . Depression   . History of borderline personality disorder   . Hypothyroidism 2007   Developed after use of Lithium  . IBS (irritable bowel syndrome) 1995  . Pneumonia   . Proctitis   . Shingles 07/2018  . Substance abuse ([place])    ativan last month    Patient Active Problem List   Diagnosis Date Noted  . Fever 06/18/2018  . Piriformis syndrome of right side 04/22/2017  . Lateral epicondylitis of left elbow 04/06/2017  . Solitary pulmonary  nodule 10/29/2016  . Chronic lymphocytic leukemia ([place]) 07/31/2016  . Mass in neck 04/17/2016  . Abdominal mass 04/17/2016  . Asthma 12/12/2015  . Multiple environmental allergies 12/12/2015  . Chronic lower back pain 09/12/2015  . Bipolar 1 disorder, depressed ([place]) 08/27/2015    Class: Chronic  . Other specified hypothyroidism 08/23/2015  . Borderline personality disorder ([place]) 08/15/2015  . Severe benzodiazepine use disorder ([place]) 08/15/2015  . Alcohol use disorder, moderate, dependence ([place]) 08/15/2015  . PTSD (post-traumatic stress disorder) 08/15/2015  . History of bipolar disorder 08/15/2015  . Pre-syncope 05/17/2015  . Skull deformity 05/17/2015  . Patellofemoral syndrome of both knees 05/02/2015    Past Surgical History:  Procedure Laterality Date  . ABDOMINAL HYSTERECTOMY    . APPENDECTOMY    . BUNIONECTOMY Right 06/2012  . CHOLECYSTECTOMY    . OOPHORECTOMY    . SHOULDER OPEN ROTATOR CUFF REPAIR Right 03/2010  . TUBAL LIGATION       OB History    Gravida  2   Para  2   Term  2   Preterm  0   AB  0   Living  2     SAB  0   TAB  0   Ectopic  0   Multiple  0   Live Births              Family  History  Problem Relation Age of Onset  . Depression Mother   . Hypertension Mother   . Post-traumatic stress disorder Sister   . Alcohol abuse Brother   . Drug abuse Brother   . Post-traumatic stress disorder Brother   . Thyroid disease Father   . Alzheimer's disease Father   . COPD Maternal Grandmother   . Heart disease Maternal Grandfather   . Arthritis Paternal Grandmother   . Diabetes Paternal Grandmother   . Depression Paternal Grandmother   . Alzheimer's disease Paternal Grandmother   . Heart disease Paternal Grandfather   . Coronary artery disease Paternal Grandfather   . Alcohol abuse Paternal Grandfather   . Colon cancer Neg Hx   . Breast cancer Neg Hx     Social History   Tobacco Use  . Smoking status: Former Smoker    Quit  date: 07/20/1980    Years since quitting: 39.6  . Smokeless tobacco: Never Used  Vaping Use  . Vaping Use: Never used  Substance Use Topics  . Alcohol use: Yes    Alcohol/week: 0.0 standard drinks    Comment: occ  . Drug use: Not Currently    Home Medications Prior to Admission medications   Medication Sig Start Date End Date Taking? Authorizing Provider  acalabrutinib (CALQUENCE) 100 MG capsule Take 100 mg by mouth every 12 (twelve) hours.    Yes [provider]  acetaminophen (TYLENOL) 500 MG tablet Take 1,000 mg by mouth every 6 (six) hours as needed for mild pain.   Yes [provider]  albuterol (VENTOLIN HFA) 108 (90 Base) MCG/ACT inhaler Inhale 1-2 puffs into the lungs every 6 (six) hours as needed for wheezing or shortness of breath. 01/04/20  Yes Copland, Gay Filler, MD  aspirin-acetaminophen-caffeine (EXCEDRIN EXTRA STRENGTH) [phone] MG tablet Take 2 tablets by mouth every 6 (six) hours as needed for headache.   Yes [provider]  diphenhydrAMINE (BENADRYL) 25 MG tablet Take 1 tablet (25 mg total) by mouth every 6 (six) hours as needed for itching. 02/27/19  Yes Horton, Barbette Hair, MD  EPINEPHrine 0.3 mg/0.3 mL IJ SOAJ injection Inject 0.3 mLs (0.3 mg total) into the muscle as needed for anaphylaxis. 01/04/20  Yes Copland, Gay Filler, MD  fluticasone furoate-vilanterol (BREO ELLIPTA) 100-25 MCG/INH AEPB Inhale 1 puff into the lungs daily. 01/04/20  Yes Copland, Gay Filler, MD  hyoscyamine (LEVSIN) 0.125 MG tablet Take 1 tablet (0.125 mg total) by mouth as needed for cramping. Reported on 09/24/2015 01/04/20  Yes Copland, Gay Filler, MD  levothyroxine (SYNTHROID) 25 MCG tablet Take 1 tablet (25 mcg total) by mouth daily before breakfast. Patient taking differently: Take 25 mcg by mouth at bedtime.  01/04/20  Yes Copland, Gay Filler, MD  ondansetron (ZOFRAN) 8 MG tablet Take by mouth every 8 (eight) hours as needed for nausea or vomiting.   Yes [provider]  rizatriptan (MAXALT-MLT) 10 MG disintegrating tablet Take 1 tablet (10 mg total) by mouth as needed for migraine. May repeat in 2 hours if needed 01/04/20  Yes Copland, Gay Filler, MD  UNABLE TO FIND Take 2.4 mg by mouth daily. Med Name: Lithium OTC   Yes [provider]  valACYclovir (VALTREX) 500 MG tablet Take 500 mg by mouth 2 (two) times daily. 01/19/20  Yes [provider]  UNABLE TO FIND Take 10 mLs by mouth daily as needed (immune system). Homemade elderberry syrup    [provider]    Allergies  Aripiprazole, Ciprofloxacin, Lamotrigine, Nitrofurantoin, Other, Peanut oil, Amoxicillin, Doxycycline, Latex, Meloxicam, Naproxen, Pramipexole, Prednisone, Risperidone, Sulfa antibiotics, Tape, Cortisone, Percocet [oxycodone-acetaminophen], Shingrix [zoster vac recomb adjuvanted], and Vancomycin  Review of Systems   Review of Systems  Constitutional: Positive for appetite change and chills. Negative for fever.  HENT: Positive for sore throat. Negative for ear pain, rhinorrhea and sneezing.   Eyes: Negative for photophobia and visual disturbance.  Respiratory: Positive for cough and chest tightness. Negative for shortness of breath and wheezing.   Cardiovascular: Negative for chest pain and palpitations.  Gastrointestinal: Negative for abdominal pain, blood in stool, constipation, diarrhea, nausea and vomiting.  Genitourinary: Negative for dysuria, hematuria and urgency.  Musculoskeletal: Positive for myalgias.  Skin: Negative for rash.  Neurological: Negative for dizziness, weakness and light-headedness.    Physical Exam Updated Vital Signs BP 107/68   Pulse 78   Temp 98.5 F (36.9 C) (Oral)   Resp 19   Ht 5\' 3"  (1.6 m)   Wt 81.5 kg   SpO2 100%   BMI 31.83 kg/m   Physical Exam Vitals and nursing note reviewed.  Constitutional:      General: She is not in acute distress.    Appearance: She is well-developed.  HENT:     Head: Normocephalic and  atraumatic.     Nose: Nose normal.     Mouth/Throat:     Pharynx: Uvula midline. Posterior oropharyngeal erythema present. No oropharyngeal exudate.     Tonsils: No tonsillar abscesses.     Comments: Patient does not appear to be in acute distress. No trismus or drooling present. No pooling of secretions. Patient is tolerating secretions and is not in respiratory distress. No neck pain or tenderness to palpation of the neck. Full active and passive range of motion of the neck. No evidence of RPA or PTA. Eyes:     General: No scleral icterus.       Left eye: No discharge.     Conjunctiva/sclera: Conjunctivae normal.  Cardiovascular:     Rate and Rhythm: Normal rate and regular rhythm.     Heart sounds: Normal heart sounds. No murmur heard.  No friction rub. No gallop.   Pulmonary:     Effort: Pulmonary effort is normal. No respiratory distress.     Breath sounds: Normal breath sounds.  Abdominal:     General: Bowel sounds are normal. There is no distension.     Palpations: Abdomen is soft.     Tenderness: There is no abdominal tenderness. There is no guarding.     Comments: Abdomen is soft, nontender nondistended.  Musculoskeletal:        General: Normal range of motion.     Cervical back: Normal range of motion and neck supple.  Skin:    General: Skin is warm and dry.     Findings: No rash.  Neurological:     Mental Status: She is alert.     Motor: No abnormal muscle tone.     Coordination: Coordination normal.     ED Results / Procedures / Treatments   Labs (all labs ordered are listed, but only abnormal results are displayed) Labs Reviewed  SARS CORONAVIRUS 2 BY RT PCR (HOSPITAL ORDER, [facility])  BASIC METABOLIC PANEL  CBC  TROPONIN I (HIGH SENSITIVITY)  TROPONIN I (HIGH SENSITIVITY)    EKG EKG Interpretation  Date/Time:  Thursday February 15 2020 13:59:11 EDT Ventricular Rate:  88 PR Interval:  136 QRS Duration: 80 QT  Interval:  348 QTC Calculation: 421 R Axis:   65 Text Interpretation: Normal sinus rhythm Low voltage QRS Borderline ECG Confirmed by Lennice Sites [phone]) on 02/15/2020 5:13:10 PM   Radiology CT Angio Chest PE W/Cm &/Or Wo Cm  Result Date: 02/15/2020 CLINICAL DATA:  54 year old female with shortness of breath and body aches. History of chronic lymphocytic leukemia. EXAM: CT ANGIOGRAPHY CHEST WITH CONTRAST TECHNIQUE: Multidetector CT imaging of the chest was performed using the standard protocol during bolus administration of intravenous contrast. Multiplanar CT image reconstructions and MIPs were obtained to evaluate the vascular anatomy. CONTRAST:  159mL OMNIPAQUE IOHEXOL 350 MG/ML SOLN COMPARISON:  CT Chest, Abdomen, and Pelvis 06/11/2018. FINDINGS: Cardiovascular: Good contrast bolus timing in the pulmonary arterial tree. No focal filling defect identified in the pulmonary arteries to suggest acute pulmonary embolism. No cardiomegaly or pericardial effusion. There is a left chest wall cardiac event recorder which is new since 2019. Mediastinum/Nodes: Negative.  No mediastinal lymphadenopathy. Bilateral axillary lymph nodes are chronically increased in number (series 4, image 23) but appear stable since 2019. Lungs/Pleura: Lower lung volumes with diffuse crowding of lung markings. Major airways are patent. No pleural effusion or confluent pulmonary opacity. Upper Abdomen: Stable visible upper abdominal viscera, with multiple small mildly lobulated benign a patent cysts. Spleen size appears stable and within normal limits. Chronically absent gallbladder. Musculoskeletal: Some degenerative changes in the spine. No acute or suspicious osseous lesion. Review of the MIP images confirms the above findings. IMPRESSION: 1. Negative for acute pulmonary embolus. 2. Lower lung volumes with mild atelectasis. 3. Low level lymphadenopathy in the bilateral axilla appears stable since 2019, likely related to CLL.  Electronically Signed   By: Genevie Ann M.D.   On: 02/15/2020 18:23   DG Chest Portable 1 View  Result Date: 02/15/2020 CLINICAL DATA:  Chest tightness EXAM: PORTABLE CHEST 1 VIEW COMPARISON:  June 04, 2019 FINDINGS: The cardiomediastinal silhouette is unchanged in contour.Cardiac loop recorder. No pleural effusion. No pneumothorax. No acute pleuroparenchymal abnormality. Visualized abdomen is unremarkable. Multilevel degenerative changes of the thoracic spine. IMPRESSION: No acute cardiopulmonary abnormality. Electronically Signed   By: Valentino Saxon MD   On: 02/15/2020 14:24    Procedures Procedures (including critical care time)  Medications Ordered in ED Medications  sodium chloride flush (NS) 0.9 % injection 3 mL (3 mLs Intravenous Not Given 02/15/20 1706)  iohexol (OMNIPAQUE) 350 MG/ML injection 100 mL (100 mLs Intravenous Contrast Given 02/15/20 1742)    ED Course  I have reviewed the triage vital signs and the nursing notes.  Pertinent labs & imaging results that were available during my care of the patient were reviewed by me and considered in my medical decision making (see chart for details).    MDM Rules/Calculators/A&P                          54 year old female with a past medical history of CLL currently on oral chemo, IBS presenting to the ED with a chief complaint of chest tightness yesterday x1 episode, shortness of breath, generalized body aches, fatigue and sore throat for the past 5 days. Was sent to the ER by PCP to obtain a Covid test. She has been taking Tylenol with last dose being 2 days ago. Has been using her albuterol inhaler as well. No known Covid exposures. She has been vaccinated against Covid in the past. On exam patient is overall well-appearing. No lower extremity edema, erythema or calf tenderness  that concern me for DVT. She is not tachycardic, tachypneic or hypoxic. Lungs are clear to auscultation bilaterally. EKG here shows normal sinus rhythm.  Chest x-ray is unremarkable. BMP, CBC and troponin is negative x1. Covid test here is negative. CT angio of the chest was done to rule out PE, she is at risk due to her ongoing chemotherapy and her age. This was negative for PE or other acute abnormality. Suspect that her symptoms are viral in nature. Doubt ACS as she is chest pain-free all of today and her EKG and troponin are unremarkable. She is comfortable with continuing her home medications and following up with her PCP.    Patient is hemodynamically stable, in NAD, and able to ambulate in the ED. Evaluation does not show pathology that would require ongoing emergent intervention or inpatient treatment. I explained the diagnosis to the patient. Pain has been managed and has no complaints prior to discharge. Patient is comfortable with above plan and is stable for discharge at this time. All questions were answered prior to disposition. Strict return precautions for returning to the ED were discussed. Encouraged follow up with PCP.   An After Visit Summary was printed and given to the patient.   Portions of this note were generated with [employer]. Dictation errors may occur despite best attempts at proofreading.  Final Clinical Impression(s) / ED Diagnoses Final diagnoses:  Viral URI    Rx / DC Orders ED Discharge Orders    None       Delia Heady, PA-C 02/15/20 1901    Lennice Sites, DO 02/15/20 2138

## 2020-02-15 NOTE — Discharge Instructions (Addendum)
Your Covid test today was negative. Your CT scan did not show any evidence of a blood clot in your lungs. Follow-up with your primary care provider and continue taking your home medications and inhaler as needed. Return to the ER for worsening chest pain, shortness of breath, leg swelling, uncontrollable vomiting or fever.

## 2020-02-15 NOTE — ED Triage Notes (Addendum)
Pt c/o chest tightness, SOB intermittent, body aches x 5 days-states she was advised by PCP office to come to ED for tx and covid test-NAD-to triage in w/c

## 2020-02-16 ENCOUNTER — Telehealth: Payer: Medicare Other | Admitting: Family Medicine

## 2020-02-23 ENCOUNTER — Encounter: Payer: Self-pay | Admitting: General Practice

## 2020-02-23 NOTE — Progress Notes (Signed)
[place] [place] Spiritual Care Note  Returned Morgan [name]'s call, providing spiritual and emotional support, particularly pastoral reflection and affirmation of strengths. She is doing deep spiritual work in general and specifically through writing to process old issues and to cope with current health concerns within her framework of faith.   [place], [place], [facility] Pager [phone] Voicemail [phone]

## 2020-02-26 ENCOUNTER — Encounter: Payer: Self-pay | Admitting: General Practice

## 2020-02-26 DIAGNOSIS — C9110 Chronic lymphocytic leukemia of B-cell type not having achieved remission: Secondary | ICD-10-CM | POA: Diagnosis not present

## 2020-02-26 NOTE — Progress Notes (Signed)
[facility] Spiritual Care Note  Davette called to share the hard, though anticipated, news of the death of her friend from our [place] and other [place] support groups. Provided empathic listening, pastoral reflection, and spiritual/emotional support as Mandee processed her feelings and questions.   [place], [place], [facility] Pager [phone] Voicemail [phone]

## 2020-03-01 DIAGNOSIS — C9110 Chronic lymphocytic leukemia of B-cell type not having achieved remission: Secondary | ICD-10-CM | POA: Diagnosis not present

## 2020-03-01 DIAGNOSIS — D801 Nonfamilial hypogammaglobulinemia: Secondary | ICD-10-CM | POA: Diagnosis not present

## 2020-03-05 NOTE — Progress Notes (Deleted)
[facility] at [facility] [address], [place], [place] [address] [phone]  Date:  03/06/2020   Name:  Morgan [name]   DOB:  [birth year]   MRN:  [id number]  PCP:  Darreld Mclean, MD    Chief Complaint: No chief complaint on file.   History of Present Illness:  Morgan [name] is a 54 y.o. very pleasant female patient who presents with the following:  Virtual visit today for illness Patient with complex past medical history, specifically CLL currently being treated by [employer] She also has an implanted cardiac loop recorder due to palpitations  Patient location is home, provider location is office Patient identity confirmed with 2 factors, she gives consent for virtual visit today The patient and myself are present on today's call  Patient Active Problem List   Diagnosis Date Noted  . Fever 06/18/2018  . Piriformis syndrome of right side 04/22/2017  . Lateral epicondylitis of left elbow 04/06/2017  . Solitary pulmonary nodule 10/29/2016  . Chronic lymphocytic leukemia ([place]) 07/31/2016  . Mass in neck 04/17/2016  . Abdominal mass 04/17/2016  . Asthma 12/12/2015  . Multiple environmental allergies 12/12/2015  . Chronic lower back pain 09/12/2015  . Bipolar 1 disorder, depressed ([place]) 08/27/2015    Class: Chronic  . Other specified hypothyroidism 08/23/2015  . Borderline personality disorder ([place]) 08/15/2015  . Severe benzodiazepine use disorder ([place]) 08/15/2015  . Alcohol use disorder, moderate, dependence ([place]) 08/15/2015  . PTSD (post-traumatic stress disorder) 08/15/2015  . History of bipolar disorder 08/15/2015  . Pre-syncope 05/17/2015  . Skull deformity 05/17/2015  . Patellofemoral syndrome of both knees 05/02/2015    Past Medical History:  Diagnosis Date  . Anxiety   . Arthritis   . Asthma   . Bipolar disorder ([place])   . Bulging lumbar disc 07/19/05  . Chronic headaches   . Chronic lymphocytic  leukemia ([place]) 07/31/2016  . Colon polyps   . Degenerative disorder of bone   . Depression   . History of borderline personality disorder   . Hypothyroidism 2007   Developed after use of Lithium  . IBS (irritable bowel syndrome) 1995  . Pneumonia   . Proctitis   . Shingles 07/2018  . Substance abuse ([place])    ativan last month    Past Surgical History:  Procedure Laterality Date  . ABDOMINAL HYSTERECTOMY    . APPENDECTOMY    . BUNIONECTOMY Right 06/2012  . CHOLECYSTECTOMY    . OOPHORECTOMY    . SHOULDER OPEN ROTATOR CUFF REPAIR Right 03/2010  . TUBAL LIGATION      Social History   Tobacco Use  . Smoking status: Former Smoker    Quit date: 07/20/1980    Years since quitting: 39.6  . Smokeless tobacco: Never Used  Vaping Use  . Vaping Use: Never used  Substance Use Topics  . Alcohol use: Yes    Alcohol/week: 0.0 standard drinks    Comment: occ  . Drug use: Not Currently    Family History  Problem Relation Age of Onset  . Depression Mother   . Hypertension Mother   . Post-traumatic stress disorder Sister   . Alcohol abuse Brother   . Drug abuse Brother   . Post-traumatic stress disorder Brother   . Thyroid disease Father   . Alzheimer's disease Father   . COPD Maternal Grandmother   . Heart disease Maternal Grandfather   . Arthritis Paternal Grandmother   .  Diabetes Paternal Grandmother   . Depression Paternal Grandmother   . Alzheimer's disease Paternal Grandmother   . Heart disease Paternal Grandfather   . Coronary artery disease Paternal Grandfather   . Alcohol abuse Paternal Grandfather   . Colon cancer Neg Hx   . Breast cancer Neg Hx     Allergies  Allergen Reactions  . Aripiprazole Anaphylaxis and Swelling  . Ciprofloxacin Shortness Of Breath  . Lamotrigine Rash and Other (See Comments)  . Nitrofurantoin Hives  . Other Anaphylaxis, Hives and Swelling    Walnuts and pine nuts  . Peanut Oil Anaphylaxis, Hives, Swelling and Rash  . Amoxicillin  Hives and Rash    Has patient had a PCN reaction causing immediate rash, facial/tongue/throat swelling, SOB or lightheadedness with hypotension: Yes Has patient had a PCN reaction causing severe rash involving mucus membranes or skin necrosis: No Has patient had a PCN reaction that required hospitalization: No Has patient had a PCN reaction occurring within the last 10 years: No If all of the above answers are "NO", then may proceed with Cephalosporin use.  Marland Kitchen Doxycycline Hives and Rash  . Latex Rash and Other (See Comments)  . Meloxicam Itching and Other (See Comments)    Induces mania Interacts with Lithium  . Naproxen Hives and Other (See Comments)    Interacts with lithium  . Pramipexole Hives and Rash  . Prednisone Other (See Comments)    Interacts with Lithium  . Risperidone Hives and Rash  . Sulfa Antibiotics Rash    "that leaves scarring"  . Tape Itching and Rash    Steri-StripsT  . Cortisone Other (See Comments)    Exacerbates the mania of her bipolar  . Percocet [Oxycodone-Acetaminophen] Other (See Comments)    Severe dizziness  . Shingrix [Zoster Vac Recomb Adjuvanted] Swelling    rash  . Vancomycin Itching    Itching on head and at IV insertion site where Vanc running    Medication list has been reviewed and updated.  Current Outpatient Medications on File Prior to Visit  Medication Sig Dispense Refill  . acalabrutinib (CALQUENCE) 100 MG capsule Take 100 mg by mouth every 12 (twelve) hours.     Marland Kitchen acetaminophen (TYLENOL) 500 MG tablet Take 1,000 mg by mouth every 6 (six) hours as needed for mild pain.    Marland Kitchen albuterol (VENTOLIN HFA) 108 (90 Base) MCG/ACT inhaler Inhale 1-2 puffs into the lungs every 6 (six) hours as needed for wheezing or shortness of breath. 18 g 6  . aspirin-acetaminophen-caffeine (EXCEDRIN EXTRA STRENGTH) 250-250-65 MG tablet Take 2 tablets by mouth every 6 (six) hours as needed for headache.    . diphenhydrAMINE (BENADRYL) 25 MG tablet Take 1 tablet  (25 mg total) by mouth every 6 (six) hours as needed for itching. 20 tablet 0  . EPINEPHrine 0.3 mg/0.3 mL IJ SOAJ injection Inject 0.3 mLs (0.3 mg total) into the muscle as needed for anaphylaxis. 1 each prn  . fluticasone furoate-vilanterol (BREO ELLIPTA) 100-25 MCG/INH AEPB Inhale 1 puff into the lungs daily. 1 each 11  . hyoscyamine (LEVSIN) 0.125 MG tablet Take 1 tablet (0.125 mg total) by mouth as needed for cramping. Reported on 09/24/2015 10 tablet 2  . levothyroxine (SYNTHROID) 25 MCG tablet Take 1 tablet (25 mcg total) by mouth daily before breakfast. (Patient taking differently: Take 25 mcg by mouth at bedtime. ) 30 tablet 2  . ondansetron (ZOFRAN) 8 MG tablet Take by mouth every 8 (eight) hours as needed for nausea  or vomiting.    . rizatriptan (MAXALT-MLT) 10 MG disintegrating tablet Take 1 tablet (10 mg total) by mouth as needed for migraine. May repeat in 2 hours if needed 9 tablet 11  . UNABLE TO FIND Take 10 mLs by mouth daily as needed (immune system). Homemade elderberry syrup    . UNABLE TO FIND Take 2.4 mg by mouth daily. Med Name: Lithium OTC    . valACYclovir (VALTREX) 500 MG tablet Take 500 mg by mouth 2 (two) times daily.     No current facility-administered medications on file prior to visit.    Review of Systems:  As per HPI- otherwise negative.   Physical Examination: There were no vitals filed for this visit. There were no vitals filed for this visit. There is no height or weight on file to calculate BMI. Ideal Body Weight:       Assessment and Plan: ***  Signed Lamar Blinks, MD

## 2020-03-06 ENCOUNTER — Telehealth: Payer: Medicare Other | Admitting: Family Medicine

## 2020-03-12 DIAGNOSIS — R070 Pain in throat: Secondary | ICD-10-CM | POA: Insufficient documentation

## 2020-03-14 DIAGNOSIS — C9110 Chronic lymphocytic leukemia of B-cell type not having achieved remission: Secondary | ICD-10-CM | POA: Diagnosis not present

## 2020-03-14 DIAGNOSIS — D801 Nonfamilial hypogammaglobulinemia: Secondary | ICD-10-CM | POA: Diagnosis not present

## 2020-03-19 DIAGNOSIS — D229 Melanocytic nevi, unspecified: Secondary | ICD-10-CM | POA: Diagnosis not present

## 2020-03-19 DIAGNOSIS — L578 Other skin changes due to chronic exposure to nonionizing radiation: Secondary | ICD-10-CM | POA: Diagnosis not present

## 2020-03-19 DIAGNOSIS — D225 Melanocytic nevi of trunk: Secondary | ICD-10-CM | POA: Diagnosis not present

## 2020-03-19 DIAGNOSIS — D239 Other benign neoplasm of skin, unspecified: Secondary | ICD-10-CM | POA: Diagnosis not present

## 2020-03-21 ENCOUNTER — Encounter: Payer: Self-pay | Admitting: General Practice

## 2020-03-21 DIAGNOSIS — Z4509 Encounter for adjustment and management of other cardiac device: Secondary | ICD-10-CM | POA: Diagnosis not present

## 2020-03-21 DIAGNOSIS — R002 Palpitations: Secondary | ICD-10-CM | POA: Diagnosis not present

## 2020-03-21 DIAGNOSIS — R0789 Other chest pain: Secondary | ICD-10-CM | POA: Diagnosis not present

## 2020-03-21 NOTE — Progress Notes (Signed)
[place] Spiritual Care Note  Received and returned check-in voicemail.   [place], [place], [facility] Pager [phone] Voicemail [phone]

## 2020-03-22 ENCOUNTER — Encounter: Payer: Self-pay | Admitting: General Practice

## 2020-03-22 NOTE — Progress Notes (Signed)
[place] Spiritual Care Note  Spoke with Kadian by phone. She is noticing new anxiety about blood clots, particularly when trying to get to sleep. While she asks the medical questions of her providers, she is asking her mental and emotional health team for coping tools. Taught her 4-7-8 breathing and box breathing as means to engage her body and brain in mindfulness practice.   [place], [place], [facility] Pager [phone] Voicemail [phone]

## 2020-03-26 DIAGNOSIS — C9110 Chronic lymphocytic leukemia of B-cell type not having achieved remission: Secondary | ICD-10-CM | POA: Diagnosis not present

## 2020-03-26 DIAGNOSIS — D801 Nonfamilial hypogammaglobulinemia: Secondary | ICD-10-CM | POA: Diagnosis not present

## 2020-04-09 ENCOUNTER — Telehealth: Payer: Self-pay | Admitting: Pharmacist

## 2020-04-09 NOTE — Progress Notes (Addendum)
Chronic Care Management Pharmacy Assistant   Name: Morgan [name]  MRN: [id number] DOB: [birth year]  Reason for Encounter:  General Enhancement   Patient Questions:  1.  Have you seen any other providers since your last visit? Yes  2.  Any changes in your medicines or health?  Yes  PCP : [name], Gay Filler, MD   Their chronic conditions include: Asthma, Hypothyroidism, Bipolar Disorder, Leukemia, Stomach Cramping, Recurrent Shingles, Migraine, Risk of Anaphylaxis  Office visits: 01-18-20(PCP) Patient presented in office with Morgan [name] for pain R side throat.  Provider states patient has tried heat and tylenol.  Patient reported feeling a lump on the r side of her neck that is tender to to touch.  Patient reported she has pain with swallowing and talking.  Provider ordered labs and Ultrasound.  Provider reported labs results Cbcd-- improved and Vita d low normal --- may benefit from vita d 3 1000 u daily otc.  Provider reported  ? Lymph node------ check CT neck.  Please also forward to patient oncologist as well.  Nurse reported CT scheduled 01/23/20. Results sent to oncology.   01-04-20(PCP) Patient presented in office with Morgan [name] for follow-up.  Provider states patient received her first dose of IVIG for CLL treatment on June 7.  She tolerated this treatment pretty well.  Provider reported patient saw cardiology on June 4 for palpitations-they did a cardiac loop recorder implant- done on June 4th.  This will be in place for about 3 years.  Patient reported that she feels like she tolerated the procedure well.  Patient would like provider to check on her site today and make sure it is healing ok.  She was treated with a vancomycin infusion during the procedure and she had an allergic reaction.  Provider reported a change to a new abx and patient reported doing ok.  Provider stated he examined the site and appears to be healing nicely.   Consults: 03-26-20(Oncology) Patient presented  with Nurse for IVIG. No complaints upon arrival to treatment area. Patient was seen in phlebotomy for labs where Port-a-cath was accessed and then by D. Dorothea Ogle, MD for therapy clearance.  03-22-20 (Oncology)  Patient presented with Morgan [name] by phone. Patient stated she is noticing new anxiety about blood clots, particularly when trying to get to sleep. While she asks the medical questions of her providers, she is asking her mental and emotional health team for coping tools. Chaplain stated she taught her 4-7-8 breathing and box breathing as means to engage her body and brain in mindfulness practice.  03-21-20(Cardiology) Patient presented with Morgan [name] for a follow up after recently getting left sided vascular access port placed  General appearance of incision and surrounding skin are normal in appearance today. No significant erythema, swelling, focal pain, or increased warmth. Incision [name] appear normal. No medication changes noted.  03-12-20(Otoaryngology) Patient presented with Morgan [name] as a new patient with a chief complaint of throat concern. She was diagnosed with CLL in early 2018. She was treated with chemotherapy for a couple of months in 2019. She started treatment again in late April due to decline in her health. She continues on regular treatment. Provider stated patient numbers are looking better and lymph node enlargement has improved. She is doing IVIG treatments due to chronic shingles. Provider reported patient's CT findings are not likely related to her symptoms.  No medications changes noted.  03-01-20(Oncology) Patient presented with nurse for IVIG therapy. Shortly after increasing infusion rate to  300 ml/hr, patient complained of joint pain in toes and fingers. Nurse stated patient also developed peripheral neuropathy-like pain in her hands. Nurse reported decreasing rate to 200 mL/hr and pt tolerated rest of infusion (joint pain continued, but did not worsen). Nurse  reported patient will most likely tolerate rate of 100 mL/hr in the future (she has been symptom-free at this rate in the past).  01-29-20(Oncology)  Patient presented with Morgan [name] for a scheduled follow up since starting acalabrutinib on 11/13/19. Hemoglobin, platelet counts, and ANC all remain normal. Provider recommended to avoid aspirin, NSAIDs, or other medications with antiplatelet effects as in combination with B TK inhibitors can increase bleeding or bruising risks.  Provider stated patient stopped allopurinol one week ago; ALC/WBC stable and uric acid wnl, can remain off.  Hospitalizations: 02-15-20([facility])  Patient is presented last attending provider Morgan [name] with a chief complaint of chest tightness, shortness of breath, generalized body aches, fatigue and sore throat.  For the past 5 days patient reported having intermittent body aches and shortness of breath.   No lower extremity edema, erythema or calf tenderness that concern for DVT.  Provider reported no tachycardic, tachypneic or hypoxic. Lungs are clear to auscultation bilaterally.Provider stated EKG here shows normal sinus rhythm. Chest x-ray is unremarkable. BMP, CBC and troponin is negative x1. Covid test here is negative. CT angio of the chest was done to rule out PE, provider states patient is at risk due to her ongoing chemotherapy and her age. This was negative for PE or other acute abnormality. Provider suspected that patient symptoms are viral in nature. Doubt ACS as she is chest pain-free all of today and her EKG and troponin are unremarkable. Patient states she is comfortable with continuing her home medications and following up with her PCP.  Last appointment with CCM was 01-02-20.  Allergies:   Allergies  Allergen Reactions  . Aripiprazole Anaphylaxis and Swelling  . Ciprofloxacin Shortness Of Breath  . Lamotrigine Rash and Other (See Comments)  . Nitrofurantoin Hives  . Other  Anaphylaxis, Hives and Swelling    Walnuts and pine nuts  . Peanut Oil Anaphylaxis, Hives, Swelling and Rash  . Amoxicillin Hives and Rash    Has patient had a PCN reaction causing immediate rash, facial/tongue/throat swelling, SOB or lightheadedness with hypotension: Yes Has patient had a PCN reaction causing severe rash involving mucus membranes or skin necrosis: No Has patient had a PCN reaction that required hospitalization: No Has patient had a PCN reaction occurring within the last 10 years: No If all of the above answers are "NO", then may proceed with Cephalosporin use.  Marland Kitchen Doxycycline Hives and Rash  . Latex Rash and Other (See Comments)  . Meloxicam Itching and Other (See Comments)    Induces mania Interacts with Lithium  . Naproxen Hives and Other (See Comments)    Interacts with lithium  . Pramipexole Hives and Rash  . Prednisone Other (See Comments)    Interacts with Lithium  . Risperidone Hives and Rash  . Sulfa Antibiotics Rash    "that leaves scarring"  . Tape Itching and Rash    Steri-StripsT  . Cortisone Other (See Comments)    Exacerbates the mania of her bipolar  . Percocet [Oxycodone-Acetaminophen] Other (See Comments)    Severe dizziness  . Shingrix [Zoster Vac Recomb Adjuvanted] Swelling    rash  . Vancomycin Itching    Itching on head and at IV insertion site where Vanc running  Medications: Outpatient Encounter Medications as of 04/09/2020  Medication Sig  . acalabrutinib (CALQUENCE) 100 MG capsule Take 100 mg by mouth every 12 (twelve) hours.   Marland Kitchen acetaminophen (TYLENOL) 500 MG tablet Take 1,000 mg by mouth every 6 (six) hours as needed for mild pain.  Marland Kitchen albuterol (VENTOLIN HFA) 108 (90 Base) MCG/ACT inhaler Inhale 1-2 puffs into the lungs every 6 (six) hours as needed for wheezing or shortness of breath.  Marland Kitchen aspirin-acetaminophen-caffeine (EXCEDRIN EXTRA STRENGTH) 250-250-65 MG tablet Take 2 tablets by mouth every 6 (six) hours as needed for headache.    . diphenhydrAMINE (BENADRYL) 25 MG tablet Take 1 tablet (25 mg total) by mouth every 6 (six) hours as needed for itching.  Marland Kitchen EPINEPHrine 0.3 mg/0.3 mL IJ SOAJ injection Inject 0.3 mLs (0.3 mg total) into the muscle as needed for anaphylaxis.  . fluticasone furoate-vilanterol (BREO ELLIPTA) 100-25 MCG/INH AEPB Inhale 1 puff into the lungs daily.  . hyoscyamine (LEVSIN) 0.125 MG tablet Take 1 tablet (0.125 mg total) by mouth as needed for cramping. Reported on 09/24/2015  . levothyroxine (SYNTHROID) 25 MCG tablet Take 1 tablet (25 mcg total) by mouth daily before breakfast. (Patient taking differently: Take 25 mcg by mouth at bedtime. )  . ondansetron (ZOFRAN) 8 MG tablet Take by mouth every 8 (eight) hours as needed for nausea or vomiting.  . rizatriptan (MAXALT-MLT) 10 MG disintegrating tablet Take 1 tablet (10 mg total) by mouth as needed for migraine. May repeat in 2 hours if needed  . UNABLE TO FIND Take 10 mLs by mouth daily as needed (immune system). Homemade elderberry syrup  . UNABLE TO FIND Take 2.4 mg by mouth daily. Med Name: Lithium OTC  . valACYclovir (VALTREX) 500 MG tablet Take 500 mg by mouth 2 (two) times daily.   No facility-administered encounter medications on file as of 04/09/2020.    Current Diagnosis: Patient Active Problem List   Diagnosis Date Noted  . Fever 06/18/2018  . Piriformis syndrome of right side 04/22/2017  . Lateral epicondylitis of left elbow 04/06/2017  . Solitary pulmonary nodule 10/29/2016  . Chronic lymphocytic leukemia ([place]) 07/31/2016  . Mass in neck 04/17/2016  . Abdominal mass 04/17/2016  . Asthma 12/12/2015  . Multiple environmental allergies 12/12/2015  . Chronic lower back pain 09/12/2015  . Bipolar 1 disorder, depressed ([place]) 08/27/2015    Class: Chronic  . Other specified hypothyroidism 08/23/2015  . Borderline personality disorder ([place]) 08/15/2015  . Severe benzodiazepine use disorder ([place]) 08/15/2015  . Alcohol use disorder, moderate,  dependence ([place]) 08/15/2015  . PTSD (post-traumatic stress disorder) 08/15/2015  . History of bipolar disorder 08/15/2015  . Pre-syncope 05/17/2015  . Skull deformity 05/17/2015  . Patellofemoral syndrome of both knees 05/02/2015    Goals Addressed   None    Patient states she is doing alright.  Patient states she was taking an OTC Mineral supplement to help with her constipation but had an allergic reaction.  States medication made her blood pressure go up and made her feel weird.  Stopped taking medication this week Wednesday.  States it was helping with the constipation.  Patient states she has a high fiber diet and drinks lots of water.  States she  has a hard time disgesting raw vegetables. 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  Patient states she is having problems with her feet and would like to meet with Dr. Lorelei Pont to talk to her about it.  States she does not know if it  is due to her treatments.   Patient admits ED visit since on 02-15-20.   Patient denies any side effects with current prescribed medication. Patient denies any problems with current pharmacy [facility].  Follow-Up:  Pharmacist Review    [place] Shannon, [place] [facility] [place] Pharmacist Assistant [phone]  Reviewed by: De Blanch, PharmD Clinical Pharmacist [facility] [phone]

## 2020-04-12 ENCOUNTER — Telehealth: Payer: Self-pay | Admitting: Pharmacist

## 2020-04-12 NOTE — Progress Notes (Addendum)
Chronic Care Management Pharmacy Assistant   Name: Chelise Hanger  MRN: [id number] DOB: [birth year]  Reason for Encounter: Patient Call  PCP : Darreld Mclean, MD  Allergies:   Allergies  Allergen Reactions  . Aripiprazole Anaphylaxis and Swelling  . Ciprofloxacin Shortness Of Breath  . Lamotrigine Rash and Other (See Comments)  . Nitrofurantoin Hives  . Other Anaphylaxis, Hives and Swelling    Walnuts and pine nuts  . Peanut Oil Anaphylaxis, Hives, Swelling and Rash  . Amoxicillin Hives and Rash    Has patient had a PCN reaction causing immediate rash, facial/tongue/throat swelling, SOB or lightheadedness with hypotension: Yes Has patient had a PCN reaction causing severe rash involving mucus membranes or skin necrosis: No Has patient had a PCN reaction that required hospitalization: No Has patient had a PCN reaction occurring within the last 10 years: No If all of the above answers are "NO", then may proceed with Cephalosporin use.  Marland Kitchen Doxycycline Hives and Rash  . Latex Rash and Other (See Comments)  . Meloxicam Itching and Other (See Comments)    Induces mania Interacts with Lithium  . Naproxen Hives and Other (See Comments)    Interacts with lithium  . Pramipexole Hives and Rash  . Prednisone Other (See Comments)    Interacts with Lithium  . Risperidone Hives and Rash  . Sulfa Antibiotics Rash    "that leaves scarring"  . Tape Itching and Rash    Steri-StripsT  . Cortisone Other (See Comments)    Exacerbates the mania of her bipolar  . Percocet [Oxycodone-Acetaminophen] Other (See Comments)    Severe dizziness  . Shingrix [Zoster Vac Recomb Adjuvanted] Swelling    rash  . Vancomycin Itching    Itching on head and at IV insertion site where Vanc running    Medications: Outpatient Encounter Medications as of 04/12/2020  Medication Sig  . acalabrutinib (CALQUENCE) 100 MG capsule Take 100 mg by mouth every 12 (twelve) hours.   Marland Kitchen acetaminophen (TYLENOL) 500 MG  tablet Take 1,000 mg by mouth every 6 (six) hours as needed for mild pain.  Marland Kitchen albuterol (VENTOLIN HFA) 108 (90 Base) MCG/ACT inhaler Inhale 1-2 puffs into the lungs every 6 (six) hours as needed for wheezing or shortness of breath.  Marland Kitchen aspirin-acetaminophen-caffeine (EXCEDRIN EXTRA STRENGTH) 250-250-65 MG tablet Take 2 tablets by mouth every 6 (six) hours as needed for headache.  . diphenhydrAMINE (BENADRYL) 25 MG tablet Take 1 tablet (25 mg total) by mouth every 6 (six) hours as needed for itching.  Marland Kitchen EPINEPHrine 0.3 mg/0.3 mL IJ SOAJ injection Inject 0.3 mLs (0.3 mg total) into the muscle as needed for anaphylaxis.  . fluticasone furoate-vilanterol (BREO ELLIPTA) 100-25 MCG/INH AEPB Inhale 1 puff into the lungs daily.  . hyoscyamine (LEVSIN) 0.125 MG tablet Take 1 tablet (0.125 mg total) by mouth as needed for cramping. Reported on 09/24/2015  . levothyroxine (SYNTHROID) 25 MCG tablet Take 1 tablet (25 mcg total) by mouth daily before breakfast. (Patient taking differently: Take 25 mcg by mouth at bedtime. )  . ondansetron (ZOFRAN) 8 MG tablet Take by mouth every 8 (eight) hours as needed for nausea or vomiting.  . rizatriptan (MAXALT-MLT) 10 MG disintegrating tablet Take 1 tablet (10 mg total) by mouth as needed for migraine. May repeat in 2 hours if needed  . UNABLE TO FIND Take 10 mLs by mouth daily as needed (immune system). Homemade elderberry syrup  . UNABLE TO FIND Take 2.4 mg by mouth daily.  Med Name: Lithium OTC  . valACYclovir (VALTREX) 500 MG tablet Take 500 mg by mouth 2 (two) times daily.   No facility-administered encounter medications on file as of 04/12/2020.    Current Diagnosis: Patient Active Problem List   Diagnosis Date Noted  . Fever 06/18/2018  . Piriformis syndrome of right side 04/22/2017  . Lateral epicondylitis of left elbow 04/06/2017  . Solitary pulmonary nodule 10/29/2016  . Chronic lymphocytic leukemia ([place]) 07/31/2016  . Mass in neck 04/17/2016  . Abdominal  mass 04/17/2016  . Asthma 12/12/2015  . Multiple environmental allergies 12/12/2015  . Chronic lower back pain 09/12/2015  . Bipolar 1 disorder, depressed ([place]) 08/27/2015    Class: Chronic  . Other specified hypothyroidism 08/23/2015  . Borderline personality disorder ([place]) 08/15/2015  . Severe benzodiazepine use disorder ([place]) 08/15/2015  . Alcohol use disorder, moderate, dependence ([place]) 08/15/2015  . PTSD (post-traumatic stress disorder) 08/15/2015  . History of bipolar disorder 08/15/2015  . Pre-syncope 05/17/2015  . Skull deformity 05/17/2015  . Patellofemoral syndrome of both knees 05/02/2015    Goals Addressed   None    Contacted the patient to review if there was anything specific or urgent that she would like to discuss with the clinical pharmacist. Patient stated she did not have anything of urgency or specific that she needed. Reviewed the schedule times and availability for the patient to accommodate her CCM visit. Reviewed chart prior to disease state call. Spoke with patient regarding BP  Follow-Up:  Scheduled Follow-Up With Clinical Pharmacist on Wednesday October 20th at 10 am over the telephone.  Fanny Skates, [place] Pharmacist Assistant [phone]   Reviewed by: De Blanch, PharmD Clinical Pharmacist [facility] [facility] [phone]

## 2020-04-17 ENCOUNTER — Encounter: Payer: Self-pay | Admitting: General Practice

## 2020-04-17 NOTE — Progress Notes (Signed)
[place] Spiritual Care Note  Missed Allyne at our 1:00 Webex viritual visit this afternoon, so left voicemail requesting return call.   [place], [place], [facility] Pager [phone] Voicemail [phone]

## 2020-04-18 ENCOUNTER — Telehealth: Payer: Self-pay | Admitting: Pharmacist

## 2020-04-18 NOTE — Progress Notes (Addendum)
Verified Adherence Gap Information.   Per insurance data, patient has met the following star measures:  Breast cancer screening  Colorectal cancer screening.  Patient has not met the following star measures:  AWV  Patient is not currently prescribed star measure medications.  [place] Shannon, [place] [facility] [place] Pharmacist Assistant [phone]  Reviewed by: De Blanch, PharmD Clinical Pharmacist [name] at [facility] [phone]

## 2020-04-19 ENCOUNTER — Encounter: Payer: Self-pay | Admitting: General Practice

## 2020-04-19 NOTE — Progress Notes (Signed)
[place] Note  Spoke with Morgan [name] by phone for closing Spiritual Care session prior to my being out on leave during October. 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 She is aware of ongoing interim chaplain availability in October, and we plan to connect again the first week in November.   [place], [place], [facility] Pager [phone] Voicemail [phone]

## 2020-04-22 DIAGNOSIS — I10 Essential (primary) hypertension: Secondary | ICD-10-CM | POA: Diagnosis not present

## 2020-04-22 DIAGNOSIS — R110 Nausea: Secondary | ICD-10-CM | POA: Diagnosis not present

## 2020-04-22 DIAGNOSIS — M549 Dorsalgia, unspecified: Secondary | ICD-10-CM | POA: Diagnosis not present

## 2020-04-22 DIAGNOSIS — R1030 Lower abdominal pain, unspecified: Secondary | ICD-10-CM | POA: Insufficient documentation

## 2020-04-22 DIAGNOSIS — M542 Cervicalgia: Secondary | ICD-10-CM | POA: Insufficient documentation

## 2020-04-22 DIAGNOSIS — Z5321 Procedure and treatment not carried out due to patient leaving prior to being seen by health care provider: Secondary | ICD-10-CM | POA: Insufficient documentation

## 2020-04-22 DIAGNOSIS — I959 Hypotension, unspecified: Secondary | ICD-10-CM | POA: Diagnosis not present

## 2020-04-22 DIAGNOSIS — R079 Chest pain, unspecified: Secondary | ICD-10-CM | POA: Diagnosis not present

## 2020-04-22 DIAGNOSIS — R0789 Other chest pain: Secondary | ICD-10-CM | POA: Diagnosis not present

## 2020-04-22 NOTE — ED Triage Notes (Signed)
Pt spoke with oncologist who wanted her to have blood work done because she has swollen lymphnoded. Pt reports chest and back pain denies cardiac hx.

## 2020-04-22 NOTE — ED Triage Notes (Signed)
Pt has Hx leukemia  C/o chest and back pain onset today 4/10 . Pt also reports painful lymph nodes neck and groin areas.

## 2020-04-23 ENCOUNTER — Emergency Department (HOSPITAL_COMMUNITY)
Admission: EM | Admit: 2020-04-23 | Discharge: 2020-04-23 | Disposition: A | Discharge status: Home / Self Care | Payer: Medicare Other | Attending: Emergency Medicine | Admitting: Emergency Medicine

## 2020-04-26 DIAGNOSIS — D801 Nonfamilial hypogammaglobulinemia: Secondary | ICD-10-CM | POA: Diagnosis not present

## 2020-04-26 DIAGNOSIS — C9110 Chronic lymphocytic leukemia of B-cell type not having achieved remission: Secondary | ICD-10-CM | POA: Diagnosis not present

## 2020-05-03 ENCOUNTER — Telehealth: Payer: Medicare Other

## 2020-05-08 ENCOUNTER — Ambulatory Visit: Payer: Medicare Other | Admitting: Pharmacist

## 2020-05-08 DIAGNOSIS — E039 Hypothyroidism, unspecified: Secondary | ICD-10-CM

## 2020-05-08 DIAGNOSIS — J45909 Unspecified asthma, uncomplicated: Secondary | ICD-10-CM

## 2020-05-08 NOTE — Chronic Care Management (AMB) (Signed)
Chronic Care Management Pharmacy  Name: Morgan [name]  MRN: [id number] DOB: 04-15-1966  Chief Complaint/ HPI  Morgan [name],  54 y.o. , female presents for their Follow-Up CCM visit with the clinical pharmacist via telephone due to COVID-19 Pandemic.  PCP : Morgan Mclean, MD  Their chronic conditions include: Asthma, Hypothyroidism, Bipolar Disorder, Leukemia, Stomach Cramping, Recurrent Shingles, Migraine, Risk of Anaphylaxis  Office Visits: 01/18/20: Visit w/ Dr. Etter Sjogren - Neck pain. Labs order. Consider muscle relaxer, but would like to wait. If WBC elevated follow up with oncologist.  01/04/20: Visit w/ Dr. Lorelei Pont - Status post placement of implantable loop recorder. Site healing nicely.  Refills ordered including epinephrine and maxalt  Consult Visit: 04/26/20: Morgan [name] visit w/ Dr. Dorothea Ogle - IVIG administered. Pt tolerated treatment well.   ED Visit:  02/15/20: Viral URI - Covid negative  Medications: Outpatient Encounter Medications as of 05/08/2020  Medication Sig  . acalabrutinib (CALQUENCE) 100 MG capsule Take 100 mg by mouth every 12 (twelve) hours.   Marland Kitchen acetaminophen (TYLENOL) 500 MG tablet Take 1,000 mg by mouth every 6 (six) hours as needed for mild pain.  Marland Kitchen albuterol (VENTOLIN HFA) 108 (90 Base) MCG/ACT inhaler Inhale 1-2 puffs into the lungs every 6 (six) hours as needed for wheezing or shortness of breath.  Marland Kitchen aspirin-acetaminophen-caffeine (EXCEDRIN EXTRA STRENGTH) 250-250-65 MG tablet Take 2 tablets by mouth every 6 (six) hours as needed for headache.  . diphenhydrAMINE (BENADRYL) 25 MG tablet Take 1 tablet (25 mg total) by mouth every 6 (six) hours as needed for itching.  Marland Kitchen EPINEPHrine 0.3 mg/0.3 mL IJ SOAJ injection Inject 0.3 mLs (0.3 mg total) into the muscle as needed for anaphylaxis.  . fluticasone furoate-vilanterol (BREO ELLIPTA) 100-25 MCG/INH AEPB Inhale 1 puff into the lungs daily.  . hyoscyamine (LEVSIN) 0.125 MG tablet Take 1 tablet (0.125 mg  total) by mouth as needed for cramping. Reported on 09/24/2015  . levothyroxine (SYNTHROID) 25 MCG tablet Take 1 tablet (25 mcg total) by mouth daily before breakfast. (Patient taking differently: Take 25 mcg by mouth at bedtime. )  . ondansetron (ZOFRAN) 8 MG tablet Take by mouth every 8 (eight) hours as needed for nausea or vomiting.  . rizatriptan (MAXALT-MLT) 10 MG disintegrating tablet Take 1 tablet (10 mg total) by mouth as needed for migraine. May repeat in 2 hours if needed  . UNABLE TO FIND Take 10 mLs by mouth daily as needed (immune system). Homemade elderberry syrup  . UNABLE TO FIND Take 2.4 mg by mouth daily. Med Name: Lithium OTC  . valACYclovir (VALTREX) 500 MG tablet Take 500 mg by mouth 2 (two) times daily.   No facility-administered encounter medications on file as of 05/08/2020.     Current Diagnosis/Assessment:  Goals Addressed            This Visit's Progress   . Chronic Care Mangement Pharmacy Care Plan       CARE PLAN ENTRY  Current Barriers:  . Chronic Disease Management support, education, and care coordination needs related to Asthma, Hypothyroidism, Bipolar Disorder, Leukemia, Stomach Cramping, Recurrent Shingles, Migraine, Allergy  Asthma . Pharmacist Clinical Goal(s) o Over the next 120 days, patient will work with PharmD and providers to reduce symptoms of asthma . Current regimen:  o Albuterol inhaler (ProAir) as needed o Breo Ellipta 100-48mcg 1 puff daily . Patient self care activities - Over the next 120 days, patient will: o Maintain asthma medication regimen  Migraine . Pharmacist Clinical Goal(s) o  Over the next 120 days, patient will work with PharmD and providers to reduce symptoms of migraine . Current regimen:  o Rizatriptan 10mg  as needed o Excedrin migraine as needed . Interventions: o Recommended patient to continue follow up with Dr. Leta Baptist . Patient self care activities - Over the next 120 days, patient will: o Maintain  migraine medication regimen  Hypothyroidism . Pharmacist Clinical Goal(s) o Over the next 120 days, patient will work with PharmD and providers to reduce symptoms of hypothyroidism . Current regimen:  o Levothyroxine 72mcg daily . Patient self care activities - Over the next 120 days, patient will: o Maintain thyroid medication regimen    Medication management . Pharmacist Clinical Goal(s): o Over the next 120 days, patient will work with PharmD and providers to maintain optimal medication adherence . Current pharmacy: [facility] . Interventions o Comprehensive medication review performed. o Continue current medication management strategy . Patient self care activities - Over the next 120 days, patient will: o Focus on medication adherence by filling and taking medications appropriately  o Take medications as prescribed o Report any questions or concerns to PharmD and/or provider(s)  Please see past updates related to this goal by clicking on the "Past Updates" button in the selected goal       Social Hx:  [place] to [place].  Originally from [place]. Lived in [place] and [place] where she raised her kids.  Has lived in [place] for 9 years.  Son in ER often, so she decided to stay on [place] for him.  She moved to [place] after 3 months in [place]. Has a son ([place]) and daughter (lives locally).  Exercise:  Started an exercise workout yesterday. Does 10 minutes 3 times per Deniz Eskridge of jumping on small trampoline.   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  Update 05/08/20 Going to beach next week.  Asthma / Tobacco   Last spirometry score: 10/29/2016 FCV: 3.2 FEV1: 2.8 FEV1/FVC: 87%  Eosinophil count:   Lab Results  Component Value Date/Time   EOSPCT 2.6 01/18/2020 02:51 PM   EOSPCT 2.2 09/04/2016 07:54 AM  %                               Eos (Absolute):  Lab Results  Component Value Date/Time   EOSABS 0.2 01/18/2020  02:51 PM   EOSABS 0.3 09/04/2016 07:54 AM    Tobacco Status:  Social History   Tobacco Use  Smoking Status Former Smoker  . Quit date: 07/20/1980  . Years since quitting: 39.8  Smokeless Tobacco Never Used    Patient has failed these meds in past: None noted  Patient is currently controlled on the following medications:   Albuterol inhaler (ProAir) as needed  Breo Ellipta 100-39mcg 1 puff daily Using maintenance inhaler regularly? No Frequency of rescue inhaler use:  infrequently (during the spring and fall)  Feels like the heat affects her asthma, so she tries to stay inside. Prefers to use Breo as rescue inhaler. States she is using Breo more frequently due to having to use mask more often. She feels like the Breo affects her bipolar disorder.  ProAir inhaler will expire this month.  Breo Ellipta will expire next month.  Pt has appt with PCP and will mention need for refills.  Update 05/08/20 Has refills, but hasn't used her inhalers.  Feels her breathing is fine.  Plan -Continue current medications   Hypothyroidism   Lab Results  Component Value Date/Time   TSH 1.89 01/18/2020 02:51 PM   TSH 2.24 12/07/2019 04:57 PM   TSH 3.220 07/26/2019 01:07 PM   Patient has failed these meds in past: None noted  Patient is currently controlled on the following medications:   Levothyroxine 31mcg daily  Update 05/08/20 TSH WNL  Plan -Continue current medications   Depression Screening   Depression screen [facility] 2/9 05/08/2020 05/16/2019 05/05/2018  Decreased Interest 1 1 0  Down, Depressed, Hopeless 0 1 1  PHQ - 2 Score 1 2 1   Altered sleeping 2 1 -  Tired, decreased energy 1 1 -  Change in appetite 1 1 -  Feeling bad or failure about yourself  1 1 -  Trouble concentrating 1 - -  Moving slowly or fidgety/restless 1 1 -  Suicidal thoughts 1 0 -  PHQ-9 Score 9 7 -  Difficult doing work/chores - Somewhat difficult -  Some recent data might be hidden   Patient has  failed these meds in past: Patient states she has tried and failed many meds, can't specify which  Patient is currently uncontrolled on the following medications:  . None (being followed by Chaplain for emotional support)  Talks to her therapist about her fleeting suicidal thoughts.  Wants to focus on getting her strength from God rather than people Does not want to try any mental health meds Is not currently feeling suicidal. Encouraged patient to continue follow up with chaplain  Plan -Continue following with chaplain for emotional support   Stomach Cramping    Patient has failed these meds in past: None noted  Patient is currently controlled on the following medications:   Hyoscyamine 0.125mg  as needed  "This is my wonder drug" She has to take this at first sign of pain. The pain is so severe that she can't do anything afterwards. She has one remaining.   To get refills from Dr. Lorelei Pont  Update 05/08/20 Received refills of hyoscyamine.  Plan -Continue current medications   Migraine    Patient has failed these meds in past: None noted  Patient is currently uncontrolled on the following medications:   Rizatriptan 10mg  as needed, excedrin migraine as needed   Followed by neuro Dr. Earlean Polka. Penumalli. Does not have followup appt scheduled.  States that excedrin works better than tylenol. Last dose was June 4 after her procedure.  She is starting to have more frequent migraines. With her worst migraines she can't function. She will try tylenol first and if that does not resolve it then she would move on to rizatriptan.    Update 05/08/20 Received refills of rizatriptan  Plan -Continue current medications   Foot Pain?    Patient has failed these meds in past: None noted  Patient is currently uncontrolled on the following medications: . None  Lump on bottom of her foot that feels itchy.  Hasn't gone away in a year.  Feels it has gotten worse. Flares for a  week, then resolves. Encouraged pt to followup with Dr. Lorelei Pont.  Plan -Continue current medications   Risk of Anaphylaxis    Patient has failed these meds in past: None noted  Patient is currently stable on the following medications: None noted   States her epipen is expired.  Wonders if she should get a new pen.  Discussed risk/benefit of expired pen vs new pen  To get refill from Dr. Lorelei Pont  Update 05/08/20  Received refill of epipen  Plan -Continue current management   Miscellaneous Meds Tylenol Benadryl (for allergies and sleep. Hasn't used since reaction with vaccine) Cephalexin (never took, but has it home in case of cellulitis noting she is at increased risk for infection)   De Blanch, PharmD Clinical Pharmacist [facility] at [facility] [phone]

## 2020-05-08 NOTE — Patient Instructions (Signed)
Visit Information  Goals Addressed            This Visit's Progress   . Chronic Care Mangement Pharmacy Care Plan       CARE PLAN ENTRY  Current Barriers:  . Chronic Disease Management support, education, and care coordination needs related to Asthma, Hypothyroidism, Bipolar Disorder, Leukemia, Stomach Cramping, Recurrent Shingles, Migraine, Allergy  Asthma . Pharmacist Clinical Goal(s) o Over the next 120 days, patient will work with PharmD and providers to reduce symptoms of asthma . Current regimen:  o Albuterol inhaler (ProAir) as needed o Breo Ellipta 100-81mcg 1 puff daily . Patient self care activities - Over the next 120 days, patient will: o Maintain asthma medication regimen  Migraine . Pharmacist Clinical Goal(s) o Over the next 120 days, patient will work with PharmD and providers to reduce symptoms of migraine . Current regimen:  o Rizatriptan 10mg  as needed o Excedrin migraine as needed . Interventions: o Recommended patient to continue follow up with Dr. Leta Baptist . Patient self care activities - Over the next 120 days, patient will: o Maintain migraine medication regimen  Hypothyroidism . Pharmacist Clinical Goal(s) o Over the next 120 days, patient will work with PharmD and providers to reduce symptoms of hypothyroidism . Current regimen:  o Levothyroxine 62mcg daily . Patient self care activities - Over the next 120 days, patient will: o Maintain thyroid medication regimen    Medication management . Pharmacist Clinical Goal(s): o Over the next 120 days, patient will work with PharmD and providers to maintain optimal medication adherence . Current pharmacy: [facility] . Interventions o Comprehensive medication review performed. o Continue current medication management strategy . Patient self care activities - Over the next 120 days, patient will: o Focus on medication adherence by filling and taking medications appropriately  o Take medications as  prescribed o Report any questions or concerns to PharmD and/or provider(s)  Please see past updates related to this goal by clicking on the "Past Updates" button in the selected goal         The patient verbalized understanding of instructions provided today and agreed to receive a mailed copy of patient instruction and/or educational materials.  Telephone follow up appointment with pharmacy team member scheduled for: 08/08/2020  Melvenia Beam Yazeed Pryer, PharmD Clinical Pharmacist [facility] at [facility] [phone]

## 2020-05-10 DIAGNOSIS — C9110 Chronic lymphocytic leukemia of B-cell type not having achieved remission: Secondary | ICD-10-CM | POA: Diagnosis not present

## 2020-05-20 ENCOUNTER — Encounter: Payer: Self-pay | Admitting: General Practice

## 2020-05-20 NOTE — Progress Notes (Signed)
[place] Spiritual Care Note  Spoke with Akeema by phone for a pastoral check-in. She used the opportunity to share and process recent health stressors as well as several blessings, ranging from the joy of a dolphin cruise to the anticipatory relief of some forthcoming financial assistance.  Despite a number of challenges, she was able to identify many positives that contribute to her resilience and meaning-making. In particular, she is looking forward to a holiday visit with her son in [place]. [place] regularly for spiritual and emotional support and plans to reach out as needed.   [place], [place], [facility] Pager [phone] Voicemail [phone]

## 2020-05-21 DIAGNOSIS — R911 Solitary pulmonary nodule: Secondary | ICD-10-CM | POA: Diagnosis not present

## 2020-05-21 DIAGNOSIS — Z23 Encounter for immunization: Secondary | ICD-10-CM | POA: Diagnosis not present

## 2020-05-21 DIAGNOSIS — C9110 Chronic lymphocytic leukemia of B-cell type not having achieved remission: Secondary | ICD-10-CM | POA: Diagnosis not present

## 2020-05-21 DIAGNOSIS — Z79899 Other long term (current) drug therapy: Secondary | ICD-10-CM | POA: Diagnosis not present

## 2020-05-21 DIAGNOSIS — R42 Dizziness and giddiness: Secondary | ICD-10-CM | POA: Diagnosis not present

## 2020-05-21 DIAGNOSIS — J45909 Unspecified asthma, uncomplicated: Secondary | ICD-10-CM | POA: Diagnosis not present

## 2020-05-21 DIAGNOSIS — D801 Nonfamilial hypogammaglobulinemia: Secondary | ICD-10-CM | POA: Diagnosis not present

## 2020-05-21 DIAGNOSIS — I493 Ventricular premature depolarization: Secondary | ICD-10-CM | POA: Diagnosis not present

## 2020-05-21 DIAGNOSIS — B028 Zoster with other complications: Secondary | ICD-10-CM | POA: Diagnosis not present

## 2020-05-21 DIAGNOSIS — G629 Polyneuropathy, unspecified: Secondary | ICD-10-CM | POA: Diagnosis not present

## 2020-05-21 DIAGNOSIS — K589 Irritable bowel syndrome without diarrhea: Secondary | ICD-10-CM | POA: Diagnosis not present

## 2020-05-21 DIAGNOSIS — M79671 Pain in right foot: Secondary | ICD-10-CM | POA: Diagnosis not present

## 2020-05-21 DIAGNOSIS — R002 Palpitations: Secondary | ICD-10-CM | POA: Diagnosis not present

## 2020-05-21 DIAGNOSIS — Z87891 Personal history of nicotine dependence: Secondary | ICD-10-CM | POA: Diagnosis not present

## 2020-05-21 DIAGNOSIS — M79672 Pain in left foot: Secondary | ICD-10-CM | POA: Diagnosis not present

## 2020-05-21 DIAGNOSIS — R12 Heartburn: Secondary | ICD-10-CM | POA: Diagnosis not present

## 2020-05-21 DIAGNOSIS — E039 Hypothyroidism, unspecified: Secondary | ICD-10-CM | POA: Diagnosis not present

## 2020-05-22 ENCOUNTER — Telehealth: Payer: Self-pay | Admitting: Pharmacist

## 2020-05-22 NOTE — Progress Notes (Addendum)
Chronic Care Management Pharmacy Assistant   Name: Morgan [name]  MRN: [id number] DOB: 29-Jan-1966  Reason for Encounter: Medication Review/ General Adherence  Patient Questions:  1.  Have you seen any other providers since your last visit? No  2.  Any changes in your medicines or health? No    PCP : Copland, Gay Filler, MD   Their chronic conditions include:Asthma, Hypothyroidism, Bipolar Disorder, Leukemia, Stomach Cramping, Recurrent Shingles, Migraine, Risk of Anaphylaxis  Consults: 05-20-20([facility]) Patient was presented by phone with [name] for pastoral check in.  No medications changes. 05-08-20 ([facility]) Patient was presented with Everlene Other on Telemedicine.  No medication changes. 04-26-20(Oncology) Patient was presented with Lanette Hampshire in office for a follow up. No medications changes.   No Office visits or Hospitalizations since last CCM visit.  Allergies:   Allergies  Allergen Reactions   Aripiprazole Anaphylaxis and Swelling   Ciprofloxacin Shortness Of Breath   Lamotrigine Rash and Other (See Comments)   Nitrofurantoin Hives   Other Anaphylaxis, Hives and Swelling    Walnuts and pine nuts   Peanut Oil Anaphylaxis, Hives, Swelling and Rash   Amoxicillin Hives and Rash    Has patient had a PCN reaction causing immediate rash, facial/tongue/throat swelling, SOB or lightheadedness with hypotension: Yes Has patient had a PCN reaction causing severe rash involving mucus membranes or skin necrosis: No Has patient had a PCN reaction that required hospitalization: No Has patient had a PCN reaction occurring within the last 10 years: No If all of the above answers are "NO", then may proceed with Cephalosporin use.   Doxycycline Hives and Rash   Latex Rash and Other (See Comments)   Meloxicam Itching and Other (See Comments)    Induces mania Interacts with Lithium   Naproxen Hives and Other (See Comments)    Interacts with  lithium   Pramipexole Hives and Rash   Prednisone Other (See Comments)    Interacts with Lithium   Risperidone Hives and Rash   Sulfa Antibiotics Rash    "that leaves scarring"   Tape Itching and Rash    Steri-Strips   Cortisone Other (See Comments)    Exacerbates the mania of her bipolar   Percocet [Oxycodone-Acetaminophen] Other (See Comments)    Severe dizziness   Shingrix [Zoster Vac Recomb Adjuvanted] Swelling    rash   Vancomycin Itching    Itching on head and at IV insertion site where Vanc running    Medications: Outpatient Encounter Medications as of 05/22/2020  Medication Sig   acalabrutinib (CALQUENCE) 100 MG capsule Take 100 mg by mouth every 12 (twelve) hours.    acetaminophen (TYLENOL) 500 MG tablet Take 1,000 mg by mouth every 6 (six) hours as needed for mild pain.   albuterol (VENTOLIN HFA) 108 (90 Base) MCG/ACT inhaler Inhale 1-2 puffs into the lungs every 6 (six) hours as needed for wheezing or shortness of breath.   aspirin-acetaminophen-caffeine (EXCEDRIN EXTRA STRENGTH) 250-250-65 MG tablet Take 2 tablets by mouth every 6 (six) hours as needed for headache.   diphenhydrAMINE (BENADRYL) 25 MG tablet Take 1 tablet (25 mg total) by mouth every 6 (six) hours as needed for itching.   EPINEPHrine 0.3 mg/0.3 mL IJ SOAJ injection Inject 0.3 mLs (0.3 mg total) into the muscle as needed for anaphylaxis.   fluticasone furoate-vilanterol (BREO ELLIPTA) 100-25 MCG/INH AEPB Inhale 1 puff into the lungs daily.   hyoscyamine (LEVSIN) 0.125 MG tablet Take 1 tablet (0.125 mg total) by  mouth as needed for cramping. Reported on 09/24/2015   levothyroxine (SYNTHROID) 25 MCG tablet Take 1 tablet (25 mcg total) by mouth daily before breakfast. (Patient taking differently: Take 25 mcg by mouth at bedtime. )   ondansetron (ZOFRAN) 8 MG tablet Take by mouth every 8 (eight) hours as needed for nausea or vomiting.   rizatriptan (MAXALT-MLT) 10 MG disintegrating tablet Take  1 tablet (10 mg total) by mouth as needed for migraine. May repeat in 2 hours if needed   UNABLE TO FIND Take 10 mLs by mouth daily as needed (immune system). Homemade elderberry syrup   UNABLE TO FIND Take 2.4 mg by mouth daily. Med Name: Lithium OTC   valACYclovir (VALTREX) 500 MG tablet Take 500 mg by mouth 2 (two) times daily.   No facility-administered encounter medications on file as of 05/22/2020.    Current Diagnosis: Patient Active Problem List   Diagnosis Date Noted   Fever 06/18/2018   Piriformis syndrome of right side 04/22/2017   Lateral epicondylitis of left elbow 04/06/2017   Solitary pulmonary nodule 10/29/2016   Chronic lymphocytic leukemia ([place]) 07/31/2016   Mass in neck 04/17/2016   Abdominal mass 04/17/2016   Asthma 12/12/2015   Multiple environmental allergies 12/12/2015   Chronic lower back pain 09/12/2015   Bipolar 1 disorder, depressed ([place]) 08/27/2015    Class: Chronic   Other specified hypothyroidism 08/23/2015   Borderline personality disorder ([place]) 08/15/2015   Severe benzodiazepine use disorder ([place]) 08/15/2015   Alcohol use disorder, moderate, dependence ([place]) 08/15/2015   PTSD (post-traumatic stress disorder) 08/15/2015   History of bipolar disorder 08/15/2015   Pre-syncope 05/17/2015   Skull deformity 05/17/2015   Patellofemoral syndrome of both knees 05/02/2015    Goals Addressed   None    Called patient and discussed medications.  No problems at this time.    Stated she did go to Ed on 04-23-20, patient states they did an EKG and waited a long time but no results on chest pain and back pain.  Reports Hematologist stated she needed to go to ED.  States urgent care would not see her. States on 05/21/2020 she got her flu shot.  States she has been only eating eggsand macaroni cheese.  States all the healthy foods are expensive lately.  States she has spoke with Medicaid and Medicare so now she will get extra help with the new  insurance they have now.  Follow-Up:  Pharmacist Review   [place], [facility] [place] Pharmacist Assistant [phone]  Reviewed by: De Blanch, PharmD Clinical Pharmacist [facility] at [facility] [phone]

## 2020-05-22 NOTE — Progress Notes (Signed)
Chronic Care Management Pharmacy Assistant   Name: Morgan [name]  MRN: [id number] DOB: 1965/11/18  Reason for Encounter: Disease State   PCP : Darreld Mclean, MD  Allergies:   Allergies  Allergen Reactions  . Aripiprazole Anaphylaxis and Swelling  . Ciprofloxacin Shortness Of Breath  . Lamotrigine Rash and Other (See Comments)  . Nitrofurantoin Hives  . Other Anaphylaxis, Hives and Swelling    Walnuts and pine nuts  . Peanut Oil Anaphylaxis, Hives, Swelling and Rash  . Amoxicillin Hives and Rash    Has patient had a PCN reaction causing immediate rash, facial/tongue/throat swelling, SOB or lightheadedness with hypotension: Yes Has patient had a PCN reaction causing severe rash involving mucus membranes or skin necrosis: No Has patient had a PCN reaction that required hospitalization: No Has patient had a PCN reaction occurring within the last 10 years: No If all of the above answers are "NO", then may proceed with Cephalosporin use.  Marland Kitchen Doxycycline Hives and Rash  . Latex Rash and Other (See Comments)  . Meloxicam Itching and Other (See Comments)    Induces mania Interacts with Lithium  . Naproxen Hives and Other (See Comments)    Interacts with lithium  . Pramipexole Hives and Rash  . Prednisone Other (See Comments)    Interacts with Lithium  . Risperidone Hives and Rash  . Sulfa Antibiotics Rash    "that leaves scarring"  . Tape Itching and Rash    Steri-StripsT  . Cortisone Other (See Comments)    Exacerbates the mania of her bipolar  . Percocet [Oxycodone-Acetaminophen] Other (See Comments)    Severe dizziness  . Shingrix [Zoster Vac Recomb Adjuvanted] Swelling    rash  . Vancomycin Itching    Itching on head and at IV insertion site where Vanc running    Medications: Outpatient Encounter Medications as of 05/22/2020  Medication Sig  . acalabrutinib (CALQUENCE) 100 MG capsule Take 100 mg by mouth every 12 (twelve) hours.   Marland Kitchen acetaminophen (TYLENOL) 500  MG tablet Take 1,000 mg by mouth every 6 (six) hours as needed for mild pain.  Marland Kitchen albuterol (VENTOLIN HFA) 108 (90 Base) MCG/ACT inhaler Inhale 1-2 puffs into the lungs every 6 (six) hours as needed for wheezing or shortness of breath.  Marland Kitchen aspirin-acetaminophen-caffeine (EXCEDRIN EXTRA STRENGTH) 250-250-65 MG tablet Take 2 tablets by mouth every 6 (six) hours as needed for headache.  . diphenhydrAMINE (BENADRYL) 25 MG tablet Take 1 tablet (25 mg total) by mouth every 6 (six) hours as needed for itching.  Marland Kitchen EPINEPHrine 0.3 mg/0.3 mL IJ SOAJ injection Inject 0.3 mLs (0.3 mg total) into the muscle as needed for anaphylaxis.  . fluticasone furoate-vilanterol (BREO ELLIPTA) 100-25 MCG/INH AEPB Inhale 1 puff into the lungs daily.  . hyoscyamine (LEVSIN) 0.125 MG tablet Take 1 tablet (0.125 mg total) by mouth as needed for cramping. Reported on 09/24/2015  . levothyroxine (SYNTHROID) 25 MCG tablet Take 1 tablet (25 mcg total) by mouth daily before breakfast. (Patient taking differently: Take 25 mcg by mouth at bedtime. )  . ondansetron (ZOFRAN) 8 MG tablet Take by mouth every 8 (eight) hours as needed for nausea or vomiting.  . rizatriptan (MAXALT-MLT) 10 MG disintegrating tablet Take 1 tablet (10 mg total) by mouth as needed for migraine. May repeat in 2 hours if needed  . UNABLE TO FIND Take 10 mLs by mouth daily as needed (immune system). Homemade elderberry syrup  . UNABLE TO FIND Take 2.4 mg by mouth  daily. Med Name: Lithium OTC  . valACYclovir (VALTREX) 500 MG tablet Take 500 mg by mouth 2 (two) times daily.   No facility-administered encounter medications on file as of 05/22/2020.    Current Diagnosis: Patient Active Problem List   Diagnosis Date Noted  . Fever 06/18/2018  . Piriformis syndrome of right side 04/22/2017  . Lateral epicondylitis of left elbow 04/06/2017  . Solitary pulmonary nodule 10/29/2016  . Chronic lymphocytic leukemia ([place]) 07/31/2016  . Mass in neck 04/17/2016  . Abdominal  mass 04/17/2016  . Asthma 12/12/2015  . Multiple environmental allergies 12/12/2015  . Chronic lower back pain 09/12/2015  . Bipolar 1 disorder, depressed ([place]) 08/27/2015    Class: Chronic  . Other specified hypothyroidism 08/23/2015  . Borderline personality disorder ([place]) 08/15/2015  . Severe benzodiazepine use disorder ([place]) 08/15/2015  . Alcohol use disorder, moderate, dependence ([place]) 08/15/2015  . PTSD (post-traumatic stress disorder) 08/15/2015  . History of bipolar disorder 08/15/2015  . Pre-syncope 05/17/2015  . Skull deformity 05/17/2015  . Patellofemoral syndrome of both knees 05/02/2015    Goals Addressed   None    Patient states she has not seen podiatrist.  Reports the podiatrist rating was not good so she would like to find her own within her insurance.  Reports she will keep me updated.   Follow-Up:  Patient Assistance Coordination and Pharmacist Review   [place], [place] [facility] [place] Pharmacist Assistant [phone]

## 2020-05-29 ENCOUNTER — Encounter: Payer: Self-pay | Admitting: General Practice

## 2020-05-29 NOTE — Progress Notes (Signed)
[place] Spiritual Care Note  Received call from [place], providing spiritual and emotional support as she reflected on her self-reported depression, recent acts of self-care, and planned search for a counselor who will feel like a more constructive fit. Encouraged sharing about depression and therapist seach with PCP for ongoing support.   [place], [place], [facility] Pager [phone] Voicemail [phone]

## 2020-06-03 ENCOUNTER — Encounter: Payer: Self-pay | Admitting: General Practice

## 2020-06-04 NOTE — Progress Notes (Signed)
[place] Spiritual Care Note  Received call from [place] to share an epilogue to Friday's conversation, during which she processed new self-awareness and insights that are helping her cope with and make meaning of distress. Provided empathic listening, emotional and spiritual support, and affirmation of strengths.   [place], [place], [facility] Pager [phone] Voicemail [phone]

## 2020-06-10 ENCOUNTER — Encounter: Payer: Self-pay | Admitting: Family Medicine

## 2020-06-10 ENCOUNTER — Encounter: Payer: Self-pay | Admitting: General Practice

## 2020-06-10 NOTE — Progress Notes (Signed)
[place] Spiritual Care Note  Followed up with Morgan [name] by phone per her request. The used the opportunity to share and process the many challenging symptoms she had as a result of the [place] booster, as well as some family concerns. Provided empathic listening and pastoral reflection. We plan to follow up by phone on Wednesday for spiritual/emotional support related to Thanksgiving holiday.   [place], [place], [facility] Pager [phone] Voicemail [phone]

## 2020-06-12 ENCOUNTER — Encounter: Payer: Self-pay | Admitting: General Practice

## 2020-06-12 NOTE — Progress Notes (Signed)
[facility] Spiritual Care Note  Phoned per Tim[place]'s request, but missed her and so left voicemail of care and support, encouraging callback.   [place], [place], [facility] Pager [phone] Voicemail [phone]

## 2020-06-12 NOTE — Progress Notes (Signed)
[place] Spiritual Care Note  Received return call from [place]. She shared recent realizations that she has uncovered while working with a new counselor and processed areas of gratitude in her life. She will reach out again when needed.   [place], [place], [facility] Pager [phone] Voicemail [phone]

## 2020-06-13 NOTE — Progress Notes (Addendum)
[facility] [address], [address] [place], [place] [address] [phone]  Date:  06/17/2020   Name:  Morgan [name]   DOB:  July 23, 1965   MRN:  [id number]  PCP:  Pearline Cables, MD    Chief Complaint: Hip Pain (started last week, right side hip pain, hx of hip pain no known injury, possibly arthritis) and Foot Pain (bilateral foot pain, neuropathy)   History of Present Illness:  Morgan [name] is a 54 y.o. very pleasant female patient who presents with the following:  Pt here today with concern of hip pain- History of CLL, mood disorder, hypothyroidism Last seen by myself in June of this year   Last week she noted right hip pain- the pain was unexpected and severe.  She was able to go for her walk anyway This was the day she got her covid booster but we don't think this was related  The are is now feeling better but has bothered her off and on for the last few years She is not aware of any particular injury to the head.  She notes that the pain will tend to be very sharp or severe 1 day, then get better for period of time  She has neuropathy in her bilateral feet- this may vary with her treatments  Worst in the left 2nd toe  Last week when she was not feeling well her neuropathy actually woke her from sleep It has since improved  She had been on a low dose of levothyroxine but is not longer taking this- will check her TSH today  She also has history of vitamin D deficiency and would like to check her vitamin D level, also lipids today Patient Active Problem List   Diagnosis Date Noted  . Fever 06/18/2018  . Piriformis syndrome of right side 04/22/2017  . Lateral epicondylitis of left elbow 04/06/2017  . Solitary pulmonary nodule 10/29/2016  . Chronic lymphocytic leukemia (HCC) 07/31/2016  . Mass in neck 04/17/2016  . Abdominal mass 04/17/2016  . Asthma 12/12/2015  . Multiple environmental allergies 12/12/2015  .  Chronic lower back pain 09/12/2015  . Bipolar 1 disorder, depressed (HCC) 08/27/2015    Class: Chronic  . Other specified hypothyroidism 08/23/2015  . Borderline personality disorder (HCC) 08/15/2015  . Severe benzodiazepine use disorder (HCC) 08/15/2015  . Alcohol use disorder, moderate, dependence (HCC) 08/15/2015  . PTSD (post-traumatic stress disorder) 08/15/2015  . History of bipolar disorder 08/15/2015  . Pre-syncope 05/17/2015  . Skull deformity 05/17/2015  . Patellofemoral syndrome of both knees 05/02/2015    Past Medical History:  Diagnosis Date  . Anxiety   . Arthritis   . Asthma   . Bipolar disorder (HCC)   . Bulging lumbar disc 07/19/05  . Chronic headaches   . Chronic lymphocytic leukemia (HCC) 07/31/2016  . Colon polyps   . Degenerative disorder of bone   . Depression   . History of borderline personality disorder   . Hypothyroidism 2007   Developed after use of Lithium  . IBS (irritable bowel syndrome) 1995  . Pneumonia   . Proctitis   . Shingles 07/2018  . Substance abuse (HCC)    ativan last month    Past Surgical History:  Procedure Laterality Date  . ABDOMINAL HYSTERECTOMY    . APPENDECTOMY    . BUNIONECTOMY Right 06/2012  . CHOLECYSTECTOMY    . OOPHORECTOMY    . SHOULDER OPEN ROTATOR CUFF REPAIR  Right 03/2010  . TUBAL LIGATION      Social History   Tobacco Use  . Smoking status: Former Smoker    Quit date: 07/20/1980    Years since quitting: 39.9  . Smokeless tobacco: Never Used  Vaping Use  . Vaping Use: Never used  Substance Use Topics  . Alcohol use: Yes    Alcohol/week: 0.0 standard drinks    Comment: occ  . Drug use: Not Currently    Family History  Problem Relation Age of Onset  . Depression Mother   . Hypertension Mother   . Post-traumatic stress disorder Sister   . Alcohol abuse Brother   . Drug abuse Brother   . Post-traumatic stress disorder Brother   . Thyroid disease Father   . Alzheimer's disease Father   . COPD  Maternal Grandmother   . Heart disease Maternal Grandfather   . Arthritis Paternal Grandmother   . Diabetes Paternal Grandmother   . Depression Paternal Grandmother   . Alzheimer's disease Paternal Grandmother   . Heart disease Paternal Grandfather   . Coronary artery disease Paternal Grandfather   . Alcohol abuse Paternal Grandfather   . Colon cancer Neg Hx   . Breast cancer Neg Hx     Allergies  Allergen Reactions  . Aripiprazole Anaphylaxis and Swelling  . Ciprofloxacin Shortness Of Breath  . Lamotrigine Rash and Other (See Comments)  . Nitrofurantoin Hives  . Other Anaphylaxis, Hives and Swelling    Walnuts and pine nuts  . Peanut Oil Anaphylaxis, Hives, Swelling and Rash  . Amoxicillin Hives and Rash    Has patient had a PCN reaction causing immediate rash, facial/tongue/throat swelling, SOB or lightheadedness with hypotension: Yes Has patient had a PCN reaction causing severe rash involving mucus membranes or skin necrosis: No Has patient had a PCN reaction that required hospitalization: No Has patient had a PCN reaction occurring within the last 10 years: No If all of the above answers are "NO", then may proceed with Cephalosporin use.  Marland Kitchen Doxycycline Hives and Rash  . Latex Rash and Other (See Comments)  . Meloxicam Itching and Other (See Comments)    Induces mania Interacts with Lithium  . Naproxen Hives and Other (See Comments)    Interacts with lithium  . Pramipexole Hives and Rash  . Prednisone Other (See Comments)    Interacts with Lithium  . Risperidone Hives and Rash  . Sulfa Antibiotics Rash    "that leaves scarring"  . Tape Itching and Rash    Steri-StripsT  . Cortisone Other (See Comments)    Exacerbates the mania of her bipolar  . Percocet [Oxycodone-Acetaminophen] Other (See Comments)    Severe dizziness  . Shingrix [Zoster Vac Recomb Adjuvanted] Swelling    rash  . Vancomycin Itching    Itching on head and at IV insertion site where Vanc running     Medication list has been reviewed and updated.  Current Outpatient Medications on File Prior to Visit  Medication Sig Dispense Refill  . acalabrutinib (CALQUENCE) 100 MG capsule Take 100 mg by mouth every 12 (twelve) hours.     Marland Kitchen acetaminophen (TYLENOL) 500 MG tablet Take 1,000 mg by mouth every 6 (six) hours as needed for mild pain.    Marland Kitchen albuterol (VENTOLIN HFA) 108 (90 Base) MCG/ACT inhaler Inhale 1-2 puffs into the lungs every 6 (six) hours as needed for wheezing or shortness of breath. 18 g 6  . aspirin-acetaminophen-caffeine (EXCEDRIN EXTRA STRENGTH) 250-250-65 MG tablet Take 2  tablets by mouth every 6 (six) hours as needed for headache.    . diphenhydrAMINE (BENADRYL) 25 MG tablet Take 1 tablet (25 mg total) by mouth every 6 (six) hours as needed for itching. 20 tablet 0  . EPINEPHrine 0.3 mg/0.3 mL IJ SOAJ injection Inject 0.3 mLs (0.3 mg total) into the muscle as needed for anaphylaxis. 1 each prn  . fluticasone furoate-vilanterol (BREO ELLIPTA) 100-25 MCG/INH AEPB Inhale 1 puff into the lungs daily. 1 each 11  . hyoscyamine (LEVSIN) 0.125 MG tablet Take 1 tablet (0.125 mg total) by mouth as needed for cramping. Reported on 09/24/2015 10 tablet 2  . levothyroxine (SYNTHROID) 25 MCG tablet Take 1 tablet (25 mcg total) by mouth daily before breakfast. (Patient taking differently: Take 25 mcg by mouth at bedtime. ) 30 tablet 2  . ondansetron (ZOFRAN) 8 MG tablet Take by mouth every 8 (eight) hours as needed for nausea or vomiting.    . rizatriptan (MAXALT-MLT) 10 MG disintegrating tablet Take 1 tablet (10 mg total) by mouth as needed for migraine. May repeat in 2 hours if needed 9 tablet 11  . UNABLE TO FIND Take 10 mLs by mouth daily as needed (immune system). Homemade elderberry syrup    . UNABLE TO FIND Take 2.4 mg by mouth daily. Med Name: Lithium OTC     No current facility-administered medications on file prior to visit.    Review of Systems:  As per HPI- otherwise  negative.   Physical Examination: Vitals:   06/17/20 1053  BP: 122/74  Pulse: 84  Resp: 16  SpO2: 97%   Vitals:   06/17/20 1053  Weight: 185 lb (83.9 kg)  Height: 5' 3.5" (1.613 m)   Body mass index is 32.26 kg/m. Ideal Body Weight: Weight in (lb) to have BMI = 25: 143.1  GEN: no acute distress. Looks well and her normal self  HEENT: Atraumatic, Normocephalic.  Ears and Nose: No external deformity. CV: RRR, No M/G/R. No JVD. No thrill. No extra heart sounds. PULM: CTA B, no wheezes, crackles, rhonchi. No retractions. No resp. distress. No accessory muscle use. ABD: S, NT, ND, +BS. No rebound. No HSM. EXTR: No c/c/e PSYCH: Normally interactive. Conversant.  Normal range of motion of right hip.  No tenderness to hip flexion/flexed abduction or abduction Foot exam normal today  She does have a corn on the left great toe, status post what appears to be bunion surgery bilaterally Assessment and Plan: Right hip pain - Plan: CANCELED: DG HIP UNILAT W OR W/O PELVIS 2-3 VIEWS RIGHT  Acquired hypothyroidism - Plan: TSH  Vitamin D deficiency - Plan: VITAMIN D 25 Hydroxy (Vit-D Deficiency, Fractures)  Screening for hyperlipidemia - Plan: Lipid panel  Left hip pain - Plan: CANCELED: DG HIP UNILAT W OR W/O PELVIS 2-3 VIEWS LEFT  Patient is here today with pain in her right hip which occurred a few days ago.  Her hip now feels better, she would like to get x-rays to determine if any significant arthritis.  She also notes occasional pain in the left hip as well, will obtain bilateral x-rays and be in touch with these results Encourage her to continue exercise program  We will check vitamin D, lipids today  She has stopped taking her levothyroxine.  Check TSH, can restart medication if needed This visit occurred during the SARS-CoV-2 public health emergency.  Safety protocols were in place, including screening questions prior to the visit, additional usage of staff PPE, and extensive  cleaning of exam room while observing appropriate contact time as indicated for disinfecting solutions.    Signed Abbe Amsterdam, MD   Received her x-rays as below, message to patient DG HIPS BILAT WITH PELVIS MIN 5 VIEWS  Result Date: 06/17/2020 CLINICAL DATA:  Bilateral hip pain for a few months. No known injury. EXAM: DG HIP (WITH OR WITHOUT PELVIS) 5+V BILAT COMPARISON:  CT abdomen and pelvis 03/23/2019. FINDINGS: There is no evidence of hip fracture or dislocation. There is no evidence of arthropathy or other focal bone abnormality. IMPRESSION: Negative exam. Electronically Signed   By: Drusilla Kanner M.D.   On: 06/17/2020 11:51   Received her as below.  Message to patient  Results for orders placed or performed in visit on 06/17/20  TSH  Result Value Ref Range   TSH 4.65 (H) 0.35 - 4.50 uIU/mL  VITAMIN D 25 Hydroxy (Vit-D Deficiency, Fractures)  Result Value Ref Range   VITD 25.73 (L) 30.00 - 100.00 ng/mL  Lipid panel  Result Value Ref Range   Cholesterol 229 (H) 0 - 200 mg/dL   Triglycerides 409.8 (H) 0 - 149 mg/dL   HDL 11.91 >47.82 mg/dL   VLDL 95.6 (H) 0.0 - 21.3 mg/dL   Total CHOL/HDL Ratio 3    NonHDL 155.93   LDL cholesterol, direct  Result Value Ref Range   Direct LDL 119.0 mg/dL

## 2020-06-13 NOTE — Patient Instructions (Addendum)
Good to see you again today!  I will be in touch with your labs and your hip films asap  Please see me in 4-6 months to check on how you are doing

## 2020-06-17 ENCOUNTER — Ambulatory Visit (INDEPENDENT_AMBULATORY_CARE_PROVIDER_SITE_OTHER): Payer: Medicare Other | Admitting: Family Medicine

## 2020-06-17 ENCOUNTER — Ambulatory Visit (HOSPITAL_BASED_OUTPATIENT_CLINIC_OR_DEPARTMENT_OTHER)
Admission: RE | Admit: 2020-06-17 | Discharge: 2020-06-17 | Disposition: A | Discharge status: Home / Self Care | Payer: Medicare Other | Source: Ambulatory Visit | Attending: Family Medicine | Admitting: Family Medicine

## 2020-06-17 ENCOUNTER — Encounter: Payer: Self-pay | Admitting: Family Medicine

## 2020-06-17 ENCOUNTER — Other Ambulatory Visit: Payer: Self-pay

## 2020-06-17 ENCOUNTER — Other Ambulatory Visit: Payer: Self-pay | Admitting: Family Medicine

## 2020-06-17 VITALS — BP 122/74 | HR 84 | Resp 16 | Ht 63.5 in | Wt 185.0 lb

## 2020-06-17 DIAGNOSIS — M25552 Pain in left hip: Secondary | ICD-10-CM

## 2020-06-17 DIAGNOSIS — M25551 Pain in right hip: Secondary | ICD-10-CM | POA: Diagnosis not present

## 2020-06-17 DIAGNOSIS — E039 Hypothyroidism, unspecified: Secondary | ICD-10-CM | POA: Diagnosis not present

## 2020-06-17 DIAGNOSIS — Z13220 Encounter for screening for lipoid disorders: Secondary | ICD-10-CM

## 2020-06-17 DIAGNOSIS — E559 Vitamin D deficiency, unspecified: Secondary | ICD-10-CM

## 2020-06-17 LAB — LIPID PANEL
Cholesterol: 229 mg/dL — ABNORMAL HIGH (ref 0–200)
HDL: 73.1 mg/dL (ref >39.00)
NonHDL: 155.93
Total CHOL/HDL Ratio: 3
Triglycerides: 240 mg/dL — ABNORMAL HIGH (ref 0.0–149.0)
VLDL: 48 mg/dL — ABNORMAL HIGH (ref 0.0–40.0)

## 2020-06-17 LAB — VITAMIN D 25 HYDROXY (VIT D DEFICIENCY, FRACTURES): VITD: 25.73 ng/mL — ABNORMAL LOW (ref 30.00–100.00)

## 2020-06-17 LAB — LDL CHOLESTEROL, DIRECT: Direct LDL: 119 mg/dL

## 2020-06-17 LAB — TSH: TSH: 4.65 u[IU]/mL — ABNORMAL HIGH (ref 0.35–4.50)

## 2020-06-18 ENCOUNTER — Encounter: Payer: Self-pay | Admitting: Family Medicine

## 2020-06-18 ENCOUNTER — Encounter: Payer: Self-pay | Admitting: General Practice

## 2020-06-18 NOTE — Progress Notes (Signed)
[place] Spiritual Care Note  Received call from [facility] seeking assistance with processing her feelings about recent lab results and creating a list of her questions to ask her PCP and oncologist. Served as empathic listener and conversation/discernment partner, providing prayer per request. One perspective that Ceaira found particularly helpful was to allow her doctors  to help her come up with a strategy for addressing them, rather than to sit in worry about results and unknowns herself.   [place], [place], [facility] Pager [phone] Voicemail [phone]

## 2020-06-21 ENCOUNTER — Encounter: Payer: Self-pay | Admitting: General Practice

## 2020-06-21 NOTE — Progress Notes (Signed)
[place] Spiritual Care Note  Returned call from [place] requesting spiritual and emotional support regarding a triggering incident she experienced yesterday. She had already utilized her other resources, including her therapist's office and [facility]. Provided empathic listening, pastoral reflection, affirmation of strengths, and prayer per request. By the end of the call, [place] had identified support resources including friends who are available over the weekend and thought of ways for reframing and reflecting theologically on her experience, which is very important to her. She has a counseling appointment next week and reaches out to chaplain regularly as needed for an additional layer of support.   [place], [place], [facility] Pager [phone] Voicemail [phone]

## 2020-06-25 DIAGNOSIS — Z959 Presence of cardiac and vascular implant and graft, unspecified: Secondary | ICD-10-CM | POA: Diagnosis not present

## 2020-06-28 DIAGNOSIS — C9110 Chronic lymphocytic leukemia of B-cell type not having achieved remission: Secondary | ICD-10-CM | POA: Diagnosis not present

## 2020-06-28 DIAGNOSIS — D801 Nonfamilial hypogammaglobulinemia: Secondary | ICD-10-CM | POA: Diagnosis not present

## 2020-07-08 DIAGNOSIS — Z959 Presence of cardiac and vascular implant and graft, unspecified: Secondary | ICD-10-CM | POA: Diagnosis not present

## 2020-07-16 ENCOUNTER — Encounter: Payer: Self-pay | Admitting: General Practice

## 2020-07-16 NOTE — Progress Notes (Signed)
[facility] Spiritual Care Note  Received call from [facility] to process her discouragement about treatment/side effects and discernment about how to proceed as a tool for preparing to speak in more detail with one of her care providers [facility]. Provided reflective listening to help with clarifying her perspective and questions. She plans to reach out again as needed.   [address] [place], [place], [facility] Pager [phone] Voicemail [phone]

## 2020-07-18 ENCOUNTER — Telehealth: Payer: Self-pay | Admitting: Diagnostic Neuroimaging

## 2020-07-18 ENCOUNTER — Telehealth: Payer: Self-pay | Admitting: Family Medicine

## 2020-07-18 DIAGNOSIS — J0100 Acute maxillary sinusitis, unspecified: Secondary | ICD-10-CM

## 2020-07-18 MED ORDER — CEFDINIR 300 MG PO CAPS: 300.0000 mg | ORAL_CAPSULE | Freq: Two times a day (BID) | ORAL | 0 refills | Status: DC

## 2020-07-18 NOTE — Telephone Encounter (Signed)
I returned the call to the patient. She is agreeable to this plan. She has scheduled a follow up with Shawnie Dapper, NP for her worsening migraines.

## 2020-07-18 NOTE — Telephone Encounter (Signed)
Pt. Called today stating that she has had a migraine and she took her  Medication to help with it and it did not help. Pt is unsure what to do. Best call back is [phone]

## 2020-07-18 NOTE — Telephone Encounter (Signed)
Caller: Brooksie Call Back # [phone]  Patient called in reference to possible sinus infection she has. Patient is requesting a call back from Dr [employer] Nurse. Patient states she called her neurologist and they asked her to call us.

## 2020-07-18 NOTE — Telephone Encounter (Signed)
She is on the second day of a significant migraine. She has tried rizatriptan three times in the last 24 hours with only temporary relief. Additionally, she tried Tylenol twice.   I ask about other symptoms and she reports the following: dry cough for the last week that is now productive, congestion, sinus pressure, nausea, vomiting, fatigue.  She took a home COVID-19 last night and it was negative.

## 2020-07-18 NOTE — Telephone Encounter (Signed)
Called patient she states she is in [place] out of town-she flew there. She woke up with a migraine, took her migraine medication but it did not work. She called her neurologist who then told her to call us for advice. She has had a bad headache, vomiting-she is having trouble functioning as she states. This morning she noticed she has been congested, blowing her nose often. She states she feels like she has a slight fever.   She was tested for covid yesterday and tested negative. She is flying back home Wednesday and is wanting advise on what can be done and she is immunocompromised.   Her pharmacy in [place] is [facility] [address] [place], [place], [place] [address]. Phone : [phone]

## 2020-07-18 NOTE — Telephone Encounter (Signed)
Called her back- she is in [place] visiting her family She got sick and was concerned about what to do She had a migraine headache all day yesterday.  She tried her migraine meds but did not seem to help  She spoke to her neurologist who recommended that she talk to me  She notes a cough, mucus in her sinuses-this is developed over the last 1 to 2 days No fever noted She did take a covid 19 test - yesterday- self test rapid.  It was negative  She has not been able to find a PCR test  Discussed with patient in detail.  It is still possible she has COVID-19, I encouraged her to try to find a PCR test if she is able, although we understand this may be impossible  We will treat with antibiotics for any bacterial infection.  This is somewhat complicated by patient's allergy list.  We had plan to use Biaxin but this interferes with one of her current cancer medications.  Decided to use Omnicef, she has history of penicillin allergy but has tolerated cephalosporins on many occasions in the past.  She took [place] on 2 occasions in 2019 without incident.  I advised her to be vigilant for any sign of allergic reaction, if any itching or rash stop antibiotic immediately, let me know and use Benadryl as needed Meds ordered this encounter  Medications   cefdinir (OMNICEF) 300 MG capsule    Sig: Take 1 capsule (300 mg total) by mouth 2 (two) times daily for 10 days.    Dispense:  20 capsule    Refill:  0

## 2020-07-18 NOTE — Telephone Encounter (Signed)
Please advise patient to continue with over-the-counter medication for migraine relief.  If she has a severe, unrelenting headache she may have to go to the emergency room.  She has not had an appointment in this clinic for nearly a year.  Please also assist with making a follow-up appointment with her primary neurologist or nurse practitioner.

## 2020-07-26 ENCOUNTER — Encounter: Payer: Self-pay | Admitting: General Practice

## 2020-07-27 DIAGNOSIS — Z20822 Contact with and (suspected) exposure to covid-19: Secondary | ICD-10-CM | POA: Diagnosis not present

## 2020-07-29 NOTE — Progress Notes (Signed)
[facility] Spiritual Care Note  Provided empathic listening, emotional support, pastoral reflection, and affirmation of strengths by phone regarding treatment and other stressors. [place] reaches out regularly as needed.  [place], [place], [facility] Pager [phone] Voicemail [phone]

## 2020-07-30 NOTE — Progress Notes (Addendum)
[place] [facility] [address], [place], [place] [address] [phone] [phone]  Date:  08/01/2020   Name:  Morgan [name]   DOB:  [birth year]   MRN:  [id number]  PCP:  Darreld Mclean, MD    Chief Complaint: No chief complaint on file.   History of Present Illness:  Morgan [name] is a 55 y.o. very pleasant female patient who presents with the following:  Virtual visit today for concern of illness Pt location is home, provider location is office Pt identity confirmed with 2 factors, she gives consent for virtual visit today- however, on further review we end up having her come in for an in person evaluation   Pt with history of CLL, mood disorder, hypothyroidism Last seen by myself in November  Lab Results  Component Value Date   TSH 4.65 (H) 06/17/2020   Today pt has concern of illness  She has noted temp up to 99.9, and down to 96's.  She was tested for covid last weekend on 1/8- negative, she did this just for travel- she had been to [place] to see her son   Her sx actually started the next day- this past Sunday 1/9 She noted ST, nausea but no vomiting, no headache On Monday 1/10 she had a worse ST On Tuesday she noted chest congestion- this is now better  She did have a cough at the start of the week but she notes that this is better. No cough this am   She has noted intermittent pain in her RUQ area- where she had shingles in the past  She had this last night as well- she notes it feels like something is "twising in my liver" and different than pain she had in the past.  She is s/p chole  She is getting tested for covid again next week- however we ended up just testing her today   Currently this am she notes that her throat is red but does not really hurt  She did not check her temp today but does not feel like she has a temperature   She also has noted a sore feeling in her chest, intermittent, more on the right Non  exertional    Patient Active Problem List   Diagnosis Date Noted  . Fever 06/18/2018  . Piriformis syndrome of right side 04/22/2017  . Lateral epicondylitis of left elbow 04/06/2017  . Solitary pulmonary nodule 10/29/2016  . Chronic lymphocytic leukemia ([place]) 07/31/2016  . Mass in neck 04/17/2016  . Abdominal mass 04/17/2016  . Asthma 12/12/2015  . Multiple environmental allergies 12/12/2015  . Chronic lower back pain 09/12/2015  . Bipolar 1 disorder, depressed ([place]) 08/27/2015    Class: Chronic  . Other specified hypothyroidism 08/23/2015  . Borderline personality disorder ([place]) 08/15/2015  . Severe benzodiazepine use disorder ([place]) 08/15/2015  . Alcohol use disorder, moderate, dependence ([place]) 08/15/2015  . PTSD (post-traumatic stress disorder) 08/15/2015  . History of bipolar disorder 08/15/2015  . Pre-syncope 05/17/2015  . Skull deformity 05/17/2015  . Patellofemoral syndrome of both knees 05/02/2015    Past Medical History:  Diagnosis Date  . Anxiety   . Arthritis   . Asthma   . Bipolar disorder ([place])   . Bulging lumbar disc 07/19/05  . Chronic headaches   . Chronic lymphocytic leukemia ([place]) 07/31/2016  . Colon polyps   . Degenerative disorder of bone   . Depression   . History of borderline  personality disorder   . Hypothyroidism 2007   Developed after use of Lithium  . IBS (irritable bowel syndrome) 1995  . Pneumonia   . Proctitis   . Shingles 07/2018  . Substance abuse ([place])    ativan last month    Past Surgical History:  Procedure Laterality Date  . ABDOMINAL HYSTERECTOMY    . APPENDECTOMY    . BUNIONECTOMY Right 06/2012  . CHOLECYSTECTOMY    . OOPHORECTOMY    . SHOULDER OPEN ROTATOR CUFF REPAIR Right 03/2010  . TUBAL LIGATION      Social History   Tobacco Use  . Smoking status: Former Smoker    Quit date: 07/20/1980    Years since quitting: 40.0  . Smokeless tobacco: Never Used  Vaping Use  . Vaping Use: Never used  Substance Use Topics   . Alcohol use: Yes    Alcohol/week: 0.0 standard drinks    Comment: occ  . Drug use: Not Currently    Family History  Problem Relation Age of Onset  . Depression Mother   . Hypertension Mother   . Post-traumatic stress disorder Sister   . Alcohol abuse Brother   . Drug abuse Brother   . Post-traumatic stress disorder Brother   . Thyroid disease Father   . Alzheimer's disease Father   . COPD Maternal Grandmother   . Heart disease Maternal Grandfather   . Arthritis Paternal Grandmother   . Diabetes Paternal Grandmother   . Depression Paternal Grandmother   . Alzheimer's disease Paternal Grandmother   . Heart disease Paternal Grandfather   . Coronary artery disease Paternal Grandfather   . Alcohol abuse Paternal Grandfather   . Colon cancer Neg Hx   . Breast cancer Neg Hx     Allergies  Allergen Reactions  . Aripiprazole Anaphylaxis and Swelling  . Ciprofloxacin Shortness Of Breath  . Lamotrigine Rash and Other (See Comments)  . Nitrofurantoin Hives  . Other Anaphylaxis, Hives and Swelling    Walnuts and pine nuts  . Peanut Oil Anaphylaxis, Hives, Swelling and Rash  . Amoxicillin Hives and Rash    Has patient had a PCN reaction causing immediate rash, facial/tongue/throat swelling, SOB or lightheadedness with hypotension: Yes Has patient had a PCN reaction causing severe rash involving mucus membranes or skin necrosis: No Has patient had a PCN reaction that required hospitalization: No Has patient had a PCN reaction occurring within the last 10 years: No If all of the above answers are "NO", then may proceed with Cephalosporin use.  Marland Kitchen Doxycycline Hives and Rash  . Latex Rash and Other (See Comments)  . Meloxicam Itching and Other (See Comments)    Induces mania Interacts with Lithium  . Naproxen Hives and Other (See Comments)    Interacts with lithium  . Pramipexole Hives and Rash  . Prednisone Other (See Comments)    Interacts with Lithium  . Risperidone Hives  and Rash  . Sulfa Antibiotics Rash    "that leaves scarring"  . Tape Itching and Rash    Steri-StripsT  . Cortisone Other (See Comments)    Exacerbates the mania of her bipolar  . Percocet [Oxycodone-Acetaminophen] Other (See Comments)    Severe dizziness  . Shingrix [Zoster Vac Recomb Adjuvanted] Swelling    rash  . Vancomycin Itching    Itching on head and at IV insertion site where Vanc running    Medication list has been reviewed and updated.  Current Outpatient Medications on File Prior to Visit  Medication  Sig Dispense Refill  . acalabrutinib (CALQUENCE) 100 MG capsule Take 100 mg by mouth every 12 (twelve) hours.     Marland Kitchen acetaminophen (TYLENOL) 500 MG tablet Take 1,000 mg by mouth every 6 (six) hours as needed for mild pain.    Marland Kitchen albuterol (VENTOLIN HFA) 108 (90 Base) MCG/ACT inhaler Inhale 1-2 puffs into the lungs every 6 (six) hours as needed for wheezing or shortness of breath. 18 g 6  . aspirin-acetaminophen-caffeine (EXCEDRIN EXTRA STRENGTH) 250-250-65 MG tablet Take 2 tablets by mouth every 6 (six) hours as needed for headache.    . diphenhydrAMINE (BENADRYL) 25 MG tablet Take 1 tablet (25 mg total) by mouth every 6 (six) hours as needed for itching. 20 tablet 0  . EPINEPHrine 0.3 mg/0.3 mL IJ SOAJ injection Inject 0.3 mLs (0.3 mg total) into the muscle as needed for anaphylaxis. 1 each prn  . fluticasone furoate-vilanterol (BREO ELLIPTA) 100-25 MCG/INH AEPB Inhale 1 puff into the lungs daily. 1 each 11  . hyoscyamine (LEVSIN) 0.125 MG tablet Take 1 tablet (0.125 mg total) by mouth as needed for cramping. Reported on 09/24/2015 10 tablet 2  . ondansetron (ZOFRAN) 8 MG tablet Take by mouth every 8 (eight) hours as needed for nausea or vomiting.    . rizatriptan (MAXALT-MLT) 10 MG disintegrating tablet Take 1 tablet (10 mg total) by mouth as needed for migraine. May repeat in 2 hours if needed 9 tablet 11  . UNABLE TO FIND Take 10 mLs by mouth daily as needed (immune system).  Homemade elderberry syrup    . UNABLE TO FIND Take 2.4 mg by mouth daily. Med Name: Lithium OTC     No current facility-administered medications on file prior to visit.    Review of Systems:  As per HPI- otherwise negative.   Physical Examination: Vitals:   08/01/20 1521  BP: 122/76  Pulse: 86  Resp: 18  Temp: 98.2 F (36.8 C)  SpO2: 99%   There were no vitals filed for this visit. There is no height or weight on file to calculate BMI. Ideal Body Weight:    GEN: no acute distress.  Pt looks her normal self today  HEENT: Atraumatic, Normocephalic.   Bilateral TM wnl, oropharynx normal.  PEERL,EOMI.   Ears and Nose: No external deformity. CV: RRR, No M/G/R. No JVD. No thrill. No extra heart sounds. I am able to reproduce her chest pain by pressing on her chest wall  PULM: CTA B, no wheezes, crackles, rhonchi. No retractions. No resp. distress. No accessory muscle use. ABD: S, NT, ND, +BS. No rebound. No HSM. EXTR: No c/c/e PSYCH: Normally interactive. Conversant.  She notes mild reproducible RUQ pain (s/p chole)  No redness or rash to suggest recurrent zoster   EKG: NSR, no ST elevation or depression  Compared with EKG from 04/22/20 no significant change noted   Assessment and Plan: Chest pain, unspecified type - Plan: EKG 12-Lead, CBC, Comprehensive metabolic panel, Troponin I, Lipase, CANCELED: Troponin I (High Sensitivity)  Acquired hypothyroidism - Plan: levothyroxine (SYNTHROID) 25 MCG tablet, TSH  Cough - Plan: Novel Coronavirus, NAA ([facility]), CANCELED: SARS-COV-2 RNA,(COVID-19) QUAL NAAT  Pt seen today for illness- possible covid 19. Tested today She also has concern of vague, reproducible CP EKG reassuring, troponin pending.  Assuming this is normal suspect not cardiac CP Labs are pending as above for RUQ pain Will plan further follow- up pending labs. Pt is offered ER for further evaluation she declines today, will seek care if  getting worse or not doing  ok  This visit occurred during the SARS-CoV-2 public health emergency.  Safety protocols were in place, including screening questions prior to the visit, additional usage of staff PPE, and extensive cleaning of exam room while observing appropriate contact time as indicated for disinfecting solutions.     Signed Lamar Blinks, MD  Received troponin- normal. Message to pt  Results for orders placed or performed in visit on 08/01/20  Troponin I  Result Value Ref Range   Troponin I <3 < OR = 47 ng/L   Received her other labs 1/14 Results for orders placed or performed in visit on 08/01/20  CBC  Result Value Ref Range   WBC 3.9 (L) 4.0 - 10.5 K/uL   RBC 5.50 (H) 3.87 - 5.11 Mil/uL   Platelets 211.0 150.0 - 400.0 K/uL   Hemoglobin 15.1 (H) 12.0 - 15.0 g/dL   HCT 45.9 36.0 - 46.0 %   MCV 83.6 78.0 - 100.0 fl   MCHC 33.0 30.0 - 36.0 g/dL   RDW 13.8 11.5 - 15.5 %  Comprehensive metabolic panel  Result Value Ref Range   Sodium 139 135 - 145 mEq/L   Potassium 4.2 3.5 - 5.1 mEq/L   Chloride 104 96 - 112 mEq/L   CO2 26 19 - 32 mEq/L   Glucose, Bld 93 70 - 99 mg/dL   BUN 10 6 - 23 mg/dL   Creatinine, Ser 0.87 0.40 - 1.20 mg/dL   Total Bilirubin 0.4 0.2 - 1.2 mg/dL   Alkaline Phosphatase 67 39 - 117 U/L   AST 13 0 - 37 U/L   ALT 10 0 - 35 U/L   Total Protein 6.9 6.0 - 8.3 g/dL   Albumin 4.7 3.5 - 5.2 g/dL   GFR 74.91 >60.00 mL/min   Calcium 10.3 8.4 - 10.5 mg/dL  TSH  Result Value Ref Range   TSH 2.84 0.35 - 4.50 uIU/mL  Troponin I  Result Value Ref Range   Troponin I <3 < OR = 47 ng/L  Lipase  Result Value Ref Range   Lipase 79.0 (H) 11.0 - 59.0 U/L   Message to pt

## 2020-08-01 ENCOUNTER — Encounter: Payer: Self-pay | Admitting: Family Medicine

## 2020-08-01 ENCOUNTER — Other Ambulatory Visit: Payer: Self-pay

## 2020-08-01 ENCOUNTER — Telehealth: Payer: Medicare Other | Admitting: Family Medicine

## 2020-08-01 VITALS — BP 122/76 | HR 86 | Temp 98.2°F | Resp 18

## 2020-08-01 DIAGNOSIS — R059 Cough, unspecified: Secondary | ICD-10-CM | POA: Diagnosis not present

## 2020-08-01 DIAGNOSIS — R079 Chest pain, unspecified: Secondary | ICD-10-CM

## 2020-08-01 DIAGNOSIS — E039 Hypothyroidism, unspecified: Secondary | ICD-10-CM

## 2020-08-01 LAB — TROPONIN I: Troponin I: <3 ng/L (ref <=47)

## 2020-08-01 MED ORDER — LEVOTHYROXINE SODIUM 25 MCG PO TABS: 25.0000 ug | ORAL_TABLET | Freq: Every day | ORAL | 1 refills | Status: DC

## 2020-08-01 NOTE — Patient Instructions (Signed)
Good to se you today- I am sorry you are sick!  I will be in touch with your labs asap  We will test for covid and also check on your blood counts and liver enzymes  Please let me know if you are getting worse or have any other concerns in the meantime

## 2020-08-02 ENCOUNTER — Encounter: Payer: Self-pay | Admitting: General Practice

## 2020-08-02 ENCOUNTER — Encounter: Payer: Self-pay | Admitting: Family Medicine

## 2020-08-02 LAB — COMPREHENSIVE METABOLIC PANEL
ALT: 10 U/L (ref 0–35)
AST: 13 U/L (ref 0–37)
Albumin: 4.7 g/dL (ref 3.5–5.2)
Alkaline Phosphatase: 67 U/L (ref 39–117)
BUN: 10 mg/dL (ref 6–23)
CO2: 26 mEq/L (ref 19–32)
Calcium: 10.3 mg/dL (ref 8.4–10.5)
Chloride: 104 mEq/L (ref 96–112)
Creatinine, Ser: 0.87 mg/dL (ref 0.40–1.20)
GFR: 74.91 mL/min (ref >60.00)
Glucose, Bld: 93 mg/dL (ref 70–99)
Potassium: 4.2 mEq/L (ref 3.5–5.1)
Sodium: 139 mEq/L (ref 135–145)
Total Bilirubin: 0.4 mg/dL (ref 0.2–1.2)
Total Protein: 6.9 g/dL (ref 6.0–8.3)

## 2020-08-02 LAB — LIPASE: Lipase: 79 U/L — ABNORMAL HIGH (ref 11.0–59.0)

## 2020-08-02 LAB — TSH: TSH: 2.84 u[IU]/mL (ref 0.35–4.50)

## 2020-08-02 LAB — CBC
HCT: 45.9 % (ref 36.0–46.0)
Hemoglobin: 15.1 g/dL — ABNORMAL HIGH (ref 12.0–15.0)
MCHC: 33 g/dL (ref 30.0–36.0)
MCV: 83.6 fl (ref 78.0–100.0)
Platelets: 211 10*3/uL (ref 150.0–400.0)
RBC: 5.5 Mil/uL — ABNORMAL HIGH (ref 3.87–5.11)
RDW: 13.8 % (ref 11.5–15.5)
WBC: 3.9 10*3/uL — ABNORMAL LOW (ref 4.0–10.5)

## 2020-08-02 NOTE — Progress Notes (Signed)
[facility] Spiritual Care Note  Had a Spiritual Care Virtual Visit with Morgan [name] via Webex to make space for her to share and process health concerns. Being gentle with herself and seeking joy are two themes that she is working on to support meaning and personal growth. She plans to follow up as needed, including participating in "[place] Pathways: [place] for Coping," a new [employer]) series.  [place], [place], [facility] Pager [phone] Voicemail [phone]

## 2020-08-03 LAB — NOVEL CORONAVIRUS, NAA: SARS-CoV-2, NAA: NOT DETECTED

## 2020-08-03 LAB — SARS-COV-2, NAA 2 DAY TAT

## 2020-08-05 NOTE — Chronic Care Management (AMB) (Signed)
Chronic Care Management Pharmacy  Name: Morgan [name]  MRN: [id number] DOB: 1965-11-04  Chief Complaint/ HPI  Morgan [name],  55 y.o. , female presents for their Follow-Up CCM visit with the clinical pharmacist via telephone due to COVID-19 Pandemic.  PCP : Morgan Mclean, MD  Their chronic conditions include: Asthma, Hypothyroidism, Bipolar Disorder, Leukemia, Stomach Cramping, Recurrent Shingles, Migraine, Risk of Anaphylaxis  Office Visits:  06/17/20: Visit with Dr. Lorelei [name] - right hip pain.  01/18/20: Visit w/ Dr. Etter [name] - Neck pain. Labs order. Consider muscle relaxer, but would like to wait. If WBC elevated follow up with oncologist.  01/04/20: Visit w/ Dr. Lorelei [name] - Status post placement of implantable loop recorder. Site healing nicely.  Refills ordered including epinephrine and maxalt  Consult Visit: 04/26/20: [facility] visit w/ Dr. Dorothea [name] - IVIG administered. Pt tolerated treatment well.   ED Visit:  02/15/20: Viral URI - Covid negative  Medications: Outpatient Encounter Medications as of 08/08/2020  Medication Sig  . acalabrutinib (CALQUENCE) 100 MG capsule Take 100 mg by mouth every 12 (twelve) hours.   Marland Kitchen acetaminophen (TYLENOL) 500 MG tablet Take 1,000 mg by mouth every 6 (six) hours as needed for mild pain.  Marland Kitchen albuterol (VENTOLIN HFA) 108 (90 Base) MCG/ACT inhaler Inhale 1-2 puffs into the lungs every 6 (six) hours as needed for wheezing or shortness of breath.  Marland Kitchen aspirin-acetaminophen-caffeine (EXCEDRIN EXTRA STRENGTH) 250-250-65 MG tablet Take 2 tablets by mouth every 6 (six) hours as needed for headache.  . diphenhydrAMINE (BENADRYL) 25 MG tablet Take 1 tablet (25 mg total) by mouth every 6 (six) hours as needed for itching.  Marland Kitchen EPINEPHrine 0.3 mg/0.3 mL IJ SOAJ injection Inject 0.3 mLs (0.3 mg total) into the muscle as needed for anaphylaxis.  . fluticasone furoate-vilanterol (BREO ELLIPTA) 100-25 MCG/INH AEPB Inhale 1 puff into the lungs daily.  .  hyoscyamine (LEVSIN) 0.125 MG tablet Take 1 tablet (0.125 mg total) by mouth as needed for cramping. Reported on 09/24/2015  . levothyroxine (SYNTHROID) 25 MCG tablet Take 1 tablet (25 mcg total) by mouth daily before breakfast.  . ondansetron (ZOFRAN) 8 MG tablet Take by mouth every 8 (eight) hours as needed for nausea or vomiting.  . rizatriptan (MAXALT-MLT) 10 MG disintegrating tablet Take 1 tablet (10 mg total) by mouth as needed for migraine. May repeat in 2 hours if needed  . UNABLE TO FIND Take 10 mLs by mouth daily as needed (immune system). Homemade elderberry syrup  . UNABLE TO FIND Take 2.4 mg by mouth daily. Med Name: Lithium OTC   No facility-administered encounter medications on file as of 08/08/2020.     Current Diagnosis/Assessment:  Goals Addressed            This Visit's Progress   . Chronic Care Mangement Pharmacy Care Plan       CARE PLAN ENTRY  Current Barriers:  . Chronic Disease Management support, education, and care coordination needs related to Asthma, Hypothyroidism, Bipolar Disorder, Leukemia, Stomach Cramping, Recurrent Shingles, Migraine, Allergy  Asthma . Pharmacist Clinical Goal(s) o Over the next 90 days, patient will work with PharmD and providers to reduce symptoms of asthma . Current regimen:  o Albuterol inhaler (ProAir) as needed o Breo Ellipta 100-1mg 1 puff daily . Patient self care activities - Over the next 120 days, patient will: o Maintain asthma medication regimen  Migraine . Pharmacist Clinical Goal(s) o Over the next 90 days, patient will work with PharmD and providers to reduce symptoms of  migraine . Current regimen:  o Rizatriptan 20m as needed o Excedrin migraine as needed . Interventions: o Rx for Rizatriptan requested for refill . Patient self care activities - Over the next 90 days, patient will: o Maintain migraine medication regimen  Hypothyroidism . Pharmacist Clinical Goal(s) o Over the next 90 days, patient will  work with PharmD and providers to reduce symptoms of hypothyroidism . Current regimen:  o Levothyroxine 291m daily . Patient self care activities - Over the next 90 days, patient will: o Maintain thyroid medication regimen    Medication management . Pharmacist Clinical Goal(s): o Over the next 90 days, patient will work with PharmD and providers to maintain optimal medication adherence . Current pharmacy: [facility] . Interventions o Comprehensive medication review performed. o Continue current medication management strategy . Patient self care activities - Over the next 90 days, patient will: o Focus on medication adherence by filling and taking medications appropriately  o Take medications as prescribed o Report any questions or concerns to PharmD and/or provider(s)  Please see past updates related to this goal by clicking on the "Past Updates" button in the selected goal       Social Hx:  [place]  [place]  [place]  [place] in [place] often, so [place]  [place]  Exercise:  Started an exercise workout yesterday. Does 10 minutes 3 times per day of jumping on small trampoline.   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  Update 05/08/20 Going to beach next week.  Asthma / Tobacco   Last spirometry score: 10/29/2016 FCV: 3.2 FEV1: 2.8 FEV1/FVC: 87%  Eosinophil count:   Lab Results  Component Value Date/Time   EOSPCT 2.6 01/18/2020 02:51 PM   EOSPCT 2.2 09/04/2016 07:54 AM  %                               Eos (Absolute):  Lab Results  Component Value Date/Time   EOSABS 0.2 01/18/2020 02:51 PM   EOSABS 0.3 09/04/2016 07:54 AM    Tobacco Status:  Social History   Tobacco Use  Smoking Status  Former Smoker  . Quit date: 07/20/1980  . Years since quitting: 40.0  Smokeless Tobacco Never Used    Patient has failed these meds in past: None noted  Patient is currently controlled on the following medications:   Albuterol inhaler (ProAir) as needed  Breo Ellipta 100-2567m1 puff daily Using maintenance inhaler regularly? No Frequency of rescue inhaler use:  infrequently (during the spring and fall)  She feels like the Bre[place]s her bipolar disorder.  Uses the Proair prn for rescue.  Pt has appt with PCP and will mention need for refills.  Update 05/08/20 Has refills, but hasn't used her inhalers.  Feels her breathing is fine.  Update 08/08/20 Still not using Breo as maintenance Has plenty on hand, only using albuterol as needed No real exacerbations, some increased SOB during recent sickness  Plan -Continue current medications   Hypothyroidism   Lab Results  Component Value Date/Time   TSH 2.84 08/01/2020 03:52 PM   TSH 4.65 (H) 06/17/2020 11:18 AM   TSH 1.89  01/18/2020 02:51 PM   Patient has failed these meds in past: None noted  Patient is currently controlled on the following medications:   Levothyroxine 89mg daily  Update 05/08/20 TSH WNL  Update 08/08/20 TSH WNL at most recent OV, no symptoms  Plan -Continue current medications   Depression Screening   [facility] 05/08/2020 05/16/2019 05/05/2018  Decreased Interest 1 1 0  Down, Depressed, Hopeless 0 1 1  PHQ - 2 Score 1 2 1   Altered sleeping 2 1 -  Tired, decreased energy 1 1 -  Change in appetite 1 1 -  Feeling bad or failure about yourself  1 1 -  Trouble concentrating 1 - -  Moving slowly or fidgety/restless 1 1 -  Suicidal thoughts 1 0 -  PHQ-9 Score 9 7 -  Difficult doing work/chores - Somewhat difficult -  Some recent data might be hidden   Patient has failed these meds in past: Patient states she has tried and failed many meds, can't specify which  Patient is  currently uncontrolled on the following medications:  . None (being followed by Chaplain for emotional support)  Talks to her therapist about her fleeting suicidal thoughts.  Wants to focus on getting her strength from God rather than people Does not want to try any mental health meds Is not currently feeling suicidal. Encouraged patient to continue follow up with chaplain  Update 08/08/20 Met with chaplain yesterday, recently lost her Aunt due to COVID+cancer Following up prn  Plan -Continue following with chaplain for emotional support   Stomach Cramping    Patient has failed these meds in past: None noted  Patient is currently controlled on the following medications:   Hyoscyamine 0.1239mas needed  "This is my wonder drug" She has to take this at first sign of pain. The pain is so severe that she can't do anything afterwards. She has one remaining.   To get refills from Dr. CoLorelei PontUpdate 05/08/20 Received refills of hyoscyamine.  Plan -Continue current medications   Migraine    Patient has failed these meds in past: None noted  Patient is currently uncontrolled on the following medications:   Rizatriptan 1040ms needed, excedrin migraine as needed   Followed by neuro Dr. VikEarlean Polkaenumalli. Does not have followup appt scheduled.  States that excedrin works better than tylenol. She is starting to have more frequent migraines. With her worst migraines she can't function. She will try tylenol first and if that does not resolve it then she would move on to rizatriptan.    Update 05/08/20 Received refills of rizatriptan  Update 08/08/20 She is starting to have increased amount of migraines She [facility] Max[facility], will request Rx  Plan -Continue current medications  Request Rx for maxalt Foot Pain?    Patient has failed these meds in past: None noted  Patient is currently uncontrolled on the following  medications: . None  Lump on bottom of her foot that feels itchy.  Hasn't gone away in a year.  Feels it has gotten worse. Flares for a week, then resolves. Encouraged pt to followup with Dr. CopLorelei PontPlan -Continue current medications   Risk of Anaphylaxis    Patient has failed these meds in past: None noted  Patient is currently stable on the following medications: None noted   States her epipen is expired.  Wonders if she should get a new pen.  Discussed risk/benefit  of expired pen vs new pen  To get refill from Dr. Lorelei [name]  Update 05/08/20 Received refill of epipen  Plan -Continue current management   Miscellaneous Meds Tylenol Benadryl (for allergies and sleep. Hasn't used since reaction with vaccine)  Beverly Milch, PharmD Clinical Pharmacist [phone]

## 2020-08-07 ENCOUNTER — Encounter: Payer: Self-pay | Admitting: General Practice

## 2020-08-07 NOTE — Progress Notes (Signed)
[facility]ual [facility] Note  Provided emotional support as Morgan [name] processed the death of her dear Morgan [name] due to complications from covid+cancer. Aunt [name] was Morgan [name]'s one family member who had cancer, so this means the loss of a close relationship and particularly one of mutual support. Meanwhile, Morgan [name] is also processing news of lab results, including low white count, and concomitant susceptibility and tiredness. She plans to pick up Morgan [name]'s latest book, a loan from [facility], as a source of meaning-making comfort during the forecast winter weather event (which may mean an additional layer of isolation). Morgan [name] reaches out regularly as needed.   [place], [place], [facility] Pager [phone] Voicemail [phone]

## 2020-08-08 ENCOUNTER — Ambulatory Visit: Payer: Medicare Other

## 2020-08-08 ENCOUNTER — Telehealth: Payer: Self-pay

## 2020-08-08 DIAGNOSIS — G43909 Migraine, unspecified, not intractable, without status migrainosus: Secondary | ICD-10-CM

## 2020-08-08 DIAGNOSIS — E039 Hypothyroidism, unspecified: Secondary | ICD-10-CM

## 2020-08-08 DIAGNOSIS — F319 Bipolar disorder, unspecified: Secondary | ICD-10-CM

## 2020-08-08 DIAGNOSIS — J45909 Unspecified asthma, uncomplicated: Secondary | ICD-10-CM

## 2020-08-08 MED ORDER — RIZATRIPTAN BENZOATE 10 MG PO TBDP: 10.0000 mg | ORAL_TABLET | ORAL | 11 refills | Status: DC | PRN

## 2020-08-08 NOTE — Patient Instructions (Addendum)
Visit Information  Goals Addressed            This Visit's Progress   . Chronic Care Mangement Pharmacy Care Plan       CARE PLAN ENTRY  Current Barriers:  . Chronic Disease Management support, education, and care coordination needs related to Asthma, Hypothyroidism, Bipolar Disorder, Leukemia, Stomach Cramping, Recurrent Shingles, Migraine, Allergy  Asthma . Pharmacist Clinical Goal(s) o Over the next 90 days, patient will work with PharmD and providers to reduce symptoms of asthma . Current regimen:  o Albuterol inhaler (ProAir) as needed o Breo Ellipta 100-101mcg 1 puff daily . Patient self care activities - Over the next 120 days, patient will: o Maintain asthma medication regimen  Migraine . Pharmacist Clinical Goal(s) o Over the next 90 days, patient will work with PharmD and providers to reduce symptoms of migraine . Current regimen:  o Rizatriptan 10mg  as needed o Excedrin migraine as needed . Interventions: o Rx for Rizatriptan requested for refill . Patient self care activities - Over the next 90 days, patient will: o Maintain migraine medication regimen  Hypothyroidism . Pharmacist Clinical Goal(s) o Over the next 90 days, patient will work with PharmD and providers to reduce symptoms of hypothyroidism . Current regimen:  o Levothyroxine 52mcg daily . Patient self care activities - Over the next 90 days, patient will: o Maintain thyroid medication regimen    Medication management . Pharmacist Clinical Goal(s): o Over the next 90 days, patient will work with PharmD and providers to maintain optimal medication adherence . Current pharmacy: [facility] . Interventions o Comprehensive medication review performed. o Continue current medication management strategy . Patient self care activities - Over the next 90 days, patient will: o Focus on medication adherence by filling and taking medications appropriately  o Take medications as prescribed o Report any  questions or concerns to PharmD and/or provider(s)  Please see past updates related to this goal by clicking on the "Past Updates" button in the selected goal         The patient verbalized understanding of instructions, educational materials, and care plan provided today and agreed to receive a mailed copy of patient instructions, educational materials, and care plan.   Telephone follow up appointment with pharmacy team member scheduled for: 3 months  Edythe Clarity, [facility]  Asthma Attack Prevention, Adult Although you may not be able to control the fact that you have asthma, you can take actions to prevent episodes of asthma (asthma attacks). How can this condition affect me? Asthma attacks (flare ups) can cause trouble breathing, wheezing, and coughing. They may keep you from doing activities you like to do. What can increase my risk? Coming into contact with things that cause asthma symptoms (asthma triggers) can put you at risk for an asthma attack. Common asthma triggers include:  Things you are allergic to (allergens), such as: ? Dust mite and cockroach droppings. ? Pet dander. ? Mold. ? Pollen from trees and grasses. ? Food allergies. This might be a specific food or added chemicals called sulfites.  Irritants, such as: ? Weather changes including very cold, dry, or humid air. ? Smoke. This includes campfire smoke, air pollution, and tobacco smoke. ? Strong odors from aerosol sprays and fumes from perfume, candles, and household cleaners.  Other triggers, such as: ? Certain medicines. This includes NSAIDs, such as ibuprofen and aspirin. ? Viral respiratory infections (colds), including runny nose (rhinitis) or infection in the sinuses (sinusitis). ? Activity including exercise, laughing,  or crying. ? Not using inhaled medicines (corticosteroids) as told. What actions can I take to prevent an asthma attack?  Stay healthy. Stay up to date on all immunizations as told by  your health care provider, including the yearly flu (influenza) vaccine and pneumonia vaccine.  Many asthma attacks can be prevented by carefully following your written asthma action plan. Follow your asthma action plan Work with your health care provider to create a written asthma action plan. This plan should include:  A list of your asthma triggers and how to avoid or reduce them.  A list of symptoms that you may have during an asthma attack.  Information about which medicine to take, when to take the medicine, and how much of the medicine to take.  Information to help you understand your peak flow measurements.  Daily actions that you can take to prevent (control) your asthma symptoms.  Contact information for your health care providers.  If you have an asthma attack, act quickly. Follow the emergency steps on your written asthma action plan. This may prevent you from needing to go to the hospital. Monitor your asthma. To do this:  Use your peak flow meter every morning and every evening for 2-3 weeks or as told by your health care provider. ? Record the results in a journal. ? A drop in your peak flow numbers on one or more days may mean that you are starting to have an asthma attack, even if you are not having symptoms.  When you have asthma symptoms, write them down in a journal.  Write down in your journal how often you need to use your fast-acting rescue inhaler. If you are using your rescue inhaler more often, it may mean that your asthma is not under control. Talk with your health care provider about adjusting your asthma treatment plan to help you prevent future asthma attacks and gain better control of your condition.   Lifestyle  Avoid or reduce contact with known outdoor allergens by staying indoors, keeping windows closed, and using air conditioning when pollen and mold counts are high.  Do not use any products that contain nicotine or tobacco, such as cigarettes,  e-cigarettes, and chewing tobacco. If you need help quitting, ask your health care provider.  If you are overweight, consider a weight-loss plan.  Find ways to cope with stress and your feelings, such as mindfulness, relaxation, or breathing exercises.  Ask your health care provider if a breathing exercise program (pulmonary rehabilitation) may be helpful to control symptoms and improve your quality of life. Medicines  Take over-the-counter and prescription medicines only as told by your health care provider.  Do not stop taking your medicine and do not take less medicine even if you are doing well.  Let your health care provider know: ? How often you use your rescue inhaler. ? How often you have symptoms when you are taking your regular medicines. ? If you wake up at night because of asthma symptoms. ? If you have more trouble with your breathing when you exercise.   Activity  Do your normal activities as told by your health care provider. Ask your health care provider what activities are safe for you.  Some people have asthma symptoms or more asthma symptoms when they exercise. This is called exercise-induced bronchoconstriction (EIB). If you have this problem, talk with your health care provider about how to manage EIB. Some tips to follow include: ? Use your fast-acting inhaler before exercise. ? Exercise  indoors if it is very cold, humid, or the pollen and mold counts are high. ? Warm up and cool down before and after exercise. ? Stop exercising right away if your asthma symptoms start or get worse. Where to find more information  Asthma and Allergy Foundation of America: www.aafa.org  Centers for Disease Control and Prevention: http://www.wolf.info/  American Lung Association: www.lung.org  National Heart, Lung, and Blood Institute: https://wilson-eaton.com/  World Health Organization: RoleLink.com.br Get help right away if:  You have followed your written asthma action plan and your symptoms  are not improving. Summary  Asthma attacks (flare ups) can cause trouble breathing, wheezing, and coughing. They may keep you from doing activities you normally like to do.  Work with your health care provider to create a written asthma action plan.  Do not stop taking your medicine and do not take less medicine even if you are doing well.  Do not use any products that contain nicotine or tobacco, such as cigarettes, e-cigarettes, and chewing tobacco. If you need help quitting, ask your health care provider. This information is not intended to replace advice given to you by your health care provider. Make sure you discuss any questions you have with your health care provider. Document Revised: 07/04/2019 Document Reviewed: 07/04/2019 Elsevier Patient Education  2021 [employer].

## 2020-08-08 NOTE — Telephone Encounter (Signed)
-----  Message from Edythe Clarity, [facility] sent at 08/08/2020 10:52 AM EST ----- Hey!  I am helping to cover Kanesha's patient until a new pharmacist is hired for your clinic.  Met with this patient today and she has requested a refill on her Rizatriptan to be sent in to [facility].  Thanks!  Let me know if there is a different way you want me to handle refills/messages etc.  Beverly Milch, PharmD Clinical Pharmacist [place] [phone]

## 2020-08-09 ENCOUNTER — Telehealth: Payer: Self-pay | Admitting: Pharmacist

## 2020-08-09 NOTE — Progress Notes (Signed)
    Chronic Care Management Pharmacy Assistant   Name: Yulitza Shorts  MRN: [id number] DOB: Feb 21, 1966  Reason for Encounter: Adherence Review  Patient Questions:  1.  Have you seen any other providers since your last visit? No.   2.  Any changes in your medicines or health? No.    PCP : Copland, Gay Filler, MD  Verified Adherence Gap Information.Per insurance data patient has not met their annual wellness and wellness bundle screening. Patient has met their breast cancer screening.Their most recent A1C was 5.5 on 02/16/19.  Follow-Up:  Pharmacist Review

## 2020-08-12 DIAGNOSIS — R911 Solitary pulmonary nodule: Secondary | ICD-10-CM | POA: Diagnosis not present

## 2020-08-12 DIAGNOSIS — J45909 Unspecified asthma, uncomplicated: Secondary | ICD-10-CM | POA: Diagnosis not present

## 2020-08-12 DIAGNOSIS — R3129 Other microscopic hematuria: Secondary | ICD-10-CM | POA: Diagnosis not present

## 2020-08-12 DIAGNOSIS — C9110 Chronic lymphocytic leukemia of B-cell type not having achieved remission: Secondary | ICD-10-CM | POA: Diagnosis not present

## 2020-08-12 DIAGNOSIS — R21 Rash and other nonspecific skin eruption: Secondary | ICD-10-CM | POA: Diagnosis not present

## 2020-08-12 DIAGNOSIS — K589 Irritable bowel syndrome without diarrhea: Secondary | ICD-10-CM | POA: Diagnosis not present

## 2020-08-12 DIAGNOSIS — Z79899 Other long term (current) drug therapy: Secondary | ICD-10-CM | POA: Diagnosis not present

## 2020-08-12 DIAGNOSIS — E039 Hypothyroidism, unspecified: Secondary | ICD-10-CM | POA: Diagnosis not present

## 2020-08-12 DIAGNOSIS — D801 Nonfamilial hypogammaglobulinemia: Secondary | ICD-10-CM | POA: Diagnosis not present

## 2020-08-12 DIAGNOSIS — R002 Palpitations: Secondary | ICD-10-CM | POA: Diagnosis not present

## 2020-08-12 DIAGNOSIS — R42 Dizziness and giddiness: Secondary | ICD-10-CM | POA: Diagnosis not present

## 2020-08-12 DIAGNOSIS — R234 Changes in skin texture: Secondary | ICD-10-CM | POA: Diagnosis not present

## 2020-08-12 DIAGNOSIS — B028 Zoster with other complications: Secondary | ICD-10-CM | POA: Diagnosis not present

## 2020-08-12 DIAGNOSIS — Z87891 Personal history of nicotine dependence: Secondary | ICD-10-CM | POA: Diagnosis not present

## 2020-08-12 LAB — CBC AND DIFFERENTIAL
HCT: 44 (ref 36–46)
Hemoglobin: 14.2 (ref 12.0–16.0)
Platelets: 259 (ref 150–399)
WBC: 8.3

## 2020-08-12 LAB — HEPATIC FUNCTION PANEL
ALT: 11 (ref 7–35)
AST: 16 (ref 13–35)
Alkaline Phosphatase: 51 (ref 25–125)
Bilirubin, Total: 0.7

## 2020-08-12 LAB — BASIC METABOLIC PANEL
BUN: 13 (ref 4–21)
Chloride: 107 (ref 99–108)
Creatinine: 0.7 (ref 0.5–1.1)
Glucose: 78
Potassium: 3.8 (ref 3.4–5.3)
Sodium: 139 (ref 137–147)

## 2020-08-12 LAB — COMPREHENSIVE METABOLIC PANEL
Calcium: 9.6 (ref 8.7–10.7)
GFR calc non Af Amer: 98

## 2020-08-12 LAB — TSH: TSH: 5.82 (ref 0.41–5.90)

## 2020-08-15 DIAGNOSIS — Z87440 Personal history of urinary (tract) infections: Secondary | ICD-10-CM | POA: Diagnosis not present

## 2020-08-15 DIAGNOSIS — D839 Common variable immunodeficiency, unspecified: Secondary | ICD-10-CM | POA: Diagnosis not present

## 2020-08-15 DIAGNOSIS — T360X5A Adverse effect of penicillins, initial encounter: Secondary | ICD-10-CM | POA: Diagnosis not present

## 2020-08-15 DIAGNOSIS — D801 Nonfamilial hypogammaglobulinemia: Secondary | ICD-10-CM | POA: Diagnosis not present

## 2020-08-15 DIAGNOSIS — L500 Allergic urticaria: Secondary | ICD-10-CM | POA: Diagnosis not present

## 2020-08-15 DIAGNOSIS — Z856 Personal history of leukemia: Secondary | ICD-10-CM | POA: Diagnosis not present

## 2020-08-20 ENCOUNTER — Ambulatory Visit (INDEPENDENT_AMBULATORY_CARE_PROVIDER_SITE_OTHER): Payer: Medicare Other | Admitting: Family Medicine

## 2020-08-20 ENCOUNTER — Encounter: Payer: Self-pay | Admitting: Family Medicine

## 2020-08-20 ENCOUNTER — Other Ambulatory Visit: Payer: Self-pay

## 2020-08-20 VITALS — BP 115/70 | HR 77 | Ht 63.0 in | Wt 187.0 lb

## 2020-08-20 DIAGNOSIS — R200 Anesthesia of skin: Secondary | ICD-10-CM | POA: Diagnosis not present

## 2020-08-20 DIAGNOSIS — C9110 Chronic lymphocytic leukemia of B-cell type not having achieved remission: Secondary | ICD-10-CM

## 2020-08-20 DIAGNOSIS — G43109 Migraine with aura, not intractable, without status migrainosus: Secondary | ICD-10-CM

## 2020-08-20 DIAGNOSIS — G8929 Other chronic pain: Secondary | ICD-10-CM

## 2020-08-20 DIAGNOSIS — M25562 Pain in left knee: Secondary | ICD-10-CM

## 2020-08-20 NOTE — Progress Notes (Signed)
Chief Complaint  Patient presents with  . Migraine    RM 1 alone Pt states she had a migraine for 24 hrs and was vomiting      HISTORY OF PRESENT ILLNESS: 08/20/20  Morgan [name] is a 55 y.o. female here today for follow up for migraines and numbness. She has continued rizatriptan for abortive therapy. She reports headache days wax and wane. Some months she may have 2-3 headache days and other months she has 9-12 headache days. She usually has 1-3 migraines each month. Usually takes rizatriptan 1-3 times a month. Most wake her from her sleep. If she gets up, headache usually gets better. She had sleep study about 10 years ago that was reportedly normal.  NCS/EMG showed mild carpal tunnel syndrome. Labs were unremarkable with exception of low IgG, IgA and IgM. She continues to follow closely with oncology. Mood is stable. Not on stabilizers at this time. Continues to see counselor. She has extensive allergy list and is hesitant to try new meds.   Tried and failed: topiramate, gabapentin (worsened Bipolar), rizatriptan   HISTORY (copied from previous note)  UPDATE (07/26/19, VRP): Since last visit, doing well with HA on rizatriptan. Symptoms are improved. Now with new onset numbness and weakness in feet and hands starting in Aug 2020. (started in feet; then moved to hands).   PRIOR HPI (05/23/18): 55 year old female with history of CLL, bipolar disorder, IBS, here for evaluation of headaches.  Patient has history of migraine headaches since age 46 years old with left-sided throbbing severe headaches with nausea, photophobia, phonophobia, triggered by sunlight.  She was on Imitrex in the past which helped.  Her last major migraine headache was in June 2016.  In 2017 patient developed new type of "weird headache".  These headaches consist of occipital and whole head throbbing sensation, nausea, vomiting, some restlessness, numbness in the face, foggy thinking, blurred vision.  These are  different than her prior migraine headaches.  Patient also has recent diagnosis of CLL, currently not on treatment due to exacerbation of bipolar symptoms with CLL treatment.  Patient also has hypothyroidism, not currently on treatment because patient unable to afford medications.  Recent TSH was greater than 8.  Patient has had clusters of headache in May and October 2019.  Patient has had more than 5-6 headaches in the last 1 month.    REVIEW OF SYSTEMS: Out of a complete 14 system review of symptoms, the patient complains only of the following symptoms, headaches, numbness, tingling of head, arms and legs, depression, and all other reviewed systems are negative.    ALLERGIES: Allergies  Allergen Reactions  . Aripiprazole Anaphylaxis and Swelling  . Ciprofloxacin Shortness Of Breath  . Lamotrigine Rash and Other (See Comments)  . Nitrofurantoin Hives  . Other Anaphylaxis, Hives and Swelling    Walnuts and pine nuts  . Peanut Oil Anaphylaxis, Hives, Swelling and Rash  . Amoxicillin Hives and Rash    Has patient had a PCN reaction causing immediate rash, facial/tongue/throat swelling, SOB or lightheadedness with hypotension: Yes Has patient had a PCN reaction causing severe rash involving mucus membranes or skin necrosis: No Has patient had a PCN reaction that required hospitalization: No Has patient had a PCN reaction occurring within the last 10 years: No If all of the above answers are "NO", then may proceed with Cephalosporin use.  Marland Kitchen Doxycycline Hives and Rash  . Latex Rash and Other (See Comments)  . Meloxicam Itching and Other (See Comments)  Induces mania Interacts with Lithium  . Naproxen Hives and Other (See Comments)    Interacts with lithium  . Pramipexole Hives and Rash  . Prednisone Other (See Comments)    Interacts with Lithium  . Risperidone Hives and Rash  . Sulfa Antibiotics Rash    "that leaves scarring"  . Tape Itching and Rash    Steri-StripsT  .  Cortisone Other (See Comments)    Exacerbates the mania of her bipolar  . Percocet [Oxycodone-Acetaminophen] Other (See Comments)    Severe dizziness  . Shingrix [Zoster Vac Recomb Adjuvanted] Swelling    rash  . Vancomycin Itching    Itching on head and at IV insertion site where Vanc running     HOME MEDICATIONS: Outpatient Medications Prior to Visit  Medication Sig Dispense Refill  . acalabrutinib (CALQUENCE) 100 MG capsule Take 100 mg by mouth every 12 (twelve) hours.     Marland Kitchen acetaminophen (TYLENOL) 500 MG tablet Take 1,000 mg by mouth every 6 (six) hours as needed for mild pain.    Marland Kitchen albuterol (VENTOLIN HFA) 108 (90 Base) MCG/ACT inhaler Inhale 1-2 puffs into the lungs every 6 (six) hours as needed for wheezing or shortness of breath. 18 g 6  . aspirin-acetaminophen-caffeine (EXCEDRIN EXTRA STRENGTH) 250-250-65 MG tablet Take 2 tablets by mouth every 6 (six) hours as needed for headache.    . diphenhydrAMINE (BENADRYL) 25 MG tablet Take 1 tablet (25 mg total) by mouth every 6 (six) hours as needed for itching. 20 tablet 0  . EPINEPHrine 0.3 mg/0.3 mL IJ SOAJ injection Inject 0.3 mLs (0.3 mg total) into the muscle as needed for anaphylaxis. 1 each prn  . fluticasone furoate-vilanterol (BREO ELLIPTA) 100-25 MCG/INH AEPB Inhale 1 puff into the lungs daily. 1 each 11  . hyoscyamine (LEVSIN) 0.125 MG tablet Take 1 tablet (0.125 mg total) by mouth as needed for cramping. Reported on 09/24/2015 10 tablet 2  . levothyroxine (SYNTHROID) 25 MCG tablet Take 1 tablet (25 mcg total) by mouth daily before breakfast. 90 tablet 1  . ondansetron (ZOFRAN) 8 MG tablet Take by mouth every 8 (eight) hours as needed for nausea or vomiting.    . rizatriptan (MAXALT-MLT) 10 MG disintegrating tablet Take 1 tablet (10 mg total) by mouth as needed for migraine. May repeat in 2 hours if needed 9 tablet 11  . UNABLE TO FIND Take 10 mLs by mouth daily as needed (immune system). Homemade elderberry syrup    . UNABLE TO  FIND Take 2.4 mg by mouth daily. Med Name: Lithium OTC    . valACYclovir (VALTREX) 500 MG tablet Take 500 mg by mouth 2 (two) times daily.     No facility-administered medications prior to visit.     PAST MEDICAL HISTORY: Past Medical History:  Diagnosis Date  . Anxiety   . Arthritis   . Asthma   . Bipolar disorder ([place])   . Bulging lumbar disc 07/19/05  . Chronic headaches   . Chronic lymphocytic leukemia ([place]) 07/31/2016  . Colon polyps   . Degenerative disorder of bone   . Depression   . History of borderline personality disorder   . Hypothyroidism 2007   Developed after use of Lithium  . IBS (irritable bowel syndrome) 1995  . Pneumonia   . Proctitis   . Shingles 07/2018  . Substance abuse ([place])    ativan last month     PAST SURGICAL HISTORY: Past Surgical History:  Procedure Laterality Date  . ABDOMINAL HYSTERECTOMY    .  APPENDECTOMY    . BUNIONECTOMY Right 06/2012  . CHOLECYSTECTOMY    . OOPHORECTOMY    . SHOULDER OPEN ROTATOR CUFF REPAIR Right 03/2010  . TUBAL LIGATION       FAMILY HISTORY: Family History  Problem Relation Age of Onset  . Depression Mother   . Hypertension Mother   . Post-traumatic stress disorder Sister   . Alcohol abuse Brother   . Drug abuse Brother   . Post-traumatic stress disorder Brother   . Thyroid disease Father   . Alzheimer's disease Father   . COPD Maternal Grandmother   . Heart disease Maternal Grandfather   . Arthritis Paternal Grandmother   . Diabetes Paternal Grandmother   . Depression Paternal Grandmother   . Alzheimer's disease Paternal Grandmother   . Heart disease Paternal Grandfather   . Coronary artery disease Paternal Grandfather   . Alcohol abuse Paternal Grandfather   . Colon cancer Neg Hx   . Breast cancer Neg Hx      SOCIAL HISTORY: Social History   Socioeconomic History  . Marital status: Divorced    Spouse name: Not on file  . Number of children: 2  . Years of education: 64  . Highest  education level: Not on file  Occupational History    Comment: [employer]  Tobacco Use  . Smoking status: Former Smoker    Quit date: 07/20/1980    Years since quitting: 40.1  . Smokeless tobacco: Never Used  Vaping Use  . Vaping Use: Never used  Substance and Sexual Activity  . Alcohol use: Yes    Alcohol/week: 0.0 standard drinks    Comment: occ  . Drug use: Not Currently  . Sexual activity: Never    Birth control/protection: Surgical    Comment: intercourse age unknown,sexual partners less than 5  Other Topics Concern  . Not on file  Social History Narrative   Fun: [place], travel, music, writing, walking, hiking, volunteering   Denies religious beliefs effecting health care.    Feels safe at home.    Divorced,   Children 2.  Lives home alone.   Social Determinants of Health   Financial Resource Strain: High Risk  . Difficulty of Paying Living Expenses: Hard  Food Insecurity: Not on file  Transportation Needs: Not on file  Physical Activity: Not on file  Stress: Not on file  Social Connections: Not on file  Intimate Partner Violence: Not on file      PHYSICAL EXAM  Vitals:   08/20/20 1118  BP: 115/70  Pulse: 77  Weight: 187 lb (84.8 kg)  Height: 5\' 3"  (1.6 m)   Body mass index is 33.13 kg/m.   Generalized: Well developed, in no acute distress  Cardiology: normal rate and rhythm, no murmur auscultated  Respiratory: clear to auscultation bilaterally    Neurological examination  Mentation: Alert oriented to time, place, history taking. Follows all commands speech and language fluent Cranial nerve II-XII: Pupils were equal round reactive to light. Extraocular movements were full, visual field were full on confrontational test. Facial sensation and strength were normal. Uvula tongue midline. Head turning and shoulder shrug  were normal and symmetric. Motor: The motor testing reveals 5 over 5 strength of all 4 extremities. Good symmetric motor tone is noted throughout.   Sensory: Sensory testing is intact to soft touch on all 4 extremities. No evidence of extinction is noted.  Coordination: Cerebellar testing reveals good finger-nose-finger and heel-to-shin bilaterally.  Gait and station: Gait is normal. Tandem gait  is normal. Romberg is negative. No drift is seen.  Reflexes: Deep tendon reflexes are symmetric and normal bilaterally.     DIAGNOSTIC DATA (LABS, IMAGING, TESTING) - I reviewed patient records, labs, notes, testing and imaging myself where available.  Lab Results  Component Value Date   WBC 3.9 (L) 08/01/2020   HGB 15.1 (H) 08/01/2020   HCT 45.9 08/01/2020   MCV 83.6 08/01/2020   PLT 211.0 08/01/2020      Component Value Date/Time   NA 139 08/01/2020 1552   NA 142 03/10/2017 0954   NA 142 09/04/2016 0754   K 4.2 08/01/2020 1552   K 4.2 03/10/2017 0954   K 4.0 09/04/2016 0754   CL 104 08/01/2020 1552   CL 111 (H) 03/10/2017 0954   CO2 26 08/01/2020 1552   CO2 28 03/10/2017 0954   CO2 25 09/04/2016 0754   GLUCOSE 93 08/01/2020 1552   GLUCOSE 83 03/10/2017 0954   BUN 10 08/01/2020 1552   BUN 11 03/10/2017 0954   BUN 13.4 09/04/2016 0754   CREATININE 0.87 08/01/2020 1552   CREATININE 0.87 10/12/2018 0832   CREATININE 1.1 03/10/2017 0954   CREATININE 0.8 09/04/2016 0754   CALCIUM 10.3 08/01/2020 1552   CALCIUM 9.6 03/10/2017 0954   CALCIUM 9.7 09/04/2016 0754   PROT 6.9 08/01/2020 1552   PROT 6.6 07/26/2019 1307   PROT 6.8 03/10/2017 0954   PROT 6.6 09/04/2016 0754   ALBUMIN 4.7 08/01/2020 1552   ALBUMIN 3.7 03/10/2017 0954   ALBUMIN 4.0 09/04/2016 0754   AST 13 08/01/2020 1552   AST 22 10/12/2018 0832   AST 20 09/04/2016 0754   ALT 10 08/01/2020 1552   ALT 20 10/12/2018 0832   ALT 20 03/10/2017 0954   ALT 14 09/04/2016 0754   ALKPHOS 67 08/01/2020 1552   ALKPHOS 46 03/10/2017 0954   ALKPHOS 54 09/04/2016 0754   BILITOT 0.4 08/01/2020 1552   BILITOT 0.4 10/12/2018 0832   BILITOT 0.39 09/04/2016 0754   GFRNONAA  >60 02/15/2020 1611   GFRNONAA >60 10/12/2018 0832   GFRAA >60 02/15/2020 1611   GFRAA >60 10/12/2018 0832   Lab Results  Component Value Date   CHOL 229 (H) 06/17/2020   HDL 73.10 06/17/2020   LDLCALC 103 (H) 04/27/2018   LDLDIRECT 119.0 06/17/2020   TRIG 240.0 (H) 06/17/2020   CHOLHDL 3 06/17/2020   Lab Results  Component Value Date   HGBA1C 5.5 02/16/2019   Lab Results  Component Value Date   VITAMINB12 407 01/18/2020   Lab Results  Component Value Date   TSH 2.84 08/01/2020    MMSE - Mini Mental State Exam 10/22/2015  Not completed: (No Data)     No flowsheet data found.   ASSESSMENT AND PLAN  55 y.o. year old female  has a past medical history of Anxiety, Arthritis, Asthma, Bipolar disorder ([place]), Bulging lumbar disc (07/19/05), Chronic headaches, Chronic lymphocytic leukemia ([place]) (07/31/2016), Colon polyps, Degenerative disorder of bone, Depression, History of borderline personality disorder, Hypothyroidism (2007), IBS (irritable bowel syndrome) (1995), Pneumonia, Proctitis, Shingles (07/2018), and Substance abuse ([place]). here with   Migraine with aura and without status migrainosus, not intractable  Numbness  Chronic lymphocytic leukemia ([facility])  Darhonda is doing fairly well. She reports migraines are well managed on rizatriptan most of the time. She has had 1 migraine recently that was not responsive to multiple doses. We have discussed prevention versus change of abortive therapy. She is very hesitant to change as she  has done well on rizatriptan. I have encouraged her to monitor symptoms closely. She will take rizatriptan as soon as she feels migraine starting. May also take Tylenol and Benadryl if needed. Adequate hydration and stress management encouraged. She will continue close follow up with care team. May consider Roselyn Meier or Nurtec if needed in the future. She will follow up in 1 year, sooner if needed. She verbalizes understanding and agreement with this plan.     No orders of the defined types were placed in this encounter.    No orders of the defined types were placed in this encounter.     I spent 20 minutes of face-to-face and non-face-to-face time with patient.  This included previsit chart review, lab review, study review, order entry, electronic health record documentation, patient education.    Debbora Presto, MSN, FNP-C 08/20/2020, 11:27 AM  [facility] [address], [place], [place] [address] [phone]

## 2020-08-20 NOTE — Patient Instructions (Signed)
Below is our plan:  We will continue rizatriptan as needed for abortive therapy. You can take Tylenol as weel. May consider adding Benadryl if headache is really bad. We could try newer abortive medications if rizatriptan is not working as well. Nurtec and Roselyn Meier are good options to consider. We would have to see which your insurance will cover.   Please make sure you are staying well hydrated. I recommend 50-60 ounces daily. Well balanced diet and regular exercise encouraged.    Please continue follow up with care team as directed.   Follow up in 1 year   You may receive a survey regarding today's visit. I encourage you to leave honest feed back as I do use this information to improve patient care. Thank you for seeing me today!      Migraine Headache A migraine headache is a very strong throbbing pain on one side or both sides of your head. This type of headache can also cause other symptoms. It can last from 4 hours to 3 days. Talk with your doctor about what things may bring on (trigger) this condition. What are the causes? The exact cause of this condition is not known. This condition may be triggered or caused by:  Drinking alcohol.  Smoking.  Taking medicines, such as: ? Medicine used to treat chest pain (nitroglycerin). ? Birth control pills. ? Estrogen. ? Some blood pressure medicines.  Eating or drinking certain products.  Doing physical activity. Other things that may trigger a migraine headache include:  Having a menstrual period.  Pregnancy.  Hunger.  Stress.  Not getting enough sleep or getting too much sleep.  Weather changes.  Tiredness (fatigue). What increases the risk?  Being 63-50 years old.  Being female.  Having a family history of migraine headaches.  Being Caucasian.  Having depression or anxiety.  Being very overweight. What are the signs or symptoms?  A throbbing pain. This pain may: ? Happen in any area of the head, such  as on one side or both sides. ? Make it hard to do daily activities. ? Get worse with physical activity. ? Get worse around bright lights or loud noises.  Other symptoms may include: ? Feeling sick to your stomach (nauseous). ? Vomiting. ? Dizziness. ? Being sensitive to bright lights, loud noises, or smells.  Before you get a migraine headache, you may get warning signs (an aura). An aura may include: ? Seeing flashing lights or having blind spots. ? Seeing bright spots, halos, or zigzag lines. ? Having tunnel vision or blurred vision. ? Having numbness or a tingling feeling. ? Having trouble talking. ? Having weak muscles.  Some people have symptoms after a migraine headache (postdromal phase), such as: ? Tiredness. ? Trouble thinking (concentrating). How is this treated?  Taking medicines that: ? Relieve pain. ? Relieve the feeling of being sick to your stomach. ? Prevent migraine headaches.  Treatment may also include: ? Having acupuncture. ? Avoiding foods that bring on migraine headaches. ? Learning ways to control your body functions (biofeedback). ? Therapy to help you know and deal with negative thoughts (cognitive behavioral therapy). Follow these instructions at home: Medicines  Take over-the-counter and prescription medicines only as told by your doctor.  Ask your doctor if the medicine prescribed to you: ? Requires you to avoid driving or using heavy machinery. ? Can cause trouble pooping (constipation). You may need to take these steps to prevent or treat trouble pooping:  Drink enough fluid to keep  your pee (urine) pale yellow.  Take over-the-counter or prescription medicines.  Eat foods that are high in fiber. These include beans, whole grains, and fresh fruits and vegetables.  Limit foods that are high in fat and sugar. These include fried or sweet foods. Lifestyle  Do not drink alcohol.  Do not use any products that contain nicotine or tobacco,  such as cigarettes, e-cigarettes, and chewing tobacco. If you need help quitting, ask your doctor.  Get at least 8 hours of sleep every night.  Limit and deal with stress. General instructions  Keep a journal to find out what may bring on your migraine headaches. For example, write down: ? What you eat and drink. ? How much sleep you get. ? Any change in what you eat or drink. ? Any change in your medicines.  If you have a migraine headache: ? Avoid things that make your symptoms worse, such as bright lights. ? It may help to lie down in a dark, quiet room. ? Do not drive or use heavy machinery. ? Ask your doctor what activities are safe for you.  Keep all follow-up visits as told by your doctor. This is important.      Contact a doctor if:  You get a migraine headache that is different or worse than others you have had.  You have more than 15 headache days in one month. Get help right away if:  Your migraine headache gets very bad.  Your migraine headache lasts longer than 72 hours.  You have a fever.  You have a stiff neck.  You have trouble seeing.  Your muscles feel weak or like you cannot control them.  You start to lose your balance a lot.  You start to have trouble walking.  You pass out (faint).  You have a seizure. Summary  A migraine headache is a very strong throbbing pain on one side or both sides of your head. These headaches can also cause other symptoms.  This condition may be treated with medicines and changes to your lifestyle.  Keep a journal to find out what may bring on your migraine headaches.  Contact a doctor if you get a migraine headache that is different or worse than others you have had.  Contact your doctor if you have more than 15 headache days in a month. This information is not intended to replace advice given to you by your health care provider. Make sure you discuss any questions you have with your health care  provider. Document Revised: 10/28/2018 Document Reviewed: 08/18/2018 Elsevier Patient Education  [place].

## 2020-08-23 DIAGNOSIS — C8590 Non-Hodgkin lymphoma, unspecified, unspecified site: Secondary | ICD-10-CM | POA: Diagnosis not present

## 2020-08-23 DIAGNOSIS — R911 Solitary pulmonary nodule: Secondary | ICD-10-CM | POA: Diagnosis not present

## 2020-08-23 DIAGNOSIS — C9110 Chronic lymphocytic leukemia of B-cell type not having achieved remission: Secondary | ICD-10-CM | POA: Diagnosis not present

## 2020-08-26 ENCOUNTER — Encounter: Payer: Self-pay | Admitting: General Practice

## 2020-08-26 NOTE — Progress Notes (Signed)
[facility] Note  Morgan [name] reached out by phone to process recent PET scan results and treatment questions/stressors in order to prepare herself for contact with her physicians. She continues to utilize Spiritual Care well for such processing and theological reflection. She plans to participate in this week's "Sacred Pathways: Spiritual Tools for Coping" class and to reach out to chaplain again as needed.   [place], [place], [facility] Pager [phone] Voicemail [phone]

## 2020-08-26 NOTE — Progress Notes (Signed)
I reviewed note and agree with plan.   VIKRAM R. PENUMALLI, MD 08/26/2020, 3:51 PM Certified in Neurology, Neurophysiology and Neuroimaging  [facility] [address], [address] [place], [place] [address] [phone]  

## 2020-08-27 ENCOUNTER — Encounter: Payer: Self-pay | Admitting: Family Medicine

## 2020-08-27 DIAGNOSIS — N6332 Unspecified lump in axillary tail of the left breast: Secondary | ICD-10-CM

## 2020-08-27 DIAGNOSIS — N6320 Unspecified lump in the left breast, unspecified quadrant: Secondary | ICD-10-CM

## 2020-08-30 ENCOUNTER — Encounter: Payer: Self-pay | Admitting: General Practice

## 2020-08-30 DIAGNOSIS — Z87891 Personal history of nicotine dependence: Secondary | ICD-10-CM | POA: Diagnosis not present

## 2020-08-30 DIAGNOSIS — C9110 Chronic lymphocytic leukemia of B-cell type not having achieved remission: Secondary | ICD-10-CM | POA: Diagnosis not present

## 2020-08-30 DIAGNOSIS — D801 Nonfamilial hypogammaglobulinemia: Secondary | ICD-10-CM | POA: Diagnosis not present

## 2020-08-30 DIAGNOSIS — B029 Zoster without complications: Secondary | ICD-10-CM | POA: Diagnosis not present

## 2020-08-30 NOTE — Progress Notes (Signed)
[facility] Spiritual Care Note  Had Spiritual Care Virtual Visit via Webex for spiritual and emotional support as Morgan [name] processed the death of a friend from a CLL support group who died of covid complications. This loss stirs up grief for her beloved [name], as well as fear about mortality and covid risk with her own immune limitations. In addition to processing her grief, we also strategized about ways for her to stay present and grounded in the moment, building some joy into the weekend, such as cooking and walking as her knee allows. Miamor plans to reach out again as needed.   [place], [place], [facility] Pager [phone] Voicemail [phone]

## 2020-09-03 ENCOUNTER — Encounter: Payer: Self-pay | Admitting: Orthopaedic Surgery

## 2020-09-03 ENCOUNTER — Encounter: Payer: Self-pay | Admitting: General Practice

## 2020-09-03 ENCOUNTER — Ambulatory Visit (INDEPENDENT_AMBULATORY_CARE_PROVIDER_SITE_OTHER): Payer: Medicare Other | Admitting: Orthopaedic Surgery

## 2020-09-03 ENCOUNTER — Ambulatory Visit: Payer: Self-pay

## 2020-09-03 DIAGNOSIS — M1712 Unilateral primary osteoarthritis, left knee: Secondary | ICD-10-CM

## 2020-09-03 NOTE — Progress Notes (Signed)
Office Visit Note   Patient: Morgan [name]           Date of Birth: [birth year]           MRN: [id number] Visit Date: 09/03/2020              Requested by: Darreld Mclean, MD [address] [place],  [place] [address] PCP: Darreld Mclean, MD   Assessment & Plan: Visit Diagnoses:  1. Primary osteoarthritis of left knee     Plan: Discussed with patient sending her formal therapy to work on quad strengthening she defers.  Therefore discussed knee friendly exercises with her showed her quad strengthening exercises.  Also discussed with her possible supplemental injection left knee.  Again she is unable to do NSAIDs or cortisone.  Questions were encouraged and answered at length today.  She will call and let us know if she would like to do a supplemental injection in the future.  Follow-Up Instructions: Return if symptoms worsen or fail to improve.   Orders:  Orders Placed This Encounter  Procedures  . XR Knee 1-2 Views Left   No orders of the defined types were placed in this encounter.     Procedures: No procedures performed   Clinical Data: No additional findings.   Subjective: Chief Complaint  Patient presents with  . Left Knee - Pain    HPI Morgan [name] is a 55 year old female comes in today with left knee pain that started on December 19 when [place] she had increased pain in the knee.  Pain in the front of the knee.  She states she has had pain in the knee off and on for years.  She notes the knee does give way on her at times.  No catching locking or painful popping.  Pain with going up and down the hills or steps.  She is not taking anything for the pain currently.  She is having treatment for chronic lymphocytic leukemia.  Review of Systems See HPI otherwise negative or noncontributory.  Objective: Vital Signs: There were no vitals taken for this visit.  Physical Exam Constitutional:      Appearance: She is not  ill-appearing or diaphoretic.  Pulmonary:     Effort: Pulmonary effort is normal.  Neurological:     Mental Status: She is alert and oriented to person, place, and time.  Psychiatric:        Mood and Affect: Mood normal.     Ortho Exam Bilateral knees good range of motion.  Nontender throughout the right knee.  Tenderness is over the medial aspect of the left knee.  Left knee with positive crepitus with passive range of motion in the patellofemoral region.  No instability valgus varus stressing of either knee.  No abnormal warmth erythema or effusion of either knee.  McMurray's is negative bilaterally. Specialty Comments:  No specialty comments available.  Imaging: XR Knee 1-2 Views Left  Result Date: 09/03/2020 Left knee 2 views: No acute fracture.  Mild narrowing medial joint line.  Periarticular spurring off the tibial plateau medially.  Loose body level mid patella lateral aspect knee unchanged in position from prior films in 2018.  Knee is well located.    PMFS History: Patient Active Problem List   Diagnosis Date Noted  . Throat discomfort 03/12/2020  . Hypothyroidism 11/18/2018  . Fever 06/18/2018  . Piriformis syndrome of right side 04/22/2017  . Lateral epicondylitis of left elbow 04/06/2017  .  Solitary pulmonary nodule 10/29/2016  . Chronic lymphocytic leukemia ([place]) 07/31/2016  . Mass in neck 04/17/2016  . Abdominal mass 04/17/2016  . Asthma 12/12/2015  . Multiple environmental allergies 12/12/2015  . Chronic lower back pain 09/12/2015  . Bipolar 1 disorder, depressed ([place]) 08/27/2015    Class: Chronic  . Borderline personality disorder ([place]) 08/15/2015  . Severe benzodiazepine use disorder ([place]) 08/15/2015  . Alcohol use disorder, moderate, dependence ([place]) 08/15/2015  . PTSD (post-traumatic stress disorder) 08/15/2015  . History of bipolar disorder 08/15/2015  . Pre-syncope 05/17/2015  . Skull deformity 05/17/2015  . Patellofemoral syndrome of both knees  05/02/2015   Past Medical History:  Diagnosis Date  . Anxiety   . Arthritis   . Asthma   . Bipolar disorder ([place])   . Bulging lumbar disc 07/19/05  . Chronic headaches   . Chronic lymphocytic leukemia ([place]) 07/31/2016  . Colon polyps   . Degenerative disorder of bone   . Depression   . History of borderline personality disorder   . Hypothyroidism 2007   Developed after use of Lithium  . IBS (irritable bowel syndrome) 1995  . Pneumonia   . Proctitis   . Shingles 07/2018  . Substance abuse ([place])    ativan last month    Family History  Problem Relation Age of Onset  . Depression Mother   . Hypertension Mother   . Post-traumatic stress disorder Sister   . Alcohol abuse Brother   . Drug abuse Brother   . Post-traumatic stress disorder Brother   . Thyroid disease Father   . Alzheimer's disease Father   . COPD Maternal Grandmother   . Heart disease Maternal Grandfather   . Arthritis Paternal Grandmother   . Diabetes Paternal Grandmother   . Depression Paternal Grandmother   . Alzheimer's disease Paternal Grandmother   . Heart disease Paternal Grandfather   . Coronary artery disease Paternal Grandfather   . Alcohol abuse Paternal Grandfather   . Colon cancer Neg Hx   . Breast cancer Neg Hx     Past Surgical History:  Procedure Laterality Date  . ABDOMINAL HYSTERECTOMY    . APPENDECTOMY    . BUNIONECTOMY Right 06/2012  . CHOLECYSTECTOMY    . OOPHORECTOMY    . SHOULDER OPEN ROTATOR CUFF REPAIR Right 03/2010  . TUBAL LIGATION     Social History   Occupational History    Comment: [employer]  Tobacco Use  . Smoking status: Former Smoker    Quit date: 07/20/1980    Years since quitting: 40.1  . Smokeless tobacco: Never Used  Vaping Use  . Vaping Use: Never used  Substance and Sexual Activity  . Alcohol use: Yes    Alcohol/week: 0.0 standard drinks    Comment: occ  . Drug use: Not Currently  . Sexual activity: Never    Birth control/protection: Surgical    Comment:  intercourse age unknown,sexual partners less than 5

## 2020-09-03 NOTE — Progress Notes (Signed)
[place] Spiritual Care Note  Followed up with Morgan [name] by phone per her request, providing opportunity for her to share and process recent health challenges, as well as her CLL group's reflections about their member who recently died. Morgan [name] is also working hard to find a new apartment/living situation, as her current rent is increasing unsustainably. She plans to reach out again as needed.   [place], [place], [facility] Pager [phone] Voicemail [phone]

## 2020-09-05 ENCOUNTER — Encounter: Payer: Self-pay | Admitting: General Practice

## 2020-09-06 NOTE — Progress Notes (Signed)
[place] Note  Morgan [name] phoned for help in processing new physical and emotional symptoms to help her prepare to explain them to her oncologist's team. Provided reflective listening and emotional support. Morgan [name] will reach out again as needed.   [place], [place], [facility] Pager [phone] Voicemail [phone]

## 2020-09-10 DIAGNOSIS — R590 Localized enlarged lymph nodes: Secondary | ICD-10-CM | POA: Diagnosis not present

## 2020-09-10 DIAGNOSIS — J45909 Unspecified asthma, uncomplicated: Secondary | ICD-10-CM | POA: Diagnosis not present

## 2020-09-10 DIAGNOSIS — R918 Other nonspecific abnormal finding of lung field: Secondary | ICD-10-CM | POA: Diagnosis not present

## 2020-09-10 DIAGNOSIS — R3129 Other microscopic hematuria: Secondary | ICD-10-CM | POA: Diagnosis not present

## 2020-09-10 DIAGNOSIS — Z87891 Personal history of nicotine dependence: Secondary | ICD-10-CM | POA: Diagnosis not present

## 2020-09-10 DIAGNOSIS — D801 Nonfamilial hypogammaglobulinemia: Secondary | ICD-10-CM | POA: Diagnosis not present

## 2020-09-10 DIAGNOSIS — K5900 Constipation, unspecified: Secondary | ICD-10-CM | POA: Diagnosis not present

## 2020-09-10 DIAGNOSIS — I493 Ventricular premature depolarization: Secondary | ICD-10-CM | POA: Diagnosis not present

## 2020-09-10 DIAGNOSIS — R002 Palpitations: Secondary | ICD-10-CM | POA: Diagnosis not present

## 2020-09-10 DIAGNOSIS — B029 Zoster without complications: Secondary | ICD-10-CM | POA: Diagnosis not present

## 2020-09-10 DIAGNOSIS — R42 Dizziness and giddiness: Secondary | ICD-10-CM | POA: Diagnosis not present

## 2020-09-10 DIAGNOSIS — Z79899 Other long term (current) drug therapy: Secondary | ICD-10-CM | POA: Diagnosis not present

## 2020-09-10 DIAGNOSIS — C9110 Chronic lymphocytic leukemia of B-cell type not having achieved remission: Secondary | ICD-10-CM | POA: Diagnosis not present

## 2020-09-10 DIAGNOSIS — R21 Rash and other nonspecific skin eruption: Secondary | ICD-10-CM | POA: Diagnosis not present

## 2020-09-10 DIAGNOSIS — K589 Irritable bowel syndrome without diarrhea: Secondary | ICD-10-CM | POA: Diagnosis not present

## 2020-09-10 DIAGNOSIS — R110 Nausea: Secondary | ICD-10-CM | POA: Diagnosis not present

## 2020-09-10 DIAGNOSIS — E039 Hypothyroidism, unspecified: Secondary | ICD-10-CM | POA: Diagnosis not present

## 2020-09-10 DIAGNOSIS — R61 Generalized hyperhidrosis: Secondary | ICD-10-CM | POA: Diagnosis not present

## 2020-09-11 ENCOUNTER — Encounter: Payer: Self-pay | Admitting: General Practice

## 2020-09-11 NOTE — Progress Notes (Signed)
[place] Note  Reached Morgan [name] by phone per her request, providing opportunity for her to share and process health/treatment updates. Provided pastoral presence, empathic listening, and emotional support. She will reach out again as needed.   [place], [place], [facility] Pager [phone] Voicemail [phone]

## 2020-09-12 DIAGNOSIS — T4995XD Adverse effect of unspecified topical agent, subsequent encounter: Secondary | ICD-10-CM | POA: Diagnosis not present

## 2020-09-12 DIAGNOSIS — D801 Nonfamilial hypogammaglobulinemia: Secondary | ICD-10-CM | POA: Diagnosis not present

## 2020-09-16 DIAGNOSIS — D801 Nonfamilial hypogammaglobulinemia: Secondary | ICD-10-CM | POA: Diagnosis not present

## 2020-09-17 DIAGNOSIS — R519 Headache, unspecified: Secondary | ICD-10-CM | POA: Diagnosis not present

## 2020-09-17 DIAGNOSIS — R590 Localized enlarged lymph nodes: Secondary | ICD-10-CM | POA: Diagnosis not present

## 2020-09-17 DIAGNOSIS — R202 Paresthesia of skin: Secondary | ICD-10-CM | POA: Diagnosis not present

## 2020-09-17 DIAGNOSIS — R070 Pain in throat: Secondary | ICD-10-CM | POA: Diagnosis not present

## 2020-09-17 DIAGNOSIS — M542 Cervicalgia: Secondary | ICD-10-CM | POA: Diagnosis not present

## 2020-09-17 DIAGNOSIS — J45909 Unspecified asthma, uncomplicated: Secondary | ICD-10-CM | POA: Diagnosis not present

## 2020-09-17 DIAGNOSIS — C9110 Chronic lymphocytic leukemia of B-cell type not having achieved remission: Secondary | ICD-10-CM | POA: Diagnosis not present

## 2020-09-17 DIAGNOSIS — R5383 Other fatigue: Secondary | ICD-10-CM | POA: Diagnosis not present

## 2020-09-17 DIAGNOSIS — E039 Hypothyroidism, unspecified: Secondary | ICD-10-CM | POA: Diagnosis not present

## 2020-09-23 DIAGNOSIS — D801 Nonfamilial hypogammaglobulinemia: Secondary | ICD-10-CM | POA: Diagnosis not present

## 2020-09-24 DIAGNOSIS — Z959 Presence of cardiac and vascular implant and graft, unspecified: Secondary | ICD-10-CM | POA: Diagnosis not present

## 2020-09-25 ENCOUNTER — Encounter: Payer: Self-pay | Admitting: General Practice

## 2020-09-25 DIAGNOSIS — Z959 Presence of cardiac and vascular implant and graft, unspecified: Secondary | ICD-10-CM | POA: Diagnosis not present

## 2020-09-25 NOTE — Progress Notes (Signed)
[place] Note  Scheduled Webex Spiritual Care Virtual Visit for tomorrow at 3:15 for pastoral listening and discernment support re treatment decisions per Morgan [name]'s request.   Chaplain [name], [place], [facility] Pager [phone] Voicemail [phone]

## 2020-09-26 ENCOUNTER — Encounter: Payer: Self-pay | Admitting: General Practice

## 2020-09-26 NOTE — Progress Notes (Signed)
[place] Spiritual Care Note  Had Spiritual Care Virtual Visit via Webex as planned, providing opportunity for [facility] to share, process, and write down her questions for her specialists regarding future treatment trajectories. Provided reflective listening and emotional support. Serenitie plans to follow up as needed.   [place], [place], [facility] Pager [phone] Voicemail [phone]

## 2020-09-30 DIAGNOSIS — D801 Nonfamilial hypogammaglobulinemia: Secondary | ICD-10-CM | POA: Diagnosis not present

## 2020-10-03 ENCOUNTER — Encounter: Payer: Self-pay | Admitting: General Practice

## 2020-10-03 NOTE — Progress Notes (Signed)
[place] Spiritual Care Note  Connected with Bradlee by phone regarding some spiritual discouragement she has felt related to treatment, as well as other personal and global concerns. Provided empathic listening, emotional support, normalization of feelings, and prayer per request. She will follow up again as needed.   [place], [place], [facility] Pager [phone] Voicemail [phone]

## 2020-10-07 DIAGNOSIS — D801 Nonfamilial hypogammaglobulinemia: Secondary | ICD-10-CM | POA: Diagnosis not present

## 2020-10-08 ENCOUNTER — Encounter: Payer: Self-pay | Admitting: General Practice

## 2020-10-08 DIAGNOSIS — C9110 Chronic lymphocytic leukemia of B-cell type not having achieved remission: Secondary | ICD-10-CM | POA: Diagnosis not present

## 2020-10-08 DIAGNOSIS — R3129 Other microscopic hematuria: Secondary | ICD-10-CM | POA: Diagnosis not present

## 2020-10-08 DIAGNOSIS — D801 Nonfamilial hypogammaglobulinemia: Secondary | ICD-10-CM | POA: Diagnosis not present

## 2020-10-08 DIAGNOSIS — R310 Gross hematuria: Secondary | ICD-10-CM | POA: Diagnosis not present

## 2020-10-08 NOTE — Progress Notes (Signed)
[place] Spiritual Care Note  Received and returned voicemail from [name]. She plans to phone again tomorrow to process after her mammogram.   Morgan [name], [place], [facility] Pager [phone] Voicemail [phone]

## 2020-10-09 ENCOUNTER — Ambulatory Visit
Admission: RE | Admit: 2020-10-09 | Discharge: 2020-10-09 | Disposition: A | Discharge status: Home / Self Care | Payer: Medicare Other | Source: Ambulatory Visit | Attending: Family Medicine | Admitting: Family Medicine

## 2020-10-09 ENCOUNTER — Other Ambulatory Visit: Payer: Self-pay

## 2020-10-09 DIAGNOSIS — N6012 Diffuse cystic mastopathy of left breast: Secondary | ICD-10-CM | POA: Diagnosis not present

## 2020-10-09 DIAGNOSIS — R928 Other abnormal and inconclusive findings on diagnostic imaging of breast: Secondary | ICD-10-CM | POA: Diagnosis not present

## 2020-10-09 DIAGNOSIS — N6320 Unspecified lump in the left breast, unspecified quadrant: Secondary | ICD-10-CM

## 2020-10-09 DIAGNOSIS — R922 Inconclusive mammogram: Secondary | ICD-10-CM | POA: Diagnosis not present

## 2020-10-11 ENCOUNTER — Encounter: Payer: Self-pay | Admitting: Family Medicine

## 2020-10-11 DIAGNOSIS — D801 Nonfamilial hypogammaglobulinemia: Secondary | ICD-10-CM | POA: Diagnosis not present

## 2020-10-11 DIAGNOSIS — B029 Zoster without complications: Secondary | ICD-10-CM | POA: Diagnosis not present

## 2020-10-11 DIAGNOSIS — C9110 Chronic lymphocytic leukemia of B-cell type not having achieved remission: Secondary | ICD-10-CM | POA: Diagnosis not present

## 2020-10-14 DIAGNOSIS — D801 Nonfamilial hypogammaglobulinemia: Secondary | ICD-10-CM | POA: Diagnosis not present

## 2020-10-16 ENCOUNTER — Encounter: Payer: Self-pay | Admitting: General Practice

## 2020-10-16 NOTE — Progress Notes (Signed)
[facility] Spiritual Care Note  Scheduled Spiritual Care Virtual Visit for tomorrow at 10:30 per Jazelyn's request for pre-op support.   [place], [place], [facility] Pager [phone] Voicemail [phone]

## 2020-10-17 ENCOUNTER — Encounter: Payer: Self-pay | Admitting: General Practice

## 2020-10-17 NOTE — Progress Notes (Signed)
[place] Spiritual Care Note  Had Spiritual Care Virtual Visit as planned for spiritual/emotional support and prayer prior to [facility] lymph node excision tomorrow. She is managing any potential anxiety well with prayer, spiritual practices, fellowship, and other coping tools. She plans to follow up after the procedure to share more.   [place], [place], [facility] Pager [phone] Voicemail [phone]

## 2020-10-18 DIAGNOSIS — Z91040 Latex allergy status: Secondary | ICD-10-CM | POA: Diagnosis not present

## 2020-10-18 DIAGNOSIS — Z882 Allergy status to sulfonamides status: Secondary | ICD-10-CM | POA: Diagnosis not present

## 2020-10-18 DIAGNOSIS — E039 Hypothyroidism, unspecified: Secondary | ICD-10-CM | POA: Diagnosis not present

## 2020-10-18 DIAGNOSIS — C9110 Chronic lymphocytic leukemia of B-cell type not having achieved remission: Secondary | ICD-10-CM | POA: Diagnosis not present

## 2020-10-18 DIAGNOSIS — C8301 Small cell B-cell lymphoma, lymph nodes of head, face, and neck: Secondary | ICD-10-CM | POA: Diagnosis not present

## 2020-10-18 DIAGNOSIS — Z87891 Personal history of nicotine dependence: Secondary | ICD-10-CM | POA: Diagnosis not present

## 2020-10-18 DIAGNOSIS — Z79899 Other long term (current) drug therapy: Secondary | ICD-10-CM | POA: Diagnosis not present

## 2020-10-21 DIAGNOSIS — D801 Nonfamilial hypogammaglobulinemia: Secondary | ICD-10-CM | POA: Diagnosis not present

## 2020-10-28 DIAGNOSIS — D801 Nonfamilial hypogammaglobulinemia: Secondary | ICD-10-CM | POA: Diagnosis not present

## 2020-10-29 DIAGNOSIS — L929 Granulomatous disorder of the skin and subcutaneous tissue, unspecified: Secondary | ICD-10-CM | POA: Diagnosis not present

## 2020-10-29 DIAGNOSIS — D801 Nonfamilial hypogammaglobulinemia: Secondary | ICD-10-CM | POA: Diagnosis not present

## 2020-10-29 DIAGNOSIS — C9110 Chronic lymphocytic leukemia of B-cell type not having achieved remission: Secondary | ICD-10-CM | POA: Diagnosis not present

## 2020-10-29 DIAGNOSIS — Z87891 Personal history of nicotine dependence: Secondary | ICD-10-CM | POA: Diagnosis not present

## 2020-10-31 ENCOUNTER — Encounter: Payer: Self-pay | Admitting: General Practice

## 2020-10-31 NOTE — Progress Notes (Signed)
[place] Spiritual Care Note  Had Spiritual Care Virtual Visit via Webex as scheduled. Morgan [name] used the opportunity to share and process her biopsy report and other health updates. Provided empathic listening and spiritual support. She knows to reach out as needed.    [place], [place], [facility] Pager [phone] Voicemail [phone]

## 2020-11-01 ENCOUNTER — Encounter: Payer: Self-pay | Admitting: General Practice

## 2020-11-01 DIAGNOSIS — L929 Granulomatous disorder of the skin and subcutaneous tissue, unspecified: Secondary | ICD-10-CM | POA: Diagnosis not present

## 2020-11-01 NOTE — Progress Notes (Signed)
[place] Spiritual Care Note  Followed up with Sharrell by phone per her request. Provided empathic listening, pastoral reflection, and emotional support as she processed having to share a difficult history with her infectious disease doctor. We set up a Spiritual Care Virtual Visit for next Friday, 4/22, for support regarding her upcoming CT scan.   [place], [place], [facility] Pager [phone] Voicemail [phone]

## 2020-11-04 DIAGNOSIS — D801 Nonfamilial hypogammaglobulinemia: Secondary | ICD-10-CM | POA: Diagnosis not present

## 2020-11-07 DIAGNOSIS — L929 Granulomatous disorder of the skin and subcutaneous tissue, unspecified: Secondary | ICD-10-CM | POA: Diagnosis not present

## 2020-11-08 ENCOUNTER — Ambulatory Visit (INDEPENDENT_AMBULATORY_CARE_PROVIDER_SITE_OTHER): Payer: Medicare Other | Admitting: Pharmacist

## 2020-11-08 ENCOUNTER — Telehealth: Payer: Self-pay | Admitting: Pharmacist

## 2020-11-08 ENCOUNTER — Encounter: Payer: Self-pay | Admitting: General Practice

## 2020-11-08 DIAGNOSIS — C9110 Chronic lymphocytic leukemia of B-cell type not having achieved remission: Secondary | ICD-10-CM

## 2020-11-08 DIAGNOSIS — J45909 Unspecified asthma, uncomplicated: Secondary | ICD-10-CM

## 2020-11-08 DIAGNOSIS — F319 Bipolar disorder, unspecified: Secondary | ICD-10-CM

## 2020-11-08 DIAGNOSIS — E039 Hypothyroidism, unspecified: Secondary | ICD-10-CM | POA: Diagnosis not present

## 2020-11-08 DIAGNOSIS — K3189 Other diseases of stomach and duodenum: Secondary | ICD-10-CM

## 2020-11-08 DIAGNOSIS — G43909 Migraine, unspecified, not intractable, without status migrainosus: Secondary | ICD-10-CM

## 2020-11-08 NOTE — Progress Notes (Signed)
[place] Spiritual Care Note  Met with Morgan [name] via Webex to give her opportunity to process recent symptoms and stressors. Provided empathic listening, affirmation of strengths, and prayer per request. She plans to follow up as needed.   [place], [place], [facility] Pager [phone] Voicemail [phone]

## 2020-11-08 NOTE — Telephone Encounter (Signed)
While on CCM call patient requested appt with PCP for increase in fatigue, especially after eating and increase in cramping of toes and burning pain in both feet.  Forwarding request to Dr [employer].

## 2020-11-08 NOTE — Patient Instructions (Signed)
Visit Information  PATIENT GOALS: Goals Addressed            This Visit's Progress   . Chronic Care Mangement Pharmacy Care Plan       CARE PLAN ENTRY  Current Barriers:  . Chronic Disease Management support, education, and care coordination needs related to Asthma, Hypothyroidism, Bipolar Disorder, Leukemia, Stomach Cramping, Recurrent Shingles, Migraine, Allergy  Asthma . Pharmacist Clinical Goal(s) o Over the next 90 days, patient will work with PharmD and providers to reduce symptoms of asthma . Current regimen:  o Albuterol inhaler (ProAir) as needed o Breo Ellipta 100-7mcg 1 puff daily . Interventions:  o Education provided regarding maintenance versus rescue inhaler and when to use.  o Reviewed inhaler use / technique . Patient self care activities - Over the next 120 days, patient will: o Maintain asthma medication regimen o Remember Memory Dance is maintenance inhaler - use every day; Albuterol is rescue inhaler and is used when you have shortness of breath or wheezing. o Remember to rinse mouth after using Breo to prevent infection / thrush.   Migraine . Pharmacist Clinical Goal(s) o Over the next 90 days, patient will work with PharmD and providers to reduce symptoms of migraine . Current regimen:  o Rizatriptan 10mg  as needed o Excedrin migraine as needed . Interventions: o none . Patient self care activities - Over the next 90 days, patient will: o Maintain migraine medication regimen  Hypothyroidism . Pharmacist Clinical Goal(s) o Over the next 90 days, patient will work with PharmD and providers to reduce symptoms of hypothyroidism . Current regimen:  o Levothyroxine 47mcg daily . Interventions: o Recommended follow up with PCP due to increase in fatigue . Patient self care activities - Over the next 90 days, patient will: o Maintain thyroid medication regimen  o Follow up with PCP - You should have received call from scheduler to make appointment; Call office  if you did not.  Acid Reflux / GERD:  . Pharmacist Clinical Goal(s) o Over the next 90 days, patient will work with PharmD and providers to reduce symptoms of acid reflux . Current regimen:  o Pantoprazole 40mg  daily (had been on hold while taking Calquence) o Hyoscamine 0.125mg  - take as needed for stomach cramping . Interventions: o Reviewed interaction between Calquence and pantoprazole o Ok to continue pantoprazole while Calquence is on hold but will need alternative for reflux if Calquence is restarted.  . Patient self care activities - Over the next 90 days, patient will: o Maintain current reflux regimen unless Calquence is restarted o Make sure oncology team is aware you have restarted pantoprazole.   Bipolar / Mood: . Pharmacist Clinical Goal(s) o Over the next 90 days, patient will work with PharmD and providers to maintain contact with mental health providers and maintain control of mood . Current regimen:  o No current pharmacotherapy  o Counseling . Interventions: o Endorsed continued follow up with counselors and spiritual care . Patient self care activities - Over the next 90 days, patient will: o Continue to meet with counselors with [facility] and [facility] o Continue to meet with chaplain with [facility]   Medication management . Pharmacist Clinical Goal(s): o Over the next 90 days, patient will work with PharmD and providers to maintain optimal medication adherence . Current pharmacy: [facility] . Interventions o Comprehensive medication review performed. o Continue current medication management strategy . Patient self care activities - Over the next 90 days, patient will:  o Focus on medication adherence by filling and taking medications appropriately  o Take medications as prescribed o Report any questions or concerns to PharmD and/or provider(s)  Please see past updates related to this goal by clicking on the "Past  Updates" button in the selected goal         The patient verbalized understanding of instructions, educational materials, and care plan provided today and declined offer to receive copy of patient instructions, educational materials, and care plan.   Telephone follow up appointment with care management team member scheduled for: 3 to 4 months  Morgan [name], PharmD Clinical Pharmacist [place] [facility]

## 2020-11-08 NOTE — Chronic Care Management (AMB) (Signed)
Chronic Care Management Pharmacy Note  11/08/2020 Name:  Morgan [name] MRN:  [id number] DOB:  1965/09/28  Subjective: Morgan [name] is an 55 y.o. year old female who is a primary patient of Copland, Gay Filler, MD.  The CCM team was consulted for assistance with disease management and care coordination needs.    Engaged with patient by telephone for follow up visit in response to provider referral for pharmacy case management and/or care coordination services.   Consent to Services:  The patient was given information about Chronic Care Management services, agreed to services, and gave verbal consent prior to initiation of services.  Please see initial visit note for detailed documentation.   Patient Care Team: Copland, Gay Filler, MD as PCP - General (Family Medicine) Binnie Rail, MD (Internal Medicine) Lanette Hampshire, MD as Referring Physician (Internal Medicine) Jeanann Lewandowsky, MD as Referring Physician (Oncology) [name], PharmD (Pharmacist) Mcarthur Rossetti, MD as Consulting Physician (Orthopedic Surgery) Rockne Coons, MD (Infectious Diseases)  Recent office visits: 08/01/2020: PCP (Dr Lorelei Pont) Video visit for Cough, CP and sore throat; RUQ pain where she had shingles in past; COVID test neg; EKG, CBC, TSH, troponin; No med changes.    Recent consult visits: Counselor at [place] from [facility] weekly  Oncology Counselor form [place] weekly - [place] Other  11/07/2020: ID / Transplant Clinic (Dr Jackquline [facility]) F/u Granuloma of skin; CT of chest completed 11/07/20. Stable slightly enlarged bilateral axillary lymph nodes compared to 08/23/2020; Stable small pulmonary nodule. No medication changes  10/29/2020: Oncology (Dr. Rogers Blocker - [facility]); CLL monitoring. Continue to hold Claquance / acalabrutinib; Ordered CT lungs and labs; Increased valacyclovir to 1 gram TID for 14 days and for patient to follow up with ID /  transplant team.  09/03/2020: Ortho (Dr Ninfa Linden)- OA of left knee; Discussed formal therapy to work on quad strengthening - pt deferred.  Therefore discussed knee friendly exercises / quad strengthening exercises. Discussed supplemental injection left knee since she is unable to do NSAIDs or cortisone. Patient will consider injeciton and will call Dr Trevor Mace office and let us know if she would like to proceed.   08/20/2020: Neurology (Dr Ubaldo Glassing, Amy) - Migraine HA f/u; No medication changes. May consider Ubrelvy or Nurtec if needed in the future. She will follow up in 1 year  Hospital visits: None in previous 6 months  Objective:  Lab Results  Component Value Date   CREATININE 0.7 08/12/2020   CREATININE 0.87 08/01/2020   CREATININE 0.81 02/15/2020    Lab Results  Component Value Date   HGBA1C 5.5 02/16/2019   Last diabetic Eye exam: No results found for: HMDIABEYEEXA  Last diabetic Foot exam: No results found for: HMDIABFOOTEX      Component Value Date/Time   CHOL 229 (H) 06/17/2020 1118   TRIG 240.0 (H) 06/17/2020 1118   HDL 73.10 06/17/2020 1118   CHOLHDL 3 06/17/2020 1118   VLDL 48.0 (H) 06/17/2020 1118   LDLCALC 103 (H) 04/27/2018 1403   LDLDIRECT 119.0 06/17/2020 1118    Hepatic Function Latest Ref Rng & Units 08/12/2020 08/01/2020 01/18/2020  Total Protein 6.0 - 8.3 g/dL - 6.9 6.5  Albumin 3.5 - 5.2 g/dL - 4.7 4.6  AST 13 - 35 16 13 13   ALT 7 - 35 11 10 7   Alk Phosphatase 25 - 125 51 67 54  Total Bilirubin 0.2 - 1.2 mg/dL - 0.4 0.4    Lab Results  Component Value Date/Time   TSH 5.82 08/12/2020 12:00 AM   TSH 2.84 08/01/2020 03:52 PM   TSH 4.65 (H) 06/17/2020 11:18 AM   FREET4 1.19 03/02/2018 03:24 PM   FREET4 0.65 04/17/2016 10:31 AM    CBC Latest Ref Rng & Units 08/12/2020 08/01/2020 02/15/2020  WBC - 8.3 3.9(L) 6.5  Hemoglobin 12.0 - 16.0 14.2 15.1(H) 13.9  Hematocrit 36 - 46 44 45.9 44.2  Platelets 150 - 399 259 211.0 256    Lab Results  Component  Value Date/Time   VD25OH 25.73 (L) 06/17/2020 11:18 AM   VD25OH 32.34 01/18/2020 02:51 PM    Clinical ASCVD: No  The 10-year ASCVD risk score Mikey Bussing DC Jr., et al., 2013) is: 1.5%   Values used to calculate the score:     Age: 35 years     Sex: Female     Is Non-Hispanic African American: No     Diabetic: No     Tobacco smoker: No     Systolic Blood Pressure: 761 mmHg     Is BP treated: No     HDL Cholesterol: 73.1 mg/dL     Total Cholesterol: 229 mg/dL     Social History   Tobacco Use  Smoking Status Former Smoker  . Types: Cigarettes  . Quit date: 07/20/1980  . Years since quitting: 40.3  Smokeless Tobacco Never Used  Tobacco Comment   Also exposed to second hand smoke as a child   BP Readings from Last 3 Encounters:  08/20/20 115/70  08/01/20 122/76  06/17/20 122/74   Pulse Readings from Last 3 Encounters:  08/20/20 77  08/01/20 86  06/17/20 84   Wt Readings from Last 3 Encounters:  08/20/20 187 lb (84.8 kg)  06/17/20 185 lb (83.9 kg)  04/22/20 180 lb (81.6 kg)    Assessment: Review of patient past medical history, allergies, medications, health status, including review of consultants reports, laboratory and other test data, was performed as part of comprehensive evaluation and provision of chronic care management services.   SDOH:  (Social Determinants of Health) assessments and interventions performed:  SDOH Interventions   Flowsheet Row Most Recent Value  SDOH Interventions   Physical Activity Interventions --  [Discussed increasing exercise - patient states limited by fatigue,  Appt will be made wiht PCP to evluate fatigue.]  Transportation Interventions Intervention Not Indicated      CCM Care Plan  Allergies  Allergen Reactions  . Aripiprazole Anaphylaxis and Swelling  . Ciprofloxacin Shortness Of Breath  . Lamotrigine Rash and Other (See Comments)  . Nitrofurantoin Hives  . Other Anaphylaxis, Hives and Swelling    Walnuts and pine nuts  .  Peanut Oil Anaphylaxis, Hives, Swelling and Rash  . Amoxicillin Hives and Rash    Has patient had a PCN reaction causing immediate rash, facial/tongue/throat swelling, SOB or lightheadedness with hypotension: Yes Has patient had a PCN reaction causing severe rash involving mucus membranes or skin necrosis: No Has patient had a PCN reaction that required hospitalization: No Has patient had a PCN reaction occurring within the last 10 years: No If all of the above answers are "NO", then may proceed with Cephalosporin use.  Marland Kitchen Doxycycline Hives and Rash  . Latex Rash and Other (See Comments)  . Meloxicam Itching and Other (See Comments)    Induces mania Interacts with Lithium  . Naproxen Hives and Other (See Comments)    Interacts with lithium  . Pramipexole Hives and Rash  . Prednisone Other (  See Comments)    Interacts with Lithium  . Risperidone Hives and Rash  . Sulfa Antibiotics Rash    "that leaves scarring"  . Tape Itching and Rash    Steri-StripsT  . Cortisone Other (See Comments)    Exacerbates the mania of her bipolar  . Percocet [Oxycodone-Acetaminophen] Other (See Comments)    Severe dizziness  . Shingrix [Zoster Vac Recomb Adjuvanted] Swelling    rash  . Vancomycin Itching    Itching on head and at IV insertion site where Vanc running    Medications Reviewed Today    Reviewed by [name], PharmD (Pharmacist) on 11/08/20 at [phone]  Med List Status: <None>  Medication Order Taking? Sig Documenting Provider Last Dose Status Informant  acalabrutinib (CALQUENCE) 100 MG capsule [id number] No Take 100 mg by mouth every 12 (twelve) hours.   Patient not taking: Reported on 11/08/2020   [provider] Not Taking Active Self           Med Note Antony Contras, Charmion Hapke B   Fri Nov 08, 2020  9:19 AM) Currently on hold  acetaminophen (TYLENOL) 500 MG tablet [id number] Yes Take 1,000 mg by mouth every 6 (six) hours as needed for mild pain. [provider] Taking Active Self   albuterol (VENTOLIN HFA) 108 (90 Base) MCG/ACT inhaler [id number] Yes Inhale 1-2 puffs into the lungs every 6 (six) hours as needed for wheezing or shortness of breath. Copland, Gay Filler, MD Taking Active Self  aspirin-acetaminophen-caffeine Jearld Adjutant EXTRA STRENGTH) [phone] MG tablet [id number] Yes Take 2 tablets by mouth every 6 (six) hours as needed for headache. [provider] Taking Active Self  diphenhydrAMINE (BENADRYL) 25 MG tablet [id number] Yes Take 1 tablet (25 mg total) by mouth every 6 (six) hours as needed for itching. Horton, Barbette Hair, MD Taking Active Self  EPINEPHrine 0.3 mg/0.3 mL IJ SOAJ injection [id number] No Inject 0.3 mLs (0.3 mg total) into the muscle as needed for anaphylaxis.  Patient not taking: Reported on 11/08/2020   Copland, Gay Filler, MD Not Taking Active Self  fluticasone furoate-vilanterol (BREO ELLIPTA) 100-25 MCG/INH AEPB [id number] Yes Inhale 1 puff into the lungs daily. Copland, Gay Filler, MD Taking Active Self  folic acid (FOLVITE) 1 MG tablet [id number] Yes Take 1 mg by mouth daily. [provider] Taking Active   hyoscyamine (LEVSIN) 0.125 MG tablet [id number] Yes Take 1 tablet (0.125 mg total) by mouth as needed for cramping. Reported on 09/24/2015 Copland, Gay Filler, MD Taking Active Self  Immune Globulin, Human, (CUVITRU) 10 GM/50ML SOLN [id number] Yes Inject 1 Dose into the skin once a week. [provider] Taking Active   levothyroxine (SYNTHROID) 25 MCG tablet [id number] Yes Take 1 tablet (25 mcg total) by mouth daily before breakfast. Copland, Gay Filler, MD Taking Active   ondansetron (ZOFRAN) 8 MG tablet [id number] No Take by mouth every 8 (eight) hours as needed for nausea or vomiting.  Patient not taking: Reported on 11/08/2020   [provider] Not Taking Active Self  rizatriptan (MAXALT-MLT) 10 MG disintegrating tablet [id number] Yes Take 1 tablet (10 mg total) by mouth as needed for migraine. May repeat in 2 hours if  needed Copland, Gay Filler, MD Taking Active   UNABLE TO FIND [id number] No Take 2.4 mg by mouth daily. Med Name: Lithium OTC  Patient not taking: Reported on 11/08/2020   [provider] Not Taking Active Self           Med Note Ebony Hail, AMBER B  Thu Feb 15, 2020  4:04 PM)    valACYclovir (VALTREX) 500 MG tablet [id number] Yes Take 500 mg by mouth 2 (two) times daily. [provider] Taking Active           Patient Active Problem List   Diagnosis Date Noted  . Throat discomfort 03/12/2020  . Hypothyroidism 11/18/2018  . Fever 06/18/2018  . Piriformis syndrome of right side 04/22/2017  . Lateral epicondylitis of left elbow 04/06/2017  . Solitary pulmonary nodule 10/29/2016  . Chronic lymphocytic leukemia ([place]) 07/31/2016  . Mass in neck 04/17/2016  . Abdominal mass 04/17/2016  . Asthma 12/12/2015  . Multiple environmental allergies 12/12/2015  . Chronic lower back pain 09/12/2015  . Bipolar 1 disorder, depressed ([place]) 08/27/2015    Class: Chronic  . Borderline personality disorder ([place]) 08/15/2015  . Severe benzodiazepine use disorder ([place]) 08/15/2015  . Alcohol use disorder, moderate, dependence ([place]) 08/15/2015  . PTSD (post-traumatic stress disorder) 08/15/2015  . History of bipolar disorder 08/15/2015  . Pre-syncope 05/17/2015  . Skull deformity 05/17/2015  . Patellofemoral syndrome of both knees 05/02/2015    Immunization History  Administered Date(s) Administered  . Influenza, Quadrivalent, Recombinant, Inj, Pf 04/28/2018  . Influenza,inj,Quad PF,6+ Mos 03/20/2019  . Influenza-Unspecified 04/15/2015, 05/19/2017  . Moderna Sars-Covid-2 Vaccination 10/03/2019, 11/03/2019, 06/08/2020  . Tdap 04/15/2015  . Zoster Recombinat (Shingrix) 02/23/2019    Conditions to be addressed/monitored: Bipolar Disorder, Pulmonary Disease and CLL; migraine headaches; hypothyroidism; GERD; pulmonary nodule; OA of knee  Care Plan : Wellness (Adult)  Updates made by  [name], PHARMD since 11/08/2020 12:00 AM    Problem: Medication Adherence (Wellness)   Priority: High  Onset Date: 11/08/2020    Long-Range Goal: Medication Adherence Maintained   This Visit's Progress: On track  Priority: High  Note:   Evidence-based guidance:   Develop a complete and accurate medication list including those prescribed and over-the-counter, those taken only occasionally and those not taken by mouth such as injections, inhalers, ointments or creams and drops.   Review all medications to determine if patient or caregiver knows why the medications are given and if taken as prescribed.   Complete or review a medication adherence assessment including barriers to medication adherence.   Assess barriers to medication adherence.   Manage poor understanding or health literacy by using easy to understand language, teach-back, visual aids and teaching only 2 or 3 points at a time.   Assess presence of side effects; provide suggestions to manage or reduce side effects.    Provide follow-up providing motivation, encouragement and support when medication nonadherence is identified.       Task: Optimize Medication Use   Note:   Care Management Activities:    - barriers to medication adherence identified - medication list compiled - medication list reviewed - medication-adherence assessment completed       Care Plan : General Pharmacy (Adult)  Updates made by [name], PHARMD since 11/08/2020 12:00 AM    Problem: Chronic Disease Management support, education, and care coordination needs related to Asthma, Hypothyroidism, Bipolar Disorder, Leukemia, Stomach Cramping, Recurrent Shingles, Migraine, Allergy     Long-Range Goal: Patient-Specific Goal   Start Date: 11/08/2020  This Visit's Progress: On track  Priority: High  Note:   Current Barriers:  . Chronic Disease Management support, education, and care coordination needs related to Asthma, Hypothyroidism,  Bipolar Disorder, Leukemia, Stomach Cramping, Recurrent Shingles, Migraine, Allergy  Pharmacist Clinical Goal(s):  Marland Kitchen Over the next 90 days, patient will  achieve adherence to monitoring guidelines and medication adherence to achieve therapeutic efficacy through collaboration with PharmD and provider.   Interventions: . 1:1 collaboration with Copland, Gay Filler, MD regarding development and update of comprehensive plan of care as evidenced by provider attestation and co-signature . Inter-disciplinary care team collaboration (see longitudinal plan of care) . Comprehensive medication review performed; medication list updated in electronic medical record  Asthma: . Controlled . Current regimen:  o Albuterol inhaler (ProAir) as needed o Breo Ellipta 100-32mg 1 puff daily . Patient reports she will take an extra dose of BMemory Danceis she awakes with shortness of breath. Does this about twice per month . Patient reports using albuterol about once daily or every other day the last month due to allergies / pollen. However she usually only need to use 1 or 2 times per week. . Interventions:  o Education provided regarding maintenance versus rescue inhaler and when to use.  o Reviewed inhaler use / technique o Reminded patient that BMemory Danceis maintenance inhaler - use every day; Albuterol is rescue inhaler and is used when you have shortness of breath or wheezing. o Reminded patient to rinse mouth after using Breo to prevent infection / thrush.   Bipolar 1: . Current regimen:  o No current pharmacotherapy  o Counseling . Patient use to take an OTC lithium 257mas needed but reports today that she has not needed in several months.  . [facility] and also has weekly video / telephone sessions with counselor and chaplain with [facility]  . Interventions: o Encouraged continued follow up with counselors and spiritual care  Migraine  Headaches . Improving - on average has 1 headaches per week.  . Current regimen:  o Rizatriptan 1061ms needed o Excedrin migraine as needed . Interventions: o Recommended maintain migraine medication regimen and follow up as planned with neurologist  Hypothyroidism . Current regimen:  o Levothyroxine 53m38maily . Patient does report increase in fatigue recently. She feels that she notices more after she eats and wonders if her BG is high. Would like to have an A1c checked.  . She also mentioned that she has had cramping in her toes and burning in her feet over the last 2 weeks.  . Interventions: o Recommended follow up with PCP due to increase in fatigue and burning in feet - sent message to scheduler o Maintain thyroid medication regimen   Acid Reflux / GERD:  . Improving since patient restarted pantoprazole (on own) . Current regimen:  o Pantoprazole 40mg14mly (had been on hold while taking Calquence) o Hyoscamine 0.153mg 67mke as needed for stomach cramping . Interventions: o Reviewed interaction between Calquence and pantoprazole o Ok to continue pantoprazole while Calquence is on hold but will need alternative for reflux if Calquence is restarted.  o Make sure oncology team is aware you have restarted pantoprazole.   Medication management . Current pharmacy: [facility] . Interventions o Comprehensive medication review performed. Updated medication list o Reviewed refill history and adherence o Continue current medication management strategy  Health Maintenance  Patient is unable to take second dose of Shingrix due to arms swelling with first dose.   Patient Goals/Self-Care Activities . Over the next 90 days, patient will:  take medications as prescribed and target a minimum of 150 minutes of moderate intensity exercise weekly (will hold off on exercise until evaluation of fatigue and feet burning by PCP)  Follow Up Plan:  Telephone follow up appointment with care  management team member scheduled for:  3 to 4 months          Medication Assistance: None required.  Patient affirms current coverage meets needs.  Patient's preferred pharmacy is:  [facility] [place], [place] - [address] [place] [address] Phone: [phone] Fax: [place] #[id number] [place], [place] - [address] AT [facility] [address]  [address] Phone: [phone] Fax: [phone]  [id number] [place] AT [address] [place] [address] Phone: [phone] Fax: [phone]  Uses pill box? No -   Pt endorses 95% compliance  Follow Up: o Education provided regarding maintenance versus rescue inhaler and when to use.  o Reviewed interaction between Calquence and pantoprazole. Ok to continue pantoprazole while Calquence is on hold but will need alternative for reflux if Calquence is restarted. o Patient reported increase in fatigue recently. She feels that she notices more after she eats and wonders if her BG is high. Also c/o burning in feet and toes cramping. Pt asked about having A1c checked. Recommended follow up with PCP due to increase in fatigue and burning in feet - sent message to scheduler    Patient agrees to Care Plan and Follow-up.  Plan: Telephone follow up appointment with care management team member scheduled for:   6 months  [name], PharmD Clinical Pharmacist [place] [facility]

## 2020-11-11 DIAGNOSIS — D801 Nonfamilial hypogammaglobulinemia: Secondary | ICD-10-CM | POA: Diagnosis not present

## 2020-11-12 ENCOUNTER — Encounter: Payer: Self-pay | Admitting: General Practice

## 2020-11-12 NOTE — Progress Notes (Signed)
[facility] Spiritual Care Note  Set Spiritual Care Virtual Visit for Friday 4/29 at 3pm per Ladaija's request.   Chaplain [name], [place], [facility] Pager [phone] Voicemail [phone]

## 2020-11-13 NOTE — Telephone Encounter (Signed)
appt was made 11/11/20 for 11/20/20

## 2020-11-15 ENCOUNTER — Encounter: Payer: Self-pay | Admitting: General Practice

## 2020-11-15 DIAGNOSIS — L929 Granulomatous disorder of the skin and subcutaneous tissue, unspecified: Secondary | ICD-10-CM | POA: Diagnosis not present

## 2020-11-15 DIAGNOSIS — C9110 Chronic lymphocytic leukemia of B-cell type not having achieved remission: Secondary | ICD-10-CM | POA: Diagnosis not present

## 2020-11-15 NOTE — Progress Notes (Signed)
[place] Spiritual Care Note  Had Spiritual Care Virtual Visit via Webex as planned for Morgan [name] to process updates from her recent infectious disease appointment. She is experiencing physical fatigue as well as emotional fatigue at having to work so hard to try to discover what is going on in her body to cause her symptoms (including the swollen lymph nodes and ongoing shingles, among others). We plan to follow up again as needed.   [place], [place], [facility] Pager [phone] Voicemail [phone]

## 2020-11-18 DIAGNOSIS — D801 Nonfamilial hypogammaglobulinemia: Secondary | ICD-10-CM | POA: Diagnosis not present

## 2020-11-18 DIAGNOSIS — C9110 Chronic lymphocytic leukemia of B-cell type not having achieved remission: Secondary | ICD-10-CM | POA: Diagnosis not present

## 2020-11-18 DIAGNOSIS — L929 Granulomatous disorder of the skin and subcutaneous tissue, unspecified: Secondary | ICD-10-CM | POA: Diagnosis not present

## 2020-11-19 NOTE — Progress Notes (Addendum)
[facility] [place], [place], [place] [address] [phone]  Date:  11/20/2020   Name:  Morgan [name]   DOB:  03-21-1966   MRN:  [id number]  PCP:  Darreld Mclean, MD    Chief Complaint: Follow-up and Medical Management of Chronic Issues (F/u )   History of Present Illness:  Morgan [name] is a 55 y.o. very pleasant female patient who presents with the following:  Patient seen today for a follow-up visit- she would like to check her A1c  Lab Results  Component Value Date   HGBA1C 5.5 02/16/2019   Last visit with myself-seen virtually in January History of CLL, mood disorder, hypothyroidism  She was seen earlier this week by her oncologist with [facility]: ASSESSMENT Morgan [name] is a 55 yo woman w/ CLL who is currently off of therapy and presents today for help in managing recurrent HZ # Recurrent HZ: Lesions appear burnt out and inactive at this time. I do not think we have proven treatment or prophylaxis failure in Morgan [name]'s case. I think the failure of prophylaxis is due to noncompliance and encouraged her today to resume prophylactic valacyclovir. If we are able to demonstrate prophylaxis or treatment failure, the next step would be for further diagnostic w/u. I think resistant VZV is unlikely. There are very few cases reported in the literature. I think more likely is the possibility that this is not VZV. If she recurs on valacyclovir, we should repeat VZV/HSV PCR to ensure this is not HSV (which has much higher rates of resistance).  # Cervical LAD: The differential diagnosis in this case is broad and includes infection as well as non-infectious entities. Unfortunately, the pathological specimen has been fixed, so culture cannot be performed. I discussed the yield of broad range PCR with our pathology and microbiology colleagues, and it seems like this would be low yield under the circumstances. I think our only  option would be to obtain additional tissue for culture. We discussed this, and Morgan [name] is not prepared for additional invasive testing at this time, preferring instead to take a watchful waiting approach. I think this is reasonable. We sent fungal cultures from the blood as well as other diagnostic tests which have been negative. It would be a good idea to check AFB blood cultures, which I have ordered to be collected with her next set of labs. We can repeat a PET scan in several months (will defer to oncology re: timing) and if there are other targets that may be biopsied, we can readdress it with the patient at that time. RECOMMENDATIONS:  - Continue valacyclovir prophylaxis - Continue SubQ Ig - Obtain AFB BCx w/ next lab draw - Repeat PET when indicated from oncology perspective and consider repeat biopsy at that time - ID f/u in 3 mo  She notes that she may feel hot and cold, and has been fatigued.  She wanted to make sure her A1c and thyroid are ok which we can certainly do She has noted some diarrhea recently as well  Her cancer is under good control for now She did have a lymph node bx on 4/1 at [facility] and this showed improvement   CBC and CMP done at [facility]- looked ok Weight is stable to up slightly   No vomitng No rectal bleeding She noted yellow/ brown stools  Wt Readings from Last 3 Encounters:  11/20/20 188 lb 3.2 oz (85.4  kg)  08/20/20 187 lb (84.8 kg)  06/17/20 185 lb (83.9 kg)    Patient Active Problem List   Diagnosis Date Noted  . Throat discomfort 03/12/2020  . Hypothyroidism 11/18/2018  . Fever 06/18/2018  . Piriformis syndrome of right side 04/22/2017  . Lateral epicondylitis of left elbow 04/06/2017  . Solitary pulmonary nodule 10/29/2016  . Chronic lymphocytic leukemia ([place]) 07/31/2016  . Mass in neck 04/17/2016  . Abdominal mass 04/17/2016  . Asthma 12/12/2015  . Multiple environmental allergies 12/12/2015  . Chronic lower back pain 09/12/2015  .  Bipolar 1 disorder, depressed ([place]) 08/27/2015    Class: Chronic  . Borderline personality disorder ([place]) 08/15/2015  . Severe benzodiazepine use disorder ([place]) 08/15/2015  . Alcohol use disorder, moderate, dependence ([place]) 08/15/2015  . PTSD (post-traumatic stress disorder) 08/15/2015  . History of bipolar disorder 08/15/2015  . Pre-syncope 05/17/2015  . Skull deformity 05/17/2015  . Patellofemoral syndrome of both knees 05/02/2015    Past Medical History:  Diagnosis Date  . Anxiety   . Arthritis   . Asthma   . Bipolar disorder ([place])   . Bulging lumbar disc 07/19/05  . Chronic headaches   . Chronic lymphocytic leukemia ([place]) 07/31/2016  . Colon polyps   . Degenerative disorder of bone   . Depression   . History of borderline personality disorder   . Hypothyroidism 2007   Developed after use of Lithium  . IBS (irritable bowel syndrome) 1995  . Pneumonia   . Proctitis   . Shingles 07/2018  . Substance abuse ([place])    ativan last month    Past Surgical History:  Procedure Laterality Date  . ABDOMINAL HYSTERECTOMY    . APPENDECTOMY    . BUNIONECTOMY Right 06/2012  . CHOLECYSTECTOMY    . OOPHORECTOMY    . SHOULDER OPEN ROTATOR CUFF REPAIR Right 03/2010  . TUBAL LIGATION      Social History   Tobacco Use  . Smoking status: Former Smoker    Types: Cigarettes    Quit date: 07/20/1980    Years since quitting: 40.3  . Smokeless tobacco: Never Used  . Tobacco comment: Also exposed to second hand smoke as a child  Vaping Use  . Vaping Use: Never used  Substance Use Topics  . Alcohol use: Yes    Alcohol/week: 0.0 standard drinks    Comment: occ  . Drug use: Not Currently    Family History  Problem Relation Age of Onset  . Depression Mother   . Hypertension Mother   . Post-traumatic stress disorder Sister   . Alcohol abuse Brother   . Drug abuse Brother   . Post-traumatic stress disorder Brother   . Thyroid disease Father   . Alzheimer's disease Father   .  COPD Maternal Grandmother   . Heart disease Maternal Grandfather   . Arthritis Paternal Grandmother   . Diabetes Paternal Grandmother   . Depression Paternal Grandmother   . Alzheimer's disease Paternal Grandmother   . Heart disease Paternal Grandfather   . Coronary artery disease Paternal Grandfather   . Alcohol abuse Paternal Grandfather   . Colon cancer Neg Hx   . Breast cancer Neg Hx     Allergies  Allergen Reactions  . Aripiprazole Anaphylaxis and Swelling  . Ciprofloxacin Shortness Of Breath  . Lamotrigine Rash and Other (See Comments)  . Nitrofurantoin Hives  . Other Anaphylaxis, Hives and Swelling    Walnuts and pine nuts  . Peanut Oil Anaphylaxis, Hives, Swelling and  Rash  . Amoxicillin Hives and Rash    Has patient had a PCN reaction causing immediate rash, facial/tongue/throat swelling, SOB or lightheadedness with hypotension: Yes Has patient had a PCN reaction causing severe rash involving mucus membranes or skin necrosis: No Has patient had a PCN reaction that required hospitalization: No Has patient had a PCN reaction occurring within the last 10 years: No If all of the above answers are "NO", then may proceed with Cephalosporin use.  Marland Kitchen Doxycycline Hives and Rash  . Latex Rash and Other (See Comments)  . Meloxicam Itching and Other (See Comments)    Induces mania Interacts with Lithium  . Naproxen Hives and Other (See Comments)    Interacts with lithium  . Pramipexole Hives and Rash  . Prednisone Other (See Comments)    Interacts with Lithium  . Risperidone Hives and Rash  . Sulfa Antibiotics Rash    "that leaves scarring"  . Tape Itching and Rash    Steri-StripsT  . Cortisone Other (See Comments)    Exacerbates the mania of her bipolar  . Percocet [Oxycodone-Acetaminophen] Other (See Comments)    Severe dizziness  . Shingrix [Zoster Vac Recomb Adjuvanted] Swelling    rash  . Vancomycin Itching    Itching on head and at IV insertion site where Vanc  running    Medication list has been reviewed and updated.  Current Outpatient Medications on File Prior to Visit  Medication Sig Dispense Refill  . acetaminophen (TYLENOL) 500 MG tablet Take 1,000 mg by mouth every 6 (six) hours as needed for mild pain.    Marland Kitchen albuterol (VENTOLIN HFA) 108 (90 Base) MCG/ACT inhaler Inhale 1-2 puffs into the lungs every 6 (six) hours as needed for wheezing or shortness of breath. 18 g 6  . aspirin-acetaminophen-caffeine (EXCEDRIN EXTRA STRENGTH) 250-250-65 MG tablet Take 2 tablets by mouth every 6 (six) hours as needed for headache.    . diphenhydrAMINE (BENADRYL) 25 MG tablet Take 1 tablet (25 mg total) by mouth every 6 (six) hours as needed for itching. 20 tablet 0  . EPINEPHrine 0.3 mg/0.3 mL IJ SOAJ injection Inject 0.3 mLs (0.3 mg total) into the muscle as needed for anaphylaxis. 1 each prn  . fluticasone furoate-vilanterol (BREO ELLIPTA) 100-25 MCG/INH AEPB Inhale 1 puff into the lungs daily. 1 each 11  . folic acid (FOLVITE) 1 MG tablet Take 1 mg by mouth daily.    . hyoscyamine (LEVSIN) 0.125 MG tablet Take 1 tablet (0.125 mg total) by mouth as needed for cramping. Reported on 09/24/2015 10 tablet 2  . Immune Globulin, Human, (CUVITRU) 10 GM/50ML SOLN Inject 1 Dose into the skin once a week.    . levothyroxine (SYNTHROID) 25 MCG tablet Take 1 tablet (25 mcg total) by mouth daily before breakfast. 90 tablet 1  . ondansetron (ZOFRAN) 8 MG tablet Take by mouth every 8 (eight) hours as needed for nausea or vomiting.    . pantoprazole (PROTONIX) 40 MG tablet Take 40 mg by mouth daily.    . rizatriptan (MAXALT-MLT) 10 MG disintegrating tablet Take 1 tablet (10 mg total) by mouth as needed for migraine. May repeat in 2 hours if needed 9 tablet 11  . UNABLE TO FIND Take 2.4 mg by mouth daily. Med Name: Lithium OTC    . valACYclovir (VALTREX) 500 MG tablet Take 500 mg by mouth 2 (two) times daily.    Marland Kitchen acalabrutinib (CALQUENCE) 100 MG capsule Take 100 mg by mouth  every 12 (twelve) hours.  (  Patient not taking: Reported on 11/20/2020)     No current facility-administered medications on file prior to visit.    Review of Systems:  As per HPI- otherwise negative.   Physical Examination: Vitals:   11/20/20 1300  BP: 124/90  Pulse: 83  Temp: 98.9 F (37.2 C)  SpO2: 99%   Vitals:   11/20/20 1300  Weight: 188 lb 3.2 oz (85.4 kg)  Height: 5\' 3"  (1.6 m)   Body mass index is 33.34 kg/m. Ideal Body Weight: Weight in (lb) to have BMI = 25: 140.8  GEN: no acute distress. Obese, looks well  HEENT: Atraumatic, Normocephalic.  Ears and Nose: No external deformity. CV: RRR, No M/G/R. No JVD. No thrill. No extra heart sounds. PULM: CTA B, no wheezes, crackles, rhonchi. No retractions. No resp. distress. No accessory muscle use. ABD: S, NT, ND, +BS. No rebound. No HSM. EXTR: No c/c/e PSYCH: Normally interactive. Conversant.    Assessment and Plan: Acquired hypothyroidism - Plan: TSH  Migraine without status migrainosus, not intractable, unspecified migraine type  Chronic lymphocytic leukemia ([place])  Screening for diabetes mellitus - Plan: Hemoglobin A1c  Vitamin D deficiency - Plan: VITAMIN D 25 Hydroxy (Vit-D Deficiency, Fractures)  Diarrhea, unspecified type - Plan: Clostridium Difficile by PCR([facility]), Stool Culture  Pt here today with a few concerns- feeling hot and cold, concerned about her thyroid, and also intermittent diarrhea for about a month Labs pending as above Check vitamin D level Stool cultures She is asked to let me know if sx change or worsen Need thyroid refill once TSH comes in  6 month visit assuming all is well  This visit occurred during the SARS-CoV-2 public health emergency.  Safety protocols were in place, including screening questions prior to the visit, additional usage of staff PPE, and extensive cleaning of exam room while observing appropriate contact time as indicated for disinfecting solutions.     Signed Lamar Blinks, MD  Addendum 5/5, received labs as below  Results for orders placed or performed in visit on 11/20/20  TSH  Result Value Ref Range   TSH 8.25 (H) 0.35 - 4.50 uIU/mL  Hemoglobin A1c  Result Value Ref Range   Hgb A1c MFr Bld 5.1 4.6 - 6.5 %  VITAMIN D 25 Hydroxy (Vit-D Deficiency, Fractures)  Result Value Ref Range   VITD 43.91 30.00 - 100.00 ng/mL   TSH is elevated, will adjust levothyroxine She is currently on 25 mcg, will increase to 50

## 2020-11-20 ENCOUNTER — Encounter: Payer: Self-pay | Admitting: Family Medicine

## 2020-11-20 ENCOUNTER — Ambulatory Visit (INDEPENDENT_AMBULATORY_CARE_PROVIDER_SITE_OTHER): Payer: Medicare Other | Admitting: Family Medicine

## 2020-11-20 ENCOUNTER — Other Ambulatory Visit: Payer: Self-pay

## 2020-11-20 VITALS — BP 124/90 | HR 83 | Temp 98.9°F | Ht 63.0 in | Wt 188.2 lb

## 2020-11-20 DIAGNOSIS — Z131 Encounter for screening for diabetes mellitus: Secondary | ICD-10-CM | POA: Diagnosis not present

## 2020-11-20 DIAGNOSIS — R197 Diarrhea, unspecified: Secondary | ICD-10-CM | POA: Diagnosis not present

## 2020-11-20 DIAGNOSIS — C9110 Chronic lymphocytic leukemia of B-cell type not having achieved remission: Secondary | ICD-10-CM | POA: Diagnosis not present

## 2020-11-20 DIAGNOSIS — E559 Vitamin D deficiency, unspecified: Secondary | ICD-10-CM

## 2020-11-20 DIAGNOSIS — E039 Hypothyroidism, unspecified: Secondary | ICD-10-CM

## 2020-11-20 DIAGNOSIS — G43909 Migraine, unspecified, not intractable, without status migrainosus: Secondary | ICD-10-CM

## 2020-11-20 NOTE — Addendum Note (Signed)
Addended by: Kelle Darting A on: 11/20/2020 01:27 PM   Modules accepted: Orders

## 2020-11-20 NOTE — Patient Instructions (Addendum)
I will be in touch with your labs and stool studies asap Ok to use imodium as needed for diarrhea  Please let me know if anything is changing or getting worse!

## 2020-11-21 ENCOUNTER — Encounter: Payer: Self-pay | Admitting: Family Medicine

## 2020-11-21 LAB — HEMOGLOBIN A1C: Hgb A1c MFr Bld: 5.1 % (ref 4.6–6.5)

## 2020-11-21 LAB — VITAMIN D 25 HYDROXY (VIT D DEFICIENCY, FRACTURES): VITD: 43.91 ng/mL (ref 30.00–100.00)

## 2020-11-21 LAB — TSH: TSH: 8.25 u[IU]/mL — ABNORMAL HIGH (ref 0.35–4.50)

## 2020-11-21 MED ORDER — LEVOTHYROXINE SODIUM 50 MCG PO TABS: 50.0000 ug | ORAL_TABLET | Freq: Every day | ORAL | 5 refills | Status: DC

## 2020-11-21 NOTE — Addendum Note (Signed)
Addended by: Lamar Blinks C on: 11/21/2020 07:17 PM   Modules accepted: Orders

## 2020-11-25 DIAGNOSIS — D801 Nonfamilial hypogammaglobulinemia: Secondary | ICD-10-CM | POA: Diagnosis not present

## 2020-12-02 DIAGNOSIS — D801 Nonfamilial hypogammaglobulinemia: Secondary | ICD-10-CM | POA: Diagnosis not present

## 2020-12-05 DIAGNOSIS — D839 Common variable immunodeficiency, unspecified: Secondary | ICD-10-CM | POA: Diagnosis not present

## 2020-12-05 DIAGNOSIS — D801 Nonfamilial hypogammaglobulinemia: Secondary | ICD-10-CM | POA: Diagnosis not present

## 2020-12-16 DIAGNOSIS — D801 Nonfamilial hypogammaglobulinemia: Secondary | ICD-10-CM | POA: Diagnosis not present

## 2020-12-20 ENCOUNTER — Encounter: Payer: Self-pay | Admitting: General Practice

## 2020-12-20 NOTE — Progress Notes (Signed)
[facility] Spiritual Care Note  Had Spiritual Care Virtual Visit with Maeryn to make space for her to process recent health issues and social frustrations, which are affecting her faith morale and spiritual practices. Provided empathic listening and emotional support. We plan to follow up by [facility] on 6/13.   [place], [place], [facility] Pager [phone] Voicemail [phone]

## 2020-12-23 ENCOUNTER — Encounter: Payer: Self-pay | Admitting: General Practice

## 2020-12-23 DIAGNOSIS — R911 Solitary pulmonary nodule: Secondary | ICD-10-CM | POA: Diagnosis not present

## 2020-12-23 DIAGNOSIS — R590 Localized enlarged lymph nodes: Secondary | ICD-10-CM | POA: Diagnosis not present

## 2020-12-23 DIAGNOSIS — C9110 Chronic lymphocytic leukemia of B-cell type not having achieved remission: Secondary | ICD-10-CM | POA: Diagnosis not present

## 2020-12-23 DIAGNOSIS — R3129 Other microscopic hematuria: Secondary | ICD-10-CM | POA: Diagnosis not present

## 2020-12-23 DIAGNOSIS — D801 Nonfamilial hypogammaglobulinemia: Secondary | ICD-10-CM | POA: Diagnosis not present

## 2020-12-23 NOTE — Progress Notes (Signed)
[facility] Spiritual Care Note  Received call from [facility] to process distress at a scheduling error at [facility]. She is so eager for her next dose of Evusheld for the extra protection it should afford because she feels a strong spiritual need to be able to attend church services again. [place] well and reports complementary support from counselor and another support person at [place].   [place], [place], [facility] Pager [phone] Voicemail [phone]

## 2020-12-24 ENCOUNTER — Other Ambulatory Visit: Payer: Self-pay

## 2020-12-24 ENCOUNTER — Emergency Department (HOSPITAL_BASED_OUTPATIENT_CLINIC_OR_DEPARTMENT_OTHER)
Admission: EM | Admit: 2020-12-24 | Discharge: 2020-12-24 | Disposition: A | Discharge status: Home / Self Care | Payer: Medicare Other | Attending: Emergency Medicine | Admitting: Emergency Medicine

## 2020-12-24 ENCOUNTER — Emergency Department (HOSPITAL_BASED_OUTPATIENT_CLINIC_OR_DEPARTMENT_OTHER): Payer: Medicare Other

## 2020-12-24 ENCOUNTER — Encounter (HOSPITAL_BASED_OUTPATIENT_CLINIC_OR_DEPARTMENT_OTHER): Payer: Self-pay | Admitting: Emergency Medicine

## 2020-12-24 DIAGNOSIS — R202 Paresthesia of skin: Secondary | ICD-10-CM | POA: Insufficient documentation

## 2020-12-24 DIAGNOSIS — R1012 Left upper quadrant pain: Secondary | ICD-10-CM

## 2020-12-24 DIAGNOSIS — R590 Localized enlarged lymph nodes: Secondary | ICD-10-CM | POA: Diagnosis not present

## 2020-12-24 DIAGNOSIS — Q048 Other specified congenital malformations of brain: Secondary | ICD-10-CM | POA: Diagnosis not present

## 2020-12-24 DIAGNOSIS — K7689 Other specified diseases of liver: Secondary | ICD-10-CM | POA: Diagnosis not present

## 2020-12-24 DIAGNOSIS — R450 Nervousness: Secondary | ICD-10-CM | POA: Diagnosis not present

## 2020-12-24 DIAGNOSIS — Z91040 Latex allergy status: Secondary | ICD-10-CM | POA: Insufficient documentation

## 2020-12-24 DIAGNOSIS — Z9049 Acquired absence of other specified parts of digestive tract: Secondary | ICD-10-CM | POA: Diagnosis not present

## 2020-12-24 DIAGNOSIS — Z452 Encounter for adjustment and management of vascular access device: Secondary | ICD-10-CM | POA: Diagnosis not present

## 2020-12-24 DIAGNOSIS — Z87891 Personal history of nicotine dependence: Secondary | ICD-10-CM | POA: Diagnosis not present

## 2020-12-24 DIAGNOSIS — R519 Headache, unspecified: Secondary | ICD-10-CM | POA: Diagnosis not present

## 2020-12-24 DIAGNOSIS — E039 Hypothyroidism, unspecified: Secondary | ICD-10-CM | POA: Diagnosis not present

## 2020-12-24 DIAGNOSIS — J45909 Unspecified asthma, uncomplicated: Secondary | ICD-10-CM | POA: Insufficient documentation

## 2020-12-24 DIAGNOSIS — Z959 Presence of cardiac and vascular implant and graft, unspecified: Secondary | ICD-10-CM | POA: Diagnosis not present

## 2020-12-24 LAB — CBC WITH DIFFERENTIAL/PLATELET
Abs Immature Granulocytes: 0.01 10*3/uL (ref 0.00–0.07)
Basophils Absolute: 0.1 10*3/uL (ref 0.0–0.1)
Basophils Relative: 2 %
Eosinophils Absolute: 0.5 10*3/uL (ref 0.0–0.5)
Eosinophils Relative: 10 %
HCT: 44.9 % (ref 36.0–46.0)
Hemoglobin: 15 g/dL (ref 12.0–15.0)
Immature Granulocytes: 0 %
Lymphocytes Relative: 27 %
Lymphs Abs: 1.5 10*3/uL (ref 0.7–4.0)
MCH: 28 pg (ref 26.0–34.0)
MCHC: 33.4 g/dL (ref 30.0–36.0)
MCV: 83.9 fL (ref 80.0–100.0)
Monocytes Absolute: 0.6 10*3/uL (ref 0.1–1.0)
Monocytes Relative: 12 %
Neutro Abs: 2.7 10*3/uL (ref 1.7–7.7)
Neutrophils Relative %: 49 %
Platelets: 251 10*3/uL (ref 150–400)
RBC: 5.35 MIL/uL — ABNORMAL HIGH (ref 3.87–5.11)
RDW: 12.8 % (ref 11.5–15.5)
WBC: 5.5 10*3/uL (ref 4.0–10.5)
nRBC: 0 % (ref 0.0–0.2)

## 2020-12-24 LAB — COMPREHENSIVE METABOLIC PANEL
ALT: 12 U/L (ref 0–44)
AST: 22 U/L (ref 15–41)
Albumin: 4.1 g/dL (ref 3.5–5.0)
Alkaline Phosphatase: 66 U/L (ref 38–126)
Anion gap: 8 (ref 5–15)
BUN: 14 mg/dL (ref 6–20)
CO2: 27 mmol/L (ref 22–32)
Calcium: 9.4 mg/dL (ref 8.9–10.3)
Chloride: 104 mmol/L (ref 98–111)
Creatinine, Ser: 0.81 mg/dL (ref 0.44–1.00)
GFR, Estimated: >60 mL/min (ref >60)
Glucose, Bld: 91 mg/dL (ref 70–99)
Potassium: 3.8 mmol/L (ref 3.5–5.1)
Sodium: 139 mmol/L (ref 135–145)
Total Bilirubin: 0.4 mg/dL (ref 0.3–1.2)
Total Protein: 7.3 g/dL (ref 6.5–8.1)

## 2020-12-24 LAB — LIPASE, BLOOD: Lipase: 66 U/L — ABNORMAL HIGH (ref 11–51)

## 2020-12-24 LAB — TROPONIN I (HIGH SENSITIVITY): Troponin I (High Sensitivity): <2 ng/L (ref <18)

## 2020-12-24 MED ORDER — FENTANYL CITRATE (PF) 100 MCG/2ML IJ SOLN
50.0000 ug | Freq: Once | INTRAMUSCULAR | Status: DC
  Filled 2020-12-24: qty 2

## 2020-12-24 NOTE — ED Triage Notes (Addendum)
Pt presents to ED BIB [facility] from home. Pt c/o L HA, L eye, L neck pain. Pt reports that pain was all over to begin with. Tylenol alleviated some of pain. Pt reports she had double dose of medication evusheld yesterday at [facility]

## 2020-12-24 NOTE — ED Provider Notes (Signed)
[facility] EMERGENCY DEPARTMENT Provider Note   CSN: [id number] Arrival date & time: 12/24/20  0205     History Chief Complaint  Patient presents with  . Headache    Morgan [name] is a 55 y.o. female.   Headache Pain location:  Generalized Quality:  Dull Radiates to:  Does not radiate Severity currently:  5/10 Onset quality:  Gradual Timing:  Constant Progression:  Worsening Chronicity:  Recurrent Similar to prior headaches: yes   Context: activity   Associated symptoms: abdominal pain and numbness (paresthesia in left face)   Associated symptoms: no seizures        Past Medical History:  Diagnosis Date  . Anxiety   . Arthritis   . Asthma   . Bipolar disorder ([place])   . Bulging lumbar disc 07/19/05  . Chronic headaches   . Chronic lymphocytic leukemia ([place]) 07/31/2016  . Colon polyps   . Degenerative disorder of bone   . Depression   . History of borderline personality disorder   . Hypothyroidism 2007   Developed after use of Lithium  . IBS (irritable bowel syndrome) 1995  . Pneumonia   . Proctitis   . Shingles 07/2018  . Substance abuse ([place])    ativan last month    Patient Active Problem List   Diagnosis Date Noted  . Throat discomfort 03/12/2020  . Hypothyroidism 11/18/2018  . Fever 06/18/2018  . Piriformis syndrome of right side 04/22/2017  . Lateral epicondylitis of left elbow 04/06/2017  . Solitary pulmonary nodule 10/29/2016  . Chronic lymphocytic leukemia ([place]) 07/31/2016  . Mass in neck 04/17/2016  . Abdominal mass 04/17/2016  . Asthma 12/12/2015  . Multiple environmental allergies 12/12/2015  . Chronic lower back pain 09/12/2015  . Bipolar 1 disorder, depressed ([place]) 08/27/2015    Class: Chronic  . Borderline personality disorder ([place]) 08/15/2015  . Severe benzodiazepine use disorder ([place]) 08/15/2015  . Alcohol use disorder, moderate, dependence ([place]) 08/15/2015  . PTSD (post-traumatic stress disorder) 08/15/2015  .  History of bipolar disorder 08/15/2015  . Pre-syncope 05/17/2015  . Skull deformity 05/17/2015  . Patellofemoral syndrome of both knees 05/02/2015    Past Surgical History:  Procedure Laterality Date  . ABDOMINAL HYSTERECTOMY    . APPENDECTOMY    . BUNIONECTOMY Right 06/2012  . CHOLECYSTECTOMY    . OOPHORECTOMY    . SHOULDER OPEN ROTATOR CUFF REPAIR Right 03/2010  . TUBAL LIGATION       OB History    Gravida  2   Para  2   Term  2   Preterm  0   AB  0   Living  2     SAB  0   IAB  0   Ectopic  0   Multiple  0   Live Births              Family History  Problem Relation Age of Onset  . Depression Mother   . Hypertension Mother   . Post-traumatic stress disorder Sister   . Alcohol abuse Brother   . Drug abuse Brother   . Post-traumatic stress disorder Brother   . Thyroid disease Father   . Alzheimer's disease Father   . COPD Maternal Grandmother   . Heart disease Maternal Grandfather   . Arthritis Paternal Grandmother   . Diabetes Paternal Grandmother   . Depression Paternal Grandmother   . Alzheimer's disease Paternal Grandmother   . Heart disease Paternal Grandfather   . Coronary artery disease  Paternal Grandfather   . Alcohol abuse Paternal Grandfather   . Colon cancer Neg Hx   . Breast cancer Neg Hx     Social History   Tobacco Use  . Smoking status: Former Smoker    Types: Cigarettes    Quit date: 07/20/1980    Years since quitting: 40.4  . Smokeless tobacco: Never Used  . Tobacco comment: Also exposed to second hand smoke as a child  Vaping Use  . Vaping Use: Never used  Substance Use Topics  . Alcohol use: Yes    Alcohol/week: 0.0 standard drinks    Comment: occ  . Drug use: Not Currently    Home Medications Prior to Admission medications   Medication Sig Start Date End Date Taking? Authorizing Provider  acalabrutinib (CALQUENCE) 100 MG capsule Take 100 mg by mouth every 12 (twelve) hours.  Patient not taking: Reported on  11/20/2020    [provider]  acetaminophen (TYLENOL) 500 MG tablet Take 1,000 mg by mouth every 6 (six) hours as needed for mild pain.    [provider]  albuterol (VENTOLIN HFA) 108 (90 Base) MCG/ACT inhaler Inhale 1-2 puffs into the lungs every 6 (six) hours as needed for wheezing or shortness of breath. 01/04/20   Copland, Gay Filler, MD  aspirin-acetaminophen-caffeine (EXCEDRIN EXTRA STRENGTH) [phone] MG tablet Take 2 tablets by mouth every 6 (six) hours as needed for headache.    [provider]  diphenhydrAMINE (BENADRYL) 25 MG tablet Take 1 tablet (25 mg total) by mouth every 6 (six) hours as needed for itching. 02/27/19   Horton, Barbette Hair, MD  EPINEPHrine 0.3 mg/0.3 mL IJ SOAJ injection Inject 0.3 mLs (0.3 mg total) into the muscle as needed for anaphylaxis. 01/04/20   Copland, Gay Filler, MD  fluticasone furoate-vilanterol (BREO ELLIPTA) 100-25 MCG/INH AEPB Inhale 1 puff into the lungs daily. 01/04/20   Copland, Gay Filler, MD  folic acid (FOLVITE) 1 MG tablet Take 1 mg by mouth daily. 08/22/20   [provider]  hyoscyamine (LEVSIN) 0.125 MG tablet Take 1 tablet (0.125 mg total) by mouth as needed for cramping. Reported on 09/24/2015 01/04/20   Copland, Gay Filler, MD  Immune Globulin, Human, (CUVITRU) 10 GM/50ML SOLN Inject 1 Dose into the skin once a week.    [provider]  levothyroxine (SYNTHROID) 50 MCG tablet Take 1 tablet (50 mcg total) by mouth daily before breakfast. 11/21/20   Copland, Gay Filler, MD  ondansetron (ZOFRAN) 8 MG tablet Take by mouth every 8 (eight) hours as needed for nausea or vomiting.    [provider]  pantoprazole (PROTONIX) 40 MG tablet Take 40 mg by mouth daily.    [provider]  rizatriptan (MAXALT-MLT) 10 MG disintegrating tablet Take 1 tablet (10 mg total) by mouth as needed for migraine. May repeat in 2 hours if needed 08/08/20   Copland, Gay Filler, MD  UNABLE TO FIND Take 2.4 mg by mouth daily. Med  Name: Lithium OTC    [provider]  valACYclovir (VALTREX) 500 MG tablet Take 500 mg by mouth 2 (two) times daily. 08/12/20   [provider]    Allergies    Aripiprazole, Ciprofloxacin, Lamotrigine, Nitrofurantoin, Other, Peanut oil, Amoxicillin, Doxycycline, Latex, Meloxicam, Naproxen, Pramipexole, Prednisone, Risperidone, Sulfa antibiotics, Tape, Cortisone, Percocet [oxycodone-acetaminophen], Shingrix [zoster vac recomb adjuvanted], Vancomycin, and Cuvitru [immune globulin (human)]  Review of Systems   Review of Systems  Gastrointestinal: Positive for abdominal pain.  Neurological: Positive for numbness (paresthesia in  left face) and headaches. Negative for seizures and speech difficulty.  All other systems reviewed and are negative.   Physical Exam Updated Vital Signs BP 115/61 (BP Location: Right Arm)   Pulse 69   Temp 97.9 F (36.6 C) (Oral)   Resp 18   Ht 5\' 3"  (1.6 m)   Wt 87.1 kg   SpO2 100%   BMI 34.01 kg/m   Physical Exam  ED Results / Procedures / Treatments   Labs (all labs ordered are listed, but only abnormal results are displayed) Labs Reviewed  CBC WITH DIFFERENTIAL/PLATELET - Abnormal; Notable for the following components:      Result Value   RBC 5.35 (*)    All other components within normal limits  LIPASE, BLOOD - Abnormal; Notable for the following components:   Lipase 66 (*)    All other components within normal limits  COMPREHENSIVE METABOLIC PANEL  TROPONIN I (HIGH SENSITIVITY)    EKG None  Radiology CT ABDOMEN PELVIS WO CONTRAST  Result Date: 12/24/2020 CLINICAL DATA:  Abdominal abscess or infection suspected EXAM: CT ABDOMEN AND PELVIS WITHOUT CONTRAST TECHNIQUE: Multidetector CT imaging of the abdomen and pelvis was performed following the standard protocol without IV contrast. COMPARISON:  02/10/2019 FINDINGS: Lower chest: Central line tip seen at the right atrium. No acute finding. Hepatobiliary: Multiple hepatic cystic  densities.Cholecystectomy without bile duct dilatation. Pancreas: Unremarkable. Spleen: Unremarkable. Adrenals/Urinary Tract: Negative adrenals. Mildly and homogeneously high-density urine, suspect recent enhanced scan. No hydronephrosis or stone. Unremarkable bladder. Stomach/Bowel: No obstruction. Appendectomy. No visible bowel inflammation. Vascular/Lymphatic: No acute vascular abnormality. Regressed retroperitoneal and mesenteric lymphadenopathy. Inguinal and iliac adenopathy persists. There has been interval outside PET CT follow-up, including 08/23/2020. Reproductive:Hysterectomy Other: No ascites or pneumoperitoneum. Musculoskeletal: No acute abnormalities. IMPRESSION: No acute finding. Electronically Signed   By: Monte Fantasia M.D.   On: 12/24/2020 04:35   CT Head Wo Contrast  Result Date: 12/24/2020 CLINICAL DATA:  Headache with intracranial hemorrhage suspected EXAM: CT HEAD WITHOUT CONTRAST TECHNIQUE: Contiguous axial images were obtained from the base of the skull through the vertex without intravenous contrast. COMPARISON:  05/10/2018 FINDINGS: Brain: No evidence of acute infarction, hemorrhage, hydrocephalus, extra-axial collection or mass lesion/mass effect. Nodular gray matter density along the left lateral ventricle from known heterotopia. Vascular: No hyperdense vessel or unexpected calcification. Skull: Osteoma along the left-sided calvarium Sinuses/Orbits: Negative IMPRESSION: 1. No acute finding. 2. Known periventricular gray matter heterotopia. Electronically Signed   By: Monte Fantasia M.D.   On: 12/24/2020 04:17   DG Chest Portable 1 View  Result Date: 12/24/2020 CLINICAL DATA:  Diffuse pain including headache EXAM: PORTABLE CHEST 1 VIEW COMPARISON:  02/15/2020 FINDINGS: Implantable loop recorder and right-sided port. Normal heart size and mediastinal contours. No acute infiltrate or edema. No effusion or pneumothorax. No acute osseous findings. IMPRESSION: No evidence of active  disease. Electronically Signed   By: Monte Fantasia M.D.   On: 12/24/2020 04:13    Procedures Procedures   Medications Ordered in ED Medications  fentaNYL (SUBLIMAZE) injection 50 mcg (50 mcg Intravenous Patient Refused/Not Given 12/24/20 0326)    ED Course  I have reviewed the triage vital signs and the nursing notes.  Pertinent labs & imaging results that were available during my care of the patient were reviewed by me and considered in my medical decision making (see chart for details).    MDM Rules/Calculators/A&P  Unclear etiology for symptoms. Very diffuse in nature. Improved prior to discharge. Negative imaging. Reassuring labs. Stable for d/c w/ pcp follow up.  Final Clinical Impression(s) / ED Diagnoses Final diagnoses:  Nonintractable headache, unspecified chronicity pattern, unspecified headache type  Left upper quadrant abdominal pain  Paresthesia    Rx / DC Orders ED Discharge Orders    None       Jamonte Curfman, [name], MD 12/24/20 [phone]

## 2020-12-24 NOTE — ED Notes (Signed)
Patient transported to X-ray 

## 2020-12-25 ENCOUNTER — Telehealth: Payer: Self-pay

## 2020-12-25 NOTE — Chronic Care Management (AMB) (Signed)
Chronic Care Management Pharmacy Assistant   Name: Nikiesha Milford  MRN: [id number] DOB: [birth year]   Reason for Encounter: Disease State General    Recent office visits:  11/20/20 Lamar Blinks PCP- General follow up.   Recent consult visits:  12/05/20 Bonnita Hollow (Pulmonology)- Seen for hypogammaglobulinemia.  11/18/20 Lanette Hampshire (Oncology)-chronic lymphocytic leukemia.  11/15/20 Lulu Riding (Oncology)- Granuloma of skin. 11/11/20 Tacy Berger(Oncology televisit)  Hospital visits:  Medication Reconciliation was completed by comparing discharge summary, patient's EMR and Pharmacy list, and upon discussion with patient.  Admitted to the hospital on 12/24/20 due to Non intractable headache. Discharge date was 12/24/20. Discharged from [facility]?Medications Started at [facility] Discharge:?? -started none  Medication Changes at Hospital Discharge: -Changed none  Medications Discontinued at Hospital Discharge: -Stopped none  Medications that remain the same after Hospital Discharge:??  -All other medications will remain the same.    Medications: Outpatient Encounter Medications as of 12/25/2020  Medication Sig Note  . acalabrutinib (CALQUENCE) 100 MG capsule Take 100 mg by mouth every 12 (twelve) hours.  (Patient not taking: Reported on 11/20/2020) 11/08/2020: Currently on hold  . acetaminophen (TYLENOL) 500 MG tablet Take 1,000 mg by mouth every 6 (six) hours as needed for mild pain.   Marland Kitchen albuterol (VENTOLIN HFA) 108 (90 Base) MCG/ACT inhaler Inhale 1-2 puffs into the lungs every 6 (six) hours as needed for wheezing or shortness of breath.   Marland Kitchen aspirin-acetaminophen-caffeine (EXCEDRIN EXTRA STRENGTH) 250-250-65 MG tablet Take 2 tablets by mouth every 6 (six) hours as needed for headache.   . diphenhydrAMINE (BENADRYL) 25 MG tablet Take 1 tablet (25 mg total) by mouth every 6 (six) hours as needed for itching.   Marland Kitchen EPINEPHrine 0.3 mg/0.3 mL  IJ SOAJ injection Inject 0.3 mLs (0.3 mg total) into the muscle as needed for anaphylaxis.   . fluticasone furoate-vilanterol (BREO ELLIPTA) 100-25 MCG/INH AEPB Inhale 1 puff into the lungs daily.   . folic acid (FOLVITE) 1 MG tablet Take 1 mg by mouth daily.   . hyoscyamine (LEVSIN) 0.125 MG tablet Take 1 tablet (0.125 mg total) by mouth as needed for cramping. Reported on 09/24/2015   . Immune Globulin, Human, (CUVITRU) 10 GM/50ML SOLN Inject 1 Dose into the skin once a week.   . levothyroxine (SYNTHROID) 50 MCG tablet Take 1 tablet (50 mcg total) by mouth daily before breakfast.   . ondansetron (ZOFRAN) 8 MG tablet Take by mouth every 8 (eight) hours as needed for nausea or vomiting.   . pantoprazole (PROTONIX) 40 MG tablet Take 40 mg by mouth daily.   . rizatriptan (MAXALT-MLT) 10 MG disintegrating tablet Take 1 tablet (10 mg total) by mouth as needed for migraine. May repeat in 2 hours if needed   . UNABLE TO FIND Take 2.4 mg by mouth daily. Med Name: Lithium OTC   . valACYclovir (VALTREX) 500 MG tablet Take 500 mg by mouth 2 (two) times daily.    No facility-administered encounter medications on file as of 12/25/2020.    Have you had any problems recently with your health? Patient states she saw Dr.Copland about fatigue and burning feet. It was due to her thyroid. Levothyroxine was increased and symptoms have improved. Patient was seen in ER on 12/25/20. Patient states she is experiencing neck pain, eye pain, shoulder pain, headache. Patient believes this may be due to a reaction. She states she had a ct scan with iv dye the same day.  Have you  had any problems with your pharmacy? Patient states she is not having any problems with her pharmacy.  What issues or side effects are you having with your medications? Patient states she has been having reactions to her subq injections but provider is aware and is changing brands.  What would you like me to pass along to [facility] for them to  help you with?  Patient states she is trying to get in for a ER follow up but has been unable to get in until 01/02/21  What can we do to take care of you better? Patient states there is nothing at this time.  Star Rating Drugs: None noted  [facility] Clinical Pharmacist Assistant [phone]

## 2020-12-27 DIAGNOSIS — Z959 Presence of cardiac and vascular implant and graft, unspecified: Secondary | ICD-10-CM | POA: Diagnosis not present

## 2020-12-28 NOTE — Progress Notes (Addendum)
[facility] [employer] [facility] [place], [place], [place] [address] [phone]  Date:  01/02/2021   Name:  Morgan [name]   DOB:  01/29/1966   MRN:  [id number]  PCP:  Darreld Mclean, MD    Chief Complaint: ER follow up   History of Present Illness:  Morgan [name] is a 55 y.o. very pleasant female patient who presents with the following:  Patient here today for hospital follow-up-history of CLL, hypothyroidism, mood disorder Oncology care through [facility] I saw her most recently May 4 She was seen in the ER on June 7 with headache and abdominal pain-etiology uncertain, her symptoms seemed to improve and she was discharged home Pt states she really was not having any headache, she was having more of a pain in her right abdomen and side which then traveled into her left arm.  She got concerned that she might have a heart issue, her troponin was normal in the ER  Also negative CT abd/ pelvis and negative CT head Chest film normal   She wonders if this might be related to her chronic shingles She is getting IgG infusions at home- rx by [facility]  She does feel like her shingles will wax and wane depending on her infusions She is taking valtrex 500 BID   At her last visit her TSH was elevated, we made an adjustment to her levothyroxine.  Need to recheck TSH today  She has had some sx of headache and anxiety off and on She has been doing better the last couple of days  She gives a long and somewhat confusing description of her symptoms over the last month.  It seems that she has had pain in her head, chest and abdomen which has come and gone at various times.  Overall she does seem to be improving, except she thinks she may have a sinus infection at this time  She did do an Evusheld infusion last week to prevent covid 19   She has tolerated cefdinir in the past on a few occasions - no reaction   Lab Results  Component Value Date   TSH  8.25 (H) 11/20/2020     Patient Active Problem List   Diagnosis Date Noted   Throat discomfort 03/12/2020   Hypothyroidism 11/18/2018   Fever 06/18/2018   Piriformis syndrome of right side 04/22/2017   Lateral epicondylitis of left elbow 04/06/2017   Solitary pulmonary nodule 10/29/2016   Chronic lymphocytic leukemia ([place]) 07/31/2016   Mass in neck 04/17/2016   Abdominal mass 04/17/2016   Asthma 12/12/2015   Multiple environmental allergies 12/12/2015   Chronic lower back pain 09/12/2015   Bipolar 1 disorder, depressed ([place]) 08/27/2015    Class: Chronic   Borderline personality disorder ([place]) 08/15/2015   Severe benzodiazepine use disorder ([place]) 08/15/2015   Alcohol use disorder, moderate, dependence ([place]) 08/15/2015   PTSD (post-traumatic stress disorder) 08/15/2015   History of bipolar disorder 08/15/2015   Pre-syncope 05/17/2015   Skull deformity 05/17/2015   Patellofemoral syndrome of both knees 05/02/2015    Past Medical History:  Diagnosis Date   Anxiety    Arthritis    Asthma    Bipolar disorder ([place])    Bulging lumbar disc 07/19/05   Chronic headaches    Chronic lymphocytic leukemia ([place]) 07/31/2016   Colon polyps    Degenerative disorder of bone    Depression    History of borderline personality disorder  Hypothyroidism 2007   Developed after use of Lithium   IBS (irritable bowel syndrome) 1995   Pneumonia    Proctitis    Shingles 07/2018   Substance abuse ([place])    ativan last month    Past Surgical History:  Procedure Laterality Date   ABDOMINAL HYSTERECTOMY     APPENDECTOMY     BUNIONECTOMY Right 06/2012   CHOLECYSTECTOMY     OOPHORECTOMY     SHOULDER OPEN ROTATOR CUFF REPAIR Right 03/2010   TUBAL LIGATION      Social History   Tobacco Use   Smoking status: Former    Pack years: 0.00    Types: Cigarettes    Quit date: 07/20/1980    Years since quitting: 40.4   Smokeless tobacco: Never   Tobacco comments:    Also exposed to second  hand smoke as a child  Vaping Use   Vaping Use: Never used  Substance Use Topics   Alcohol use: Yes    Alcohol/week: 0.0 standard drinks    Comment: occ   Drug use: Not Currently    Family History  Problem Relation Age of Onset   Depression Mother    Hypertension Mother    Post-traumatic stress disorder Sister    Alcohol abuse Brother    Drug abuse Brother    Post-traumatic stress disorder Brother    Thyroid disease Father    Alzheimer's disease Father    COPD Maternal Grandmother    Heart disease Maternal Grandfather    Arthritis Paternal Grandmother    Diabetes Paternal Grandmother    Depression Paternal Grandmother    Alzheimer's disease Paternal Grandmother    Heart disease Paternal Grandfather    Coronary artery disease Paternal Grandfather    Alcohol abuse Paternal Grandfather    Colon cancer Neg Hx    Breast cancer Neg Hx     Allergies  Allergen Reactions   Aripiprazole Anaphylaxis and Swelling   Ciprofloxacin Shortness Of Breath   Lamotrigine Rash and Other (See Comments)   Nitrofurantoin Hives   Other Anaphylaxis, Hives and Swelling    Walnuts and pine nuts   Peanut Oil Anaphylaxis, Hives, Swelling and Rash   Amoxicillin Hives and Rash    Has patient had a PCN reaction causing immediate rash, facial/tongue/throat swelling, SOB or lightheadedness with hypotension: Yes Has patient had a PCN reaction causing severe rash involving mucus membranes or skin necrosis: No Has patient had a PCN reaction that required hospitalization: No Has patient had a PCN reaction occurring within the last 10 years: No If all of the above answers are "NO", then may proceed with Cephalosporin use.   Doxycycline Hives and Rash   Latex Rash and Other (See Comments)   Meloxicam Itching and Other (See Comments)    Induces mania Interacts with Lithium   Naproxen Hives and Other (See Comments)    Interacts with lithium   Pramipexole Hives and Rash   Prednisone Other (See Comments)     Interacts with Lithium   Risperidone Hives and Rash   Sulfa Antibiotics Rash    "that leaves scarring"   Tape Itching and Rash    Steri-StripsT   Cortisone Other (See Comments)    Exacerbates the mania of her bipolar   Percocet [Oxycodone-Acetaminophen] Other (See Comments)    Severe dizziness   Shingrix [Zoster Vac Recomb Adjuvanted] Swelling    rash   Vancomycin Itching    Itching on head and at IV insertion site where Vanc running  Cuvitru [Immune Globulin (Human)] Rash    Medication list has been reviewed and updated.  Current Outpatient Medications on File Prior to Visit  Medication Sig Dispense Refill   acetaminophen (TYLENOL) 500 MG tablet Take 1,000 mg by mouth every 6 (six) hours as needed for mild pain.     albuterol (VENTOLIN HFA) 108 (90 Base) MCG/ACT inhaler Inhale 1-2 puffs into the lungs every 6 (six) hours as needed for wheezing or shortness of breath. 18 g 6   aspirin-acetaminophen-caffeine (EXCEDRIN EXTRA STRENGTH) 250-250-65 MG tablet Take 2 tablets by mouth every 6 (six) hours as needed for headache.     diphenhydrAMINE (BENADRYL) 25 MG tablet Take 1 tablet (25 mg total) by mouth every 6 (six) hours as needed for itching. 20 tablet 0   EPINEPHrine 0.3 mg/0.3 mL IJ SOAJ injection Inject 0.3 mLs (0.3 mg total) into the muscle as needed for anaphylaxis. 1 each prn   fluticasone furoate-vilanterol (BREO ELLIPTA) 100-25 MCG/INH AEPB Inhale 1 puff into the lungs daily. 1 each 11   folic acid (FOLVITE) 1 MG tablet Take 1 mg by mouth daily.     hyoscyamine (LEVSIN) 0.125 MG tablet Take 1 tablet (0.125 mg total) by mouth as needed for cramping. Reported on 09/24/2015 10 tablet 2   levothyroxine (SYNTHROID) 50 MCG tablet Take 1 tablet (50 mcg total) by mouth daily before breakfast. 30 tablet 5   ondansetron (ZOFRAN) 8 MG tablet Take by mouth every 8 (eight) hours as needed for nausea or vomiting.     pantoprazole (PROTONIX) 40 MG tablet Take 40 mg by mouth daily.      rizatriptan (MAXALT-MLT) 10 MG disintegrating tablet Take 1 tablet (10 mg total) by mouth as needed for migraine. May repeat in 2 hours if needed 9 tablet 11   UNABLE TO FIND Take 2.4 mg by mouth daily. Med Name: Lithium OTC     valACYclovir (VALTREX) 500 MG tablet Take 500 mg by mouth 2 (two) times daily.     acalabrutinib (CALQUENCE) 100 MG capsule Take 100 mg by mouth every 12 (twelve) hours.  (Patient not taking: No sig reported)     No current facility-administered medications on file prior to visit.    Review of Systems:  As per HPI- otherwise negative.   Physical Examination: Vitals:   01/02/21 1419  BP: 108/64  Pulse: 81  Resp: 18  Temp: 97.6 F (36.4 C)  SpO2: 97%   Vitals:   01/02/21 1419  Weight: 194 lb (88 kg)  Height: 5\' 3"  (1.6 m)   Body mass index is 34.37 kg/m. Ideal Body Weight: Weight in (lb) to have BMI = 25: 140.8  GEN: no acute distress.  Overweight, looks well and her normal self HEENT: Atraumatic, Normocephalic.   Bilateral TM wnl, oropharynx normal.  PEERL,EOMI. she notes tenderness to percussion over her frontal sinuses Ears and Nose: No external deformity. CV: RRR, No M/G/R. No JVD. No thrill. No extra heart sounds. PULM: CTA B, no wheezes, crackles, rhonchi. No retractions. No resp. distress. No accessory muscle use. ABD: S, NT, ND, +BS. No rebound. No HSM.  Belly is benign EXTR: No c/c/e PSYCH: Normally interactive. Conversant.    Assessment and Plan: Acquired hypothyroidism - Plan: TSH  History of anemia - Plan: Ferritin  Acute non-recurrent frontal sinusitis - Plan: cefdinir (OMNICEF) 300 MG capsule  Dyslipidemia - Plan: Lipid panel  Patient today with a few concerns.  She was seen in the ER about a week ago with an  episode of difficult to describe pain.  She had evaluation including extensive imaging and also lab work-reassuring  At this time she is still having some symptoms but it seems better.  I advised her I do not have a  definite explanation for her symptoms, but I am glad she is feeling better.  She wonders if her pain could represent some sort of seizure.  Advised her that I am not sure, but certainly we could have her follow-up with neurology if she would like  At this time we will follow-up on her thyroid and ferritin, lipid panel.  I prescribed Omnicef for presumed sinus infection, she has tolerated this in the recent past without any allergic reaction.  This visit occurred during the SARS-CoV-2 public health emergency.  Safety protocols were in place, including screening questions prior to the visit, additional usage of staff PPE, and extensive cleaning of exam room while observing appropriate contact time as indicated for disinfecting solutions.   Signed Lamar Blinks, MD   6/17- received her labs as below, message to pt  Results for orders placed or performed in visit on 01/02/21  TSH  Result Value Ref Range   TSH 2.16 0.35 - 4.50 uIU/mL  Ferritin  Result Value Ref Range   Ferritin 19.9 10.0 - 291.0 ng/mL  Lipid panel  Result Value Ref Range   Cholesterol 262 (H) 0 - 200 mg/dL   Triglycerides 248.0 (H) 0.0 - 149.0 mg/dL   HDL 77.90 >39.00 mg/dL   VLDL 49.6 (H) 0.0 - 40.0 mg/dL   Total CHOL/HDL Ratio 3    NonHDL 183.89   LDL cholesterol, direct  Result Value Ref Range   Direct LDL 140.0 mg/dL   The 10-year ASCVD risk score Mikey Bussing DC Jr., et al., 2013) is: 1.5%   Values used to calculate the score:     Age: 19 years     Sex: Female     Is Non-Hispanic African American: No     Diabetic: No     Tobacco smoker: No     Systolic Blood Pressure: 038 mmHg     Is BP treated: No     HDL Cholesterol: 77.9 mg/dL     Total Cholesterol: 262 mg/dL

## 2020-12-28 NOTE — Patient Instructions (Addendum)
It was good to see you again today, I will be in touch with your TSH level and other labs asap I hope that your unusual symptoms continue to ease- let me know if other concerns!    I will also treat you for a possible sinus infection with omnicef twice a day for 10 days  If any allergic reaction please let me know

## 2020-12-30 ENCOUNTER — Encounter: Payer: Self-pay | Admitting: General Practice

## 2020-12-30 DIAGNOSIS — D801 Nonfamilial hypogammaglobulinemia: Secondary | ICD-10-CM | POA: Diagnosis not present

## 2020-12-30 NOTE — Progress Notes (Signed)
[facility] Spiritual Care Note  Met in Spiritual Care Virtual Visit via Webex as scheduled. Amber used the opportunity to share recent spiritual struggles and emotional patterns. She is working hard to make meaning in the midst of a spiritually unmotivating/challenging time. She plans to follow up later in the week for another pastoral check-in.   [place], [place], [facility] Pager [phone] Voicemail [phone]

## 2021-01-01 ENCOUNTER — Encounter: Payer: Self-pay | Admitting: General Practice

## 2021-01-01 ENCOUNTER — Telehealth: Payer: Self-pay | Admitting: Family Medicine

## 2021-01-01 NOTE — Telephone Encounter (Addendum)
I called patient she states she was seen in the [facility] urgent care/ED on 12/24/20 she felt she was having a heart attack symptoms as left arm shoulder pain,L eye ear, neck pounding, felt stiff,  those things checked out okay.  In Epic.  Labs, CT head/ Bp ok/ EKG ok.  She had migraine R eye took rizatriptan ok.  She c/o of transient (had 4-5 episodes today) lasting 2 sec , for tingling, numbness, then would have L eye pain, some nausea, dizziness, felt like pass out, then would go away.  She says talking make things worse. ?   We see for migraines.  She has appt with pcp tomorrow.  I relayed Amy NP and Dr. Leta Baptist out till next week.  Can send message.  Glad she has appt tomorrow for evaluation.  ? Migraine.  Placed on cancellation list at my desk if opening comes available for sooner appt.

## 2021-01-01 NOTE — Progress Notes (Signed)
[facility] Spiritual Care Note  Received call from [facility] to process new/unusual symptoms in preparation for talking to her nurse/doctor about them. She interrupted the call while feeling poorly, so I returned call later, leaving voicemail of support including encouragement to check in.   [place], [place], [facility] Pager [phone] Voicemail [phone]

## 2021-01-01 NOTE — Telephone Encounter (Signed)
Pt called stating that she was in th ER on June 7th due to migraines and today she is not feeling right and is having tingling sensation in her head and eye pain. Pt would like to speak to the RN to be advised.

## 2021-01-02 ENCOUNTER — Ambulatory Visit (INDEPENDENT_AMBULATORY_CARE_PROVIDER_SITE_OTHER): Payer: Medicare Other | Admitting: Family Medicine

## 2021-01-02 ENCOUNTER — Other Ambulatory Visit: Payer: Self-pay

## 2021-01-02 ENCOUNTER — Encounter: Payer: Self-pay | Admitting: Family Medicine

## 2021-01-02 VITALS — BP 108/64 | HR 81 | Temp 97.6°F | Resp 18 | Ht 63.0 in | Wt 194.0 lb

## 2021-01-02 DIAGNOSIS — E785 Hyperlipidemia, unspecified: Secondary | ICD-10-CM | POA: Diagnosis not present

## 2021-01-02 DIAGNOSIS — E039 Hypothyroidism, unspecified: Secondary | ICD-10-CM

## 2021-01-02 DIAGNOSIS — Z862 Personal history of diseases of the blood and blood-forming organs and certain disorders involving the immune mechanism: Secondary | ICD-10-CM

## 2021-01-02 DIAGNOSIS — J0110 Acute frontal sinusitis, unspecified: Secondary | ICD-10-CM

## 2021-01-02 MED ORDER — CEFDINIR 300 MG PO CAPS: 300.0000 mg | ORAL_CAPSULE | Freq: Two times a day (BID) | ORAL | 0 refills | Status: AC

## 2021-01-03 ENCOUNTER — Encounter: Payer: Self-pay | Admitting: General Practice

## 2021-01-03 ENCOUNTER — Encounter: Payer: Self-pay | Admitting: Family Medicine

## 2021-01-03 LAB — LIPID PANEL
Cholesterol: 262 mg/dL — ABNORMAL HIGH (ref 0–200)
HDL: 77.9 mg/dL (ref >39.00)
NonHDL: 183.89
Total CHOL/HDL Ratio: 3
Triglycerides: 248 mg/dL — ABNORMAL HIGH (ref 0.0–149.0)
VLDL: 49.6 mg/dL — ABNORMAL HIGH (ref 0.0–40.0)

## 2021-01-03 LAB — TSH: TSH: 2.16 u[IU]/mL (ref 0.35–4.50)

## 2021-01-03 LAB — LDL CHOLESTEROL, DIRECT: Direct LDL: 140 mg/dL

## 2021-01-03 LAB — FERRITIN: Ferritin: 19.9 ng/mL (ref 10.0–291.0)

## 2021-01-03 NOTE — Progress Notes (Signed)
[facility] Spiritual Care Note  Received brief update/processing call from [place]. Provided empathic listening and emotional support. We plan to follow up next week to discuss a spiritual question in more detail.   [place], [place], [facility] Pager [phone] Voicemail [phone]

## 2021-01-04 DIAGNOSIS — D801 Nonfamilial hypogammaglobulinemia: Secondary | ICD-10-CM | POA: Diagnosis not present

## 2021-01-05 DIAGNOSIS — D801 Nonfamilial hypogammaglobulinemia: Secondary | ICD-10-CM | POA: Diagnosis not present

## 2021-01-06 ENCOUNTER — Encounter: Payer: Self-pay | Admitting: General Practice

## 2021-01-06 NOTE — Progress Notes (Signed)
[facility] Spiritual Care Note  Received call from [facility] to process recent frustrations with treatment and symptoms in order to gain insight and to prepare talking points for an appointment tomorrow. Provided reflective listening, questions for monitoring/discernment, and pastoral reflection. Rebekah noted that she found the conversation very clarifying and plans to reach out again as needed.   [place], [place], [facility] Pager [phone] Voicemail [phone]

## 2021-01-07 DIAGNOSIS — H5712 Ocular pain, left eye: Secondary | ICD-10-CM | POA: Diagnosis not present

## 2021-01-07 DIAGNOSIS — G43101 Migraine with aura, not intractable, with status migrainosus: Secondary | ICD-10-CM | POA: Diagnosis not present

## 2021-01-10 ENCOUNTER — Encounter: Payer: Self-pay | Admitting: General Practice

## 2021-01-10 NOTE — Progress Notes (Signed)
[name] Spiritual Care Note  Had Spiritual Care Virtual Visit via Webex with Morgan [name] to support her in theological reflection on some life and Scripture-oriented questions. Provided pastoral presence, reflective listening, and affirmation of strengths as she works toward ongoing healing and wholeness.   [place], [place], [facility] Pager [phone] Voicemail [phone]

## 2021-01-11 DIAGNOSIS — D801 Nonfamilial hypogammaglobulinemia: Secondary | ICD-10-CM | POA: Diagnosis not present

## 2021-01-13 ENCOUNTER — Encounter: Payer: Self-pay | Admitting: General Practice

## 2021-01-13 NOTE — Progress Notes (Signed)
[facility] Spiritual Care Note  Received call from [facility] to process her difficulty with what we called the "whack-a-mole" experience of symptoms that keep popping up. Recurring shingles is one example. She reports feeling thwarted in "living a normal life" because she feels "always on the lookout" for new symptoms. Provided empathic, reflective listening; normalization of feelings; and emotional support briefly before she took a call from another support person. She plans to follow up when needed.   [place], [place], [facility] Pager [phone] Voicemail [phone]

## 2021-01-18 DIAGNOSIS — D801 Nonfamilial hypogammaglobulinemia: Secondary | ICD-10-CM | POA: Diagnosis not present

## 2021-01-23 ENCOUNTER — Encounter: Payer: Self-pay | Admitting: General Practice

## 2021-01-23 ENCOUNTER — Telehealth: Payer: Self-pay | Admitting: Family Medicine

## 2021-01-23 NOTE — Progress Notes (Signed)
[place] Note  Received call from [facility] for help as a listening partner as she processed recent symptoms in order to prepare for reporting them to a provider's RN. Provided reflective listening, affirmation of strengths, and emotional support.   [place], [place], [facility] Pager [phone] Voicemail [phone]

## 2021-01-24 DIAGNOSIS — R590 Localized enlarged lymph nodes: Secondary | ICD-10-CM | POA: Diagnosis not present

## 2021-01-24 DIAGNOSIS — C9110 Chronic lymphocytic leukemia of B-cell type not having achieved remission: Secondary | ICD-10-CM | POA: Diagnosis not present

## 2021-01-24 DIAGNOSIS — Z79899 Other long term (current) drug therapy: Secondary | ICD-10-CM | POA: Diagnosis not present

## 2021-01-24 DIAGNOSIS — R1011 Right upper quadrant pain: Secondary | ICD-10-CM | POA: Diagnosis not present

## 2021-01-24 DIAGNOSIS — D801 Nonfamilial hypogammaglobulinemia: Secondary | ICD-10-CM | POA: Diagnosis not present

## 2021-01-24 DIAGNOSIS — Z87891 Personal history of nicotine dependence: Secondary | ICD-10-CM | POA: Diagnosis not present

## 2021-01-25 DIAGNOSIS — D801 Nonfamilial hypogammaglobulinemia: Secondary | ICD-10-CM | POA: Diagnosis not present

## 2021-02-03 DIAGNOSIS — R911 Solitary pulmonary nodule: Secondary | ICD-10-CM | POA: Diagnosis not present

## 2021-02-03 DIAGNOSIS — J45909 Unspecified asthma, uncomplicated: Secondary | ICD-10-CM | POA: Diagnosis not present

## 2021-02-03 DIAGNOSIS — N8110 Cystocele, unspecified: Secondary | ICD-10-CM | POA: Diagnosis not present

## 2021-02-03 DIAGNOSIS — I493 Ventricular premature depolarization: Secondary | ICD-10-CM | POA: Diagnosis not present

## 2021-02-03 DIAGNOSIS — R3129 Other microscopic hematuria: Secondary | ICD-10-CM | POA: Diagnosis not present

## 2021-02-03 DIAGNOSIS — B029 Zoster without complications: Secondary | ICD-10-CM | POA: Diagnosis not present

## 2021-02-03 DIAGNOSIS — R002 Palpitations: Secondary | ICD-10-CM | POA: Diagnosis not present

## 2021-02-03 DIAGNOSIS — Z87891 Personal history of nicotine dependence: Secondary | ICD-10-CM | POA: Diagnosis not present

## 2021-02-03 DIAGNOSIS — D801 Nonfamilial hypogammaglobulinemia: Secondary | ICD-10-CM | POA: Diagnosis not present

## 2021-02-03 DIAGNOSIS — Z79899 Other long term (current) drug therapy: Secondary | ICD-10-CM | POA: Diagnosis not present

## 2021-02-03 DIAGNOSIS — R590 Localized enlarged lymph nodes: Secondary | ICD-10-CM | POA: Diagnosis not present

## 2021-02-03 DIAGNOSIS — C9110 Chronic lymphocytic leukemia of B-cell type not having achieved remission: Secondary | ICD-10-CM | POA: Diagnosis not present

## 2021-02-03 DIAGNOSIS — Z856 Personal history of leukemia: Secondary | ICD-10-CM | POA: Diagnosis not present

## 2021-02-03 DIAGNOSIS — K589 Irritable bowel syndrome without diarrhea: Secondary | ICD-10-CM | POA: Diagnosis not present

## 2021-02-03 DIAGNOSIS — R42 Dizziness and giddiness: Secondary | ICD-10-CM | POA: Diagnosis not present

## 2021-02-03 DIAGNOSIS — R591 Generalized enlarged lymph nodes: Secondary | ICD-10-CM | POA: Diagnosis not present

## 2021-02-03 DIAGNOSIS — R319 Hematuria, unspecified: Secondary | ICD-10-CM | POA: Diagnosis not present

## 2021-02-06 DIAGNOSIS — D801 Nonfamilial hypogammaglobulinemia: Secondary | ICD-10-CM | POA: Diagnosis not present

## 2021-02-07 ENCOUNTER — Encounter: Payer: Self-pay | Admitting: General Practice

## 2021-02-07 NOTE — Progress Notes (Signed)
[facility] Spiritual Care Note  Had Spiritual Care Virtual Visit for Morgan [name] to share and process treatment updates after a series of specialist appointments. She appeared more upbeat and relaxed (less down and distressed) than in our last encounter and utilized the time well to work through details and feelings. She plans to reach out again as needed for further support, likely also via Webex.   [place], [place], [facility] Pager [phone] Voicemail [phone]

## 2021-02-09 DIAGNOSIS — D801 Nonfamilial hypogammaglobulinemia: Secondary | ICD-10-CM | POA: Diagnosis not present

## 2021-02-10 ENCOUNTER — Encounter: Payer: Self-pay | Admitting: General Practice

## 2021-02-10 NOTE — Progress Notes (Signed)
[place] Spiritual Care Note  [place] called to process a spiritual hurt that occurred at church and discernment about how to respond. Provided pastoral presence and empathic, reflective listening. Because I will be out of the office next week, we plan to follow up on Thursday for a pastoral check-in.   [employer], [place], [facility] Pager [phone] Voicemail [phone]

## 2021-02-13 ENCOUNTER — Encounter: Payer: Self-pay | Admitting: General Practice

## 2021-02-13 NOTE — Progress Notes (Signed)
[facility] Note  Met with Lyllian in my office as planned to discuss matters of meaning, purpose, faith, and morale. 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 Having something that feels empowering and positive to engage in and look forward to is a valuable focus for her right now. We plan to follow up in a couple weeks for more pastoral reflection and processing.   [place], [place], [facility] Pager [phone] Voicemail [phone]

## 2021-02-16 DIAGNOSIS — D801 Nonfamilial hypogammaglobulinemia: Secondary | ICD-10-CM | POA: Diagnosis not present

## 2021-02-16 DIAGNOSIS — Z20822 Contact with and (suspected) exposure to covid-19: Secondary | ICD-10-CM | POA: Diagnosis not present

## 2021-02-17 ENCOUNTER — Other Ambulatory Visit: Payer: Self-pay | Admitting: Pharmacist

## 2021-02-17 ENCOUNTER — Ambulatory Visit (INDEPENDENT_AMBULATORY_CARE_PROVIDER_SITE_OTHER): Payer: Medicare Other | Admitting: Pharmacist

## 2021-02-17 ENCOUNTER — Encounter: Payer: Self-pay | Admitting: Family Medicine

## 2021-02-17 DIAGNOSIS — J45909 Unspecified asthma, uncomplicated: Secondary | ICD-10-CM

## 2021-02-17 DIAGNOSIS — K3189 Other diseases of stomach and duodenum: Secondary | ICD-10-CM

## 2021-02-17 DIAGNOSIS — R1084 Generalized abdominal pain: Secondary | ICD-10-CM

## 2021-02-17 DIAGNOSIS — E039 Hypothyroidism, unspecified: Secondary | ICD-10-CM | POA: Diagnosis not present

## 2021-02-17 MED ORDER — HYOSCYAMINE SULFATE 0.125 MG PO TABS: 0.1250 mg | ORAL_TABLET | ORAL | 2 refills | Status: DC | PRN

## 2021-02-17 NOTE — Telephone Encounter (Signed)
Patient requesting refill for hyoscyamine 0.'125mg'$ 

## 2021-02-17 NOTE — Patient Instructions (Signed)
Visit Information  PATIENT GOALS:  Goals Addressed             This Visit's Progress    Chronic Care Mangement Pharmacy Care Plan   On track    CARE PLAN ENTRY  Current Barriers:  Chronic Disease Management support, education, and care coordination needs related to Asthma, Hypothyroidism, Bipolar Disorder, Leukemia, Stomach Cramping, Recurrent Shingles, Migraine, Allergy  Asthma Pharmacist Clinical Goal(s) Over the next 90 days, patient will work with PharmD and providers to reduce symptoms of asthma Current regimen:  Albuterol inhaler (ProAir) as needed Breo Ellipta 100-37mg 1 puff daily Interventions:  Education provided regarding maintenance versus rescue inhaler and when to use.  Reviewed inhaler use / technique Patient self care activities - Over the next 120 days, patient will: Maintain asthma medication regimen Remember BMemory Danceis maintenance inhaler - use every day;  Albuterol is rescue inhaler and is used when you have shortness of breath or wheezing. Place albuterol inhaler beside bed to use at night as needed for shortness of  breath.  Remember to rinse mouth after using Breo to prevent infection / thrush.   Migraine Pharmacist Clinical Goal(s) Over the next 90 days, patient will work with PharmD and providers to reduce symptoms of migraine Current regimen:  Rizatriptan '10mg'$  as needed Excedrin migraine as needed Interventions: none Patient self care activities - Over the next 90 days, patient will: Maintain migraine medication regimen  Hypothyroidism Pharmacist Clinical Goal(s) Over the next 90 days, patient will work with PharmD and providers to reduce symptoms of hypothyroidism Current regimen:  Levothyroxine 545m daily Interventions: Recommended continue current dose of levothyroxine Patient self care activities - Over the next 90 days, patient will: Maintain thyroid medication regimen   Acid Reflux / GERD / abdominal pain:  Pharmacist Clinical  Goal(s) Over the next 90 days, patient will work with PharmD and providers to reduce symptoms of acid reflux Current regimen:  Pantoprazole '40mg'$  daily (had been on hold while taking Calquence) Hyoscamine 0.'125mg'$  - take as needed for stomach cramping Interventions: Reviewed interaction between Calquence and pantoprazole Ok to continue pantoprazole while Calquence is on hold but will need alternative for reflux if Calquence is restarted.  Coordinated with PCP for refill on hyoscamine Patient self care activities - Over the next 90 days, patient will: Maintain current reflux regimen unless Calquence is restarted Make sure oncology team is aware you have restarted pantoprazole.  Pick up hyoscyamine prescription from pharmacy  Bipolar / Mood: Pharmacist Clinical Goal(s) Over the next 90 days, patient will work with PharmD and providers to maintain contact with mental health providers and maintain control of mood Current regimen:  Over the Counter lithium once daily Counseling Interventions: Endorsed continued follow up with counselors and spiritual care Patient self care activities - Over the next 90 days, patient will: [facility] Continue to meet with chaplain with [facility]   Medication management Pharmacist Clinical Goal(s): Over the next 90 days, patient will work with PharmD and providers to maintain optimal medication adherence Current pharmacy: [facility] Interventions Comprehensive medication review performed. Continue current medication management strategy Patient self care activities - Over the next 90 days, patient will: Focus on medication adherence by filling and taking medications appropriately  Take medications as prescribed Report any questions or concerns to PharmD and/or provider(s)  Please see past updates related to this goal by clicking on the "Past Updates" button in the  selected goal  Patient verbalizes understanding of instructions provided today and agrees to view in [place].   Telephone follow up appointment with care management team member scheduled for: 3 to 4 months  [name], PharmD Clinical Pharmacist [facility] [phone]

## 2021-02-17 NOTE — Chronic Care Management (AMB) (Signed)
Chronic Care Management Pharmacy Note  02/17/2021 Name:  Morgan [name] MRN:  [id number] DOB:  Jun 30, 1966  Subjective: Morgan [name] is an 55 y.o. year old female who is a primary patient of Copland, Gay Filler, MD.  The CCM team was consulted for assistance with disease management and care coordination needs.    Engaged with patient by telephone for follow up visit in response to provider referral for pharmacy case management and/or care coordination services.   Consent to Services:  The patient was given information about Chronic Care Management services, agreed to services, and gave verbal consent prior to initiation of services.  Please see initial visit note for detailed documentation.   Patient Care Team: Copland, Gay Filler, MD as PCP - General (Family Medicine) Binnie Rail, MD (Internal Medicine) Lanette Hampshire, MD as Referring Physician (Internal Medicine) Jeanann Lewandowsky, MD as Referring Physician (Oncology) [name], PharmD (Pharmacist) Mcarthur Rossetti, MD as Consulting Physician (Orthopedic Surgery) Rockne Coons, MD (Infectious Diseases)  Recent office visits: 01/02/2021 - PCP (Dr Lorelei Pont) hospital f/u; TSH rechecked; treated for sinus infection with cefdinir 371m bid for 10 days.   Recent consult visits: [facility]2021 [facility] H[facility] Biles F/U hypogammaglobulinemia. On SQ replacement therapy 10 gram weekly. On Hizentra. Recommended long activing antihistamine on schedule and ondansetron 423mas needed for nausea.  02/03/2021 - Hem / Onc (Dr BrDorothea Ogle [facility]) - unable to see visit notes  Hospital visits: 12/24/2020 - ER Visit for Headcahe and abdominal pain. Troponin normal; negative CT of abd and head. Chest xray normal. No med changes.   Objective:  Lab Results  Component Value Date   CREATININE 0.81 12/24/2020   CREATININE 0.7 08/12/2020   CREATININE 0.87 08/01/2020    Lab Results  Component Value  Date   HGBA1C 5.1 11/20/2020   Last diabetic Eye exam: No results found for: HMDIABEYEEXA  Last diabetic Foot exam: No results found for: HMDIABFOOTEX      Component Value Date/Time   CHOL 262 (H) 01/02/2021 1519   TRIG 248.0 (H) 01/02/2021 1519   HDL 77.90 01/02/2021 1519   CHOLHDL 3 01/02/2021 1519   VLDL 49.6 (H) 01/02/2021 1519   LDLCALC 103 (H) 04/27/2018 1403   LDLDIRECT 140.0 01/02/2021 1519    Hepatic Function Latest Ref Rng & Units 12/24/2020 08/12/2020 08/01/2020  Total Protein 6.5 - 8.1 g/dL 7.3 - 6.9  Albumin 3.5 - 5.0 g/dL 4.1 - 4.7  AST 15 - 41 U/L 22 16 13   ALT 0 - 44 U/L 12 11 10   Alk Phosphatase 38 - 126 U/L 66 51 67  Total Bilirubin 0.3 - 1.2 mg/dL 0.4 - 0.4    Lab Results  Component Value Date/Time   TSH 2.16 01/02/2021 03:19 PM   TSH 8.25 (H) 11/20/2020 01:26 PM   FREET4 1.19 03/02/2018 03:24 PM   FREET4 0.65 04/17/2016 10:31 AM    CBC Latest Ref Rng & Units 12/24/2020 08/12/2020 08/01/2020  WBC 4.0 - 10.5 K/uL 5.5 8.3 3.9(L)  Hemoglobin 12.0 - 15.0 g/dL 15.0 14.2 15.1(H)  Hematocrit 36.0 - 46.0 % 44.9 44 45.9  Platelets 150 - 400 K/uL 251 259 211.0    Lab Results  Component Value Date/Time   VD25OH 43.91 11/20/2020 01:26 PM   VD25OH 25.73 (L) 06/17/2020 11:18 AM    Clinical ASCVD: No  The 10-year ASCVD risk score (GMikey BussingC Jr., et al., 2013) is: 2%   Values used to calculate the score:  Age: 52 years     Sex: Female     Is Non-Hispanic African American: No     Diabetic: No     Tobacco smoker: No     Systolic Blood Pressure: 867 mmHg     Is BP treated: No     HDL Cholesterol: 77.9 mg/dL     Total Cholesterol: 262 mg/dL    Other: (CHADS2VASc if Afib, PHQ9 if depression, MMRC or CAT for COPD, ACT, DEXA)  Social History   Tobacco Use  Smoking Status Former   Types: Cigarettes   Quit date: 07/20/1980   Years since quitting: 40.6  Smokeless Tobacco Never  Tobacco Comments   Also exposed to second hand smoke as a child   BP Readings from  Last 3 Encounters:  01/02/21 108/64  12/24/20 115/61  11/20/20 124/90   Pulse Readings from Last 3 Encounters:  01/02/21 81  12/24/20 69  11/20/20 83   Wt Readings from Last 3 Encounters:  01/02/21 194 lb (88 kg)  12/24/20 192 lb (87.1 kg)  11/20/20 188 lb 3.2 oz (85.4 kg)    Assessment: Review of patient past medical history, allergies, medications, health status, including review of consultants reports, laboratory and other test data, was performed as part of comprehensive evaluation and provision of chronic care management services.   SDOH:  (Social Determinants of Health) assessments and interventions performed:  SDOH Interventions    Flowsheet Row Most Recent Value  SDOH Interventions   Financial Strain Interventions Intervention Not Indicated  [no need at this time. patient will notify if rent increaeses.]  Stress Interventions Other (Comment)  [Patient has counselor at [facility] and also spiritual counselor with [facility].]       CCM Care Plan  Allergies  Allergen Reactions   Aripiprazole Anaphylaxis and Swelling   Ciprofloxacin Shortness Of Breath   Lamotrigine Rash and Other (See Comments)   Nitrofurantoin Hives   Other Anaphylaxis, Hives and Swelling    Walnuts and pine nuts   Peanut Oil Anaphylaxis, Hives, Swelling and Rash   Amoxicillin Hives and Rash    Has patient had a PCN reaction causing immediate rash, facial/tongue/throat swelling, SOB or lightheadedness with hypotension: Yes Has patient had a PCN reaction causing severe rash involving mucus membranes or skin necrosis: No Has patient had a PCN reaction that required hospitalization: No Has patient had a PCN reaction occurring within the last 10 years: No If all of the above answers are "NO", then may proceed with Cephalosporin use.   Doxycycline Hives and Rash   Latex Rash and Other (See Comments)   Meloxicam Itching and Other (See Comments)    Induces mania Interacts with Lithium    Naproxen Hives and Other (See Comments)    Interacts with lithium   Pramipexole Hives and Rash   Prednisone Other (See Comments)    Interacts with Lithium   Risperidone Hives and Rash   Sulfa Antibiotics Rash    "that leaves scarring"   Tape Itching and Rash    Steri-StripsT   Cortisone Other (See Comments)    Exacerbates the mania of her bipolar   Percocet [Oxycodone-Acetaminophen] Other (See Comments)    Severe dizziness   Shingrix [Zoster Vac Recomb Adjuvanted] Swelling    rash   Vancomycin Itching    Itching on head and at IV insertion site where Vanc running   Cuvitru [Immune Globulin (Human)] Rash    Medications Reviewed Today     Reviewed by [name], PharmD (  Pharmacist) on 02/17/21 at 1121  Med List Status: <None>   Medication Order Taking? Sig Documenting Provider Last Dose Status Informant  acalabrutinib (CALQUENCE) 100 MG capsule [id number] No Take 100 mg by mouth every 12 (twelve) hours.   Patient not taking: No sig reported   [provider] Not Taking Active            Med Note Antony Contras, Marley Pakula B   Fri Nov 08, 2020  9:19 AM) Currently on hold  acetaminophen (TYLENOL) 500 MG tablet [id number] Yes Take 1,000 mg by mouth every 6 (six) hours as needed for mild pain. [provider] Taking Active Self  albuterol (VENTOLIN HFA) 108 (90 Base) MCG/ACT inhaler [id number] Yes Inhale 1-2 puffs into the lungs every 6 (six) hours as needed for wheezing or shortness of breath. Copland, Gay Filler, MD Taking Active Self  aspirin-acetaminophen-caffeine Jearld Adjutant EXTRA STRENGTH) [phone] MG tablet [id number] Yes Take 2 tablets by mouth every 6 (six) hours as needed for headache. [provider] Taking Active Self  diphenhydrAMINE (BENADRYL) 25 MG tablet [id number] Yes Take 1 tablet (25 mg total) by mouth every 6 (six) hours as needed for itching.  Patient taking differently: Take 25 mg by mouth every 6 (six) hours as needed for itching. Uses as premed  prior to Ammie Ferrier, Barbette Hair, MD Taking Active Self  EPINEPHrine 0.3 mg/0.3 mL IJ SOAJ injection [id number] Yes Inject 0.3 mLs (0.3 mg total) into the muscle as needed for anaphylaxis. Copland, Gay Filler, MD Taking Active   fexofenadine (ALLEGRA) 180 MG tablet [id number] Yes Take 180 mg by mouth daily. [provider] Taking Active   fluticasone furoate-vilanterol (BREO ELLIPTA) 100-25 MCG/INH AEPB [id number] Yes Inhale 1 puff into the lungs daily. Copland, Gay Filler, MD Taking Active Self  folic acid (FOLVITE) 1 MG tablet [id number] No Take 1 mg by mouth daily.  Patient not taking: Reported on 02/17/2021   [provider] Not Taking Active   hyoscyamine (LEVSIN) 0.125 MG tablet [id number] Yes Take 1 tablet (0.125 mg total) by mouth as needed for cramping. Reported on 09/24/2015 Copland, Gay Filler, MD Taking Active Self  Immune Globulin, Human, (HIZENTRA) 10 GM/50ML SOLN [id number] Yes Inject 10 g into the skin once a week. [provider] Taking Active   levothyroxine (SYNTHROID) 50 MCG tablet [id number] Yes Take 1 tablet (50 mcg total) by mouth daily before breakfast. Copland, Gay Filler, MD Taking Active   ondansetron (ZOFRAN) 8 MG tablet [id number] Yes Take by mouth every 8 (eight) hours as needed for nausea or vomiting. [provider] Taking Active   pantoprazole (PROTONIX) 40 MG tablet [id number] Yes Take 40 mg by mouth daily. [provider] Taking Active   rizatriptan (MAXALT-MLT) 10 MG disintegrating tablet [id number] Yes Take 1 tablet (10 mg total) by mouth as needed for migraine. May repeat in 2 hours if needed Copland, Gay Filler, MD Taking Active   senna-docusate (SENOKOT-S) 8.6-50 MG tablet [id number] Yes Take 2 tablets by mouth daily as needed for mild constipation. [provider] Taking Active   UNABLE TO FIND [id number] Yes Take 2.4 mg by mouth daily. Med Name: Lithium OTC [provider] Taking Active            Med Note  Ebony Hail, AMBER B   Thu Feb 15, 2020  4:04 PM)    valACYclovir (VALTREX) 500 MG tablet [id number] Yes Take 500 mg by mouth 2 (two) times daily. [provider] Taking Active  Patient Active Problem List   Diagnosis Date Noted   Throat discomfort 03/12/2020   Hypothyroidism 11/18/2018   Fever 06/18/2018   Piriformis syndrome of right side 04/22/2017   Lateral epicondylitis of left elbow 04/06/2017   Solitary pulmonary nodule 10/29/2016   Chronic lymphocytic leukemia ([place]) 07/31/2016   Mass in neck 04/17/2016   Abdominal mass 04/17/2016   Asthma 12/12/2015   Multiple environmental allergies 12/12/2015   Chronic lower back pain 09/12/2015   Bipolar 1 disorder, depressed ([place]) 08/27/2015    Class: Chronic   Borderline personality disorder ([place]) 08/15/2015   Severe benzodiazepine use disorder ([place]) 08/15/2015   Alcohol use disorder, moderate, dependence ([place]) 08/15/2015   PTSD (post-traumatic stress disorder) 08/15/2015   History of bipolar disorder 08/15/2015   Pre-syncope 05/17/2015   Skull deformity 05/17/2015   Patellofemoral syndrome of both knees 05/02/2015    Immunization History  Administered Date(s) Administered   Influenza, Quadrivalent, Recombinant, Inj, Pf 04/28/2018   Influenza,inj,Quad PF,6+ Mos 03/20/2019   Influenza-Unspecified 04/15/2015, 05/19/2017   Moderna Sars-Covid-2 Vaccination 10/03/2019, 11/03/2019, 06/08/2020   Tdap 04/15/2015   Zoster Recombinat (Shingrix) 02/23/2019    Conditions to be addressed/monitored: Hypertriglyceridemia, Bipolar Disorder, and asthma; GERD / abdominal cramping; hypothyroidism; CLL  There are no care plans that you recently modified to display for this patient.   Medication Assistance: None required.  Patient affirms current coverage meets needs.  Patient's preferred pharmacy is:  [facility] [place], [place] - [address] [place]  [address] Phone: [phone] Fax: [facility] #[id number] [address], [place] [address] [facility] [address] [place] [address] Phone: [phone] Fax: [phone]  [facility] [place], [address] AT [place] [address] Phone: [phone] Fax: [phone]   Follow Up:  Patient agrees to Care Plan and Follow-up.  Plan: Telephone follow up appointment with care management team member scheduled for:  pharmacist in 3 to 4 months  [name], PharmD Clinical Pharmacist [place] [phone]

## 2021-02-23 DIAGNOSIS — D801 Nonfamilial hypogammaglobulinemia: Secondary | ICD-10-CM | POA: Diagnosis not present

## 2021-02-27 DIAGNOSIS — D839 Common variable immunodeficiency, unspecified: Secondary | ICD-10-CM | POA: Diagnosis not present

## 2021-02-27 DIAGNOSIS — D801 Nonfamilial hypogammaglobulinemia: Secondary | ICD-10-CM | POA: Diagnosis not present

## 2021-03-02 DIAGNOSIS — D801 Nonfamilial hypogammaglobulinemia: Secondary | ICD-10-CM | POA: Diagnosis not present

## 2021-03-04 ENCOUNTER — Other Ambulatory Visit: Payer: Self-pay | Admitting: Family Medicine

## 2021-03-04 DIAGNOSIS — Z1231 Encounter for screening mammogram for malignant neoplasm of breast: Secondary | ICD-10-CM

## 2021-03-05 ENCOUNTER — Ambulatory Visit
Admission: RE | Admit: 2021-03-05 | Discharge: 2021-03-05 | Disposition: A | Discharge status: Home / Self Care | Payer: Medicare Other | Source: Ambulatory Visit | Attending: Family Medicine | Admitting: Family Medicine

## 2021-03-05 ENCOUNTER — Encounter: Payer: Self-pay | Admitting: General Practice

## 2021-03-05 ENCOUNTER — Other Ambulatory Visit: Payer: Self-pay

## 2021-03-05 DIAGNOSIS — Z1231 Encounter for screening mammogram for malignant neoplasm of breast: Secondary | ICD-10-CM

## 2021-03-05 NOTE — Progress Notes (Signed)
[facility] Spiritual Care Note  Visited with Morgan [name] in the [place] per her request, providing empathic listening, normalization of feelings, emotional support, pastoral reflection, and affirmation of strengths as she shared and processed updates about her health and spiritual life. Her faith and prayer life continue to help her cope and make meaning of her life experiences. Her counselor Nevin Bloodgood and [facility] support person Olivia Mackie continue to be significant supports alongside the support groups that [place] participates in. She plans to phone again as needed.   [place], [place], [facility] Pager [phone] Voicemail [phone]

## 2021-03-10 ENCOUNTER — Encounter: Payer: Self-pay | Admitting: Family Medicine

## 2021-03-14 NOTE — Progress Notes (Signed)
[name] [address], [place], [place] [address] [phone] [id number] [phone]  Date:  03/17/2021   Name:  Morgan [name]   DOB:  [birth year]   MRN:  [id number]  PCP:  Darreld Mclean, MD    Chief Complaint: Rash (Lower abdomen and upper thigh, possibly from infusion? Redness, burning, itching)   History of Present Illness:  Morgan [name] is a 55 y.o. very pleasant female patient who presents with the following:  Medically complex patient here today with concern of a rash in her abdomen Her most recent visit together was in June History of CLL, hypothyroidism, mood disorder This is thought due to a local infusion reaction to her Hizentra- she started this on 6/19.  She gives the infusion subque weekly- she gives it in the lower abdomen or the thighs.  It takes about 70 minutes to infuse.  She does not tend to have any discomfort during the infusion process  She seemed to be doing well but she started getting a rash about a week ago The plan is to change her regimen to every 2 weeks but the dose will be doubled  The rash is confined to areas where she may give the infusion- lower belly and thighs  She may get some nausea but otherwise feels well No fever Hizentra is through Dr Varney Biles - [facility]   Hyst 2010 due to bleeding- no cancer  She was recently seen by [facility]-she is getting subcu replacement therapy of acalabrutinib -8/11, medicine Morgan [name] is a 55 year old with history of CLL, s/p Rituximab + Ibrutinib (last 04/2018), more recently treated with acalabrutinib, with persistent hypogammaglobulinemia (characterized by Low IgG, low IgM, and low IgA). 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 Given  she has been >1 year off rituximab, she now does meet criteria for CVID and with her recurrent infections, immunoglobulin replacement continues to be warranted. Her IgG Goal is >800, <1400, last IgG was within goal. She was switched from cuvitru to hizentra due to injection site erythema, pruritus and pain. She overall is feeling improved with hizentra but with mild erythema and some residual pain at injection site. We both continue to feel benefits of continuation outweigh risks, and we will manage mer mild side effects. Continue zofran prn for nausea, which has now resolved.  Patient Active Problem List   Diagnosis Date Noted   Throat discomfort 03/12/2020   Hypothyroidism 11/18/2018   Fever 06/18/2018   Piriformis syndrome of right side 04/22/2017   Lateral epicondylitis of left elbow 04/06/2017   Solitary pulmonary nodule 10/29/2016   Chronic lymphocytic leukemia ([place]) 07/31/2016   Mass in neck 04/17/2016   Abdominal mass 04/17/2016   Asthma 12/12/2015   Multiple environmental allergies 12/12/2015   Chronic lower back pain 09/12/2015   Bipolar 1 disorder, depressed ([place]) 08/27/2015    Class: Chronic   Borderline personality disorder ([place]) 08/15/2015   Severe benzodiazepine use disorder ([place]) 08/15/2015   Alcohol use disorder, moderate, dependence ([place]) 08/15/2015   PTSD (post-traumatic stress disorder) 08/15/2015   History of bipolar disorder 08/15/2015   Pre-syncope 05/17/2015   Skull deformity 05/17/2015   Patellofemoral syndrome of both knees 05/02/2015    Past Medical History:  Diagnosis Date   Anxiety    Arthritis  Asthma    Bipolar disorder ([place])    Bulging lumbar disc 07/19/05   Chronic headaches    Chronic lymphocytic leukemia ([place]) 07/31/2016   Colon polyps    Degenerative disorder of bone    Depression    History of borderline personality disorder    Hypothyroidism 2007   Developed after use of Lithium   IBS (irritable bowel syndrome) 1995   Pneumonia     Proctitis    Shingles 07/2018   Substance abuse ([place])    ativan last month    Past Surgical History:  Procedure Laterality Date   ABDOMINAL HYSTERECTOMY     APPENDECTOMY     BUNIONECTOMY Right 06/2012   CHOLECYSTECTOMY     OOPHORECTOMY     SHOULDER OPEN ROTATOR CUFF REPAIR Right 03/2010   TUBAL LIGATION      Social History   Tobacco Use   Smoking status: Former    Types: Cigarettes    Quit date: 07/20/1980    Years since quitting: 40.6   Smokeless tobacco: Never   Tobacco comments:    Also exposed to second hand smoke as a child  Vaping Use   Vaping Use: Never used  Substance Use Topics   Alcohol use: Yes    Alcohol/week: 0.0 standard drinks    Comment: occ   Drug use: Not Currently    Family History  Problem Relation Age of Onset   Depression Mother    Hypertension Mother    Post-traumatic stress disorder Sister    Alcohol abuse Brother    Drug abuse Brother    Post-traumatic stress disorder Brother    Thyroid disease Father    Alzheimer's disease Father    COPD Maternal Grandmother    Heart disease Maternal Grandfather    Arthritis Paternal Grandmother    Diabetes Paternal Grandmother    Depression Paternal Grandmother    Alzheimer's disease Paternal Grandmother    Heart disease Paternal Grandfather    Coronary artery disease Paternal Grandfather    Alcohol abuse Paternal Grandfather    Colon cancer Neg Hx    Breast cancer Neg Hx     Allergies  Allergen Reactions   Aripiprazole Anaphylaxis and Swelling   Ciprofloxacin Shortness Of Breath   Lamotrigine Rash and Other (See Comments)   Nitrofurantoin Hives   Other Anaphylaxis, Hives and Swelling    Walnuts and pine nuts   Peanut Oil Anaphylaxis, Hives, Swelling and Rash   Amoxicillin Hives and Rash    Has patient had a PCN reaction causing immediate rash, facial/tongue/throat swelling, SOB or lightheadedness with hypotension: Yes Has patient had a PCN reaction causing severe rash involving mucus  membranes or skin necrosis: No Has patient had a PCN reaction that required hospitalization: No Has patient had a PCN reaction occurring within the last 10 years: No If all of the above answers are "NO", then may proceed with Cephalosporin use.   Doxycycline Hives and Rash   Latex Rash and Other (See Comments)   Meloxicam Itching and Other (See Comments)    Induces mania Interacts with Lithium   Naproxen Hives and Other (See Comments)    Interacts with lithium   Pramipexole Hives and Rash   Prednisone Other (See Comments)    Interacts with Lithium   Risperidone Hives and Rash   Sulfa Antibiotics Rash    "that leaves scarring"   Tape Itching and Rash    Steri-StripsT   Cortisone Other (See Comments)    Exacerbates the mania  of her bipolar   Percocet [Oxycodone-Acetaminophen] Other (See Comments)    Severe dizziness   Shingrix [Zoster Vac Recomb Adjuvanted] Swelling    rash   Vancomycin Itching    Itching on head and at IV insertion site where Vanc running   Cuvitru [Immune Globulin (Human)] Rash    Medication list has been reviewed and updated.  Current Outpatient Medications on File Prior to Visit  Medication Sig Dispense Refill   acetaminophen (TYLENOL) 500 MG tablet Take 1,000 mg by mouth every 6 (six) hours as needed for mild pain.     albuterol (VENTOLIN HFA) 108 (90 Base) MCG/ACT inhaler Inhale 1-2 puffs into the lungs every 6 (six) hours as needed for wheezing or shortness of breath. 18 g 6   aspirin-acetaminophen-caffeine (EXCEDRIN EXTRA STRENGTH) 250-250-65 MG tablet Take 2 tablets by mouth every 6 (six) hours as needed for headache.     diphenhydrAMINE (BENADRYL) 25 MG tablet Take 1 tablet (25 mg total) by mouth every 6 (six) hours as needed for itching. (Patient taking differently: Take 25 mg by mouth every 6 (six) hours as needed for itching. Uses as premed prior to Hizentra) 20 tablet 0   EPINEPHrine 0.3 mg/0.3 mL IJ SOAJ injection Inject 0.3 mLs (0.3 mg total) into  the muscle as needed for anaphylaxis. 1 each prn   fexofenadine (ALLEGRA) 180 MG tablet Take 180 mg by mouth daily.     fluticasone furoate-vilanterol (BREO ELLIPTA) 100-25 MCG/INH AEPB Inhale 1 puff into the lungs daily. 1 each 11   folic acid (FOLVITE) 1 MG tablet Take 1 mg by mouth daily.     hyoscyamine (LEVSIN) 0.125 MG tablet Take 1 tablet (0.125 mg total) by mouth as needed for cramping. Reported on 09/24/2015 10 tablet 2   Immune Globulin, Human, (HIZENTRA) 10 GM/50ML SOLN Inject 10 g into the skin once a week.     levothyroxine (SYNTHROID) 50 MCG tablet Take 1 tablet (50 mcg total) by mouth daily before breakfast. 30 tablet 5   ondansetron (ZOFRAN) 8 MG tablet Take by mouth every 8 (eight) hours as needed for nausea or vomiting.     pantoprazole (PROTONIX) 40 MG tablet Take 40 mg by mouth daily.     rizatriptan (MAXALT-MLT) 10 MG disintegrating tablet Take 1 tablet (10 mg total) by mouth as needed for migraine. May repeat in 2 hours if needed 9 tablet 11   senna-docusate (SENOKOT-S) 8.6-50 MG tablet Take 2 tablets by mouth daily as needed for mild constipation.     UNABLE TO FIND Take 2.4 mg by mouth daily. Med Name: Lithium OTC     valACYclovir (VALTREX) 500 MG tablet Take 500 mg by mouth 2 (two) times daily.     No current facility-administered medications on file prior to visit.    Review of Systems:  As per HPI- otherwise negative.   Physical Examination: Vitals:   03/17/21 1138  BP: 122/84  Pulse: 88  Resp: 17  Temp: 97.7 F (36.5 C)  SpO2: 98%   Vitals:   03/17/21 1138  Weight: 198 lb (89.8 kg)  Height: '5\' 3"'$  (1.6 m)   Body mass index is 35.07 kg/m. Ideal Body Weight: Weight in (lb) to have BMI = 25: 140.8  GEN: no acute distress.  Obese, otherwise looks well and her normal self HEENT: Atraumatic, Normocephalic.  Ears and Nose: No external deformity. CV: RRR, No M/G/R. No JVD. No thrill. No extra heart sounds. PULM: CTA B, no wheezes, crackles, rhonchi. No  retractions. No resp. distress. No accessory muscle use. ABD: S, NT, ND, +BS. No rebound. No HSM. EXTR: No c/c/e PSYCH: Normally interactive. Conversant.  She has a confluent, discrete erythematous patch over her lower abdomen, inferior to the bellybutton.  There are similar, smaller patches on both anterior medial thighs. No oral lesions Pelvic exam performed.  Status post hysterectomy.  No vulvar or vaginal lesions  Assessment and Plan: Rash and nonspecific skin eruption  Screening for vaginal cancer - Plan: Cytology - PAP  Acquired hypothyroidism - Plan: TSH  Chronic lymphocytic leukemia ([place]) - Plan: CBC with Differential/Platelet  Morgan [name] is here today with concern of an apparent drug reaction rash.  It is confined to the areas where this medication may be given by subcutaneous infusion at home.  I do not see any evidence of more severe allergic reaction at this time.  However, advised patient that I am not prescribing this particular medication and as such do not want to advise her about if she should continue it or not.  She will consult with the prescribing physician.  We did do a pelvic exam today to rule out any involvement of the genital mucosa.  Morgan [name] asked me to go ahead and do a Pap which we are glad to do-however, realized that she is status post hysterectomy.  She was under the impression that she is to continue Pap screening.  I advised her that this is probably not necessary, but she will check with her oncologist to be sure  Recheck CBC and thyroid today  This visit occurred during the SARS-CoV-2 public health emergency.  Safety protocols were in place, including screening questions prior to the visit, additional usage of staff PPE, and extensive cleaning of exam room while observing appropriate contact time as indicated for disinfecting solutions.    Signed Lamar Blinks, MD

## 2021-03-17 ENCOUNTER — Other Ambulatory Visit (HOSPITAL_COMMUNITY)
Admission: RE | Admit: 2021-03-17 | Discharge: 2021-03-17 | Disposition: A | Discharge status: Home / Self Care | Payer: Medicare Other | Source: Ambulatory Visit | Attending: Family Medicine | Admitting: Family Medicine

## 2021-03-17 ENCOUNTER — Ambulatory Visit (INDEPENDENT_AMBULATORY_CARE_PROVIDER_SITE_OTHER): Payer: Medicare Other | Admitting: Family Medicine

## 2021-03-17 ENCOUNTER — Other Ambulatory Visit: Payer: Self-pay

## 2021-03-17 VITALS — BP 122/84 | HR 88 | Temp 97.7°F | Resp 17 | Ht 63.0 in | Wt 198.0 lb

## 2021-03-17 DIAGNOSIS — R21 Rash and other nonspecific skin eruption: Secondary | ICD-10-CM | POA: Diagnosis not present

## 2021-03-17 DIAGNOSIS — Z1151 Encounter for screening for human papillomavirus (HPV): Secondary | ICD-10-CM | POA: Insufficient documentation

## 2021-03-17 DIAGNOSIS — Z01411 Encounter for gynecological examination (general) (routine) with abnormal findings: Secondary | ICD-10-CM | POA: Insufficient documentation

## 2021-03-17 DIAGNOSIS — Z1272 Encounter for screening for malignant neoplasm of vagina: Secondary | ICD-10-CM | POA: Insufficient documentation

## 2021-03-17 DIAGNOSIS — C9110 Chronic lymphocytic leukemia of B-cell type not having achieved remission: Secondary | ICD-10-CM | POA: Diagnosis not present

## 2021-03-17 DIAGNOSIS — E039 Hypothyroidism, unspecified: Secondary | ICD-10-CM | POA: Diagnosis not present

## 2021-03-17 LAB — CBC WITH DIFFERENTIAL/PLATELET
Basophils Absolute: 0.1 10*3/uL (ref 0.0–0.1)
Basophils Relative: 1.3 % (ref 0.0–3.0)
Eosinophils Absolute: 0.2 10*3/uL (ref 0.0–0.7)
Eosinophils Relative: 3.6 % (ref 0.0–5.0)
HCT: 41.6 % (ref 36.0–46.0)
Hemoglobin: 13.9 g/dL (ref 12.0–15.0)
Lymphocytes Relative: 30 % (ref 12.0–46.0)
Lymphs Abs: 1.4 10*3/uL (ref 0.7–4.0)
MCHC: 33.3 g/dL (ref 30.0–36.0)
MCV: 83 fl (ref 78.0–100.0)
Monocytes Absolute: 0.5 10*3/uL (ref 0.1–1.0)
Monocytes Relative: 10.8 % (ref 3.0–12.0)
Neutro Abs: 2.5 10*3/uL (ref 1.4–7.7)
Neutrophils Relative %: 54.3 % (ref 43.0–77.0)
Platelets: 214 10*3/uL (ref 150.0–400.0)
RBC: 5.02 Mil/uL (ref 3.87–5.11)
RDW: 14.5 % (ref 11.5–15.5)
WBC: 4.5 10*3/uL (ref 4.0–10.5)

## 2021-03-17 LAB — TSH: TSH: 3.51 u[IU]/mL (ref 0.35–5.50)

## 2021-03-17 NOTE — Patient Instructions (Addendum)
It was good to see you again today- I will be in touch with your labs and pap Can you please ask oncology about if you need to continue pap screening?  I think this might no longer be necessary for you since you had a hysterectomy

## 2021-03-18 DIAGNOSIS — L2389 Allergic contact dermatitis due to other agents: Secondary | ICD-10-CM | POA: Diagnosis not present

## 2021-03-18 LAB — CYTOLOGY - PAP
Comment: NEGATIVE
Diagnosis: NEGATIVE
High risk HPV: NEGATIVE

## 2021-03-20 ENCOUNTER — Encounter: Payer: Self-pay | Admitting: General Practice

## 2021-03-20 DIAGNOSIS — T4995XD Adverse effect of unspecified topical agent, subsequent encounter: Secondary | ICD-10-CM | POA: Diagnosis not present

## 2021-03-20 DIAGNOSIS — D801 Nonfamilial hypogammaglobulinemia: Secondary | ICD-10-CM | POA: Diagnosis not present

## 2021-03-20 DIAGNOSIS — D839 Common variable immunodeficiency, unspecified: Secondary | ICD-10-CM | POA: Diagnosis not present

## 2021-03-20 NOTE — Progress Notes (Signed)
[facility] Spiritual Care Note  Returned call from [place], offering emotional support and pastoral encouragement as she worked through the frustrations of an allergic reaction to medical tape and the challenges of steroid cream's side effects. Having to maintain such frequent contact with her medical providers about her body's reactivity has become a source of mental and emotional strain. Deep breathing and creative visualization techniques helped settle her anxiety. We plan to follow up at our scheduled video visit tomorrow.   [place], [place], [facility] Pager [phone] Voicemail [phone]

## 2021-03-21 ENCOUNTER — Encounter: Payer: Self-pay | Admitting: General Practice

## 2021-03-21 NOTE — Progress Notes (Signed)
[facility] Note  Met with Morgan [name] via Webex for spiritual and emotional support, particularly as she processed decisions about treating side effects and plans for a special spiritual ceremony in February. Despite not feeling great physically (tiredness and nausea this morning, plus ongoing abdominal rash), she presented as upbeat and engaged. We plan a follow-up video visit next week for more processing per her request.   Chaplain Shelva Majestic, [facility] Pager [phone] Voicemail [phone]

## 2021-03-23 DIAGNOSIS — D801 Nonfamilial hypogammaglobulinemia: Secondary | ICD-10-CM | POA: Diagnosis not present

## 2021-03-25 ENCOUNTER — Encounter: Payer: Self-pay | Admitting: Family Medicine

## 2021-03-25 DIAGNOSIS — Z959 Presence of cardiac and vascular implant and graft, unspecified: Secondary | ICD-10-CM | POA: Diagnosis not present

## 2021-03-25 DIAGNOSIS — Z7689 Persons encountering health services in other specified circumstances: Secondary | ICD-10-CM

## 2021-04-03 DIAGNOSIS — D801 Nonfamilial hypogammaglobulinemia: Secondary | ICD-10-CM | POA: Diagnosis not present

## 2021-04-03 DIAGNOSIS — Z4509 Encounter for adjustment and management of other cardiac device: Secondary | ICD-10-CM | POA: Diagnosis not present

## 2021-04-03 DIAGNOSIS — R002 Palpitations: Secondary | ICD-10-CM | POA: Diagnosis not present

## 2021-04-03 DIAGNOSIS — Z959 Presence of cardiac and vascular implant and graft, unspecified: Secondary | ICD-10-CM | POA: Diagnosis not present

## 2021-04-03 DIAGNOSIS — I493 Ventricular premature depolarization: Secondary | ICD-10-CM | POA: Diagnosis not present

## 2021-04-03 DIAGNOSIS — Z23 Encounter for immunization: Secondary | ICD-10-CM | POA: Diagnosis not present

## 2021-04-03 DIAGNOSIS — D839 Common variable immunodeficiency, unspecified: Secondary | ICD-10-CM | POA: Diagnosis not present

## 2021-04-04 ENCOUNTER — Encounter: Payer: Self-pay | Admitting: General Practice

## 2021-04-04 NOTE — Progress Notes (Signed)
[facility] Spiritual Care Note  Met with Morgan [name] for Spiritual Care Virtual Visit via webex as planned.  Per her request for a spiritual activity, I led her in an "[place]" guided meditation that is designed to decrease anxiety and increase a feeling of belonging, belovedness, and connection, all goals of hers. Morgan [name] found it helpful even though she didn't quite "get there" to the fully desired result; she requests that we repeat the experience to continue practicing creative visualization toward those healthy, uplifting feelings.  We plan to meet again via webex on Thursday 9/22 at 11am.   Chaplain  , [place], [facility] Pager [phone] Voicemail [phone]  

## 2021-04-06 DIAGNOSIS — D801 Nonfamilial hypogammaglobulinemia: Secondary | ICD-10-CM | POA: Diagnosis not present

## 2021-04-07 DIAGNOSIS — C9110 Chronic lymphocytic leukemia of B-cell type not having achieved remission: Secondary | ICD-10-CM | POA: Diagnosis not present

## 2021-04-07 DIAGNOSIS — E611 Iron deficiency: Secondary | ICD-10-CM | POA: Diagnosis not present

## 2021-04-07 DIAGNOSIS — D801 Nonfamilial hypogammaglobulinemia: Secondary | ICD-10-CM | POA: Diagnosis not present

## 2021-04-09 ENCOUNTER — Ambulatory Visit (INDEPENDENT_AMBULATORY_CARE_PROVIDER_SITE_OTHER): Payer: Medicare Other | Admitting: Family Medicine

## 2021-04-09 ENCOUNTER — Encounter: Payer: Self-pay | Admitting: Family Medicine

## 2021-04-09 ENCOUNTER — Other Ambulatory Visit: Payer: Self-pay

## 2021-04-09 VITALS — BP 111/76 | HR 102 | Temp 98.6°F | Ht 63.5 in | Wt 195.0 lb

## 2021-04-09 DIAGNOSIS — L282 Other prurigo: Secondary | ICD-10-CM

## 2021-04-09 DIAGNOSIS — L089 Local infection of the skin and subcutaneous tissue, unspecified: Secondary | ICD-10-CM

## 2021-04-09 MED ORDER — CEPHALEXIN 500 MG PO CAPS: 500.0000 mg | ORAL_CAPSULE | Freq: Four times a day (QID) | ORAL | 0 refills | Status: DC

## 2021-04-09 NOTE — Patient Instructions (Signed)
Start triamcinolone twice daily on your arms as well.   Things to look out for: increasing pain not relieved by ibuprofen/acetaminophen, fevers, continued spreading redness, drainage of pus, or foul odor.  Let us know if you need anything.

## 2021-04-09 NOTE — Progress Notes (Signed)
Chief Complaint  Patient presents with   Insect Bite   Chills    Morgan [name] is a 55 y.o. female here for a skin complaint.  Duration: 2 days Location: both arms Pruritic? Yes Painful? Yes Drainage? No New soaps/lotions/topicals/detergents? No Sick contacts? No Other associated symptoms: chills, spreading redness Therapies tried thus far: Benadryl  Past Medical History:  Diagnosis Date   Anxiety    Arthritis    Asthma    Bipolar disorder ([place])    Bulging lumbar disc 07/19/05   Chronic headaches    Chronic lymphocytic leukemia ([place]) 07/31/2016   Colon polyps    Degenerative disorder of bone    Depression    History of borderline personality disorder    Hypothyroidism 2007   Developed after use of Lithium   IBS (irritable bowel syndrome) 1995   Pneumonia    Proctitis    Shingles 07/2018   Substance abuse ([place])    ativan last month    BP 111/76   Pulse (!) 102   Temp 98.6 F (37 C) (Oral)   Ht 5' 3.5" (1.613 m)   Wt 195 lb (88.5 kg)   SpO2 99%   BMI 34.00 kg/m  Gen: awake, alert, appearing stated age Lungs: No accessory muscle use Skin: on RUE, there is a bullae on the dorsum of the R forearm and streaking redness up the RUE with induration, warmth, ttp. On posterior L forearm, there is a small vesicle.  Psych: Age appropriate judgment and insight  Skin infection - Plan: cephALEXin (KEFLEX) 500 MG capsule  Pruritic rash  Keflex for possible infection. No nodules, looks/sounds like lymphangitis. Triamcinolone for pruritis, she has some at home.  F/u prn. The patient voiced understanding and agreement to the plan.  [place], DO 04/09/21 3:25 PM

## 2021-04-16 ENCOUNTER — Telehealth: Payer: Self-pay | Admitting: Family Medicine

## 2021-04-16 DIAGNOSIS — L089 Local infection of the skin and subcutaneous tissue, unspecified: Secondary | ICD-10-CM

## 2021-04-16 MED ORDER — CEPHALEXIN 500 MG PO CAPS: 500.0000 mg | ORAL_CAPSULE | Freq: Four times a day (QID) | ORAL | 0 refills | Status: DC

## 2021-04-16 NOTE — Telephone Encounter (Signed)
Please advise 

## 2021-04-16 NOTE — Telephone Encounter (Signed)
Pt. Called in stated she believes she has another bite but she isn't sure. She is on her last day of antibiotics from her last bite she was seen for on 9/21 and her symptoms had cleared until she woke up today with streaks on her upper arm and blisters. I asked did she want to be seen and she stated no. She wants to know does she need to contact her immunologist or does she need more antibiotics

## 2021-04-16 NOTE — Telephone Encounter (Signed)
Called her back- she notes that last week she had a bug bite on each arm, they seemed to both be "different"- one was more swollen and the other more red. She was seen by Dr Viona Gilmore and he started her on keflex- this seemed to work and they were getting better, but now she has a new bug bite as of yesterday.  She is concerned as her abx will run out in 1-2 days I gave her 5 days more of abx, she would like to be seen next week to check on things-she will let me know if any gets worse in the meantime.  She does not feel that she needs an urgent appointment today  Will see her on Monday 10am

## 2021-04-16 NOTE — Progress Notes (Addendum)
Subjective:   Morgan [name] is a 55 y.o. female who presents for Medicare Annual (Subsequent) preventive examination.  I connected with  Maryalyce today by a video enabled telemedicine application and verified that I am speaking with the correct person using two identifiers.  Location of patient:Home Location of provider:Work  Persons participating in the virtual visit: patient, nurse.   I discussed the limitations, risk, security and privacy concerns of evaluation and management by telemedicine. The patient expressed understanding and agreed to proceed.  Some vital signs may be absent or patient reported.    Review of Systems     Cardiac Risk Factors include: obesity (BMI >30kg/m2)     Objective:    Today's Vitals   04/17/21 0934  Weight: 191 lb (86.6 kg)  Height: 5' 3.5" (1.613 m)   Body mass index is 33.3 kg/m.  Advanced Directives 04/17/2021 12/24/2020 02/15/2020 06/04/2019 05/16/2019 02/26/2019 02/10/2019  Does Patient Have a Medical Advance Directive? Yes Yes No Yes Yes No No  Type of Paramedic of [place];Living will [place];Living will;Out of facility DNR (pink MOST or yellow form) - - Press photographer;Living will - -  Does patient want to make changes to medical advance directive? - Yes (ED - Information included in AVS) - - No - Patient declined - -  Copy of [place] in Chart? Yes - validated most recent copy scanned in chart (See row information) - - - Yes - validated most recent copy scanned in chart (See row information) - -  Would patient like information on creating a medical advance directive? - - - - - Yes (ED - Information included in AVS) -  Some encounter information is confidential and restricted. Go to Review Flowsheets activity to see all data.    Current Medications (verified) Outpatient Encounter Medications as of 04/17/2021  Medication Sig   acetaminophen (TYLENOL) 500 MG  tablet Take 1,000 mg by mouth every 6 (six) hours as needed for mild pain.   albuterol (VENTOLIN HFA) 108 (90 Base) MCG/ACT inhaler Inhale 1-2 puffs into the lungs every 6 (six) hours as needed for wheezing or shortness of breath.   aspirin-acetaminophen-caffeine (EXCEDRIN EXTRA STRENGTH) 250-250-65 MG tablet Take 2 tablets by mouth every 6 (six) hours as needed for headache.   cephALEXin (KEFLEX) 500 MG capsule Take 1 capsule (500 mg total) by mouth 4 (four) times daily for 7 days.   diphenhydrAMINE (BENADRYL) 25 MG tablet Take 1 tablet (25 mg total) by mouth every 6 (six) hours as needed for itching. (Patient taking differently: Take 25 mg by mouth every 6 (six) hours as needed for itching. Uses as premed prior to Hizentra)   EPINEPHrine 0.3 mg/0.3 mL IJ SOAJ injection Inject 0.3 mLs (0.3 mg total) into the muscle as needed for anaphylaxis.   fexofenadine (ALLEGRA) 180 MG tablet Take 180 mg by mouth daily.   fluticasone furoate-vilanterol (BREO ELLIPTA) 100-25 MCG/INH AEPB Inhale 1 puff into the lungs daily.   folic acid (FOLVITE) 1 MG tablet Take 1 mg by mouth daily.   hyoscyamine (LEVSIN) 0.125 MG tablet Take 1 tablet (0.125 mg total) by mouth as needed for cramping. Reported on 09/24/2015   levothyroxine (SYNTHROID) 50 MCG tablet Take 1 tablet (50 mcg total) by mouth daily before breakfast.   ondansetron (ZOFRAN) 8 MG tablet Take by mouth every 8 (eight) hours as needed for nausea or vomiting.   pantoprazole (PROTONIX) 40 MG tablet Take 40 mg by  mouth daily.   rizatriptan (MAXALT-MLT) 10 MG disintegrating tablet Take 1 tablet (10 mg total) by mouth as needed for migraine. May repeat in 2 hours if needed   senna-docusate (SENOKOT-S) 8.6-50 MG tablet Take 2 tablets by mouth daily as needed for mild constipation.   UNABLE TO FIND Take 2.4 mg by mouth daily. Med Name: Lithium OTC   valACYclovir (VALTREX) 500 MG tablet Take 500 mg by mouth 2 (two) times daily.   Immune Globulin, Human, (HIZENTRA) 10  GM/50ML SOLN Inject 10 g into the skin once a week. (Patient not taking: Reported on 04/17/2021)   No facility-administered encounter medications on file as of 04/17/2021.    Allergies (verified) Aripiprazole, Ciprofloxacin, Lamotrigine, Nitrofurantoin, Other, Peanut oil, Amoxicillin, Doxycycline, Latex, Meloxicam, Naproxen, Pramipexole, Prednisone, Risperidone, Sulfa antibiotics, Tape, Cortisone, Percocet [oxycodone-acetaminophen], Shingrix [zoster vac recomb adjuvanted], Vancomycin, and Cuvitru [immune globulin (human)]   History: Past Medical History:  Diagnosis Date   Anxiety    Arthritis    Asthma    Bipolar disorder ([place])    Bulging lumbar disc 07/19/05   Chronic headaches    Chronic lymphocytic leukemia ([place]) 07/31/2016   Colon polyps    Degenerative disorder of bone    Depression    History of borderline personality disorder    Hypothyroidism 2007   Developed after use of Lithium   IBS (irritable bowel syndrome) 1995   Pneumonia    Proctitis    Shingles 07/2018   Substance abuse ([place])    ativan last month   Past Surgical History:  Procedure Laterality Date   ABDOMINAL HYSTERECTOMY     APPENDECTOMY     BUNIONECTOMY Right 06/2012   CHOLECYSTECTOMY     OOPHORECTOMY     SHOULDER OPEN ROTATOR CUFF REPAIR Right 03/2010   TUBAL LIGATION     Family History  Problem Relation Age of Onset   Depression Mother    Hypertension Mother    Post-traumatic stress disorder Sister    Alcohol abuse Brother    Drug abuse Brother    Post-traumatic stress disorder Brother    Thyroid disease Father    Alzheimer's disease Father    COPD Maternal Grandmother    Heart disease Maternal Grandfather    Arthritis Paternal Grandmother    Diabetes Paternal Grandmother    Depression Paternal Grandmother    Alzheimer's disease Paternal Grandmother    Heart disease Paternal Grandfather    Coronary artery disease Paternal Grandfather    Alcohol abuse Paternal Grandfather    Colon cancer  Neg Hx    Breast cancer Neg Hx    Social History   Socioeconomic History   Marital status: Divorced    Spouse name: Not on file   Number of children: 2   Years of education: 45   Highest education level: Not on file  Occupational History    Comment: [employer]  Tobacco Use   Smoking status: Former    Types: Cigarettes    Quit date: 07/20/1980    Years since quitting: 40.7   Smokeless tobacco: Never   Tobacco comments:    Also exposed to second hand smoke as a child  Vaping Use   Vaping Use: Never used  Substance and Sexual Activity   Alcohol use: Yes    Alcohol/week: 0.0 standard drinks    Comment: occ   Drug use: Not Currently   Sexual activity: Not Currently    Birth control/protection: Surgical    Comment: intercourse age unknown,sexual partners less than 5  Other Topics Concern   Not on file  Social History Narrative   Fun: [place], travel, music, writing, walking, hiking, volunteering   Denies religious beliefs effecting health care.    Feels safe at home.    Divorced,   Children 2.  Lives home alone.   Social Determinants of Health   Financial Resource Strain: Medium Risk   Difficulty of Paying Living Expenses: Somewhat hard  Food Insecurity: Food Insecurity Present   Worried About Running Out of Food in the Last Year: Sometimes true   Ran Out of Food in the Last Year: Never true  Transportation Needs: No Transportation Needs   Lack of Transportation (Medical): No   Lack of Transportation (Non-Medical): No  Physical Activity: Insufficiently Active   Days of Exercise per Week: 2 days   Minutes of Exercise per Session: 20 min  Stress: Stress Concern Present   Feeling of Stress : To some extent  Social Connections: Moderately Isolated   Frequency of Communication with Friends and Family: Once a week   Frequency of Social Gatherings with Friends and Family: Once a week   Attends Religious Services: 1 to 4 times per year   Active Member of [employer] or Organizations: Yes    Attends Archivist Meetings: 1 to 4 times per year   Marital Status: Divorced    Tobacco Counseling Counseling given: Not Answered Tobacco comments: Also exposed to second hand smoke as a child   Clinical Intake:  Pre-visit preparation completed: Yes  Pain : 0-10 Pain Type: Acute pain Pain Location: Face (Sinus pain) Pain Onset: In the past 7 days Pain Frequency: Constant     BMI - recorded: 33.3 Nutritional Status: BMI > 30  Obese Nutritional Risks: None Diabetes: No  How often do you need to have someone help you when you read instructions, pamphlets, or other written materials from your doctor or pharmacy?: 1 - Never  Diabetic?No  Interpreter Needed?: No  Information entered by :: Caroleen Hamman LPN   Activities of Daily Living In your present state of health, do you have any difficulty performing the following activities: 04/17/2021  Hearing? N  Vision? N  Difficulty concentrating or making decisions? Y  Comment occasionally  Walking or climbing stairs? N  Dressing or bathing? N  Doing errands, shopping? N  Preparing Food and eating ? N  Using the Toilet? N  In the past six months, have you accidently leaked urine? Y  Comment occasionally  Do you have problems with loss of bowel control? N  Managing your Medications? N  Managing your Finances? N  Housekeeping or managing your Housekeeping? N  Some recent data might be hidden    Patient Care Team: Copland, Gay Filler, MD as PCP - General (Family Medicine) Binnie Rail, MD (Internal Medicine) Lanette Hampshire, MD as Referring Physician (Internal Medicine) Jeanann Lewandowsky, MD as Referring Physician (Oncology) [name], PharmD (Pharmacist) Mcarthur Rossetti, MD as Consulting Physician (Orthopedic Surgery) Rockne Coons, MD (Infectious Diseases)  Indicate any recent Medical Services you may have received from other than [facility] providers in the past year (date may be  approximate).     Assessment:   This is a routine wellness examination for [place].  Hearing/Vision screen Hearing Screening - Comments:: No issues Vision Screening - Comments:: Last eye exam-03/2021-Dr. Sharen Counter  Dietary issues and exercise activities discussed: Current Exercise Habits: Home exercise routine, Type of exercise: walking, Time (Minutes): 45, Frequency (Times/Week): 4, Weekly Exercise (Minutes/Week): 180, Intensity: Mild,  Exercise limited by: None identified   Goals Addressed             This Visit's Progress    Patient Stated       Lose some weight       Depression Screen PHQ 2/9 Scores 04/17/2021 01/02/2021 11/20/2020 05/08/2020 05/16/2019 05/05/2018 04/19/2017  PHQ - 2 Score 1 0 0 1 2 1  0  PHQ- 9 Score - - - 9 7 - -  Some encounter information is confidential and restricted. Go to Review Flowsheets activity to see all data.    Fall Risk Fall Risk  04/17/2021 01/02/2021 11/20/2020 05/16/2019 05/05/2018  Falls in the past year? 0 1 0 0 No  Number falls in past yr: 0 0 0 0 -  Injury with Fall? 0 1 0 0 -  Risk for fall due to : - - No Fall Risks - -  Follow up Falls prevention discussed - Falls evaluation completed - -    FALL RISK PREVENTION PERTAINING TO THE HOME:  Any stairs in or around the home? No  If so, are there any without handrails? No  Home free of loose throw rugs in walkways, pet beds, electrical cords, etc? Yes  Adequate lighting in your home to reduce risk of falls? No   ASSISTIVE DEVICES UTILIZED TO PREVENT FALLS:  Life alert? No  Use of a cane, walker or w/c? No  Grab bars in the bathroom? No  Shower chair or bench in shower? No  Elevated toilet seat or a handicapped toilet? No   TIMED UP AND GO:  Was the test performed? No . Phone visit   Cognitive Function:Normal cognitive status assessed by direct observation by this Nurse Health Advisor. No abnormalities found.   MMSE - Mini Mental State Exam 10/22/2015  Not completed: (No Data)      6CIT Screen 04/17/2021  What Year? 0 points  What month? 0 points  What time? 0 points  Count back from 20 0 points  Months in reverse 0 points  Repeat phrase 0 points  Total Score 0    Immunizations Immunization History  Administered Date(s) Administered   Influenza, Quadrivalent, Recombinant, Inj, Pf 04/28/2018   Influenza,inj,Quad PF,6+ Mos 03/20/2019   Influenza-Unspecified 04/15/2015, 05/19/2017   Moderna Sars-Covid-2 Vaccination 10/03/2019, 11/03/2019, 06/08/2020   Tdap 04/15/2015   Zoster Recombinat (Shingrix) 02/23/2019    TDAP status: Up to date  Flu Vaccine status: Due, Education has been provided regarding the importance of this vaccine. Advised may receive this vaccine at local pharmacy or Health Dept. Aware to provide a copy of the vaccination record if obtained from local pharmacy or Health Dept. Verbalized acceptance and understanding.  Pneumococcal vaccine status: Not yet indicated  Covid-19 vaccine status: Information provided on how to obtain vaccines. Booster due  Qualifies for Shingles Vaccine? Patient completed first does of Shingrix-had an allergic reaction.  Screening Tests Health Maintenance  Topic Date Due   Zoster Vaccines- Shingrix (2 of 2) 04/20/2019   COVID-19 Vaccine (4 - Booster for Moderna series) 08/31/2020   INFLUENZA VACCINE  02/17/2021   MAMMOGRAM  03/06/2023   PAP SMEAR-Modifier  03/17/2024   TETANUS/TDAP  04/14/2025   COLONOSCOPY (Pts 45-69yrs Insurance coverage will need to be confirmed)  09/23/2025   Hepatitis C Screening  Completed   HIV Screening  Completed   HPV VACCINES  Aged Out    Health Maintenance  Health Maintenance Due  Topic Date Due   Zoster Vaccines- Shingrix (2 of 2)  04/20/2019   COVID-19 Vaccine (4 - Booster for Moderna series) 08/31/2020   INFLUENZA VACCINE  02/17/2021    Colorectal cancer screening: Type of screening: Colonoscopy. Completed 09/24/2015. Repeat every 10 years  Mammogram status: Completed  bilateral 03/05/2021. Repeat every year  Bone Density status:Not yet indicated  Lung Cancer Screening: (Low Dose CT Chest recommended if Age 40-80 years, 30 pack-year currently smoking OR have quit w/in 15years.) does not qualify.     Additional Screening:  Hepatitis C Screening: Completed 02/25/2018  Vision Screening: Recommended annual ophthalmology exams for early detection of glaucoma and other disorders of the eye. Is the patient up to date with their annual eye exam?  Yes  Who is the provider or what is the name of the office in which the patient attends annual eye exams? Dr. Sharen Counter   Dental Screening: Recommended annual dental exams for proper oral hygiene  Community Resource Referral / Chronic Care Management: CRR required this visit?  Yes  For food & clothing assistance.  CCM required this visit?  No      Plan:     I have personally reviewed and noted the following in the patient's chart:   Medical and social history Use of alcohol, tobacco or illicit drugs  Current medications and supplements including opioid prescriptions.  Functional ability and status Nutritional status Physical activity Advanced directives List of other physicians Hospitalizations, surgeries, and ER visits in previous 12 months Vitals Screenings to include cognitive, depression, and falls Referrals and appointments  In addition, I have reviewed and discussed with patient certain preventive protocols, quality metrics, and best practice recommendations. A written personalized care plan for preventive services as well as general preventive health recommendations were provided to patient.   Due to this being a telephonic visit, the after visit summary with patients personalized plan was offered to patient via mail or my-chart.  Patient would like to access on my-chart.   Marta Antu, LPN   07/25/2692  Nurse Health Advisor  Nurse Notes: None    Medical screening  examination/treatment/procedure(s) were performed by non-physician practitioner and as supervising provider I was immediately available for consultation/collaboration.  I agree with above. Marrian Salvage, FNP

## 2021-04-17 ENCOUNTER — Ambulatory Visit (INDEPENDENT_AMBULATORY_CARE_PROVIDER_SITE_OTHER): Payer: Medicare Other

## 2021-04-17 VITALS — Ht 63.5 in | Wt 191.0 lb

## 2021-04-17 DIAGNOSIS — Z0000 Encounter for general adult medical examination without abnormal findings: Secondary | ICD-10-CM | POA: Diagnosis not present

## 2021-04-17 NOTE — Progress Notes (Deleted)
[facility] [address] [place], [place] [address] [phone]  Date:  04/21/2021   Name:  Morgan [name]   DOB:  1966/05/21   MRN:  [id number]  PCP:  Darreld Mclean, MD    Chief Complaint: No chief complaint on file.   History of Present Illness:  Morgan [name] is a 55 y.o. very pleasant female patient who presents with the following:  Seen today for concern of possible bug bites- see phone note 9/28 CLL, being treated by [facility] currently Most recent visit 9/19- see chart   Patient Active Problem List   Diagnosis Date Noted   Throat discomfort 03/12/2020   Hypothyroidism 11/18/2018   Fever 06/18/2018   Piriformis syndrome of right side 04/22/2017   Lateral epicondylitis of left elbow 04/06/2017   Solitary pulmonary nodule 10/29/2016   Chronic lymphocytic leukemia ([place]) 07/31/2016   Mass in neck 04/17/2016   Abdominal mass 04/17/2016   Asthma 12/12/2015   Multiple environmental allergies 12/12/2015   Chronic lower back pain 09/12/2015   Bipolar 1 disorder, depressed ([place]) 08/27/2015    Class: Chronic   Borderline personality disorder ([place]) 08/15/2015   Severe benzodiazepine use disorder ([place]) 08/15/2015   Alcohol use disorder, moderate, dependence ([place]) 08/15/2015   PTSD (post-traumatic stress disorder) 08/15/2015   History of bipolar disorder 08/15/2015   Pre-syncope 05/17/2015   Skull deformity 05/17/2015   Patellofemoral syndrome of both knees 05/02/2015    Past Medical History:  Diagnosis Date   Anxiety    Arthritis    Asthma    Bipolar disorder ([place])    Bulging lumbar disc 07/19/05   Chronic headaches    Chronic lymphocytic leukemia ([place]) 07/31/2016   Colon polyps    Degenerative disorder of bone    Depression    History of borderline personality disorder    Hypothyroidism 2007   Developed after use of Lithium   IBS (irritable bowel syndrome) 1995   Pneumonia    Proctitis     Shingles 07/2018   Substance abuse ([place])    ativan last month    Past Surgical History:  Procedure Laterality Date   ABDOMINAL HYSTERECTOMY     APPENDECTOMY     BUNIONECTOMY Right 06/2012   CHOLECYSTECTOMY     OOPHORECTOMY     SHOULDER OPEN ROTATOR CUFF REPAIR Right 03/2010   TUBAL LIGATION      Social History   Tobacco Use   Smoking status: Former    Types: Cigarettes    Quit date: 07/20/1980    Years since quitting: 40.7   Smokeless tobacco: Never   Tobacco comments:    Also exposed to second hand smoke as a child  Vaping Use   Vaping Use: Never used  Substance Use Topics   Alcohol use: Yes    Alcohol/week: 0.0 standard drinks    Comment: occ   Drug use: Not Currently    Family History  Problem Relation Age of Onset   Depression Mother    Hypertension Mother    Post-traumatic stress disorder Sister    Alcohol abuse Brother    Drug abuse Brother    Post-traumatic stress disorder Brother    Thyroid disease Father    Alzheimer's disease Father    COPD Maternal Grandmother    Heart disease Maternal Grandfather    Arthritis Paternal Grandmother    Diabetes Paternal Grandmother    Depression Paternal Grandmother    Alzheimer's disease  Paternal Grandmother    Heart disease Paternal Grandfather    Coronary artery disease Paternal Grandfather    Alcohol abuse Paternal Grandfather    Colon cancer Neg Hx    Breast cancer Neg Hx     Allergies  Allergen Reactions   Aripiprazole Anaphylaxis and Swelling   Ciprofloxacin Shortness Of Breath   Lamotrigine Rash and Other (See Comments)   Nitrofurantoin Hives   Other Anaphylaxis, Hives and Swelling    Walnuts and pine nuts   Peanut Oil Anaphylaxis, Hives, Swelling and Rash   Amoxicillin Hives and Rash    Update - pt states she has been tested by her allergist and is no longer allergic to penicillin 03/2021   Doxycycline Hives and Rash   Latex Rash and Other (See Comments)   Meloxicam Itching and Other (See Comments)     Induces mania Interacts with Lithium   Naproxen Hives and Other (See Comments)    Interacts with lithium   Pramipexole Hives and Rash   Prednisone Other (See Comments)    Interacts with Lithium   Risperidone Hives and Rash   Sulfa Antibiotics Rash    "that leaves scarring"   Tape Itching and Rash    Steri-StripsT   Cortisone Other (See Comments)    Exacerbates the mania of her bipolar   Percocet [Oxycodone-Acetaminophen] Other (See Comments)    Severe dizziness   Shingrix [Zoster Vac Recomb Adjuvanted] Swelling    rash   Vancomycin Itching    Itching on head and at IV insertion site where Vanc running   Cuvitru [Immune Globulin (Human)] Rash    Medication list has been reviewed and updated.  Current Outpatient Medications on File Prior to Visit  Medication Sig Dispense Refill   acetaminophen (TYLENOL) 500 MG tablet Take 1,000 mg by mouth every 6 (six) hours as needed for mild pain.     albuterol (VENTOLIN HFA) 108 (90 Base) MCG/ACT inhaler Inhale 1-2 puffs into the lungs every 6 (six) hours as needed for wheezing or shortness of breath. 18 g 6   aspirin-acetaminophen-caffeine (EXCEDRIN EXTRA STRENGTH) 250-250-65 MG tablet Take 2 tablets by mouth every 6 (six) hours as needed for headache.     cephALEXin (KEFLEX) 500 MG capsule Take 1 capsule (500 mg total) by mouth 4 (four) times daily for 7 days. 20 capsule 0   diphenhydrAMINE (BENADRYL) 25 MG tablet Take 1 tablet (25 mg total) by mouth every 6 (six) hours as needed for itching. (Patient taking differently: Take 25 mg by mouth every 6 (six) hours as needed for itching. Uses as premed prior to Hizentra) 20 tablet 0   EPINEPHrine 0.3 mg/0.3 mL IJ SOAJ injection Inject 0.3 mLs (0.3 mg total) into the muscle as needed for anaphylaxis. 1 each prn   fexofenadine (ALLEGRA) 180 MG tablet Take 180 mg by mouth daily.     fluticasone furoate-vilanterol (BREO ELLIPTA) 100-25 MCG/INH AEPB Inhale 1 puff into the lungs daily. 1 each 11    folic acid (FOLVITE) 1 MG tablet Take 1 mg by mouth daily.     hyoscyamine (LEVSIN) 0.125 MG tablet Take 1 tablet (0.125 mg total) by mouth as needed for cramping. Reported on 09/24/2015 10 tablet 2   Immune Globulin, Human, (HIZENTRA) 10 GM/50ML SOLN Inject 10 g into the skin once a week. (Patient not taking: Reported on 04/17/2021)     levothyroxine (SYNTHROID) 50 MCG tablet Take 1 tablet (50 mcg total) by mouth daily before breakfast. 30 tablet 5  ondansetron (ZOFRAN) 8 MG tablet Take by mouth every 8 (eight) hours as needed for nausea or vomiting.     pantoprazole (PROTONIX) 40 MG tablet Take 40 mg by mouth daily.     rizatriptan (MAXALT-MLT) 10 MG disintegrating tablet Take 1 tablet (10 mg total) by mouth as needed for migraine. May repeat in 2 hours if needed 9 tablet 11   senna-docusate (SENOKOT-S) 8.6-50 MG tablet Take 2 tablets by mouth daily as needed for mild constipation.     UNABLE TO FIND Take 2.4 mg by mouth daily. Med Name: Lithium OTC     valACYclovir (VALTREX) 500 MG tablet Take 500 mg by mouth 2 (two) times daily.     No current facility-administered medications on file prior to visit.    Review of Systems:  As per HPI- otherwise negative.   Physical Examination: There were no vitals filed for this visit. There were no vitals filed for this visit. There is no height or weight on file to calculate BMI. Ideal Body Weight:    GEN: no acute distress. HEENT: Atraumatic, Normocephalic.  Ears and Nose: No external deformity. CV: RRR, No M/G/R. No JVD. No thrill. No extra heart sounds. PULM: CTA B, no wheezes, crackles, rhonchi. No retractions. No resp. distress. No accessory muscle use. ABD: S, NT, ND, +BS. No rebound. No HSM. EXTR: No c/c/e PSYCH: Normally interactive. Conversant.    Assessment and Plan: ***  Signed Lamar Blinks, MD

## 2021-04-17 NOTE — Patient Instructions (Signed)
Morgan [name] , Thank you for taking time to complete your Medicare Wellness Visit. I appreciate your ongoing commitment to your health goals. Please review the following plan we discussed and let me know if I can assist you in the future.   Screening recommendations/referrals: Colonoscopy: Completed 09/24/2015-Due 09/23/2025 Mammogram: Completed 03/05/2021-Due 03/05/2022 Bone Density: Due at age 55 Recommended yearly ophthalmology/optometry visit for glaucoma screening and checkup Recommended yearly dental visit for hygiene and checkup  Vaccinations: Influenza vaccine: Due-May obtain vaccine at our office or your local pharmacy. Pneumococcal vaccine: Due at age 53 Tdap vaccine: Up to date-Due-04/14/2025 Shingles vaccine: Allergic  Covid-19: Booster available at the pharmacy  Advanced directives: Copy in chart  Conditions/risks identified: See problem list  Next appointment: Follow up in one year for your annual wellness visit. 04/22/2022 @ 11:00  Preventive Care 40-64 Years, Female Preventive care refers to lifestyle choices and visits with your health care provider that can promote health and wellness. What does preventive care include? A yearly physical exam. This is also called an annual well check. Dental exams once or twice a year. Routine eye exams. Ask your health care provider how often you should have your eyes checked. Personal lifestyle choices, including: Daily care of your teeth and gums. Regular physical activity. Eating a healthy diet. Avoiding tobacco and drug use. Limiting alcohol use. Practicing safe sex. Taking low-dose aspirin daily starting at age 21. Taking vitamin and mineral supplements as recommended by your health care provider. What happens during an annual well check? The services and screenings done by your health care provider during your annual well check will depend on your age, overall health, lifestyle risk factors, and family history of  disease. Counseling  Your health care provider may ask you questions about your: Alcohol use. Tobacco use. Drug use. Emotional well-being. Home and relationship well-being. Sexual activity. Eating habits. Work and work Statistician. Method of birth control. Menstrual cycle. Pregnancy history. Screening  You may have the following tests or measurements: Height, weight, and BMI. Blood pressure. Lipid and cholesterol levels. These may be checked every 5 years, or more frequently if you are over 38 years old. Skin check. Lung cancer screening. You may have this screening every year starting at age 28 if you have a 30-pack-year history of smoking and currently smoke or have quit within the past 15 years. Fecal occult blood test (FOBT) of the stool. You may have this test every year starting at age 80. Flexible sigmoidoscopy or colonoscopy. You may have a sigmoidoscopy every 5 years or a colonoscopy every 10 years starting at age 37. Hepatitis C blood test. Hepatitis B blood test. Sexually transmitted disease (STD) testing. Diabetes screening. This is done by checking your blood sugar (glucose) after you have not eaten for a while (fasting). You may have this done every 1-3 years. Mammogram. This may be done every 1-2 years. Talk to your health care provider about when you should start having regular mammograms. This may depend on whether you have a family history of breast cancer. BRCA-related cancer screening. This may be done if you have a family history of breast, ovarian, tubal, or peritoneal cancers. Pelvic exam and Pap test. This may be done every 3 years starting at age 70. Starting at age 68, this may be done every 5 years if you have a Pap test in combination with an HPV test. Bone density scan. This is done to screen for osteoporosis. You may have this scan if you are at high  risk for osteoporosis. Discuss your test results, treatment options, and if necessary, the need for more  tests with your health care provider. Vaccines  Your health care provider may recommend certain vaccines, such as: Influenza vaccine. This is recommended every year. Tetanus, diphtheria, and acellular pertussis (Tdap, Td) vaccine. You may need a Td booster every 10 years. Zoster vaccine. You may need this after age 61. Pneumococcal 13-valent conjugate (PCV13) vaccine. You may need this if you have certain conditions and were not previously vaccinated. Pneumococcal polysaccharide (PPSV23) vaccine. You may need one or two doses if you smoke cigarettes or if you have certain conditions. Talk to your health care provider about which screenings and vaccines you need and how often you need them. This information is not intended to replace advice given to you by your health care provider. Make sure you discuss any questions you have with your health care provider. Document Released: 08/02/2015 Document Revised: 03/25/2016 Document Reviewed: 05/07/2015 Elsevier Interactive Patient Education  2017 [place] Prevention in the Home Falls can cause injuries. They can happen to people of all ages. There are many things you can do to make your home safe and to help prevent falls. What can I do on the outside of my home? Regularly fix the edges of walkways and driveways and fix any cracks. Remove anything that might make you trip as you walk through a door, such as a raised step or threshold. Trim any bushes or trees on the path to your home. Use bright outdoor lighting. Clear any walking paths of anything that might make someone trip, such as rocks or tools. Regularly check to see if handrails are loose or broken. Make sure that both sides of any steps have handrails. Any raised decks and porches should have guardrails on the edges. Have any leaves, snow, or ice cleared regularly. Use sand or salt on walking paths during winter. Clean up any spills in your garage right away. This includes  oil or grease spills. What can I do in the bathroom? Use night lights. Install grab bars by the toilet and in the tub and shower. Do not use towel bars as grab bars. Use non-skid mats or decals in the tub or shower. If you need to sit down in the shower, use a plastic, non-slip stool. Keep the floor dry. Clean up any water that spills on the floor as soon as it happens. Remove soap buildup in the tub or shower regularly. Attach bath mats securely with double-sided non-slip rug tape. Do not have throw rugs and other things on the floor that can make you trip. What can I do in the bedroom? Use night lights. Make sure that you have a light by your bed that is easy to reach. Do not use any sheets or blankets that are too big for your bed. They should not hang down onto the floor. Have a firm chair that has side arms. You can use this for support while you get dressed. Do not have throw rugs and other things on the floor that can make you trip. What can I do in the kitchen? Clean up any spills right away. Avoid walking on wet floors. Keep items that you use a lot in easy-to-reach places. If you need to reach something above you, use a strong step stool that has a grab bar. Keep electrical cords out of the way. Do not use floor polish or wax that makes floors slippery. If you  must use wax, use non-skid floor wax. Do not have throw rugs and other things on the floor that can make you trip. What can I do with my stairs? Do not leave any items on the stairs. Make sure that there are handrails on both sides of the stairs and use them. Fix handrails that are broken or loose. Make sure that handrails are as long as the stairways. Check any carpeting to make sure that it is firmly attached to the stairs. Fix any carpet that is loose or worn. Avoid having throw rugs at the top or bottom of the stairs. If you do have throw rugs, attach them to the floor with carpet tape. Make sure that you have a light  switch at the top of the stairs and the bottom of the stairs. If you do not have them, ask someone to add them for you. What else can I do to help prevent falls? Wear shoes that: Do not have high heels. Have rubber bottoms. Are comfortable and fit you well. Are closed at the toe. Do not wear sandals. If you use a stepladder: Make sure that it is fully opened. Do not climb a closed stepladder. Make sure that both sides of the stepladder are locked into place. Ask someone to hold it for you, if possible. Clearly mark and make sure that you can see: Any grab bars or handrails. First and last steps. Where the edge of each step is. Use tools that help you move around (mobility aids) if they are needed. These include: Canes. Walkers. Scooters. Crutches. Turn on the lights when you go into a dark area. Replace any light bulbs as soon as they burn out. Set up your furniture so you have a clear path. Avoid moving your furniture around. If any of your floors are uneven, fix them. If there are any pets around you, be aware of where they are. Review your medicines with your doctor. Some medicines can make you feel dizzy. This can increase your chance of falling. Ask your doctor what other things that you can do to help prevent falls. This information is not intended to replace advice given to you by your health care provider. Make sure you discuss any questions you have with your health care provider. Document Released: 05/02/2009 Document Revised: 12/12/2015 Document Reviewed: 08/10/2014 Elsevier Interactive Patient Education  2017 [employer].

## 2021-04-21 ENCOUNTER — Ambulatory Visit: Payer: Medicare Other | Admitting: Family Medicine

## 2021-04-22 ENCOUNTER — Other Ambulatory Visit: Payer: Self-pay

## 2021-04-23 ENCOUNTER — Ambulatory Visit (INDEPENDENT_AMBULATORY_CARE_PROVIDER_SITE_OTHER): Payer: Medicare Other | Admitting: Family Medicine

## 2021-04-23 VITALS — BP 119/71 | HR 82 | Temp 98.2°F | Resp 18 | Ht 64.0 in | Wt 194.8 lb

## 2021-04-23 DIAGNOSIS — Z0000 Encounter for general adult medical examination without abnormal findings: Secondary | ICD-10-CM

## 2021-04-23 DIAGNOSIS — R079 Chest pain, unspecified: Secondary | ICD-10-CM

## 2021-04-23 DIAGNOSIS — R635 Abnormal weight gain: Secondary | ICD-10-CM | POA: Diagnosis not present

## 2021-04-23 DIAGNOSIS — L270 Generalized skin eruption due to drugs and medicaments taken internally: Secondary | ICD-10-CM

## 2021-04-23 MED ORDER — PREDNISONE 20 MG PO TABS: ORAL_TABLET | ORAL | 0 refills | Status: DC

## 2021-04-23 NOTE — Progress Notes (Signed)
[facility] [place], [place], [place] [id number] [phone]  Date:  04/23/2021   Name:  Morgan [name]   DOB:  1965-10-07   MRN:  [id number]  PCP:  Darreld Mclean, MD    Chief Complaint: Annual Exam (Concerns/ questions: hives- called after hours and spoke to triage took antihistamine around 9 am. Started yesterday 04/22/21. /Flu shot today: will discuss/Zoster: will discuss)   History of Present Illness:  Morgan [name] is a 55 y.o. very pleasant female patient who presents with the following:  Pt seen today for a physical exam Last seen by myself in August  History of CLL, hypothyroidism, mood disorder  She notes that yesterday she awoke with a rash on her chest- however it seemed to develop more into hives as the day went on  It is itchy-today she notes more itching but the rash is flat.  No shortness of breath or wheezing She took some benadryl last night She feel like it is worse this am  She recently took a course of Keflex for possible infected insect bites.  She is finishing his course up today  Mammo UTD Colon 2017 S/p hysterectomy   She saw her electrophysiologist last month - Dr Edwin Dada  She is now having concern of "twinges" in the left anterior chest They may last up to about 2 minutes She noted it maybe 3 times over this past weekend-last occurred a few days ago She was at rest when this occurred   She had a cardiac MRI 6/21 per [facility] as below 1. The left ventricle is normal in cavity size and wall thickness. Global systolic function is normal with  an LV ejection fraction calculated at 66%. There are no regional wall motion abnormalities.  2. The right ventricle is normal in cavity size, wall thickness, and systolic function. There are no RV  wall motion abnormalities or aneurysms. There are no findings consistent with the proposed modified MRI  task force criteria for [facility] Beverely Low et al. Eur  Heart J 2010).  3. Both atria are normal in size.  4. The aortic valve is trileaflet in morphology. There is no significant aortic valve stenosis or  regurgitation. There is mild mitral regurgitation. There is mild tricuspid regurgitation. There are no  other significant valvular lesions.  5. Delayed enhancement imaging demonstrates no evidence of myocardial infarction, scar or infiltrative  disease.  6. No intracardiac thrombus visualized.  7. There are several cystic appearing hepatic lesions, the largest of which measures 2.2 x 1.5 cm. These  are incompletely characterized on the dedicated cardiac sequences. Dedicated imaging of this region could  be considered if clinically indicated.    She is also concerned about weight gain, notes that she has been gaining and finds it difficult to lose weight.  We checked her thyroid in August and it was in normal range Wt Readings from Last 3 Encounters:  04/23/21 194 lb 12.8 oz (88.4 kg)  04/17/21 191 lb (86.6 kg)  04/09/21 195 lb (88.5 kg)   Weight 172 6/21 Patient Active Problem List   Diagnosis Date Noted   Throat discomfort 03/12/2020   Hypothyroidism 11/18/2018   Fever 06/18/2018   Piriformis syndrome of right side 04/22/2017   Lateral epicondylitis of left elbow 04/06/2017   Solitary pulmonary nodule 10/29/2016   Chronic lymphocytic leukemia ([place]) 07/31/2016   Mass in neck 04/17/2016   Abdominal mass 04/17/2016   Asthma 12/12/2015  Multiple environmental allergies 12/12/2015   Chronic lower back pain 09/12/2015   Bipolar 1 disorder, depressed ([place]) 08/27/2015    Class: Chronic   Borderline personality disorder ([place]) 08/15/2015   Severe benzodiazepine use disorder ([place]) 08/15/2015   Alcohol use disorder, moderate, dependence ([place]) 08/15/2015   PTSD (post-traumatic stress disorder) 08/15/2015   History of bipolar disorder 08/15/2015   Pre-syncope 05/17/2015   Skull deformity 05/17/2015   Patellofemoral syndrome of both knees  05/02/2015    Past Medical History:  Diagnosis Date   Anxiety    Arthritis    Asthma    Bipolar disorder ([place])    Bulging lumbar disc 07/19/05   Chronic headaches    Chronic lymphocytic leukemia ([place]) 07/31/2016   Colon polyps    Degenerative disorder of bone    Depression    History of borderline personality disorder    Hypothyroidism 2007   Developed after use of Lithium   IBS (irritable bowel syndrome) 1995   Pneumonia    Proctitis    Shingles 07/2018   Substance abuse ([place])    ativan last month    Past Surgical History:  Procedure Laterality Date   ABDOMINAL HYSTERECTOMY     APPENDECTOMY     BUNIONECTOMY Right 06/2012   CHOLECYSTECTOMY     OOPHORECTOMY     SHOULDER OPEN ROTATOR CUFF REPAIR Right 03/2010   TUBAL LIGATION      Social History   Tobacco Use   Smoking status: Former    Types: Cigarettes    Quit date: 07/20/1980    Years since quitting: 40.7   Smokeless tobacco: Never   Tobacco comments:    Also exposed to second hand smoke as a child  Vaping Use   Vaping Use: Never used  Substance Use Topics   Alcohol use: Yes    Alcohol/week: 0.0 standard drinks    Comment: occ   Drug use: Not Currently    Family History  Problem Relation Age of Onset   Depression Mother    Hypertension Mother    Post-traumatic stress disorder Sister    Alcohol abuse Brother    Drug abuse Brother    Post-traumatic stress disorder Brother    Thyroid disease Father    Alzheimer's disease Father    COPD Maternal Grandmother    Heart disease Maternal Grandfather    Arthritis Paternal Grandmother    Diabetes Paternal Grandmother    Depression Paternal Grandmother    Alzheimer's disease Paternal Grandmother    Heart disease Paternal Grandfather    Coronary artery disease Paternal Grandfather    Alcohol abuse Paternal Grandfather    Colon cancer Neg Hx    Breast cancer Neg Hx     Allergies  Allergen Reactions   Aripiprazole Anaphylaxis and Swelling    Ciprofloxacin Shortness Of Breath   Lamotrigine Rash and Other (See Comments)   Nitrofurantoin Hives   Other Anaphylaxis, Hives and Swelling    Walnuts and pine nuts   Peanut Oil Anaphylaxis, Hives, Swelling and Rash   Amoxicillin Hives and Rash    Update - pt states she has been tested by her allergist and is no longer allergic to penicillin 03/2021   Doxycycline Hives and Rash   Latex Rash and Other (See Comments)   Meloxicam Itching and Other (See Comments)    Induces mania Interacts with Lithium   Naproxen Hives and Other (See Comments)    Interacts with lithium   Pramipexole Hives and Rash   Prednisone Other (  See Comments)    Interacts with Lithium   Risperidone Hives and Rash   Sulfa Antibiotics Rash    "that leaves scarring"   Tape Itching and Rash    Steri-StripsT   Cortisone Other (See Comments)    Exacerbates the mania of her bipolar   Percocet [Oxycodone-Acetaminophen] Other (See Comments)    Severe dizziness   Shingrix [Zoster Vac Recomb Adjuvanted] Swelling    rash   Vancomycin Itching    Itching on head and at IV insertion site where Vanc running   Cuvitru [Immune Globulin (Human)] Rash    Medication list has been reviewed and updated.  Current Outpatient Medications on File Prior to Visit  Medication Sig Dispense Refill   acetaminophen (TYLENOL) 500 MG tablet Take 1,000 mg by mouth every 6 (six) hours as needed for mild pain.     albuterol (VENTOLIN HFA) 108 (90 Base) MCG/ACT inhaler Inhale 1-2 puffs into the lungs every 6 (six) hours as needed for wheezing or shortness of breath. 18 g 6   aspirin-acetaminophen-caffeine (EXCEDRIN EXTRA STRENGTH) 250-250-65 MG tablet Take 2 tablets by mouth every 6 (six) hours as needed for headache.     cephALEXin (KEFLEX) 500 MG capsule Take 1 capsule (500 mg total) by mouth 4 (four) times daily for 7 days. 20 capsule 0   diphenhydrAMINE (BENADRYL) 25 MG tablet Take 1 tablet (25 mg total) by mouth every 6 (six) hours as needed  for itching. (Patient taking differently: Take 25 mg by mouth every 6 (six) hours as needed for itching. Uses as premed prior to Hizentra) 20 tablet 0   EPINEPHrine 0.3 mg/0.3 mL IJ SOAJ injection Inject 0.3 mLs (0.3 mg total) into the muscle as needed for anaphylaxis. 1 each prn   fexofenadine (ALLEGRA) 180 MG tablet Take 180 mg by mouth daily.     fluticasone furoate-vilanterol (BREO ELLIPTA) 100-25 MCG/INH AEPB Inhale 1 puff into the lungs daily. 1 each 11   folic acid (FOLVITE) 1 MG tablet Take 1 mg by mouth daily.     hyoscyamine (LEVSIN) 0.125 MG tablet Take 1 tablet (0.125 mg total) by mouth as needed for cramping. Reported on 09/24/2015 10 tablet 2   Immune Globulin, Human, (HIZENTRA) 10 GM/50ML SOLN Inject 10 g into the skin once a week.     levothyroxine (SYNTHROID) 50 MCG tablet Take 1 tablet (50 mcg total) by mouth daily before breakfast. 30 tablet 5   ondansetron (ZOFRAN) 8 MG tablet Take by mouth every 8 (eight) hours as needed for nausea or vomiting.     pantoprazole (PROTONIX) 40 MG tablet Take 40 mg by mouth daily.     rizatriptan (MAXALT-MLT) 10 MG disintegrating tablet Take 1 tablet (10 mg total) by mouth as needed for migraine. May repeat in 2 hours if needed 9 tablet 11   senna-docusate (SENOKOT-S) 8.6-50 MG tablet Take 2 tablets by mouth daily as needed for mild constipation.     UNABLE TO FIND Take 2.4 mg by mouth daily. Med Name: Lithium OTC     valACYclovir (VALTREX) 500 MG tablet Take 500 mg by mouth 2 (two) times daily.     triamcinolone cream (KENALOG) 0.1 % Apply topically 2 (two) times daily.     No current facility-administered medications on file prior to visit.    Review of Systems:  As per HPI- otherwise negative.   Physical Examination: Vitals:   04/23/21 1120  BP: 119/71  Pulse: 82  Resp: 18  Temp: 98.2 F (36.8 C)  SpO2: 97%   Vitals:   04/23/21 1120  Weight: 194 lb 12.8 oz (88.4 kg)  Height: 5\' 4"  (1.626 m)   Body mass index is 33.44  kg/m. Ideal Body Weight: Weight in (lb) to have BMI = 25: 145.3  GEN: no acute distress.  Obese, looks well and her normal self HEENT: Atraumatic, Normocephalic. Bilateral TM wnl, oropharynx normal.  PEERL,EOMI.   Ears and Nose: No external deformity. CV: RRR, No M/G/R. No JVD. No thrill. No extra heart sounds. PULM: CTA B, no wheezes, crackles, rhonchi. No retractions. No resp. distress. No accessory muscle use. ABD: S, NT, ND, +BS. No rebound. No HSM. EXTR: No c/c/e PSYCH: Normally interactive. Conversant.  She has what appears to be a drug reaction rash most prominent over her upper chest, upper back and shoulders No angioedema or urticaria present She also has an erythematous, lacy edged rash over her central abdomen and anterior thighs, this rash has been present longer and is thought due to to her IVIG infusions   EKG: SR with low voltage  Compared with tracing from 1/22- no significant change noted  Assessment and Plan: Physical exam  Drug rash - Plan: predniSONE (DELTASONE) 20 MG tablet  Chest pain, unspecified type - Plan: EKG 12-Lead  Weight gain  Patient seen today for physical exam.  Encouraged healthy diet and exercise routine.  We went over her health maintenance Will not given flu shot today due to current rash  Discussed rash, I think this is most likely due to recent use of cephalexin.  She does have history of penicillin allergy though her allergist recently tested her and felt that she was no longer allergic. She has been with her course of Keflex today, we will plan to avoid this drug in the future  She mentions atypical chest pain as above She very recently had a cardiac MRI which was normal I suggested having her see her general cardiologist.  She does have an appointment early next year; if she continues to have these episodes of chest pain I asked her to call and move the appointment up.  She will also let me know in this case  Discussed her weight gain.   Advised that thyroid is normal.  Most likely weight gain is due to stress and hormonal changes of menopause.  Offered support, recommended healthy diet and exercise routine  Signed Lamar Blinks, MD

## 2021-04-23 NOTE — Patient Instructions (Addendum)
It was good to see you today- I am sorry that you have this rash!  I think it is most likely a drug rash from the cephalexin  Take benadryl when you get home- this should help  If you need to you can take the prednisone - if your rash is getting worse at all  Please call your "regular" cardiologist and schedule an appt for follow-up Let me know if you continue to have any chest twinges   Flu shot once you are better from rash

## 2021-04-24 ENCOUNTER — Encounter: Payer: Self-pay | Admitting: Family Medicine

## 2021-04-30 ENCOUNTER — Telehealth: Payer: Self-pay

## 2021-04-30 NOTE — Telephone Encounter (Signed)
   Telephone encounter was:  Successful.  04/30/2021 Name: Morgan [name] MRN: [id number] DOB: [birth year]  Morgan [name] is a 55 y.o. year old female who is a primary care patient of Copland, Gay Filler, MD . The community resource team was consulted for assistance with  food pantries and clothing.  Care guide performed the following interventions: Spoke with patient confirmed email address [email or web address]. Sent information for food pantries and clothing. Asked patient to sent confirmation email. Letter saved in Epic.  Follow Up Plan:  No further follow up planned at this time. The patient has been provided with needed resources. and received return call from patient that she received emailed information.  Helmuth Recupero, AAS Paralegal, [facility] [employer]  [address], [place] [address] [name]  ?? [phone]   [email or web address]

## 2021-05-01 ENCOUNTER — Encounter: Payer: Self-pay | Admitting: General Practice

## 2021-05-01 NOTE — Progress Notes (Signed)
[facility] Spiritual Care Note  Received and returned call from [place]. She reports feeling particularly down, citing coming off prednisone. Provided space for her to process her feelings, recent frustrations, and self-care plan (get out of the house by picking up thyroid meds and then going to [facility] for walking and/or writing, two of her most important spiritual practices). By the end of the call, she had reached her other goal of expressing some of her feelings through tears, a release she needed.  Morgan [name] plans to reach out again as needed.   [place], [place], [facility] Pager [phone] Voicemail [phone]

## 2021-05-05 ENCOUNTER — Encounter: Payer: Self-pay | Admitting: General Practice

## 2021-05-05 DIAGNOSIS — D839 Common variable immunodeficiency, unspecified: Secondary | ICD-10-CM | POA: Diagnosis not present

## 2021-05-05 DIAGNOSIS — C9110 Chronic lymphocytic leukemia of B-cell type not having achieved remission: Secondary | ICD-10-CM | POA: Diagnosis not present

## 2021-05-05 DIAGNOSIS — D801 Nonfamilial hypogammaglobulinemia: Secondary | ICD-10-CM | POA: Diagnosis not present

## 2021-05-05 NOTE — Progress Notes (Signed)
[facility] Note  Morgan [name] called for a quick mental-health check-in, reporting that she is feeling much better and more herself again. Now she is having a problem with hip pain, which is affecting her walking; she is currently on her way to [facility] for appointments and plans to present to ED if needed.   [place], [place], [facility] Pager [phone] Voicemail [phone]

## 2021-05-09 DIAGNOSIS — D801 Nonfamilial hypogammaglobulinemia: Secondary | ICD-10-CM | POA: Diagnosis not present

## 2021-05-21 ENCOUNTER — Telehealth: Payer: Self-pay | Admitting: Pharmacist

## 2021-05-21 ENCOUNTER — Ambulatory Visit (INDEPENDENT_AMBULATORY_CARE_PROVIDER_SITE_OTHER): Payer: Medicare Other | Admitting: Neurology

## 2021-05-21 ENCOUNTER — Ambulatory Visit (INDEPENDENT_AMBULATORY_CARE_PROVIDER_SITE_OTHER): Payer: Medicare Other | Admitting: Pharmacist

## 2021-05-21 ENCOUNTER — Encounter: Payer: Self-pay | Admitting: Neurology

## 2021-05-21 VITALS — BP 121/78 | HR 81 | Ht 63.5 in | Wt 197.0 lb

## 2021-05-21 DIAGNOSIS — E039 Hypothyroidism, unspecified: Secondary | ICD-10-CM

## 2021-05-21 DIAGNOSIS — R351 Nocturia: Secondary | ICD-10-CM

## 2021-05-21 DIAGNOSIS — R0689 Other abnormalities of breathing: Secondary | ICD-10-CM | POA: Diagnosis not present

## 2021-05-21 DIAGNOSIS — F39 Unspecified mood [affective] disorder: Secondary | ICD-10-CM

## 2021-05-21 DIAGNOSIS — R002 Palpitations: Secondary | ICD-10-CM

## 2021-05-21 DIAGNOSIS — Z820 Family history of epilepsy and other diseases of the nervous system: Secondary | ICD-10-CM

## 2021-05-21 DIAGNOSIS — C9110 Chronic lymphocytic leukemia of B-cell type not having achieved remission: Secondary | ICD-10-CM

## 2021-05-21 DIAGNOSIS — Z862 Personal history of diseases of the blood and blood-forming organs and certain disorders involving the immune mechanism: Secondary | ICD-10-CM

## 2021-05-21 DIAGNOSIS — R519 Headache, unspecified: Secondary | ICD-10-CM | POA: Diagnosis not present

## 2021-05-21 DIAGNOSIS — G4719 Other hypersomnia: Secondary | ICD-10-CM

## 2021-05-21 DIAGNOSIS — R0683 Snoring: Secondary | ICD-10-CM | POA: Diagnosis not present

## 2021-05-21 DIAGNOSIS — J45909 Unspecified asthma, uncomplicated: Secondary | ICD-10-CM

## 2021-05-21 DIAGNOSIS — E669 Obesity, unspecified: Secondary | ICD-10-CM

## 2021-05-21 MED ORDER — LEVOTHYROXINE SODIUM 50 MCG PO TABS: 50.0000 ug | ORAL_TABLET | Freq: Every day | ORAL | 1 refills | Status: DC

## 2021-05-21 NOTE — Patient Instructions (Signed)
CARE PLAN ENTRY  Current Barriers:  Chronic Disease Management support, education, and care coordination needs related to Asthma, Hypothyroidism, Bipolar Disorder, Leukemia, Stomach Cramping, Recurrent Shingles, Migraine, Allergy  Asthma Pharmacist Clinical Goal(s) Over the next 90 days, patient will work with PharmD and providers to reduce symptoms of asthma Current regimen:  Albuterol inhaler (ProAir) as needed Breo Ellipta 100-70mcg 1 puff daily Interventions:  Education provided regarding maintenance versus rescue inhaler and when to use.  Reviewed inhaler use / technique Patient self care activities - Over the next 120 days, patient will: Maintain asthma medication regimen Remember Memory Dance is maintenance inhaler - use every day;  Albuterol is rescue inhaler and is used when you have shortness of breath or wheezing. Place albuterol inhaler beside bed to use at night as needed for shortness of  breath.  Remember to rinse mouth after using Breo to prevent infection / thrush.   Migraine Pharmacist Clinical Goal(s) Over the next 90 days, patient will work with PharmD and providers to reduce symptoms of migraine Current regimen:  Rizatriptan 10mg  as needed Excedrin migraine as needed Interventions: none Patient self care activities - Over the next 90 days, patient will: Maintain migraine medication regimen  Hypothyroidism Pharmacist Clinical Goal(s) Over the next 90 days, patient will work with PharmD and providers to reduce symptoms of hypothyroidism Current regimen:  Levothyroxine 44mcg daily Interventions: Recommended continue current dose of levothyroxine Coordinated refills for levothyroxine for 90 day supply Patient self care activities - Over the next 90 days, patient will: Maintain thyroid medication regimen   Acid Reflux / GERD / abdominal pain:  Pharmacist Clinical Goal(s) Over the next 90 days, patient will work with PharmD and providers to reduce symptoms of acid  reflux Current regimen:  Pantoprazole 40mg  daily (had been on hold while taking Calquence) Hyoscamine 0.125mg  - take as needed for stomach cramping Interventions: Reviewed interaction between Calquence and pantoprazole Ok to continue pantoprazole while Calquence is on hold but will need alternative for reflux if Calquence is restarted.  Coordinated with PCP for refill on hyoscamine Patient self care activities - Over the next 90 days, patient will: Maintain current reflux regimen unless Calquence is restarted Make sure oncology team is aware you have restarted pantoprazole.  Pick up hyoscyamine prescription from pharmacy  Bipolar / Mood: Pharmacist Clinical Goal(s) Over the next 90 days, patient will work with PharmD and providers to maintain contact with mental health providers and maintain control of mood Current regimen:  Over the Counter lithium once daily Counseling Interventions: Endorsed continued follow up with counselors and spiritual care Patient self care activities - Over the next 90 days, patient will: Continue to meet with counselors with [facility] and [place] Team Continue to meet with chaplain with [facility] team   Medication management Pharmacist Clinical Goal(s): Over the next 90 days, patient will work with PharmD and providers to maintain optimal medication adherence Current pharmacy: [facility] Interventions Comprehensive medication review performed. Continue current medication management strategy Patient self care activities - Over the next 90 days, patient will: Focus on medication adherence by filling and taking medications appropriately  Take medications as prescribed Report any questions or concerns to PharmD and/or provider(s)  Please see past updates related to this goal by clicking on the "Past Updates" button in the selected goal  Visit Information  Patient verbalizes understanding of instructions provided today and  agrees to view in [place].   Telephone follow up appointment with care management team member scheduled for: 3  to 4 months  [name], PharmD Clinical Pharmacist [facility]

## 2021-05-21 NOTE — Telephone Encounter (Signed)
Patient requesting updated refill for levothyroxine 63mcg daily for 90 day supply (last TSH was WNL 02/2021).

## 2021-05-21 NOTE — Progress Notes (Signed)
Subjective:    Patient ID: Morgan [name] is a 55 y.o. female.  HPI    Star Age, MD, PhD [facility] [address] [place], [place] [address]  Dear Janett Billow,   I saw your patient, Morgan [name], upon your kind request in my sleep clinic today for initial consultation of her sleep disorder, in particular, concern for underlying obstructive sleep apnea.  The patient is unaccompanied today.  As you know, Morgan [name] is a 55 year old right-handed woman with an underlying medical history of chronic headaches (followed by my colleague Dr. Leta Baptist), hypothyroidism, irritable bowel syndrome, asthma, arthritis, anxiety, depression, CLL, and mild obesity, who reports snoring and excessive daytime somnolence as well as waking up with a sense of gasping.  I reviewed your office note from 04/23/2021.  Her Epworth sleepiness score is 15 out of 24, fatigue severity score is 54 out of 63.  She is divorced, she lives alone.  She has grown children.  She works as a Artist.  She is followed by cardiology at [facility] as well as hematology at [facility].  She has a loop recorder in place since June 2021.  She typically works from Saturday through Tuesday.  Bedtime is generally around 9 PM and rise time around 3 AM.  She has nocturia about 2-3 times per average night and has had recurrent morning headaches, occasionally it is a migraine in the mornings, sometimes not an actual migraine headache but responds to Excedrin.  She has sleep study several years ago, in the early 2000's for concern for restless leg syndrome, she did not have any sleep apnea at the time.  She has a history of asthma since early adulthood.  She has a maternal cousin with sleep apnea.  She drinks caffeine in the form of coffee, 2 cups daily on average, she drinks alcohol very occasionally, she quit smoking while she was still in high school.  She has had weight gain.   Her Past Medical History Is  Significant For: Past Medical History:  Diagnosis Date   Anxiety    Arthritis    Asthma    Bipolar disorder ([place])    Bulging lumbar disc 07/19/05   Chronic headaches    Chronic lymphocytic leukemia ([place]) 07/31/2016   Colon polyps    Degenerative disorder of bone    Depression    History of borderline personality disorder    Hypothyroidism 2007   Developed after use of Lithium   IBS (irritable bowel syndrome) 1995   Pneumonia    Proctitis    Shingles 07/2018   Substance abuse ([place])    ativan last month    Her Past Surgical History Is Significant For: Past Surgical History:  Procedure Laterality Date   ABDOMINAL HYSTERECTOMY     APPENDECTOMY     BUNIONECTOMY Right 06/2012   CHOLECYSTECTOMY     OOPHORECTOMY     SHOULDER OPEN ROTATOR CUFF REPAIR Right 03/2010   TUBAL LIGATION      Her Family History Is Significant For: Family History  Problem Relation Age of Onset   Depression Mother    Hypertension Mother    Thyroid disease Father    Alzheimer's disease Father    Post-traumatic stress disorder Sister    Alcohol abuse Brother    Drug abuse Brother    Post-traumatic stress disorder Brother    COPD Maternal Grandmother    Heart disease Maternal Grandfather    Arthritis Paternal Grandmother  Diabetes Paternal Grandmother    Depression Paternal Grandmother    Alzheimer's disease Paternal Grandmother    Heart disease Paternal Grandfather    Coronary artery disease Paternal Grandfather    Alcohol abuse Paternal Grandfather    Sleep apnea Cousin    Diabetes Maternal Uncle    Colon cancer Neg Hx    Breast cancer Neg Hx     Her Social History Is Significant For: Social History   Socioeconomic History   Marital status: Divorced    Spouse name: Not on file   Number of children: 2   Years of education: 14   Highest education level: Not on file  Occupational History    Comment: [employer]  Tobacco Use   Smoking status: Former    Types: Cigarettes    Quit date:  07/20/1980    Years since quitting: 40.8   Smokeless tobacco: Never   Tobacco comments:    Also exposed to second hand smoke as a child  Vaping Use   Vaping Use: Never used  Substance and Sexual Activity   Alcohol use: Yes    Alcohol/week: 0.0 standard drinks    Comment: occ   Drug use: Not Currently   Sexual activity: Not Currently    Birth control/protection: Surgical    Comment: intercourse age unknown,sexual partners less than 5  Other Topics Concern   Not on file  Social History Narrative   Fun: [place], travel, music, writing, walking, hiking, volunteering   Denies religious beliefs effecting health care.    Feels safe at home.    Divorced,   Children 2.  Lives home alone.   Right handed dominant    Caffeine: 2 cups/day   Social Determinants of Health   Financial Resource Strain: Medium Risk   Difficulty of Paying Living Expenses: Somewhat hard  Food Insecurity: Food Insecurity Present   Worried About Charity fundraiser in the Last Year: Sometimes true   Ran Out of Food in the Last Year: Never true  Transportation Needs: No Transportation Needs   Lack of Transportation (Medical): No   Lack of Transportation (Non-Medical): No  Physical Activity: Insufficiently Active   Days of Exercise per Week: 2 days   Minutes of Exercise per Session: 20 min  Stress: Stress Concern Present   Feeling of Stress : To some extent  Social Connections: Moderately Isolated   Frequency of Communication with Friends and Family: Once a week   Frequency of Social Gatherings with Friends and Family: Once a week   Attends Religious Services: 1 to 4 times per year   Active Member of [employer] or Organizations: Yes   Attends Archivist Meetings: 1 to 4 times per year   Marital Status: Divorced    Her Allergies Are:  Allergies  Allergen Reactions   Aripiprazole Anaphylaxis and Swelling   Ciprofloxacin Shortness Of Breath   Lamotrigine Rash and Other (See Comments)   Nitrofurantoin  Hives   Other Anaphylaxis, Hives and Swelling    Walnuts and pine nuts   Peanut Oil Anaphylaxis, Hives, Swelling and Rash   Amoxicillin Hives and Rash    Update - pt states she has been tested by her allergist and is no longer allergic to penicillin 03/2021   Doxycycline Hives and Rash   Latex Rash and Other (See Comments)   Meloxicam Itching and Other (See Comments)    Induces mania Interacts with Lithium   Naproxen Hives and Other (See Comments)    Interacts with lithium  Pramipexole Hives and Rash   Prednisone Other (See Comments)    Interacts with Lithium   Risperidone Hives and Rash   Sulfa Antibiotics Rash    "that leaves scarring"   Tape Itching and Rash    Steri-StripsT   Cortisone Other (See Comments)    Exacerbates the mania of her bipolar   Keflex [Cephalexin]     Pt developed a rash after taking high dose keflex for several days    Percocet [Oxycodone-Acetaminophen] Other (See Comments)    Severe dizziness   Shingrix [Zoster Vac Recomb Adjuvanted] Swelling    rash   Vancomycin Itching    Itching on head and at IV insertion site where Vanc running   Cuvitru [Immune Globulin (Human)] Rash    Also allergic to Hizentra  :   Her Current Medications Are:  Outpatient Encounter Medications as of 05/21/2021  Medication Sig   acetaminophen (TYLENOL) 500 MG tablet Take 1,000 mg by mouth every 6 (six) hours as needed for mild pain.   albuterol (VENTOLIN HFA) 108 (90 Base) MCG/ACT inhaler Inhale 1-2 puffs into the lungs every 6 (six) hours as needed for wheezing or shortness of breath.   aspirin-acetaminophen-caffeine (EXCEDRIN EXTRA STRENGTH) 250-250-65 MG tablet Take 2 tablets by mouth every 6 (six) hours as needed for headache.   diphenhydrAMINE (BENADRYL) 25 MG tablet Take 1 tablet (25 mg total) by mouth every 6 (six) hours as needed for itching. (Patient taking differently: Take 25 mg by mouth every 6 (six) hours as needed for itching. Uses as premed prior to Hizentra)    EPINEPHrine 0.3 mg/0.3 mL IJ SOAJ injection Inject 0.3 mLs (0.3 mg total) into the muscle as needed for anaphylaxis.   fexofenadine (ALLEGRA) 180 MG tablet Take 180 mg by mouth daily.   fluticasone furoate-vilanterol (BREO ELLIPTA) 100-25 MCG/INH AEPB Inhale 1 puff into the lungs daily.   folic acid (FOLVITE) 1 MG tablet Take 1 mg by mouth daily.   hyoscyamine (LEVSIN) 0.125 MG tablet Take 1 tablet (0.125 mg total) by mouth as needed for cramping. Reported on 09/24/2015   Immune Globulin 10% (GAMUNEX-C) 10 GM/100ML SOLN Inject into the vein. Every 3 weeks   levothyroxine (SYNTHROID) 50 MCG tablet Take 1 tablet (50 mcg total) by mouth daily before breakfast.   ondansetron (ZOFRAN) 8 MG tablet Take by mouth every 8 (eight) hours as needed for nausea or vomiting.   pantoprazole (PROTONIX) 40 MG tablet Take 40 mg by mouth daily.   rizatriptan (MAXALT-MLT) 10 MG disintegrating tablet Take 1 tablet (10 mg total) by mouth as needed for migraine. May repeat in 2 hours if needed   senna-docusate (SENOKOT-S) 8.6-50 MG tablet Take 2 tablets by mouth daily as needed for mild constipation.   triamcinolone cream (KENALOG) 0.1 % Apply topically 2 (two) times daily.   UNABLE TO FIND Take 2.4 mg by mouth 2 (two) times daily. Med Name: Lithium OTC   ferrous sulfate 325 (65 FE) MG tablet Take 325 mg by mouth daily with breakfast. Take vitamin C along with ferrous sulfated (Patient not taking: Reported on 05/21/2021)   valACYclovir (VALTREX) 500 MG tablet Take 500 mg by mouth 2 (two) times daily. (Patient not taking: Reported on 05/21/2021)   [DISCONTINUED] levothyroxine (SYNTHROID) 50 MCG tablet Take 1 tablet (50 mcg total) by mouth daily before breakfast.   No facility-administered encounter medications on file as of 05/21/2021.  :  Review of Systems:  Out of a complete 14 point review of systems, all are  reviewed and negative with the exception of these symptoms as listed below:  Review of Systems  Neurological:         Patient is here for a sleep consult. She has been waking up gasping. She also is tired during the day. She isn't sure if she snores as she lives alone. She states her kids have told her in the past that she snores. ESS 15, FSS 52.    Objective:  Neurological Exam  Physical Exam Physical Examination:   Vitals:   05/21/21 1507  BP: 121/78  Pulse: 81    General Examination: The patient is a very pleasant 55 y.o. female in no acute distress. She appears well-developed and well-nourished and well groomed.   HEENT: Normocephalic, atraumatic, pupils are equal, round and reactive to light, extraocular tracking is good without limitation to gaze excursion or nystagmus noted. Hearing is grossly intact. Face is symmetric with normal facial animation. Speech is clear with no dysarthria noted. There is no hypophonia. There is no lip, neck/head, jaw or voice tremor. Neck is supple with full range of passive and active motion. There are no carotid bruits on auscultation. Oropharynx exam reveals: mild mouth dryness, adequate dental hygiene and mild airway crowding, due to smaller airway entry and elongated uvula, tonsils about 1-2+ bilaterally.  Mallampati class II.  Neck circumference of 15 inches.  She has a minimal overbite.  Tongue protrudes centrally and palate elevates symmetrically.  Chest: Clear to auscultation without wheezing, rhonchi or crackles noted.  Heart: S1+S2+0, regular and normal without murmurs, rubs or gallops noted.   Abdomen: Soft, non-tender and non-distended with normal bowel sounds appreciated on auscultation.  Extremities: There is no pitting edema in the distal lower extremities bilaterally.   Skin: Warm and dry without trophic changes noted.   Musculoskeletal: exam reveals no obvious joint deformities, tenderness or joint swelling or erythema.   Neurologically:  Mental status: The patient is awake, alert and oriented in all 4 spheres. Her immediate and remote  memory, attention, language skills and fund of knowledge are appropriate. There is no evidence of aphasia, agnosia, apraxia or anomia. Speech is clear with normal prosody and enunciation. Thought process is linear. Mood is normal and affect is normal.  Cranial nerves II - XII are as described above under HEENT exam.  Motor exam: Normal bulk, strength and tone is noted. There is no tremor, fine motor skills and coordination: grossly intact.  Cerebellar testing: No dysmetria or intention tremor. There is no truncal or gait ataxia.  Sensory exam: intact to light touch in the upper and lower extremities.  Gait, station and balance: She stands easily. No veering to one side is noted. No leaning to one side is noted. Posture is age-appropriate and stance is narrow based. Gait shows normal stride length and normal pace. No problems turning are noted.   Assessment and Plan:  In summary, Morgan [name] is a very pleasant 55 y.o.-year old female with an underlying medical history of chronic headaches (followed by my colleague Dr. Leta Baptist), hypothyroidism, irritable bowel syndrome, asthma, arthritis, anxiety, depression, CLL, and mild obesity, whose history and physical exam are concerning for obstructive sleep apnea (OSA). I had a long chat with the patient about my findings and the diagnosis of OSA, its prognosis and treatment options. We talked about medical treatments, surgical interventions and non-pharmacological approaches. I explained in particular the risks and ramifications of untreated moderate to severe OSA, especially with respect to developing cardiovascular disease down the Road, including  congestive heart failure, difficult to treat hypertension, cardiac arrhythmias, or stroke. Even type 2 diabetes has, in part, been linked to untreated OSA. Symptoms of untreated OSA include daytime sleepiness, memory problems, mood irritability and mood disorder such as depression and anxiety, lack of energy, as  well as recurrent headaches, especially morning headaches. We talked about trying to maintain a healthy lifestyle in general, as well as the importance of weight control. We also talked about the importance of good sleep hygiene. I recommended the following at this time: sleep study.  I outlined the difference between a laboratory attended sleep study versus home sleep test.  I explained the sleep test procedure to the patient and also outlined possible surgical and non-surgical treatment options of OSA, including the use of a custom-made dental device (which would require a referral to a specialist dentist or oral surgeon), upper airway surgical options, such as traditional UPPP or a novel less invasive surgical option in the form of Inspire hypoglossal nerve stimulation (which would involve a referral to an ENT surgeon). I also explained the CPAP treatment option to the patient, who indicated that she would be willing to try CPAP if the need arises. I explained the importance of being compliant with PAP treatment, not only for insurance purposes but primarily to improve Her symptoms, and for the patient's long term health benefit, including to reduce Her cardiovascular risks. I answered all her questions today and the patient was in agreement. I plan to see her back after the sleep study is completed and encouraged her to call with any interim questions, concerns, problems or updates.   Thank you very much for allowing me to participate in the care of this nice patient. If I can be of any further assistance to you please do not hesitate to call me at [phone].  Sincerely,   Star Age, MD, PhD

## 2021-05-21 NOTE — Telephone Encounter (Signed)
Rx has been sent to pharmacy on file. In [place]

## 2021-05-21 NOTE — Chronic Care Management (AMB) (Signed)
Chronic Care Management Pharmacy Note  05/21/2021 Name:  Morgan [name] MRN:  [id number] DOB:  08-Feb-1966  Subjective: Morgan [name] is an 55 y.o. year old female who is a primary patient of Copland, Gay Filler, MD.  The CCM team was consulted for assistance with disease management and care coordination needs.    Engaged with patient by telephone for follow up visit in response to provider referral for pharmacy case management and/or care coordination services.   Consent to Services:  The patient was given information about Chronic Care Management services, agreed to services, and gave verbal consent prior to initiation of services.  Please see initial visit note for detailed documentation.   Patient Care Team: Copland, Gay Filler, MD as PCP - General (Family Medicine) Binnie Rail, MD (Internal Medicine) Lanette Hampshire, MD as Referring Physician (Internal Medicine) Jeanann Lewandowsky, MD as Referring Physician (Oncology) [name], RPH-CPP (Pharmacist) Mcarthur Rossetti, MD as Consulting Physician (Orthopedic Surgery) Rockne Coons, MD (Infectious Diseases)  Recent office visits: 04/23/2021 - PCP (Dr Lorelei Pont) Skin rash and physical. Prescribed prednisone 65m for 3 days, then 234mfor 3 day, then stop 04/09/2021 - PCP (Dr WeNani Ravensskin infection. Prescribed cephalexin 50078m times a day for 7 days. 03/17/2021 - PCP (Dr CopLorelei Ponteen for rash. She was recently seen by [facility] and immunology-she is getting subcu replacement therapy of acalabrutinib -8/11 01/02/2021 - PCP (Dr CopLorelei Pontospital f/u; TSH rechecked; treated for sinus infection with cefdinir 300m38md for 10 days.   Recent consult visits: 05/05/2021 - Pulm - ([facility] - [facility]) Follow up. She was on Hizentra 20 grams every other week (~500 mg/kg) - with persistent local reactions, and persistent skin irritation (even without using the tape that was thought to be triggering). Thus, we are  switching her to IVIG (40 grams q3 weeks, awaiting insurance approval). Had breakthrough infectious symptoms when dosed every 4 weeks with Dr. BranDorothea Ogler her acute lower back pain today, she is going to her local ED to be assessed given limitations here in clinic.  04/07/2021 - Oncology (Amy Tammenga, FNP-BC) Seen for CLL / hypogammaglobulinemia. She presents for a scheduled follow up OFF acalabrutinib. Since stopping acalabrutinib her muscle cramps and neuropathy symptoms have resolved. Continued on immunoglobulin replacement with immunology/allergy. May switch to home IVIG. She remains on antiviral prophylaxis at this time. Indications for IVIG were reviewed. IgG consistently <500, now on SQIG with possible plan to change to home health IVIG. Return in 2 months for PET/CT, labs and visit Recurrent Shingles Infections - Has recurrent vesicular rash to abdomen - treated with Valtrex 1000mg40m and has been on valtrex prophylaxis. Continue Valtrex 500mg 40me a day. Started Ferrous Sulfate 325mg e59m other day with source of Vitamin C/without food for iron deficiency. Continue holding acalabrutinib due to intolerable SEs. Plan PET CT scan 6 months from her most recent scans.  04/03/2021 - Cardio (Dr CockfieFarrel Demark) SRob Hickmanfor history of palpitations and chest tightness. Prior ILR implant. Palpitations are somewhat better since the last visit. Has had episodes of waking up "gasping" and said she is pending evaluation for OSA. Occasional episodes of LH, but no syncopal events.  Has documented episodes of ST on her ILR. No PVCs or PACs seen during the interrogation today. No ectopy on exam. Did not appear to be an inappropriate sinus tachycardia. We walked fairly briskly in the hallway and her rates went to the low 100s. These rates correlated with her watch. We discussed  that these are normal ADL rates. We discussed the general causes for a fast heart rates, including stress, pain, anxiety, etc. Reviewed that  her stress test and MRI were called normal. Provided reassurance that brief fast heart rates are not life threatening, but we would want to know of any sustained rates (hours-days). She was encouraged to have regular exercise. Overdue for follow-up with Card-Onc and she will make a routine appointment. Resume remote monitoring and next EP visit in a year, and PRN.  Her reported "gasping" at night very suspicious for OSA and she is pending evaluation. Encouraged to treat as needed.  04/03/2021 - Pulm (Dr Carolynn Sayers)  She is currently on Hizentra 20 grams every other week (~500 mg/kg) - with persistent local reactions. She is eager to re-trial without the tape she had previously been using. She was not able to tolerate the steroid cream triamcinolone as trialed by dermatology. If reactions persist, will need consider re-starting IVIG (but through home infusions). 03/20/2021 - Pulm (Dr Varney Biles - [facility]) -In the interim, she has seen dermatology who noted the rash was likely contact dermatitis secondary to tape. We increased her dosing to every 2 weeks due to ease of dosing at home, rather than weekly - due Sept 4. She notes she had a reaction to ointment she was given by dermatology last night (triamcinolone ointment). She was unable to lay on her stomach over night. She had throat symptoms "doing its thing" - she felt her abdomen was more swollen, she also noted chest pain. She noted the symptoms started with itching. She trialed the ointment again this AM and is so far not having symptoms. She is currently on Cuvitru 20 grams every other week (~500 mg/kg) - with local reactions (contact dermatitis 2/2 tape, managed by dermatology). Next dose is due on Sept 4: we will hold a dose if she continues to have rash at injection site and advised not to inject through rash. 02/27/2021 - Pulm (Dr Varney Biles - [facility]) Seen for history of CLL, s/p Rituximab + Ibrutinib (last 04/2018), more recently treated with  acalabrutinib, with persistent hypogammaglobulinemia (characterized by Low IgG, low IgM, and low IgA). She overall is feeling improved with Hizentra but with mild erythema and some residual pain at injection site. We both continue to feel benefits of continuation outweigh risks, and we will manage her mild side effects. Continue zofran prn for nausea, which has now resolved 02/06/2021 - [facility] (Dr Varney Biles) F/U hypogammaglobulinemia. On SQ replacement therapy 10 gram weekly. On Hizentra. Recommended long activing antihistamine on schedule and ondansetron 19m as needed for nausea.    Hospital visits: 12/24/2020 - ER Visit for Headcahe and abdominal pain. Troponin normal; negative CT of abd and head. Chest xray normal. No med changes.   Objective:  Lab Results  Component Value Date   CREATININE 0.81 12/24/2020   CREATININE 0.7 08/12/2020   CREATININE 0.87 08/01/2020    Lab Results  Component Value Date   HGBA1C 5.1 11/20/2020   Last diabetic Eye exam: No results found for: HMDIABEYEEXA  Last diabetic Foot exam: No results found for: HMDIABFOOTEX      Component Value Date/Time   CHOL 262 (H) 01/02/2021 1519   TRIG 248.0 (H) 01/02/2021 1519   HDL 77.90 01/02/2021 1519   CHOLHDL 3 01/02/2021 1519   VLDL 49.6 (H) 01/02/2021 1519   LDLCALC 103 (H) 04/27/2018 1403   LDLDIRECT 140.0 01/02/2021 1519    Hepatic Function Latest Ref Rng &  Units 12/24/2020 08/12/2020 08/01/2020  Total Protein 6.5 - 8.1 g/dL 7.3 - 6.9  Albumin 3.5 - 5.0 g/dL 4.1 - 4.7  AST 15 - 41 U/L 22 16 13   ALT 0 - 44 U/L 12 11 10   Alk Phosphatase 38 - 126 U/L 66 51 67  Total Bilirubin 0.3 - 1.2 mg/dL 0.4 - 0.4    Lab Results  Component Value Date/Time   TSH 3.51 03/17/2021 12:15 PM   TSH 2.16 01/02/2021 03:19 PM   FREET4 1.19 03/02/2018 03:24 PM   FREET4 0.65 04/17/2016 10:31 AM    CBC Latest Ref Rng & Units 03/17/2021 12/24/2020 08/12/2020  WBC 4.0 - 10.5 K/uL 4.5 5.5 8.3  Hemoglobin  12.0 - 15.0 g/dL 13.9 15.0 14.2  Hematocrit 36.0 - 46.0 % 41.6 44.9 44  Platelets 150.0 - 400.0 K/uL 214.0 251 259    Lab Results  Component Value Date/Time   VD25OH 43.91 11/20/2020 01:26 PM   VD25OH 25.73 (L) 06/17/2020 11:18 AM    Clinical ASCVD: No  The 10-year ASCVD risk score (Arnett DK, et al., 2019) is: 5.6%   Values used to calculate the score:     Age: 56 years     Sex: Female     Is Non-Hispanic African American: No     Diabetic: No     Tobacco smoker: Yes     Systolic Blood Pressure: 867 mmHg     Is BP treated: No     HDL Cholesterol: 77.9 mg/dL     Total Cholesterol: 262 mg/dL    Other: (CHADS2VASc if Afib, PHQ9 if depression, MMRC or CAT for COPD, ACT, DEXA)  Social History   Tobacco Use  Smoking Status Former   Types: Cigarettes   Quit date: 07/20/1980   Years since quitting: 40.8  Smokeless Tobacco Never  Tobacco Comments   Also exposed to second hand smoke as a child   BP Readings from Last 3 Encounters:  04/23/21 119/71  04/09/21 111/76  03/17/21 122/84   Pulse Readings from Last 3 Encounters:  04/23/21 82  04/09/21 (!) 102  03/17/21 88   Wt Readings from Last 3 Encounters:  04/23/21 194 lb 12.8 oz (88.4 kg)  04/17/21 191 lb (86.6 kg)  04/09/21 195 lb (88.5 kg)    Assessment: Review of patient past medical history, allergies, medications, health status, including review of consultants reports, laboratory and other test data, was performed as part of comprehensive evaluation and provision of chronic care management services.   SDOH:  (Social Determinants of Health) assessments and interventions performed:     CCM Care Plan  Allergies  Allergen Reactions   Aripiprazole Anaphylaxis and Swelling   Ciprofloxacin Shortness Of Breath   Lamotrigine Rash and Other (See Comments)   Nitrofurantoin Hives   Other Anaphylaxis, Hives and Swelling    Walnuts and pine nuts   Peanut Oil Anaphylaxis, Hives, Swelling and Rash   Amoxicillin Hives and  Rash    Update - pt states she has been tested by her allergist and is no longer allergic to penicillin 03/2021   Doxycycline Hives and Rash   Latex Rash and Other (See Comments)   Meloxicam Itching and Other (See Comments)    Induces mania Interacts with Lithium   Naproxen Hives and Other (See Comments)    Interacts with lithium   Pramipexole Hives and Rash   Prednisone Other (See Comments)    Interacts with Lithium   Risperidone Hives and Rash   Sulfa Antibiotics  Rash    "that leaves scarring"   Tape Itching and Rash    Steri-StripsT   Cortisone Other (See Comments)    Exacerbates the mania of her bipolar   Keflex [Cephalexin]     Pt developed a rash after taking high dose keflex for several days    Percocet [Oxycodone-Acetaminophen] Other (See Comments)    Severe dizziness   Shingrix [Zoster Vac Recomb Adjuvanted] Swelling    rash   Vancomycin Itching    Itching on head and at IV insertion site where Vanc running   Cuvitru [Immune Globulin (Human)] Rash    Also allergic to Hizentra    Medications Reviewed Today     Reviewed by [name], RPH-CPP (Pharmacist) on 05/21/21 at 1148  Med List Status: <None>   Medication Order Taking? Sig Documenting Provider Last Dose Status Informant  acetaminophen (TYLENOL) 500 MG tablet [id number] Yes Take 1,000 mg by mouth every 6 (six) hours as needed for mild pain. [provider] Taking Active Self  albuterol (VENTOLIN HFA) 108 (90 Base) MCG/ACT inhaler [id number] Yes Inhale 1-2 puffs into the lungs every 6 (six) hours as needed for wheezing or shortness of breath. Copland, Gay Filler, MD Taking Active Self  aspirin-acetaminophen-caffeine Jearld Adjutant EXTRA STRENGTH) [id number] MG tablet [id number] Yes Take 2 tablets by mouth every 6 (six) hours as needed for headache. [provider] Taking Active Self  diphenhydrAMINE (BENADRYL) 25 MG tablet [id number] Yes Take 1 tablet (25 mg total) by mouth every 6 (six) hours as needed  for itching.  Patient taking differently: Take 25 mg by mouth every 6 (six) hours as needed for itching. Uses as premed prior to Ammie Ferrier, Barbette Hair, MD Taking Active   EPINEPHrine 0.3 mg/0.3 mL IJ SOAJ injection [id number] Yes Inject 0.3 mLs (0.3 mg total) into the muscle as needed for anaphylaxis. Copland, Gay Filler, MD Taking Active   ferrous sulfate 325 (65 FE) MG tablet [id number] Yes Take 325 mg by mouth daily with breakfast. Take vitamin C along with ferrous sulfated [provider] Taking Active   fexofenadine (ALLEGRA) 180 MG tablet [id number] Yes Take 180 mg by mouth daily. [provider] Taking Active   fluticasone furoate-vilanterol (BREO ELLIPTA) 100-25 MCG/INH AEPB [id number] Yes Inhale 1 puff into the lungs daily. Copland, Gay Filler, MD Taking Active Self  folic acid (FOLVITE) 1 MG tablet [id number] Yes Take 1 mg by mouth daily. [provider] Taking Active   hyoscyamine (LEVSIN) 0.125 MG tablet [id number] Yes Take 1 tablet (0.125 mg total) by mouth as needed for cramping. Reported on 09/24/2015 Copland, Gay Filler, MD Taking Active   Immune Globulin 10% (GAMUNEX-C) 10 GM/100ML SOLN [id number] Yes Inject into the vein. Every 3 weeks [provider] Taking Active   levothyroxine (SYNTHROID) 50 MCG tablet [id number] Yes Take 1 tablet (50 mcg total) by mouth daily before breakfast. Copland, Gay Filler, MD Taking Active   ondansetron (ZOFRAN) 8 MG tablet [id number] Yes Take by mouth every 8 (eight) hours as needed for nausea or vomiting. [provider] Taking Active   pantoprazole (PROTONIX) 40 MG tablet [id number] Yes Take 40 mg by mouth daily. [provider] Taking Active   rizatriptan (MAXALT-MLT) 10 MG disintegrating tablet [id number] Yes Take 1 tablet (10 mg total) by mouth as needed for migraine. May repeat in 2 hours if needed Copland, Gay Filler, MD Taking Active   senna-docusate (SENOKOT-S) 8.6-50 MG tablet [id number] Yes Take 2  tablets by mouth daily as needed  for mild constipation. [provider] Taking Active   triamcinolone cream (KENALOG) 0.1 % [id number] No Apply topically 2 (two) times daily.  Patient not taking: Reported on 05/21/2021   [provider] Not Taking Active   UNABLE TO FIND [id number] Yes Take 2.4 mg by mouth daily. Med Name: Lithium OTC [provider] Taking Active            Med Note Ebony Hail, AMBER B   Thu Feb 15, 2020  4:04 PM)    valACYclovir (VALTREX) 500 MG tablet [id number] No Take 500 mg by mouth 2 (two) times daily.  Patient not taking: Reported on 05/21/2021   [provider] Not Taking Active             Patient Active Problem List   Diagnosis Date Noted   Throat discomfort 03/12/2020   Hypothyroidism 11/18/2018   Fever 06/18/2018   Piriformis syndrome of right side 04/22/2017   Lateral epicondylitis of left elbow 04/06/2017   Solitary pulmonary nodule 10/29/2016   Chronic lymphocytic leukemia ([place]) 07/31/2016   Mass in neck 04/17/2016   Abdominal mass 04/17/2016   Asthma 12/12/2015   Multiple environmental allergies 12/12/2015   Chronic lower back pain 09/12/2015   Bipolar 1 disorder, depressed ([place]) 08/27/2015    Class: Chronic   Borderline personality disorder ([place]) 08/15/2015   Severe benzodiazepine use disorder ([place]) 08/15/2015   Alcohol use disorder, moderate, dependence ([place]) 08/15/2015   PTSD (post-traumatic stress disorder) 08/15/2015   History of bipolar disorder 08/15/2015   Pre-syncope 05/17/2015   Skull deformity 05/17/2015   Patellofemoral syndrome of both knees 05/02/2015    Immunization History  Administered Date(s) Administered   Influenza, Quadrivalent, Recombinant, Inj, Pf 04/28/2018   Influenza,inj,Quad PF,6+ Mos 03/20/2019   Influenza-Unspecified 04/15/2015, 05/19/2017   Moderna Sars-Covid-2 Vaccination 10/03/2019, 11/03/2019, 06/08/2020   Tdap 04/15/2015   Zoster Recombinat (Shingrix) 02/23/2019     Conditions to be addressed/monitored: Hypertriglyceridemia, Bipolar Disorder, and asthma; GERD / abdominal cramping; hypothyroidism; CLL  Care Plan : General Pharmacy (Adult)  Updates made by [name], RPH-CPP since 05/21/2021 12:00 AM     Problem: Chronic Disease Management support, education, and care coordination needs related to Asthma, Hypothyroidism, Bipolar Disorder, Leukemia, Stomach Cramping, Recurrent Shingles, Migraine, Allergy      Long-Range Goal: Patient-Specific Goal   Start Date: 11/08/2020  Recent Progress: On track  Priority: High  Note:   Current Barriers:  Chronic Disease Management support, education, and care coordination needs related to Asthma, Hypothyroidism, Bipolar Disorder, Leukemia, Stomach Cramping, Recurrent Shingles, Migraine, Allergy  Pharmacist Clinical Goal(s):  Over the next 90 days, patient will achieve adherence to monitoring guidelines and medication adherence to achieve therapeutic efficacy through collaboration with PharmD and provider.   Interventions: 1:1 collaboration with Copland, Gay Filler, MD regarding development and update of comprehensive plan of care as evidenced by provider attestation and co-signature Inter-disciplinary care team collaboration (see longitudinal plan of care) Comprehensive medication review performed; medication list updated in electronic medical record  Asthma: Controlled Current regimen:  Albuterol inhaler (ProAir) as needed Breo Ellipta 100-11mg 1 puff daily Patient reports using albuterol about 1 to 3 times per week  Continued episodes of awaking with SOB. Saw cardiologist and he has ordered sleep study. Patient has initial consult today for sleep study. .  Interventions:  Education provided regarding maintenance versus rescue inhaler and when to use.  Reviewed inhaler use / technique Reminded patient that BMemory Danceis maintenance inhaler - use every day; Albuterol is  rescue inhaler and is used when you  have shortness of breath or wheezing. Reminded patient to rinse mouth after using Breo to prevent infection / thrush.   Bipolar 1: Current regimen:  OTC lithium daily  Counseling Patient has restarted OTC lithium 30m daily [facility] and also has biweekly video / telephone sessions with counselor and chaplain with [facility]  Interventions: Encouraged continued follow up with counselors and spiritual care  Migraine Headaches Reports recent increase in headaches about 2 weeks ago but last week she did not need to use rizatriptan at all. Tried in past - imitrex Current regimen:  Rizatriptan 157mas needed Excedrin migraine as needed Interventions: Recommended maintain migraine medication regimen and follow up as planned with neurologist  Hypothyroidism Improved - TSH now at therapeutic goal and patient reports energy level improved with change in levothyroxine dose from 2575mto 68m14mn May 2022.  Patient requesting levothyroxine refill for 90 days. Current regimen:  Levothyroxine 68mc18mily Interventions: Maintain thyroid medication regimen  Coordinated refill for 90 days supply  Acid Reflux / GERD / abdominal pain:  Improved She did have 1 ER visit for abdominal pain about 6 months ago; CT of abdominal did not show any acute abnormalities  Current regimen:  Pantoprazole 40mg 5my Hyoscamine 0.125mg -28me as needed for stomach cramping Interventions: Reviewed interaction between Calquence and pantoprazole Ok to continue pantoprazole while Calquence is on hold but will need alternative for reflux if Calquence is restarted.  Continue current medications for GERD and abdominal pain  Medication management Current pharmacy: [facility] Interventions Comprehensive medication review performed. Updated medication list Reviewed refill history and adherence Continue current medication management  strategy  Health Maintenance  Patient is unable to take second dose of Shingrix due to arms swelling with first dose.    Patient Goals/Self-Care Activities Over the next 90 days, patient will:  take medications as prescribed target a minimum of 150 minutes of moderate intensity exercise weekly  Follow Up Plan: Telephone follow up appointment with care management team member scheduled for:  3 to 4 months          Medication Assistance: None required.  Patient affirms current coverage meets needs.  Patient's preferred pharmacy is:  [facility] [address] [id number] [phone] [id number] [address] [facility] POSh[place] HeightsASt[id number] [phone]aleTORE #[id number] [place] [phone] Up:  Patient agrees to Care Plan and Follow-up.  Plan: Telephone follow up appointment with care management team member scheduled for:  pharmacist in 3 to 4 months  Shirley Bolle ECherre RobinsD Clinical Pharmacist [place]ow[phone]

## 2021-05-21 NOTE — Patient Instructions (Signed)

## 2021-05-23 ENCOUNTER — Encounter: Payer: Self-pay | Admitting: General Practice

## 2021-05-23 NOTE — Progress Notes (Signed)
[facility] Spiritual Care Note  Mairlyn called with spiritual distress related to tackling a personal writing project. Provided empathic listening, emotional support, and strategies for coping with distress and focusing on the writing process in a time-limited way. She concluded the call much more centered, verbalized appreciation, and plans to be in touch again as needed.   [place], [place], [place] [facility] Pager [phone] Voicemail [phone]

## 2021-05-25 ENCOUNTER — Other Ambulatory Visit: Payer: Self-pay

## 2021-05-25 ENCOUNTER — Emergency Department (HOSPITAL_BASED_OUTPATIENT_CLINIC_OR_DEPARTMENT_OTHER)
Admission: EM | Admit: 2021-05-25 | Discharge: 2021-05-25 | Disposition: A | Discharge status: Home / Self Care | Payer: Medicare Other | Attending: Emergency Medicine | Admitting: Emergency Medicine

## 2021-05-25 ENCOUNTER — Encounter (HOSPITAL_BASED_OUTPATIENT_CLINIC_OR_DEPARTMENT_OTHER): Payer: Self-pay | Admitting: Emergency Medicine

## 2021-05-25 DIAGNOSIS — Z91010 Allergy to peanuts: Secondary | ICD-10-CM | POA: Insufficient documentation

## 2021-05-25 DIAGNOSIS — L03115 Cellulitis of right lower limb: Secondary | ICD-10-CM | POA: Diagnosis not present

## 2021-05-25 DIAGNOSIS — Z91040 Latex allergy status: Secondary | ICD-10-CM | POA: Insufficient documentation

## 2021-05-25 DIAGNOSIS — W57XXXA Bitten or stung by nonvenomous insect and other nonvenomous arthropods, initial encounter: Secondary | ICD-10-CM | POA: Diagnosis not present

## 2021-05-25 DIAGNOSIS — J029 Acute pharyngitis, unspecified: Secondary | ICD-10-CM | POA: Insufficient documentation

## 2021-05-25 DIAGNOSIS — Z87891 Personal history of nicotine dependence: Secondary | ICD-10-CM | POA: Insufficient documentation

## 2021-05-25 DIAGNOSIS — Z79899 Other long term (current) drug therapy: Secondary | ICD-10-CM | POA: Insufficient documentation

## 2021-05-25 DIAGNOSIS — S1096XA Insect bite of unspecified part of neck, initial encounter: Secondary | ICD-10-CM | POA: Insufficient documentation

## 2021-05-25 DIAGNOSIS — S70361A Insect bite (nonvenomous), right thigh, initial encounter: Secondary | ICD-10-CM | POA: Diagnosis not present

## 2021-05-25 DIAGNOSIS — E039 Hypothyroidism, unspecified: Secondary | ICD-10-CM | POA: Insufficient documentation

## 2021-05-25 DIAGNOSIS — J45909 Unspecified asthma, uncomplicated: Secondary | ICD-10-CM | POA: Diagnosis not present

## 2021-05-25 DIAGNOSIS — Z7951 Long term (current) use of inhaled steroids: Secondary | ICD-10-CM | POA: Diagnosis not present

## 2021-05-25 MED ORDER — CLINDAMYCIN HCL 150 MG PO CAPS: 150.0000 mg | ORAL_CAPSULE | Freq: Three times a day (TID) | ORAL | 0 refills | Status: DC

## 2021-05-25 NOTE — Discharge Instructions (Addendum)
You are seen in the emergency department today for an insect bite and skin infection.  As we went through her options I think clindamycin is the best antibiotic for you.  And I found good record that you have taken this before without reaction.  I have sent this prescription over to the pharmacy that we talked about, and it will be ready for you to pick up.  I also like you to continue taking the Benadryl for the next several days.  I think that a Tylenol or ibuprofen would also help with the swelling and some of the pain associated.  As we discussed I like you to make your primary doctor aware of the reaction that you had with the steroid.  I understand that she may want to take it for your other skin conditions, but I do feel more comfortable if your PCP knew of the effect that it had on you before.  Otherwise continue to monitor how you are doing, and return to the emergency department for new or worsening symptoms such as fever, or spreading of the redness despite taking the oral antibiotic.  At that time we may want to consider IV antibiotics.  It was a pleasure seeing caring for you today, and I hope you start to feel better soon!

## 2021-05-25 NOTE — ED Triage Notes (Signed)
Pt arrives pov with RLE swelling and itching. Redness, heat and tenderness noted to right calf. Pt endorses insect bite x 4 days pta. Red circular marks with drainage also noted to left side of neck. Pt endorses fever, chills. Reports 25mg  benadryl at 0900 today. Pt also c/o wheezing. P table to speak in complete sentences

## 2021-05-25 NOTE — ED Provider Notes (Signed)
[facility] Provider Note   CSN: [id number] Arrival date & time: 05/25/21  1650     History Chief Complaint  Patient presents with   Allergic Reaction    Morgan [name] is a 55 y.o. female with history of chronic lymphocytic leukemia who presents to the emergency department for insect bites to her right thigh and left lateral neck.  She states she initially got the bites about 4 days ago.  They have had increased redness, heat, and tenderness.  She also states that she felt feverish at home.  She has been taking Benadryl with some relief.  She overall believes that the bites on her neck look improved compared to prior, but the bite on her right lower leg the redness is spreading.  She is able to speak in complete sentences, minimal sore throat, tolerating her own secretions.  Had 1 episode of slight wheezing, but this improved on its own.   Allergic Reaction Presenting symptoms: wheezing       Past Medical History:  Diagnosis Date   Anxiety    Arthritis    Asthma    Bipolar disorder ([place])    Bulging lumbar disc 07/19/05   Chronic headaches    Chronic lymphocytic leukemia ([place]) 07/31/2016   Colon polyps    Degenerative disorder of bone    Depression    History of borderline personality disorder    Hypothyroidism 2007   Developed after use of Lithium   IBS (irritable bowel syndrome) 1995   Pneumonia    Proctitis    Shingles 07/2018   Substance abuse ([place])    ativan last month    Patient Active Problem List   Diagnosis Date Noted   Throat discomfort 03/12/2020   Hypothyroidism 11/18/2018   Fever 06/18/2018   Piriformis syndrome of right side 04/22/2017   Lateral epicondylitis of left elbow 04/06/2017   Solitary pulmonary nodule 10/29/2016   Chronic lymphocytic leukemia ([place]) 07/31/2016   Mass in neck 04/17/2016   Abdominal mass 04/17/2016   Asthma 12/12/2015   Multiple environmental allergies 12/12/2015   Chronic lower back pain  09/12/2015   Bipolar 1 disorder, depressed ([place]) 08/27/2015    Class: Chronic   Borderline personality disorder ([place]) 08/15/2015   Severe benzodiazepine use disorder ([place]) 08/15/2015   Alcohol use disorder, moderate, dependence ([place]) 08/15/2015   PTSD (post-traumatic stress disorder) 08/15/2015   History of bipolar disorder 08/15/2015   Pre-syncope 05/17/2015   Skull deformity 05/17/2015   Patellofemoral syndrome of both knees 05/02/2015    Past Surgical History:  Procedure Laterality Date   ABDOMINAL HYSTERECTOMY     APPENDECTOMY     BUNIONECTOMY Right 06/2012   CHOLECYSTECTOMY     OOPHORECTOMY     SHOULDER OPEN ROTATOR CUFF REPAIR Right 03/2010   TUBAL LIGATION       OB History     Gravida  2   Para  2   Term  2   Preterm  0   AB  0   Living  2      SAB  0   IAB  0   Ectopic  0   Multiple  0   Live Births              Family History  Problem Relation Age of Onset   Depression Mother    Hypertension Mother    Thyroid disease Father    Alzheimer's disease Father    Post-traumatic stress disorder Sister  Alcohol abuse Brother    Drug abuse Brother    Post-traumatic stress disorder Brother    COPD Maternal Grandmother    Heart disease Maternal Grandfather    Arthritis Paternal Grandmother    Diabetes Paternal Grandmother    Depression Paternal Grandmother    Alzheimer's disease Paternal Grandmother    Heart disease Paternal Grandfather    Coronary artery disease Paternal Grandfather    Alcohol abuse Paternal Grandfather    Sleep apnea Cousin    Diabetes Maternal Uncle    Colon cancer Neg Hx    Breast cancer Neg Hx     Social History   Tobacco Use   Smoking status: Former    Types: Cigarettes    Quit date: 07/20/1980    Years since quitting: 40.8   Smokeless tobacco: Never   Tobacco comments:    Also exposed to second hand smoke as a child  Vaping Use   Vaping Use: Never used  Substance Use Topics   Alcohol use: Yes     Alcohol/week: 0.0 standard drinks    Comment: occ   Drug use: Not Currently    Home Medications Prior to Admission medications   Medication Sig Start Date End Date Taking? Authorizing Provider  clindamycin (CLEOCIN) 150 MG capsule Take 1 capsule (150 mg total) by mouth 3 (three) times daily for 7 days. 05/25/21 06/01/21 Yes Taimur Fier T, PA-C  acetaminophen (TYLENOL) 500 MG tablet Take 1,000 mg by mouth every 6 (six) hours as needed for mild pain.    [provider]  albuterol (VENTOLIN HFA) 108 (90 Base) MCG/ACT inhaler Inhale 1-2 puffs into the lungs every 6 (six) hours as needed for wheezing or shortness of breath. 01/04/20   Copland, Gay Filler, MD  aspirin-acetaminophen-caffeine (EXCEDRIN EXTRA STRENGTH) [phone] MG tablet Take 2 tablets by mouth every 6 (six) hours as needed for headache.    [provider]  diphenhydrAMINE (BENADRYL) 25 MG tablet Take 1 tablet (25 mg total) by mouth every 6 (six) hours as needed for itching. Patient taking differently: Take 25 mg by mouth every 6 (six) hours as needed for itching. Uses as premed prior to [facility] 02/27/19   Horton, Barbette Hair, MD  EPINEPHrine 0.3 mg/0.3 mL IJ SOAJ injection Inject 0.3 mLs (0.3 mg total) into the muscle as needed for anaphylaxis. 01/04/20   Copland, Gay Filler, MD  ferrous sulfate 325 (65 FE) MG tablet Take 325 mg by mouth daily with breakfast. Take vitamin C along with ferrous sulfated Patient not taking: Reported on 05/21/2021    [provider]  fexofenadine (ALLEGRA) 180 MG tablet Take 180 mg by mouth daily.    [provider]  fluticasone furoate-vilanterol (BREO ELLIPTA) 100-25 MCG/INH AEPB Inhale 1 puff into the lungs daily. 01/04/20   Copland, Gay Filler, MD  folic acid (FOLVITE) 1 MG tablet Take 1 mg by mouth daily. 08/22/20   [provider]  hyoscyamine (LEVSIN) 0.125 MG tablet Take 1 tablet (0.125 mg total) by mouth as needed for cramping. Reported on 09/24/2015 02/17/21    Copland, Gay Filler, MD  Immune Globulin 10% (GAMUNEX-C) 10 GM/100ML SOLN Inject into the vein. Every 3 weeks    [provider]  levothyroxine (SYNTHROID) 50 MCG tablet Take 1 tablet (50 mcg total) by mouth daily before breakfast. 05/21/21   Copland, Gay Filler, MD  ondansetron (ZOFRAN) 8 MG tablet Take by mouth every 8 (eight) hours as needed for nausea or vomiting.    [provider]  pantoprazole (PROTONIX) 40 MG tablet Take 40 mg by mouth daily.    [provider]  rizatriptan (MAXALT-MLT) 10 MG disintegrating tablet Take 1 tablet (10 mg total) by mouth as needed for migraine. May repeat in 2 hours if needed 08/08/20   Copland, Gay Filler, MD  senna-docusate (SENOKOT-S) 8.6-50 MG tablet Take 2 tablets by mouth daily as needed for mild constipation.    [provider]  triamcinolone cream (KENALOG) 0.1 % Apply topically 2 (two) times daily.    [provider]  UNABLE TO FIND Take 2.4 mg by mouth 2 (two) times daily. Med Name: Lithium OTC    [provider]  valACYclovir (VALTREX) 500 MG tablet Take 500 mg by mouth 2 (two) times daily. Patient not taking: Reported on 05/21/2021 08/12/20   [provider]    Allergies    Aripiprazole, Ciprofloxacin, Lamotrigine, Nitrofurantoin, Other, Peanut oil, Amoxicillin, Doxycycline, Latex, Meloxicam, Naproxen, Pramipexole, Prednisone, Risperidone, Sulfa antibiotics, Tape, Cortisone, Keflex [cephalexin], Percocet [oxycodone-acetaminophen], Shingrix [zoster vac recomb adjuvanted], Vancomycin, and Cuvitru [immune globulin (human)]  Review of Systems   Review of Systems  Constitutional:  Positive for chills.  HENT:  Positive for sore throat.   Respiratory:  Positive for wheezing. Negative for shortness of breath and stridor.   Cardiovascular:  Negative for chest pain.  Gastrointestinal:  Negative for abdominal pain, constipation, diarrhea, nausea and vomiting.  Musculoskeletal:  Negative for  arthralgias.  Skin:        Insect bite with redness and tenderness  All other systems reviewed and are negative.  Physical Exam Updated Vital Signs BP 119/69   Pulse 80   Temp 98.6 F (37 C) (Oral)   Resp 16   Ht 5\' 3"  (1.6 m)   Wt 87.5 kg   SpO2 99%   BMI 34.19 kg/m   Physical Exam Vitals and nursing note reviewed.  Constitutional:      Appearance: Normal appearance.  HENT:     Head: Normocephalic and atraumatic.  Eyes:     Conjunctiva/sclera: Conjunctivae normal.  Cardiovascular:     Rate and Rhythm: Normal rate and regular rhythm.  Pulmonary:     Effort: Pulmonary effort is normal. No respiratory distress.     Breath sounds: Normal breath sounds.  Abdominal:     General: There is no distension.     Palpations: Abdomen is soft.     Tenderness: There is no abdominal tenderness.  Skin:    General: Skin is warm and dry.     Comments: 1 insect bite on the right medial thigh with overlying erythema extending approximately 6 cm with no clear demarcation.  3 insect bites over the left lateral neck with overlying erythema and blister, no evidence of streaking.  Neurological:     General: No focal deficit present.     Mental Status: She is alert.    ED Results / Procedures / Treatments   Labs (all labs ordered are listed, but only abnormal results are displayed) Labs Reviewed - No data to display  EKG None  Radiology No results found.  Procedures Procedures   Medications Ordered in ED Medications - No data to display  ED Course  I have reviewed the triage vital signs and the nursing notes.  Pertinent labs & imaging results that were available during my care of the patient were reviewed by me and considered in my medical decision making (see chart for details).    MDM Rules/Calculators/A&P  Patient is 55 year old female with history of chronic lymphocytic leukemia who presents to the emergency department for an insect bite with  increased redness for about 4 days.  She also notes fever at home, 1 episode of wheezing, and intermittent sore throat.  Patient has been taking Benadryl with mild relief.  On my exam patient is afebrile, not tachycardic, and in no acute distress.  She has 4 insect bites in total, one over her right medial thigh with significant overlying erythema and no clear demarcation, as well as 3 insect bites of her left lateral neck with minimal erythema and edema.  Lung sounds are clear in all fields, no stridor or wheezing.   Patient has been afebrile, in no respiratory distress, no nausea, vomiting, abdominal pain, to suggest anaphylactic reaction.  Patient appears to have infected insect bites.  As she has several antibiotic allergies, will treat with the third line option of clindamycin, which she has had in the past and seem to work okay for her.  I would consider steroids for a systemic allergic reaction, but patient has had significantly negative experiences with steroids in the past with her associated history of bipolar disorder.  At this time we will stick with Benadryl for itching and inflammation.   Overall patient is clinically well-appearing, and is not requiring admission or inpatient treatment for her symptoms at this time.  She stable to discharge to home, and given strict return precautions.  Patient agreeable to plan.  Final Clinical Impression(s) / ED Diagnoses Final diagnoses:  Insect bite of right thigh, initial encounter  Cellulitis of right lower extremity    Rx / DC Orders ED Discharge Orders          Ordered    clindamycin (CLEOCIN) 150 MG capsule  3 times daily        05/25/21 1925             Jabez Molner T, PA-C 05/25/21 2000    Fredia Sorrow, MD 06/06/21 [phone]

## 2021-06-03 DIAGNOSIS — J45909 Unspecified asthma, uncomplicated: Secondary | ICD-10-CM | POA: Diagnosis not present

## 2021-06-03 DIAGNOSIS — R0789 Other chest pain: Secondary | ICD-10-CM | POA: Diagnosis not present

## 2021-06-03 DIAGNOSIS — R3129 Other microscopic hematuria: Secondary | ICD-10-CM | POA: Diagnosis not present

## 2021-06-03 DIAGNOSIS — K589 Irritable bowel syndrome without diarrhea: Secondary | ICD-10-CM | POA: Diagnosis not present

## 2021-06-03 DIAGNOSIS — Z9049 Acquired absence of other specified parts of digestive tract: Secondary | ICD-10-CM | POA: Diagnosis not present

## 2021-06-03 DIAGNOSIS — Z23 Encounter for immunization: Secondary | ICD-10-CM | POA: Diagnosis not present

## 2021-06-03 DIAGNOSIS — R590 Localized enlarged lymph nodes: Secondary | ICD-10-CM | POA: Diagnosis not present

## 2021-06-03 DIAGNOSIS — E611 Iron deficiency: Secondary | ICD-10-CM | POA: Diagnosis not present

## 2021-06-03 DIAGNOSIS — R42 Dizziness and giddiness: Secondary | ICD-10-CM | POA: Diagnosis not present

## 2021-06-03 DIAGNOSIS — E039 Hypothyroidism, unspecified: Secondary | ICD-10-CM | POA: Diagnosis not present

## 2021-06-03 DIAGNOSIS — Z87891 Personal history of nicotine dependence: Secondary | ICD-10-CM | POA: Diagnosis not present

## 2021-06-03 DIAGNOSIS — R110 Nausea: Secondary | ICD-10-CM | POA: Diagnosis not present

## 2021-06-03 DIAGNOSIS — C9110 Chronic lymphocytic leukemia of B-cell type not having achieved remission: Secondary | ICD-10-CM | POA: Diagnosis not present

## 2021-06-03 DIAGNOSIS — R002 Palpitations: Secondary | ICD-10-CM | POA: Diagnosis not present

## 2021-06-04 DIAGNOSIS — D801 Nonfamilial hypogammaglobulinemia: Secondary | ICD-10-CM | POA: Diagnosis not present

## 2021-06-06 ENCOUNTER — Encounter: Payer: Self-pay | Admitting: General Practice

## 2021-06-06 NOTE — Progress Notes (Signed)
[place] Spiritual Care Note  Follow Morgan [name] through [facility]. Met for one hour via Webex for Spiritual Care Virtual Visit, providing opportunity for Morgan [name] to share and process recent medical updates. She was overall in good spirits and relieved to see the congruence between scans/labs and how she is feeling. Morgan [name] reaches out regularly as needed.   [place], [place], [facility] Pager [phone] Voicemail [phone]

## 2021-06-16 ENCOUNTER — Institutional Professional Consult (permissible substitution): Payer: Medicare Other | Admitting: Neurology

## 2021-06-18 DIAGNOSIS — E039 Hypothyroidism, unspecified: Secondary | ICD-10-CM

## 2021-06-18 DIAGNOSIS — J45909 Unspecified asthma, uncomplicated: Secondary | ICD-10-CM | POA: Diagnosis not present

## 2021-06-25 DIAGNOSIS — D801 Nonfamilial hypogammaglobulinemia: Secondary | ICD-10-CM | POA: Diagnosis not present

## 2021-06-27 ENCOUNTER — Encounter: Payer: Self-pay | Admitting: General Practice

## 2021-06-27 NOTE — Progress Notes (Signed)
[place] Note  Morgan [name] is working with two mental health providers at [facility] and called me as part of her safety plan for self-care this weekend due to suicidal ideation. She is also reaching out to her son and daughter, close friend Magda Paganini, support group friend Michelene Heady, and plans to connect with another friend from church, asking all of them to check on her. She plans to suspend work and instead focus on enjoyment and healthy escapism, such as watching movies. She is currently running errands and has picked up dinner. Penne has the mobile crisis unit number in her phone and plans to present to the ED if her feelings and risk worsen. We plan to speak by phone Monday morning and have a regularly scheduled Spiritual Care visit on Monday afternoon.   [place], [place], [facility] Pager [phone] Voicemail [phone]

## 2021-06-30 ENCOUNTER — Encounter: Payer: Self-pay | Admitting: General Practice

## 2021-06-30 NOTE — Progress Notes (Signed)
[facility] Spiritual Care Note  Had Spiritual Care appointment in my office as scheduled. Morgan [name] proclaimed, "I am an epitome of perseverance!" regarding all of the self-care work she did over the weekend to manage her low mood and suicidal thoughts, which have now abated. She meets with her [facility] therapist on Wednesday for support and debriefing.   [place], [place], [facility] Pager [phone] Voicemail [phone]

## 2021-07-02 ENCOUNTER — Other Ambulatory Visit: Payer: Self-pay

## 2021-07-02 ENCOUNTER — Ambulatory Visit (INDEPENDENT_AMBULATORY_CARE_PROVIDER_SITE_OTHER): Payer: Medicare Other | Admitting: Neurology

## 2021-07-02 DIAGNOSIS — R0683 Snoring: Secondary | ICD-10-CM

## 2021-07-02 DIAGNOSIS — E669 Obesity, unspecified: Secondary | ICD-10-CM

## 2021-07-02 DIAGNOSIS — R351 Nocturia: Secondary | ICD-10-CM

## 2021-07-02 DIAGNOSIS — R0689 Other abnormalities of breathing: Secondary | ICD-10-CM

## 2021-07-02 DIAGNOSIS — Z820 Family history of epilepsy and other diseases of the nervous system: Secondary | ICD-10-CM

## 2021-07-02 DIAGNOSIS — F39 Unspecified mood [affective] disorder: Secondary | ICD-10-CM

## 2021-07-02 DIAGNOSIS — R519 Headache, unspecified: Secondary | ICD-10-CM

## 2021-07-02 DIAGNOSIS — G4719 Other hypersomnia: Secondary | ICD-10-CM

## 2021-07-02 DIAGNOSIS — R002 Palpitations: Secondary | ICD-10-CM

## 2021-07-02 DIAGNOSIS — C9110 Chronic lymphocytic leukemia of B-cell type not having achieved remission: Secondary | ICD-10-CM

## 2021-07-02 DIAGNOSIS — G4720 Circadian rhythm sleep disorder, unspecified type: Secondary | ICD-10-CM

## 2021-07-04 ENCOUNTER — Encounter: Payer: Self-pay | Admitting: General Practice

## 2021-07-04 NOTE — Progress Notes (Signed)
[place] Spiritual Care Note  Follow [place] through [facility]. She called for assistance with processing and discernment related to coping with fatigue. Provided empathic listening, reflective questions, and emotional support. She plans to phone again as needed.   [place], [place], [facility] Pager [phone] Voicemail [phone]

## 2021-07-04 NOTE — Procedures (Signed)
PATIENT'S NAME:  Morgan [name], [name] DOB:      Sep 30, 1965      MR#:    [id number]     DATE OF RECORDING: 07/02/2021 REFERRING M.D.:  Lamar Blinks, MD Study Performed:   Baseline Polysomnogram HISTORY: 55 year old woman with a history of chronic headaches, hypothyroidism, irritable bowel syndrome, asthma, arthritis, anxiety, depression, CLL, and mild obesity, who reports snoring and excessive daytime somnolence. The patient endorsed the Epworth Sleepiness Scale at 15 points. The patient's weight 197 pounds with a height of 64 (inches), resulting in a BMI of 34.3 kg/m2. The patient's neck circumference measured 15 inches.  CURRENT MEDICATIONS: Tylenol, Ventolin, Excedrin extra strength, Beadryl, Epiniphrine, Allegra, Breo Ellipta, Folvite, Levsin, Gamunex-C, Synthroid, Zofran, Protonix, Maxalt-MLT, Senokot-S, Kenalog, ferrous sulfate, Valtrex   PROCEDURE:  This is a multichannel digital polysomnogram utilizing the Somnostar 11.2 system.  Electrodes and sensors were applied and monitored per AASM Specifications.   EEG, EOG, Chin and Limb EMG, were sampled at 200 Hz.  ECG, Snore and Nasal Pressure, Thermal Airflow, Respiratory Effort, CPAP Flow and Pressure, Oximetry was sampled at 50 Hz. Digital video and audio were recorded.      BASELINE STUDY  Lights Out was at 21:50 and Lights On at 04:54.  Total recording time (TRT) was 425 minutes, with a total sleep time (TST) of 313 minutes.   The patient's sleep latency was 14.5 minutes.  REM latency was 101 minutes.  The sleep efficiency was 73.6 %.     SLEEP ARCHITECTURE: WASO (Wake after sleep onset) was 96.5 minutes with one longer period of wakefulness and otherwise mild sleep fragmentation noted.  There were 14 minutes in Stage N1, 193 minutes Stage N2, 67.5 minutes Stage N3 and 38.5 minutes in Stage REM.  The percentage of Stage N1 was 4.5%, Stage N2 was 61.7%, which is increased, Stage N3 was 21.6% and Stage R (REM sleep) was 12.3%, which is reduced. The  arousals were noted as: 41 were spontaneous, 0 were associated with PLMs, 1 were associated with respiratory events.  RESPIRATORY ANALYSIS:  There were a total of 4 respiratory events:  1 obstructive apneas, 0 central apneas and 0 mixed apneas with a total of 1 apneas and an apnea index (AI) of .2 /hour. There were 3 hypopneas with a hypopnea index of .6 /hour. The patient also had 0 respiratory event related arousals (RERAs).      The total APNEA/HYPOPNEA INDEX (AHI) was 0.8/hour and the total RESPIRATORY DISTURBANCE INDEX was  0.8/hour.  0 events occurred in REM sleep and 6 events in NREM. The REM AHI was  0 /hour, versus a non-REM AHI of .9. The patient spent 141.5 minutes of total sleep time in the supine position and 172 minutes in non-supine.. The supine AHI was 1.7 versus a non-supine AHI of 0.0.  OXYGEN SATURATION & C02:  The Wake baseline 02 saturation was 98%, with the lowest being 91%. Time spent below 89% saturation equaled 0 minutes.   PERIODIC LIMB MOVEMENTS: The patient had a total of 0 Periodic Limb Movements.  The Periodic Limb Movement (PLM) index was 0 and the PLM Arousal index was 0/hour.  Audio and video analysis did not show any abnormal or unusual movements, behaviors, phonations or vocalizations. The patient took 1 bathroom break. Intermittent mild to moderate snoring was noted. The EKG was in keeping with normal sinus rhythm (NSR); she has loop recorder in situ.  Post-study, the patient indicated that sleep was worse than usual.   IMPRESSION:  Primary Snoring Dysfunctions associated with sleep stages or arousal from sleep  RECOMMENDATIONS:  This study does not demonstrate any significant obstructive or central sleep disordered breathing with the exception of mild to moderate snoring. This study does not support an intrinsic sleep disorder as a cause of the patient's symptoms. Other causes, including circadian rhythm disturbances, an underlying mood disorder, medication  effect and/or an underlying medical problem cannot be ruled out. Weight loss may aid in reducing her snoring.  This study shows sleep fragmentation and abnormal sleep stage percentages; these are nonspecific findings and per se do not signify an intrinsic sleep disorder or a cause for the patient's sleep-related symptoms. Causes include (but are not limited to) the first night effect of the sleep study, circadian rhythm disturbances, medication effect or an underlying mood disorder or medical problem.  The patient should be cautioned not to drive, work at heights, or operate dangerous or heavy equipment when tired or sleepy. Review and reiteration of good sleep hygiene measures should be pursued with any patient.  The patient will be advised to follow up with the referring provider, who will be notified of the test results.  I certify that I have reviewed the entire raw data recording prior to the issuance of this report in accordance with the Standards of Accreditation of the American Academy of Sleep Medicine (AASM)   Star Age, MD, PhD Diplomat, American Board of Neurology and Sleep Medicine (Neurology and Sleep Medicine)

## 2021-07-07 ENCOUNTER — Telehealth: Payer: Self-pay | Admitting: *Deleted

## 2021-07-07 NOTE — Telephone Encounter (Signed)
Called pt with sleep study results    sleep study did not show any significant obstructive sleep apnea with the exception of mild to moderate snoring. Weight loss may aid in reducing her snoring. She can FU with Dr. Leta Baptist and the NP at [facility] as scheduled and with her PCP at this point  Pt verbalized understating and didn't have any questions or concerns

## 2021-07-16 DIAGNOSIS — D801 Nonfamilial hypogammaglobulinemia: Secondary | ICD-10-CM | POA: Diagnosis not present

## 2021-07-17 DIAGNOSIS — Z13228 Encounter for screening for other metabolic disorders: Secondary | ICD-10-CM | POA: Diagnosis not present

## 2021-07-17 DIAGNOSIS — K219 Gastro-esophageal reflux disease without esophagitis: Secondary | ICD-10-CM | POA: Diagnosis not present

## 2021-07-17 DIAGNOSIS — E038 Other specified hypothyroidism: Secondary | ICD-10-CM | POA: Diagnosis not present

## 2021-07-17 DIAGNOSIS — M1990 Unspecified osteoarthritis, unspecified site: Secondary | ICD-10-CM | POA: Diagnosis not present

## 2021-07-17 DIAGNOSIS — Z9109 Other allergy status, other than to drugs and biological substances: Secondary | ICD-10-CM | POA: Diagnosis not present

## 2021-07-17 DIAGNOSIS — K588 Other irritable bowel syndrome: Secondary | ICD-10-CM | POA: Diagnosis not present

## 2021-07-23 DIAGNOSIS — Z9049 Acquired absence of other specified parts of digestive tract: Secondary | ICD-10-CM | POA: Diagnosis not present

## 2021-07-23 DIAGNOSIS — Z79899 Other long term (current) drug therapy: Secondary | ICD-10-CM | POA: Diagnosis not present

## 2021-07-23 DIAGNOSIS — D84821 Immunodeficiency due to drugs: Secondary | ICD-10-CM | POA: Diagnosis not present

## 2021-07-23 DIAGNOSIS — T7840XA Allergy, unspecified, initial encounter: Secondary | ICD-10-CM | POA: Diagnosis not present

## 2021-07-23 DIAGNOSIS — Z95818 Presence of other cardiac implants and grafts: Secondary | ICD-10-CM | POA: Diagnosis not present

## 2021-07-23 DIAGNOSIS — Z856 Personal history of leukemia: Secondary | ICD-10-CM | POA: Diagnosis not present

## 2021-07-23 DIAGNOSIS — L299 Pruritus, unspecified: Secondary | ICD-10-CM | POA: Diagnosis not present

## 2021-07-23 DIAGNOSIS — Z87891 Personal history of nicotine dependence: Secondary | ICD-10-CM | POA: Diagnosis not present

## 2021-07-23 DIAGNOSIS — D839 Common variable immunodeficiency, unspecified: Secondary | ICD-10-CM | POA: Diagnosis not present

## 2021-07-23 DIAGNOSIS — D801 Nonfamilial hypogammaglobulinemia: Secondary | ICD-10-CM | POA: Diagnosis not present

## 2021-07-24 ENCOUNTER — Encounter: Payer: Self-pay | Admitting: General Practice

## 2021-07-24 NOTE — Progress Notes (Signed)
[place] Note  Met with Morgan [name] in my office at her request to process the intersection of healthcare and discernment about how best to meet future needs (housing, work, Social research officer, government), which are significant stressors right now. She is using her tools (reflection, writing, prayer, walking) to take her discernment step by step and to seek divine guidance and encouragement along the way and continues to address mood (low and discouraged) and other concerns with her counselor and a key support person on her [facility] team.  Provided empathic listening, pastoral reflection, and spiritual/emotional support.  We scheduled a brief phone check-in for 1pm on Friday 1/13 to give her structure to look forward to.   [place], [place], [facility] Pager [phone] Voicemail [phone]

## 2021-07-29 DIAGNOSIS — T7840XA Allergy, unspecified, initial encounter: Secondary | ICD-10-CM | POA: Diagnosis not present

## 2021-07-29 NOTE — Progress Notes (Signed)
[place] at [facility] [address] [place], [place] [address] [phone]  Date:  07/31/2021   Name:  Morgan [name]   DOB:  May 12, 1966   MRN:  [id number]  PCP:  Darreld Mclean, MD    Chief Complaint: Back Pain (Pain around the L hip. She hopes this is nothing intestinal. 2 episodes between now and November. /Concerns/ questions: dry cracked feet/)   History of Present Illness:  Morgan [name] is a 56 y.o. very pleasant female patient who presents with the following:  Patient seen today with concern of hip pain Most recent visit with myself in October for physical- History of CLL, hypothyroidism, mood disorder She is also recently struggled with various rashes, itching  Today Morgan [name] notes that she is having intermittent hip pain - it seems like maybe the SI joint is the issue The pain will seem to come and go dramatically  She first noticed it in [facility] November it seemed to wrap into her left lateral hip, she is concerned this might have been diverticulitis. It has occurred twice and tends to last about 4 days Right now her back is hurting in more for typical fashion, no side pain  She worked out this am- did weight training and walking at the gym Right now she is using ibuprofen and cold therapy for her back  She does not prefer to use muscle relaxer  She is using IVIG every 3 weeks  Her skin and rashes   She is also concerned about thick calluses, "cracking and bleeding" on feet  Patient Active Problem List   Diagnosis Date Noted   Throat discomfort 03/12/2020   Hypothyroidism 11/18/2018   Fever 06/18/2018   Piriformis syndrome of right side 04/22/2017   Lateral epicondylitis of left elbow 04/06/2017   Solitary pulmonary nodule 10/29/2016   Chronic lymphocytic leukemia ([place]) 07/31/2016   Mass in neck 04/17/2016   Abdominal mass 04/17/2016   Asthma 12/12/2015   Multiple environmental allergies 12/12/2015    Chronic lower back pain 09/12/2015   Bipolar 1 disorder, depressed ([place]) 08/27/2015    Class: Chronic   Borderline personality disorder ([place]) 08/15/2015   Severe benzodiazepine use disorder ([place]) 08/15/2015   Alcohol use disorder, moderate, dependence ([place]) 08/15/2015   PTSD (post-traumatic stress disorder) 08/15/2015   History of bipolar disorder 08/15/2015   Pre-syncope 05/17/2015   Skull deformity 05/17/2015   Patellofemoral syndrome of both knees 05/02/2015    Past Medical History:  Diagnosis Date   Anxiety    Arthritis    Asthma    Bipolar disorder ([place])    Bulging lumbar disc 07/19/05   Chronic headaches    Chronic lymphocytic leukemia ([place]) 07/31/2016   Colon polyps    Degenerative disorder of bone    Depression    History of borderline personality disorder    Hypothyroidism 2007   Developed after use of Lithium   IBS (irritable bowel syndrome) 1995   Pneumonia    Proctitis    Shingles 07/2018   Substance abuse ([place])    ativan last month    Past Surgical History:  Procedure Laterality Date   ABDOMINAL HYSTERECTOMY     APPENDECTOMY     BUNIONECTOMY Right 06/2012   CHOLECYSTECTOMY     OOPHORECTOMY     SHOULDER OPEN ROTATOR CUFF REPAIR Right 03/2010   TUBAL LIGATION      Social History   Tobacco Use   Smoking status: Former  Types: Cigarettes    Quit date: 07/20/1980    Years since quitting: 41.0   Smokeless tobacco: Never   Tobacco comments:    Also exposed to second hand smoke as a child  Vaping Use   Vaping Use: Never used  Substance Use Topics   Alcohol use: Yes    Alcohol/week: 0.0 standard drinks    Comment: occ   Drug use: Not Currently    Family History  Problem Relation Age of Onset   Depression Mother    Hypertension Mother    Thyroid disease Father    Alzheimer's disease Father    Post-traumatic stress disorder Sister    Alcohol abuse Brother    Drug abuse Brother    Post-traumatic stress disorder Brother    COPD Maternal  Grandmother    Heart disease Maternal Grandfather    Arthritis Paternal Grandmother    Diabetes Paternal Grandmother    Depression Paternal Grandmother    Alzheimer's disease Paternal Grandmother    Heart disease Paternal Grandfather    Coronary artery disease Paternal Grandfather    Alcohol abuse Paternal Grandfather    Sleep apnea Cousin    Diabetes Maternal Uncle    Colon cancer Neg Hx    Breast cancer Neg Hx     Allergies  Allergen Reactions   Aripiprazole Anaphylaxis and Swelling   Ciprofloxacin Shortness Of Breath   Lamotrigine Rash and Other (See Comments)   Nitrofurantoin Hives   Other Anaphylaxis, Hives and Swelling    Walnuts and pine nuts   Peanut Oil Anaphylaxis, Hives, Swelling and Rash   Amoxicillin Hives and Rash    Update - pt states she has been tested by her allergist and is no longer allergic to penicillin 03/2021   Doxycycline Hives and Rash   Latex Rash and Other (See Comments)   Meloxicam Itching and Other (See Comments)    Induces mania Interacts with Lithium   Naproxen Hives and Other (See Comments)    Interacts with lithium   Pramipexole Hives and Rash   Prednisone Other (See Comments)    Interacts with Lithium   Risperidone Hives and Rash   Sulfa Antibiotics Rash    "that leaves scarring"   Tape Itching and Rash    Steri-Strips   Cortisone Other (See Comments)    Exacerbates the mania of her bipolar   Keflex [Cephalexin]     Pt developed a rash after taking high dose keflex for several days    Percocet [Oxycodone-Acetaminophen] Other (See Comments)    Severe dizziness   Shingrix [Zoster Vac Recomb Adjuvanted] Swelling    rash   Vancomycin Itching    Itching on head and at IV insertion site where Vanc running   Cuvitru [Immune Globulin (Human)] Rash    Also allergic to Hizentra    Medication list has been reviewed and updated.  Current Outpatient Medications on File Prior to Visit  Medication Sig Dispense Refill   acetaminophen  (TYLENOL) 500 MG tablet Take 1,000 mg by mouth every 6 (six) hours as needed for mild pain.     albuterol (VENTOLIN HFA) 108 (90 Base) MCG/ACT inhaler Inhale 1-2 puffs into the lungs every 6 (six) hours as needed for wheezing or shortness of breath. 18 g 6   aspirin-acetaminophen-caffeine (EXCEDRIN EXTRA STRENGTH) 250-250-65 MG tablet Take 2 tablets by mouth every 6 (six) hours as needed for headache.     diphenhydrAMINE (BENADRYL) 25 MG tablet Take 1 tablet (25 mg total) by mouth every  6 (six) hours as needed for itching. (Patient taking differently: Take 25 mg by mouth every 6 (six) hours as needed for itching. Uses as premed prior to Hizentra) 20 tablet 0   EPINEPHrine 0.3 mg/0.3 mL IJ SOAJ injection Inject 0.3 mLs (0.3 mg total) into the muscle as needed for anaphylaxis. 1 each prn   ferrous sulfate 325 (65 FE) MG tablet Take 325 mg by mouth daily with breakfast. Take vitamin C along with ferrous sulfated     fexofenadine (ALLEGRA) 180 MG tablet Take 180 mg by mouth daily.     fluticasone furoate-vilanterol (BREO ELLIPTA) 100-25 MCG/INH AEPB Inhale 1 puff into the lungs daily. 1 each 11   folic acid (FOLVITE) 1 MG tablet Take 1 mg by mouth daily.     hyoscyamine (LEVSIN) 0.125 MG tablet Take 1 tablet (0.125 mg total) by mouth as needed for cramping. Reported on 09/24/2015 10 tablet 2   Immune Globulin 10% (GAMUNEX-C) 10 GM/100ML SOLN Inject into the vein. Every 3 weeks     levothyroxine (SYNTHROID) 50 MCG tablet Take 1 tablet (50 mcg total) by mouth daily before breakfast. 90 tablet 1   ondansetron (ZOFRAN) 8 MG tablet Take by mouth every 8 (eight) hours as needed for nausea or vomiting.     pantoprazole (PROTONIX) 40 MG tablet Take 40 mg by mouth daily.     rizatriptan (MAXALT-MLT) 10 MG disintegrating tablet Take 1 tablet (10 mg total) by mouth as needed for migraine. May repeat in 2 hours if needed 9 tablet 11   senna-docusate (SENOKOT-S) 8.6-50 MG tablet Take 2 tablets by mouth daily as needed  for mild constipation.     triamcinolone cream (KENALOG) 0.1 % Apply topically 2 (two) times daily.     UNABLE TO FIND Take 2.4 mg by mouth 2 (two) times daily. Med Name: Lithium OTC     No current facility-administered medications on file prior to visit.    Review of Systems:  As per HPI- otherwise negative.   Physical Examination: Vitals:   07/31/21 1321  BP: 112/72  Pulse: 92  Resp: 18  Temp: 97.9 F (36.6 C)  SpO2: 100%   Vitals:   07/31/21 1321  Weight: 198 lb 12.8 oz (90.2 kg)  Height: 5\' 4"  (1.626 m)   Body mass index is 34.12 kg/m. Ideal Body Weight: Weight in (lb) to have BMI = 25: 145.3  GEN: no acute distress.  Obese, looks well and her usual self HEENT: Atraumatic, Normocephalic.  Ears and Nose: No external deformity. CV: RRR, No M/G/R. No JVD. No thrill. No extra heart sounds. PULM: CTA B, no wheezes, crackles, rhonchi. No retractions. No resp. distress. No accessory muscle use. ABD: S, NT, ND, +BS. No rebound. No HSM. EXTR: No c/c/e PSYCH: Normally interactive. Conversant.  Foot: The left medial great toe shows significant callus buildup She is tender over the left SI joint Assessment and Plan: Foot callus  Chronic left SI joint pain  Patient seen today with a couple of concerns.  She has had pain in her left SI joint and lower back, this is a longstanding pain for her and does not really concern her.  However apparently she had an episode where the pain seemed to wrap into her left hip back in November.  She wonders if this might have been diverticulitis.  It was not associated with any abdominal pain, nausea or diarrhea.  I advised her this likely was not diverticulitis, but certainly if it comes back we  can obtain a CT scan at that time  I encouraged her to try a urea and salicylic acid cream for the thick calluses on her feet Otherwise she is stable, she will follow-up with me as usual Signed Lamar Blinks, MD

## 2021-07-30 DIAGNOSIS — D801 Nonfamilial hypogammaglobulinemia: Secondary | ICD-10-CM | POA: Diagnosis not present

## 2021-07-31 ENCOUNTER — Ambulatory Visit (INDEPENDENT_AMBULATORY_CARE_PROVIDER_SITE_OTHER): Payer: Commercial Managed Care - HMO | Admitting: Family Medicine

## 2021-07-31 VITALS — BP 112/72 | HR 92 | Temp 97.9°F | Resp 18 | Ht 64.0 in | Wt 198.8 lb

## 2021-07-31 DIAGNOSIS — M533 Sacrococcygeal disorders, not elsewhere classified: Secondary | ICD-10-CM

## 2021-07-31 DIAGNOSIS — L84 Corns and callosities: Secondary | ICD-10-CM | POA: Diagnosis not present

## 2021-07-31 DIAGNOSIS — G8929 Other chronic pain: Secondary | ICD-10-CM

## 2021-07-31 NOTE — Patient Instructions (Addendum)
Let me know if the abdominal pain comes back- we can do a CT scan at that time and try to determine the cause of your pain  Try a salicylic acid and urea foot cream for your calluses  Try the salon pas lidocaine patches for your back pain as well   Take care!

## 2021-08-04 DIAGNOSIS — Z713 Dietary counseling and surveillance: Secondary | ICD-10-CM | POA: Diagnosis not present

## 2021-08-04 DIAGNOSIS — E038 Other specified hypothyroidism: Secondary | ICD-10-CM | POA: Diagnosis not present

## 2021-08-11 ENCOUNTER — Encounter: Payer: Self-pay | Admitting: General Practice

## 2021-08-11 DIAGNOSIS — C9110 Chronic lymphocytic leukemia of B-cell type not having achieved remission: Secondary | ICD-10-CM | POA: Diagnosis not present

## 2021-08-11 NOTE — Progress Notes (Signed)
[place] Spiritual Care Note  Missed scheduled follow-up with Morgan [name] due to a death in my family. Followed up by phone, providing spiritual/emotional support, pastoral reflection, and encouragement as she shared and processed health updates after today's appointments at [facility]. Scheduled follow-up appointment (hopefully for walk in [facility]) on Thursday 2/2.   [place], [place], [facility] Pager [phone] Voicemail [phone]

## 2021-08-14 DIAGNOSIS — Z713 Dietary counseling and surveillance: Secondary | ICD-10-CM | POA: Diagnosis not present

## 2021-08-15 DIAGNOSIS — R0789 Other chest pain: Secondary | ICD-10-CM | POA: Diagnosis not present

## 2021-08-15 DIAGNOSIS — Z959 Presence of cardiac and vascular implant and graft, unspecified: Secondary | ICD-10-CM | POA: Diagnosis not present

## 2021-08-15 DIAGNOSIS — C9110 Chronic lymphocytic leukemia of B-cell type not having achieved remission: Secondary | ICD-10-CM | POA: Diagnosis not present

## 2021-08-15 DIAGNOSIS — I493 Ventricular premature depolarization: Secondary | ICD-10-CM | POA: Diagnosis not present

## 2021-08-18 DIAGNOSIS — D839 Common variable immunodeficiency, unspecified: Secondary | ICD-10-CM | POA: Diagnosis not present

## 2021-08-18 DIAGNOSIS — C9112 Chronic lymphocytic leukemia of B-cell type in relapse: Secondary | ICD-10-CM | POA: Diagnosis not present

## 2021-08-19 DIAGNOSIS — D801 Nonfamilial hypogammaglobulinemia: Secondary | ICD-10-CM | POA: Diagnosis not present

## 2021-08-20 ENCOUNTER — Other Ambulatory Visit: Payer: Self-pay

## 2021-08-20 ENCOUNTER — Ambulatory Visit (INDEPENDENT_AMBULATORY_CARE_PROVIDER_SITE_OTHER): Payer: Medicare Other | Admitting: Family Medicine

## 2021-08-20 ENCOUNTER — Encounter: Payer: Self-pay | Admitting: Family Medicine

## 2021-08-20 VITALS — BP 114/78 | HR 84 | Ht 64.0 in | Wt 192.1 lb

## 2021-08-20 DIAGNOSIS — G43909 Migraine, unspecified, not intractable, without status migrainosus: Secondary | ICD-10-CM

## 2021-08-20 MED ORDER — RIZATRIPTAN BENZOATE 10 MG PO TBDP: 10.0000 mg | ORAL_TABLET | ORAL | 11 refills | Status: DC | PRN

## 2021-08-20 NOTE — Patient Instructions (Signed)
Below is our plan:  We will continue the Rizatriptan as needed for abortive therapy.  She will monitor closely and let us know if she feels she needs additional treatment for her headaches.   Please make sure you are staying well hydrated. I recommend 50-60 ounces daily. Well balanced diet and regular exercise encouraged. Consistent sleep schedule with 6-8 hours recommended.   Please continue follow up with care team as directed.   Follow up with me in 1 year or sooner if needed  You may receive a survey regarding today's visit. I encourage you to leave honest feed back as I do use this information to improve patient care. Thank you for seeing me today!

## 2021-08-20 NOTE — Progress Notes (Signed)
Chief Complaint  Patient presents with   Follow-up    Rm 10, alone. Here for yearly migraine f/u. Pt reports doing well. Learning to manage HA. Mainly at night or morning.      HISTORY OF PRESENT ILLNESS:  08/20/21 ALL:  Morgan [name] is a 56 y.o. female here today for follow up for migraines. She remains on rizatriptan as needed for abortive therapy and endorses it is helpful. She reports that she has, on average, 12 HA / month and 4-8 migraine days. She tries Excedrin or tylenol first, then will resort to the rizatriptan. She uses the rizatriptan about 6 times per month. She has not typically needed to repeat dose.  We discussed injectables for preventative and she is not interested in this at this time since she will potentially be restarting cancer treatment soon and doesn't want to add on additional medications.   Her recent HST did not show any sleep breathing disorder. She denies having anymore recent episodes of gasping for air.   She reports her mood has been poor recently but endorses that she takes lithium orotate that she gets online. She finds this helpful for her. She stills sees a therapist and counselors with oncology for support. She exercises regularly. She has two physical trainers.   HISTORY (copied from previous note)  ALL 08/20/20 Morgan [name] is a 56 y.o. female here today for follow up for migraines and numbness. She has continued rizatriptan for abortive therapy. She reports headache days wax and wane. Some months she may have 2-3 headache days and other months she has 9-12 headache days. She usually has 1-3 migraines each month. Usually takes rizatriptan 1-3 times a month. Most wake her from her sleep. If she gets up, headache usually gets better. She had sleep study about 10 years ago that was reportedly normal.  NCS/EMG showed mild carpal tunnel syndrome. Labs were unremarkable with exception of low IgG, IgA and IgM. She continues to follow closely with  oncology. Mood is stable. Not on stabilizers at this time. Continues to see counselor. She has extensive allergy list and is hesitant to try new meds.    Tried and failed: topiramate, gabapentin (worsened Bipolar), rizatriptan  REVIEW OF SYSTEMS: Out of a complete 14 system review of symptoms, the patient complains only of the following symptoms, migraines, depression, fatigue, and all other reviewed systems are negative.   ALLERGIES: Allergies  Allergen Reactions   Aripiprazole Anaphylaxis and Swelling   Ciprofloxacin Shortness Of Breath   Lamotrigine Rash and Other (See Comments)   Nitrofurantoin Hives   Other Anaphylaxis, Hives and Swelling    Walnuts and pine nuts   Peanut Oil Anaphylaxis, Hives, Swelling and Rash   Amoxicillin Hives and Rash    Update - pt states she has been tested by her allergist and is no longer allergic to penicillin 03/2021   Doxycycline Hives and Rash   Latex Rash and Other (See Comments)   Meloxicam Itching and Other (See Comments)    Induces mania Interacts with Lithium   Naproxen Hives and Other (See Comments)    Interacts with lithium   Pramipexole Hives and Rash   Prednisone Other (See Comments)    Interacts with Lithium   Risperidone Hives and Rash   Sulfa Antibiotics Rash    "that leaves scarring"   Tape Itching and Rash    Steri-StripsT   Cortisone Other (See Comments)    Exacerbates the mania of her bipolar   Keflex [Cephalexin]  Pt developed a rash after taking high dose keflex for several days    Percocet [Oxycodone-Acetaminophen] Other (See Comments)    Severe dizziness   Shingrix [Zoster Vac Recomb Adjuvanted] Swelling    rash   Vancomycin Itching    Itching on head and at IV insertion site where Vanc running   Cuvitru [Immune Globulin (Human)] Rash    Also allergic to Hizentra     HOME MEDICATIONS: Outpatient Medications Prior to Visit  Medication Sig Dispense Refill   acetaminophen (TYLENOL) 500 MG tablet Take 1,000 mg  by mouth every 6 (six) hours as needed for mild pain.     albuterol (VENTOLIN HFA) 108 (90 Base) MCG/ACT inhaler Inhale 1-2 puffs into the lungs every 6 (six) hours as needed for wheezing or shortness of breath. 18 g 6   aspirin-acetaminophen-caffeine (EXCEDRIN EXTRA STRENGTH) 250-250-65 MG tablet Take 2 tablets by mouth every 6 (six) hours as needed for headache.     diphenhydrAMINE (BENADRYL) 25 MG tablet Take 1 tablet (25 mg total) by mouth every 6 (six) hours as needed for itching. (Patient taking differently: Take 25 mg by mouth every 6 (six) hours as needed for itching. Uses as premed prior to Hizentra) 20 tablet 0   EPINEPHrine 0.3 mg/0.3 mL IJ SOAJ injection Inject 0.3 mLs (0.3 mg total) into the muscle as needed for anaphylaxis. 1 each prn   ferrous sulfate 325 (65 FE) MG tablet Take 325 mg by mouth daily with breakfast. Take vitamin C along with ferrous sulfated     fexofenadine (ALLEGRA) 180 MG tablet Take 180 mg by mouth daily.     fluticasone furoate-vilanterol (BREO ELLIPTA) 100-25 MCG/INH AEPB Inhale 1 puff into the lungs daily. 1 each 11   folic acid (FOLVITE) 1 MG tablet Take 1 mg by mouth daily.     hyoscyamine (LEVSIN) 0.125 MG tablet Take 1 tablet (0.125 mg total) by mouth as needed for cramping. Reported on 09/24/2015 10 tablet 2   Immune Globulin 10% (GAMUNEX-C) 10 GM/100ML SOLN Inject into the vein. Every 3 weeks     levothyroxine (SYNTHROID) 50 MCG tablet Take 1 tablet (50 mcg total) by mouth daily before breakfast. 90 tablet 1   ondansetron (ZOFRAN) 8 MG tablet Take by mouth every 8 (eight) hours as needed for nausea or vomiting.     pantoprazole (PROTONIX) 40 MG tablet Take 40 mg by mouth daily.     senna-docusate (SENOKOT-S) 8.6-50 MG tablet Take 2 tablets by mouth daily as needed for mild constipation.     triamcinolone cream (KENALOG) 0.1 % Apply topically 2 (two) times daily.     UNABLE TO FIND Take 2.4 mg by mouth 2 (two) times daily. Med Name: Lithium OTC      rizatriptan (MAXALT-MLT) 10 MG disintegrating tablet Take 1 tablet (10 mg total) by mouth as needed for migraine. May repeat in 2 hours if needed 9 tablet 11   No facility-administered medications prior to visit.     PAST MEDICAL HISTORY: Past Medical History:  Diagnosis Date   Anxiety    Arthritis    Asthma    Bipolar disorder ([place])    Bulging lumbar disc 07/19/05   Chronic headaches    Chronic lymphocytic leukemia ([place]) 07/31/2016   Colon polyps    Degenerative disorder of bone    Depression    History of borderline personality disorder    Hypothyroidism 2007   Developed after use of Lithium   IBS (irritable bowel syndrome) 1995  Pneumonia    Proctitis    Shingles 07/2018   Substance abuse ([place])    ativan last month     PAST SURGICAL HISTORY: Past Surgical History:  Procedure Laterality Date   ABDOMINAL HYSTERECTOMY     APPENDECTOMY     BUNIONECTOMY Right 06/2012   CHOLECYSTECTOMY     OOPHORECTOMY     SHOULDER OPEN ROTATOR CUFF REPAIR Right 03/2010   TUBAL LIGATION       FAMILY HISTORY: Family History  Problem Relation Age of Onset   Depression Mother    Hypertension Mother    Thyroid disease Father    Alzheimer's disease Father    Post-traumatic stress disorder Sister    Alcohol abuse Brother    Drug abuse Brother    Post-traumatic stress disorder Brother    COPD Maternal Grandmother    Heart disease Maternal Grandfather    Arthritis Paternal Grandmother    Diabetes Paternal Grandmother    Depression Paternal Grandmother    Alzheimer's disease Paternal Grandmother    Heart disease Paternal Grandfather    Coronary artery disease Paternal Grandfather    Alcohol abuse Paternal Grandfather    Sleep apnea Cousin    Diabetes Maternal Uncle    Colon cancer Neg Hx    Breast cancer Neg Hx      SOCIAL HISTORY: Social History   Socioeconomic History   Marital status: Divorced    Spouse name: Not on file   Number of children: 2   Years of  education: 14   Highest education level: Not on file  Occupational History    Comment: [employer]  Tobacco Use   Smoking status: Former    Types: Cigarettes    Quit date: 07/20/1980    Years since quitting: 41.1   Smokeless tobacco: Never   Tobacco comments:    Also exposed to second hand smoke as a child  Vaping Use   Vaping Use: Never used  Substance and Sexual Activity   Alcohol use: Yes    Alcohol/week: 0.0 standard drinks    Comment: occ   Drug use: Not Currently   Sexual activity: Not Currently    Birth control/protection: Surgical    Comment: intercourse age unknown,sexual partners less than 5  Other Topics Concern   Not on file  Social History Narrative   Fun: [place], travel, music, writing, walking, hiking, volunteering   Denies religious beliefs effecting health care.    Feels safe at home.    Divorced,   Children 2.  Lives home alone.   Right handed dominant    Caffeine: 2 cups/day   Social Determinants of Health   Financial Resource Strain: Medium Risk   Difficulty of Paying Living Expenses: Somewhat hard  Food Insecurity: Food Insecurity Present   Worried About Charity fundraiser in the Last Year: Sometimes true   Ran Out of Food in the Last Year: Never true  Transportation Needs: No Transportation Needs   Lack of Transportation (Medical): No   Lack of Transportation (Non-Medical): No  Physical Activity: Insufficiently Active   Days of Exercise per Week: 2 days   Minutes of Exercise per Session: 20 min  Stress: Stress Concern Present   Feeling of Stress : To some extent  Social Connections: Moderately Isolated   Frequency of Communication with Friends and Family: Once a week   Frequency of Social Gatherings with Friends and Family: Once a week   Attends Religious Services: 1 to 4 times per year  Active Member of Clubs or Organizations: Yes   Attends Archivist Meetings: 1 to 4 times per year   Marital Status: Divorced  Human resources officer Violence:  Not At Risk   Fear of Current or Ex-Partner: No   Emotionally Abused: No   Physically Abused: No   Sexually Abused: No     PHYSICAL EXAM  Vitals:   08/20/21 1027  BP: 114/78  Pulse: 84  Weight: 192 lb 1.6 oz (87.1 kg)  Height: 5\' 4"  (1.626 m)   Body mass index is 32.97 kg/m.  Generalized: Well developed, in no acute distress  Cardiology: normal rate and rhythm, no murmur auscultated  Respiratory: clear to auscultation bilaterally    Neurological examination  Mentation: Alert oriented to time, place, history taking. Follows all commands speech and language fluent Cranial nerve II-XII: Pupils were equal round reactive to light. Extraocular movements were full, visual field were full on confrontational test. Facial sensation and strength were normal. Head turning and shoulder shrug  were normal and symmetric. Motor: The motor testing reveals 5 over 5 strength of all 4 extremities. Good symmetric motor tone is noted throughout.  Gait and station: Gait is normal.    DIAGNOSTIC DATA (LABS, IMAGING, TESTING) - I reviewed patient records, labs, notes, testing and imaging myself where available.  Lab Results  Component Value Date   WBC 4.5 03/17/2021   HGB 13.9 03/17/2021   HCT 41.6 03/17/2021   MCV 83.0 03/17/2021   PLT 214.0 03/17/2021      Component Value Date/Time   NA 139 12/24/2020 0320   NA 139 08/12/2020 0000   NA 142 03/10/2017 0954   NA 142 09/04/2016 0754   K 3.8 12/24/2020 0320   K 4.2 03/10/2017 0954   K 4.0 09/04/2016 0754   CL 104 12/24/2020 0320   CL 111 (H) 03/10/2017 0954   CO2 27 12/24/2020 0320   CO2 28 03/10/2017 0954   CO2 25 09/04/2016 0754   GLUCOSE 91 12/24/2020 0320   GLUCOSE 83 03/10/2017 0954   BUN 14 12/24/2020 0320   BUN 13 08/12/2020 0000   BUN 11 03/10/2017 0954   BUN 13.4 09/04/2016 0754   CREATININE 0.81 12/24/2020 0320   CREATININE 0.87 10/12/2018 0832   CREATININE 1.1 03/10/2017 0954   CREATININE 0.8 09/04/2016 0754    CALCIUM 9.4 12/24/2020 0320   CALCIUM 9.6 03/10/2017 0954   CALCIUM 9.7 09/04/2016 0754   PROT 7.3 12/24/2020 0320   PROT 6.6 07/26/2019 1307   PROT 6.8 03/10/2017 0954   PROT 6.6 09/04/2016 0754   ALBUMIN 4.1 12/24/2020 0320   ALBUMIN 3.7 03/10/2017 0954   ALBUMIN 4.0 09/04/2016 0754   AST 22 12/24/2020 0320   AST 22 10/12/2018 0832   AST 20 09/04/2016 0754   ALT 12 12/24/2020 0320   ALT 20 10/12/2018 0832   ALT 20 03/10/2017 0954   ALT 14 09/04/2016 0754   ALKPHOS 66 12/24/2020 0320   ALKPHOS 46 03/10/2017 0954   ALKPHOS 54 09/04/2016 0754   BILITOT 0.4 12/24/2020 0320   BILITOT 0.4 10/12/2018 0832   BILITOT 0.39 09/04/2016 0754   GFRNONAA >60 12/24/2020 0320   GFRNONAA >60 10/12/2018 0832   GFRAA >60 02/15/2020 1611   GFRAA >60 10/12/2018 0832   Lab Results  Component Value Date   CHOL 262 (H) 01/02/2021   HDL 77.90 01/02/2021   LDLCALC 103 (H) 04/27/2018   LDLDIRECT 140.0 01/02/2021   TRIG 248.0 (H) 01/02/2021   CHOLHDL  3 01/02/2021   Lab Results  Component Value Date   HGBA1C 5.1 11/20/2020   Lab Results  Component Value Date   VITAMINB12 407 01/18/2020   Lab Results  Component Value Date   TSH 3.51 03/17/2021    MMSE - Mini Mental State Exam 10/22/2015  Not completed: (No Data)     No flowsheet data found.   ASSESSMENT AND PLAN  56 y.o. year old female  has a past medical history of Anxiety, Arthritis, Asthma, Bipolar disorder ([place]), Bulging lumbar disc (07/19/05), Chronic headaches, Chronic lymphocytic leukemia ([place]) (07/31/2016), Colon polyps, Degenerative disorder of bone, Depression, History of borderline personality disorder, Hypothyroidism (2007), IBS (irritable bowel syndrome) (1995), Pneumonia, Proctitis, Shingles (07/2018), and Substance abuse ([place]). here with    Migraine without status migrainosus, not intractable, unspecified migraine type - Plan: rizatriptan (MAXALT-MLT) 10 MG disintegrating tablet  She feels that migraines are well  managed on abortive therapy. Not interested in prevention therapy at this time. We will continue the Rizatriptan as needed for abortive therapy. OTC analgesics may be used sparingly. She will monitor closely and let us know if she feels she needs additional treatment for her headaches. She was advised to follow up closely with PCP and oncology. Mood seems to be improving on OTC lithium orotate. Healthy lifestyle habits encouraged. She will follow up in 1 year or sooner if needed.   No orders of the defined types were placed in this encounter.    Meds ordered this encounter  Medications   DISCONTD: rizatriptan (MAXALT-MLT) 10 MG disintegrating tablet    Sig: Take 1 tablet (10 mg total) by mouth as needed for migraine. May repeat in 2 hours if needed    Dispense:  9 tablet    Refill:  11   rizatriptan (MAXALT-MLT) 10 MG disintegrating tablet    Sig: Take 1 tablet (10 mg total) by mouth as needed for migraine. May repeat in 2 hours if needed    Dispense:  9 tablet    Refill:  11    Order Specific Question:   Supervising Provider    Answer:   Melvenia Beam [[id number]]      Debbora Presto, MSN, FNP-C 08/20/2021, 12:40 PM  [facility] [address], [place], [place] [address] [phone]

## 2021-08-21 ENCOUNTER — Ambulatory Visit (INDEPENDENT_AMBULATORY_CARE_PROVIDER_SITE_OTHER): Payer: Medicare Other | Admitting: Pharmacist

## 2021-08-21 DIAGNOSIS — J45909 Unspecified asthma, uncomplicated: Secondary | ICD-10-CM

## 2021-08-21 DIAGNOSIS — E039 Hypothyroidism, unspecified: Secondary | ICD-10-CM

## 2021-08-21 DIAGNOSIS — R1084 Generalized abdominal pain: Secondary | ICD-10-CM

## 2021-08-21 NOTE — Chronic Care Management (AMB) (Signed)
Chronic Care Management Pharmacy Note  08/21/2021 Name:  Morgan [name] MRN:  [id number] DOB:  Jun 25, 1966  Subjective: Morgan [name] is an 56 y.o. year old female who is a primary patient of Copland, Gay Filler, MD.  The CCM team was consulted for assistance with disease management and care coordination needs.    Engaged with patient by telephone for follow up visit in response to provider referral for pharmacy case management and/or care coordination services.   Consent to Services:  The patient was given information about Chronic Care Management services, agreed to services, and gave verbal consent prior to initiation of services.  Please see initial visit note for detailed documentation.   Patient Care Team: Copland, Gay Filler, MD as PCP - General (Family Medicine) Binnie Rail, MD (Internal Medicine) Lanette Hampshire, MD as Referring Physician (Internal Medicine) Jeanann Lewandowsky, MD as Referring Physician (Oncology) [name], RPH-CPP (Pharmacist) Mcarthur Rossetti, MD as Consulting Physician (Orthopedic Surgery) Rockne Coons, MD (Infectious Diseases)  Recent office visits: 07/31/2021 - PCP (Dr Lorelei Pont) Seen for foot callus, back and hip pain. OTC recommendations - salicylic acid and urea for callusis on foot; Salonpas lidocaine patches for back pain as needed.  04/23/2021 - PCP (Dr Lorelei Pont) Skin rash and physical. Prescribed prednisone 35m for 3 days, then 222mfor 3 day, then stop 04/09/2021 - PCP (Dr WeNani Ravensskin infection. Prescribed cephalexin 50055m times a day for 7 days. 03/17/2021 - PCP (Dr CopLorelei Ponteen for rash. She was recently seen by [facility] allergy and immunology-she is getting subcu replacement therapy of acalabrutinib -8/11  Recent consult visits: 08/20/2021 -Neuro (lomax, NP) F/U migraines. No med changes. 08/15/2021 - Cardio (Dr KhoLora Paulaeen for intermittant palpitation, Chest pain and dizziness. Loop recorder demonstrated NSR with  periods of sinus tachycardia but no arrhythmias. No med changes noted.   08/14/2021 - [facility]: Recommendation: Eat a healthy diet based on the dietitian's recommendations. Daily exercise, 20-30 minutes, to increase energy and enhance sense of well-being. Ensure adequate sleep, 6-8 hours each night. Avoid caffeine or watching tv late at night. Drink 64 ounces of water daily.Care Plan: Nutrition coaching follow up; biweekly follow up with NP and RD; declines CL BH referral; declines bariatric surgery referral; [facility]: 1. 198.5 lbs12/29/22  2. 198.3 lbs 08/04/21 3. 195.4 lbs 08/14/21 Long Term Goal Weight: 150 lbs Initial Weight goal: Initial 5% (~9.9 lbs) Amount of weight lost since the last appointment: -2.9 lbs Total weight lost: -3.1 lbs 08/11/2021 - Oncology (Dr BraDorothea Ogleee for F/U CLL. Continue IVIG and antiviral prophylaxis.  07/17/2021 - [facility] (NovHiddeniteicLincoln HeightseaDamaris SchoonerNP; Care Plan: NP visits weekly x 4 weeks then reevaluate, RD/Fitness/BH visits TBD. As a part of the [employer] program, as referred by MD, pt will receive Health Coaching. Complete a movement consult with the exercise specialist. She was offered a behavioral health assessment. She is [facility]. No medication prescribed 07/02/2021 - Neuro (Dr AthRexene Albertsleep study. Study does not demonstrate any significant obstructive or central sleep disordered breathing with the exception of mild to moderate snoring. This study does not support an intrinsic sleep disorder as a cause of the patient's symptoms. Other causes, including circadian rhythm disturbances, an underlying mood disorder, medication effect and/or an underlying medical problem cannot be ruled out. Weight loss may aid in reducing her snoring. Study showed sleep fragmentation and abnormal sleep stage percentages; these are nonspecific findings and per  se do not signify an intrinsic sleep  disorder or a cause for the patient's sleep-related symptoms. Causes include (but are not limited to) the first night effect of the sleep study, circadian rhythm disturbances, medication effect or an underlying mood disorder or medical problem. 06/03/2021 - [facility] (J.  F/U CLL. Continued ferrous sulfate 329m every other day with vit C. PET results show progression of LAD throughout but all remain relatively small. Given the progression in the neck LAD, the recommendation per TxID and our team, would be for repeat LN biopsy given last inconclusive results. She had wished to delay biopsy at this time.  06/03/2021 - DSelahscan. IMPRESSION: 1.  Significant increase in size of metabolically active adenopathy above and below the diaphragm with FDG activity moderately above liver. Axillary lymph nodes are the most uniformly avid and would be amenable to sampling. 2.  Similar diffusely increased activity within the spleen, suggesting CLL involvement 05/21/2021 - Neuro (Dr ARexene Alberts [facility]-Initial consult. Sleep study ordered 05/05/2021 - Pulm - ([facility] Follow up. She was on Hizentra 20 grams every other week (~500 mg/kg) - with persistent local reactions, and persistent skin irritation (even without using the tape that was thought to be triggering). Thus, we are switching her to IVIG (40 grams q3 weeks, awaiting insurance approval). Had breakthrough infectious symptoms when dosed every 4 weeks with Dr. BDorothea Ogle For her acute lower back pain today, she is going to her local ED to be assessed given limitations here in clinic.  04/07/2021 - Oncology (Amy Tammenga, FNP-BC) Seen for CLL / hypogammaglobulinemia. She presents for a scheduled follow up OFF acalabrutinib. Since stopping acalabrutinib her muscle cramps and neuropathy symptoms have resolved. Continued on immunoglobulin replacement with immunology/allergy. May switch to home IVIG. She remains on antiviral prophylaxis at  this time. Indications for IVIG were reviewed. IgG consistently <500, now on SQIG with possible plan to change to home health IVIG. Return in 2 months for PET/CT, labs and visit Recurrent Shingles Infections - Has recurrent vesicular rash to abdomen - treated with Valtrex 10042mTID and has been on valtrex prophylaxis. Continue Valtrex 50053mwice a day. Started Ferrous Sulfate 325m81mery other day with source of Vitamin C/without food for iron deficiency. Continue holding acalabrutinib due to intolerable SEs. Plan PET CT scan 6 months from her most recent scans.  04/03/2021 - Cardio (Dr CockFarrel DemarkukeRob Hickmanen for history of palpitations and chest tightness. Prior ILR implant. Palpitations are somewhat better since the last visit. Has had episodes of waking up "gasping" and said she is pending evaluation for OSA. Occasional episodes of LH, but no syncopal events.  Has documented episodes of ST on her ILR. No PVCs or PACs seen during the interrogation today. No ectopy on exam. Did not appear to be an inappropriate sinus tachycardia. We walked fairly briskly in the hallway and her rates went to the low 100s. These rates correlated with her watch. We discussed that these are normal ADL rates. We discussed the general causes for a fast heart rates, including stress, pain, anxiety, etc. Reviewed that her stress test and MRI were called normal. Provided reassurance that brief fast heart rates are not life threatening, but we would want to know of any sustained rates (hours-days). She was encouraged to have regular exercise. Overdue for follow-up with Card-Onc and she will make a routine appointment. Resume remote monitoring and next EP visit in a year, and PRN.  Her reported "gasping"  at night very suspicious for OSA and she is pending evaluation. Encouraged to treat as needed.  04/03/2021 - Pulm (Dr Carolynn Sayers)  She is currently on Hizentra 20 grams every other week (~500 mg/kg) - with persistent local  reactions. She is eager to re-trial without the tape she had previously been using. She was not able to tolerate the steroid cream triamcinolone as trialed by dermatology. If reactions persist, will need consider re-starting IVIG (but through home infusions). 03/20/2021 - Pulm (Dr Varney Biles - [facility]) -In the interim, she has seen dermatology who noted the rash was likely contact dermatitis secondary to tape. We increased her dosing to every 2 weeks due to ease of dosing at home, rather than weekly - due Sept 4. She notes she had a reaction to ointment she was given by dermatology last night (triamcinolone ointment). She was unable to lay on her stomach over night. She had throat symptoms "doing its thing" - she felt her abdomen was more swollen, she also noted chest pain. She noted the symptoms started with itching. She trialed the ointment again this AM and is so far not having symptoms. She is currently on Cuvitru 20 grams every other week (~500 mg/kg) - with local reactions (contact dermatitis 2/2 tape, managed by dermatology). Next dose is due on Sept 4: we will hold a dose if she continues to have rash at injection site and advised not to inject through rash. 02/27/2021 - Pulm (Dr Varney Biles - [facility]) Seen for history of CLL, s/p Rituximab + Ibrutinib (last 04/2018), more recently treated with acalabrutinib, with persistent hypogammaglobulinemia (characterized by Low IgG, low IgM, and low IgA). She overall is feeling improved with Hizentra but with mild erythema and some residual pain at injection site. We both continue to feel benefits of continuation outweigh risks, and we will manage her mild side effects. Continue zofran prn for nausea, which has now resolved  Hospital visits: 05/25/2021 ED Visit to [facility]. Seen for infected insect bites. Prescribed clindamycin 147m 3 times a day.  Objective:  Lab Results  Component Value Date   CREATININE 0.81 12/24/2020   CREATININE 0.7 08/12/2020    CREATININE 0.87 08/01/2020    Lab Results  Component Value Date   HGBA1C 5.1 11/20/2020   Last diabetic Eye exam: No results found for: HMDIABEYEEXA  Last diabetic Foot exam: No results found for: HMDIABFOOTEX      Component Value Date/Time   CHOL 262 (H) 01/02/2021 1519   TRIG 248.0 (H) 01/02/2021 1519   HDL 77.90 01/02/2021 1519   CHOLHDL 3 01/02/2021 1519   VLDL 49.6 (H) 01/02/2021 1519   LDLCALC 103 (H) 04/27/2018 1403   LDLDIRECT 140.0 01/02/2021 1519    Hepatic Function Latest Ref Rng & Units 12/24/2020 08/12/2020 08/01/2020  Total Protein 6.5 - 8.1 g/dL 7.3 - 6.9  Albumin 3.5 - 5.0 g/dL 4.1 - 4.7  AST 15 - 41 U/L 22 16 13   ALT 0 - 44 U/L 12 11 10   Alk Phosphatase 38 - 126 U/L 66 51 67  Total Bilirubin 0.3 - 1.2 mg/dL 0.4 - 0.4    Lab Results  Component Value Date/Time   TSH 3.51 03/17/2021 12:15 PM   TSH 2.16 01/02/2021 03:19 PM   FREET4 1.19 03/02/2018 03:24 PM   FREET4 0.65 04/17/2016 10:31 AM    CBC Latest Ref Rng & Units 03/17/2021 12/24/2020 08/12/2020  WBC 4.0 - 10.5 K/uL 4.5 5.5 8.3  Hemoglobin 12.0 - 15.0 g/dL 13.9 15.0 14.2  Hematocrit  36.0 - 46.0 % 41.6 44.9 44  Platelets 150.0 - 400.0 K/uL 214.0 251 259    Lab Results  Component Value Date/Time   VD25OH 43.91 11/20/2020 01:26 PM   VD25OH 25.73 (L) 06/17/2020 11:18 AM    Clinical ASCVD: No  The 10-year ASCVD risk score (Arnett DK, et al., 2019) is: 1.7%   Values used to calculate the score:     Age: 45 years     Sex: Female     Is Non-Hispanic African American: No     Diabetic: No     Tobacco smoker: No     Systolic Blood Pressure: 093 mmHg     Is BP treated: No     HDL Cholesterol: 77.9 mg/dL     Total Cholesterol: 262 mg/dL    Other: (CHADS2VASc if Afib, PHQ9 if depression, MMRC or CAT for COPD, ACT, DEXA)  Social History   Tobacco Use  Smoking Status Former   Types: Cigarettes   Quit date: 07/20/1980   Years since quitting: 41.1  Smokeless Tobacco Never  Tobacco Comments   Also  exposed to second hand smoke as a child   BP Readings from Last 3 Encounters:  08/20/21 114/78  07/31/21 112/72  05/25/21 119/69   Pulse Readings from Last 3 Encounters:  08/20/21 84  07/31/21 92  05/25/21 80   Wt Readings from Last 3 Encounters:  08/20/21 192 lb 1.6 oz (87.1 kg)  07/31/21 198 lb 12.8 oz (90.2 kg)  05/25/21 193 lb (87.5 kg)    Assessment: Review of patient past medical history, allergies, medications, health status, including review of consultants reports, laboratory and other test data, was performed as part of comprehensive evaluation and provision of chronic care management services.   SDOH:  (Social Determinants of Health) assessments and interventions performed:  SDOH Interventions    Flowsheet Row Most Recent Value  SDOH Interventions   Financial Strain Interventions Intervention Not Indicated  Physical Activity Interventions Intervention Not Indicated  [Has imcreased exercise recently - working on core strength]        CCM Care Plan  Allergies  Allergen Reactions   Aripiprazole Anaphylaxis and Swelling   Ciprofloxacin Shortness Of Breath   Lamotrigine Rash and Other (See Comments)   Nitrofurantoin Hives   Other Anaphylaxis, Hives and Swelling    Walnuts and pine nuts   Peanut Oil Anaphylaxis, Hives, Swelling and Rash   Amoxicillin Hives and Rash    Update - pt states she has been tested by her allergist and is no longer allergic to penicillin 03/2021   Doxycycline Hives and Rash   Latex Rash and Other (See Comments)   Meloxicam Itching and Other (See Comments)    Induces mania Interacts with Lithium   Naproxen Hives and Other (See Comments)    Interacts with lithium   Pramipexole Hives and Rash   Prednisone Other (See Comments)    Interacts with Lithium   Risperidone Hives and Rash   Sulfa Antibiotics Rash    "that leaves scarring"   Tape Itching and Rash    Steri-Strips   Cortisone Other (See Comments)    Exacerbates the mania of  her bipolar   Keflex [Cephalexin]     Pt developed a rash after taking high dose keflex for several days    Percocet [Oxycodone-Acetaminophen] Other (See Comments)    Severe dizziness   Shingrix [Zoster Vac Recomb Adjuvanted] Swelling    rash   Vancomycin Itching    Itching on head  and at IV insertion site where Vanc running   Cuvitru [Immune Globulin (Human)] Rash    Also allergic to Hizentra    Medications Reviewed Today     Reviewed by [name], RPH-CPP (Pharmacist) on 08/21/21 at 9  Med List Status: <None>   Medication Order Taking? Sig Documenting Provider Last Dose Status Informant  acetaminophen (TYLENOL) 500 MG tablet [id number] Yes Take 1,000 mg by mouth every 6 (six) hours as needed for mild pain. [provider] Taking Active Self  albuterol (VENTOLIN HFA) 108 (90 Base) MCG/ACT inhaler [id number] No Inhale 1-2 puffs into the lungs every 6 (six) hours as needed for wheezing or shortness of breath.  Patient not taking: Reported on 08/21/2021   Copland, Gay Filler, MD Not Taking Active Self  aspirin-acetaminophen-caffeine Jearld Adjutant EXTRA STRENGTH) [phone] MG tablet [id number] Yes Take 2 tablets by mouth every 6 (six) hours as needed for headache. [provider] Taking Active Self  Cholecalciferol 125 MCG (5000 UT) TABS [id number] Yes Take 7,500 Units by mouth every other day. [provider] Taking Active   diphenhydrAMINE (BENADRYL) 25 MG tablet [id number] Yes Take 1 tablet (25 mg total) by mouth every 6 (six) hours as needed for itching.  Patient taking differently: Take 25 mg by mouth every 6 (six) hours as needed for itching. Uses as premed prior to IVIG   Horton, Barbette Hair, MD Taking Active   EPINEPHrine 0.3 mg/0.3 mL IJ SOAJ injection [id number] Yes Inject 0.3 mLs (0.3 mg total) into the muscle as needed for anaphylaxis. Copland, Gay Filler, MD Taking Active   ferrous sulfate 325 (65 FE) MG tablet [id number] No Take 325 mg by mouth daily with  breakfast. Take vitamin C along with ferrous sulfated  Patient not taking: Reported on 08/21/2021   [provider] Not Taking Active   fexofenadine (ALLEGRA) 180 MG tablet [id number] Yes Take 180 mg by mouth daily. [provider] Taking Active   fluticasone furoate-vilanterol (BREO ELLIPTA) 100-25 MCG/INH AEPB [id number] No Inhale 1 puff into the lungs daily.  Patient not taking: Reported on 08/21/2021   Copland, Gay Filler, MD Not Taking Active Self           Med Note Antony Contras, Maheen Cwikla B   Thu Aug 21, 2021 11:12 AM) Uses daily during allergy season.   folic acid (FOLVITE) 1 MG tablet [id number] Yes Take 1 mg by mouth daily. [provider] Taking Active   hyoscyamine (LEVSIN) 0.125 MG tablet [id number] Yes Take 1 tablet (0.125 mg total) by mouth as needed for cramping. Reported on 09/24/2015 Copland, Gay Filler, MD Taking Active   Immune Globulin 10% (GAMUNEX-C) 10 GM/100ML SOLN [id number] Yes Inject into the vein. Every 3 weeks [provider] Taking Active   levothyroxine (SYNTHROID) 50 MCG tablet [id number] Yes Take 1 tablet (50 mcg total) by mouth daily before breakfast. Copland, Gay Filler, MD Taking Active   lidocaine-prilocaine (EMLA) cream [id number] Yes Apply 1 application topically as needed. [provider] Taking Active   ondansetron (ZOFRAN) 8 MG tablet [id number] Yes Take by mouth every 8 (eight) hours as needed for nausea or vomiting. [provider] Taking Active   pantoprazole (PROTONIX) 40 MG tablet [id number] Yes Take 40 mg by mouth daily. [provider] Taking Active   rizatriptan (MAXALT-MLT) 10 MG disintegrating tablet [id number] Yes Take 1 tablet (10 mg total) by mouth as needed for migraine. May repeat in 2 hours if needed Lomax, Amy, NP Taking Active   senna-docusate (SENOKOT-S) 8.6-50 MG  tablet [id number] Yes Take 2 tablets by mouth daily as needed for mild constipation. [provider] Taking Active   triamcinolone cream  (KENALOG) 0.1 % [id number] No Apply topically 2 (two) times daily.  Patient not taking: Reported on 08/21/2021   [provider] Not Taking Active   UNABLE TO FIND [id number] Yes Take 2.4 mg by mouth 2 (two) times daily. Med Name: Lithium OTC [provider] Taking Active            Med Note Ebony Hail, AMBER B   Thu Feb 15, 2020  4:04 PM)              Patient Active Problem List   Diagnosis Date Noted   Throat discomfort 03/12/2020   Hypothyroidism 11/18/2018   Fever 06/18/2018   Piriformis syndrome of right side 04/22/2017   Lateral epicondylitis of left elbow 04/06/2017   Solitary pulmonary nodule 10/29/2016   Chronic lymphocytic leukemia ([place]) 07/31/2016   Mass in neck 04/17/2016   Abdominal mass 04/17/2016   Asthma 12/12/2015   Multiple environmental allergies 12/12/2015   Chronic lower back pain 09/12/2015   Bipolar 1 disorder, depressed ([place]) 08/27/2015    Class: Chronic   Borderline personality disorder ([place]) 08/15/2015   Severe benzodiazepine use disorder ([place]) 08/15/2015   Alcohol use disorder, moderate, dependence ([place]) 08/15/2015   PTSD (post-traumatic stress disorder) 08/15/2015   History of bipolar disorder 08/15/2015   Pre-syncope 05/17/2015   Skull deformity 05/17/2015   Patellofemoral syndrome of both knees 05/02/2015    Immunization History  Administered Date(s) Administered   Influenza, Quadrivalent, Recombinant, Inj, Pf 04/28/2018   Influenza,inj,Quad PF,6+ Mos 03/20/2019   Influenza-Unspecified 04/15/2015, 05/19/2017   Moderna Sars-Covid-2 Vaccination 10/03/2019, 11/03/2019, 06/08/2020   Tdap 04/15/2015   Zoster Recombinat (Shingrix) 02/23/2019    Conditions to be addressed/monitored: Hypertriglyceridemia, Bipolar Disorder, and asthma; GERD / abdominal cramping; hypothyroidism; CLL  Care Plan : General Pharmacy (Adult)  Updates made by [name], RPH-CPP since 08/21/2021 12:00 AM     Problem: Chronic Disease Management  support, education, and care coordination needs related to Asthma, Hypothyroidism, Bipolar Disorder, Leukemia, Stomach Cramping, Recurrent Shingles, Migraine, Allergy      Long-Range Goal: Patient-Specific Goal   Start Date: 11/08/2020  This Visit's Progress: On track  Recent Progress: On track  Priority: High  Note:   Current Barriers:  Chronic Disease Management support, education, and care coordination needs related to Asthma, Hypothyroidism, Bipolar Disorder, Leukemia, Stomach Cramping, Recurrent Shingles, Migraine, Allergy  Pharmacist Clinical Goal(s):  Over the next 90 days, patient will achieve adherence to monitoring guidelines and medication adherence to achieve therapeutic efficacy through collaboration with PharmD and provider.   Interventions: 1:1 collaboration with Copland, Gay Filler, MD regarding development and update of comprehensive plan of care as evidenced by provider attestation and co-signature Inter-disciplinary care team collaboration (see longitudinal plan of care) Comprehensive medication review performed; medication list updated in electronic medical record  Asthma: Controlled Current regimen:  Albuterol inhaler (ProAir) as needed Breo Ellipta 100-28mg 1 puff daily (uses daily only during allergy season) Patient tried to limit steroids due to CLL Patient reports she has not needed to use albuterol in the last month Continued episodes of awaking with SOB. Saw cardiologist and he has ordered sleep study. Patient has seen neuro and ordered sleep study.  Interventions:  Reviewed inhaler use / technique (at previous visit) Reviewed maintenance versus rescue inhaler. Reminded patient that BMemory Danceis maintenance inhaler - use every day; Albuterol is rescue inhaler  and is used when you have shortness of breath or wheezing. Reminded patient to rinse mouth after using Breo to prevent infection / thrush.   Bipolar 1: Current regimen:  OTC lithium daily   Counseling Patient has restarted OTC lithium 66m daily [facility] and also has biweekly video / telephone sessions with counselor and [facility]  Interventions: Encouraged continued follow up with counselors and spiritual care  Migraine Headaches Controlled Saw neurology for f/u migraines 08/20/2021 Tried in past - imitrex Current regimen:  Rizatriptan 135mas needed Excedrin migraine as needed Interventions: Recommended maintain migraine medication regimen and follow up as planned with neurologist  Hypothyroidism Improved - TSH now at therapeutic goal and patient reports energy level improved with change in levothyroxine dose from 2554mto 21m31mn May 2022. Current regimen:  Levothyroxine 21mc60mily Interventions: Maintain thyroid medication regimen   Acid Reflux / GERD / abdominal pain:  Improved Current regimen:  Pantoprazole 40mg 18my Hyoscamine 0.125mg -44me as needed for stomach cramping Patient states she only fills hyoscyamine 10 tablets at a time due to cost.  Hyoscyamine is not on her [employer] [place] formulary Interventions: Patient has Good Rx Card - recommended she use next time she fills hyoscyamine. Cost should be $8 for 30 tablets (cost savings of about $22) Continue current medications for GERD and abdominal pain  Medication management Current pharmacy: [facility] Interventions Comprehensive medication review performed. Updated medication list Reviewed refill history and adherence Continue current medication management strategy  Health Maintenance  Patient is unable to take second dose of Shingrix due to arms swelling with first dose.  Discussed with patient continued need for Chronic Care Management program. She asked that we continue follow up but will change to every 6 months. She is aware she can call office sooner if she has questions.    Patient  Goals/Self-Care Activities Over the next 90 days, patient will:  take medications as prescribed target a minimum of 150 minutes of moderate intensity exercise weekly Use Good Rx card with next refill of hyoscyamine  Follow Up Plan: Telephone follow up appointment with care management team member scheduled for:  6 months          Medication Assistance: None required.  Patient affirms current coverage meets needs.  Patient's preferred pharmacy is:  [id number] [phone] #86[address] [address] OF [facility] POHarve[place] LakeAGolconda[id number] [phone] Anthonys HospitalTORE #[id number] [phone] County HospitalTORE #[id number] [phone] [address]ampton[address] [place] [phone] Up:  Patient agrees to Care Plan and Follow-up.  Plan: Telephone follow up appointment with care management team member scheduled for:  pharmacist in 6 months  Amariyana Heacox ECherre RobinsD Clinical Pharmacist [place] [phone]

## 2021-08-21 NOTE — Patient Instructions (Signed)
Morgan [name], It was a pleasure speaking with you today.  I have attached a summary of our visit today and information about your health goals.   Patient Goals/Self-Care Activities Over the next 90 days, patient will:  take medications as prescribed target a minimum of 150 minutes of moderate intensity exercise weekly Use Good Rx card with next refill of hyoscyamine   If you have any questions or concerns, please feel free to contact me either at the phone number below or with a MyChart message.   Please call the care guide team at [phone] if you need to cancel or reschedule your appointment.    Keep up the good work!  [name], PharmD Clinical Pharmacist [facility] [phone] (direct line)  [phone] (main office number)   CARE PLAN ENTRY  Current Barriers:  Chronic Disease Management support, education, and care coordination needs related to Asthma, Hypothyroidism, Bipolar Disorder, Leukemia, Stomach Cramping, Recurrent Shingles, Migraine, Allergy  Asthma Pharmacist Clinical Goal(s) Over the next 90 days, patient will work with PharmD and providers to reduce symptoms of asthma Current regimen:  Albuterol inhaler (ProAir) as needed Breo Ellipta 100-80mcg 1 puff daily Interventions:  Education provided regarding maintenance versus rescue inhaler and when to use.  Reviewed inhaler use / technique Patient self care activities - Over the next 120 days, patient will: Maintain asthma medication regimen Remember Memory Dance is maintenance inhaler - use every day during allergy season Albuterol is rescue inhaler and is used when you have shortness of breath or wheezing. Place albuterol inhaler beside bed to use at night as needed for shortness of  breath.  Remember to rinse mouth after using Breo to prevent infection / thrush.   Migraine Pharmacist Clinical Goal(s) Over the next 90 days, patient will work with PharmD and providers to  reduce symptoms of migraine Current regimen:  Rizatriptan 10mg  as needed Excedrin migraine as needed Interventions: none Patient self care activities - Over the next 90 days, patient will: Maintain migraine medication regimen  Hypothyroidism Pharmacist Clinical Goal(s) Over the next 90 days, patient will work with PharmD and providers to reduce symptoms of hypothyroidism Current regimen:  Levothyroxine 47mcg daily Interventions: Recommended continue current dose of levothyroxine Patient self care activities - Over the next 90 days, patient will: Maintain thyroid medication regimen   Acid Reflux / GERD / abdominal pain:  Pharmacist Clinical Goal(s) Over the next 90 days, patient will work with PharmD and providers to reduce symptoms of acid reflux Current regimen:  Pantoprazole 40mg  daily (had been on hold while taking Calquence) Hyoscamine 0.125mg  - take as needed for stomach cramping Interventions: Ok to continue pantoprazole while Calquence is on hold but will need alternative for reflux if Calquence is restarted.  Patient self care activities - Over the next 90 days, patient will: Maintain current reflux regimen unless Calquence is restarted Make sure oncology team is aware you have restarted pantoprazole.  Use Good Rx card with next refill of hyoscyamine (cost for #30 tablets should be around $8)  Bipolar / Mood: Pharmacist Clinical Goal(s) Over the next 90 days, patient will work with PharmD and providers to maintain contact with mental health providers and maintain control of mood Current regimen:  Over the Counter lithium once daily Counseling Interventions: Endorsed continued follow up with counselors and spiritual care Patient self care activities - Over the next 90 days, patient will: Continue to meet with counselors with [employer] [facility] Continue to meet  with chaplain with [facility]   Medication  management Pharmacist Clinical Goal(s): Over the next 90 days, patient will work with PharmD and providers to maintain optimal medication adherence Current pharmacy: [facility] Interventions Comprehensive medication review performed. Continue current medication management strategy Patient self care activities - Over the next 90 days, patient will: Focus on medication adherence by filling and taking medications appropriately  Take medications as prescribed Report any questions or concerns to PharmD and/or provider(s)   Patient Goals/Self-Care Activities Over the next 90 days, patient will:  take medications as prescribed target a minimum of 150 minutes of moderate intensity exercise weekly Use Good Rx card with next refill of hyoscyamine  Patient verbalizes understanding of instructions and care plan provided today and agrees to view in [place]. Active MyChart status confirmed with patient.

## 2021-08-25 ENCOUNTER — Encounter: Payer: Self-pay | Admitting: General Practice

## 2021-08-25 ENCOUNTER — Telehealth: Payer: Self-pay | Admitting: Family Medicine

## 2021-08-25 NOTE — Telephone Encounter (Signed)
Called and spoke w/ pt. Migraine started last Thursday. Woke up with headache. Continued to have pounding sensation on left side of head x2 days. Changes of position caused this to be worse. Even with sitting. Pain now located all over head, per pt.  Rizatriptan, Excedrin/tylenol ineffective . Unable to chew, causes increased pain.   She feels slightly sick.  Had phone interview this past Thursday, was on phone. Had a lot of mucus in throat. Has cleared up some. No fever. Woke up this morning w/ stuffy nose. Lymph nodes do feel swollen. Feels more achy today. Walked over a mile and a half today though. She is making sure to drink plenty of fluids. Face hurts in jaw area. Having numb. This is not new for her. Advised I will discuss w/ AL,NP and call her back w/ recommendation.  Per AL,NP, she would like her to try the following: Benadryl 50mg , rizatriptan, tylenol 1000mg  all together.  She should do covid/flu test to rule this out. If she continues to get more sick, she should f/u w/ PCP. If migraine does not improve and unrelated to sickness, she should f/u with our office to make an appointment for further evaluation. She verbalized understanding.

## 2021-08-25 NOTE — Progress Notes (Signed)
[place] Note  Received email and voicemail from [place] seeking sounding board for discernment about which provider(s) to contact regarding unresolving headache symptoms. She settled on medical oncology and neurology, becoming clear on the wording she wants to use to describe her experience. Being able to process this aloud helped reduce her distress and increase her clarity. We have a scheduled Spiritual Care visit (virtual or in person TBD) on Thursday for spiritual and emotional support.   [place], [place], [facility] Pager [phone] Voicemail [phone]

## 2021-08-25 NOTE — Telephone Encounter (Signed)
Pt would like a call from the nurse to discuss having migraine, but rizatriptan (MAXALT-MLT) 10 MG disintegrating tablet is working. Also took Tylenol, Extra Strength Excedrin.

## 2021-08-27 ENCOUNTER — Ambulatory Visit (INDEPENDENT_AMBULATORY_CARE_PROVIDER_SITE_OTHER): Payer: Medicare Other | Admitting: Family Medicine

## 2021-08-27 ENCOUNTER — Encounter: Payer: Self-pay | Admitting: Family Medicine

## 2021-08-27 VITALS — BP 122/70 | HR 91 | Temp 97.6°F | Resp 18 | Ht 63.0 in | Wt 197.2 lb

## 2021-08-27 DIAGNOSIS — R52 Pain, unspecified: Secondary | ICD-10-CM

## 2021-08-27 DIAGNOSIS — J019 Acute sinusitis, unspecified: Secondary | ICD-10-CM

## 2021-08-27 DIAGNOSIS — J029 Acute pharyngitis, unspecified: Secondary | ICD-10-CM | POA: Diagnosis not present

## 2021-08-27 DIAGNOSIS — J0110 Acute frontal sinusitis, unspecified: Secondary | ICD-10-CM | POA: Diagnosis not present

## 2021-08-27 LAB — COMPREHENSIVE METABOLIC PANEL
ALT: 17 U/L (ref 0–35)
AST: 30 U/L (ref 0–37)
Albumin: 4.1 g/dL (ref 3.5–5.2)
Alkaline Phosphatase: 58 U/L (ref 39–117)
BUN: 13 mg/dL (ref 6–23)
CO2: 29 mEq/L (ref 19–32)
Calcium: 9.6 mg/dL (ref 8.4–10.5)
Chloride: 104 mEq/L (ref 96–112)
Creatinine, Ser: 1.07 mg/dL (ref 0.40–1.20)
GFR: 58 mL/min — ABNORMAL LOW (ref >60.00)
Glucose, Bld: 95 mg/dL (ref 70–99)
Potassium: 4.1 mEq/L (ref 3.5–5.1)
Sodium: 139 mEq/L (ref 135–145)
Total Bilirubin: 0.4 mg/dL (ref 0.2–1.2)
Total Protein: 7.6 g/dL (ref 6.0–8.3)

## 2021-08-27 LAB — CBC WITH DIFFERENTIAL/PLATELET
Basophils Absolute: 0 10*3/uL (ref 0.0–0.1)
Basophils Relative: 0.4 % (ref 0.0–3.0)
Eosinophils Absolute: 0.2 10*3/uL (ref 0.0–0.7)
Eosinophils Relative: 2 % (ref 0.0–5.0)
HCT: 40 % (ref 36.0–46.0)
Hemoglobin: 13.3 g/dL (ref 12.0–15.0)
Lymphocytes Relative: 61.6 % — ABNORMAL HIGH (ref 12.0–46.0)
Lymphs Abs: 6.2 10*3/uL — ABNORMAL HIGH (ref 0.7–4.0)
MCHC: 33.3 g/dL (ref 30.0–36.0)
MCV: 82.2 fl (ref 78.0–100.0)
Monocytes Absolute: 0.9 10*3/uL (ref 0.1–1.0)
Monocytes Relative: 9.3 % (ref 3.0–12.0)
Neutro Abs: 2.7 10*3/uL (ref 1.4–7.7)
Neutrophils Relative %: 26.7 % — ABNORMAL LOW (ref 43.0–77.0)
Platelets: 180 10*3/uL (ref 150.0–400.0)
RBC: 4.87 Mil/uL (ref 3.87–5.11)
RDW: 13.7 % (ref 11.5–15.5)
WBC: 10.1 10*3/uL (ref 4.0–10.5)

## 2021-08-27 LAB — POCT INFLUENZA A/B
Influenza A, POC: NEGATIVE
Influenza B, POC: NEGATIVE

## 2021-08-27 LAB — POCT RAPID STREP A (OFFICE): Rapid Strep A Screen: NEGATIVE

## 2021-08-27 MED ORDER — CLINDAMYCIN HCL 300 MG PO CAPS: 300.0000 mg | ORAL_CAPSULE | Freq: Three times a day (TID) | ORAL | 0 refills | Status: AC

## 2021-08-27 NOTE — Progress Notes (Signed)
[facility] [address], [address], [place] [address] [phone]  Date:  08/27/2021   Name:  Morgan [name]   DOB:  1966-06-02   MRN:  [id number]  PCP:  Darreld Mclean, MD    Chief Complaint: URI (swollen tonsils, headache, body aches, facial pain, fatigue since 02/01- negative covid test 08/25/21)   History of Present Illness:  Morgan [name] is a 56 y.o. very pleasant female patient who presents with the following:  Pt seen today with concern of illness Last seen by myself 1/12 History of CLL, hypothyroidism, mood disorder She is also recently struggled with various rashes, itching  She notes sx of illness which started one week ago She awoke last Thursday with migraine HA (today is Wednesday) The HA contained for several days - she did a migraine cocktail on Monday night as per neurology rec, this did help  Yesterday she felt somewhat better, she was having freuquent stools but not necessarily diarrhea Today she feels achy and "super mucus-y which is making me feel nauseous" Her throat is sore and looks red in the mirror  She took a covid test on Monday and it was negative   Her temps are normal No vomiting She has a runny nose, some cough but not severe. She also noted sinus pressure and tooth pain which makes her think of a sinus infection  Recent CBC with diff per [facility] 1/23 basically normal- neurophil count ok at 2.6 Patient Active Problem List   Diagnosis Date Noted   Throat discomfort 03/12/2020   Hypothyroidism 11/18/2018   Fever 06/18/2018   Piriformis syndrome of right side 04/22/2017   Lateral epicondylitis of left elbow 04/06/2017   Solitary pulmonary nodule 10/29/2016   Chronic lymphocytic leukemia ([place]) 07/31/2016   Mass in neck 04/17/2016   Abdominal mass 04/17/2016   Asthma 12/12/2015   Multiple environmental allergies 12/12/2015   Chronic lower back pain 09/12/2015   Bipolar 1 disorder,  depressed ([place]) 08/27/2015    Class: Chronic   Borderline personality disorder ([place]) 08/15/2015   Severe benzodiazepine use disorder ([place]) 08/15/2015   Alcohol use disorder, moderate, dependence ([place]) 08/15/2015   PTSD (post-traumatic stress disorder) 08/15/2015   History of bipolar disorder 08/15/2015   Pre-syncope 05/17/2015   Skull deformity 05/17/2015   Patellofemoral syndrome of both knees 05/02/2015    Past Medical History:  Diagnosis Date   Anxiety    Arthritis    Asthma    Bipolar disorder ([place])    Bulging lumbar disc 07/19/05   Chronic headaches    Chronic lymphocytic leukemia ([place]) 07/31/2016   Colon polyps    Degenerative disorder of bone    Depression    History of borderline personality disorder    Hypothyroidism 2007   Developed after use of Lithium   IBS (irritable bowel syndrome) 1995   Pneumonia    Proctitis    Shingles 07/2018   Substance abuse ([place])    ativan last month    Past Surgical History:  Procedure Laterality Date   ABDOMINAL HYSTERECTOMY     APPENDECTOMY     BUNIONECTOMY Right 06/2012   CHOLECYSTECTOMY     OOPHORECTOMY     SHOULDER OPEN ROTATOR CUFF REPAIR Right 03/2010   TUBAL LIGATION      Social History   Tobacco Use   Smoking status: Former    Types: Cigarettes    Quit date: 07/20/1980    Years since  quitting: 41.1   Smokeless tobacco: Never   Tobacco comments:    Also exposed to second hand smoke as a child  Vaping Use   Vaping Use: Never used  Substance Use Topics   Alcohol use: Yes    Alcohol/week: 0.0 standard drinks    Comment: occ   Drug use: Not Currently    Family History  Problem Relation Age of Onset   Depression Mother    Hypertension Mother    Thyroid disease Father    Alzheimer's disease Father    Post-traumatic stress disorder Sister    Alcohol abuse Brother    Drug abuse Brother    Post-traumatic stress disorder Brother    COPD Maternal Grandmother    Heart disease Maternal Grandfather     Arthritis Paternal Grandmother    Diabetes Paternal Grandmother    Depression Paternal Grandmother    Alzheimer's disease Paternal Grandmother    Heart disease Paternal Grandfather    Coronary artery disease Paternal Grandfather    Alcohol abuse Paternal Grandfather    Sleep apnea Cousin    Diabetes Maternal Uncle    Colon cancer Neg Hx    Breast cancer Neg Hx     Allergies  Allergen Reactions   Aripiprazole Anaphylaxis and Swelling   Ciprofloxacin Shortness Of Breath   Lamotrigine Rash and Other (See Comments)   Nitrofurantoin Hives   Other Anaphylaxis, Hives and Swelling    Walnuts and pine nuts   Peanut Oil Anaphylaxis, Hives, Swelling and Rash   Amoxicillin Hives and Rash    Update - pt states she has been tested by her allergist and is no longer allergic to penicillin 03/2021   Doxycycline Hives and Rash   Latex Rash and Other (See Comments)   Meloxicam Itching and Other (See Comments)    Induces mania Interacts with Lithium   Naproxen Hives and Other (See Comments)    Interacts with lithium   Pramipexole Hives and Rash   Prednisone Other (See Comments)    Interacts with Lithium   Risperidone Hives and Rash   Sulfa Antibiotics Rash    "that leaves scarring"   Tape Itching and Rash    Steri-Strips   Cortisone Other (See Comments)    Exacerbates the mania of her bipolar   Keflex [Cephalexin]     Pt developed a rash after taking high dose keflex for several days    Percocet [Oxycodone-Acetaminophen] Other (See Comments)    Severe dizziness   Shingrix [Zoster Vac Recomb Adjuvanted] Swelling    rash   Vancomycin Itching    Itching on head and at IV insertion site where Vanc running   Cuvitru [Immune Globulin (Human)] Rash    Also allergic to Hizentra    Medication list has been reviewed and updated.  Current Outpatient Medications on File Prior to Visit  Medication Sig Dispense Refill   acetaminophen (TYLENOL) 500 MG tablet Take 1,000 mg by mouth every 6 (six)  hours as needed for mild pain.     albuterol (VENTOLIN HFA) 108 (90 Base) MCG/ACT inhaler Inhale 1-2 puffs into the lungs every 6 (six) hours as needed for wheezing or shortness of breath. 18 g 6   aspirin-acetaminophen-caffeine (EXCEDRIN EXTRA STRENGTH) 250-250-65 MG tablet Take 2 tablets by mouth every 6 (six) hours as needed for headache.     Cholecalciferol 125 MCG (5000 UT) TABS Take 7,500 Units by mouth every other day.     diphenhydrAMINE (BENADRYL) 25 MG tablet Take 1 tablet (25  mg total) by mouth every 6 (six) hours as needed for itching. (Patient taking differently: Take 25 mg by mouth every 6 (six) hours as needed for itching. Uses as premed prior to IVIG) 20 tablet 0   EPINEPHrine 0.3 mg/0.3 mL IJ SOAJ injection Inject 0.3 mLs (0.3 mg total) into the muscle as needed for anaphylaxis. 1 each prn   ferrous sulfate 325 (65 FE) MG tablet Take 325 mg by mouth daily with breakfast. Take vitamin C along with ferrous sulfated     fexofenadine (ALLEGRA) 180 MG tablet Take 180 mg by mouth daily.     fluticasone furoate-vilanterol (BREO ELLIPTA) 100-25 MCG/INH AEPB Inhale 1 puff into the lungs daily. 1 each 11   folic acid (FOLVITE) 1 MG tablet Take 1 mg by mouth daily.     hyoscyamine (LEVSIN) 0.125 MG tablet Take 1 tablet (0.125 mg total) by mouth as needed for cramping. Reported on 09/24/2015 10 tablet 2   Immune Globulin 10% (GAMUNEX-C) 10 GM/100ML SOLN Inject into the vein. Every 3 weeks     levothyroxine (SYNTHROID) 50 MCG tablet Take 1 tablet (50 mcg total) by mouth daily before breakfast. 90 tablet 1   lidocaine-prilocaine (EMLA) cream Apply 1 application topically as needed.     ondansetron (ZOFRAN) 8 MG tablet Take by mouth every 8 (eight) hours as needed for nausea or vomiting.     pantoprazole (PROTONIX) 40 MG tablet Take 40 mg by mouth daily.     rizatriptan (MAXALT-MLT) 10 MG disintegrating tablet Take 1 tablet (10 mg total) by mouth as needed for migraine. May repeat in 2 hours if  needed 9 tablet 11   senna-docusate (SENOKOT-S) 8.6-50 MG tablet Take 2 tablets by mouth daily as needed for mild constipation.     triamcinolone cream (KENALOG) 0.1 % Apply topically 2 (two) times daily.     UNABLE TO FIND Take 2.4 mg by mouth 2 (two) times daily. Med Name: Lithium OTC     No current facility-administered medications on file prior to visit.    Review of Systems:  As per HPI- otherwise negative.   Physical Examination: Vitals:   08/27/21 1427  BP: 122/70  Pulse: 91  Resp: 18  Temp: 97.6 F (36.4 C)  SpO2: 98%   Vitals:   08/27/21 1427  Weight: 197 lb 3.2 oz (89.4 kg)  Height: 5\' 3"  (1.6 m)   Body mass index is 34.93 kg/m. Ideal Body Weight: Weight in (lb) to have BMI = 25: 140.8  GEN: no acute distress.  Obese, looks well and her normal self  HEENT: Atraumatic, Normocephalic.  Bilateral TM wnl, oropharynx erythematous without exudate.  PEERL,EOMI.   Ears and Nose: No external deformity. CV: RRR, No M/G/R. No JVD. No thrill. No extra heart sounds. PULM: CTA B, no wheezes, crackles, rhonchi. No retractions. No resp. distress. No accessory muscle use. ABD: S, NT, ND EXTR: No c/c/e PSYCH: Normally interactive. Conversant.   Results for orders placed or performed in visit on 08/27/21  CBC with Differential/Platelet  Result Value Ref Range   WBC 10.1 4.0 - 10.5 K/uL   RBC 4.87 3.87 - 5.11 Mil/uL   Hemoglobin 13.3 12.0 - 15.0 g/dL   HCT 40.0 36.0 - 46.0 %   MCV 82.2 78.0 - 100.0 fl   MCHC 33.3 30.0 - 36.0 g/dL   RDW 13.7 11.5 - 15.5 %   Platelets 180.0 150.0 - 400.0 K/uL   Neutrophils Relative % 26.7 (L) 43.0 - 77.0 %  Lymphocytes Relative 61.6 Repeated and verified X2. (H) 12.0 - 46.0 %   Monocytes Relative 9.3 3.0 - 12.0 %   Eosinophils Relative 2.0 0.0 - 5.0 %   Basophils Relative 0.4 0.0 - 3.0 %   Neutro Abs 2.7 1.4 - 7.7 K/uL   Lymphs Abs 6.2 (H) 0.7 - 4.0 K/uL   Monocytes Absolute 0.9 0.1 - 1.0 K/uL   Eosinophils Absolute 0.2 0.0 - 0.7 K/uL    Basophils Absolute 0.0 0.0 - 0.1 K/uL  POCT Influenza A/B  Result Value Ref Range   Influenza A, POC Negative Negative   Influenza B, POC Negative Negative  POCT rapid strep A  Result Value Ref Range   Rapid Strep A Screen Negative Negative    Assessment and Plan: Body aches - Plan: POCT Influenza A/B, CBC with Differential/Platelet, Comprehensive metabolic panel  Pharyngitis, unspecified etiology - Plan: POCT rapid strep A, CBC with Differential/Platelet  Acute non-recurrent frontal sinusitis  Acute non-recurrent sinusitis, unspecified location - Plan: clindamycin (CLEOCIN) 300 MG capsule Pt seen today for illness Negative for covid at home, neg flu and strep here Pt is concerned about infection risk with her history of CLL, would like to use abx for possible sinusitis Options are limited due to multiple drug allergies We settled on clindamycin 300 TID  Pt is also concerned that her spleen is sore, she was told that her spleen was large on last CT scan.  I offered to repeat CT - she wants to touch base with her oncology team first and will let me know if she wants to pursue a scan  Received her labs, message to pt    Signed Lamar Blinks, MD

## 2021-08-27 NOTE — Patient Instructions (Signed)
If you start to run a fever please alert  I will be in touch with your labs asap We will use clindamycin for possible sinus infection Use a probiotic while you are on this medication  Please let me know if you are not getting better I can do a CT to look at your spleen if you like at your convenience

## 2021-08-29 DIAGNOSIS — C9110 Chronic lymphocytic leukemia of B-cell type not having achieved remission: Secondary | ICD-10-CM | POA: Diagnosis not present

## 2021-08-29 DIAGNOSIS — I888 Other nonspecific lymphadenitis: Secondary | ICD-10-CM | POA: Diagnosis not present

## 2021-08-29 DIAGNOSIS — D801 Nonfamilial hypogammaglobulinemia: Secondary | ICD-10-CM | POA: Diagnosis not present

## 2021-08-29 DIAGNOSIS — D849 Immunodeficiency, unspecified: Secondary | ICD-10-CM | POA: Diagnosis not present

## 2021-09-04 ENCOUNTER — Encounter: Payer: Self-pay | Admitting: General Practice

## 2021-09-04 NOTE — Progress Notes (Signed)
[place] Spiritual Care Note  Follow Morgan [name] through Blood Cancer Support Group. Met with her in my office today for opportunity to process her spiritual responses to life stressors, including health changes and questions about treatment path and implications. Provided empathic listening, emotional support, normalization of feelings, and pastoral reflection. We plan to have a brief Webex check-in next week for follow-up support and spiritual encouragement.   [place], [place], [facility] Pager [phone] Voicemail [phone]

## 2021-09-08 ENCOUNTER — Telehealth: Payer: Self-pay | Admitting: Family Medicine

## 2021-09-08 DIAGNOSIS — E038 Other specified hypothyroidism: Secondary | ICD-10-CM | POA: Diagnosis not present

## 2021-09-08 DIAGNOSIS — Z713 Dietary counseling and surveillance: Secondary | ICD-10-CM | POA: Diagnosis not present

## 2021-09-08 NOTE — Telephone Encounter (Signed)
Pt stated that she's experiencing the same symps from prevs visit. When taking antibiotics everything was okay, now everything is back to how it was before the antibiotics. Pt states her throat hurts, chewing hurts, runny nose, body ache and swollen face.   Please advise.

## 2021-09-08 NOTE — Telephone Encounter (Signed)
Tough to say without examining her. Could wait it out a few more days or schedule appt. Ty.

## 2021-09-08 NOTE — Telephone Encounter (Signed)
Pt was seen on 08/27/21   Plan read as follows:  "Acute non-recurrent sinusitis, unspecified location - Plan: clindamycin (CLEOCIN) 300 MG capsule Pt seen today for illness Negative for covid at home, neg flu and strep here Pt is concerned about infection risk with her history of CLL, would like to use abx for possible sinusitis Options are limited due to multiple drug allergies We settled on clindamycin 300 TID."  Please advise.

## 2021-09-09 ENCOUNTER — Encounter: Payer: Self-pay | Admitting: General Practice

## 2021-09-09 DIAGNOSIS — D801 Nonfamilial hypogammaglobulinemia: Secondary | ICD-10-CM | POA: Diagnosis not present

## 2021-09-09 NOTE — Progress Notes (Signed)
[place] Spiritual Care Note  Followed up with Sydny via Spiritual Care Virtual Visit on webex. She is experiencing more health symptoms, including many swollen lymph nodes, and is working to apprise her treatment team of them. She is also working toward a meaningful spiritual ritual that is bringing meaning-making and connection in the midst of other distress. We plan to visit in the garden next week.   [place], [place], [facility] Pager [phone] Voicemail [phone]

## 2021-09-10 DIAGNOSIS — E079 Disorder of thyroid, unspecified: Secondary | ICD-10-CM | POA: Diagnosis not present

## 2021-09-10 DIAGNOSIS — K2100 Gastro-esophageal reflux disease with esophagitis, without bleeding: Secondary | ICD-10-CM | POA: Diagnosis not present

## 2021-09-10 DIAGNOSIS — Z4509 Encounter for adjustment and management of other cardiac device: Secondary | ICD-10-CM | POA: Diagnosis not present

## 2021-09-10 DIAGNOSIS — Z87891 Personal history of nicotine dependence: Secondary | ICD-10-CM | POA: Diagnosis not present

## 2021-09-10 DIAGNOSIS — Z889 Allergy status to unspecified drugs, medicaments and biological substances status: Secondary | ICD-10-CM | POA: Diagnosis not present

## 2021-09-10 DIAGNOSIS — C9110 Chronic lymphocytic leukemia of B-cell type not having achieved remission: Secondary | ICD-10-CM | POA: Diagnosis not present

## 2021-09-10 DIAGNOSIS — J4530 Mild persistent asthma, uncomplicated: Secondary | ICD-10-CM | POA: Diagnosis not present

## 2021-09-10 DIAGNOSIS — K582 Mixed irritable bowel syndrome: Secondary | ICD-10-CM | POA: Diagnosis not present

## 2021-09-10 DIAGNOSIS — R590 Localized enlarged lymph nodes: Secondary | ICD-10-CM | POA: Diagnosis not present

## 2021-09-10 DIAGNOSIS — I493 Ventricular premature depolarization: Secondary | ICD-10-CM | POA: Diagnosis not present

## 2021-09-10 DIAGNOSIS — Z959 Presence of cardiac and vascular implant and graft, unspecified: Secondary | ICD-10-CM | POA: Diagnosis not present

## 2021-09-10 DIAGNOSIS — Z79899 Other long term (current) drug therapy: Secondary | ICD-10-CM | POA: Diagnosis not present

## 2021-09-10 DIAGNOSIS — Z01818 Encounter for other preprocedural examination: Secondary | ICD-10-CM | POA: Diagnosis not present

## 2021-09-10 DIAGNOSIS — R002 Palpitations: Secondary | ICD-10-CM | POA: Diagnosis not present

## 2021-09-10 DIAGNOSIS — D801 Nonfamilial hypogammaglobulinemia: Secondary | ICD-10-CM | POA: Diagnosis not present

## 2021-09-10 DIAGNOSIS — E039 Hypothyroidism, unspecified: Secondary | ICD-10-CM | POA: Diagnosis not present

## 2021-09-12 NOTE — Telephone Encounter (Signed)
Called pt for an update- she says she is stable with her sxs. I let her know that you were out earlier in the week and she had a lot going on at the beginning of the week as well- so she would not be able to come in for an appointment anyway.   She has started using her Saline nasal spray BID and taking Allegra daily and that has seemed to have helped some.   Other updates: She still has some abd pain and lymph node pain- scheduled for surgery this coming Tuesday and had a reaction to her last infusion earlier in the week.   She will call if her URI sxs change or worsen.

## 2021-09-16 ENCOUNTER — Encounter: Payer: Self-pay | Admitting: General Practice

## 2021-09-16 DIAGNOSIS — K219 Gastro-esophageal reflux disease without esophagitis: Secondary | ICD-10-CM | POA: Diagnosis not present

## 2021-09-16 DIAGNOSIS — Z79899 Other long term (current) drug therapy: Secondary | ICD-10-CM | POA: Diagnosis not present

## 2021-09-16 DIAGNOSIS — F1090 Alcohol use, unspecified, uncomplicated: Secondary | ICD-10-CM | POA: Diagnosis not present

## 2021-09-16 DIAGNOSIS — M19071 Primary osteoarthritis, right ankle and foot: Secondary | ICD-10-CM | POA: Diagnosis not present

## 2021-09-16 DIAGNOSIS — J45909 Unspecified asthma, uncomplicated: Secondary | ICD-10-CM | POA: Diagnosis not present

## 2021-09-16 DIAGNOSIS — Z881 Allergy status to other antibiotic agents status: Secondary | ICD-10-CM | POA: Diagnosis not present

## 2021-09-16 DIAGNOSIS — M19049 Primary osteoarthritis, unspecified hand: Secondary | ICD-10-CM | POA: Diagnosis not present

## 2021-09-16 DIAGNOSIS — M19072 Primary osteoarthritis, left ankle and foot: Secondary | ICD-10-CM | POA: Diagnosis not present

## 2021-09-16 DIAGNOSIS — B029 Zoster without complications: Secondary | ICD-10-CM | POA: Diagnosis not present

## 2021-09-16 DIAGNOSIS — Z888 Allergy status to other drugs, medicaments and biological substances status: Secondary | ICD-10-CM | POA: Diagnosis not present

## 2021-09-16 DIAGNOSIS — C9112 Chronic lymphocytic leukemia of B-cell type in relapse: Secondary | ICD-10-CM | POA: Diagnosis not present

## 2021-09-16 DIAGNOSIS — Z95828 Presence of other vascular implants and grafts: Secondary | ICD-10-CM | POA: Diagnosis not present

## 2021-09-16 DIAGNOSIS — C9110 Chronic lymphocytic leukemia of B-cell type not having achieved remission: Secondary | ICD-10-CM | POA: Diagnosis not present

## 2021-09-16 DIAGNOSIS — E039 Hypothyroidism, unspecified: Secondary | ICD-10-CM | POA: Diagnosis not present

## 2021-09-16 DIAGNOSIS — Z7951 Long term (current) use of inhaled steroids: Secondary | ICD-10-CM | POA: Diagnosis not present

## 2021-09-16 DIAGNOSIS — Z880 Allergy status to penicillin: Secondary | ICD-10-CM | POA: Diagnosis not present

## 2021-09-16 DIAGNOSIS — Z882 Allergy status to sulfonamides status: Secondary | ICD-10-CM | POA: Diagnosis not present

## 2021-09-16 DIAGNOSIS — Z87891 Personal history of nicotine dependence: Secondary | ICD-10-CM | POA: Diagnosis not present

## 2021-09-16 DIAGNOSIS — K589 Irritable bowel syndrome without diarrhea: Secondary | ICD-10-CM | POA: Diagnosis not present

## 2021-09-16 NOTE — Progress Notes (Signed)
[place] Spiritual Care Note  Denya needed to postpone garden visit due to biopsy schedule today. She phoned to request prayer re health symptoms, including significant nausea, and distress related to her health and living situations. Provided empathic listening, pastoral reflection, emotional support, and prayer. She plans to follow up with updates as needed.   [place], [place], [facility] Pager [phone] Voicemail [phone]

## 2021-09-19 DIAGNOSIS — C9110 Chronic lymphocytic leukemia of B-cell type not having achieved remission: Secondary | ICD-10-CM | POA: Diagnosis not present

## 2021-09-19 DIAGNOSIS — R591 Generalized enlarged lymph nodes: Secondary | ICD-10-CM | POA: Diagnosis not present

## 2021-09-22 DIAGNOSIS — Z713 Dietary counseling and surveillance: Secondary | ICD-10-CM | POA: Diagnosis not present

## 2021-09-24 DIAGNOSIS — C9110 Chronic lymphocytic leukemia of B-cell type not having achieved remission: Secondary | ICD-10-CM | POA: Diagnosis not present

## 2021-09-24 DIAGNOSIS — R591 Generalized enlarged lymph nodes: Secondary | ICD-10-CM | POA: Diagnosis not present

## 2021-09-26 DIAGNOSIS — D839 Common variable immunodeficiency, unspecified: Secondary | ICD-10-CM | POA: Diagnosis not present

## 2021-10-01 DIAGNOSIS — D801 Nonfamilial hypogammaglobulinemia: Secondary | ICD-10-CM | POA: Diagnosis not present

## 2021-10-03 ENCOUNTER — Encounter: Payer: Self-pay | Admitting: General Practice

## 2021-10-03 NOTE — Progress Notes (Signed)
[place] Spiritual Care Note ? ?Met with Morgan [name] in my office to serve as a sounding board for assistance with discernment/processing about her treatment plan and how to share her medical updates with her family. Provided reflective listening and emotional support. Set up follow-up meeting for next week to process her experience of talking with her family and her oncologist. ? ? ?Chaplain [name], MDiv, [facility] ?Pager [phone] ?Voicemail [phone]  ?

## 2021-10-06 DIAGNOSIS — Z713 Dietary counseling and surveillance: Secondary | ICD-10-CM | POA: Diagnosis not present

## 2021-10-07 DIAGNOSIS — R42 Dizziness and giddiness: Secondary | ICD-10-CM | POA: Diagnosis not present

## 2021-10-07 DIAGNOSIS — C9110 Chronic lymphocytic leukemia of B-cell type not having achieved remission: Secondary | ICD-10-CM | POA: Diagnosis not present

## 2021-10-07 DIAGNOSIS — Z79899 Other long term (current) drug therapy: Secondary | ICD-10-CM | POA: Diagnosis not present

## 2021-10-07 DIAGNOSIS — R7989 Other specified abnormal findings of blood chemistry: Secondary | ICD-10-CM | POA: Diagnosis not present

## 2021-10-07 DIAGNOSIS — I493 Ventricular premature depolarization: Secondary | ICD-10-CM | POA: Diagnosis not present

## 2021-10-07 DIAGNOSIS — D801 Nonfamilial hypogammaglobulinemia: Secondary | ICD-10-CM | POA: Diagnosis not present

## 2021-10-07 DIAGNOSIS — D72820 Lymphocytosis (symptomatic): Secondary | ICD-10-CM | POA: Diagnosis not present

## 2021-10-07 DIAGNOSIS — Z7951 Long term (current) use of inhaled steroids: Secondary | ICD-10-CM | POA: Diagnosis not present

## 2021-10-07 DIAGNOSIS — C8588 Other specified types of non-Hodgkin lymphoma, lymph nodes of multiple sites: Secondary | ICD-10-CM | POA: Diagnosis not present

## 2021-10-07 DIAGNOSIS — K589 Irritable bowel syndrome without diarrhea: Secondary | ICD-10-CM | POA: Diagnosis not present

## 2021-10-07 DIAGNOSIS — R3129 Other microscopic hematuria: Secondary | ICD-10-CM | POA: Diagnosis not present

## 2021-10-07 DIAGNOSIS — J45909 Unspecified asthma, uncomplicated: Secondary | ICD-10-CM | POA: Diagnosis not present

## 2021-10-07 DIAGNOSIS — R918 Other nonspecific abnormal finding of lung field: Secondary | ICD-10-CM | POA: Diagnosis not present

## 2021-10-07 DIAGNOSIS — Z87891 Personal history of nicotine dependence: Secondary | ICD-10-CM | POA: Diagnosis not present

## 2021-10-07 DIAGNOSIS — E039 Hypothyroidism, unspecified: Secondary | ICD-10-CM | POA: Diagnosis not present

## 2021-10-07 DIAGNOSIS — R002 Palpitations: Secondary | ICD-10-CM | POA: Diagnosis not present

## 2021-10-07 DIAGNOSIS — R0789 Other chest pain: Secondary | ICD-10-CM | POA: Diagnosis not present

## 2021-10-07 DIAGNOSIS — R1084 Generalized abdominal pain: Secondary | ICD-10-CM | POA: Diagnosis not present

## 2021-10-07 DIAGNOSIS — R911 Solitary pulmonary nodule: Secondary | ICD-10-CM | POA: Diagnosis not present

## 2021-10-08 ENCOUNTER — Telehealth: Payer: Self-pay | Admitting: Family Medicine

## 2021-10-08 DIAGNOSIS — Z5181 Encounter for therapeutic drug level monitoring: Secondary | ICD-10-CM

## 2021-10-08 NOTE — Telephone Encounter (Signed)
Pt states her hematologist Dr. Dorothea Ogle, has advised she should get a comprehensive metabolic panel done at our office. She will be providing fax number at a later date so those results can be sent. Please advise.  ?

## 2021-10-09 ENCOUNTER — Encounter: Payer: Self-pay | Admitting: Family Medicine

## 2021-10-09 ENCOUNTER — Encounter: Payer: Self-pay | Admitting: General Practice

## 2021-10-09 NOTE — Progress Notes (Signed)
[place] Spiritual Care Note ? ?Follow [place] through Blood Cancer Support Group. Met in my office for her to share and process updates related to treatment plan and upcoming move, two significant stressors. Looking forward to traveling at some point in the future is giving her something joyful to focus on, and she reports good support from her kids, therapist, [facility], and some friends.  ? ?Provided empathic listening and spiritual/emotional support. ? ?We plan to check in by phone next week for encouragement during the move and to meet the first week in April for updated reflections. ? ? ?Chaplain [name], MDiv, [employer] ?Pager [phone] ?Voicemail [phone]  ?

## 2021-10-09 NOTE — Telephone Encounter (Signed)
Looks like pt had a CMP drawn a couple days ago by another provider. Please advise.  ?

## 2021-10-13 ENCOUNTER — Other Ambulatory Visit: Payer: Self-pay | Admitting: Family Medicine

## 2021-10-13 DIAGNOSIS — E039 Hypothyroidism, unspecified: Secondary | ICD-10-CM

## 2021-10-14 ENCOUNTER — Telehealth: Payer: Self-pay | Admitting: Family Medicine

## 2021-10-14 ENCOUNTER — Encounter: Payer: Self-pay | Admitting: General Practice

## 2021-10-14 ENCOUNTER — Other Ambulatory Visit (INDEPENDENT_AMBULATORY_CARE_PROVIDER_SITE_OTHER): Payer: Medicare Other

## 2021-10-14 ENCOUNTER — Encounter: Payer: Self-pay | Admitting: Family Medicine

## 2021-10-14 DIAGNOSIS — Z5181 Encounter for therapeutic drug level monitoring: Secondary | ICD-10-CM

## 2021-10-14 LAB — COMPREHENSIVE METABOLIC PANEL
ALT: 17 U/L (ref 0–35)
AST: 32 U/L (ref 0–37)
Albumin: 4.6 g/dL (ref 3.5–5.2)
Alkaline Phosphatase: 65 U/L (ref 39–117)
BUN: 13 mg/dL (ref 6–23)
CO2: 25 mEq/L (ref 19–32)
Calcium: 10 mg/dL (ref 8.4–10.5)
Chloride: 103 mEq/L (ref 96–112)
Creatinine, Ser: 1.13 mg/dL (ref 0.40–1.20)
GFR: 54.28 mL/min — ABNORMAL LOW (ref >60.00)
Glucose, Bld: 102 mg/dL — ABNORMAL HIGH (ref 70–99)
Potassium: 4.4 mEq/L (ref 3.5–5.1)
Sodium: 138 mEq/L (ref 135–145)
Total Bilirubin: 0.5 mg/dL (ref 0.2–1.2)
Total Protein: 7.6 g/dL (ref 6.0–8.3)

## 2021-10-14 NOTE — Progress Notes (Signed)
[place] Spiritual Care Note ? ?Made quick phone call of encouragement to [facility] per her request for support as she prepares for her move. Provided empathic listening, emotional support, affirmation of strengths, encouragement, and reading of a meaningful poem. We have a regular appointment scheduled for 4/4. ? ? ?Chaplain [name], MDiv, [facility] ?Pager [phone] ?Voicemail [phone]  ?

## 2021-10-14 NOTE — Telephone Encounter (Signed)
Dr.Brander is requesting labs collected today to be faxed to [phone]. Please advise.  ?

## 2021-10-15 DIAGNOSIS — R109 Unspecified abdominal pain: Secondary | ICD-10-CM | POA: Diagnosis not present

## 2021-10-15 DIAGNOSIS — R590 Localized enlarged lymph nodes: Secondary | ICD-10-CM | POA: Diagnosis not present

## 2021-10-15 DIAGNOSIS — R1084 Generalized abdominal pain: Secondary | ICD-10-CM | POA: Diagnosis not present

## 2021-10-15 DIAGNOSIS — C9110 Chronic lymphocytic leukemia of B-cell type not having achieved remission: Secondary | ICD-10-CM | POA: Diagnosis not present

## 2021-10-16 NOTE — Telephone Encounter (Signed)
Labs results have been faxed.  ?

## 2021-10-17 NOTE — Telephone Encounter (Signed)
error 

## 2021-10-23 DIAGNOSIS — D801 Nonfamilial hypogammaglobulinemia: Secondary | ICD-10-CM | POA: Diagnosis not present

## 2021-10-26 DIAGNOSIS — Z95818 Presence of other cardiac implants and grafts: Secondary | ICD-10-CM | POA: Diagnosis not present

## 2021-11-04 DIAGNOSIS — Z713 Dietary counseling and surveillance: Secondary | ICD-10-CM | POA: Diagnosis not present

## 2021-11-04 DIAGNOSIS — Z9109 Other allergy status, other than to drugs and biological substances: Secondary | ICD-10-CM | POA: Diagnosis not present

## 2021-11-07 DIAGNOSIS — D849 Immunodeficiency, unspecified: Secondary | ICD-10-CM | POA: Diagnosis not present

## 2021-11-07 DIAGNOSIS — R21 Rash and other nonspecific skin eruption: Secondary | ICD-10-CM | POA: Diagnosis not present

## 2021-11-07 DIAGNOSIS — C9110 Chronic lymphocytic leukemia of B-cell type not having achieved remission: Secondary | ICD-10-CM | POA: Diagnosis not present

## 2021-11-07 DIAGNOSIS — K582 Mixed irritable bowel syndrome: Secondary | ICD-10-CM | POA: Diagnosis not present

## 2021-11-07 DIAGNOSIS — D801 Nonfamilial hypogammaglobulinemia: Secondary | ICD-10-CM | POA: Diagnosis not present

## 2021-11-14 DIAGNOSIS — D801 Nonfamilial hypogammaglobulinemia: Secondary | ICD-10-CM | POA: Diagnosis not present

## 2021-11-18 DIAGNOSIS — R002 Palpitations: Secondary | ICD-10-CM | POA: Diagnosis not present

## 2021-11-18 DIAGNOSIS — Z87891 Personal history of nicotine dependence: Secondary | ICD-10-CM | POA: Diagnosis not present

## 2021-11-18 DIAGNOSIS — R590 Localized enlarged lymph nodes: Secondary | ICD-10-CM | POA: Diagnosis not present

## 2021-11-18 DIAGNOSIS — D801 Nonfamilial hypogammaglobulinemia: Secondary | ICD-10-CM | POA: Diagnosis not present

## 2021-11-18 DIAGNOSIS — R42 Dizziness and giddiness: Secondary | ICD-10-CM | POA: Diagnosis not present

## 2021-11-18 DIAGNOSIS — R918 Other nonspecific abnormal finding of lung field: Secondary | ICD-10-CM | POA: Diagnosis not present

## 2021-11-18 DIAGNOSIS — C9110 Chronic lymphocytic leukemia of B-cell type not having achieved remission: Secondary | ICD-10-CM | POA: Diagnosis not present

## 2021-11-18 DIAGNOSIS — R3129 Other microscopic hematuria: Secondary | ICD-10-CM | POA: Diagnosis not present

## 2021-11-18 DIAGNOSIS — I493 Ventricular premature depolarization: Secondary | ICD-10-CM | POA: Diagnosis not present

## 2021-11-18 DIAGNOSIS — J45909 Unspecified asthma, uncomplicated: Secondary | ICD-10-CM | POA: Diagnosis not present

## 2021-11-18 DIAGNOSIS — Z79899 Other long term (current) drug therapy: Secondary | ICD-10-CM | POA: Diagnosis not present

## 2021-11-18 DIAGNOSIS — K580 Irritable bowel syndrome with diarrhea: Secondary | ICD-10-CM | POA: Diagnosis not present

## 2021-11-21 ENCOUNTER — Encounter: Payer: Self-pay | Admitting: General Practice

## 2021-11-21 NOTE — Progress Notes (Signed)
[facility] Spiritual Care Note ? ?Follow Morgan [name] through [facility] and periodic pastoral check-ins. Met in my office today for an opportunity for her to share and process recent appointments, health updates, and ways that she is really leaning into quality of life, especially through a daily writing practice, looking for "consolations" (moments of divine presence, beauty, connection, etc), preparing to start a blog/vlog, and planning trips to places that are very important to her (including her former home area in [place]). Provided empathic listening, emotional support, and pastoral reflection. We plan to set up another appointment next week. ? ? ?Chaplain  , MDiv, [facility] ?Pager [phone] ?Voicemail [phone]  ?

## 2021-11-23 ENCOUNTER — Encounter: Payer: Self-pay | Admitting: Family Medicine

## 2021-11-23 DIAGNOSIS — E039 Hypothyroidism, unspecified: Secondary | ICD-10-CM

## 2021-11-23 DIAGNOSIS — J45909 Unspecified asthma, uncomplicated: Secondary | ICD-10-CM

## 2021-11-23 MED ORDER — LEVOTHYROXINE SODIUM 50 MCG PO TABS: 50.0000 ug | ORAL_TABLET | Freq: Every day | ORAL | 3 refills | Status: DC

## 2021-11-24 MED ORDER — FLUTICASONE FUROATE-VILANTEROL 100-25 MCG/ACT IN AEPB: 1.0000 | INHALATION_SPRAY | Freq: Every day | RESPIRATORY_TRACT | 11 refills | Status: DC

## 2021-11-24 MED ORDER — ALBUTEROL SULFATE HFA 108 (90 BASE) MCG/ACT IN AERS: 1.0000 | INHALATION_SPRAY | Freq: Four times a day (QID) | RESPIRATORY_TRACT | 6 refills | Status: DC | PRN

## 2021-11-24 NOTE — Addendum Note (Signed)
Addended by: Lamar Blinks C on: 11/24/2021 08:15 AM ? ? Modules accepted: Orders ? ?

## 2021-11-25 DIAGNOSIS — Z9109 Other allergy status, other than to drugs and biological substances: Secondary | ICD-10-CM | POA: Diagnosis not present

## 2021-12-03 ENCOUNTER — Encounter: Payer: Self-pay | Admitting: General Practice

## 2021-12-03 DIAGNOSIS — D801 Nonfamilial hypogammaglobulinemia: Secondary | ICD-10-CM | POA: Diagnosis not present

## 2021-12-03 NOTE — Progress Notes (Signed)
[facility] Spiritual Care Note ? ?Connected with Seylah by phone to provide opportunity to process a recent conflict. In addition, she has recently named the feelings and spiritual/emotional experience she is having right now: grief--related to recent deaths as well as her own disease state and discernment about treatment. This naming empowers her to focus on tasks of processing her goals, feelings, laments, and hopes for life, relationships, and priorities.  ? ?We plan to follow up when she returns from a much-anticipated trip to home people and places in [place]. ? ? ?Chaplain [name], MDiv, [facility] ?Pager [phone] ?Voicemail [phone]  ?

## 2021-12-18 ENCOUNTER — Encounter: Payer: Self-pay | Admitting: General Practice

## 2021-12-18 NOTE — Progress Notes (Signed)
[facility] Spiritual Care Note  Had lengthy, detailed follow-up visit with Morgan [name] in person today as she shared and processed updates including a deeply significant faith ritual, her son's joyful engagement, and a friend's terminal cancer. Served as a witness to her stories, providing pastoral presence and empathic listening.   Morgan [name] plans to reach out to schedule another visit.   [place], [place], [facility] Pager [phone] Voicemail [phone]

## 2021-12-19 ENCOUNTER — Encounter (INDEPENDENT_AMBULATORY_CARE_PROVIDER_SITE_OTHER): Payer: Medicare Other | Admitting: Family Medicine

## 2021-12-19 DIAGNOSIS — L0390 Cellulitis, unspecified: Secondary | ICD-10-CM

## 2021-12-20 MED ORDER — CLINDAMYCIN HCL 300 MG PO CAPS: 300.0000 mg | ORAL_CAPSULE | Freq: Three times a day (TID) | ORAL | 0 refills | Status: DC

## 2021-12-20 NOTE — Telephone Encounter (Signed)
Please see the MyChart message reply(ies) for my assessment and plan.  The patient gave consent for this Medical Advice Message and is aware that it may result in a bill to their insurance company as well as the possibility that this may result in a co-payment or deductible. They are an established patient, but are not seeking medical advice exclusively about a problem treated during an in person or video visit in the last 7 days. I did not recommend an in person or video visit within 7 days of my reply.  I spent a total of 10 minutes cumulative time within 7 days through MyChart messaging Everardo Voris, MD  

## 2021-12-23 DIAGNOSIS — K219 Gastro-esophageal reflux disease without esophagitis: Secondary | ICD-10-CM | POA: Diagnosis not present

## 2021-12-24 DIAGNOSIS — D801 Nonfamilial hypogammaglobulinemia: Secondary | ICD-10-CM | POA: Diagnosis not present

## 2021-12-25 ENCOUNTER — Encounter: Payer: Self-pay | Admitting: Family Medicine

## 2021-12-26 DIAGNOSIS — D801 Nonfamilial hypogammaglobulinemia: Secondary | ICD-10-CM | POA: Diagnosis not present

## 2021-12-27 ENCOUNTER — Encounter (HOSPITAL_BASED_OUTPATIENT_CLINIC_OR_DEPARTMENT_OTHER): Payer: Self-pay | Admitting: Emergency Medicine

## 2021-12-27 ENCOUNTER — Emergency Department (HOSPITAL_BASED_OUTPATIENT_CLINIC_OR_DEPARTMENT_OTHER): Payer: Medicare Other

## 2021-12-27 ENCOUNTER — Other Ambulatory Visit: Payer: Self-pay

## 2021-12-27 ENCOUNTER — Ambulatory Visit
Admission: RE | Admit: 2021-12-27 | Discharge: 2021-12-27 | Disposition: A | Discharge status: Home / Self Care | Payer: Medicare Other | Source: Ambulatory Visit | Attending: Family Medicine | Admitting: Family Medicine

## 2021-12-27 ENCOUNTER — Inpatient Hospital Stay (HOSPITAL_BASED_OUTPATIENT_CLINIC_OR_DEPARTMENT_OTHER)
Admission: EM | Admit: 2021-12-27 | Discharge: 2021-12-31 | DRG: 603 | Disposition: A | Discharge status: Home / Self Care | Payer: Medicare Other | Source: Ambulatory Visit | Attending: Internal Medicine | Admitting: Internal Medicine

## 2021-12-27 VITALS — BP 121/79 | HR 80 | Temp 99.2°F | Resp 18

## 2021-12-27 DIAGNOSIS — K589 Irritable bowel syndrome without diarrhea: Secondary | ICD-10-CM | POA: Diagnosis not present

## 2021-12-27 DIAGNOSIS — N179 Acute kidney failure, unspecified: Secondary | ICD-10-CM | POA: Diagnosis present

## 2021-12-27 DIAGNOSIS — Z6831 Body mass index (BMI) 31.0-31.9, adult: Secondary | ICD-10-CM

## 2021-12-27 DIAGNOSIS — Z91048 Other nonmedicinal substance allergy status: Secondary | ICD-10-CM

## 2021-12-27 DIAGNOSIS — Z880 Allergy status to penicillin: Secondary | ICD-10-CM

## 2021-12-27 DIAGNOSIS — Z79899 Other long term (current) drug therapy: Secondary | ICD-10-CM | POA: Diagnosis not present

## 2021-12-27 DIAGNOSIS — Z87891 Personal history of nicotine dependence: Secondary | ICD-10-CM | POA: Diagnosis not present

## 2021-12-27 DIAGNOSIS — C9110 Chronic lymphocytic leukemia of B-cell type not having achieved remission: Secondary | ICD-10-CM | POA: Diagnosis present

## 2021-12-27 DIAGNOSIS — Z888 Allergy status to other drugs, medicaments and biological substances status: Secondary | ICD-10-CM

## 2021-12-27 DIAGNOSIS — Z90710 Acquired absence of both cervix and uterus: Secondary | ICD-10-CM | POA: Diagnosis not present

## 2021-12-27 DIAGNOSIS — L03115 Cellulitis of right lower limb: Secondary | ICD-10-CM | POA: Diagnosis not present

## 2021-12-27 DIAGNOSIS — Z885 Allergy status to narcotic agent status: Secondary | ICD-10-CM

## 2021-12-27 DIAGNOSIS — L03116 Cellulitis of left lower limb: Secondary | ICD-10-CM | POA: Diagnosis not present

## 2021-12-27 DIAGNOSIS — F319 Bipolar disorder, unspecified: Secondary | ICD-10-CM | POA: Diagnosis present

## 2021-12-27 DIAGNOSIS — F603 Borderline personality disorder: Secondary | ICD-10-CM | POA: Diagnosis present

## 2021-12-27 DIAGNOSIS — F419 Anxiety disorder, unspecified: Secondary | ICD-10-CM | POA: Diagnosis present

## 2021-12-27 DIAGNOSIS — L0390 Cellulitis, unspecified: Secondary | ICD-10-CM | POA: Diagnosis not present

## 2021-12-27 DIAGNOSIS — A46 Erysipelas: Secondary | ICD-10-CM | POA: Diagnosis present

## 2021-12-27 DIAGNOSIS — Z8349 Family history of other endocrine, nutritional and metabolic diseases: Secondary | ICD-10-CM

## 2021-12-27 DIAGNOSIS — Z818 Family history of other mental and behavioral disorders: Secondary | ICD-10-CM

## 2021-12-27 DIAGNOSIS — Z8701 Personal history of pneumonia (recurrent): Secondary | ICD-10-CM

## 2021-12-27 DIAGNOSIS — Z79890 Hormone replacement therapy (postmenopausal): Secondary | ICD-10-CM | POA: Diagnosis not present

## 2021-12-27 DIAGNOSIS — J45909 Unspecified asthma, uncomplicated: Secondary | ICD-10-CM | POA: Diagnosis not present

## 2021-12-27 DIAGNOSIS — E875 Hyperkalemia: Secondary | ICD-10-CM | POA: Diagnosis not present

## 2021-12-27 DIAGNOSIS — Z87892 Personal history of anaphylaxis: Secondary | ICD-10-CM | POA: Diagnosis not present

## 2021-12-27 DIAGNOSIS — D649 Anemia, unspecified: Secondary | ICD-10-CM | POA: Diagnosis not present

## 2021-12-27 DIAGNOSIS — Z9851 Tubal ligation status: Secondary | ICD-10-CM | POA: Diagnosis not present

## 2021-12-27 DIAGNOSIS — E669 Obesity, unspecified: Secondary | ICD-10-CM | POA: Diagnosis present

## 2021-12-27 DIAGNOSIS — Z887 Allergy status to serum and vaccine status: Secondary | ICD-10-CM

## 2021-12-27 DIAGNOSIS — Z91010 Allergy to peanuts: Secondary | ICD-10-CM

## 2021-12-27 DIAGNOSIS — R238 Other skin changes: Secondary | ICD-10-CM | POA: Diagnosis not present

## 2021-12-27 DIAGNOSIS — D696 Thrombocytopenia, unspecified: Secondary | ICD-10-CM | POA: Diagnosis present

## 2021-12-27 DIAGNOSIS — Z7951 Long term (current) use of inhaled steroids: Secondary | ICD-10-CM

## 2021-12-27 DIAGNOSIS — Z882 Allergy status to sulfonamides status: Secondary | ICD-10-CM

## 2021-12-27 DIAGNOSIS — E039 Hypothyroidism, unspecified: Secondary | ICD-10-CM | POA: Diagnosis not present

## 2021-12-27 DIAGNOSIS — Z91040 Latex allergy status: Secondary | ICD-10-CM

## 2021-12-27 DIAGNOSIS — Z825 Family history of asthma and other chronic lower respiratory diseases: Secondary | ICD-10-CM

## 2021-12-27 DIAGNOSIS — Z91018 Allergy to other foods: Secondary | ICD-10-CM

## 2021-12-27 DIAGNOSIS — Z881 Allergy status to other antibiotic agents status: Secondary | ICD-10-CM

## 2021-12-27 LAB — TSH: TSH: 5.086 u[IU]/mL — ABNORMAL HIGH (ref 0.350–4.500)

## 2021-12-27 LAB — BASIC METABOLIC PANEL
Anion gap: 7 (ref 5–15)
BUN: 13 mg/dL (ref 6–20)
CO2: 22 mmol/L (ref 22–32)
Calcium: 9.4 mg/dL (ref 8.9–10.3)
Chloride: 109 mmol/L (ref 98–111)
Creatinine, Ser: 1.03 mg/dL — ABNORMAL HIGH (ref 0.44–1.00)
GFR, Estimated: >60 mL/min (ref >60)
Glucose, Bld: 86 mg/dL (ref 70–99)
Potassium: 4 mmol/L (ref 3.5–5.1)
Sodium: 138 mmol/L (ref 135–145)

## 2021-12-27 LAB — DIFFERENTIAL
Abs Immature Granulocytes: 0 10*3/uL (ref 0.00–0.07)
Basophils Absolute: 0 10*3/uL (ref 0.0–0.1)
Basophils Relative: 0 %
Eosinophils Absolute: 0.9 10*3/uL — ABNORMAL HIGH (ref 0.0–0.5)
Eosinophils Relative: 1 %
Lymphocytes Relative: 88 %
Lymphs Abs: 82.9 10*3/uL — ABNORMAL HIGH (ref 0.7–4.0)
Monocytes Absolute: 1.9 10*3/uL — ABNORMAL HIGH (ref 0.1–1.0)
Monocytes Relative: 2 %
Neutro Abs: 4.7 10*3/uL (ref 1.7–7.7)
Neutrophils Relative %: 5 %
Other: 4 %
Smear Review: NORMAL
WBC Morphology: ABNORMAL

## 2021-12-27 LAB — PHOSPHORUS: Phosphorus: 3.7 mg/dL (ref 2.5–4.6)

## 2021-12-27 LAB — CBC
HCT: 37.1 % (ref 36.0–46.0)
Hemoglobin: 12 g/dL (ref 12.0–15.0)
MCH: 27.7 pg (ref 26.0–34.0)
MCHC: 32.3 g/dL (ref 30.0–36.0)
MCV: 85.7 fL (ref 80.0–100.0)
Platelets: 136 10*3/uL — ABNORMAL LOW (ref 150–400)
RBC: 4.33 MIL/uL (ref 3.87–5.11)
RDW: 16 % — ABNORMAL HIGH (ref 11.5–15.5)
WBC: 94.2 10*3/uL (ref 4.0–10.5)
nRBC: 0 % (ref 0.0–0.2)

## 2021-12-27 LAB — MRSA NEXT GEN BY PCR, NASAL: MRSA by PCR Next Gen: NOT DETECTED

## 2021-12-27 LAB — CK: Total CK: 51 U/L (ref 38–234)

## 2021-12-27 LAB — MAGNESIUM: Magnesium: 2.6 mg/dL — ABNORMAL HIGH (ref 1.7–2.4)

## 2021-12-27 MED ORDER — HYOSCYAMINE SULFATE 0.125 MG PO TABS
0.1250 mg | ORAL_TABLET | ORAL | Status: DC | PRN
Start: 2021-12-27 — End: 2021-12-27

## 2021-12-27 MED ORDER — SENNOSIDES-DOCUSATE SODIUM 8.6-50 MG PO TABS
2.0000 | ORAL_TABLET | Freq: Every day | ORAL | Status: DC | PRN
Start: 2021-12-27 — End: 2021-12-28

## 2021-12-27 MED ORDER — LEVOTHYROXINE SODIUM 50 MCG PO TABS
50.0000 ug | ORAL_TABLET | Freq: Every day | ORAL | Status: DC
  Administered 2021-12-28 – 2021-12-31 (×4): 50 ug via ORAL
  Filled 2021-12-27 (×4): qty 1

## 2021-12-27 MED ORDER — RIZATRIPTAN BENZOATE 10 MG PO TBDP: 10.0000 mg | ORAL_TABLET | ORAL | Status: DC | PRN

## 2021-12-27 MED ORDER — DIPHENHYDRAMINE HCL 50 MG/ML IJ SOLN
25.0000 mg | Freq: Once | INTRAMUSCULAR | Status: AC
  Administered 2021-12-27: 25 mg via INTRAVENOUS
  Filled 2021-12-27: qty 1

## 2021-12-27 MED ORDER — ASPIRIN-ACETAMINOPHEN-CAFFEINE 250-250-65 MG PO TABS
2.0000 | ORAL_TABLET | Freq: Four times a day (QID) | ORAL | Status: DC | PRN
  Administered 2021-12-28 – 2021-12-30 (×2): 2 via ORAL
  Filled 2021-12-27 (×3): qty 2

## 2021-12-27 MED ORDER — DIPHENHYDRAMINE HCL 50 MG/ML IJ SOLN
25.0000 mg | Freq: Every day | INTRAMUSCULAR | Status: DC
  Administered 2021-12-28: 25 mg via INTRAVENOUS
  Filled 2021-12-27: qty 1

## 2021-12-27 MED ORDER — SODIUM CHLORIDE 0.9 % IV SOLN
1.0000 g | INTRAVENOUS | Status: DC
  Administered 2021-12-28 – 2021-12-30 (×3): 1 g via INTRAVENOUS
  Filled 2021-12-27 (×3): qty 10

## 2021-12-27 MED ORDER — DEXTROSE 5 % IV SOLN: 1500.0000 mg | Freq: Once | INTRAVENOUS | Status: DC

## 2021-12-27 MED ORDER — ALBUTEROL SULFATE HFA 108 (90 BASE) MCG/ACT IN AERS: 1.0000 | INHALATION_SPRAY | Freq: Four times a day (QID) | RESPIRATORY_TRACT | Status: DC | PRN

## 2021-12-27 MED ORDER — SODIUM CHLORIDE 0.9 % IV SOLN: INTRAVENOUS | Status: DC

## 2021-12-27 MED ORDER — PANTOPRAZOLE SODIUM 40 MG PO TBEC
40.0000 mg | DELAYED_RELEASE_TABLET | Freq: Every day | ORAL | Status: DC
  Filled 2021-12-27 (×2): qty 1

## 2021-12-27 MED ORDER — EPINEPHRINE 0.3 MG/0.3ML IJ SOAJ
0.3000 mg | INTRAMUSCULAR | Status: DC | PRN
Start: 2021-12-27 — End: 2021-12-31

## 2021-12-27 MED ORDER — SUMATRIPTAN SUCCINATE 25 MG PO TABS
100.0000 mg | ORAL_TABLET | ORAL | Status: DC | PRN
Start: 2021-12-27 — End: 2021-12-31

## 2021-12-27 MED ORDER — HEPARIN SODIUM (PORCINE) 5000 UNIT/ML IJ SOLN
5000.0000 [IU] | Freq: Three times a day (TID) | INTRAMUSCULAR | Status: DC
Start: 2021-12-27 — End: 2021-12-31
  Administered 2021-12-27 – 2021-12-28 (×2): 5000 [IU] via SUBCUTANEOUS
  Filled 2021-12-27 (×3): qty 1

## 2021-12-27 MED ORDER — CHOLECALCIFEROL 125 MCG (5000 UT) PO TABS: 7500.0000 [IU] | ORAL_TABLET | ORAL | Status: DC

## 2021-12-27 MED ORDER — FOLIC ACID 1 MG PO TABS
1.0000 mg | ORAL_TABLET | Freq: Every day | ORAL | Status: DC
  Filled 2021-12-27: qty 1

## 2021-12-27 MED ORDER — FLUTICASONE FUROATE-VILANTEROL 100-25 MCG/ACT IN AEPB
1.0000 | INHALATION_SPRAY | Freq: Every day | RESPIRATORY_TRACT | Status: DC
  Administered 2021-12-28 – 2021-12-31 (×3): 1 via RESPIRATORY_TRACT
  Filled 2021-12-27: qty 28

## 2021-12-27 MED ORDER — FLUTICASONE FUROATE-VILANTEROL 100-25 MCG/ACT IN AEPB: 1.0000 | INHALATION_SPRAY | Freq: Every day | RESPIRATORY_TRACT | Status: DC

## 2021-12-27 MED ORDER — ONDANSETRON HCL 4 MG PO TABS: 8.0000 mg | ORAL_TABLET | Freq: Three times a day (TID) | ORAL | Status: DC | PRN

## 2021-12-27 MED ORDER — ACETAMINOPHEN 500 MG PO TABS
1000.0000 mg | ORAL_TABLET | Freq: Four times a day (QID) | ORAL | Status: DC | PRN
  Administered 2021-12-27 – 2021-12-30 (×2): 1000 mg via ORAL
  Filled 2021-12-27 (×3): qty 2

## 2021-12-27 MED ORDER — SODIUM CHLORIDE 0.9 % IV SOLN
1.0000 g | Freq: Once | INTRAVENOUS | Status: AC
  Administered 2021-12-27: 1 g via INTRAVENOUS
  Filled 2021-12-27: qty 10

## 2021-12-27 MED ORDER — HYOSCYAMINE SULFATE 0.125 MG SL SUBL
0.1250 mg | SUBLINGUAL_TABLET | SUBLINGUAL | Status: DC | PRN
Start: 2021-12-27 — End: 2021-12-31

## 2021-12-27 MED ORDER — FERROUS SULFATE 325 (65 FE) MG PO TABS
325.0000 mg | ORAL_TABLET | Freq: Every day | ORAL | Status: DC
  Filled 2021-12-27: qty 1

## 2021-12-27 MED ORDER — ALBUTEROL SULFATE (2.5 MG/3ML) 0.083% IN NEBU: 2.5000 mg | INHALATION_SOLUTION | RESPIRATORY_TRACT | Status: DC | PRN

## 2021-12-27 NOTE — ED Triage Notes (Signed)
Pt sent here from UC re: cellulitis RLE; pt has multiple allergies and they were not able to provider her with appropriate abx

## 2021-12-27 NOTE — ED Notes (Signed)
Patient is being discharged from the Urgent Care and sent to the Emergency Department via [facility]. Per Jaynee Eagles PA-C, patient is in need of higher level of care due to need for further evaluation. Patient is aware and verbalizes understanding of plan of care.  Vitals:   12/27/21 1343  BP: 121/79  Pulse: 80  Resp: 18  Temp: 99.2 F (37.3 C)  SpO2: 96%

## 2021-12-27 NOTE — ED Triage Notes (Signed)
Pt c/o spider bite from last week and now has various mosquito bites to the upper right leg. She states she was diagnosed with cellulitis and is currently taking clindamycin. There is inflammation to her right upper leg. She reports waking up with headaches for a few days.   Patient is currently getting IV Ig treatment.  Patient states she has leukemia.  Home interventions: Excedrin

## 2021-12-27 NOTE — ED Provider Notes (Signed)
[facility] EMERGENCY DEPARTMENT Provider Note   CSN: [id number] Arrival date & time: 12/27/21  1445     History  Chief Complaint  Patient presents with   Wound Infection    Morgan [name] is a 56 y.o. female present emerged department concern for a leg infection.  The patient reports that she had insect stings on both of her legs, one on her left thigh last week, which she treated with a prolonged course of clindamycin, the redness improved but then she had redness ballooning up on the right leg near another sting site the past 2 days.  She says the redness is getting worse despite clindamycin.  She reports subjective fevers at home.  She was seen in urgent care today and referred into the ED because of her extensive list of allergies, there is no viable oral option for her to be treated for in terms of cellulitis and possible staph coverage.  HPI     Home Medications Prior to Admission medications   Medication Sig Start Date End Date Taking? Authorizing Provider  diphenhydrAMINE (BENADRYL) 25 MG tablet Take 1 tablet (25 mg total) by mouth every 6 (six) hours as needed for itching. Patient taking differently: Take 25 mg by mouth every 6 (six) hours as needed for itching. Uses as premed prior to IVIG 02/27/19  Yes Horton, Barbette Hair, MD  fluticasone furoate-vilanterol (BREO ELLIPTA) 100-25 MCG/ACT AEPB Inhale 1 puff into the lungs daily. 11/24/21  Yes Copland, Gay Filler, MD  hyoscyamine (LEVSIN) 0.125 MG tablet Take 1 tablet (0.125 mg total) by mouth as needed for cramping. Reported on 09/24/2015 02/17/21  Yes Copland, Gay Filler, MD  levothyroxine (SYNTHROID) 50 MCG tablet Take 1 tablet (50 mcg total) by mouth daily before breakfast. 11/23/21  Yes Copland, Gay Filler, MD  rizatriptan (MAXALT-MLT) 10 MG disintegrating tablet Take 1 tablet (10 mg total) by mouth as needed for migraine. May repeat in 2 hours if needed 08/20/21  Yes Lomax, Amy, NP  acetaminophen (TYLENOL) 500 MG tablet Take  1,000 mg by mouth every 6 (six) hours as needed for mild pain.    [provider]  albuterol (VENTOLIN HFA) 108 (90 Base) MCG/ACT inhaler Inhale 1-2 puffs into the lungs every 6 (six) hours as needed for wheezing or shortness of breath. 11/24/21   Copland, Gay Filler, MD  aspirin-acetaminophen-caffeine (EXCEDRIN EXTRA STRENGTH) [id number] MG tablet Take 2 tablets by mouth every 6 (six) hours as needed for headache.    [provider]  Cholecalciferol 125 MCG (5000 UT) TABS Take 7,500 Units by mouth every other day.    [provider]  clindamycin (CLEOCIN) 300 MG capsule Take 1 capsule (300 mg total) by mouth 3 (three) times daily. 12/20/21   Copland, Gay Filler, MD  EPINEPHrine 0.3 mg/0.3 mL IJ SOAJ injection Inject 0.3 mLs (0.3 mg total) into the muscle as needed for anaphylaxis. 01/04/20   Copland, Gay Filler, MD  ferrous sulfate 325 (65 FE) MG tablet Take 325 mg by mouth daily with breakfast. Take vitamin C along with ferrous sulfated    [provider]  fexofenadine (ALLEGRA) 180 MG tablet Take 180 mg by mouth daily.    [provider]  fluticasone furoate-vilanterol (BREO ELLIPTA) 100-25 MCG/INH AEPB Inhale 1 puff into the lungs daily. 01/04/20   Copland, Gay Filler, MD  folic acid (FOLVITE) 1 MG tablet Take 1 mg by mouth daily. 08/22/20   [provider]  Immune Globulin 10% (GAMUNEX-C) 10 GM/100ML SOLN Inject  into the vein. Every 3 weeks    [provider]  lidocaine-prilocaine (EMLA) cream Apply 1 application topically as needed.    [provider]  ondansetron (ZOFRAN) 8 MG tablet Take by mouth every 8 (eight) hours as needed for nausea or vomiting.    [provider]  pantoprazole (PROTONIX) 40 MG tablet Take 40 mg by mouth daily.    [provider]  senna-docusate (SENOKOT-S) 8.6-50 MG tablet Take 2 tablets by mouth daily as needed for mild constipation.    [provider]  triamcinolone cream (KENALOG)  0.1 % Apply topically 2 (two) times daily.    [provider]  UNABLE TO FIND Take 2.4 mg by mouth 2 (two) times daily. Med Name: Lithium OTC    [provider]      Allergies    Aripiprazole, Ciprofloxacin, Lamotrigine, Nitrofurantoin, Other, Peanut oil, Amoxicillin, Doxycycline, Latex, Meloxicam, Naproxen, Pramipexole, Prednisone, Risperidone, Sulfa antibiotics, Tape, Cortisone, Keflex [cephalexin], Percocet [oxycodone-acetaminophen], Shingrix [zoster vac recomb adjuvanted], Vancomycin, and Cuvitru [immune globulin (human)]    Review of Systems   Review of Systems  Physical Exam Updated Vital Signs BP 120/67 (BP Location: Left Arm)   Pulse 72   Temp (!) 97.4 F (36.3 C) (Oral)   Resp 16   Ht '5\' 3"'$  (1.6 m)   Wt 81.2 kg   SpO2 100%   BMI 31.71 kg/m  Physical Exam Constitutional:      General: She is not in acute distress. HENT:     Head: Normocephalic and atraumatic.  Eyes:     Conjunctiva/sclera: Conjunctivae normal.     Pupils: Pupils are equal, round, and reactive to light.  Cardiovascular:     Rate and Rhythm: Normal rate and regular rhythm.  Pulmonary:     Effort: Pulmonary effort is normal. No respiratory distress.  Skin:    General: Skin is warm and dry.     Comments: Erythema of the lower extremity as photographed below  Neurological:     General: No focal deficit present.     Mental Status: She is alert. Mental status is at baseline.  Psychiatric:        Mood and Affect: Mood normal.        Behavior: Behavior normal.         ED Results / Procedures / Treatments   Labs (all labs ordered are listed, but only abnormal results are displayed) Labs Reviewed  BASIC METABOLIC PANEL - Abnormal; Notable for the following components:      Result Value   Creatinine, Ser 1.03 (*)    All other components within normal limits  CBC - Abnormal; Notable for the following components:   WBC 94.2 (*)    RDW 16.0 (*)    Platelets 136 (*)    All other  components within normal limits  DIFFERENTIAL - Abnormal; Notable for the following components:   Lymphs Abs 82.9 (*)    Monocytes Absolute 1.9 (*)    Eosinophils Absolute 0.9 (*)    All other components within normal limits  MAGNESIUM - Abnormal; Notable for the following components:   Magnesium 2.6 (*)    All other components within normal limits  MRSA NEXT GEN BY PCR, NASAL  CK  PHOSPHORUS  HIV ANTIBODY (ROUTINE TESTING W REFLEX)  TSH  HEMOGLOBIN A1C  COMPREHENSIVE METABOLIC PANEL    EKG None  Radiology DG Chest Portable 1 View  Result Date: 12/27/2021 CLINICAL DATA:  Left leg cellulitis. EXAM: PORTABLE CHEST  1 VIEW COMPARISON:  December 24, 2020 FINDINGS: There is stable right-sided venous Port-A-Cath positioning. The heart size and mediastinal contours are within normal limits. A radiopaque loop recorder device is seen. Both lungs are clear. The visualized skeletal structures are unremarkable. IMPRESSION: No active cardiopulmonary disease. Electronically Signed   By: Virgina Norfolk M.D.   On: 12/27/2021 17:00    Procedures Procedures    Medications Ordered in ED Medications  diphenhydrAMINE (BENADRYL) injection 25 mg (has no administration in time range)  cefTRIAXone (ROCEPHIN) 1 g in sodium chloride 0.9 % 100 mL IVPB (has no administration in time range)  fluticasone furoate-vilanterol (BREO ELLIPTA) 100-25 MCG/INH 1 puff (has no administration in time range)  acetaminophen (TYLENOL) tablet 1,000 mg (has no administration in time range)  aspirin-acetaminophen-caffeine (EXCEDRIN MIGRAINE) per tablet 2 tablet (has no administration in time range)  rizatriptan (MAXALT-MLT) disintegrating tablet 10 mg (has no administration in time range)  EPINEPHrine (EPI-PEN) injection 0.3 mg (has no administration in time range)  levothyroxine (SYNTHROID) tablet 50 mcg (has no administration in time range)  hyoscyamine (LEVSIN) tablet 0.125 mg (has no administration in time range)   ondansetron (ZOFRAN) tablet 8 mg (has no administration in time range)  pantoprazole (PROTONIX) EC tablet 40 mg (has no administration in time range)  senna-docusate (Senokot-S) tablet 2 tablet (has no administration in time range)  ferrous sulfate tablet 325 mg (has no administration in time range)  folic acid (FOLVITE) tablet 1 mg (has no administration in time range)  Cholecalciferol TABS 7,500 Units (has no administration in time range)  albuterol (VENTOLIN HFA) 108 (90 Base) MCG/ACT inhaler 1-2 puff (has no administration in time range)  fluticasone furoate-vilanterol (BREO ELLIPTA) 100-25 MCG/ACT 1 puff (has no administration in time range)  heparin injection 5,000 Units (has no administration in time range)  0.9 %  sodium chloride infusion (has no administration in time range)  albuterol (PROVENTIL) (2.5 MG/3ML) 0.083% nebulizer solution 2.5 mg (has no administration in time range)  cefTRIAXone (ROCEPHIN) 1 g in sodium chloride 0.9 % 100 mL IVPB (0 g Intravenous Stopped 12/27/21 1814)  diphenhydrAMINE (BENADRYL) injection 25 mg (25 mg Intravenous Given 12/27/21 1700)    ED Course/ Medical Decision Making/ A&P Clinical Course as of 12/27/21 2136  Sat Dec 27, 2021  1753 WBC elevated in setting of CLL - no symptoms of hyperviscosity syndrome at this time.  Will admit  [MT]    Clinical Course User Index [MT] Olen Eaves, Carola Rhine, MD                           Medical Decision Making Amount and/or Complexity of Data Reviewed Labs: ordered. Radiology: ordered.  Risk Prescription drug management. Decision regarding hospitalization.   This patient presents to the ED with concern for skin infection. This involves an extensive number of treatment options, and is a complaint that carries with it a high risk of complications and morbidity.  This is most consistent with cellulitis, likely staph organism, from an insect sting.  We can do an MRSA swab.  It is not clear if she has a history of  this.  But with her frequent hospitalizations and hospital exposures, I think it is very likely that she would have been colonized by MRSA  Co-morbidities that complicate the patient evaluation: History of CLL, history of recurring skin infections, extensive drug allergy  External records from outside source obtained and reviewed including urgent care evaluation today  I  ordered and personally interpreted labs.  The pertinent results include: Leukocytosis in the setting of known CLL, likely triggered by infection.  BMP at baseline.  Patient be admitted to the hospital for cellulitis with failed outpatient therapy on clindamycin.  She has a chest port for her CLL management, and this may be one possible resource for home infusions if she responds favorably in the hospital to IV antibiotics.  IV Rocephin was ordered as well as Benadryl here, she does have cephalosporin itching allergies reported.  I do think she would benefit from observation in the hospital overnight given her extensive list of reactions to antibiotics, to ensure that she is stable on an antibiotic course prior to being sent home.  The patient is in agreement with this plan   Dispostion:  After consideration of the diagnostic results and the patients response to treatment, I feel that the patent would benefit from medical admission for cellulitis        Final Clinical Impression(s) / ED Diagnoses Final diagnoses:  Cellulitis, unspecified cellulitis site    Rx / DC Orders ED Discharge Orders          Ordered    Ambulatory referral to Infectious Disease       Comments: Cellulitis patient:  Received dalbavancin on 12/27/2021.   12/27/21 1555              Wyvonnia Dusky, MD 12/27/21 2136

## 2021-12-27 NOTE — ED Notes (Signed)
Patient ambulated to restroom without difficulty. Assisted back into bed. Patient given a new warm blanket, repositioned in bed per patient request and turned off lights. Call bell within reach. Denies any other needs at present. Patient is aware waiting on bed assignment.

## 2021-12-27 NOTE — H&P (Addendum)
History and Physical    Morgan [name] VQQ:[id number] DOB: 1965/12/28 DOA: 12/27/2021  PCP: Darreld Mclean, MD  Patient coming from: [facility]  I have personally briefly reviewed patient's old medical records in [place]  Chief Complaint: right thigh cellulitis  HPI: Morgan [name] is a 56 y.o. female with medical history significant  of CLL diagnosis Janurary 2018 currently RAI stage 1 s/p ibrutinib plus rituximab treatment ,currently on monitoring, asthma, bipolar disorder, prior alcohol abuse, IBS, hypothyroidism who presents with interim history of left thigh cellulitis s/p spider bite for which she is on Clindamycin x  1 week which has completely resolved. However, her course was complicated 2-3 days ago after she got mosquito bites on her right thigh and within 24 hours noted increase redness with streaking up her thigh associated with formation of tiny bullae. She states she also noted  associated fever/chills/increase fatigue and nausea. She states she became concerned and presented to Urgent care.  Patient evaluated in Urgent care and was referred  for inpatient admission due to history of multiple allergies to antibiotics and need for premedication with close monitoring on antibiotic treatment. Patient of note received ceftriaxone  s/p premedication with benadryl without allergy reaction thus far.  ED Course: Temp 99.2, bp 121/79, rr 18, hr 80  sat 96% on ra   Cxr: NAD  Labs:  NA 138, K 4 , Co2 22, cr 1.03  Wbc: 94 was in 30's previously ,plt 136,  Pmh 82.9, ck 51 Ths 5.086  Tx tylenol  1gram x 1  Review of Systems: As per HPI otherwise 10 point review of systems negative.   Past Medical History:  Diagnosis Date   Anxiety    Arthritis    Asthma    Bipolar disorder ([place])    Bulging lumbar disc 07/19/05   Chronic headaches    Chronic lymphocytic leukemia ([place]) 07/31/2016   Colon polyps    Degenerative disorder of bone    Depression    History of borderline  personality disorder    Hypothyroidism 2007   Developed after use of Lithium   IBS (irritable bowel syndrome) 1995   Pneumonia    Proctitis    Shingles 07/2018   Substance abuse ([place])    ativan last month    Past Surgical History:  Procedure Laterality Date   ABDOMINAL HYSTERECTOMY     APPENDECTOMY     BUNIONECTOMY Right 06/2012   CHOLECYSTECTOMY     OOPHORECTOMY     SHOULDER OPEN ROTATOR CUFF REPAIR Right 03/2010   TUBAL LIGATION       reports that she quit smoking about 41 years ago. Her smoking use included cigarettes. She has never used smokeless tobacco. She reports current alcohol use. She reports that she does not currently use drugs.  Allergies  Allergen Reactions   Aripiprazole Anaphylaxis and Swelling   Ciprofloxacin Shortness Of Breath   Lamotrigine Rash and Other (See Comments)   Nitrofurantoin Hives   Other Anaphylaxis, Hives and Swelling    Walnuts and pine nuts   Peanut Oil Anaphylaxis, Hives, Swelling and Rash   Amoxicillin Hives and Rash    Update - pt states she has been tested by her allergist and is no longer allergic to penicillin 03/2021   Doxycycline Hives and Rash   Latex Rash and Other (See Comments)   Meloxicam Itching and Other (See Comments)    Induces mania Interacts with Lithium   Naproxen Hives and Other (See Comments)  Interacts with lithium   Pramipexole Hives and Rash   Prednisone Other (See Comments)    Interacts with Lithium   Risperidone Hives and Rash   Sulfa Antibiotics Rash    "that leaves scarring"   Tape Itching and Rash    Steri-StripsT   Cortisone Other (See Comments)    Exacerbates the mania of her bipolar   Keflex [Cephalexin]     Pt developed a rash after taking high dose keflex for several days    Percocet [Oxycodone-Acetaminophen] Other (See Comments)    Severe dizziness   Shingrix [Zoster Vac Recomb Adjuvanted] Swelling    rash   Vancomycin Itching    Itching on head and at IV insertion site where Vanc  running   Cuvitru [Immune Globulin (Human)] Rash    Also allergic to Hizentra    Family History  Problem Relation Age of Onset   Depression Mother    Hypertension Mother    Thyroid disease Father    Alzheimer's disease Father    Post-traumatic stress disorder Sister    Alcohol abuse Brother    Drug abuse Brother    Post-traumatic stress disorder Brother    COPD Maternal Grandmother    Heart disease Maternal Grandfather    Arthritis Paternal Grandmother    Diabetes Paternal Grandmother    Depression Paternal Grandmother    Alzheimer's disease Paternal Grandmother    Heart disease Paternal Grandfather    Coronary artery disease Paternal Grandfather    Alcohol abuse Paternal Grandfather    Sleep apnea Cousin    Diabetes Maternal Uncle    Colon cancer Neg Hx    Breast cancer Neg Hx     Prior to Admission medications   Medication Sig Start Date End Date Taking? Authorizing Provider  diphenhydrAMINE (BENADRYL) 25 MG tablet Take 1 tablet (25 mg total) by mouth every 6 (six) hours as needed for itching. Patient taking differently: Take 25 mg by mouth every 6 (six) hours as needed for itching. Uses as premed prior to IVIG 02/27/19  Yes Horton, Barbette Hair, MD  fluticasone furoate-vilanterol (BREO ELLIPTA) 100-25 MCG/ACT AEPB Inhale 1 puff into the lungs daily. 11/24/21  Yes Copland, Gay Filler, MD  hyoscyamine (LEVSIN) 0.125 MG tablet Take 1 tablet (0.125 mg total) by mouth as needed for cramping. Reported on 09/24/2015 02/17/21  Yes Copland, Gay Filler, MD  levothyroxine (SYNTHROID) 50 MCG tablet Take 1 tablet (50 mcg total) by mouth daily before breakfast. 11/23/21  Yes Copland, Gay Filler, MD  rizatriptan (MAXALT-MLT) 10 MG disintegrating tablet Take 1 tablet (10 mg total) by mouth as needed for migraine. May repeat in 2 hours if needed 08/20/21  Yes Lomax, Amy, NP  acetaminophen (TYLENOL) 500 MG tablet Take 1,000 mg by mouth every 6 (six) hours as needed for mild pain.    [provider]   albuterol (VENTOLIN HFA) 108 (90 Base) MCG/ACT inhaler Inhale 1-2 puffs into the lungs every 6 (six) hours as needed for wheezing or shortness of breath. 11/24/21   Copland, Gay Filler, MD  aspirin-acetaminophen-caffeine (EXCEDRIN EXTRA STRENGTH) [phone] MG tablet Take 2 tablets by mouth every 6 (six) hours as needed for headache.    [provider]  Cholecalciferol 125 MCG (5000 UT) TABS Take 7,500 Units by mouth every other day.    [provider]  clindamycin (CLEOCIN) 300 MG capsule Take 1 capsule (300 mg total) by mouth 3 (three) times daily. 12/20/21   Copland, Gay Filler, MD  EPINEPHrine 0.3 mg/0.3 mL  IJ SOAJ injection Inject 0.3 mLs (0.3 mg total) into the muscle as needed for anaphylaxis. 01/04/20   Copland, Gay Filler, MD  ferrous sulfate 325 (65 FE) MG tablet Take 325 mg by mouth daily with breakfast. Take vitamin C along with ferrous sulfated    [provider]  fexofenadine (ALLEGRA) 180 MG tablet Take 180 mg by mouth daily.    [provider]  fluticasone furoate-vilanterol (BREO ELLIPTA) 100-25 MCG/INH AEPB Inhale 1 puff into the lungs daily. 01/04/20   Copland, Gay Filler, MD  folic acid (FOLVITE) 1 MG tablet Take 1 mg by mouth daily. 08/22/20   [provider]  Immune Globulin 10% (GAMUNEX-C) 10 GM/100ML SOLN Inject into the vein. Every 3 weeks    [provider]  lidocaine-prilocaine (EMLA) cream Apply 1 application topically as needed.    [provider]  ondansetron (ZOFRAN) 8 MG tablet Take by mouth every 8 (eight) hours as needed for nausea or vomiting.    [provider]  pantoprazole (PROTONIX) 40 MG tablet Take 40 mg by mouth daily.    [provider]  senna-docusate (SENOKOT-S) 8.6-50 MG tablet Take 2 tablets by mouth daily as needed for mild constipation.    [provider]  triamcinolone cream (KENALOG) 0.1 % Apply topically 2 (two) times daily.    [provider]  UNABLE TO FIND  Take 2.4 mg by mouth 2 (two) times daily. Med Name: Lithium OTC    [provider]    Physical Exam: Vitals:   12/27/21 1730 12/27/21 1808 12/27/21 1900 12/27/21 2022  BP: 120/60 (!) 107/53 108/69 120/67  Pulse: 74 72 71 72  Resp: '18 18 16 16  '$ Temp:    (!) 97.4 F (36.3 C)  TempSrc:    Oral  SpO2: 99% 99% 97% 100%  Weight:      Height:        Vitals:   12/27/21 1730 12/27/21 1808 12/27/21 1900 12/27/21 2022  BP: 120/60 (!) 107/53 108/69 120/67  Pulse: 74 72 71 72  Resp: '18 18 16 16  '$ Temp:    (!) 97.4 F (36.3 C)  TempSrc:    Oral  SpO2: 99% 99% 97% 100%  Weight:      Height:       Constitutional: NAD, calm, comfortable Eyes: PERRL, lids and conjunctivae normal ENMT: Mucous membranes are moist. Posterior pharynx clear of any exudate or lesions.Normal dentition.  Neck: normal, supple, no masses, no thyromegaly Respiratory: clear to auscultation bilaterally, no wheezing, no crackles. Normal respiratory effort. No accessory muscle use.  Cardiovascular: Regular rate and rhythm, no murmurs / rubs / gallops. No extremity edema. 2+ pedal pulses.  Abdomen: no tenderness, no masses palpated. No hepatosplenomegaly. Bowel sounds positive.  Musculoskeletal: no clubbing / cyanosis. No joint deformity upper and lower extremities. Good ROM, no contractures. Normal muscle tone.  Skin: cellulitis right mid thigh extending upward , are of bullae formation, healing insect bite on left thigh No induration Neurologic: CN 2-12 grossly intact. Sensation intact, Strength 5/5 in all 4.  Psychiatric: Normal judgment and insight. Alert and oriented x 3. Normal mood.    Labs on Admission: I have personally reviewed following labs and imaging studies  CBC: Recent Labs  Lab 12/27/21 1657  WBC 94.2*  NEUTROABS 4.7  HGB 12.0  HCT 37.1  MCV 85.7  PLT 220*   Basic Metabolic Panel: Recent Labs  Lab 12/27/21 1657  NA 138  K 4.0  CL 109  CO2 22  GLUCOSE 86  BUN 13  CREATININE  1.03*  CALCIUM 9.4  MG 2.6*  PHOS 3.7   GFR: Estimated Creatinine Clearance: 60.8 mL/min (A) (by C-G formula based on SCr of 1.03 mg/dL (H)). Liver Function Tests: No results for input(s): "AST", "ALT", "ALKPHOS", "BILITOT", "PROT", "ALBUMIN" in the last 168 hours. No results for input(s): "LIPASE", "AMYLASE" in the last 168 hours. No results for input(s): "AMMONIA" in the last 168 hours. Coagulation Profile: No results for input(s): "INR", "PROTIME" in the last 168 hours. Cardiac Enzymes: Recent Labs  Lab 12/27/21 1657  CKTOTAL 51   BNP (last 3 results) No results for input(s): "PROBNP" in the last 8760 hours. HbA1C: No results for input(s): "HGBA1C" in the last 72 hours. CBG: No results for input(s): "GLUCAP" in the last 168 hours. Lipid Profile: No results for input(s): "CHOL", "HDL", "LDLCALC", "TRIG", "CHOLHDL", "LDLDIRECT" in the last 72 hours. Thyroid Function Tests: No results for input(s): "TSH", "T4TOTAL", "FREET4", "T3FREE", "THYROIDAB" in the last 72 hours. Anemia Panel: No results for input(s): "VITAMINB12", "FOLATE", "FERRITIN", "TIBC", "IRON", "RETICCTPCT" in the last 72 hours. Urine analysis:    Component Value Date/Time   COLORURINE STRAW (A) 06/04/2019 1940   APPEARANCEUR CLEAR 06/04/2019 1940   LABSPEC <1.005 (L) 06/04/2019 1940   PHURINE 6.0 06/04/2019 1940   GLUCOSEU NEGATIVE 06/04/2019 1940   HGBUR NEGATIVE 06/04/2019 1940   BILIRUBINUR negative 12/07/2019 1535   BILIRUBINUR negative 07/06/2018 1100   KETONESUR negative 12/07/2019 [place] 06/04/2019 1940   PROTEINUR negative 12/07/2019 1535   PROTEINUR NEGATIVE 06/04/2019 1940   UROBILINOGEN 0.2 12/07/2019 1535   UROBILINOGEN 0.2 02/28/2015 1312   NITRITE Negative 12/07/2019 1535   NITRITE NEGATIVE 06/04/2019 1940   LEUKOCYTESUR Negative 12/07/2019 1535   LEUKOCYTESUR NEGATIVE 06/04/2019 1940    Radiological Exams on Admission: DG Chest Portable 1 View  Result Date:  12/27/2021 CLINICAL DATA:  Left leg cellulitis. EXAM: PORTABLE CHEST 1 VIEW COMPARISON:  December 24, 2020 FINDINGS: There is stable right-sided venous Port-A-Cath positioning. The heart size and mediastinal contours are within normal limits. A radiopaque loop recorder device is seen. Both lungs are clear. The visualized skeletal structures are unremarkable. IMPRESSION: No active cardiopulmonary disease. Electronically Signed   By: Virgina Norfolk M.D.   On: 12/27/2021 17:00    EKG: Independently reviewed.  N/a Assessment/Plan  Cellulitis of Right thigh -hx of multiple allergies to antibiotics  -premedication with benadryl and continue with ctx iv x 5 days  -if patient has no improvement consider ID involvement  -monitor inflammatory markers   CLL -RAI stage I  -most recent medication stopped due to intolerance per heme note  -patient noted only fatigue  -of note leukocytosis above baseline and elevation does not appear to be completely explained by current infection  -will consult hematology for further assistance   Asthma -no current exacerbation  -prn nebs , and resume controlled inhaler   Bipolar disorder -resume home medication    IBS -no active issues currently    Hypothyroidism -continue synthroid   DVT prophylaxis: heparin  Code Status: full Family Communication: none at bedside Disposition Plan: patient  expected to be admitted greater than 2 midnights d Consults called: hematology  Admission status: inpatient    Clance Boll MD [facility]   If 7PM-7AM, please contact night-coverage [email or web address] Password [facility]  12/27/2021, 9:27 PM

## 2021-12-27 NOTE — ED Provider Notes (Signed)
I spoke to Dr Alen Blew from oncology regarding leukocytosis, which he reports would be typical with an active infection.  Plan to treat infection - no other emergent intervention needed at this time.  No symptoms of hyperviscosity syndrome at this time.   Wyvonnia Dusky, MD 12/27/21 [phone]

## 2021-12-27 NOTE — ED Notes (Signed)
Patient ambulatory to restroom at this time. 

## 2021-12-27 NOTE — ED Provider Notes (Addendum)
[place]   MRN: [id number] DOB: October 30, 1965  Subjective:   Morgan [name] is a 56 y.o. female presenting for 2-3 day history of acute onset worsening right thigh pain, swelling, redness, drainage and red streaking.  Patient states that this came from various mosquito bites.  She has actually been on clindamycin over the past week from a spider bite.  She is very concerned for severe systemic infection as her symptoms have worsened despite being on clindamycin.  In the past day has developed more nausea, chills, sweating episodes, today had a headache with slight photophobia.  She did take Excedrin to help with this.  She also bought over-the-counter creams which she has not started.  She is currently undergoing treatment with IVIG for chronic lymphocytic leukemia.  Has a history of multiple medication allergies including allergies to Keflex, doxycycline, sulfa drugs.  Has sensitivities to medications.  She did contact her PCP at the end of the week and was advised to seek further care through the emergency room or urgent care.  No current facility-administered medications for this encounter.  Current Outpatient Medications:    acetaminophen (TYLENOL) 500 MG tablet, Take 1,000 mg by mouth every 6 (six) hours as needed for mild pain., Disp: , Rfl:    albuterol (VENTOLIN HFA) 108 (90 Base) MCG/ACT inhaler, Inhale 1-2 puffs into the lungs every 6 (six) hours as needed for wheezing or shortness of breath., Disp: 18 g, Rfl: 6   aspirin-acetaminophen-caffeine (EXCEDRIN EXTRA STRENGTH) 250-250-65 MG tablet, Take 2 tablets by mouth every 6 (six) hours as needed for headache., Disp: , Rfl:    Cholecalciferol 125 MCG (5000 UT) TABS, Take 7,500 Units by mouth every other day., Disp: , Rfl:    clindamycin (CLEOCIN) 300 MG capsule, Take 1 capsule (300 mg total) by mouth 3 (three) times daily., Disp: 21 capsule, Rfl: 0   diphenhydrAMINE (BENADRYL) 25 MG tablet, Take 1 tablet (25 mg  total) by mouth every 6 (six) hours as needed for itching. (Patient taking differently: Take 25 mg by mouth every 6 (six) hours as needed for itching. Uses as premed prior to IVIG), Disp: 20 tablet, Rfl: 0   EPINEPHrine 0.3 mg/0.3 mL IJ SOAJ injection, Inject 0.3 mLs (0.3 mg total) into the muscle as needed for anaphylaxis., Disp: 1 each, Rfl: prn   ferrous sulfate 325 (65 FE) MG tablet, Take 325 mg by mouth daily with breakfast. Take vitamin C along with ferrous sulfated, Disp: , Rfl:    fexofenadine (ALLEGRA) 180 MG tablet, Take 180 mg by mouth daily., Disp: , Rfl:    fluticasone furoate-vilanterol (BREO ELLIPTA) 100-25 MCG/ACT AEPB, Inhale 1 puff into the lungs daily., Disp: 1 each, Rfl: 11   fluticasone furoate-vilanterol (BREO ELLIPTA) 100-25 MCG/INH AEPB, Inhale 1 puff into the lungs daily., Disp: 1 each, Rfl: 11   folic acid (FOLVITE) 1 MG tablet, Take 1 mg by mouth daily., Disp: , Rfl:    hyoscyamine (LEVSIN) 0.125 MG tablet, Take 1 tablet (0.125 mg total) by mouth as needed for cramping. Reported on 09/24/2015, Disp: 10 tablet, Rfl: 2   Immune Globulin 10% (GAMUNEX-C) 10 GM/100ML SOLN, Inject into the vein. Every 3 weeks, Disp: , Rfl:    levothyroxine (SYNTHROID) 50 MCG tablet, Take 1 tablet (50 mcg total) by mouth daily before breakfast., Disp: 90 tablet, Rfl: 3   lidocaine-prilocaine (EMLA) cream, Apply 1 application topically as needed., Disp: , Rfl:    ondansetron (ZOFRAN) 8 MG tablet, Take by mouth  every 8 (eight) hours as needed for nausea or vomiting., Disp: , Rfl:    pantoprazole (PROTONIX) 40 MG tablet, Take 40 mg by mouth daily., Disp: , Rfl:    rizatriptan (MAXALT-MLT) 10 MG disintegrating tablet, Take 1 tablet (10 mg total) by mouth as needed for migraine. May repeat in 2 hours if needed, Disp: 9 tablet, Rfl: 11   senna-docusate (SENOKOT-S) 8.6-50 MG tablet, Take 2 tablets by mouth daily as needed for mild constipation., Disp: , Rfl:    triamcinolone cream (KENALOG) 0.1 %, Apply  topically 2 (two) times daily., Disp: , Rfl:    UNABLE TO FIND, Take 2.4 mg by mouth 2 (two) times daily. Med Name: Lithium OTC, Disp: , Rfl:    Allergies  Allergen Reactions   Aripiprazole Anaphylaxis and Swelling   Ciprofloxacin Shortness Of Breath   Lamotrigine Rash and Other (See Comments)   Nitrofurantoin Hives   Other Anaphylaxis, Hives and Swelling    Walnuts and pine nuts   Peanut Oil Anaphylaxis, Hives, Swelling and Rash   Amoxicillin Hives and Rash    Update - pt states she has been tested by her allergist and is no longer allergic to penicillin 03/2021   Doxycycline Hives and Rash   Latex Rash and Other (See Comments)   Meloxicam Itching and Other (See Comments)    Induces mania Interacts with Lithium   Naproxen Hives and Other (See Comments)    Interacts with lithium   Pramipexole Hives and Rash   Prednisone Other (See Comments)    Interacts with Lithium   Risperidone Hives and Rash   Sulfa Antibiotics Rash    "that leaves scarring"   Tape Itching and Rash    Steri-StripsT   Cortisone Other (See Comments)    Exacerbates the mania of her bipolar   Keflex [Cephalexin]     Pt developed a rash after taking high dose keflex for several days    Percocet [Oxycodone-Acetaminophen] Other (See Comments)    Severe dizziness   Shingrix [Zoster Vac Recomb Adjuvanted] Swelling    rash   Vancomycin Itching    Itching on head and at IV insertion site where Vanc running   Cuvitru [Immune Globulin (Human)] Rash    Also allergic to Hizentra    Past Medical History:  Diagnosis Date   Anxiety    Arthritis    Asthma    Bipolar disorder ([place])    Bulging lumbar disc 07/19/05   Chronic headaches    Chronic lymphocytic leukemia ([place]) 07/31/2016   Colon polyps    Degenerative disorder of bone    Depression    History of borderline personality disorder    Hypothyroidism 2007   Developed after use of Lithium   IBS (irritable bowel syndrome) 1995   Pneumonia    Proctitis     Shingles 07/2018   Substance abuse ([place])    ativan last month     Past Surgical History:  Procedure Laterality Date   ABDOMINAL HYSTERECTOMY     APPENDECTOMY     BUNIONECTOMY Right 06/2012   CHOLECYSTECTOMY     OOPHORECTOMY     SHOULDER OPEN ROTATOR CUFF REPAIR Right 03/2010   TUBAL LIGATION      Family History  Problem Relation Age of Onset   Depression Mother    Hypertension Mother    Thyroid disease Father    Alzheimer's disease Father    Post-traumatic stress disorder Sister    Alcohol abuse Brother    Drug abuse  Brother    Post-traumatic stress disorder Brother    COPD Maternal Grandmother    Heart disease Maternal Grandfather    Arthritis Paternal Grandmother    Diabetes Paternal Grandmother    Depression Paternal Grandmother    Alzheimer's disease Paternal Grandmother    Heart disease Paternal Grandfather    Coronary artery disease Paternal Grandfather    Alcohol abuse Paternal Grandfather    Sleep apnea Cousin    Diabetes Maternal Uncle    Colon cancer Neg Hx    Breast cancer Neg Hx     Social History   Tobacco Use   Smoking status: Former    Types: Cigarettes    Quit date: 07/20/1980    Years since quitting: 41.4   Smokeless tobacco: Never   Tobacco comments:    Also exposed to second hand smoke as a child  Vaping Use   Vaping Use: Never used  Substance Use Topics   Alcohol use: Yes    Alcohol/week: 0.0 standard drinks of alcohol    Comment: occ   Drug use: Not Currently    ROS   Objective:   Vitals: BP 121/79 (BP Location: Right Arm)   Pulse 80   Temp 99.2 F (37.3 C) (Oral)   Resp 18   SpO2 96%   Physical Exam Constitutional:      General: She is not in acute distress.    Appearance: Normal appearance. She is well-developed. She is not ill-appearing, toxic-appearing or diaphoretic.  HENT:     Head: Normocephalic and atraumatic.     Nose: Nose normal.     Mouth/Throat:     Mouth: Mucous membranes are moist.  Eyes:     General:  No scleral icterus.       Right eye: No discharge.        Left eye: No discharge.     Extraocular Movements: Extraocular movements intact.     Comments: Photophobia noted.  Cardiovascular:     Rate and Rhythm: Normal rate.  Pulmonary:     Effort: Pulmonary effort is normal.  Skin:    General: Skin is warm and dry.     Findings: Rash present.       Neurological:     General: No focal deficit present.     Mental Status: She is alert and oriented to person, place, and time.     Cranial Nerves: No cranial nerve deficit.     Motor: No weakness.     Coordination: Coordination normal.     Gait: Gait normal.     Deep Tendon Reflexes: Reflexes normal.  Psychiatric:        Mood and Affect: Mood normal.        Behavior: Behavior normal.          Assessment and Plan :   PDMP not reviewed this encounter.  1. Cellulitis of right thigh   2. CLL (chronic lymphocytic leukemia) ([place])    Case discussed with Dr. Lanny Cramp. Unfortunately, patient has significant allergy list that prevents [place] from using our standard antibiotics for addressing cellulitis.  Patient would benefit from a consultation with infectious disease as she has had difficulty with infections of this kind in the past and especially in the context of her significant allergy list.  We are not able to obtain cultures through our clinic.  Patient prefers to avoid using more clindamycin as it has not helped her recurrent cellulitis.  I discussed the possibility of using steroids but patient is  concerned about more severe illness and I am in agreement given her worsening symptoms.  As such, patient plans on presenting to the emergency room now.  She is hemodynamically stable and can present by personal vehicle.    Jaynee Eagles, PA-C 12/27/21 1443

## 2021-12-28 DIAGNOSIS — L0390 Cellulitis, unspecified: Secondary | ICD-10-CM

## 2021-12-28 DIAGNOSIS — C9110 Chronic lymphocytic leukemia of B-cell type not having achieved remission: Secondary | ICD-10-CM

## 2021-12-28 LAB — COMPREHENSIVE METABOLIC PANEL WITH GFR
ALT: 15 U/L (ref 0–44)
AST: 34 U/L (ref 15–41)
Albumin: 3.1 g/dL — ABNORMAL LOW (ref 3.5–5.0)
Alkaline Phosphatase: 52 U/L (ref 38–126)
Anion gap: 3 — ABNORMAL LOW (ref 5–15)
BUN: 11 mg/dL (ref 6–20)
CO2: 22 mmol/L (ref 22–32)
Calcium: 7.8 mg/dL — ABNORMAL LOW (ref 8.9–10.3)
Chloride: 117 mmol/L — ABNORMAL HIGH (ref 98–111)
Creatinine, Ser: 0.87 mg/dL (ref 0.44–1.00)
GFR, Estimated: >60 mL/min
Glucose, Bld: 87 mg/dL (ref 70–99)
Potassium: 4.9 mmol/L (ref 3.5–5.1)
Sodium: 142 mmol/L (ref 135–145)
Total Bilirubin: 0.4 mg/dL (ref 0.3–1.2)
Total Protein: 6.4 g/dL — ABNORMAL LOW (ref 6.5–8.1)

## 2021-12-28 LAB — HIV ANTIBODY (ROUTINE TESTING W REFLEX): HIV Screen 4th Generation wRfx: NONREACTIVE

## 2021-12-28 LAB — HEMOGLOBIN A1C
Hgb A1c MFr Bld: 5.2 % (ref 4.8–5.6)
Mean Plasma Glucose: 102.54 mg/dL

## 2021-12-28 MED ORDER — CHLORHEXIDINE GLUCONATE CLOTH 2 % EX PADS
6.0000 | MEDICATED_PAD | Freq: Every day | CUTANEOUS | Status: DC
  Administered 2021-12-28 – 2021-12-30 (×3): 6 via TOPICAL

## 2021-12-28 MED ORDER — HYDROXYZINE HCL 50 MG/ML IM SOLN
50.0000 mg | Freq: Four times a day (QID) | INTRAMUSCULAR | Status: DC | PRN
  Filled 2021-12-28: qty 1

## 2021-12-28 MED ORDER — SENNOSIDES-DOCUSATE SODIUM 8.6-50 MG PO TABS
2.0000 | ORAL_TABLET | Freq: Every day | ORAL | Status: DC
Start: 2021-12-28 — End: 2021-12-31
  Administered 2021-12-28: 2 via ORAL
  Filled 2021-12-28 (×2): qty 2

## 2021-12-28 MED ORDER — SODIUM CHLORIDE 0.9% FLUSH: 10.0000 mL | INTRAVENOUS | Status: DC | PRN

## 2021-12-28 NOTE — Progress Notes (Signed)
I was contacted by the emergency room physician (Dr. Langston Masker) regarding this patient's elevated white cell counts.  56 year old woman with history of CLL currently receiving her care at [facility].  She is currently off treatment due to side effects associated with Cal[place].  She was hospitalized with cellulitis failed oral antibiotic therapy.   Laboratory data reviewed with total white cell count was 37.8 on Nov 18, 2021 with 88% lymphocytes.  CBC on December 27, 2021 showed a white cell count of 94.2 with normal hemoglobin and platelet of 136.  He has 88% lymphocytes as well.  The elevation in the white cell count is consistent with her CLL and likely exacerbated by cellulitis.  There is no additional intervention needed from a hematology standpoint inpatient.  I agree with the current treatment with intravenous antibiotics and follow-up with her oncologist at [facility] as scheduled upon discharge.

## 2021-12-28 NOTE — Progress Notes (Signed)
PROGRESS NOTE    Morgan [name]  HYW:[id number] DOB: April 09, 1966 DOA: 12/27/2021 PCP: Darreld Mclean, MD     Brief Narrative:   CLL diagnosis Janurary 2018 currently RAI stage 1 s/p ibrutinib plus rituximab treatment ,currently on monitoring, asthma, bipolar disorder, prior alcohol abuse, IBS, hypothyroidism who presents with interim history of left thigh cellulitis s/p spider bite for which she is on Clindamycin x  1 week which has completely resolved. However, her course was complicated 2-3 days ago after she got mosquito bites on her right thigh and within 24 hours noted increase redness with streaking up her thigh associated with formation of tiny bullae. She states she also noted  associated fever/chills/increase fatigue and nausea. She states she became concerned and presented to Urgent care.  Patient evaluated in Urgent care and was referred  for inpatient admission due to history of multiple allergies to antibiotics and need for premedication with close monitoring on antibiotic treatment. Patient of note received ceftriaxone  s/p premedication with benadryl without allergy reaction thus far.  Subjective:  Right left and inner thigh Erythema slightly improved She is ambulating without difficulty  No fever  Assessment & Plan:  Principal Problem:   Cellulitis    Cellulitis of Right thigh/erysipelas -hx of multiple allergies to antibiotics  -premedication with benadryl and continue with ctx iv x 5 days  -Appears start to improve, continue current antibiotics   CLL -RAI stage I  -most recent medication stopped due to intolerance per heme note  -patient noted only fatigue  -has chronic leukocytosis but now significantly above baseline, oncology Dr Alen Blew recommend treat infection and outpatient follow up with [facility]  -repeat cbc      Body mass index is 31.71 kg/m..meet obesity criteria       I have Reviewed nursing notes, Vitals, pain scores, I/o's, Lab results  and  imaging results since pt's last encounter, details please see discussion above  I ordered the following labs:  Unresulted Labs (From admission, onward)     Start     Ordered   12/29/21 0500  CBC  Tomorrow morning,   R       Question:  Specimen collection method  Answer:  Lab=Lab collect   12/28/21 1425   12/29/21 4854  Basic metabolic panel  Tomorrow morning,   R       Question:  Specimen collection method  Answer:  Lab=Lab collect   12/28/21 1425   12/27/21 1657  Pathologist smear review  Once,   R        12/27/21 1657             DVT prophylaxis: heparin injection 5,000 Units Start: 12/27/21 2215   Code Status:   Code Status: Full Code  Family Communication: patient Disposition:   Dispo: The patient is from: home              Anticipated d/c is to: home              Anticipated d/c date is: 24-48hrs pending improvement in cellulitis  Antimicrobials:     Anti-infectives (From admission, onward)    Start     Dose/Rate Route Frequency Ordered Stop   12/28/21 1730  cefTRIAXone (ROCEPHIN) 1 g in sodium chloride 0.9 % 100 mL IVPB       Note to Pharmacy: Prior to administration will need benadryl premedication   1 g 200 mL/hr over 30 Minutes Intravenous Every 24 hours 12/27/21 2127 01/02/22 1729  12/27/21 1645  cefTRIAXone (ROCEPHIN) 1 g in sodium chloride 0.9 % 100 mL IVPB        1 g 200 mL/hr over 30 Minutes Intravenous  Once 12/27/21 1639 12/27/21 1814   12/27/21 1600  dalbavancin (DALVANCE) 1,500 mg in dextrose 5 % 500 mL IVPB  Status:  Discontinued        1,500 mg 967.7 mL/hr over 31 Minutes Intravenous Once 12/27/21 1555 12/27/21 1559         Objective: Vitals:   12/28/21 0500 12/28/21 0522 12/28/21 1003 12/28/21 1331  BP:  120/69 119/68 136/82  Pulse:  72 80 68  Resp:  '18 16 14  '$ Temp:  97.9 F (36.6 C) 98.4 F (36.9 C) (!) 97.5 F (36.4 C)  TempSrc:  Oral Oral Oral  SpO2:  95% 100% 97%  Weight: 81.2 kg     Height:        Intake/Output  Summary (Last 24 hours) at 12/28/2021 1517 Last data filed at 12/28/2021 1433 Gross per 24 hour  Intake 1134.5 ml  Output --  Net 1134.5 ml   Filed Weights   12/27/21 1501 12/28/21 0500  Weight: 81.2 kg 81.2 kg    Examination:  General exam: alert, awake, communicative,calm, NAD Respiratory system: Clear to auscultation. Respiratory effort normal. Cardiovascular system:  RRR.  Gastrointestinal system: Abdomen is nondistended, soft and nontender.  Normal bowel sounds heard. Central nervous system: Alert and oriented. No focal neurological deficits. Extremities:  no edema Skin:  receding erythema right upper and inner thigh ( reports from mosquitos bites), resolved left thigh cellulitis ( reports from spider bite) Psychiatry: Anxious    Data Reviewed: I have personally reviewed  labs and visualized  imaging studies since the last encounter and formulate the plan        Scheduled Meds:  Chlorhexidine Gluconate Cloth  6 each Topical Daily   diphenhydrAMINE  25 mg Intravenous Daily   ferrous sulfate  325 mg Oral Q breakfast   fluticasone furoate-vilanterol  1 puff Inhalation Daily   folic acid  1 mg Oral Daily   heparin  5,000 Units Subcutaneous Q8H   levothyroxine  50 mcg Oral QAC breakfast   pantoprazole  40 mg Oral Daily   senna-docusate  2 tablet Oral QHS   Continuous Infusions:  sodium chloride 75 mL/hr at 12/27/21 2215   cefTRIAXone (ROCEPHIN)  IV       LOS: 1 day      Florencia Reasons, MD PhD FACP [facility]  Available via Epic secure chat 7am-7pm for nonurgent issues Please page for urgent issues To page the attending provider between 7A-7P or the covering provider during after hours 7P-7A, please log into the web site [email or web address] and access using universal [place] password for that web site. If you do not have the password, please call the hospital operator.    12/28/2021, 3:17 PM

## 2021-12-29 ENCOUNTER — Encounter: Payer: Self-pay | Admitting: General Practice

## 2021-12-29 LAB — BASIC METABOLIC PANEL
Anion gap: 7 (ref 5–15)
BUN: 12 mg/dL (ref 6–20)
CO2: 23 mmol/L (ref 22–32)
Calcium: 9 mg/dL (ref 8.9–10.3)
Chloride: 110 mmol/L (ref 98–111)
Creatinine, Ser: 0.96 mg/dL (ref 0.44–1.00)
GFR, Estimated: >60 mL/min (ref >60)
Glucose, Bld: 85 mg/dL (ref 70–99)
Potassium: 5.3 mmol/L — ABNORMAL HIGH (ref 3.5–5.1)
Sodium: 140 mmol/L (ref 135–145)

## 2021-12-29 LAB — CBC
HCT: 36.3 % (ref 36.0–46.0)
Hemoglobin: 11 g/dL — ABNORMAL LOW (ref 12.0–15.0)
MCH: 27.4 pg (ref 26.0–34.0)
MCHC: 30.3 g/dL (ref 30.0–36.0)
MCV: 90.3 fL (ref 80.0–100.0)
Platelets: 130 10*3/uL — ABNORMAL LOW (ref 150–400)
RBC: 4.02 MIL/uL (ref 3.87–5.11)
RDW: 16.5 % — ABNORMAL HIGH (ref 11.5–15.5)
WBC: 93.5 10*3/uL (ref 4.0–10.5)
nRBC: 0 % (ref 0.0–0.2)

## 2021-12-29 MED ORDER — MAGIC MOUTHWASH W/LIDOCAINE: 5.0000 mL | Freq: Three times a day (TID) | ORAL | Status: DC | PRN

## 2021-12-29 MED ORDER — BACITRACIN-NEOMYCIN-POLYMYXIN OINTMENT TUBE
TOPICAL_OINTMENT | Freq: Two times a day (BID) | CUTANEOUS | Status: DC
  Filled 2021-12-29: qty 14.17

## 2021-12-29 NOTE — Progress Notes (Signed)
PROGRESS NOTE  Morgan [name]  DOB: 07-09-1966  PCP: Darreld Mclean, MD XIP:[id number]  DOA: 12/27/2021  LOS: 2 days  Hospital Day: 3  Brief narrative: Morgan [name] is a 56 y.o. female with PMH significant for CLL diagnosis Janurary 2018 currently RAI stage 1 s/p ibrutinib plus rituximab treatment, currently on monitoring, asthma, bipolar disorder, prior alcohol abuse, IBS, hypothyroidism. 1 week prior to presentation, patient got a spider bite on her left thigh causing localized cellulitis she took a course of clindamycin for about a week with resolution of the cellulitis.  Few days ago prior to presentation, while on clindamycin, while walking on a park, patient had muscular bites in her right thigh.  Within 24 hours of the event, she started having redness, pain and streaking up her thigh.  The areas of bites started to form tiny bullae.  She had low-grade fever, chills, fatigue, nausea.  She presented to an urgent care center and was referred to our ED where she presented on 6/10.  She was admitted to hospitalist service  Subjective: Patient was seen and examined this morning.  Pleasant middle-aged Caucasian female.  Sitting up in bed.  Showing bilateral lower extremities with healing and healed scabs from mosquito/spider bites. Patient overall feels more lousy.  She thinks she is having a flareup of her leukemia. Reviewed complaining of sore throat as well.  Principal Problem:   Cellulitis    Assessment and plan: Cellulitis of Right thigh/erysipelas -Probably started after muscular bites of the right thigh few days ago.  He has multiple visible lesions on the right thigh with small blood-filled bullae.  The surrounding area of cellulitis seems to be improving.  Currently on IV Rocephin.     CLL -RAI stage I  -most recent medication stopped due to intolerance per heme note  -patient noted only fatigue  -has chronic leukocytosis but now significantly above baseline, oncology  Dr Alen Blew recommend treat infection and outpatient follow up with [facility]  -repeat cbc with differential tomorrow.  Anxiety -Patient states he does not want to try any anxiety medicines.  She states has been trying to cope with it with strong willpower and other natural measures  Goals of care   Code Status: Full Code    Mobility: Encourage ambulation  Skin assessment:     Nutritional status:  Body mass index is 32.18 kg/m.          Diet:  Diet Order             Diet regular Room service appropriate? Yes; Fluid consistency: Thin  Diet effective now                   DVT prophylaxis:  heparin injection 5,000 Units Start: 12/27/21 2215   Antimicrobials: IV Rocephin Fluid: None Consultants: None  Family Communication: None at bedside  Status is: Inpatient  Continue in-hospital care because: Needs IV antibiotics, clinical monitoring in an immunocompromised patient Level of care: Progressive   Dispo: The patient is from: Home              Anticipated d/c is to: Hopefully home in 1 to 2 days              Patient currently is not medically stable to d/c.   Difficult to place patient No     Infusions:   cefTRIAXone (ROCEPHIN)  IV 1 g (12/28/21 1847)    Scheduled Meds:  Chlorhexidine Gluconate Cloth  6 each Topical Daily  ferrous sulfate  325 mg Oral Q breakfast   fluticasone furoate-vilanterol  1 puff Inhalation Daily   folic acid  1 mg Oral Daily   heparin  5,000 Units Subcutaneous Q8H   levothyroxine  50 mcg Oral QAC breakfast   neomycin-bacitracin-polymyxin   Topical BID   pantoprazole  40 mg Oral Daily   senna-docusate  2 tablet Oral QHS    PRN meds: acetaminophen, albuterol, aspirin-acetaminophen-caffeine, EPINEPHrine, hydrOXYzine, hyoscyamine, magic mouthwash w/lidocaine, ondansetron, sodium chloride flush, SUMAtriptan   Antimicrobials: Anti-infectives (From admission, onward)    Start     Dose/Rate Route Frequency Ordered Stop    12/28/21 1730  cefTRIAXone (ROCEPHIN) 1 g in sodium chloride 0.9 % 100 mL IVPB       Note to Pharmacy: Prior to administration will need benadryl premedication   1 g 200 mL/hr over 30 Minutes Intravenous Every 24 hours 12/27/21 2127 01/02/22 1729   12/27/21 1645  cefTRIAXone (ROCEPHIN) 1 g in sodium chloride 0.9 % 100 mL IVPB        1 g 200 mL/hr over 30 Minutes Intravenous  Once 12/27/21 1639 12/27/21 1814   12/27/21 1600  dalbavancin (DALVANCE) 1,500 mg in dextrose 5 % 500 mL IVPB  Status:  Discontinued        1,500 mg 967.7 mL/hr over 31 Minutes Intravenous Once 12/27/21 1555 12/27/21 1559       Objective: Vitals:   12/29/21 0755 12/29/21 1230  BP:  138/77  Pulse:  72  Resp:  20  Temp:  97.9 F (36.6 C)  SpO2: 96% 99%    Intake/Output Summary (Last 24 hours) at 12/29/2021 1435 Last data filed at 12/29/2021 0828 Gross per 24 hour  Intake 1240.68 ml  Output --  Net 1240.68 ml   Filed Weights   12/27/21 1501 12/28/21 0500 12/29/21 0500  Weight: 81.2 kg 81.2 kg 82.4 kg   Weight change: 1.206 kg Body mass index is 32.18 kg/m.   Physical Exam: General exam: Pleasant, middle-aged female. Skin: No rashes, lesions or ulcers. HEENT: Atraumatic, normocephalic, no obvious bleeding Lungs: Clear to auscultate bilaterally CVS: Regular rate and rhythm, no murmur GI/Abd soft, nontender, nondistended, bowel sound present CNS: Alert, awake, oriented x3 Psychiatry: Mood appropriate Extremities: No pedal edema, no calf tenderness.  Right distal thigh with few spots of blood-filled vesicles and small bullae  Data Review: I have personally reviewed the laboratory data and studies available.  F/u labs ordered Unresulted Labs (From admission, onward)     Start     Ordered   12/30/21 9233  Basic metabolic panel  Tomorrow morning,   R       Question:  Specimen collection method  Answer:  IV Team=IV Team collect   12/29/21 0949   12/30/21 0500  CBC with Differential/Platelet   Tomorrow morning,   R       Question:  Specimen collection method  Answer:  IV Team=IV Team collect   12/29/21 0949   12/27/21 1657  Pathologist smear review  Once,   R        12/27/21 1657            Signed, Terrilee Croak, MD [facility] 12/29/2021

## 2021-12-29 NOTE — TOC Initial Note (Signed)
Transition of Care [facility]) - Initial/Assessment Note    Patient Details  Name: Morgan [name] MRN: [id number] Date of Birth: December 21, 1965  Transition of [facility]) CM/SW Contact:    Leeroy Cha, RN Phone Number: 12/29/2021, 7:49 AM  Clinical Narrative:                  Transition of Care [facility]) Screening Note   Patient Details  Name: Morgan [name] Date of Birth: Dec 17, 1965   Transition of Care [facility]) CM/SW Contact:    Leeroy Cha, RN Phone Number: 12/29/2021, 7:49 AM    Transition of Care Department [facility]) has reviewed patient and no TOC needs have been identified at this time. We will continue to monitor patient advancement through interdisciplinary progression rounds. If new patient transition needs arise, please place a TOC consult.    Expected Discharge Plan: Home/Self Care Barriers to Discharge: Continued Medical Work up   Patient Goals and CMS Choice Patient states their goals for this hospitalization and ongoing recovery are:: to go home CMS Medicare.gov Compare Post Acute Care list provided to:: Patient Choice offered to / list presented to : Patient  Expected Discharge Plan and Services Expected Discharge Plan: Home/Self Care   Discharge Planning Services: CM Consult   Living arrangements for the past 2 months: Single Family Home                                      Prior Living Arrangements/Services Living arrangements for the past 2 months: Single Family Home Lives with:: Self   Do you feel safe going back to the place where you live?: Yes               Activities of Daily Living Home Assistive Devices/Equipment: Eyeglasses ADL Screening (condition at time of admission) Patient's cognitive ability adequate to safely complete daily activities?: Yes Is the patient deaf or have difficulty hearing?: No Does the patient have difficulty seeing, even when wearing glasses/contacts?: No Does the patient have difficulty concentrating,  remembering, or making decisions?: No Patient able to express need for assistance with ADLs?: Yes Does the patient have difficulty dressing or bathing?: No Independently performs ADLs?: Yes (appropriate for developmental age) Does the patient have difficulty walking or climbing stairs?: No Weakness of Legs: None Weakness of Arms/Hands: None  Permission Sought/Granted                  Emotional Assessment Appearance:: Appears stated age     Orientation: : Oriented to Self, Oriented to Place, Oriented to  Time, Oriented to Situation Alcohol / Substance Use: Not Applicable Psych Involvement: No (comment)  Admission diagnosis:  Cellulitis [L03.90] Cellulitis, unspecified cellulitis site [L03.90] Patient Active Problem List   Diagnosis Date Noted   Cellulitis 12/27/2021   Throat discomfort 03/12/2020   Hypothyroidism 11/18/2018   Fever 06/18/2018   Piriformis syndrome of right side 04/22/2017   Lateral epicondylitis of left elbow 04/06/2017   Solitary pulmonary nodule 10/29/2016   Chronic lymphocytic leukemia ([place]) 07/31/2016   Mass in neck 04/17/2016   Abdominal mass 04/17/2016   Asthma 12/12/2015   Multiple environmental allergies 12/12/2015   Chronic lower back pain 09/12/2015   Bipolar 1 disorder, depressed ([place]) 08/27/2015    Class: Chronic   Borderline personality disorder ([place]) 08/15/2015   Severe benzodiazepine use disorder ([place]) 08/15/2015   Alcohol use disorder, moderate, dependence ([place]) 08/15/2015   PTSD (  post-traumatic stress disorder) 08/15/2015   History of bipolar disorder 08/15/2015   Pre-syncope 05/17/2015   Skull deformity 05/17/2015   Patellofemoral syndrome of both knees 05/02/2015   PCP:  Darreld Mclean, MD Pharmacy:   [facility] [address] [place], [place] - [place] [address] Phone: [phone] Fax: [id number] - [place], [place] AT  [facility] [name] [address] Phone: [phone] Fax: [phone]  [id number] [place], [address] AT [place] [address] Phone: [phone] Fax: [phone]  [facility] [phone] - [place], [place] - [address] AT [place] [address] [place] [address] Phone: [phone] Fax: [phone]  [facility] #[id number] [place], [place] - [address] AT [place] [address] Phone: [phone] Fax: [phone]  [facility] [phone] - [place], [place] - [place] AT [address] [address] Phone: [phone] Fax: [phone]     Social Determinants of Health (SDOH) Interventions    Readmission Risk Interventions     No data to display

## 2021-12-29 NOTE — Progress Notes (Signed)
[name] Spiritual Care Note  Followed up with Morgan [name] briefly by phone, but interruptions limited the call. She is working to process her current illness and admission, which contrast with how well she had been feeling recently, leading to feelings of disappointment and worry about her health.  Morgan [name] needed to take a call and plans to reach out again at a better time.   [place], [place], [facility] Pager [phone] Voicemail [phone]

## 2021-12-29 NOTE — Progress Notes (Signed)
[place] Spiritual Care Note  Was able to spend an uninterrupted hour with Morgan [name] while she is inpatient. Between this hospitalization and several recent deaths of people close to her, mortality is particularly on her mind right now. Through the course of reflective, discerning conversation, Morgan [name] has identified that beginning to plan her celebration of life is an important developmental and faith task for her. She already has songs and Scripture passages in mind, and she plans to do some more process-oriented writing about other details. She has asked me to offer her eulogy when the time comes, which of course I will do my best to do.  Provided empathic listening, pastoral reflection, normalization of feelings, and affirmation of strengths/gifts. Morgan [name] plans to follow up as needed/desired.   [place] [address], [place], [facility] Pager [phone] Voicemail [phone]

## 2021-12-29 NOTE — Progress Notes (Signed)
[facility] Spiritual Care Note  Received page requesting chaplain while covering [facility]. Well acquainted with Morgan [name] from our connection at [facility].   Morgan [name] is currently tied up on the phone with her daughter, so we plan to follow up by phone or in person later in the day, depending on how she is feeling.   [place], [place], [facility] M-F daytime pager [phone] [facility] 24/7 pager [phone] Voicemail [phone]

## 2021-12-30 LAB — CBC WITH DIFFERENTIAL/PLATELET
Abs Immature Granulocytes: 0.35 10*3/uL — ABNORMAL HIGH (ref 0.00–0.07)
Basophils Absolute: 0.6 10*3/uL — ABNORMAL HIGH (ref 0.0–0.1)
Basophils Relative: 1 %
Eosinophils Absolute: 0.5 10*3/uL (ref 0.0–0.5)
Eosinophils Relative: 1 %
HCT: 35.4 % — ABNORMAL LOW (ref 36.0–46.0)
Hemoglobin: 10.9 g/dL — ABNORMAL LOW (ref 12.0–15.0)
Immature Granulocytes: 0 %
Lymphocytes Relative: 87 %
Lymphs Abs: 94.8 10*3/uL — ABNORMAL HIGH (ref 0.7–4.0)
MCH: 27.6 pg (ref 26.0–34.0)
MCHC: 30.8 g/dL (ref 30.0–36.0)
MCV: 89.6 fL (ref 80.0–100.0)
Monocytes Absolute: 8.8 10*3/uL — ABNORMAL HIGH (ref 0.1–1.0)
Monocytes Relative: 8 %
Neutro Abs: 2.6 10*3/uL (ref 1.7–7.7)
Neutrophils Relative %: 3 %
Platelets: 129 10*3/uL — ABNORMAL LOW (ref 150–400)
RBC: 3.95 MIL/uL (ref 3.87–5.11)
RDW: 16.3 % — ABNORMAL HIGH (ref 11.5–15.5)
WBC: 107.6 10*3/uL (ref 4.0–10.5)
nRBC: 0 % (ref 0.0–0.2)

## 2021-12-30 LAB — BASIC METABOLIC PANEL
Anion gap: 3 — ABNORMAL LOW (ref 5–15)
BUN: 21 mg/dL — ABNORMAL HIGH (ref 6–20)
CO2: 26 mmol/L (ref 22–32)
Calcium: 9.2 mg/dL (ref 8.9–10.3)
Chloride: 112 mmol/L — ABNORMAL HIGH (ref 98–111)
Creatinine, Ser: 1.22 mg/dL — ABNORMAL HIGH (ref 0.44–1.00)
GFR, Estimated: 52 mL/min — ABNORMAL LOW (ref >60)
Glucose, Bld: 90 mg/dL (ref 70–99)
Potassium: 5.1 mmol/L (ref 3.5–5.1)
Sodium: 141 mmol/L (ref 135–145)

## 2021-12-30 LAB — PATHOLOGIST SMEAR REVIEW

## 2021-12-30 MED ORDER — SODIUM CHLORIDE 0.9 % IV SOLN: INTRAVENOUS | Status: DC

## 2021-12-30 NOTE — Care Management Important Message (Signed)
Important Message  Patient Details IM Letter given to the Patient. Name: Morgan [name] MRN: [id number] Date of Birth: [birth year]   Medicare Important Message Given:  Yes     Kerin Salen 12/30/2021, 10:45 AM

## 2021-12-30 NOTE — Progress Notes (Signed)
PROGRESS NOTE  Morgan [name]  DOB: 01-May-1966  PCP: Darreld Mclean, MD QQV:[id number]  DOA: 12/27/2021  LOS: 3 days  Hospital Day: 4  Brief narrative: Morgan [name] is a 56 y.o. female with PMH significant for CLL diagnosis Janurary 2018 currently RAI stage 1 s/p ibrutinib plus rituximab treatment, currently on monitoring, asthma, bipolar disorder, prior alcohol abuse, IBS, hypothyroidism. 1 week prior to presentation, patient got a spider bite on her left thigh causing localized cellulitis she took a course of clindamycin for about a week with resolution of the cellulitis.  Few days ago prior to presentation, while on clindamycin, while walking on a park, patient had muscular bites in her right thigh.  Within 24 hours of the event, she started having redness, pain and streaking up her thigh.  The areas of bites started to form tiny bullae.  She had low-grade fever, chills, fatigue, nausea.  She presented to an urgent care center and was referred to our ED where she presented on 6/10.  She was admitted to hospitalist service  Subjective: Patient was seen and examined this morning.  Sitting up in chair.  Cellulitis improving in right thigh. She states that her lymph nodes in the neck are enlarging.   Labs from this morning with improvement in potassium but worsening creatinine and WBC count.    Principal Problem:   Cellulitis    Assessment and plan: Cellulitis of Right thigh/erysipelas -Probably started after muscular bites of the right thigh few days ago.  He has multiple visible lesions on the right thigh with small blood-filled bullae.  The surrounding area of cellulitis seems to be currently improving on IV Rocephin.  I will continue the same for now.   CLL Leukocytosis -RAI stage I  -most recent medication stopped due to intolerance per heme note  -patient noted only fatigue  -has chronic leukocytosis but now significantly above baseline. -Previous hospitalist checked  with oncology Dr Alen Blew recommend treat infection and outpatient follow up with [facility]  -Leukocytosis steadily worsening.  WBC count 108K today. -I called and left a message for patient's oncologist Dr. Lanette Hampshire at [facility] [phone]). -repeat cbc with differential tomorrow.  Elevated creatinine -Creatinine slightly elevated than baseline.  Start on IV hydration today. Recent Labs    08/27/21 1507 10/14/21 1307 12/27/21 1657 12/28/21 0330 12/29/21 0404 12/30/21 0332  BUN '13 13 13 11 12 '$ 21*  CREATININE 1.07 1.13 1.03* 0.87 0.96 1.22*   Hyperkalemia -Potassium level was elevated 5.3 yesterday.  Improving Recent Labs  Lab 12/27/21 1657 12/28/21 0330 12/29/21 0404 12/30/21 0332  K 4.0 4.9 5.3* 5.1  MG 2.6*  --   --   --   PHOS 3.7  --   --   --    Anxiety -Patient states he does not want to try any anxiety medicines.  She states has been trying to cope with it with strong willpower and other natural measures  Goals of care   Code Status: Full Code    Mobility: Encourage ambulation  Skin assessment:     Nutritional status:  Body mass index is 32.18 kg/m.          Diet:  Diet Order             Diet regular Room service appropriate? Yes; Fluid consistency: Thin  Diet effective now                   DVT prophylaxis:  Place and maintain sequential compression device  Start: 12/29/21 1557 heparin injection 5,000 Units Start: 12/27/21 2215   Antimicrobials: IV Rocephin Fluid: None Consultants: None  Family Communication: None at bedside  Status is: Inpatient  Continue in-hospital care because: Elevated creatinine, enlarged lymph nodes  level of care: Progressive   Dispo: The patient is from: Home              Anticipated d/c is to: Hopefully home in 1 to 2 days              Patient currently is not medically stable to d/c.   Difficult to place patient No     Infusions:   sodium chloride 75 mL/hr at 12/30/21 1253   cefTRIAXone  (ROCEPHIN)  IV 1 g (12/29/21 1753)    Scheduled Meds:  Chlorhexidine Gluconate Cloth  6 each Topical Daily   ferrous sulfate  325 mg Oral Q breakfast   fluticasone furoate-vilanterol  1 puff Inhalation Daily   folic acid  1 mg Oral Daily   heparin  5,000 Units Subcutaneous Q8H   levothyroxine  50 mcg Oral QAC breakfast   neomycin-bacitracin-polymyxin   Topical BID   pantoprazole  40 mg Oral Daily   senna-docusate  2 tablet Oral QHS    PRN meds: acetaminophen, albuterol, aspirin-acetaminophen-caffeine, EPINEPHrine, hydrOXYzine, hyoscyamine, magic mouthwash w/lidocaine, ondansetron, sodium chloride flush, SUMAtriptan   Antimicrobials: Anti-infectives (From admission, onward)    Start     Dose/Rate Route Frequency Ordered Stop   12/28/21 1730  cefTRIAXone (ROCEPHIN) 1 g in sodium chloride 0.9 % 100 mL IVPB       Note to Pharmacy: Prior to administration will need benadryl premedication   1 g 200 mL/hr over 30 Minutes Intravenous Every 24 hours 12/27/21 2127 01/02/22 1729   12/27/21 1645  cefTRIAXone (ROCEPHIN) 1 g in sodium chloride 0.9 % 100 mL IVPB        1 g 200 mL/hr over 30 Minutes Intravenous  Once 12/27/21 1639 12/27/21 1814   12/27/21 1600  dalbavancin (DALVANCE) 1,500 mg in dextrose 5 % 500 mL IVPB  Status:  Discontinued        1,500 mg 967.7 mL/hr over 31 Minutes Intravenous Once 12/27/21 1555 12/27/21 1559       Objective: Vitals:   12/30/21 0537 12/30/21 1310  BP: 107/64 (!) 110/59  Pulse: 67 73  Resp: 18 19  Temp: 98 F (36.7 C) 97.6 F (36.4 C)  SpO2: 94% 98%    Intake/Output Summary (Last 24 hours) at 12/30/2021 1447 Last data filed at 12/30/2021 0900 Gross per 24 hour  Intake 480 ml  Output --  Net 480 ml   Filed Weights   12/27/21 1501 12/28/21 0500 12/29/21 0500  Weight: 81.2 kg 81.2 kg 82.4 kg   Weight change:  Body mass index is 32.18 kg/m.   Physical Exam: General exam: Pleasant, middle-aged female. Skin: No rashes, lesions or  ulcers. HEENT: Atraumatic, normocephalic, no obvious bleeding.  Some nontender lymph nodes palpable in submandibular area bilaterally Lungs: Clear to auscultate bilaterally CVS: Regular rate and rhythm, no murmur GI/Abd soft, nontender, nondistended, bowel sound present CNS: Alert, awake, oriented x3 Psychiatry: Mood appropriate Extremities: No pedal edema, no calf tenderness.  Right distal thigh with few spots of blood-filled vesicles and small bullae  Data Review: I have personally reviewed the laboratory data and studies available.  F/u labs ordered Unresulted Labs (From admission, onward)    None       Signed, Terrilee Croak, MD [facility] 12/30/2021

## 2021-12-31 LAB — CBC WITH DIFFERENTIAL/PLATELET
Abs Immature Granulocytes: 0.26 10*3/uL — ABNORMAL HIGH (ref 0.00–0.07)
Basophils Absolute: 0.2 10*3/uL — ABNORMAL HIGH (ref 0.0–0.1)
Basophils Relative: 0 %
Eosinophils Absolute: 0.5 10*3/uL (ref 0.0–0.5)
Eosinophils Relative: 0 %
HCT: 34.3 % — ABNORMAL LOW (ref 36.0–46.0)
Hemoglobin: 10.5 g/dL — ABNORMAL LOW (ref 12.0–15.0)
Immature Granulocytes: 0 %
Lymphocytes Relative: 89 %
Lymphs Abs: 91.5 10*3/uL — ABNORMAL HIGH (ref 0.7–4.0)
MCH: 27.3 pg (ref 26.0–34.0)
MCHC: 30.6 g/dL (ref 30.0–36.0)
MCV: 89.1 fL (ref 80.0–100.0)
Monocytes Absolute: 8.8 10*3/uL — ABNORMAL HIGH (ref 0.1–1.0)
Monocytes Relative: 9 %
Neutro Abs: 2.3 10*3/uL (ref 1.7–7.7)
Neutrophils Relative %: 2 %
Platelets: 134 10*3/uL — ABNORMAL LOW (ref 150–400)
RBC: 3.85 MIL/uL — ABNORMAL LOW (ref 3.87–5.11)
RDW: 16.3 % — ABNORMAL HIGH (ref 11.5–15.5)
WBC: 103.5 10*3/uL (ref 4.0–10.5)
nRBC: 0 % (ref 0.0–0.2)

## 2021-12-31 LAB — BASIC METABOLIC PANEL
Anion gap: 5 (ref 5–15)
BUN: 20 mg/dL (ref 6–20)
CO2: 25 mmol/L (ref 22–32)
Calcium: 8.9 mg/dL (ref 8.9–10.3)
Chloride: 113 mmol/L — ABNORMAL HIGH (ref 98–111)
Creatinine, Ser: 0.98 mg/dL (ref 0.44–1.00)
GFR, Estimated: >60 mL/min (ref >60)
Glucose, Bld: 93 mg/dL (ref 70–99)
Potassium: 4.9 mmol/L (ref 3.5–5.1)
Sodium: 143 mmol/L (ref 135–145)

## 2021-12-31 MED ORDER — CEFDINIR 300 MG PO CAPS
300.0000 mg | ORAL_CAPSULE | Freq: Two times a day (BID) | ORAL | Status: DC
Start: 2021-12-31 — End: 2021-12-31
  Filled 2021-12-31: qty 1

## 2021-12-31 MED ORDER — HEPARIN SOD (PORK) LOCK FLUSH 100 UNIT/ML IV SOLN
500.0000 [IU] | INTRAVENOUS | Status: AC | PRN
  Administered 2021-12-31: 500 [IU]
  Filled 2021-12-31: qty 5

## 2021-12-31 MED ORDER — HYDROCORTISONE 1 % EX CREA
TOPICAL_CREAM | Freq: Two times a day (BID) | CUTANEOUS | Status: DC
  Filled 2021-12-31: qty 28

## 2021-12-31 MED ORDER — CEFDINIR 300 MG PO CAPS: 300.0000 mg | ORAL_CAPSULE | Freq: Two times a day (BID) | ORAL | 0 refills | Status: DC

## 2021-12-31 NOTE — Progress Notes (Addendum)
Provided pt with discharge instructions. Pt verbalized understanding with teach back. Pt reports she will access records on MyChart account. Shredded paper copy of AVS per pt request.

## 2021-12-31 NOTE — TOC Transition Note (Signed)
Transition of Care [facility]) - CM/SW Discharge Note   Patient Details  Name: Morgan [name] MRN: [id number] Date of Birth: Jul 27, 1965  Transition of Care [facility]) CM/SW Contact:  Leeroy Cha, RN Phone Number: 12/31/2021, 12:45 PM   Clinical Narrative:    Patient dcd orders checked no toc needs present.   Final next level of care: Home/Self Care Barriers to Discharge: Barriers Resolved   Patient Goals and CMS Choice Patient states their goals for this hospitalization and ongoing recovery are:: to go home CMS Medicare.gov Compare Post Acute Care list provided to:: Patient Choice offered to / list presented to : Patient  Discharge Placement                       Discharge Plan and Services   Discharge Planning Services: CM Consult                                 Social Determinants of Health (SDOH) Interventions     Readmission Risk Interventions     No data to display

## 2021-12-31 NOTE — Discharge Summary (Signed)
Physician Discharge Summary   Patient: Morgan [name] MRN: [id number] DOB: Nov 28, 1965  Admit date:     12/27/2021  Discharge date: 12/31/21  Discharge Physician: Raiford Noble, DO   PCP: Darreld Mclean, MD   Recommendations at discharge:   Follow-up with PCP within 1 to 2 weeks and repeat CBC, CMP, mag, Phos within 1 week Follow-up with medical oncology Dr. Dorothea Ogle at [facility] within 1 week  Discharge Diagnoses: Principal Problem:   Cellulitis  Resolved Problems:   * No resolved hospital problems. [facility] Course: Brief narrative: Morgan [name] is a 56 y.o. female with PMH significant for CLL diagnosis Janurary 2018 currently RAI stage 1 s/p ibrutinib plus rituximab treatment, currently on monitoring, asthma, bipolar disorder, prior alcohol abuse, IBS, hypothyroidism. 1 week prior to presentation, patient got a spider bite on her left thigh causing localized cellulitis she took a course of clindamycin for about a week with resolution of the cellulitis.  Few days ago prior to presentation, while on clindamycin, while walking on a park, patient had muscular bites in her right thigh.  Within 24 hours of the event, she started having redness, pain and streaking up her thigh.  The areas of bites started to form tiny bullae.  She had low-grade fever, chills, fatigue, nausea.  She presented to an urgent care center and was referred to our ED where she presented on 6/10.   She was admitted to hospitalist service and was treated with IV antibiotics of ceftriaxone.  Her cellulitis started improving when her WBC elevated but is now stable and trended back down.  She is medically stable to be discharged on oral antibiotics and will need to follow-up with her PCP and her primary medical oncologist in outpatient setting.  She did have an AKI while she was hospitalized which is improved and resolved now.  Assessment and Plan:  Cellulitis of Right thigh/erysipelas -Probably started after muscular  bites of the right thigh few days ago.  He has multiple visible lesions on the right thigh with small blood-filled bullae.  The surrounding area of cellulitis seems to be currently improving on IV Rocephin and will change to p.o. cefdinir at discharge for 5 more days -This is improving   CLL Leukocytosis -RAI stage I  -most recent medication stopped due to intolerance per heme note  -patient noted only fatigue  -has chronic leukocytosis but now significantly above baseline. -Previous hospitalist checked with oncology Dr Alen Blew recommend treat infection and outpatient follow up with [facility]  -Leukocytosis steadily worsening.  WBC count 108K today. -Dr. Pietro Cassis called and left a message for patient's oncologist Dr. Lanette Hampshire at [facility] [phone]) and he received a call back today and they had no further recommendations. -WBC is improved to 103.5 -We will transition to oral antibiotics for 5 more days of oral cefdinir which she has tolerated in the past -Follow-up in outpatient setting with PCP as well as medical oncology   Elevated creatinine -Creatinine slightly elevated than baseline.  Start on IV hydration yesterday. Recent Labs (within last 365 days)          Recent Labs    08/27/21 1507 10/14/21 1307 12/27/21 1657 12/28/21 0330 12/29/21 0404 12/30/21 0332  BUN '13 13 13 11 12 '$ 21*  CREATININE 1.07 1.13 1.03* 0.87 0.96 1.22*    -Now her BUNs/creatinine is resolved and 20/0.98 -Continue monitor and trend and repeat CMP within 1 week  Hyperkalemia -Potassium level was elevated 5.3 the day before yesterday.  Improving  Last Labs         Recent Labs  Lab 12/27/21 1657 12/28/21 0330 12/29/21 0404 12/30/21 0332  K 4.0 4.9 5.3* 5.1  MG 2.6*  --   --   --   PHOS 3.7  --   --   --      -Now her potassium at the time of discharge was 4.9  Anxiety -Patient states he does not want to try any anxiety medicines.  She states has been trying to cope with it with strong  willpower and other natural measures  Thrombocytopenia -Mild and patient's platelet count went from 130 and is now 134 -Continue monitor and trend and quantitative monitor for signs and symptoms bleeding no overt bleeding noted -Repeat CBC within 1 week  Normocytic anemia -Likely dilutional drop -Patient's hemoglobin/hematocrit is now 10.5/34.3 -Continue monitor and trend and repeat CBC in outpatient setting within 1 week at PCP office in oncology office  Obesity -Complicates overall prognosis and care -Estimated body mass index is 32.18 kg/m as calculated from the following:   Height as of this encounter: '5\' 3"'$  (1.6 m).   Weight as of this encounter: 82.4 kg.  -Weight Loss and Dietary Counseling given  Consultants: Medical Oncology  Procedures performed: None Disposition: Home Diet recommendation:  Discharge Diet Orders (From admission, onward)     Start     Ordered   12/31/21 0000  Diet - low sodium heart healthy        12/31/21 1231           Cardiac diet DISCHARGE MEDICATION: Allergies as of 12/31/2021       Reactions   Aripiprazole Anaphylaxis, Swelling   Ciprofloxacin Shortness Of Breath   Lamotrigine Rash, Other (See Comments)   Nitrofurantoin Hives   Other Anaphylaxis, Hives, Swelling   Walnuts and pine nuts   Peanut Oil Anaphylaxis, Hives, Swelling, Rash   Amoxicillin Hives, Rash   Update - pt states she has been tested by her allergist and is no longer allergic to penicillin 03/2021   Doxycycline Hives, Rash   Latex Rash, Other (See Comments)   Meloxicam Itching, Other (See Comments)   Induces mania Interacts with Lithium   Naproxen Hives, Other (See Comments)   Interacts with lithium   Pramipexole Hives, Rash   Prednisone Other (See Comments)   Interacts with Lithium   Risperidone Hives, Rash   Sulfa Antibiotics Rash   "that leaves scarring"   Tape Itching, Rash   Steri-StripsT   Cortisone Other (See Comments)   Exacerbates the mania of her  bipolar   Keflex [cephalexin]    Pt developed a rash after taking high dose keflex for several days    Percocet [oxycodone-acetaminophen] Other (See Comments)   Severe dizziness   Shingrix [zoster Vac Recomb Adjuvanted] Swelling   rash   Vancomycin Itching   Itching on head and at IV insertion site where Vanc running   Cuvitru [immune Globulin (human)] Rash   Also allergic to Hizentra        Medication List     STOP taking these medications    clindamycin 300 MG capsule Commonly known as: Cleocin       TAKE these medications    acetaminophen 500 MG tablet Commonly known as: TYLENOL Take 1,000 mg by mouth every 6 (six) hours as needed for mild pain.   albuterol 108 (90 Base) MCG/ACT inhaler Commonly known as: VENTOLIN HFA Inhale 1-2 puffs into the lungs every 6 (six) hours as  needed for wheezing or shortness of breath.   cefdinir 300 MG capsule Commonly known as: OMNICEF Take 1 capsule (300 mg total) by mouth every 12 (twelve) hours for 5 days.   diphenhydrAMINE 25 MG tablet Commonly known as: BENADRYL Take 1 tablet (25 mg total) by mouth every 6 (six) hours as needed for itching. What changed: additional instructions   EPINEPHrine 0.3 mg/0.3 mL Soaj injection Commonly known as: EPI-PEN Inject 0.3 mLs (0.3 mg total) into the muscle as needed for anaphylaxis.   Excedrin Extra Strength 250-250-65 MG tablet Generic drug: aspirin-acetaminophen-caffeine Take 2 tablets by mouth every 6 (six) hours as needed for headache.   fexofenadine 180 MG tablet Commonly known as: ALLEGRA Take 180 mg by mouth daily.   fluticasone furoate-vilanterol 100-25 MCG/ACT Aepb Commonly known as: BREO ELLIPTA Inhale 1 puff into the lungs daily. What changed: Another medication with the same name was removed. Continue taking this medication, and follow the directions you see here.   Gamunex-C 10 GM/100ML Soln Generic drug: Immune Globulin 10% Inject into the vein. Every 3 weeks    hyoscyamine 0.125 MG tablet Commonly known as: LEVSIN Take 1 tablet (0.125 mg total) by mouth as needed for cramping. Reported on 09/24/2015   ibuprofen 200 MG tablet Commonly known as: ADVIL Take 200 mg by mouth every 6 (six) hours as needed (inflammation).   levothyroxine 50 MCG tablet Commonly known as: SYNTHROID Take 1 tablet (50 mcg total) by mouth daily before breakfast.   lidocaine-prilocaine cream Commonly known as: EMLA Apply 1 application  topically as needed (access port).   ondansetron 8 MG tablet Commonly known as: ZOFRAN Take by mouth every 8 (eight) hours as needed for nausea or vomiting.   rizatriptan 10 MG disintegrating tablet Commonly known as: MAXALT-MLT Take 1 tablet (10 mg total) by mouth as needed for migraine. May repeat in 2 hours if needed        Follow-up Information     Copland, Gay Filler, MD Follow up.   Specialty: Family Medicine Why: Follow up within 1-2 weeks Contact information: [address] [place] [address] [phone]         Lanette Hampshire, MD. Call.   Specialty: Internal Medicine Why: Follow up within 1-2 weeks Contact information: [place] [address] [phone]                Discharge Exam: Filed Weights   12/27/21 1501 12/28/21 0500 12/29/21 0500  Weight: 81.2 kg 81.2 kg 82.4 kg   Vitals:   12/30/21 2151 12/31/21 0514  BP: 106/60 104/62  Pulse: 75 63  Resp: 20 20  Temp: 97.6 F (36.4 C) 97.6 F (36.4 C)  SpO2: 99% 100%   Examination: Physical Exam:  Constitutional: WN/WD obese Caucasian female, in no acute distress appears anxious Respiratory: Diminished to auscultation bilaterally, no wheezing, rales, rhonchi or crackles. Normal respiratory effort and patient is not tachypenic. No accessory muscle use.  Unlabored breathing Cardiovascular: RRR, no murmurs / rubs / gallops. S1 and S2 auscultated. No extremity edema  Abdomen: Soft, non-tender, distended  secondary body habitus bowel sounds positive.  GU: Deferred. Musculoskeletal: No clubbing / cyanosis of digits/nails.  No joint deformities noted Skin: Her cellulitis is improving on her right leg and she does have some slight bullae on her right leg and one lesion on her left leg Neurologic: CN 2-12 grossly intact with no focal deficits.  Romberg sign cerebellar reflexes not assessed.  Psychiatric:  Normal judgment and insight. Alert and oriented x 3. Normal mood and appropriate affect.    Condition at discharge: stable  The results of significant diagnostics from this hospitalization (including imaging, microbiology, ancillary and laboratory) are listed below for reference.   Imaging Studies: DG Chest Portable 1 View  Result Date: 12/27/2021 CLINICAL DATA:  Left leg cellulitis. EXAM: PORTABLE CHEST 1 VIEW COMPARISON:  December 24, 2020 FINDINGS: There is stable right-sided venous Port-A-Cath positioning. The heart size and mediastinal contours are within normal limits. A radiopaque loop recorder device is seen. Both lungs are clear. The visualized skeletal structures are unremarkable. IMPRESSION: No active cardiopulmonary disease. Electronically Signed   By: Virgina Norfolk M.D.   On: 12/27/2021 17:00    Microbiology: Results for orders placed or performed during the hospital encounter of 12/27/21  MRSA Next Gen by PCR, Nasal     Status: None   Collection Time: 12/27/21 10:22 PM   Specimen: Nasal Mucosa; Nasal Swab  Result Value Ref Range Status   MRSA by PCR Next Gen NOT DETECTED NOT DETECTED Final    Comment: (NOTE) The GeneXpert MRSA Assay (FDA approved for NASAL specimens only), is one component of a comprehensive MRSA colonization surveillance program. It is not intended to diagnose MRSA infection nor to guide or monitor treatment for MRSA infections. Test performance is not FDA approved in patients less than 50 years old. Performed at [facility], [place] [address], [place],  [address]    Labs: CBC: Recent Labs  Lab 12/27/21 1657 12/29/21 0404 12/30/21 0332 12/31/21 0431  WBC 94.2* 93.5* 107.6* 103.5*  NEUTROABS 4.7  --  2.6 2.3  HGB 12.0 11.0* 10.9* 10.5*  HCT 37.1 36.3 35.4* 34.3*  MCV 85.7 90.3 89.6 89.1  PLT 136* 130* 129* 353*   Basic Metabolic Panel: Recent Labs  Lab 12/27/21 1657 12/28/21 0330 12/29/21 0404 12/30/21 0332 12/31/21 0431  NA 138 142 140 141 143  K 4.0 4.9 5.3* 5.1 4.9  CL 109 117* 110 112* 113*  CO2 '22 22 23 26 25  '$ GLUCOSE 86 87 85 90 93  BUN '13 11 12 '$ 21* 20  CREATININE 1.03* 0.87 0.96 1.22* 0.98  CALCIUM 9.4 7.8* 9.0 9.2 8.9  MG 2.6*  --   --   --   --   PHOS 3.7  --   --   --   --    Liver Function Tests: Recent Labs  Lab 12/28/21 0330  AST 34  ALT 15  ALKPHOS 52  BILITOT 0.4  PROT 6.4*  ALBUMIN 3.1*   CBG: No results for input(s): "GLUCAP" in the last 168 hours.  Discharge time spent: greater than 30 minutes.  Signed: Raiford Noble, DO [facility] 12/31/2021

## 2021-12-31 NOTE — TOC Progression Note (Signed)
Transition of Care [facility]) - Progression Note    Patient Details  Name: Morgan [name] MRN: [id number] Date of Birth: [birth year]  [facility]) CM/SW Contact  Leeroy Cha, RN Phone Number: 12/31/2021, 7:11 AM  Clinical Narrative:    Following for toc needs.   Expected Discharge Plan: Home/Self Care Barriers to Discharge: Continued Medical Work up  Expected Discharge Plan and Services Expected Discharge Plan: Home/Self Care   Discharge Planning Services: CM Consult   Living arrangements for the past 2 months: Single Family Home                                       Social Determinants of Health (SDOH) Interventions    Readmission Risk Interventions     No data to display

## 2022-01-01 ENCOUNTER — Other Ambulatory Visit: Payer: Self-pay

## 2022-01-01 ENCOUNTER — Ambulatory Visit (INDEPENDENT_AMBULATORY_CARE_PROVIDER_SITE_OTHER): Payer: Medicare Other | Admitting: Internal Medicine

## 2022-01-01 ENCOUNTER — Telehealth: Payer: Self-pay

## 2022-01-01 ENCOUNTER — Encounter: Payer: Self-pay | Admitting: Internal Medicine

## 2022-01-01 DIAGNOSIS — C9110 Chronic lymphocytic leukemia of B-cell type not having achieved remission: Secondary | ICD-10-CM | POA: Diagnosis not present

## 2022-01-01 DIAGNOSIS — L03115 Cellulitis of right lower limb: Secondary | ICD-10-CM | POA: Diagnosis not present

## 2022-01-01 NOTE — Assessment & Plan Note (Signed)
She has active CLL with avid LN above and below diaphragm.  Her WBC is continuing to rise.  She also had a LN biopsy recently at [facility] that was consistent with this.  She will see her hematologist for follow up soon and I recommend doing so as well.  I would have concern that the remaining lesions may be a dermatologic manifestation related to her active CLL as a WBC of over 100k seems inconsistent with cellulitis.

## 2022-01-01 NOTE — Telephone Encounter (Signed)
Transition Care Management Follow-up Telephone Call Date of discharge and from where: 12/31/2021 [facility] How have you been since you were released from the hospital? Got some good rest. This morning breathing a little labored, but will use inhaler Any questions or concerns? No  Items Reviewed: Did the pt receive and understand the discharge instructions provided? Yes  Medications obtained and verified? No  Other? No  Any new allergies since your discharge? No  Dietary orders reviewed? Yes Do you have support at home? Yes   Home Care and Equipment/Supplies: Were home health services ordered? not applicable If so, what is the name of the agency? N/a  Has the agency set up a time to come to the patient's home? not applicable Were any new equipment or medical supplies ordered?  No What is the name of the medical supply agency? N/a Were you able to get the supplies/equipment? not applicable Do you have any questions related to the use of the equipment or supplies? No  Functional Questionnaire: (I = Independent and D = Dependent) ADLs: I  Bathing/Dressing- I  Meal Prep- I  Eating- I  Maintaining continence- I  Transferring/Ambulation- I  Managing Meds- I  Follow up appointments reviewed:  PCP Hospital f/u appt confirmed? Yes  Scheduled to see Dr. Lorelei Pont on 01/14/2022 @ 2:00. Are transportation arrangements needed? No  If their condition worsens, is the pt aware to call PCP or go to the Emergency Dept.? Yes Was the patient provided with contact information for the PCP's office or ED? Yes Was to pt encouraged to call back with questions or concerns? Yes

## 2022-01-01 NOTE — Progress Notes (Signed)
[facility] for Infectious Disease  Reason for Consult: Cellulitis  Referring Provider: Dr Lorelei Pont (PCP)   HPI:    Morgan [name] is a 56 y.o. female with PMHx as below who presents to the clinic for further evaluation of  cellulitis on her right and left thigh as a work in visit today.  She has a complex past medical history and follows with specialists at [facility] for her CLL.  She has also seen ID at [facility] previously (Dr Jackquline Berlin).    I have reviewed the recent history:  Patient sustained a bug bite on left thigh on ~6/1 and sent her PCP a MyChart message 6/2 given concern for cellulitis.  She was prescribed Clindamycin in the setting of numerous antibiotic allergies.  She improved on this.  She then sent another MyChart message on 6/8 noting a temperature of 99 degrees.  She took her temperature because she felt feverish.  She then was seen on 6/10 at urgent care because of lymphangitis and erythema on her right leg from multiple mosquito bites that occurred a few days prior.  Interestingly, she reports a history of cellulitis in October from insect bites as well.  The urgent care sent her to the ED given concern for cellulitis and lack of response to clindamycin.  At admission, she was found to have a WBC of 94.2 (88% lymphocytes).  For comparision her WBC on 11/18/21 at [facility] was 37.8.  This was discussed with Dr Alen Blew whom felt that this WBC elevation was consistent with her CLL and likely exacerbated by cellulitis.  No further work up from a hematologic perspective was undertaken during her admission.  The hospitalist also called and left a message for patient's oncologist Dr. Lanette Hampshire at [facility] and they had no further recommendations after that discussion.  She was on the hospitalist service and treated her with ceftriaxone x 5 days.  She was discharged yesterday and prescribed cefdinir for 5 more days.  Her WBC at time of hospital discharge was 103.5 (89% lymphocytes).   She has  no history of tick bites and labs do not show leukopenia or elevated LFTs and her thrombocytopenia is chronic.   She called our clinic today asking for a visit because of needing a plan in place for if/when she gets cellulitis recurs again.  She reports the erythema/lymphangitis has essentially resolved.  She has some fluid filled bulla scattered on her lower extremities.  One of these bulla unroofed yesterday and had some bleeding.  She is not having any fevers.  She does report some intense itching on her right thigh and lateral knee.       Patient's Medications  New Prescriptions   No medications on file  Previous Medications   ACETAMINOPHEN (TYLENOL) 500 MG TABLET    Take 1,000 mg by mouth every 6 (six) hours as needed for mild pain.   ALBUTEROL (VENTOLIN HFA) 108 (90 BASE) MCG/ACT INHALER    Inhale 1-2 puffs into the lungs every 6 (six) hours as needed for wheezing or shortness of breath.   ASPIRIN-ACETAMINOPHEN-CAFFEINE (EXCEDRIN EXTRA STRENGTH) 250-250-65 MG TABLET    Take 2 tablets by mouth every 6 (six) hours as needed for headache.   CEFDINIR (OMNICEF) 300 MG CAPSULE    Take 1 capsule (300 mg total) by mouth every 12 (twelve) hours for 5 days.   DIPHENHYDRAMINE (BENADRYL) 25 MG TABLET    Take 1 tablet (25 mg total) by mouth every 6 (six) hours as  needed for itching.   EPINEPHRINE 0.3 MG/0.3 ML IJ SOAJ INJECTION    Inject 0.3 mLs (0.3 mg total) into the muscle as needed for anaphylaxis.   FEXOFENADINE (ALLEGRA) 180 MG TABLET    Take 180 mg by mouth daily.   FLUTICASONE FUROATE-VILANTEROL (BREO ELLIPTA) 100-25 MCG/ACT AEPB    Inhale 1 puff into the lungs daily.   HYOSCYAMINE (LEVSIN) 0.125 MG TABLET    Take 1 tablet (0.125 mg total) by mouth as needed for cramping. Reported on 09/24/2015   IBUPROFEN (ADVIL) 200 MG TABLET    Take 200 mg by mouth every 6 (six) hours as needed (inflammation).   IMMUNE GLOBULIN 10% (GAMUNEX-C) 10 GM/100ML SOLN    Inject into the vein. Every 3 weeks    LEVOTHYROXINE (SYNTHROID) 50 MCG TABLET    Take 1 tablet (50 mcg total) by mouth daily before breakfast.   LIDOCAINE-PRILOCAINE (EMLA) CREAM    Apply 1 application  topically as needed (access port).   ONDANSETRON (ZOFRAN) 8 MG TABLET    Take by mouth every 8 (eight) hours as needed for nausea or vomiting.   RIZATRIPTAN (MAXALT-MLT) 10 MG DISINTEGRATING TABLET    Take 1 tablet (10 mg total) by mouth as needed for migraine. May repeat in 2 hours if needed  Modified Medications   No medications on file  Discontinued Medications   No medications on file      Past Medical History:  Diagnosis Date   Anxiety    Arthritis    Asthma    Bipolar disorder ([place])    Bulging lumbar disc 07/19/05   Chronic headaches    Chronic lymphocytic leukemia ([place]) 07/31/2016   Colon polyps    Degenerative disorder of bone    Depression    History of borderline personality disorder    Hypothyroidism 2007   Developed after use of Lithium   IBS (irritable bowel syndrome) 1995   Pneumonia    Proctitis    Shingles 07/2018   Substance abuse ([place])    ativan last month    Social History   Tobacco Use   Smoking status: Former    Types: Cigarettes    Quit date: 07/20/1980    Years since quitting: 41.4   Smokeless tobacco: Never   Tobacco comments:    Also exposed to second hand smoke as a child  Vaping Use   Vaping Use: Never used  Substance Use Topics   Alcohol use: Not Currently    Comment: occ   Drug use: Not Currently    Family History  Problem Relation Age of Onset   Depression Mother    Hypertension Mother    Thyroid disease Father    Alzheimer's disease Father    Post-traumatic stress disorder Sister    Alcohol abuse Brother    Drug abuse Brother    Post-traumatic stress disorder Brother    COPD Maternal Grandmother    Heart disease Maternal Grandfather    Arthritis Paternal Grandmother    Diabetes Paternal Grandmother    Depression Paternal Grandmother    Alzheimer's disease  Paternal Grandmother    Heart disease Paternal Grandfather    Coronary artery disease Paternal Grandfather    Alcohol abuse Paternal Grandfather    Sleep apnea Cousin    Diabetes Maternal Uncle    Colon cancer Neg Hx    Breast cancer Neg Hx     Allergies  Allergen Reactions   Aripiprazole Anaphylaxis and Swelling   Ciprofloxacin Shortness Of Breath  Lamotrigine Rash and Other (See Comments)   Nitrofurantoin Hives   Other Anaphylaxis, Hives and Swelling    Walnuts and pine nuts   Peanut Oil Anaphylaxis, Hives, Swelling and Rash   Amoxicillin Hives and Rash    Update - pt states she has been tested by her allergist and is no longer allergic to penicillin 03/2021   Doxycycline Hives and Rash   Latex Rash and Other (See Comments)   Meloxicam Itching and Other (See Comments)    Induces mania Interacts with Lithium   Naproxen Hives and Other (See Comments)    Interacts with lithium   Pramipexole Hives and Rash   Prednisone Other (See Comments)    Interacts with Lithium   Risperidone Hives and Rash   Sulfa Antibiotics Rash    "that leaves scarring"   Tape Itching and Rash    Steri-StripsT   Cortisone Other (See Comments)    Exacerbates the mania of her bipolar   Keflex [Cephalexin]     Pt developed a rash after taking high dose keflex for several days    Percocet [Oxycodone-Acetaminophen] Other (See Comments)    Severe dizziness   Shingrix [Zoster Vac Recomb Adjuvanted] Swelling    rash   Vancomycin Itching    Itching on head and at IV insertion site where Vanc running   Cuvitru [Immune Globulin (Human)] Rash    Also allergic to Hizentra    Review of Systems  All other systems reviewed and are negative.  Except as noted above.     OBJECTIVE:    Vitals:   01/01/22 1353  BP: 110/75  Pulse: 89  Temp: 97.9 F (36.6 C)  TempSrc: Oral  SpO2: 98%  Weight: 180 lb (81.6 kg)  Height: '5\' 3"'$  (1.6 m)     Body mass index is 31.89 kg/m.  Physical  Exam Constitutional:      General: She is not in acute distress.    Appearance: Normal appearance.  HENT:     Head: Normocephalic and atraumatic.  Eyes:     Extraocular Movements: Extraocular movements intact.     Conjunctiva/sclera: Conjunctivae normal.  Pulmonary:     Effort: Pulmonary effort is normal. No respiratory distress.  Skin:    General: Skin is warm and dry.     Findings: No erythema.     Comments: See pictures below.  Erythema/lymphangitis has essentially resolved.  There are some scattered bulla noted in the pictures that do not appear c/w ongoing infection and do not appear c/w zoster either.   Neurological:     General: No focal deficit present.     Mental Status: She is alert and oriented to person, place, and time.  Psychiatric:        Mood and Affect: Mood normal.        Behavior: Behavior normal.          Labs and Microbiology:     Latest Ref Rng & Units 12/31/2021    4:31 AM 12/30/2021    3:32 AM 12/29/2021    4:04 AM  CBC  WBC 4.0 - 10.5 K/uL 103.5  107.6  93.5   Hemoglobin 12.0 - 15.0 g/dL 10.5  10.9  11.0   Hematocrit 36.0 - 46.0 % 34.3  35.4  36.3   Platelets 150 - 400 K/uL 134  129  130       Latest Ref Rng & Units 12/31/2021    4:31 AM 12/30/2021    3:32 AM 12/29/2021  4:04 AM  CMP  Glucose 70 - 99 mg/dL 93  90  85   BUN 6 - 20 mg/dL '20  21  12   '$ Creatinine 0.44 - 1.00 mg/dL 0.98  1.22  0.96   Sodium 135 - 145 mmol/L 143  141  140   Potassium 3.5 - 5.1 mmol/L 4.9  5.1  5.3   Chloride 98 - 111 mmol/L 113  112  110   CO2 22 - 32 mmol/L '25  26  23   '$ Calcium 8.9 - 10.3 mg/dL 8.9  9.2  9.0      Recent Results (from the past 240 hour(s))  MRSA Next Gen by PCR, Nasal     Status: None   Collection Time: 12/27/21 10:22 PM   Specimen: Nasal Mucosa; Nasal Swab  Result Value Ref Range Status   MRSA by PCR Next Gen NOT DETECTED NOT DETECTED Final    Comment: (NOTE) The GeneXpert MRSA Assay (FDA approved for NASAL specimens only), is one  component of a comprehensive MRSA colonization surveillance program. It is not intended to diagnose MRSA infection nor to guide or monitor treatment for MRSA infections. Test performance is not FDA approved in patients less than 67 years old. Performed at [facility], [place] [address], [place], [place] [address]       ASSESSMENT & PLAN:    Chronic lymphocytic leukemia ([place]) She has active CLL with avid LN above and below diaphragm.  Her WBC is continuing to rise.  She also had a LN biopsy recently at [facility] that was consistent with this.  She will see her hematologist for follow up soon and I recommend doing so as well.  I would have concern that the remaining lesions may be a dermatologic manifestation related to her active CLL as a WBC of over 100k seems inconsistent with cellulitis.   Cellulitis Her cellulitic rash appears resolved at this time and she will complete 5 days of cefdinir as prescribed from the hospital.  If she were to have recurrent cellulitis choosing the ideal antibiotic can challenging to say the least given her allergy history.   I would suggest a short course of linezolid '600mg'$  BID if this were to recur but would not advise any suppressive antibiotics at this time.  The remaining bullae-like lesions do not appear consistent with ongoing infection nor does it appear consistent with zoster given her potential history of recurrent infection (currently off anti-virals, previously seen at [facility] by Dr Jackquline Berlin).  Will forward this note to her PCP for linezolid recommendations and also available as needed for anything.         [facility] for Infectious [facility] 01/01/2022, 2:56 PM  I have personally spent 60 minutes involved in face-to-face and non-face-to-face activities for this patient on the day of the visit. Professional time spent includes the following activities: Preparing to see the patient (review of  tests), Obtaining and/or reviewing separately obtained history (admission/discharge record), Performing a medically appropriate examination and/or evaluation , Ordering medications/tests/procedures, referring and communicating with other health care professionals, Documenting clinical information in the EMR, Independently interpreting results (not separately reported), Communicating results to the patient/family/caregiver, Counseling and educating the patient/family/caregiver and Care coordination (not separately reported).

## 2022-01-01 NOTE — Assessment & Plan Note (Signed)
Her cellulitic rash appears resolved at this time and she will complete 5 days of cefdinir as prescribed from the hospital.  If she were to have recurrent cellulitis choosing the ideal antibiotic can challenging to say the least given her allergy history.   I would suggest a short course of linezolid '600mg'$  BID if this were to recur but would not advise any suppressive antibiotics at this time.  The remaining bullae-like lesions do not appear consistent with ongoing infection nor does it appear consistent with zoster given her potential history of recurrent infection (currently off anti-virals, previously seen at [facility] by Dr Jackquline Berlin).  Will forward this note to her PCP for linezolid recommendations and also available as needed for anything.

## 2022-01-02 ENCOUNTER — Telehealth: Payer: Self-pay

## 2022-01-02 ENCOUNTER — Encounter: Payer: Self-pay | Admitting: General Practice

## 2022-01-02 NOTE — Progress Notes (Signed)
[facility] Spiritual Care Note  Received call from [employer] to process some of the heaviness of mortality as she is coping with the recent death of a close friend, plus her recent hospital stay coupled with CLL progression. 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  We plan to follow up in ca two weeks, as she needs.   [place], [place], [facility] Pager [phone] Voicemail [phone]

## 2022-01-02 NOTE — Telephone Encounter (Signed)
1st attempt- LVM to return call and make an appointment in 1-2 weeks w/ PCP

## 2022-01-06 DIAGNOSIS — R5383 Other fatigue: Secondary | ICD-10-CM | POA: Diagnosis not present

## 2022-01-08 ENCOUNTER — Other Ambulatory Visit: Payer: Medicare Other

## 2022-01-08 ENCOUNTER — Encounter: Payer: Self-pay | Admitting: Family Medicine

## 2022-01-08 ENCOUNTER — Telehealth: Payer: Self-pay

## 2022-01-08 DIAGNOSIS — Z959 Presence of cardiac and vascular implant and graft, unspecified: Secondary | ICD-10-CM | POA: Diagnosis not present

## 2022-01-08 DIAGNOSIS — C9110 Chronic lymphocytic leukemia of B-cell type not having achieved remission: Secondary | ICD-10-CM

## 2022-01-08 NOTE — Telephone Encounter (Signed)
And pt is scheduled.

## 2022-01-08 NOTE — Telephone Encounter (Signed)
Received fax that pt is coming for labs.   Lab orders that are needed are pended. Okay to sign?

## 2022-01-08 NOTE — Telephone Encounter (Signed)
Orders signed.

## 2022-01-09 ENCOUNTER — Telehealth: Payer: Self-pay

## 2022-01-09 ENCOUNTER — Other Ambulatory Visit (INDEPENDENT_AMBULATORY_CARE_PROVIDER_SITE_OTHER): Payer: Medicare Other

## 2022-01-09 DIAGNOSIS — C9110 Chronic lymphocytic leukemia of B-cell type not having achieved remission: Secondary | ICD-10-CM | POA: Diagnosis not present

## 2022-01-09 LAB — CBC WITH DIFFERENTIAL/PLATELET
Basophils Absolute: 0.2 10*3/uL — ABNORMAL HIGH (ref 0.0–0.1)
Basophils Relative: 0.1 % (ref 0.0–3.0)
Eosinophils Absolute: 0.6 10*3/uL (ref 0.0–0.7)
Eosinophils Relative: 0.3 % (ref 0.0–5.0)
HCT: 37.1 % (ref 36.0–46.0)
Hemoglobin: 11.7 g/dL — ABNORMAL LOW (ref 12.0–15.0)
Lymphocytes Relative: 94.5 % — ABNORMAL HIGH (ref 12.0–46.0)
Lymphs Abs: 159.2 10*3/uL — ABNORMAL HIGH (ref 0.7–4.0)
MCHC: 31.7 g/dL (ref 30.0–36.0)
MCV: 88.5 fl (ref 78.0–100.0)
Monocytes Absolute: 3.2 10*3/uL — ABNORMAL HIGH (ref 0.1–1.0)
Monocytes Relative: 1.9 % — ABNORMAL LOW (ref 3.0–12.0)
Neutro Abs: 5.4 10*3/uL (ref 1.4–7.7)
Neutrophils Relative %: 3.2 % — ABNORMAL LOW (ref 43.0–77.0)
Platelets: 166 10*3/uL (ref 150.0–400.0)
RBC: 4.19 Mil/uL (ref 3.87–5.11)
RDW: 16.5 % — ABNORMAL HIGH (ref 11.5–15.5)
WBC: 168.6 10*3/uL (ref 4.0–10.5)

## 2022-01-09 LAB — COMPREHENSIVE METABOLIC PANEL
ALT: 23 U/L (ref 0–35)
AST: 41 U/L — ABNORMAL HIGH (ref 0–37)
Albumin: 4.5 g/dL (ref 3.5–5.2)
Alkaline Phosphatase: 72 U/L (ref 39–117)
BUN: 16 mg/dL (ref 6–23)
CO2: 28 mEq/L (ref 19–32)
Calcium: 10 mg/dL (ref 8.4–10.5)
Chloride: 101 mEq/L (ref 96–112)
Creatinine, Ser: 1.23 mg/dL — ABNORMAL HIGH (ref 0.40–1.20)
GFR: 48.94 mL/min — ABNORMAL LOW (ref >60.00)
Glucose, Bld: 83 mg/dL (ref 70–99)
Potassium: 4.8 mEq/L (ref 3.5–5.1)
Sodium: 137 mEq/L (ref 135–145)
Total Bilirubin: 0.4 mg/dL (ref 0.2–1.2)
Total Protein: 7.4 g/dL (ref 6.0–8.3)

## 2022-01-09 LAB — PHOSPHORUS: Phosphorus: 4.6 mg/dL (ref 2.3–4.6)

## 2022-01-09 LAB — LACTATE DEHYDROGENASE: LDH: 486 U/L — ABNORMAL HIGH (ref 120–250)

## 2022-01-09 LAB — URIC ACID: Uric Acid, Serum: 6.8 mg/dL (ref 2.4–7.0)

## 2022-01-10 ENCOUNTER — Encounter: Payer: Self-pay | Admitting: Family Medicine

## 2022-01-11 NOTE — Progress Notes (Signed)
[facility] [address] [place], [place] [address] [phone]  Date:  01/14/2022   Name:  Morgan [name]   DOB:  06-17-1966   MRN:  [id number]  PCP:  Darreld Mclean, MD    Chief Complaint: Hospitalization Follow-up (Concerns/ questions: 1. pt would like to review her labs to make sure nothing is being missed. 2. TSH was elevated while in the hospital. 3. Extreme fatigue. 4. More abd and back pain. Intermittent pressure with urination. /)   History of Present Illness:  Morgan [name] is a 56 y.o. very pleasant female patient who presents with the following:  Patient seen today for follow-up-last seen by myself in February Her most significant history is CLL managed by [facility], she also has mood disorder, hypothyroidism  She had contacted me last month with concern of cellulitis on her leg, unfortunately this got worse despite antibiotic treatment and she was admitted to the hospital as follows: Admit date:     12/27/2021  Discharge date: 12/31/21  Discharge Physician: Raiford Noble, DO   Recommendations at discharge:  Follow-up with PCP within 1 to 2 weeks and repeat CBC, CMP, mag, Phos within 1 week Follow-up with medical oncology Dr. Dorothea Ogle at [facility] within 1 week   Discharge Diagnoses: Principal Problem:   Cellulitis  Hospital Course: Brief narrative: Morgan [name] is a 56 y.o. female with PMH significant for CLL diagnosis Janurary 2018 currently RAI stage 1 s/p ibrutinib plus rituximab treatment, currently on monitoring, asthma, bipolar disorder, prior alcohol abuse, IBS, hypothyroidism. 1 week prior to presentation, patient got a spider bite on her left thigh causing localized cellulitis she took a course of clindamycin for about a week with resolution of the cellulitis.  Few days ago prior to presentation, while on clindamycin, while walking on a park, patient had muscular bites in her right thigh.   Within 24 hours of the event, she started having redness, pain and streaking up her thigh.  The areas of bites started to form tiny bullae.  She had low-grade fever, chills, fatigue, nausea.  She presented to an urgent care center and was referred to our ED where she presented on 6/10.   She was admitted to hospitalist service and was treated with IV antibiotics of ceftriaxone.  Her cellulitis started improving when her WBC elevated but is now stable and trended back down.  She is medically stable to be discharged on oral antibiotics and will need to follow-up with her PCP and her primary medical oncologist in outpatient setting.  She did have an AKI while she was hospitalized which is improved and resolved now.   Assessment and Plan:   Cellulitis of Right thigh/erysipelas -Probably started after muscular bites of the right thigh few days ago.  He has multiple visible lesions on the right thigh with small blood-filled bullae.  The surrounding area of cellulitis seems to be currently improving on IV Rocephin and will change to p.o. cefdinir at discharge for 5 more days -This is improving  CLL Leukocytosis -RAI stage I  -most recent medication stopped due to intolerance per heme note  -patient noted only fatigue  -has chronic leukocytosis but now significantly above baseline. -Previous hospitalist checked with oncology Dr Alen Blew recommend treat infection and outpatient follow up with [facility]  -Leukocytosis steadily worsening.  WBC count 108K today. -Dr. Pietro Cassis called and left a message for patient's oncologist Dr. Lanette Hampshire at [facility] [phone]) and  he received a call back today and they had no further recommendations. -WBC is improved to 103.5 -We will transition to oral antibiotics for 5 more days of oral cefdinir which she has tolerated in the past -Follow-up in outpatient setting with PCP as well as medical oncology Elevated creatinine -Creatinine slightly elevated than baseline.   Start on IV hydration yesterday. Recent Labs (within last 365 days)    Also, last week the patient had some lab work done per request of her [place] oncology team.  Her white blood cell count was noted to be 168,000-up from 103,000 upon discharge from hospital We got in touch with her oncology team-White blood cell count elevation thought to be nonacute, related to recent cellulitis.  She is following up with oncology on 7/3-pt reports [facility] would like [place] to repeat lab work today in preparation for this appointment  She finished off all her abx now She wonders if she could have a UTI- she has noted sx of suprapubic pain and dysuria She has noted this off and on for a week or so No hematuria No recent fever  She notes belly pain off and on as well- she is s/p chole She may have some RUQ pain that will come and go   [place] notes her GFR has been a bit lower than normal, she wisely wants to keep an eye on this  Lab Results  Component Value Date   TSH 5.086 (H) 12/27/2021    Patient Active Problem List   Diagnosis Date Noted   Cellulitis 12/27/2021   Throat discomfort 03/12/2020   Hypothyroidism 11/18/2018   Fever 06/18/2018   Piriformis syndrome of right side 04/22/2017   Lateral epicondylitis of left elbow 04/06/2017   Solitary pulmonary nodule 10/29/2016   Chronic lymphocytic leukemia ([place]) 07/31/2016   Mass in neck 04/17/2016   Abdominal mass 04/17/2016   Asthma 12/12/2015   Multiple environmental allergies 12/12/2015   Chronic lower back pain 09/12/2015   Bipolar 1 disorder, depressed ([place]) 08/27/2015    Class: Chronic   Borderline personality disorder ([place]) 08/15/2015   Severe benzodiazepine use disorder ([place]) 08/15/2015   Alcohol use disorder, moderate, dependence ([place]) 08/15/2015   PTSD (post-traumatic stress disorder) 08/15/2015   History of bipolar disorder 08/15/2015   Pre-syncope 05/17/2015   Skull deformity 05/17/2015   Patellofemoral syndrome of both knees  05/02/2015    Past Medical History:  Diagnosis Date   Anxiety    Arthritis    Asthma    Bipolar disorder ([place])    Bulging lumbar disc 07/19/05   Chronic headaches    Chronic lymphocytic leukemia ([place]) 07/31/2016   Colon polyps    Degenerative disorder of bone    Depression    History of borderline personality disorder    Hypothyroidism 2007   Developed after use of Lithium   IBS (irritable bowel syndrome) 1995   Pneumonia    Proctitis    Shingles 07/2018   Substance abuse ([place])    ativan last month    Past Surgical History:  Procedure Laterality Date   ABDOMINAL HYSTERECTOMY     APPENDECTOMY     BUNIONECTOMY Right 06/2012   CHOLECYSTECTOMY     OOPHORECTOMY     SHOULDER OPEN ROTATOR CUFF REPAIR Right 03/2010   TUBAL LIGATION      Social History   Tobacco Use   Smoking status: Former    Types: Cigarettes    Quit date: 07/20/1980    Years since quitting: 61.5  Smokeless tobacco: Never   Tobacco comments:    Also exposed to second hand smoke as a child  Vaping Use   Vaping Use: Never used  Substance Use Topics   Alcohol use: Not Currently    Comment: occ   Drug use: Not Currently    Family History  Problem Relation Age of Onset   Depression Mother    Hypertension Mother    Thyroid disease Father    Alzheimer's disease Father    Post-traumatic stress disorder Sister    Alcohol abuse Brother    Drug abuse Brother    Post-traumatic stress disorder Brother    COPD Maternal Grandmother    Heart disease Maternal Grandfather    Arthritis Paternal Grandmother    Diabetes Paternal Grandmother    Depression Paternal Grandmother    Alzheimer's disease Paternal Grandmother    Heart disease Paternal Grandfather    Coronary artery disease Paternal Grandfather    Alcohol abuse Paternal Grandfather    Sleep apnea Cousin    Diabetes Maternal Uncle    Colon cancer Neg Hx    Breast cancer Neg Hx     Allergies  Allergen Reactions   Aripiprazole Anaphylaxis and  Swelling   Ciprofloxacin Shortness Of Breath   Lamotrigine Rash and Other (See Comments)   Nitrofurantoin Hives   Other Anaphylaxis, Hives and Swelling    Walnuts and pine nuts   Peanut Oil Anaphylaxis, Hives, Swelling and Rash   Amoxicillin Hives and Rash    Update - pt states she has been tested by her allergist and is no longer allergic to penicillin 03/2021   Doxycycline Hives and Rash   Latex Rash and Other (See Comments)   Meloxicam Itching and Other (See Comments)    Induces mania Interacts with Lithium   Naproxen Hives and Other (See Comments)    Interacts with lithium   Pramipexole Hives and Rash   Prednisone Other (See Comments)    Interacts with Lithium   Risperidone Hives and Rash   Sulfa Antibiotics Rash    "that leaves scarring"   Tape Itching and Rash    Steri-StripsT   Cortisone Other (See Comments)    Exacerbates the mania of her bipolar   Keflex [Cephalexin]     Pt developed a rash after taking high dose keflex for several days and Facial swelling   Percocet [Oxycodone-Acetaminophen] Other (See Comments)    Severe dizziness   Shingrix [Zoster Vac Recomb Adjuvanted] Swelling    rash   Vancomycin Itching    Itching on head and at IV insertion site where Vanc running   Cuvitru [Immune Globulin (Human)] Rash    Also allergic to Hizentra    Medication list has been reviewed and updated.  Current Outpatient Medications on File Prior to Visit  Medication Sig Dispense Refill   acetaminophen (TYLENOL) 500 MG tablet Take 1,000 mg by mouth every 6 (six) hours as needed for mild pain.     albuterol (VENTOLIN HFA) 108 (90 Base) MCG/ACT inhaler Inhale 1-2 puffs into the lungs every 6 (six) hours as needed for wheezing or shortness of breath. 18 g 6   aspirin-acetaminophen-caffeine (EXCEDRIN EXTRA STRENGTH) 250-250-65 MG tablet Take 2 tablets by mouth every 6 (six) hours as needed for headache.     diphenhydrAMINE (BENADRYL) 25 MG tablet Take 1 tablet (25 mg total) by  mouth every 6 (six) hours as needed for itching. (Patient taking differently: Take 25 mg by mouth every 6 (six) hours as needed  for itching. Uses as premed prior to IVIG) 20 tablet 0   EPINEPHrine 0.3 mg/0.3 mL IJ SOAJ injection Inject 0.3 mLs (0.3 mg total) into the muscle as needed for anaphylaxis. 1 each prn   fexofenadine (ALLEGRA) 180 MG tablet Take 180 mg by mouth daily.     fluticasone furoate-vilanterol (BREO ELLIPTA) 100-25 MCG/ACT AEPB Inhale 1 puff into the lungs daily. 1 each 11   hyoscyamine (LEVSIN) 0.125 MG tablet Take 1 tablet (0.125 mg total) by mouth as needed for cramping. Reported on 09/24/2015 10 tablet 2   ibuprofen (ADVIL) 200 MG tablet Take 200 mg by mouth every 6 (six) hours as needed (inflammation).     Immune Globulin 10% (GAMUNEX-C) 10 GM/100ML SOLN Inject into the vein. Every 3 weeks     levothyroxine (SYNTHROID) 50 MCG tablet Take 1 tablet (50 mcg total) by mouth daily before breakfast. 90 tablet 3   lidocaine-prilocaine (EMLA) cream Apply 1 application  topically as needed (access port).     ondansetron (ZOFRAN) 8 MG tablet Take by mouth every 8 (eight) hours as needed for nausea or vomiting.     rizatriptan (MAXALT-MLT) 10 MG disintegrating tablet Take 1 tablet (10 mg total) by mouth as needed for migraine. May repeat in 2 hours if needed 9 tablet 11   No current facility-administered medications on file prior to visit.    Review of Systems:  As per HPI- otherwise negative.   Physical Examination: Vitals:   01/14/22 1403  BP: 110/72  Pulse: 78  Resp: 18  Temp: 98 F (36.7 C)  SpO2: 98%   Vitals:   01/14/22 1403  Weight: 178 lb 6.4 oz (80.9 kg)  Height: '5\' 3"'$  (1.6 m)   Body mass index is 31.6 kg/m. Ideal Body Weight: Weight in (lb) to have BMI = 25: 140.8  GEN: no acute distress.  Mild obesity, looks well HEENT: Atraumatic, Normocephalic.  Ears and Nose: No external deformity. CV: RRR, No M/G/R. No JVD. No thrill. No extra heart sounds. PULM:  CTA B, no wheezes, crackles, rhonchi. No retractions. No resp. distress. No accessory muscle use. ABD: S, NT, ND, +BS. No rebound. No HSM. EXTR: No c/c/e PSYCH: Normally interactive. Conversant.  Cellulitis of legs is resolved Abd exam is benign right now   Assessment and Plan: Generalized abdominal pain - Plan: Lactate dehydrogenase, Comprehensive metabolic panel, Amylase, Lipase  Chronic lymphocytic leukemia (HCC) - Plan: Lactate dehydrogenase, CBC with Differential/Platelet, CANCELED: CBC with Differential/Platelet  Acquired hypothyroidism - Plan: TSH  Dysuria - Plan: Urine Culture, POCT urinalysis dipstick  Abnormal blood level of magnesium - Plan: Magnesium  Serum phosphate elevated - Plan: Phosphorus  Patient seen today for follow-up, as above she was recently admitted to the hospital with fever resistant cellulitis of both legs.  In addition she has CLL with recent elevation of white blood cell count to approximate 165,000 She also has concern of possible UTI Inpatient team request is to follow-up with magnesium and phosphorus Her TSH was slightly elevated in the hospital as well, repeat today  Signed Lamar Blinks, MD  Results for orders placed or performed in visit on 01/14/22  TSH  Result Value Ref Range   TSH 4.03 0.35 - 5.50 uIU/mL  Comprehensive metabolic panel  Result Value Ref Range   Sodium 135 135 - 145 mEq/L   Potassium 4.1 3.5 - 5.1 mEq/L   Chloride 101 96 - 112 mEq/L   CO2 26 19 - 32 mEq/L   Glucose, Bld 85  70 - 99 mg/dL   BUN 15 6 - 23 mg/dL   Creatinine, Ser 1.05 0.40 - 1.20 mg/dL   Total Bilirubin 0.5 0.2 - 1.2 mg/dL   Alkaline Phosphatase 74 39 - 117 U/L   AST 45 (H) 0 - 37 U/L   ALT 27 0 - 35 U/L   Total Protein 7.7 6.0 - 8.3 g/dL   Albumin 4.7 3.5 - 5.2 g/dL   GFR 59.17 (L) >60.00 mL/min   Calcium 9.7 8.4 - 10.5 mg/dL  Amylase  Result Value Ref Range   Amylase 60 27 - 131 U/L  Lipase  Result Value Ref Range   Lipase 119.0 (H) 11.0 -  59.0 U/L  Magnesium  Result Value Ref Range   Magnesium 2.7 (H) 1.5 - 2.5 mg/dL  Phosphorus  Result Value Ref Range   Phosphorus 3.6 2.3 - 4.6 mg/dL  CBC with Differential/Platelet  Result Value Ref Range   WBC 169.3 Repeated and verified X2. (HH) 4.0 - 10.5 K/uL   RBC 4.21 3.87 - 5.11 Mil/uL   Hemoglobin 11.7 (L) 12.0 - 15.0 g/dL   HCT 37.4 36.0 - 46.0 %   MCV 89.0 78.0 - 100.0 fl   MCHC 31.2 30.0 - 36.0 g/dL   RDW 16.8 (H) 11.5 - 15.5 %   Platelets 146.0 (L) 150.0 - 400.0 K/uL   Neutrophils Relative % 2.9 (L) 43.0 - 77.0 %   Lymphocytes Relative 95.8 Repeated and verified X2. (H) 12.0 - 46.0 %   Monocytes Relative 1.0 (L) 3.0 - 12.0 %   Eosinophils Relative 0.3 0.0 - 5.0 %   Basophils Relative 0.0 0.0 - 3.0 %   Neutro Abs 4.9 1.4 - 7.7 K/uL   Lymphs Abs 162.0 (H) 0.7 - 4.0 K/uL   Monocytes Absolute 1.7 (H) 0.1 - 1.0 K/uL   Eosinophils Absolute 0.6 0.0 - 0.7 K/uL   Basophils Absolute 0.0 0.0 - 0.1 K/uL  POCT urinalysis dipstick  Result Value Ref Range   Color, UA yellow yellow   Clarity, UA cloudy (A) clear   Glucose, UA negative negative mg/dL   Bilirubin, UA negative negative   Ketones, POC UA negative negative mg/dL   Spec Grav, UA 1.010 1.010 - 1.025   Blood, UA negative negative   pH, UA 6.0 5.0 - 8.0   Protein Ur, POC negative negative mg/dL   Urobilinogen, UA 0.2 0.2 or 1.0 E.U./dL   Nitrite, UA Negative Negative   Leukocytes, UA Negative Negative

## 2022-01-12 ENCOUNTER — Encounter: Payer: Self-pay | Admitting: Family Medicine

## 2022-01-13 DIAGNOSIS — D801 Nonfamilial hypogammaglobulinemia: Secondary | ICD-10-CM | POA: Diagnosis not present

## 2022-01-14 ENCOUNTER — Ambulatory Visit (INDEPENDENT_AMBULATORY_CARE_PROVIDER_SITE_OTHER): Payer: Medicare Other | Admitting: Family Medicine

## 2022-01-14 ENCOUNTER — Encounter: Payer: Self-pay | Admitting: Family Medicine

## 2022-01-14 ENCOUNTER — Telehealth: Payer: Self-pay

## 2022-01-14 VITALS — BP 110/72 | HR 78 | Temp 98.0°F | Resp 18 | Ht 63.0 in | Wt 178.4 lb

## 2022-01-14 DIAGNOSIS — C9110 Chronic lymphocytic leukemia of B-cell type not having achieved remission: Secondary | ICD-10-CM

## 2022-01-14 DIAGNOSIS — R790 Abnormal level of blood mineral: Secondary | ICD-10-CM

## 2022-01-14 DIAGNOSIS — R300 Dysuria: Secondary | ICD-10-CM | POA: Diagnosis not present

## 2022-01-14 DIAGNOSIS — R1084 Generalized abdominal pain: Secondary | ICD-10-CM | POA: Diagnosis not present

## 2022-01-14 DIAGNOSIS — E039 Hypothyroidism, unspecified: Secondary | ICD-10-CM

## 2022-01-14 LAB — COMPREHENSIVE METABOLIC PANEL
ALT: 27 U/L (ref 0–35)
AST: 45 U/L — ABNORMAL HIGH (ref 0–37)
Albumin: 4.7 g/dL (ref 3.5–5.2)
Alkaline Phosphatase: 74 U/L (ref 39–117)
BUN: 15 mg/dL (ref 6–23)
CO2: 26 mEq/L (ref 19–32)
Calcium: 9.7 mg/dL (ref 8.4–10.5)
Chloride: 101 mEq/L (ref 96–112)
Creatinine, Ser: 1.05 mg/dL (ref 0.40–1.20)
GFR: 59.17 mL/min — ABNORMAL LOW (ref >60.00)
Glucose, Bld: 85 mg/dL (ref 70–99)
Potassium: 4.1 mEq/L (ref 3.5–5.1)
Sodium: 135 mEq/L (ref 135–145)
Total Bilirubin: 0.5 mg/dL (ref 0.2–1.2)
Total Protein: 7.7 g/dL (ref 6.0–8.3)

## 2022-01-14 LAB — TSH: TSH: 4.03 u[IU]/mL (ref 0.35–5.50)

## 2022-01-14 LAB — CBC WITH DIFFERENTIAL/PLATELET
Basophils Absolute: 0 10*3/uL (ref 0.0–0.1)
Basophils Relative: 0 % (ref 0.0–3.0)
Eosinophils Absolute: 0.6 10*3/uL (ref 0.0–0.7)
Eosinophils Relative: 0.3 % (ref 0.0–5.0)
HCT: 37.4 % (ref 36.0–46.0)
Hemoglobin: 11.7 g/dL — ABNORMAL LOW (ref 12.0–15.0)
Lymphocytes Relative: 95.8 % — ABNORMAL HIGH (ref 12.0–46.0)
Lymphs Abs: 162 10*3/uL — ABNORMAL HIGH (ref 0.7–4.0)
MCHC: 31.2 g/dL (ref 30.0–36.0)
MCV: 89 fl (ref 78.0–100.0)
Monocytes Absolute: 1.7 10*3/uL — ABNORMAL HIGH (ref 0.1–1.0)
Monocytes Relative: 1 % — ABNORMAL LOW (ref 3.0–12.0)
Neutro Abs: 4.9 10*3/uL (ref 1.4–7.7)
Neutrophils Relative %: 2.9 % — ABNORMAL LOW (ref 43.0–77.0)
Platelets: 146 10*3/uL — ABNORMAL LOW (ref 150.0–400.0)
RBC: 4.21 Mil/uL (ref 3.87–5.11)
RDW: 16.8 % — ABNORMAL HIGH (ref 11.5–15.5)
WBC: 169.3 10*3/uL (ref 4.0–10.5)

## 2022-01-14 LAB — POCT URINALYSIS DIP (MANUAL ENTRY)
Bilirubin, UA: NEGATIVE
Blood, UA: NEGATIVE
Glucose, UA: NEGATIVE mg/dL
Ketones, POC UA: NEGATIVE mg/dL
Leukocytes, UA: NEGATIVE
Nitrite, UA: NEGATIVE
Protein Ur, POC: NEGATIVE mg/dL
Spec Grav, UA: 1.01 (ref 1.010–1.025)
Urobilinogen, UA: 0.2 E.U./dL
pH, UA: 6 (ref 5.0–8.0)

## 2022-01-14 LAB — MAGNESIUM: Magnesium: 2.7 mg/dL — ABNORMAL HIGH (ref 1.5–2.5)

## 2022-01-14 LAB — PHOSPHORUS: Phosphorus: 3.6 mg/dL (ref 2.3–4.6)

## 2022-01-14 LAB — LIPASE: Lipase: 119 U/L — ABNORMAL HIGH (ref 11.0–59.0)

## 2022-01-14 LAB — AMYLASE: Amylase: 60 U/L (ref 27–131)

## 2022-01-14 NOTE — Patient Instructions (Addendum)
It was good to see you today- take care and I will be in touch with your labs asap!    Please let me know if anything is changing or getting worse

## 2022-01-14 NOTE — Telephone Encounter (Signed)
CRITICAL VALUE STICKER  CRITICAL VALUE: WBC: 169.3  RECEIVER (on-site recipient of call): [place]  DATE & TIME NOTIFIED:  4:38 PM, 01/14/22  MESSENGER (representative from lab): Santiago Glad  MD NOTIFIED: yes  TIME OF NOTIFICATION: 4:38 PM, 01/14/22  RESPONSE: pending

## 2022-01-15 ENCOUNTER — Encounter: Payer: Self-pay | Admitting: General Practice

## 2022-01-15 ENCOUNTER — Encounter: Payer: Self-pay | Admitting: Family Medicine

## 2022-01-15 DIAGNOSIS — Z959 Presence of cardiac and vascular implant and graft, unspecified: Secondary | ICD-10-CM | POA: Diagnosis not present

## 2022-01-15 LAB — URINE CULTURE
MICRO NUMBER:: 13583538
Result:: NO GROWTH
SPECIMEN QUALITY:: ADEQUATE

## 2022-01-15 NOTE — Progress Notes (Signed)
[place] Spiritual Care Note  Received call from [facility] to process distress related to health concerns. She is working hard with Drs Copland (PCP) and Dorothea Ogle ([place] oncology) to identify the cause(s) of her physical symptoms, as well as with her counseling and cancer support team regarding the emotional distress--particularly related to compromised quality of life and questions of mortality. She is actively planning trips and other goals for living well in the midst of chronic illness.  Plan to follow up by phone tomorrow per her request. We also plan to set up a time to talk through personal goals of living and preparing for end of life, so that she can have peace of mind knowing that she has addressed arrangements, including her celebration of life.   [place], [place], [facility] Pager [phone] Voicemail [phone]

## 2022-01-16 ENCOUNTER — Encounter: Payer: Self-pay | Admitting: General Practice

## 2022-01-16 NOTE — Progress Notes (Signed)
[facility] Spiritual Care Note  Followed up briefly by phone as planned. Morgan [name] reports feeling better overall, both emotionally and physically, than she did yesterday. She is continuing to work on her Celebration of Life ceremony plans as part of her emotional processing. We plan to follow up by phone next week as needed.   [place], [place], [facility] Pager [phone] Voicemail [phone]

## 2022-01-17 ENCOUNTER — Telehealth (HOSPITAL_BASED_OUTPATIENT_CLINIC_OR_DEPARTMENT_OTHER): Payer: Self-pay

## 2022-01-17 ENCOUNTER — Encounter (HOSPITAL_BASED_OUTPATIENT_CLINIC_OR_DEPARTMENT_OTHER): Payer: Self-pay

## 2022-01-17 ENCOUNTER — Other Ambulatory Visit (HOSPITAL_BASED_OUTPATIENT_CLINIC_OR_DEPARTMENT_OTHER): Payer: Medicare Other

## 2022-01-17 ENCOUNTER — Ambulatory Visit (HOSPITAL_BASED_OUTPATIENT_CLINIC_OR_DEPARTMENT_OTHER)
Admission: RE | Admit: 2022-01-17 | Discharge: 2022-01-17 | Disposition: A | Discharge status: Home / Self Care | Payer: Medicare Other | Source: Ambulatory Visit | Attending: Family Medicine | Admitting: Family Medicine

## 2022-01-17 DIAGNOSIS — C9110 Chronic lymphocytic leukemia of B-cell type not having achieved remission: Secondary | ICD-10-CM

## 2022-01-17 DIAGNOSIS — R161 Splenomegaly, not elsewhere classified: Secondary | ICD-10-CM | POA: Diagnosis not present

## 2022-01-17 DIAGNOSIS — R590 Localized enlarged lymph nodes: Secondary | ICD-10-CM | POA: Diagnosis not present

## 2022-01-17 DIAGNOSIS — R1084 Generalized abdominal pain: Secondary | ICD-10-CM

## 2022-01-17 DIAGNOSIS — K7689 Other specified diseases of liver: Secondary | ICD-10-CM | POA: Diagnosis not present

## 2022-01-17 DIAGNOSIS — J351 Hypertrophy of tonsils: Secondary | ICD-10-CM | POA: Diagnosis not present

## 2022-01-17 DIAGNOSIS — R599 Enlarged lymph nodes, unspecified: Secondary | ICD-10-CM | POA: Diagnosis not present

## 2022-01-17 DIAGNOSIS — C8590 Non-Hodgkin lymphoma, unspecified, unspecified site: Secondary | ICD-10-CM | POA: Diagnosis not present

## 2022-01-17 MED ORDER — IOHEXOL 300 MG/ML  SOLN
100.0000 mL | Freq: Once | INTRAMUSCULAR | Status: AC | PRN
  Administered 2022-01-17: 100 mL via INTRAVENOUS

## 2022-01-17 NOTE — Telephone Encounter (Signed)
Patient presented to CT with poor vasculature with multiple unsuccessful IV attempts. Patient has a Power Port - this Therapist, sports at the request of the Camera operator and CT staff accessed port.  Port sucessfully accessed with a 20G needle using sterile technique. Good blood flow noted and no pain reported by patient.   After scan was complete - port was discontinued by this RN using Heparin lock flush 500units per protocol. Cleared by ED doc Ashok Cordia

## 2022-01-18 ENCOUNTER — Encounter: Payer: Self-pay | Admitting: Family Medicine

## 2022-01-19 ENCOUNTER — Encounter: Payer: Self-pay | Admitting: General Practice

## 2022-01-19 ENCOUNTER — Encounter: Payer: Self-pay | Admitting: Family Medicine

## 2022-01-19 DIAGNOSIS — R55 Syncope and collapse: Secondary | ICD-10-CM | POA: Diagnosis not present

## 2022-01-19 DIAGNOSIS — D72829 Elevated white blood cell count, unspecified: Secondary | ICD-10-CM | POA: Diagnosis not present

## 2022-01-19 DIAGNOSIS — J45909 Unspecified asthma, uncomplicated: Secondary | ICD-10-CM | POA: Diagnosis not present

## 2022-01-19 DIAGNOSIS — R1030 Lower abdominal pain, unspecified: Secondary | ICD-10-CM | POA: Diagnosis not present

## 2022-01-19 DIAGNOSIS — I493 Ventricular premature depolarization: Secondary | ICD-10-CM | POA: Diagnosis not present

## 2022-01-19 DIAGNOSIS — Z87891 Personal history of nicotine dependence: Secondary | ICD-10-CM | POA: Diagnosis not present

## 2022-01-19 DIAGNOSIS — R634 Abnormal weight loss: Secondary | ICD-10-CM | POA: Diagnosis not present

## 2022-01-19 DIAGNOSIS — C9110 Chronic lymphocytic leukemia of B-cell type not having achieved remission: Secondary | ICD-10-CM

## 2022-01-19 DIAGNOSIS — D649 Anemia, unspecified: Secondary | ICD-10-CM | POA: Diagnosis not present

## 2022-01-19 DIAGNOSIS — R1012 Left upper quadrant pain: Secondary | ICD-10-CM | POA: Diagnosis not present

## 2022-01-19 DIAGNOSIS — D801 Nonfamilial hypogammaglobulinemia: Secondary | ICD-10-CM | POA: Diagnosis not present

## 2022-01-19 DIAGNOSIS — R195 Other fecal abnormalities: Secondary | ICD-10-CM | POA: Diagnosis not present

## 2022-01-19 DIAGNOSIS — K580 Irritable bowel syndrome with diarrhea: Secondary | ICD-10-CM | POA: Diagnosis not present

## 2022-01-19 DIAGNOSIS — R161 Splenomegaly, not elsewhere classified: Secondary | ICD-10-CM | POA: Diagnosis not present

## 2022-01-19 DIAGNOSIS — Z79624 Long term (current) use of inhibitors of nucleotide synthesis: Secondary | ICD-10-CM | POA: Diagnosis not present

## 2022-01-19 DIAGNOSIS — E039 Hypothyroidism, unspecified: Secondary | ICD-10-CM | POA: Diagnosis not present

## 2022-01-19 DIAGNOSIS — R590 Localized enlarged lymph nodes: Secondary | ICD-10-CM | POA: Diagnosis not present

## 2022-01-19 DIAGNOSIS — R42 Dizziness and giddiness: Secondary | ICD-10-CM | POA: Diagnosis not present

## 2022-01-19 DIAGNOSIS — R002 Palpitations: Secondary | ICD-10-CM | POA: Diagnosis not present

## 2022-01-19 DIAGNOSIS — R911 Solitary pulmonary nodule: Secondary | ICD-10-CM | POA: Diagnosis not present

## 2022-01-19 DIAGNOSIS — R6881 Early satiety: Secondary | ICD-10-CM | POA: Diagnosis not present

## 2022-01-19 DIAGNOSIS — R591 Generalized enlarged lymph nodes: Secondary | ICD-10-CM | POA: Diagnosis not present

## 2022-01-19 DIAGNOSIS — R5383 Other fatigue: Secondary | ICD-10-CM | POA: Diagnosis not present

## 2022-01-19 DIAGNOSIS — D696 Thrombocytopenia, unspecified: Secondary | ICD-10-CM | POA: Diagnosis not present

## 2022-01-19 DIAGNOSIS — Z79899 Other long term (current) drug therapy: Secondary | ICD-10-CM | POA: Diagnosis not present

## 2022-01-19 DIAGNOSIS — Z7951 Long term (current) use of inhaled steroids: Secondary | ICD-10-CM | POA: Diagnosis not present

## 2022-01-19 NOTE — Progress Notes (Signed)
[place] Spiritual Care Note  Received call from [facility] to begin to process her recent CT results, which indicate CLL progression. She is appreciative of palliative referral as an additional layer of support for symptom management and is working to continue to discern how God is calling her to respond to news of progression. So far, she senses that she is not called to do treatment at this time--and she struggles with knowing that other people (from family to providers) may struggle themselves with understanding this decision. She plans to take one day at a time and has a self-care plan in place for talking with "[her] professionals" (counselor, chaplain, etc) about her discernment. [place] plans to phone chaplain again as needed.   [place], [place], [facility] Pager [phone] Voicemail [phone]

## 2022-01-21 DIAGNOSIS — D801 Nonfamilial hypogammaglobulinemia: Secondary | ICD-10-CM | POA: Diagnosis not present

## 2022-01-23 ENCOUNTER — Other Ambulatory Visit: Payer: Self-pay | Admitting: Family Medicine

## 2022-01-23 DIAGNOSIS — Z1231 Encounter for screening mammogram for malignant neoplasm of breast: Secondary | ICD-10-CM

## 2022-01-26 ENCOUNTER — Encounter: Payer: Self-pay | Admitting: Family Medicine

## 2022-01-26 ENCOUNTER — Inpatient Hospital Stay: Payer: Medicare Other | Attending: Internal Medicine

## 2022-01-26 ENCOUNTER — Telehealth: Payer: Self-pay

## 2022-01-26 ENCOUNTER — Other Ambulatory Visit (INDEPENDENT_AMBULATORY_CARE_PROVIDER_SITE_OTHER): Payer: Medicare Other

## 2022-01-26 ENCOUNTER — Other Ambulatory Visit: Payer: Self-pay

## 2022-01-26 DIAGNOSIS — C9110 Chronic lymphocytic leukemia of B-cell type not having achieved remission: Secondary | ICD-10-CM

## 2022-01-26 LAB — CBC WITH DIFFERENTIAL/PLATELET
Basophils Absolute: 0 10*3/uL (ref 0.0–0.1)
Basophils Relative: 0 % (ref 0.0–3.0)
Eosinophils Absolute: 0.3 10*3/uL (ref 0.0–0.7)
Eosinophils Relative: 0.2 % (ref 0.0–5.0)
HCT: 35.8 % — ABNORMAL LOW (ref 36.0–46.0)
Hemoglobin: 11.4 g/dL — ABNORMAL LOW (ref 12.0–15.0)
Lymphocytes Relative: 96.1 % — ABNORMAL HIGH (ref 12.0–46.0)
Lymphs Abs: 136.4 10*3/uL — ABNORMAL HIGH (ref 0.7–4.0)
MCHC: 31.9 g/dL (ref 30.0–36.0)
MCV: 88.1 fl (ref 78.0–100.0)
Monocytes Absolute: 1.8 10*3/uL — ABNORMAL HIGH (ref 0.1–1.0)
Monocytes Relative: 1.3 % — ABNORMAL LOW (ref 3.0–12.0)
Neutro Abs: 3.4 10*3/uL (ref 1.4–7.7)
Neutrophils Relative %: 2.4 % — ABNORMAL LOW (ref 43.0–77.0)
Platelets: 118 10*3/uL — ABNORMAL LOW (ref 150.0–400.0)
RBC: 4.07 Mil/uL (ref 3.87–5.11)
RDW: 17.1 % — ABNORMAL HIGH (ref 11.5–15.5)
WBC: 141.9 10*3/uL (ref 4.0–10.5)

## 2022-01-26 LAB — COMPREHENSIVE METABOLIC PANEL
ALT: 17 U/L (ref 0–35)
AST: 40 U/L — ABNORMAL HIGH (ref 0–37)
Albumin: 4.4 g/dL (ref 3.5–5.2)
Alkaline Phosphatase: 73 U/L (ref 39–117)
BUN: 10 mg/dL (ref 6–23)
CO2: 26 mEq/L (ref 19–32)
Calcium: 9.4 mg/dL (ref 8.4–10.5)
Chloride: 105 mEq/L (ref 96–112)
Creatinine, Ser: 1.07 mg/dL (ref 0.40–1.20)
GFR: 57.83 mL/min — ABNORMAL LOW (ref >60.00)
Glucose, Bld: 81 mg/dL (ref 70–99)
Potassium: 4.1 mEq/L (ref 3.5–5.1)
Sodium: 139 mEq/L (ref 135–145)
Total Bilirubin: 0.4 mg/dL (ref 0.2–1.2)
Total Protein: 7.5 g/dL (ref 6.0–8.3)

## 2022-01-26 LAB — URIC ACID: Uric Acid, Serum: 6.7 mg/dL (ref 2.4–7.0)

## 2022-01-26 LAB — PHOSPHORUS: Phosphorus: 3.7 mg/dL (ref 2.3–4.6)

## 2022-01-26 NOTE — Addendum Note (Signed)
Addended by: Kelle Darting A on: 01/26/2022 10:48 AM   Modules accepted: Orders

## 2022-01-26 NOTE — Telephone Encounter (Signed)
CRITICAL VALUE STICKER  CRITICAL VALUE: WBC 141.9  RECEIVER (on-site recipient of call): Mehreen Azizi, RMA  DATE & TIME NOTIFIED: 01/26/2022 @ 1:00 pm  MESSENGER (representative from lab): Marshall Cork  MD NOTIFIED: Lamar Blinks, MD   TIME OF NOTIFICATION: 1:04 pm  RESPONSE:

## 2022-01-26 NOTE — Progress Notes (Signed)
[place] Social Work  Patient presented to [facility]  to review and complete healthcare advance directives.  Clinical Social Worker met with patient.  The patient designated [employer] as their primary healthcare agent and [employer] as their secondary agent.  Patient also completed healthcare living will.    Documents were notarized and copies made for patient/family. Clinical Social Worker will send documents to medical records to be scanned into patient's chart. Clinical Social Worker encouraged patient/family to contact with any additional questions or concerns.   Margaree Mackintosh, LCSW Clinical Social Worker [place]        Patient is participating in a Managed Medicaid Plan:  Yes

## 2022-01-27 ENCOUNTER — Encounter: Payer: Self-pay | Admitting: General Practice

## 2022-01-27 LAB — LACTATE DEHYDROGENASE: LDH: 486 U/L — ABNORMAL HIGH (ref 120–250)

## 2022-01-27 NOTE — Telephone Encounter (Signed)
Please advise 

## 2022-01-27 NOTE — Progress Notes (Signed)
[place] Spiritual Care Note  Had special extended appointment with Jaclyn Shaggy today to support her in going through her "Go Wish Game," which is a deck of cards used for identifying and sorting priorities and goals toward the end of life. Anitria is using these as a tool for preparing for conversations with Palliative Care and her two children, [name] Visual merchandiser) and [place]).  Laurina is actively working on healing relationships, defining goals for living, and anticipating what she would value in a dying process. She wishes to be kept as comfortable as possible with physical care of her body and meaningful touch, but does NOT want extensive pain or anxiety medication that would compromise her alertness and cognition. She would, however, want support in being kept alive for her son [name] to be able to arrive from [place] to be with her.  Provided empathic listening, pastoral reflection, discernment questions, resource education, and prayer. [place] plans to follow up when needed.   [place], [place], [facility] Pager [phone] Voicemail [phone]

## 2022-01-30 ENCOUNTER — Telehealth: Payer: Self-pay | Admitting: *Deleted

## 2022-01-30 NOTE — Telephone Encounter (Signed)
Pt has lab appointment Monday but no future lab orders are in Epic.  Please place future orders if appropriate.

## 2022-01-30 NOTE — Telephone Encounter (Signed)
Same labs?

## 2022-02-02 ENCOUNTER — Other Ambulatory Visit: Payer: Self-pay

## 2022-02-02 ENCOUNTER — Other Ambulatory Visit: Payer: Medicare Other

## 2022-02-02 ENCOUNTER — Telehealth: Payer: Self-pay

## 2022-02-02 ENCOUNTER — Other Ambulatory Visit (INDEPENDENT_AMBULATORY_CARE_PROVIDER_SITE_OTHER): Payer: Medicare Other

## 2022-02-02 DIAGNOSIS — C9110 Chronic lymphocytic leukemia of B-cell type not having achieved remission: Secondary | ICD-10-CM

## 2022-02-02 LAB — COMPREHENSIVE METABOLIC PANEL
ALT: 19 U/L (ref 0–35)
AST: 42 U/L — ABNORMAL HIGH (ref 0–37)
Albumin: 4.6 g/dL (ref 3.5–5.2)
Alkaline Phosphatase: 76 U/L (ref 39–117)
BUN: 11 mg/dL (ref 6–23)
CO2: 27 mEq/L (ref 19–32)
Calcium: 9.9 mg/dL (ref 8.4–10.5)
Chloride: 102 mEq/L (ref 96–112)
Creatinine, Ser: 1.1 mg/dL (ref 0.40–1.20)
GFR: 55.94 mL/min — ABNORMAL LOW (ref >60.00)
Glucose, Bld: 88 mg/dL (ref 70–99)
Potassium: 4.5 mEq/L (ref 3.5–5.1)
Sodium: 138 mEq/L (ref 135–145)
Total Bilirubin: 0.5 mg/dL (ref 0.2–1.2)
Total Protein: 7.6 g/dL (ref 6.0–8.3)

## 2022-02-02 LAB — CBC WITH DIFFERENTIAL/PLATELET
Basophils Absolute: 0.1 10*3/uL (ref 0.0–0.1)
Basophils Relative: 0 % (ref 0.0–3.0)
Eosinophils Absolute: 0.8 10*3/uL — ABNORMAL HIGH (ref 0.0–0.7)
Eosinophils Relative: 0.5 % (ref 0.0–5.0)
HCT: 37.7 % (ref 36.0–46.0)
Hemoglobin: 11.5 g/dL — ABNORMAL LOW (ref 12.0–15.0)
Lymphocytes Relative: 95.3 % — ABNORMAL HIGH (ref 12.0–46.0)
Lymphs Abs: 168.2 10*3/uL — ABNORMAL HIGH (ref 0.7–4.0)
MCHC: 30.5 g/dL (ref 30.0–36.0)
MCV: 91.5 fl (ref 78.0–100.0)
Monocytes Absolute: 2.7 10*3/uL — ABNORMAL HIGH (ref 0.1–1.0)
Monocytes Relative: 1.5 % — ABNORMAL LOW (ref 3.0–12.0)
Neutro Abs: 4.7 10*3/uL (ref 1.4–7.7)
Neutrophils Relative %: 2.7 % — ABNORMAL LOW (ref 43.0–77.0)
Platelets: 156 10*3/uL (ref 150.0–400.0)
RBC: 4.12 Mil/uL (ref 3.87–5.11)
RDW: 17.3 % — ABNORMAL HIGH (ref 11.5–15.5)
WBC: 176.6 10*3/uL (ref 4.0–10.5)

## 2022-02-02 LAB — PHOSPHORUS: Phosphorus: 3.8 mg/dL (ref 2.3–4.6)

## 2022-02-02 LAB — URIC ACID: Uric Acid, Serum: 6.2 mg/dL (ref 2.4–7.0)

## 2022-02-02 NOTE — Addendum Note (Signed)
Addended by: Kelle Darting A on: 02/02/2022 11:52 AM   Modules accepted: Orders

## 2022-02-02 NOTE — Telephone Encounter (Signed)
Called pt to let her know about wbc ct She has not noted a fever  She feels like she is stable at this point.  I will call her team at [facility] once labs are actually visible on the chart- tomorrow

## 2022-02-02 NOTE — Telephone Encounter (Signed)
CRITICAL VALUE STICKER  CRITICAL VALUE: WBC 176,000  RECEIVER (on-site recipient of call): Odalys Win, RMA  DATE & TIME NOTIFIED: 02/02/2022 at 4:35 pm  MESSENGER (representative from lab): Rosie Fate   MD NOTIFIED: Lamar Blinks, MD  TIME OF NOTIFICATION: 4:54 pm  RESPONSE:

## 2022-02-03 LAB — LACTATE DEHYDROGENASE: LDH: 511 U/L — ABNORMAL HIGH (ref 120–250)

## 2022-02-05 ENCOUNTER — Encounter: Payer: Self-pay | Admitting: General Practice

## 2022-02-05 NOTE — Progress Notes (Signed)
[place] Spiritual Care Note  Connected with Morgan [name] by phone to process recent lab results and associated feelings, particularly her concerns about how her decision not to pursue treatment may affect her adult children.   [name] has been working hard to share her wishes and goals of care with close friends at church and is making plans for reconciliatory work with other people in her life.  Provided empathic listening, pastoral reflection, and prayer per request. Next appointment scheduled for Thursday 7/27 at 3:30 pm in [place] office to debrief about an upcoming appointment.   [place], [place], [facility] Pager [phone] Voicemail [phone]

## 2022-02-06 DIAGNOSIS — C9110 Chronic lymphocytic leukemia of B-cell type not having achieved remission: Secondary | ICD-10-CM | POA: Diagnosis not present

## 2022-02-06 DIAGNOSIS — R530 Neoplastic (malignant) related fatigue: Secondary | ICD-10-CM | POA: Diagnosis not present

## 2022-02-09 DIAGNOSIS — R918 Other nonspecific abnormal finding of lung field: Secondary | ICD-10-CM | POA: Diagnosis not present

## 2022-02-09 DIAGNOSIS — C9110 Chronic lymphocytic leukemia of B-cell type not having achieved remission: Secondary | ICD-10-CM | POA: Diagnosis not present

## 2022-02-09 DIAGNOSIS — R42 Dizziness and giddiness: Secondary | ICD-10-CM | POA: Diagnosis not present

## 2022-02-09 DIAGNOSIS — K580 Irritable bowel syndrome with diarrhea: Secondary | ICD-10-CM | POA: Diagnosis not present

## 2022-02-09 DIAGNOSIS — R3129 Other microscopic hematuria: Secondary | ICD-10-CM | POA: Diagnosis not present

## 2022-02-09 DIAGNOSIS — D801 Nonfamilial hypogammaglobulinemia: Secondary | ICD-10-CM | POA: Diagnosis not present

## 2022-02-09 DIAGNOSIS — R5383 Other fatigue: Secondary | ICD-10-CM | POA: Diagnosis not present

## 2022-02-09 DIAGNOSIS — R002 Palpitations: Secondary | ICD-10-CM | POA: Diagnosis not present

## 2022-02-09 DIAGNOSIS — D649 Anemia, unspecified: Secondary | ICD-10-CM | POA: Diagnosis not present

## 2022-02-09 DIAGNOSIS — R1012 Left upper quadrant pain: Secondary | ICD-10-CM | POA: Diagnosis not present

## 2022-02-09 DIAGNOSIS — R55 Syncope and collapse: Secondary | ICD-10-CM | POA: Diagnosis not present

## 2022-02-09 DIAGNOSIS — R590 Localized enlarged lymph nodes: Secondary | ICD-10-CM | POA: Diagnosis not present

## 2022-02-09 DIAGNOSIS — J45909 Unspecified asthma, uncomplicated: Secondary | ICD-10-CM | POA: Diagnosis not present

## 2022-02-09 DIAGNOSIS — E790 Hyperuricemia without signs of inflammatory arthritis and tophaceous disease: Secondary | ICD-10-CM | POA: Diagnosis not present

## 2022-02-09 DIAGNOSIS — Z87891 Personal history of nicotine dependence: Secondary | ICD-10-CM | POA: Diagnosis not present

## 2022-02-09 DIAGNOSIS — R61 Generalized hyperhidrosis: Secondary | ICD-10-CM | POA: Diagnosis not present

## 2022-02-10 DIAGNOSIS — D801 Nonfamilial hypogammaglobulinemia: Secondary | ICD-10-CM | POA: Diagnosis not present

## 2022-02-12 ENCOUNTER — Encounter: Payer: Self-pay | Admitting: General Practice

## 2022-02-12 NOTE — Progress Notes (Signed)
[place] Spiritual Care Note  Followed up with Morgan [name] in my office, providing opportunity for her to process news of her prognosis and all of its associated tasks, from navigating palliative care to exploring hospice to communicating with family and friends. Her son Morgan [name] and his Morgan [name] are flying in from [place] tomorrow to travel with Morgan [name] and her daughter Morgan [name] to the beach for a weekend away together.  Morgan [name] and I plan to meet in person or by Webex in ca two weeks to talk through details of her Celebration of Life.   [place], [place], [facility] Pager [phone] Voicemail [phone]

## 2022-02-17 DIAGNOSIS — C9110 Chronic lymphocytic leukemia of B-cell type not having achieved remission: Secondary | ICD-10-CM | POA: Diagnosis not present

## 2022-02-17 DIAGNOSIS — Z006 Encounter for examination for normal comparison and control in clinical research program: Secondary | ICD-10-CM | POA: Diagnosis not present

## 2022-02-17 DIAGNOSIS — R161 Splenomegaly, not elsewhere classified: Secondary | ICD-10-CM | POA: Diagnosis not present

## 2022-02-18 ENCOUNTER — Ambulatory Visit (INDEPENDENT_AMBULATORY_CARE_PROVIDER_SITE_OTHER): Admitting: Pharmacist

## 2022-02-18 ENCOUNTER — Encounter: Payer: Self-pay | Admitting: Family Medicine

## 2022-02-18 DIAGNOSIS — J45909 Unspecified asthma, uncomplicated: Secondary | ICD-10-CM

## 2022-02-18 DIAGNOSIS — Z5181 Encounter for therapeutic drug level monitoring: Secondary | ICD-10-CM

## 2022-02-18 DIAGNOSIS — C9110 Chronic lymphocytic leukemia of B-cell type not having achieved remission: Secondary | ICD-10-CM

## 2022-02-18 NOTE — Chronic Care Management (AMB) (Signed)
Chronic Care Management Pharmacy Note  02/18/2022 Name:  Morgan [name] MRN:  [id number] DOB:  Jan 24, 1966  Summary:  Unfortunately patient's CLL continues to progress and has not responded to treatment and / or patient was not able to tolerate needed treatment. Patient has decided to enroll in Hospice and anticipates enrollment later this week.  She has had her children with her recently and they assisted in medical decision making.  Patient had no medication needs at this time.   Plan: Hospice will be taking over her medication regimen and prescribing once enrolled. No further Chronic Care Management / Clinical Pharmacist Practitioner interventions needed at this time.   Subjective: Morgan [name] is an 56 y.o. year old female who is a primary patient of Copland, Gay Filler, MD.  The CCM team was consulted for assistance with disease management and care coordination needs.    Engaged with patient by telephone for follow up visit in response to provider referral for pharmacy case management and/or care coordination services.   Consent to Services:  The patient was given information about Chronic Care Management services, agreed to services, and gave verbal consent prior to initiation of services.  Please see initial visit note for detailed documentation.   Patient Care Team: Copland, Gay Filler, MD as PCP - General (Family Medicine) Binnie Rail, MD (Internal Medicine) Lanette Hampshire, MD as Referring Physician (Internal Medicine) Jeanann Lewandowsky, MD as Referring Physician (Oncology) Mcarthur Rossetti, MD as Consulting Physician (Orthopedic Surgery) Rockne Coons, MD (Infectious Diseases)  Recent office visits: 01/14/2022 - Fam Med (Dr Lorelei Pont) Seen for abdominal pain and hospital follow up. Labs ordered. Lipase elevated; amylase normal. WBC continues to be elevated.  07/31/2021 - PCP (Dr Lorelei Pont) Seen for foot callus, back and hip pain. OTC recommendations -  salicylic acid and urea for callusis on foot; Salonpas lidocaine patches for back pain as needed.   Recent consult visits: 02/17/2022 - Oncology - Wyn Forster, NP) F/U Chronic Lymphocytic leukemia. PET scan. 08/20/2021 -Neuro (lomax, NP) F/U migraines. No med changes. 08/15/2021 - Cardio (Dr Lora Paula) Seen for intermittant palpitation, Chest pain and dizziness. Loop recorder demonstrated NSR with periods of sinus tachycardia but no arrhythmias. No med changes noted.   08/14/2021 - [facility]: Recommendation: Eat a healthy diet based on the dietitian's recommendations. Daily exercise, 20-30 minutes, to increase energy and enhance sense of well-being. Ensure adequate sleep, 6-8 hours each night. Avoid caffeine or watching tv late at night. Drink 64 ounces of water daily.Care Plan: Nutrition coaching follow up; biweekly follow up with NP and RD; declines CL BH referral; declines bariatric surgery referral; has completed [facility] Weight History: 1. 198.5 lbs12/29/22  2. 198.3 lbs 08/04/21 3. 195.4 lbs 08/14/21 Long Term Goal Weight: 150 lbs Initial Weight goal: Initial 5% (~9.9 lbs) Amount of weight lost since the last appointment: -2.9 lbs Total weight lost: -3.1 lbs 08/11/2021 - Oncology (Dr Dorothea Ogle) See for F/U CLL. Continue IVIG and antiviral prophylaxis.  07/17/2021 - [facility] Clinic ([place]) [place], Damaris Schooner, FNP; Care Plan: NP visits weekly x 4 weeks then reevaluate, RD/Fitness/BH visits TBD. As a part of the [employer] program, as referred by MD, pt will receive Health Coaching. Complete a movement consult with the exercise specialist. She was offered a behavioral health assessment. She is already engaged in [facility] services elsewhere. No medication prescribed  Hospital visits: 12/27/2021 to 12/31/2021 Hospital admission for cellulitis of leg. Initially treated with IV Rocephin and then transitioned to oral  cefdinir at discharge gor 5 more days.  05/25/2021 ED Visit to [facility]. Seen for infected insect bites. Prescribed clindamycin 112m 3 times a day.  Objective:  Lab Results  Component Value Date   CREATININE 1.10 02/02/2022   CREATININE 1.07 01/26/2022   CREATININE 1.05 01/14/2022    Lab Results  Component Value Date   HGBA1C 5.2 12/27/2021   Last diabetic Eye exam: No results found for: "HMDIABEYEEXA"  Last diabetic Foot exam: No results found for: "HMDIABFOOTEX"      Component Value Date/Time   CHOL 262 (H) 01/02/2021 1519   TRIG 248.0 (H) 01/02/2021 1519   HDL 77.90 01/02/2021 1519   CHOLHDL 3 01/02/2021 1519   VLDL 49.6 (H) 01/02/2021 1519   LDLCALC 103 (H) 04/27/2018 1403   LDLDIRECT 140.0 01/02/2021 1519       Latest Ref Rng & Units 02/02/2022   11:49 AM 01/26/2022   10:01 AM 01/14/2022    2:42 PM  Hepatic Function  Total Protein 6.0 - 8.3 g/dL 7.6  7.5  7.7   Albumin 3.5 - 5.2 g/dL 4.6  4.4  4.7   AST 0 - 37 U/L 42  40  45   ALT 0 - 35 U/L 19  17  27    Alk Phosphatase 39 - 117 U/L 76  73  74   Total Bilirubin 0.2 - 1.2 mg/dL 0.5  0.4  0.5     Lab Results  Component Value Date/Time   TSH 4.03 01/14/2022 02:42 PM   TSH 5.086 (H) 12/27/2021 09:59 PM   TSH 3.51 03/17/2021 12:15 PM   FREET4 1.19 03/02/2018 03:24 PM   FREET4 0.65 04/17/2016 10:31 AM       Latest Ref Rng & Units 02/02/2022   11:49 AM 01/26/2022   10:01 AM 01/14/2022    2:42 PM  CBC  WBC 4.0 - 10.5 K/uL 176.6  141.9 Repeated and verified X2.  169.3 Repeated and verified X2.   Hemoglobin 12.0 - 15.0 g/dL 11.5  11.4  11.7   Hematocrit 36.0 - 46.0 % 37.7  35.8  37.4   Platelets 150.0 - 400.0 K/uL 156.0  118.0  146.0     Lab Results  Component Value Date/Time   VD25OH 43.91 11/20/2020 01:26 PM   VD25OH 25.73 (L) 06/17/2020 11:18 AM    Clinical ASCVD: No  The 10-year ASCVD risk score (Arnett DK, et al., 2019) is: 1.6%   Values used to calculate the score:     Age: [age]years     Sex: Female     Is Non-Hispanic African American: No     Diabetic: No      Tobacco smoker: No     Systolic Blood Pressure: 1003mmHg     Is BP treated: No     HDL Cholesterol: 77.9 mg/dL     Total Cholesterol: 262 mg/dL    Other: (CHADS2VASc if Afib, PHQ9 if depression, MMRC or CAT for COPD, ACT, DEXA)  Social History   Tobacco Use  Smoking Status Former   Types: Cigarettes   Quit date: 07/20/1980   Years since quitting: 41.6  Smokeless Tobacco Never  Tobacco Comments   Also exposed to second hand smoke as a child   BP Readings from Last 3 Encounters:  01/14/22 110/72  01/01/22 110/75  12/31/21 104/62   Pulse Readings from Last 3 Encounters:  01/14/22 78  01/01/22 89  12/31/21 63   Wt Readings from Last 3 Encounters:  01/14/22  178 lb 6.4 oz (80.9 kg)  01/01/22 180 lb (81.6 kg)  12/29/21 181 lb 10.5 oz (82.4 kg)    Assessment: Review of patient past medical history, allergies, medications, health status, including review of consultants reports, laboratory and other test data, was performed as part of comprehensive evaluation and provision of chronic care management services.   SDOH:  (Social Determinants of Health) assessments and interventions performed:  SDOH Interventions    Flowsheet Row Most Recent Value  SDOH Interventions   Physical Activity Interventions Intervention Not Indicated  [walking a little]        CCM Care Plan  Allergies  Allergen Reactions   Aripiprazole Anaphylaxis and Swelling   Ciprofloxacin Shortness Of Breath   Lamotrigine Rash and Other (See Comments)   Nitrofurantoin Hives   Other Anaphylaxis, Hives and Swelling    Walnuts and pine nuts   Peanut Oil Anaphylaxis, Hives, Swelling and Rash   Amoxicillin Hives and Rash    Update - pt states she has been tested by her allergist and is no longer allergic to penicillin 03/2021   Doxycycline Hives and Rash   Latex Rash and Other (See Comments)   Meloxicam Itching and Other (See Comments)    Induces mania Interacts with Lithium   Naproxen Hives and Other (See  Comments)    Interacts with lithium   Pramipexole Hives and Rash   Prednisone Other (See Comments)    Interacts with Lithium   Risperidone Hives and Rash   Sulfa Antibiotics Rash    "that leaves scarring"   Tape Itching and Rash    Steri-StripsT   Cortisone Other (See Comments)    Exacerbates the mania of her bipolar   Keflex [Cephalexin]     Pt developed a rash after taking high dose keflex for several days and Facial swelling   Percocet [Oxycodone-Acetaminophen] Other (See Comments)    Severe dizziness   Shingrix [Zoster Vac Recomb Adjuvanted] Swelling    rash   Vancomycin Itching    Itching on head and at IV insertion site where Vanc running   Cuvitru [Immune Globulin (Human)] Rash    Also allergic to Hizentra    Medications Reviewed Today     Reviewed by [name], RPH-CPP (Pharmacist) on 02/18/22 at 1021  Med List Status: <None>   Medication Order Taking? Sig Documenting Provider Last Dose Status Informant  acetaminophen (TYLENOL) 500 MG tablet [id number] Yes Take 1,000 mg by mouth every 6 (six) hours as needed for mild pain. [provider] Taking Active Self  albuterol (VENTOLIN HFA) 108 (90 Base) MCG/ACT inhaler [id number] Yes Inhale 1-2 puffs into the lungs every 6 (six) hours as needed for wheezing or shortness of breath. Copland, Gay Filler, MD Taking Active Self  aspirin-acetaminophen-caffeine Jearld Adjutant EXTRA STRENGTH) [phone] MG tablet [id number] Yes Take 2 tablets by mouth every 6 (six) hours as needed for headache. [provider] Taking Active Self  diphenhydrAMINE (BENADRYL) 25 MG tablet [id number] Yes Take 1 tablet (25 mg total) by mouth every 6 (six) hours as needed for itching.  Patient taking differently: Take 25 mg by mouth every 6 (six) hours as needed for itching. Uses as premed prior to IVIG   Horton, Barbette Hair, MD Taking Active Self  EPINEPHrine 0.3 mg/0.3 mL IJ SOAJ injection [id number] Yes Inject 0.3 mLs (0.3 mg total) into the muscle  as needed for anaphylaxis. Copland, Gay Filler, MD Taking Active Self  fexofenadine (ALLEGRA) 180 MG tablet [id number] Yes Take 180 mg by mouth  daily. [provider] Taking Active Self  fluticasone furoate-vilanterol (BREO ELLIPTA) 100-25 MCG/ACT AEPB [id number] Yes Inhale 1 puff into the lungs daily. Copland, Gay Filler, MD Taking Active Self  hyoscyamine (LEVSIN) 0.125 MG tablet [id number] Yes Take 1 tablet (0.125 mg total) by mouth as needed for cramping. Reported on 09/24/2015 Copland, Gay Filler, MD Taking Active Self  ibuprofen (ADVIL) 200 MG tablet [id number] Yes Take 200 mg by mouth every 6 (six) hours as needed (inflammation). [provider] Taking Active Self  Immune Globulin 10% (GAMUNEX-C) 10 GM/100ML SOLN [id number] Yes Inject into the vein. Every 3 weeks [provider] Taking Active Self  levothyroxine (SYNTHROID) 50 MCG tablet [id number] Yes Take 1 tablet (50 mcg total) by mouth daily before breakfast. Copland, Gay Filler, MD Taking Active Self  lidocaine-prilocaine (EMLA) cream [id number] Yes Apply 1 application  topically as needed (access port). [provider] Taking Active Self  ondansetron (ZOFRAN) 8 MG tablet [id number] Yes Take by mouth every 8 (eight) hours as needed for nausea or vomiting. [provider] Taking Active Self  rizatriptan (MAXALT-MLT) 10 MG disintegrating tablet [id number] Yes Take 1 tablet (10 mg total) by mouth as needed for migraine. May repeat in 2 hours if needed Debbora Presto, NP Taking Active Self            Patient Active Problem List   Diagnosis Date Noted   Cellulitis 12/27/2021   Throat discomfort 03/12/2020   Hypothyroidism 11/18/2018   Fever 06/18/2018   Piriformis syndrome of right side 04/22/2017   Lateral epicondylitis of left elbow 04/06/2017   Solitary pulmonary nodule 10/29/2016   Chronic lymphocytic leukemia ([place]) 07/31/2016   Mass in neck 04/17/2016   Abdominal mass 04/17/2016   Asthma 12/12/2015    Multiple environmental allergies 12/12/2015   Chronic lower back pain 09/12/2015   Bipolar 1 disorder, depressed ([place]) 08/27/2015    Class: Chronic   Borderline personality disorder ([place]) 08/15/2015   Severe benzodiazepine use disorder ([place]) 08/15/2015   Alcohol use disorder, moderate, dependence ([place]) 08/15/2015   PTSD (post-traumatic stress disorder) 08/15/2015   History of bipolar disorder 08/15/2015   Pre-syncope 05/17/2015   Skull deformity 05/17/2015   Patellofemoral syndrome of both knees 05/02/2015    Immunization History  Administered Date(s) Administered   Influenza, Quadrivalent, Recombinant, Inj, Pf 04/28/2018   Influenza,inj,Quad PF,6+ Mos 03/20/2019, 06/03/2021   Influenza-Unspecified 04/15/2015, 05/19/2017   Moderna Sars-Covid-2 Vaccination 10/03/2019, 11/03/2019, 06/08/2020   Tdap 04/15/2015   Zoster Recombinat (Shingrix) 02/23/2019    Conditions to be addressed/monitored: Hypertriglyceridemia, Bipolar Disorder, and asthma; GERD / abdominal cramping; hypothyroidism; CLL  Care Plan : General Pharmacy (Adult)  Updates made by [name], RPH-CPP since 02/18/2022 12:00 AM     Problem: Chronic Disease Management support, education, and care coordination needs related to Asthma, Hypothyroidism, Bipolar Disorder, Leukemia, Stomach Cramping, Recurrent Shingles, Migraine, Allergy      Long-Range Goal: Patient-Specific Goal Completed 02/18/2022  Start Date: 11/08/2020  Recent Progress: On track  Priority: High  Note:   Current Barriers:  Chronic Disease Management support, education, and care coordination needs related to Asthma, Hypothyroidism, Bipolar Disorder, Leukemia, Stomach Cramping, Recurrent Shingles, Migraine, Allergy  Pharmacist Clinical Goal(s):  Over the next 90 days, patient will achieve adherence to monitoring guidelines and medication adherence to achieve therapeutic efficacy through collaboration with PharmD and provider.   Interventions: 1:1  collaboration with Copland, Gay Filler, MD regarding development and update of comprehensive plan of care as evidenced by provider attestation and co-signature  Inter-disciplinary care team collaboration (see longitudinal plan of care) Comprehensive medication review performed; medication list updated in electronic medical record  Asthma: Controlled Current regimen:  Albuterol inhaler (ProAir) as needed Breo Ellipta 100-35mg 1 puff daily (uses daily only during allergy season) Patient tried to limit steroids due to CLL Patient reports she has not needed to use albuterol in the last month Continued episodes of awaking with SOB. Saw cardiologist and he has ordered sleep study. Patient has seen neuro and ordered sleep study.  Interventions:  Reviewed inhaler use / technique (at previous visit) Reviewed maintenance versus rescue inhaler. Reminded patient that BMemory Danceis maintenance inhaler - use every day; Albuterol is rescue inhaler and is used when you have shortness of breath or wheezing. Reminded patient to rinse mouth after using Breo to prevent infection / thrush.   Bipolar 1: Current regimen:  OTC lithium daily  Counseling Patient has restarted OTC lithium 263mdaily [facility] and also has biweekly video / telephone sessions with counselor and chaplain with [facility]  Interventions: Encouraged continued follow up with counselors and spiritual care  Migraine Headaches Controlled Saw neurology for f/u migraines 08/20/2021 Tried in past - imitrex Current regimen:  Rizatriptan 1040ms needed Excedrin migraine as needed Interventions: Recommended maintain migraine medication regimen and follow up as planned with neurologist  Hypothyroidism Improved - TSH now at therapeutic goal and patient reports energy level improved with change in levothyroxine dose from 93m63mo 50mc73m May 2022. Current regimen:  Levothyroxine  50mcg48mly Interventions: Maintain thyroid medication regimen   Acid Reflux / GERD / abdominal pain:  Improved Current regimen:  Pantoprazole 40mg d77m Hyoscamine 0.193mg - 62m as needed for stomach cramping Patient states she only fills hyoscyamine 10 tablets at a time due to cost.  Hyoscyamine is not on her [employer] formulary Interventions: Patient has Good Rx Card - recommended she use next time she fills hyoscyamine. Cost should be $8 for 30 tablets (cost savings of about $22) Continue current medications for GERD and abdominal pain  Medication management Current pharmacy: [facility] Interventions Comprehensive medication review performed. Updated medication list Reviewed refill history and adherence Continue current medication management strategy   Patient Goals/Self-Care Activities Over the next 90 days, patient will:  take medications as prescribed target a minimum of 150 minutes of moderate intensity exercise weekly Use Good Rx card with next refill of hyoscyamine      Medication Assistance: None required.  Patient affirms current coverage meets needs.  Patient's [facility]:  [phone]-[place]-#[id number] Manns00[address]OF HGall[facility] POIN[phone] LLCORE #[id number] -#[id number] Koyanagi90Reile[place] AcresUPITER [phone] Health CarrollwoodORE #[phone] [place] [phone] Memorial Hospital DistrictORE #[id number] -#[id number] Neth62WoolseyHYLAS [facility] N[address] OF PGrand Lak[phone]  [facility] #[id number] -[phone] VillageBATD[place]OF[place]  [place] [place] [address] Phone: [phone] Fax: [phone]    Follow Up:  Patient agrees to Care Plan and Follow-up.  Plan: No further follow up required: Patient to enroll in Hospice care  [name], PharmD Clinical Pharmacist [place] [phone]

## 2022-02-19 DIAGNOSIS — D801 Nonfamilial hypogammaglobulinemia: Secondary | ICD-10-CM | POA: Diagnosis not present

## 2022-02-21 NOTE — Progress Notes (Unsigned)
[place] at [facility] [address] [place], [place] [address] [phone]  Date:  02/25/2022   Name:  Morgan [name]   DOB:  10/06/1965   MRN:  [id number]  PCP:  Darreld Mclean, MD    Chief Complaint: No chief complaint on file.   History of Present Illness:  Morgan [name] is a 56 y.o. very pleasant female patient who presents with the following:  Virtual visit today to discuss next steps with CLL Patient location is home, my location is office.  Patient identity confirmed with 2 factors, she gives consent for virtual visit today The pt and myself are present on the call today  Her oncology care is mostly through [facility]- Dr Dorothea Ogle She saw Dr Dorothea Ogle 2 weeks ago with her son [name] Her WBC ct had gone up to 230k, and uric acid also up, anemia is progressing Morgan [name] notes that her treatment options are limited- at this time Morgan [name] has decided against further treatment.  Her prognosis is maybe 2 months At this time she does not wish to be on pain medication although she is in pain. She will reconsider this if things get worse  Hospice is coming to see her now-  She feels at peace and is trying to tie up some administrating loose ends   Most recent imaging 8/1-confirmed that her cancer activity has spread IMPRESSION: 1. Significant increase in size and metabolically active widespread adenopathy above and below the diaphragm with FDG activity markedly greater than that of liver ([place] 5). Of note, if tissue sampling is desired, the axillary lymph nodes are the most FDG avid and would be amenable. 2. Similar diffusely increased activity in the spleen, with notably increased splenomegaly, consistent with involvement. Patient Active Problem List   Diagnosis Date Noted   Cellulitis 12/27/2021   Throat discomfort 03/12/2020   Hypothyroidism 11/18/2018   Fever 06/18/2018   Piriformis syndrome of right side 04/22/2017   Lateral  epicondylitis of left elbow 04/06/2017   Solitary pulmonary nodule 10/29/2016   Chronic lymphocytic leukemia ([place]) 07/31/2016   Mass in neck 04/17/2016   Abdominal mass 04/17/2016   Asthma 12/12/2015   Multiple environmental allergies 12/12/2015   Chronic lower back pain 09/12/2015   Bipolar 1 disorder, depressed ([place]) 08/27/2015    Class: Chronic   Borderline personality disorder ([place]) 08/15/2015   Severe benzodiazepine use disorder ([place]) 08/15/2015   Alcohol use disorder, moderate, dependence ([place]) 08/15/2015   PTSD (post-traumatic stress disorder) 08/15/2015   History of bipolar disorder 08/15/2015   Pre-syncope 05/17/2015   Skull deformity 05/17/2015   Patellofemoral syndrome of both knees 05/02/2015    Past Medical History:  Diagnosis Date   Anxiety    Arthritis    Asthma    Bipolar disorder ([place])    Bulging lumbar disc 07/19/05   Chronic headaches    Chronic lymphocytic leukemia ([place]) 07/31/2016   Colon polyps    Degenerative disorder of bone    Depression    History of borderline personality disorder    Hypothyroidism 2007   Developed after use of Lithium   IBS (irritable bowel syndrome) 1995   Pneumonia    Proctitis    Shingles 07/2018   Substance abuse ([place])    ativan last month    Past Surgical History:  Procedure Laterality Date   ABDOMINAL HYSTERECTOMY     APPENDECTOMY     BUNIONECTOMY Right 06/2012   CHOLECYSTECTOMY  OOPHORECTOMY     SHOULDER OPEN ROTATOR CUFF REPAIR Right 03/2010   TUBAL LIGATION      Social History   Tobacco Use   Smoking status: Former    Types: Cigarettes    Quit date: 07/20/1980    Years since quitting: 41.6   Smokeless tobacco: Never   Tobacco comments:    Also exposed to second hand smoke as a child  Vaping Use   Vaping Use: Never used  Substance Use Topics   Alcohol use: Not Currently    Comment: occ   Drug use: Not Currently    Family History  Problem Relation Age of Onset   Depression Mother     Hypertension Mother    Thyroid disease Father    Alzheimer's disease Father    Post-traumatic stress disorder Sister    Alcohol abuse Brother    Drug abuse Brother    Post-traumatic stress disorder Brother    COPD Maternal Grandmother    Heart disease Maternal Grandfather    Arthritis Paternal Grandmother    Diabetes Paternal Grandmother    Depression Paternal Grandmother    Alzheimer's disease Paternal Grandmother    Heart disease Paternal Grandfather    Coronary artery disease Paternal Grandfather    Alcohol abuse Paternal Grandfather    Sleep apnea Cousin    Diabetes Maternal Uncle    Colon cancer Neg Hx    Breast cancer Neg Hx     Allergies  Allergen Reactions   Aripiprazole Anaphylaxis and Swelling   Ciprofloxacin Shortness Of Breath   Lamotrigine Rash and Other (See Comments)   Nitrofurantoin Hives   Other Anaphylaxis, Hives and Swelling    Walnuts and pine nuts   Peanut Oil Anaphylaxis, Hives, Swelling and Rash   Amoxicillin Hives and Rash    Update - pt states she has been tested by her allergist and is no longer allergic to penicillin 03/2021   Doxycycline Hives and Rash   Latex Rash and Other (See Comments)   Meloxicam Itching and Other (See Comments)    Induces mania Interacts with Lithium   Naproxen Hives and Other (See Comments)    Interacts with lithium   Pramipexole Hives and Rash   Prednisone Other (See Comments)    Interacts with Lithium   Risperidone Hives and Rash   Sulfa Antibiotics Rash    "that leaves scarring"   Tape Itching and Rash    Steri-StripsT   Cortisone Other (See Comments)    Exacerbates the mania of her bipolar   Keflex [Cephalexin]     Pt developed a rash after taking high dose keflex for several days and Facial swelling   Percocet [Oxycodone-Acetaminophen] Other (See Comments)    Severe dizziness   Shingrix [Zoster Vac Recomb Adjuvanted] Swelling    rash   Vancomycin Itching    Itching on head and at IV insertion site where  Vanc running   Cuvitru [Immune Globulin (Human)] Rash    Also allergic to Hizentra    Medication list has been reviewed and updated.  Current Outpatient Medications on File Prior to Visit  Medication Sig Dispense Refill   acetaminophen (TYLENOL) 500 MG tablet Take 1,000 mg by mouth every 6 (six) hours as needed for mild pain.     albuterol (VENTOLIN HFA) 108 (90 Base) MCG/ACT inhaler Inhale 1-2 puffs into the lungs every 6 (six) hours as needed for wheezing or shortness of breath. 18 g 6   allopurinol (ZYLOPRIM) 300 MG tablet Take 300  mg by mouth daily.     aspirin-acetaminophen-caffeine (EXCEDRIN EXTRA STRENGTH) 250-250-65 MG tablet Take 2 tablets by mouth every 6 (six) hours as needed for headache.     diphenhydrAMINE (BENADRYL) 25 MG tablet Take 1 tablet (25 mg total) by mouth every 6 (six) hours as needed for itching. (Patient taking differently: Take 25 mg by mouth every 6 (six) hours as needed for itching. Uses as premed prior to IVIG) 20 tablet 0   EPINEPHrine 0.3 mg/0.3 mL IJ SOAJ injection Inject 0.3 mLs (0.3 mg total) into the muscle as needed for anaphylaxis. 1 each prn   fexofenadine (ALLEGRA) 180 MG tablet Take 180 mg by mouth daily.     fluticasone furoate-vilanterol (BREO ELLIPTA) 100-25 MCG/ACT AEPB Inhale 1 puff into the lungs daily. 1 each 11   hyoscyamine (LEVSIN) 0.125 MG tablet Take 1 tablet (0.125 mg total) by mouth as needed for cramping. Reported on 09/24/2015 10 tablet 2   ibuprofen (ADVIL) 200 MG tablet Take 200 mg by mouth every 6 (six) hours as needed (inflammation).     Immune Globulin 10% (GAMUNEX-C) 10 GM/100ML SOLN Inject into the vein. Every 3 weeks     levothyroxine (SYNTHROID) 50 MCG tablet Take 1 tablet (50 mcg total) by mouth daily before breakfast. 90 tablet 3   lidocaine-prilocaine (EMLA) cream Apply 1 application  topically as needed (access port).     ondansetron (ZOFRAN) 8 MG tablet Take by mouth every 8 (eight) hours as needed for nausea or vomiting.      rizatriptan (MAXALT-MLT) 10 MG disintegrating tablet Take 1 tablet (10 mg total) by mouth as needed for migraine. May repeat in 2 hours if needed 9 tablet 11   No current facility-administered medications on file prior to visit.    Review of Systems:  As per HPI- otherwise negative.   Physical Examination: There were no vitals filed for this visit. There were no vitals filed for this visit. There is no height or weight on file to calculate BMI. Ideal Body Weight:    Spoke with pt over video monitor- she looks well and her usual self   Assessment and Plan: CLL (chronic lymphocytic leukemia) ([place])  Terminal illness  Spent some time talking with patient today about her next steps and her plans for making the most of the time she has left, and for end of life.  Offered support and we enjoyed sharing some memories  and good thoughts today.  She will contact me if we can do anything to help her   Signed Lamar Blinks, MD

## 2022-02-24 ENCOUNTER — Encounter: Payer: Self-pay | Admitting: General Practice

## 2022-02-24 NOTE — Progress Notes (Signed)
[place] Spiritual Care Note  Had Spiritual Care Virtual Visit via webex to allow Morgan [name] space to process her recent hospice admission and special family beach trip for connection and memory-making. Provided pastoral presence, empathic listening, and emotional support. Morgan [name] plans to reach out again as needed.   [place], [place], [facility] Pager [phone] Voicemail [phone]

## 2022-02-25 ENCOUNTER — Telehealth (INDEPENDENT_AMBULATORY_CARE_PROVIDER_SITE_OTHER): Admitting: Family Medicine

## 2022-02-25 DIAGNOSIS — R69 Illness, unspecified: Secondary | ICD-10-CM

## 2022-02-25 DIAGNOSIS — C9110 Chronic lymphocytic leukemia of B-cell type not having achieved remission: Secondary | ICD-10-CM

## 2022-03-02 ENCOUNTER — Encounter: Payer: Self-pay | Admitting: General Practice

## 2022-03-02 NOTE — Progress Notes (Signed)
[place] Spiritual Care Note  Returned call from [place], scheduling Spiritual Care Virtual Visit for Friday 8/18 at 11 am. She notes that she found reading the "Gone from My Sight" booklet helpful and is  continuing to work on relationship/communication tasks and EOL arrangements.    [place], [place], [facility] Pager [phone] Voicemail [phone]

## 2022-03-06 ENCOUNTER — Encounter: Payer: Self-pay | Admitting: General Practice

## 2022-03-06 ENCOUNTER — Ambulatory Visit: Payer: Medicare Other

## 2022-03-06 NOTE — Progress Notes (Signed)
[facility] Spiritual Care Note  Had Spiritual Care Virtual Visit with Morgan [name] as planned. She used the opportunity to share and process how she is attending to EOL tasks and goals, including how she is talking with her support groups about entering hospice care and what she hopes for her time with her children this weekend [name] and fiance Legrand Como are local; [name] and Judene Companion are flying in from [name]).  Provided empathic listening, emotional support, pastoral reflection, and normalization of feelings.  We scheduled a follow-up virtual visit for next Friday 03/13/2022 at [place], [place], [facility] Pager [phone] Voicemail [phone]

## 2022-03-13 ENCOUNTER — Encounter: Payer: Self-pay | Admitting: General Practice

## 2022-03-13 NOTE — Progress Notes (Signed)
[place] Note  Met in person instead of virtually per Margeret's request. Held space for her to process her recent visit with her children and their partners, continued health changes, details for her Celebration of Life, a faith encouragement video that she made to share, and other tasks preparing for EOL.  Maanya is balancing her diminishing energy and increased pain with her deep desire to make meaningful contributions to family, friends, church, and [employer] (through participating in the annual fundraising event Light the Night).  Provided empathic listening, emotional and spiritual support, affirmation of strengths, and pastoral reflection.  Malva plans to reach out to schedule another meeting.   [place], [place], [facility] Pager [phone] Voicemail [phone]

## 2022-03-19 DIAGNOSIS — J45909 Unspecified asthma, uncomplicated: Secondary | ICD-10-CM | POA: Diagnosis not present

## 2022-03-19 DIAGNOSIS — E039 Hypothyroidism, unspecified: Secondary | ICD-10-CM

## 2022-03-19 DIAGNOSIS — F319 Bipolar disorder, unspecified: Secondary | ICD-10-CM

## 2022-03-21 ENCOUNTER — Encounter (HOSPITAL_BASED_OUTPATIENT_CLINIC_OR_DEPARTMENT_OTHER): Payer: Self-pay | Admitting: Emergency Medicine

## 2022-03-21 ENCOUNTER — Emergency Department (HOSPITAL_BASED_OUTPATIENT_CLINIC_OR_DEPARTMENT_OTHER)
Admission: EM | Admit: 2022-03-21 | Discharge: 2022-03-21 | Disposition: A | Discharge status: Home / Self Care | Payer: Medicare Other | Attending: Emergency Medicine | Admitting: Emergency Medicine

## 2022-03-21 DIAGNOSIS — L03113 Cellulitis of right upper limb: Secondary | ICD-10-CM | POA: Diagnosis not present

## 2022-03-21 DIAGNOSIS — R000 Tachycardia, unspecified: Secondary | ICD-10-CM | POA: Diagnosis not present

## 2022-03-21 DIAGNOSIS — L03221 Cellulitis of neck: Secondary | ICD-10-CM | POA: Insufficient documentation

## 2022-03-21 DIAGNOSIS — L03114 Cellulitis of left upper limb: Secondary | ICD-10-CM | POA: Insufficient documentation

## 2022-03-21 DIAGNOSIS — L989 Disorder of the skin and subcutaneous tissue, unspecified: Secondary | ICD-10-CM | POA: Diagnosis present

## 2022-03-21 DIAGNOSIS — Z856 Personal history of leukemia: Secondary | ICD-10-CM | POA: Insufficient documentation

## 2022-03-21 DIAGNOSIS — L0390 Cellulitis, unspecified: Secondary | ICD-10-CM

## 2022-03-21 MED ORDER — CEFDINIR 300 MG PO CAPS: 300.0000 mg | ORAL_CAPSULE | Freq: Two times a day (BID) | ORAL | 0 refills | Status: AC

## 2022-03-21 MED ORDER — CEFDINIR 300 MG PO CAPS
300.0000 mg | ORAL_CAPSULE | Freq: Once | ORAL | Status: AC
  Administered 2022-03-21: 300 mg via ORAL
  Filled 2022-03-21: qty 1

## 2022-03-21 MED ORDER — HYDROXYZINE HCL 25 MG PO TABS
25.0000 mg | ORAL_TABLET | Freq: Once | ORAL | Status: AC
  Administered 2022-03-21: 25 mg via ORAL
  Filled 2022-03-21: qty 1

## 2022-03-21 MED ORDER — HYDROXYZINE HCL 25 MG PO TABS: 25.0000 mg | ORAL_TABLET | Freq: Three times a day (TID) | ORAL | 0 refills | Status: DC | PRN

## 2022-03-21 NOTE — Discharge Instructions (Addendum)
You were prescribed cefdinir for the cellulitis/skin infection.  You received your first dose in the emergency room.  I have sent Atarax, and cefdinir into the pharmacy for you.  You have tolerated this antibiotic in the past without difficulty.  For worsening symptoms please return to the emergency room.  If you do not have improvement on these antibiotics after being on these for 2 days please return for evaluation.  Blood cultures were obtained today.  If these are positive you will get a call.

## 2022-03-21 NOTE — ED Triage Notes (Signed)
Pt presents with multiple mosquito bites all over body that occurred yesterday. States today bites have turned into blisters, some dark brown in color. Also endorses chills and intermittent fevers.

## 2022-03-21 NOTE — ED Notes (Signed)
Pt is complaining of bug bites that happened yesterday, now are blistered on ears face arms and trunk.  Has used benedryl and cortisone cream.

## 2022-03-21 NOTE — ED Provider Notes (Signed)
Morgan [name] Provider Note   CSN: [id number] Arrival date & time: 03/21/22  1904     History  Chief Complaint  Patient presents with   Insect Bite   Blister    Morgan [name] is a 56 y.o. female.  56 year old female with past medical history significant for CLL presents today following multiple mosquito bites that took place yesterday while patient was visiting the healing garden at [facility].  She endorses chills, and fever at home with a Tmax of 100.4.  Patient's temperature here without taken Tylenol prior to arrival is 99.4.  Has lesions to bilateral lower extremities, bilateral upper extremities, right ear, neck, back.  She has blisters that she states is typical after she gets mosquito bites as well as the surrounding erythema, warmth, and streaking.  There is brown discoloration to her left hand which she states is not typical.  She endorses some sore throat however states this is typical and chronic with her CLL.  Denies sinus congestion, chest pain, shortness of breath, abdominal pain.  Does endorse slight nausea but no vomiting this morning when she had fever.  The history is provided by the patient. No language interpreter was used.       Home Medications Prior to Admission medications   Medication Sig Start Date End Date Taking? Authorizing Provider  acetaminophen (TYLENOL) 500 MG tablet Take 1,000 mg by mouth every 6 (six) hours as needed for mild pain.    [provider]  albuterol (VENTOLIN HFA) 108 (90 Base) MCG/ACT inhaler Inhale 1-2 puffs into the lungs every 6 (six) hours as needed for wheezing or shortness of breath. 11/24/21   Copland, Gay Filler, MD  allopurinol (ZYLOPRIM) 300 MG tablet Take 300 mg by mouth daily. 02/09/22   [provider]  aspirin-acetaminophen-caffeine (EXCEDRIN EXTRA STRENGTH) [phone] MG tablet Take 2 tablets by mouth every 6 (six) hours as needed for headache.    [provider]   diphenhydrAMINE (BENADRYL) 25 MG tablet Take 1 tablet (25 mg total) by mouth every 6 (six) hours as needed for itching. Patient taking differently: Take 25 mg by mouth every 6 (six) hours as needed for itching. Uses as premed prior to IVIG 02/27/19   Horton, Barbette Hair, MD  EPINEPHrine 0.3 mg/0.3 mL IJ SOAJ injection Inject 0.3 mLs (0.3 mg total) into the muscle as needed for anaphylaxis. 01/04/20   Copland, Gay Filler, MD  fexofenadine (ALLEGRA) 180 MG tablet Take 180 mg by mouth daily.    [provider]  fluticasone furoate-vilanterol (BREO ELLIPTA) 100-25 MCG/ACT AEPB Inhale 1 puff into the lungs daily. 11/24/21   Copland, Gay Filler, MD  hyoscyamine (LEVSIN) 0.125 MG tablet Take 1 tablet (0.125 mg total) by mouth as needed for cramping. Reported on 09/24/2015 02/17/21   Copland, Gay Filler, MD  ibuprofen (ADVIL) 200 MG tablet Take 200 mg by mouth every 6 (six) hours as needed (inflammation).    [provider]  Immune Globulin 10% (GAMUNEX-C) 10 GM/100ML SOLN Inject into the vein. Every 3 weeks    [provider]  levothyroxine (SYNTHROID) 50 MCG tablet Take 1 tablet (50 mcg total) by mouth daily before breakfast. 11/23/21   Copland, Gay Filler, MD  lidocaine-prilocaine (EMLA) cream Apply 1 application  topically as needed (access port).    [provider]  ondansetron (ZOFRAN) 8 MG tablet Take by mouth every 8 (eight) hours as needed for nausea or vomiting.    [provider]  rizatriptan (MAXALT-MLT)  10 MG disintegrating tablet Take 1 tablet (10 mg total) by mouth as needed for migraine. May repeat in 2 hours if needed 08/20/21   Lomax, Amy, NP      Allergies    Aripiprazole, Ciprofloxacin, Lamotrigine, Nitrofurantoin, Other, Peanut oil, Amoxicillin, Doxycycline, Latex, Meloxicam, Naproxen, Pramipexole, Prednisone, Risperidone, Sulfa antibiotics, Tape, Cortisone, Keflex [cephalexin], Percocet [oxycodone-acetaminophen], Shingrix [zoster vac recomb adjuvanted],  Vancomycin, and Cuvitru [immune globulin (human)]    Review of Systems   Review of Systems  Constitutional:  Positive for chills and fever. Negative for diaphoresis.  Respiratory:  Negative for shortness of breath.   Cardiovascular:  Negative for chest pain.  Gastrointestinal:  Positive for nausea. Negative for abdominal pain and vomiting.  Genitourinary:  Negative for dysuria.  Skin:  Positive for rash and wound.  Neurological:  Negative for weakness and light-headedness.  All other systems reviewed and are negative.   Physical Exam Updated Vital Signs BP 113/67 (BP Location: Right Arm)   Pulse (!) 110   Temp 99.4 F (37.4 C) (Oral)   Resp 17   Ht '5\' 3"'$  (1.6 m)   Wt 75.3 kg   SpO2 95%   BMI 29.41 kg/m  Physical Exam Vitals and nursing note reviewed.  Constitutional:      General: She is not in acute distress.    Appearance: Normal appearance. She is not ill-appearing.  HENT:     Head: Normocephalic and atraumatic.     Nose: Nose normal.  Eyes:     General: No scleral icterus.    Extraocular Movements: Extraocular movements intact.     Conjunctiva/sclera: Conjunctivae normal.  Cardiovascular:     Rate and Rhythm: Regular rhythm. Tachycardia present.     Pulses: Normal pulses.  Pulmonary:     Effort: Pulmonary effort is normal. No respiratory distress.     Breath sounds: Normal breath sounds. No wheezing or rales.  Abdominal:     General: There is no distension.     Tenderness: There is no abdominal tenderness.  Musculoskeletal:        General: Normal range of motion.     Cervical back: Normal range of motion.     Right lower leg: No edema.     Left lower leg: No edema.  Skin:    General: Skin is warm and dry.     Comments: Multiple lesions noted to bilateral upper extremities, lower extremities, neck, ear.  See attached images under media tab for further description.  There is streaking noted to bilateral upper and lower extremities.  Neurological:     General:  No focal deficit present.     Mental Status: She is alert. Mental status is at baseline.     ED Results / Procedures / Treatments   Labs (all labs ordered are listed, but only abnormal results are displayed) Labs Reviewed - No data to display  EKG None  Radiology No results found.  Procedures Procedures    Medications Ordered in ED Medications - No data to display  ED Course/ Medical Decision Making/ A&P                           Medical Decision Making  Medical Decision Making / ED Course   This patient presents to the ED for concern of mosquito bite, this involves an extensive number of treatment options, and is a complaint that carries with it a high risk of complications and morbidity.  The differential diagnosis  includes cellulitis  MDM: 56 year old female with past medical history significant for CLL presents with above-mentioned complaints.  No fever in the emergency [name].  She is tachycardic.  Overall well-appearing.  She states some of the features including streaking, significant erythema, warmth, and blisters are typical following mosquito bites.  She has had multiple episodes in the past.  This typically improves with over-the-counter treatment including Benadryl, topical cream, Pepcid and antibiotics.  She states that The brown discoloration she has to her right hand at the base of the thumb is not usual for these episodes.  She states that fever and chills are typical for these reactions however typically not as high as 100.4 as with this episode.  Without other signs or symptoms and history or physical exam that would explain fever.  Patient at baseline has leukocytosis of about 230,000.  Given sniffing and leukocytosis we will defer CBC at this time.  Will obtain blood cultures to evaluate for bacteremia.  Will prescribe cefdinir.  She has all the over-the-counter medicines including tetracaine ointment she will continue using.  Patient is on hospice.  We will  also give Atarax for itching.  Patient voices understanding and is in agreement with plan.  Does want to avoid hospitalization given her immunocompromised status.  Will return if she is without improvement on antibiotics, or sooner if symptoms get worse.  Patient is appropriate for discharge.  Discharged in stable condition. Discussed with Dr. Melina Copa.    Additional history obtained: -Additional history obtained from recent admission in June 2023 where patient was discharged on cefdinir.  She tolerated this without difficulty. -External records from outside source obtained and reviewed including: Chart review including previous notes, labs, imaging, consultation notes   Lab Tests: -I ordered, reviewed, and interpreted labs.   The pertinent results include:   Labs Reviewed - No data to display    EKG  EKG Interpretation  Date/Time:    Ventricular Rate:    PR Interval:    QRS Duration:   QT Interval:    QTC Calculation:   R Axis:     Text Interpretation:           Medicines ordered and prescription drug management: No orders of the defined types were placed in this encounter.   -I have reviewed the patients home medicines and have made adjustments as needed  Reevaluation: After the interventions noted above, I reevaluated the patient and found that they have :stayed the same  Co morbidities that complicate the patient evaluation  Past Medical History:  Diagnosis Date   Anxiety    Arthritis    Asthma    Bipolar disorder ([place])    Bulging lumbar disc 07/19/05   Chronic headaches    Chronic lymphocytic leukemia ([place]) 07/31/2016   Colon polyps    Degenerative disorder of bone    Depression    History of borderline personality disorder    Hypothyroidism 2007   Developed after use of Lithium   IBS (irritable bowel syndrome) 1995   Pneumonia    Proctitis    Shingles 07/2018   Substance abuse ([place])    ativan last month      Dispostion: Patient is stable for  discharge.  Discharged in stable condition.  Return precautions discussed.   Final Clinical Impression(s) / ED Diagnoses Final diagnoses:  Cellulitis, unspecified cellulitis site    Rx / DC Orders ED Discharge Orders          Ordered    cefdinir (OMNICEF)  300 MG capsule  2 times daily       Note to Pharmacy: Has tolerated cefdinir in June 2023 without difficulty.   03/21/22 2127    hydrOXYzine (ATARAX) 25 MG tablet  Every 8 hours PRN        03/21/22 2127              Evlyn Courier, PA-C 03/21/22 2128    Hayden Rasmussen, MD 03/22/22 1005

## 2022-03-24 NOTE — Progress Notes (Addendum)
[place] at [facility] [address] [place], [place] [address] [phone]  Date:  03/25/2022   Name:  Morgan [name]   DOB:  16-Oct-1965   MRN:  [id number]  PCP:  Darreld Mclean, MD    Chief Complaint: No chief complaint on file.   History of Present Illness:  Morgan [name] is a 56 y.o. very pleasant female patient who presents with the following:  Virtual visit today for follow-up Patient location is home, my location is office.  The patient and myself are present on the call today Her most recent visit was also a virtual visit on 8/9 -Patient has advanced CLL, she recently has transition to hospice care.  Most recent visit with oncology was at 8/23  She was seen in the ER 9/2 (Saturday- today is Wednesday) with apparent bug bites that had become infected: MDM: 56 year old female with past medical history significant for CLL presents with above-mentioned complaints.  No fever in the emergency department.  She is tachycardic.  Overall well-appearing.  She states some of the features including streaking, significant erythema, warmth, and blisters are typical following mosquito bites.  She has had multiple episodes in the past.  This typically improves with over-the-counter treatment including Benadryl, topical cream, Pepcid and antibiotics.  She states that The brown discoloration she has to her right hand at the base of the thumb is not usual for these episodes.  She states that fever and chills are typical for these reactions however typically not as high as 100.4 as with this episode.  Without other signs or symptoms and history or physical exam that would explain fever.  Patient at baseline has leukocytosis of about 230,000.  Given sniffing and leukocytosis we will defer CBC at this time.  Will obtain blood cultures to evaluate for bacteremia.  Will prescribe cefdinir.  She has all the over-the-counter medicines including tetracaine  ointment she will continue using.  Patient is on hospice.  We will also give Atarax for itching.  Patient voices understanding and is in agreement with plan.  Does want to avoid hospitalization given her immunocompromised status.  Will return if she is without improvement on antibiotics, or sooner if symptoms get worse.  Patient is appropriate for discharge.  Discharged in stable condition. Discussed with Dr. Melina Copa   She sent me some photos of her bites from yesterday which are reviewed; both from video monitor and patient's report her bites do seem better today She notes a lot of pain from the bite sites-was worse yesterday, now improving She has used some tylenol Her ear is the most painful and bothersome  She is on cefdinir and is using some anti-itch cream a friend gave her   She has noted low grade temp of around 99 Blood cultures from 9/2 are still negative  She does have some tramadol on hand that she can use as needed- from hospice   Patient Active Problem List   Diagnosis Date Noted   Cellulitis 12/27/2021   Throat discomfort 03/12/2020   Hypothyroidism 11/18/2018   Fever 06/18/2018   Piriformis syndrome of right side 04/22/2017   Lateral epicondylitis of left elbow 04/06/2017   Solitary pulmonary nodule 10/29/2016   Chronic lymphocytic leukemia ([place]) 07/31/2016   Mass in neck 04/17/2016   Abdominal mass 04/17/2016   Asthma 12/12/2015   Multiple environmental allergies 12/12/2015   Chronic lower back pain 09/12/2015   Bipolar 1 disorder, depressed ([place]) 08/27/2015  Class: Chronic   Borderline personality disorder ([place]) 08/15/2015   Severe benzodiazepine use disorder ([place]) 08/15/2015   Alcohol use disorder, moderate, dependence ([place]) 08/15/2015   PTSD (post-traumatic stress disorder) 08/15/2015   History of bipolar disorder 08/15/2015   Pre-syncope 05/17/2015   Skull deformity 05/17/2015   Patellofemoral syndrome of both knees 05/02/2015    Past Medical History:   Diagnosis Date   Anxiety    Arthritis    Asthma    Bipolar disorder ([place])    Bulging lumbar disc 07/19/05   Chronic headaches    Chronic lymphocytic leukemia ([place]) 07/31/2016   Colon polyps    Degenerative disorder of bone    Depression    History of borderline personality disorder    Hypothyroidism 2007   Developed after use of Lithium   IBS (irritable bowel syndrome) 1995   Pneumonia    Proctitis    Shingles 07/2018   Substance abuse ([place])    ativan last month    Past Surgical History:  Procedure Laterality Date   ABDOMINAL HYSTERECTOMY     APPENDECTOMY     BUNIONECTOMY Right 06/2012   CHOLECYSTECTOMY     OOPHORECTOMY     SHOULDER OPEN ROTATOR CUFF REPAIR Right 03/2010   TUBAL LIGATION      Social History   Tobacco Use   Smoking status: Former    Types: Cigarettes    Quit date: 07/20/1980    Years since quitting: 41.7   Smokeless tobacco: Never   Tobacco comments:    Also exposed to second hand smoke as a child  Vaping Use   Vaping Use: Never used  Substance Use Topics   Alcohol use: Not Currently    Comment: occ   Drug use: Not Currently    Family History  Problem Relation Age of Onset   Depression Mother    Hypertension Mother    Thyroid disease Father    Alzheimer's disease Father    Post-traumatic stress disorder Sister    Alcohol abuse Brother    Drug abuse Brother    Post-traumatic stress disorder Brother    COPD Maternal Grandmother    Heart disease Maternal Grandfather    Arthritis Paternal Grandmother    Diabetes Paternal Grandmother    Depression Paternal Grandmother    Alzheimer's disease Paternal Grandmother    Heart disease Paternal Grandfather    Coronary artery disease Paternal Grandfather    Alcohol abuse Paternal Grandfather    Sleep apnea Cousin    Diabetes Maternal Uncle    Colon cancer Neg Hx    Breast cancer Neg Hx     Allergies  Allergen Reactions   Aripiprazole Anaphylaxis and Swelling   Ciprofloxacin Shortness Of  Breath   Lamotrigine Rash and Other (See Comments)   Nitrofurantoin Hives   Other Anaphylaxis, Hives and Swelling    Walnuts and pine nuts   Peanut Oil Anaphylaxis, Hives, Swelling and Rash   Amoxicillin Hives and Rash    Update - pt states she has been tested by her allergist and is no longer allergic to penicillin 03/2021   Doxycycline Hives and Rash   Latex Rash and Other (See Comments)   Meloxicam Itching and Other (See Comments)    Induces mania Interacts with Lithium   Naproxen Hives and Other (See Comments)    Interacts with lithium   Pramipexole Hives and Rash   Prednisone Other (See Comments)    Interacts with Lithium   Risperidone Hives and Rash  Sulfa Antibiotics Rash    "that leaves scarring"   Tape Itching and Rash    Steri-StripsT   Cortisone Other (See Comments)    Exacerbates the mania of her bipolar   Keflex [Cephalexin]     Pt developed a rash after taking high dose keflex for several days and Facial swelling   Percocet [Oxycodone-Acetaminophen] Other (See Comments)    Severe dizziness   Shingrix [Zoster Vac Recomb Adjuvanted] Swelling    rash   Vancomycin Itching    Itching on head and at IV insertion site where Vanc running   Cuvitru [Immune Globulin (Human)] Rash    Also allergic to Hizentra    Medication list has been reviewed and updated.  Current Outpatient Medications on File Prior to Visit  Medication Sig Dispense Refill   acetaminophen (TYLENOL) 500 MG tablet Take 1,000 mg by mouth every 6 (six) hours as needed for mild pain.     albuterol (VENTOLIN HFA) 108 (90 Base) MCG/ACT inhaler Inhale 1-2 puffs into the lungs every 6 (six) hours as needed for wheezing or shortness of breath. 18 g 6   allopurinol (ZYLOPRIM) 300 MG tablet Take 300 mg by mouth daily.     aspirin-acetaminophen-caffeine (EXCEDRIN EXTRA STRENGTH) 250-250-65 MG tablet Take 2 tablets by mouth every 6 (six) hours as needed for headache.     cefdinir (OMNICEF) 300 MG capsule Take 1  capsule (300 mg total) by mouth 2 (two) times daily for 7 days. 14 capsule 0   diphenhydrAMINE (BENADRYL) 25 MG tablet Take 1 tablet (25 mg total) by mouth every 6 (six) hours as needed for itching. (Patient taking differently: Take 25 mg by mouth every 6 (six) hours as needed for itching. Uses as premed prior to IVIG) 20 tablet 0   EPINEPHrine 0.3 mg/0.3 mL IJ SOAJ injection Inject 0.3 mLs (0.3 mg total) into the muscle as needed for anaphylaxis. 1 each prn   fexofenadine (ALLEGRA) 180 MG tablet Take 180 mg by mouth daily.     fluticasone furoate-vilanterol (BREO ELLIPTA) 100-25 MCG/ACT AEPB Inhale 1 puff into the lungs daily. 1 each 11   hydrOXYzine (ATARAX) 25 MG tablet Take 1 tablet (25 mg total) by mouth every 8 (eight) hours as needed for itching. 20 tablet 0   hyoscyamine (LEVSIN) 0.125 MG tablet Take 1 tablet (0.125 mg total) by mouth as needed for cramping. Reported on 09/24/2015 10 tablet 2   ibuprofen (ADVIL) 200 MG tablet Take 200 mg by mouth every 6 (six) hours as needed (inflammation).     Immune Globulin 10% (GAMUNEX-C) 10 GM/100ML SOLN Inject into the vein. Every 3 weeks     levothyroxine (SYNTHROID) 50 MCG tablet Take 1 tablet (50 mcg total) by mouth daily before breakfast. 90 tablet 3   lidocaine-prilocaine (EMLA) cream Apply 1 application  topically as needed (access port).     ondansetron (ZOFRAN) 8 MG tablet Take by mouth every 8 (eight) hours as needed for nausea or vomiting.     rizatriptan (MAXALT-MLT) 10 MG disintegrating tablet Take 1 tablet (10 mg total) by mouth as needed for migraine. May repeat in 2 hours if needed 9 tablet 11   No current facility-administered medications on file prior to visit.    Review of Systems:  As per HPI- otherwise negative.   Physical Examination: There were no vitals filed for this visit. There were no vitals filed for this visit. There is no height or weight on file to calculate BMI. Ideal Body Weight:  Pt observed via mychart  video today Her apparent insect bite sites on her RIGHT ear, RIGHT hand and RIGHT arm appear better today compared with photographs from yesterday.  The patient also agrees they are improving Assessment and Plan: Insect bite, unspecified site, subsequent encounter  Virtual visit today to discuss presumed insect bites.  Patient had a vigorous reaction to insects which bit her when she was outside for several hours recently.  She was seen in the emergency room on 9/2 and start antibiotics, blood cultures are still negative She does seem to be improving, I offered to see her in person but for now she does not feel this is necessary She will let me know if she feels more than 7 days of antibiotics are needed I asked her to please see me if closely abreast of her progress  Signed Lamar Blinks, MD

## 2022-03-25 ENCOUNTER — Encounter: Payer: Self-pay | Admitting: Family Medicine

## 2022-03-25 ENCOUNTER — Telehealth (INDEPENDENT_AMBULATORY_CARE_PROVIDER_SITE_OTHER): Admitting: Family Medicine

## 2022-03-25 VITALS — Ht 63.5 in | Wt 166.0 lb

## 2022-03-25 DIAGNOSIS — W57XXXD Bitten or stung by nonvenomous insect and other nonvenomous arthropods, subsequent encounter: Secondary | ICD-10-CM | POA: Diagnosis not present

## 2022-03-25 DIAGNOSIS — S60561A Insect bite (nonvenomous) of right hand, initial encounter: Secondary | ICD-10-CM | POA: Diagnosis not present

## 2022-03-25 DIAGNOSIS — S50861A Insect bite (nonvenomous) of right forearm, initial encounter: Secondary | ICD-10-CM

## 2022-03-25 DIAGNOSIS — S00461A Insect bite (nonvenomous) of right ear, initial encounter: Secondary | ICD-10-CM

## 2022-03-26 ENCOUNTER — Encounter: Payer: Self-pay | Admitting: General Practice

## 2022-03-26 NOTE — Progress Notes (Signed)
[place] Note  Enya phoned to share health updates and to process her continued spiritual, emotional, and social steps in preparing for the end of her life. She is finding meditation increasingly helpful and is using a book called "Walking Each Other Home" by Robie Ridge and Gearldine Bienenstock to help guide her reflections. She is finalizing her arrangements for her Celebration of Life so that her wishes may be carried out fully.  Provided empathic listening, pastoral reflection, and spiritual/emotional support.  We plan to follow up, most likely by [place], ca next Friday, 9/15.   [place], [place], [facility] Pager [phone] Voicemail [phone]

## 2022-03-27 LAB — CULTURE, BLOOD (ROUTINE X 2)
Culture: NO GROWTH
Culture: NO GROWTH
Special Requests: ADEQUATE

## 2022-03-31 ENCOUNTER — Telehealth: Payer: Self-pay | Admitting: Family Medicine

## 2022-03-31 NOTE — Telephone Encounter (Signed)
Patient called to advise she has more bug bites and one was on the ear again which was not fully healed before she got bitten again. She said there was still some heat to the ear before this bite and it's beginning to ooze again. Offered to schedule appointment with another provider this week since Dr. Lorelei Pont is not available. Also offered appointment with her next week. Patient is on her way back to [place]. Patient uses [facility] on [address], [place].  Please call to advise.

## 2022-04-01 ENCOUNTER — Encounter: Payer: Self-pay | Admitting: Family Medicine

## 2022-04-01 MED ORDER — CEFDINIR 300 MG PO CAPS: 300.0000 mg | ORAL_CAPSULE | Freq: Two times a day (BID) | ORAL | 0 refills | Status: AC

## 2022-04-01 NOTE — Telephone Encounter (Signed)
Please see below.

## 2022-04-03 ENCOUNTER — Ambulatory Visit (INDEPENDENT_AMBULATORY_CARE_PROVIDER_SITE_OTHER): Payer: Medicare Other | Admitting: Family

## 2022-04-03 ENCOUNTER — Encounter: Payer: Self-pay | Admitting: General Practice

## 2022-04-03 ENCOUNTER — Encounter: Payer: Self-pay | Admitting: Family

## 2022-04-03 VITALS — BP 124/70 | HR 82 | Temp 98.4°F | Ht 63.0 in | Wt 170.0 lb

## 2022-04-03 DIAGNOSIS — C9110 Chronic lymphocytic leukemia of B-cell type not having achieved remission: Secondary | ICD-10-CM

## 2022-04-03 NOTE — Progress Notes (Signed)
[place] Spiritual Care Note  Followed up with Morgan [name] by Jackquline Denmark. She shared and processed about a recent memory-making trip to the beach with her daughter, as well as about the readings and meditations she is doing to help her make meaning through her hospice experience. We limited our call today because she is not feeling well. She plans to reach out to schedule another follow-up meeting.   [place], [place], [facility] Pager [phone] Voicemail [phone]

## 2022-04-03 NOTE — Progress Notes (Signed)
Morgan [name] is a 56 y.o. female with the following history as recorded in EpicCare:  Patient Active Problem List   Diagnosis Date Noted   Cellulitis 12/27/2021   Throat discomfort 03/12/2020   Hypothyroidism 11/18/2018   Fever 06/18/2018   Piriformis syndrome of right side 04/22/2017   Lateral epicondylitis of left elbow 04/06/2017   Solitary pulmonary nodule 10/29/2016   Chronic lymphocytic leukemia ([place]) 07/31/2016   Mass in neck 04/17/2016   Abdominal mass 04/17/2016   Asthma 12/12/2015   Multiple environmental allergies 12/12/2015   Chronic lower back pain 09/12/2015   Bipolar 1 disorder, depressed ([place]) 08/27/2015   Borderline personality disorder ([place]) 08/15/2015   Severe benzodiazepine use disorder ([place]) 08/15/2015   Alcohol use disorder, moderate, dependence ([place]) 08/15/2015   PTSD (post-traumatic stress disorder) 08/15/2015   History of bipolar disorder 08/15/2015   Pre-syncope 05/17/2015   Skull deformity 05/17/2015   Patellofemoral syndrome of both knees 05/02/2015    Current Outpatient Medications  Medication Sig Dispense Refill   acetaminophen (TYLENOL) 500 MG tablet Take 1,000 mg by mouth every 6 (six) hours as needed for mild pain.     albuterol (VENTOLIN HFA) 108 (90 Base) MCG/ACT inhaler Inhale 1-2 puffs into the lungs every 6 (six) hours as needed for wheezing or shortness of breath. 18 g 6   allopurinol (ZYLOPRIM) 300 MG tablet Take 300 mg by mouth daily.     aspirin-acetaminophen-caffeine (EXCEDRIN EXTRA STRENGTH) 250-250-65 MG tablet Take 2 tablets by mouth every 6 (six) hours as needed for headache.     cefdinir (OMNICEF) 300 MG capsule Take 1 capsule (300 mg total) by mouth every 12 (twelve) hours for 5 days. 10 capsule 0   diphenhydrAMINE (BENADRYL) 25 MG tablet Take 1 tablet (25 mg total) by mouth every 6 (six) hours as needed for itching. (Patient taking differently: Take 25 mg by mouth every 6 (six) hours as needed for itching. Uses as premed prior to  IVIG) 20 tablet 0   EPINEPHrine 0.3 mg/0.3 mL IJ SOAJ injection Inject 0.3 mLs (0.3 mg total) into the muscle as needed for anaphylaxis. 1 each prn   fexofenadine (ALLEGRA) 180 MG tablet Take 180 mg by mouth daily.     fluticasone furoate-vilanterol (BREO ELLIPTA) 100-25 MCG/ACT AEPB Inhale 1 puff into the lungs daily. 1 each 11   hydrOXYzine (ATARAX) 25 MG tablet Take 1 tablet (25 mg total) by mouth every 8 (eight) hours as needed for itching. 20 tablet 0   hyoscyamine (LEVSIN) 0.125 MG tablet Take 1 tablet (0.125 mg total) by mouth as needed for cramping. Reported on 09/24/2015 10 tablet 2   ibuprofen (ADVIL) 200 MG tablet Take 200 mg by mouth every 6 (six) hours as needed (inflammation).     levothyroxine (SYNTHROID) 50 MCG tablet Take 1 tablet (50 mcg total) by mouth daily before breakfast. 90 tablet 3   lidocaine-prilocaine (EMLA) cream Apply 1 application  topically as needed (access port).     ondansetron (ZOFRAN) 8 MG tablet Take by mouth every 8 (eight) hours as needed for nausea or vomiting.     rizatriptan (MAXALT-MLT) 10 MG disintegrating tablet Take 1 tablet (10 mg total) by mouth as needed for migraine. May repeat in 2 hours if needed 9 tablet 11   No current facility-administered medications for this visit.    Allergies: Aripiprazole, Ciprofloxacin, Lamotrigine, Nitrofurantoin, Other, Peanut oil, Amoxicillin, Doxycycline, Latex, Meloxicam, Naproxen, Pramipexole, Prednisone, Risperidone, Sulfa antibiotics, Tape, Cortisone, Keflex [cephalexin], Percocet [oxycodone-acetaminophen], Shingrix [zoster vac recomb  adjuvanted], Vancomycin, and Cuvitru [immune globulin (human)]  Past Medical History:  Diagnosis Date   Anxiety    Arthritis    Asthma    Bipolar disorder ([place])    Bulging lumbar disc 07/19/05   Chronic headaches    Chronic lymphocytic leukemia ([place]) 07/31/2016   Colon polyps    Degenerative disorder of bone    Depression    History of borderline personality disorder     Hypothyroidism 2007   Developed after use of Lithium   IBS (irritable bowel syndrome) 1995   Pneumonia    Proctitis    Shingles 07/2018   Substance abuse ([place])    ativan last month    Past Surgical History:  Procedure Laterality Date   ABDOMINAL HYSTERECTOMY     APPENDECTOMY     BUNIONECTOMY Right 06/2012   CHOLECYSTECTOMY     OOPHORECTOMY     SHOULDER OPEN ROTATOR CUFF REPAIR Right 03/2010   TUBAL LIGATION      Family History  Problem Relation Age of Onset   Depression Mother    Hypertension Mother    Thyroid disease Father    Alzheimer's disease Father    Post-traumatic stress disorder Sister    Alcohol abuse Brother    Drug abuse Brother    Post-traumatic stress disorder Brother    COPD Maternal Grandmother    Heart disease Maternal Grandfather    Arthritis Paternal Grandmother    Diabetes Paternal Grandmother    Depression Paternal Grandmother    Alzheimer's disease Paternal Grandmother    Heart disease Paternal Grandfather    Coronary artery disease Paternal Grandfather    Alcohol abuse Paternal Grandfather    Sleep apnea Cousin    Diabetes Maternal Uncle    Colon cancer Neg Hx    Breast cancer Neg Hx     Social History   Tobacco Use   Smoking status: Former    Types: Cigarettes    Quit date: 07/20/1980    Years since quitting: 41.7   Smokeless tobacco: Never   Tobacco comments:    Also exposed to second hand smoke as a child  Substance Use Topics   Alcohol use: Not Currently    Comment: occ    Subjective:  Being treated for possible cellulitis secondary to insect bites; her PCP started her Cefdinir on Wednesday; asked for areas of concern to be evaluated in person; patient does have CLL- is now under hospice care; patient is concerned about petechiae developing at base of right ankle; large lymph node concerning behind right ear;      Objective:  Vitals:   04/03/22 1441  BP: 124/70  Pulse: 82  Temp: 98.4 F (36.9 C)  TempSrc: Oral  SpO2: 99%   Weight: 170 lb (77.1 kg)  Height: '5\' 3"'$  (1.6 m)    General: Well developed, well nourished, in no acute distress  Skin : Warm and dry. Large lymph node noted behind right ear; erythematous lesions scattered on lower extremities- mild area of petechiae noted beneath right ankle;  Head: Normocephalic and atraumatic  Lungs: Respirations unlabored;  Neurologic: Alert and oriented; speech intact; face symmetrical; moves all extremities well; CNII-XII intact without focal deficit   Assessment:  1. Chronic lymphocytic leukemia ([place])     Plan:  Suspect skin lesions of concern are related to her leukemia and not cellulitis; complete Omnicef as prescribed; will check CBC to ensure that platelets are normal- patient's WBC is consistently above 200- aware this is not emergent. Will  reach out to PCP to ensure that continuity of care is preserved. Patient is under care of hospice and oncology care is through [facility].   No follow-ups on file.  Orders Placed This Encounter  Procedures   CBC with Differential/Platelet    Requested Prescriptions    No prescriptions requested or ordered in this encounter

## 2022-04-06 ENCOUNTER — Telehealth: Payer: Self-pay

## 2022-04-06 LAB — CBC WITH DIFFERENTIAL/PLATELET
Absolute Monocytes: 17911 cells/uL — ABNORMAL HIGH (ref 200–950)
Basophils Absolute: 0 cells/uL (ref 0–200)
Basophils Relative: 0 %
Eosinophils Absolute: 853 cells/uL — ABNORMAL HIGH (ref 15–500)
Eosinophils Relative: 0.3 %
HCT: 30.5 % — ABNORMAL LOW (ref 35.0–45.0)
Hemoglobin: 8.5 g/dL — ABNORMAL LOW (ref 11.7–15.5)
Lymphs Abs: 261272 cells/uL — ABNORMAL HIGH (ref 850–3900)
MCH: 26.6 pg — ABNORMAL LOW (ref 27.0–33.0)
MCHC: 27.9 g/dL — ABNORMAL LOW (ref 32.0–36.0)
MCV: 95.3 fL (ref 80.0–100.0)
MPV: 9.2 fL (ref 7.5–12.5)
Monocytes Relative: 6.3 %
Neutro Abs: 4265 cells/uL (ref 1500–7800)
Neutrophils Relative %: 1.5 %
Platelets: 122 10*3/uL — ABNORMAL LOW (ref 140–400)
RBC: 3.2 10*6/uL — ABNORMAL LOW (ref 3.80–5.10)
RDW: 18.3 % — ABNORMAL HIGH (ref 11.0–15.0)
Total Lymphocyte: 91.9 %
WBC: 284.3 10*3/uL — ABNORMAL HIGH (ref 3.8–10.8)

## 2022-04-06 NOTE — Telephone Encounter (Signed)
Nurse Assessment Nurse: Laurann Montana, RN, [place]ow Date/Time Eilene Ghazi Time): 04/04/2022 11:52:31 AM Is there an on-call provider listed? ---Yes Please list name of person reporting value (Lab Employee) and a contact number. ---Hassan Rowan @ [phone] Please document the following items: Lab name Lab value (read back to lab to verify) Reference range for lab value Date and time blood was drawn Collect time of birth for bilirubin results ---WBC 284.3 (3.8-10.8) 04/03/2022 @ 3:25pm Verified by repeat analysis Please collect the patient contact information from the lab. (name, phone number and address) ---Sharri App [phone] Disp. Time Eilene Ghazi Time) Disposition Final User 04/04/2022 11:57:19 AM Paged On Call back to [facility], [place], Fransico Meadow 04/04/2022 11:57:49 AM Lab Call Laurann Montana, [place], [place] Meadow Reason: RN spoke with Hassan Rowan @ [phone] to obtain critical lab value. 04/04/2022 12:11:13 PM Paged On Call back to [facility], RN, Fransico Meadow 04/04/2022 12:19:24 PM Attempt made - message left Vira Agar 04/04/2022 12:36:35 PM Attempt made - message left Vira Agar 04/04/2022 12:50:45 PM Paged On Call back to [facility], [place], Fransico Meadow 04/04/2022 12:54:34 PM Clinical Call Yes Laurann Montana, [place], Fransico Meadow Final Disposition 04/04/2022 12:54:34 PM Clinical Call Yes Laurann Montana, RN, [place] NOTE: All timestamps contained within this report are represented as [place] Standard Time. CONFIDENTIALTY NOTICE: This fax transmission is intended only for the addressee. It contains information that is legally privileged, confidential or otherwise protected from use or disclosure. If you are not the intended recipient, you are strictly prohibited from reviewing, disclosing, copying using or disseminating any of this information or taking any action in reliance on or regarding this information. If you have received this fax in  error, please notify us immediately by telephone so that we can arrange for its return to [place]. Phone: [phone], Toll-Free: [phone], Fax: [phone] Page: 2 of 2 Call Id: [id number] Paging DoctorName Phone DateTime Result/ Outcome Message Type Notes Alysia Penna - MD [phone] 04/04/2022 11:57:19 AM Paged On Call Back to Call Center Doctor Paged Please contact [place] @ [phone]. Thank you. Alysia Penna - MD 04/04/2022 12:18:33 PM Spoke with On Call - General Message Result On call MD states that patient needs to be evaluated dt high WBC. Patient can go to the med [place]. No further orders. Alysia Penna - MD [phone] 04/04/2022 12:50:45 PM Paged On Call Back to Call Center Doctor Paged Please contact [place] @ [phone]. Thank you. Alysia Penna - MD 04/04/2022 12:54:26 PM Spoke with On Call - General Message Result RN notified on call MD that patient could not be contacted. No further orders

## 2022-04-07 NOTE — Telephone Encounter (Signed)
[place], [place]  04/06/2022  4:50 PM EDT Back to Top    Pt stated understanding with the lab results and she stated that she has decided not to do treatment at this time.   Marrian Salvage, [place]  04/06/2022  3:08 PM EDT     Please let her know that her hemoglobin was down to 8.5 and platelets were at 122; she should reach out to her oncologist about follow up. WBC was up to 284; [facility] should be able to see her labs as well.

## 2022-04-10 ENCOUNTER — Encounter: Payer: Self-pay | Admitting: General Practice

## 2022-04-10 NOTE — Progress Notes (Signed)
[place] [facility] Spiritual Care Note  Phoned Morgan [name] to notify her of a program cancellation due to inclement weather and had a pastoral check-in. She is investigating assisted living options with her daughter and has plans for a visit from her son and his fiancee in [place] in the coming weeks. Morgan [name] is very committed to the [employer]'s annual fundraising event and walk, Light the Night, for which she has been collecting donations and gathering people to participate.  Provided empathic listening and emotional/spiritual support. Morgan [name] plans to reach out again as needed.   [place], [place], [facility] Pager [phone] Voicemail [phone]

## 2022-04-13 ENCOUNTER — Encounter: Payer: Self-pay | Admitting: General Practice

## 2022-04-13 NOTE — Progress Notes (Signed)
[place] Spiritual Care Note  Spoke with Morgan [name] by phone per her request for listening and support related to her spiritual outlook. She is navigating tension between faithfulness and willfulness as she continues to work on tasks like revising her celebration of life service plans and looking for an affordable assisted living situation to bridge the potential gap between independent living and [employer] (hospice home) eligibility.  Provided pastoral presence, empathic listening, and spiritual/emotional support as she processed her feelings, hopes, and needs.  Morgan [name] plans to reach out again as needed.   [place], [place], [facility] Pager [phone] Voicemail [phone]

## 2022-04-17 ENCOUNTER — Encounter: Payer: Self-pay | Admitting: General Practice

## 2022-04-17 NOTE — Progress Notes (Signed)
[place] Note  Merri phoned to share an update about her her health decline (increased fatigue, headaches, abdominal pain/discomfort especially at bedtime) and to process her two upcoming goals, the [employer] "Light the Night" fundraising walk on 10/7 and the visit from her son and his fiancee for his birthday the following weekend.  Provided empathic listening, emotional support. Alanya plans to phone again as needed.   [place], [place], [facility] Pager [phone] Voicemail [phone]

## 2022-04-22 ENCOUNTER — Ambulatory Visit: Payer: Medicare Other

## 2022-04-24 ENCOUNTER — Ambulatory Visit (INDEPENDENT_AMBULATORY_CARE_PROVIDER_SITE_OTHER): Payer: Medicare Other | Admitting: *Deleted

## 2022-04-24 DIAGNOSIS — Z0000 Encounter for general adult medical examination without abnormal findings: Secondary | ICD-10-CM

## 2022-04-24 NOTE — Progress Notes (Signed)
Subjective:   Morgan [name] is a 56 y.o. female who presents for Medicare Annual (Subsequent) preventive examination.  I connected with  Morgan [name] on 04/24/22 by a audio enabled telemedicine application and verified that I am speaking with the correct person using two identifiers.  Patient Location: Home  Provider Location: Office/Clinic  I discussed the limitations of evaluation and management by telemedicine. The patient expressed understanding and agreed to proceed.   Review of Systems    Defer to PCP Cardiac Risk Factors include: none     Objective:    There were no vitals filed for this visit. There is no height or weight on file to calculate BMI.     04/24/2022    1:52 PM 03/21/2022    7:10 PM 12/27/2021    9:22 PM 12/27/2021    3:03 PM 05/25/2021    5:09 PM 04/17/2021    9:40 AM 12/24/2020    2:13 AM  Advanced Directives  Does Patient Have a Medical Advance Directive? Yes No No No Yes Yes Yes  Type of Paramedic of [place];Living will     [place];Living will [place];Living will;Out of facility DNR (pink MOST or yellow form)  Does patient want to make changes to medical advance directive? No - Patient declined      Yes (ED - Information included in AVS)  Copy of [place] in Chart? No - copy requested     Yes - validated most recent copy scanned in chart (See row information)   Would patient like information on creating a medical advance directive?   No - Patient declined        Current Medications (verified) Outpatient Encounter Medications as of 04/24/2022  Medication Sig   acetaminophen (TYLENOL) 500 MG tablet Take 1,000 mg by mouth every 6 (six) hours as needed for mild pain.   albuterol (VENTOLIN HFA) 108 (90 Base) MCG/ACT inhaler Inhale 1-2 puffs into the lungs every 6 (six) hours as needed for wheezing or shortness of breath.   allopurinol (ZYLOPRIM) 300 MG tablet Take 300 mg  by mouth daily.   aspirin-acetaminophen-caffeine (EXCEDRIN EXTRA STRENGTH) 250-250-65 MG tablet Take 2 tablets by mouth every 6 (six) hours as needed for headache.   diphenhydrAMINE (BENADRYL) 25 MG tablet Take 1 tablet (25 mg total) by mouth every 6 (six) hours as needed for itching. (Patient taking differently: Take 25 mg by mouth every 6 (six) hours as needed for itching. Uses as premed prior to IVIG)   EPINEPHrine 0.3 mg/0.3 mL IJ SOAJ injection Inject 0.3 mLs (0.3 mg total) into the muscle as needed for anaphylaxis.   fexofenadine (ALLEGRA) 180 MG tablet Take 180 mg by mouth daily.   fluticasone furoate-vilanterol (BREO ELLIPTA) 100-25 MCG/ACT AEPB Inhale 1 puff into the lungs daily.   hydrOXYzine (ATARAX) 25 MG tablet Take 1 tablet (25 mg total) by mouth every 8 (eight) hours as needed for itching.   hyoscyamine (LEVSIN) 0.125 MG tablet Take 1 tablet (0.125 mg total) by mouth as needed for cramping. Reported on 09/24/2015   ibuprofen (ADVIL) 200 MG tablet Take 200 mg by mouth every 6 (six) hours as needed (inflammation).   levothyroxine (SYNTHROID) 50 MCG tablet Take 1 tablet (50 mcg total) by mouth daily before breakfast.   lidocaine-prilocaine (EMLA) cream Apply 1 application  topically as needed (access port).   ondansetron (ZOFRAN) 8 MG tablet Take by mouth every 8 (eight) hours as needed for  nausea or vomiting.   rizatriptan (MAXALT-MLT) 10 MG disintegrating tablet Take 1 tablet (10 mg total) by mouth as needed for migraine. May repeat in 2 hours if needed   No facility-administered encounter medications on file as of 04/24/2022.    Allergies (verified) Aripiprazole, Ciprofloxacin, Lamotrigine, Nitrofurantoin, Other, Peanut oil, Amoxicillin, Doxycycline, Latex, Meloxicam, Naproxen, Pramipexole, Prednisone, Risperidone, Sulfa antibiotics, Tape, Cortisone, Keflex [cephalexin], Percocet [oxycodone-acetaminophen], Shingrix [zoster vac recomb adjuvanted], Vancomycin, and Cuvitru [immune globulin  (human)]   History: Past Medical History:  Diagnosis Date   Anxiety    Arthritis    Asthma    Bipolar disorder ([place])    Bulging lumbar disc 07/19/05   Chronic headaches    Chronic lymphocytic leukemia ([place]) 07/31/2016   Colon polyps    Degenerative disorder of bone    Depression    History of borderline personality disorder    Hypothyroidism 2007   Developed after use of Lithium   IBS (irritable bowel syndrome) 1995   Pneumonia    Proctitis    Shingles 07/2018   Substance abuse ([place])    ativan last month   Past Surgical History:  Procedure Laterality Date   ABDOMINAL HYSTERECTOMY     APPENDECTOMY     BUNIONECTOMY Right 06/2012   CHOLECYSTECTOMY     OOPHORECTOMY     SHOULDER OPEN ROTATOR CUFF REPAIR Right 03/2010   TUBAL LIGATION     Family History  Problem Relation Age of Onset   Depression Mother    Hypertension Mother    Thyroid disease Father    Alzheimer's disease Father    Post-traumatic stress disorder Sister    Alcohol abuse Brother    Drug abuse Brother    Post-traumatic stress disorder Brother    COPD Maternal Grandmother    Heart disease Maternal Grandfather    Arthritis Paternal Grandmother    Diabetes Paternal Grandmother    Depression Paternal Grandmother    Alzheimer's disease Paternal Grandmother    Heart disease Paternal Grandfather    Coronary artery disease Paternal Grandfather    Alcohol abuse Paternal Grandfather    Sleep apnea Cousin    Diabetes Maternal Uncle    Colon cancer Neg Hx    Breast cancer Neg Hx    Social History   Socioeconomic History   Marital status: Divorced    Spouse name: Not on file   Number of children: 2   Years of education: 45   Highest education level: Not on file  Occupational History    Comment: [employer]  Tobacco Use   Smoking status: Former    Types: Cigarettes    Quit date: 07/20/1980    Years since quitting: 41.7   Smokeless tobacco: Never   Tobacco comments:    Also exposed to second hand smoke as a  child  Vaping Use   Vaping Use: Never used  Substance and Sexual Activity   Alcohol use: Not Currently    Comment: occ   Drug use: Not Currently   Sexual activity: Not Currently    Birth control/protection: Surgical    Comment: intercourse age unknown,sexual partners less than 5  Other Topics Concern   Not on file  Social History Narrative   Fun: [place], travel, music, writing, walking, hiking, volunteering   Denies religious beliefs effecting health care.    Feels safe at home.    Divorced,   Children 2.  Lives home alone.   Right handed dominant    Caffeine: 2 cups/day   Social  Determinants of Health   Financial Resource Strain: Low Risk  (08/21/2021)   Overall Financial Resource Strain (CARDIA)    Difficulty of Paying Living Expenses: Not very hard  Food Insecurity: Food Insecurity Present (04/17/2021)   Hunger Vital Sign    Worried About Running Out of Food in the Last Year: Sometimes true    Ran Out of Food in the Last Year: Never true  Transportation Needs: No Transportation Needs (11/08/2020)   PRAPARE - Hydrologist (Medical): No    Lack of Transportation (Non-Medical): No  Physical Activity: Insufficiently Active (02/18/2022)   Exercise Vital Sign    Days of Exercise per Week: 3 days    Minutes of Exercise per Session: 30 min  Stress: Stress Concern Present (02/17/2021)   [place]    Feeling of Stress : To some extent  Social Connections: Moderately Isolated (04/17/2021)   Social Connection and Isolation Panel [NHANES]    Frequency of Communication with Friends and Family: Once a week    Frequency of Social Gatherings with Friends and Family: Once a week    Attends Religious Services: 1 to 4 times per year    Active Member of [employer] or Organizations: Yes    Attends Archivist Meetings: 1 to 4 times per year    Marital Status: Divorced    Tobacco  Counseling Counseling given: Not Answered Tobacco comments: Also exposed to second hand smoke as a child   Clinical Intake:  Pre-visit preparation completed: Yes  Pain : No/denies pain  How often do you need to have someone help you when you read instructions, pamphlets, or other written materials from your doctor or pharmacy?: 1 - Never  Diabetic? No  Activities of Daily Living    04/24/2022    1:46 PM 12/27/2021    9:22 PM  In your present state of health, do you have any difficulty performing the following activities:  Hearing? 0 0  Vision? 0 0  Difficulty concentrating or making decisions? 1 0  Walking or climbing stairs? 1 0  Dressing or bathing? 0 0  Doing errands, shopping? 1 0  Preparing Food and eating ? Y   Using the Toilet? N   In the past six months, have you accidently leaked urine? Y   Do you have problems with loss of bowel control? Y   Managing your Medications? N   Managing your Finances? N   Housekeeping or managing your Housekeeping? Y     Patient Care Team: Copland, Gay Filler, MD as PCP - General (Family Medicine) Binnie Rail, MD (Internal Medicine) Lanette Hampshire, MD as Referring Physician (Internal Medicine) Jeanann Lewandowsky, MD as Referring Physician (Oncology) Mcarthur Rossetti, MD as Consulting Physician (Orthopedic Surgery) Rockne Coons, MD (Infectious Diseases)  Indicate any recent Medical Services you may have received from other than [facility] providers in the past year (date may be approximate).     Assessment:   This is a routine wellness examination for [place].  Hearing/Vision screen No results found.  Dietary issues and exercise activities discussed: Current Exercise Habits: The patient does not participate in regular exercise at present, Exercise limited by: Other - see comments (terminal cancer)   Goals Addressed   None    Depression Screen    04/24/2022    1:53 PM 04/03/2022    2:43 PM 03/25/2022    10:43 AM 04/17/2021    9:43 AM 01/02/2021  3:04 PM 11/20/2020    1:06 PM 05/08/2020   10:57 AM  PHQ 2/9 Scores  PHQ - 2 Score 2 0 0 1 0 0 1  PHQ- 9 Score       9    Fall Risk    04/24/2022    1:53 PM 04/03/2022    2:43 PM 03/25/2022   10:43 AM 01/01/2022    1:58 PM 04/17/2021    9:41 AM  Fall Risk   Falls in the past year? 0 0 0 0 0  Number falls in past yr: 0 0 0  0  Injury with Fall? 0 0 0  0  Risk for fall due to : No Fall Risks No Fall Risks;Impaired balance/gait No Fall Risks No Fall Risks   Follow up Falls evaluation completed Falls evaluation completed Falls evaluation completed Falls evaluation completed Falls prevention discussed    FALL RISK PREVENTION PERTAINING TO THE HOME:  Any stairs in or around the home? Yes  If so, are there any without handrails? No  Home free of loose throw rugs in walkways, pet beds, electrical cords, etc? Yes  Adequate lighting in your home to reduce risk of falls? Yes   ASSISTIVE DEVICES UTILIZED TO PREVENT FALLS:  Life alert? No  Use of a cane, walker or w/c? Yes  Grab bars in the bathroom? No  Shower chair or bench in shower? Yes  Elevated toilet seat or a handicapped toilet? No   TIMED UP AND GO:  Was the test performed? No . Audio visit  Cognitive Function:        04/24/2022    1:56 PM 04/17/2021    9:58 AM  6CIT Screen  What Year? 0 points 0 points  What month? 0 points 0 points  What time? 0 points 0 points  Count back from 20 0 points 0 points  Months in reverse 0 points 0 points  Repeat phrase 0 points 0 points  Total Score 0 points 0 points    Immunizations Immunization History  Administered Date(s) Administered   Influenza, Quadrivalent, Recombinant, Inj, Pf 04/28/2018   Influenza,inj,Quad PF,6+ Mos 03/20/2019, 06/03/2021   Influenza-Unspecified 04/15/2015, 05/19/2017   Moderna Sars-Covid-2 Vaccination 10/03/2019, 11/03/2019, 06/08/2020   Tdap 04/15/2015   Zoster Recombinat (Shingrix) 02/23/2019    TDAP  status: Up to date  Flu Vaccine status: Due, Education has been provided regarding the importance of this vaccine. Advised may receive this vaccine at local pharmacy or Health Dept. Aware to provide a copy of the vaccination record if obtained from local pharmacy or Health Dept. Verbalized acceptance and understanding.  Covid-19 vaccine status: Information provided on how to obtain vaccines.   Qualifies for Shingles Vaccine? Yes   Zostavax completed No   Shingrix Completed?: No.    Education has been provided regarding the importance of this vaccine. Patient has been advised to call insurance company to determine out of pocket expense if they have not yet received this vaccine. Advised may also receive vaccine at local pharmacy or Health Dept. Verbalized acceptance and understanding.  Screening Tests Health Maintenance  Topic Date Due   Zoster Vaccines- Shingrix (2 of 2) 04/20/2019   COVID-19 Vaccine (4 - Moderna risk series) 08/03/2020   INFLUENZA VACCINE  02/17/2022   MAMMOGRAM  03/06/2023   PAP SMEAR-Modifier  03/17/2024   TETANUS/TDAP  04/14/2025   COLONOSCOPY (Pts 45-98yr Insurance coverage will need to be confirmed)  09/23/2025   Hepatitis C Screening  Completed   HIV Screening  Completed   HPV VACCINES  Aged Out    Health Maintenance  Health Maintenance Due  Topic Date Due   Zoster Vaccines- Shingrix (2 of 2) 04/20/2019   COVID-19 Vaccine (4 - Moderna risk series) 08/03/2020   INFLUENZA VACCINE  02/17/2022    Colorectal cancer screening: Type of screening: Colonoscopy. Completed 09/24/15. Repeat every 10 years  Mammogram status: Completed 03/05/21. Repeat every year  Lung Cancer Screening: (Low Dose CT Chest recommended if Age 9-80 years, 30 pack-year currently smoking OR have quit w/in 15years.) does not qualify.   Lung Cancer Screening Referral: N/a  Additional Screening:  Hepatitis C Screening: does qualify; Completed 02/25/18  Vision Screening: Recommended annual  ophthalmology exams for early detection of glaucoma and other disorders of the eye. Is the patient up to date with their annual eye exam?  Yes  Who is the provider or what is the name of the office in which the patient attends annual eye exams? Dr. Sharen Counter If pt is not established with a provider, would they like to be referred to a provider to establish care? No .   Dental Screening: Recommended annual dental exams for proper oral hygiene  Community Resource Referral / Chronic Care Management: CRR required this visit?  No   CCM required this visit?  No      Plan:     I have personally reviewed and noted the following in the patient's chart:   Medical and social history Use of alcohol, tobacco or illicit drugs  Current medications and supplements including opioid prescriptions. Patient is not currently taking opioid prescriptions. Functional ability and status Nutritional status Physical activity Advanced directives List of other physicians Hospitalizations, surgeries, and ER visits in previous 12 months Vitals Screenings to include cognitive, depression, and falls Referrals and appointments  In addition, I have reviewed and discussed with patient certain preventive protocols, quality metrics, and best practice recommendations. A written personalized care plan for preventive services as well as general preventive health recommendations were provided to patient.   Due to this being a telephonic visit, the after visit summary with patients personalized plan was offered to patient via mail or my-chart. Patient would like to access on my-chart.  Beatris Ship, [place]   04/24/2022   Nurse Notes: None

## 2022-04-24 NOTE — Patient Instructions (Signed)
Morgan [name] , Thank you for taking time to come for your Medicare Wellness Visit. I appreciate your ongoing commitment to your health goals. Please review the following plan we discussed and let me know if I can assist you in the future.   These are the goals we discussed:  Goals      Patient Stated     Lose some weight        This is a list of the screening recommended for you and due dates:  Health Maintenance  Topic Date Due   Zoster (Shingles) Vaccine (2 of 2) 04/20/2019   COVID-19 Vaccine (4 - Moderna risk series) 08/03/2020   Flu Shot  02/17/2022   Mammogram  03/06/2023   Pap Smear  03/17/2024   Tetanus Vaccine  04/14/2025   Colon Cancer Screening  09/23/2025   Hepatitis C Screening: USPSTF Recommendation to screen - Ages 18-79 yo.  Completed   HIV Screening  Completed   HPV Vaccine  Aged Out      Next appointment: Follow up in one year for your annual wellness visit.   Preventive Care 40-64 Years, Female Preventive care refers to lifestyle choices and visits with your health care provider that can promote health and wellness. What does preventive care include? A yearly physical exam. This is also called an annual well check. Dental exams once or twice a year. Routine eye exams. Ask your health care provider how often you should have your eyes checked. Personal lifestyle choices, including: Daily care of your teeth and gums. Regular physical activity. Eating a healthy diet. Avoiding tobacco and drug use. Limiting alcohol use. Practicing safe sex. Taking low-dose aspirin daily starting at age 51. Taking vitamin and mineral supplements as recommended by your health care provider. What happens during an annual well check? The services and screenings done by your health care provider during your annual well check will depend on your age, overall health, lifestyle risk factors, and family history of disease. Counseling  Your health care provider may ask you questions  about your: Alcohol use. Tobacco use. Drug use. Emotional well-being. Home and relationship well-being. Sexual activity. Eating habits. Work and work Statistician. Method of birth control. Menstrual cycle. Pregnancy history. Screening  You may have the following tests or measurements: Height, weight, and BMI. Blood pressure. Lipid and cholesterol levels. These may be checked every 5 years, or more frequently if you are over 80 years old. Skin check. Lung cancer screening. You may have this screening every year starting at age 63 if you have a 30-pack-year history of smoking and currently smoke or have quit within the past 15 years. Fecal occult blood test (FOBT) of the stool. You may have this test every year starting at age 68. Flexible sigmoidoscopy or colonoscopy. You may have a sigmoidoscopy every 5 years or a colonoscopy every 10 years starting at age 62. Hepatitis C blood test. Hepatitis B blood test. Sexually transmitted disease (STD) testing. Diabetes screening. This is done by checking your blood sugar (glucose) after you have not eaten for a while (fasting). You may have this done every 1-3 years. Mammogram. This may be done every 1-2 years. Talk to your health care provider about when you should start having regular mammograms. This may depend on whether you have a family history of breast cancer. BRCA-related cancer screening. This may be done if you have a family history of breast, ovarian, tubal, or peritoneal cancers. Pelvic exam and Pap test. This may be done every  3 years starting at age 88. Starting at age 67, this may be done every 5 years if you have a Pap test in combination with an HPV test. Bone density scan. This is done to screen for osteoporosis. You may have this scan if you are at high risk for osteoporosis. Discuss your test results, treatment options, and if necessary, the need for more tests with your health care provider. Vaccines  Your health care  provider may recommend certain vaccines, such as: Influenza vaccine. This is recommended every year. Tetanus, diphtheria, and acellular pertussis (Tdap, Td) vaccine. You may need a Td booster every 10 years. Zoster vaccine. You may need this after age 63. Pneumococcal 13-valent conjugate (PCV13) vaccine. You may need this if you have certain conditions and were not previously vaccinated. Pneumococcal polysaccharide (PPSV23) vaccine. You may need one or two doses if you smoke cigarettes or if you have certain conditions. Talk to your health care provider about which screenings and vaccines you need and how often you need them. This information is not intended to replace advice given to you by your health care provider. Make sure you discuss any questions you have with your health care provider. Document Released: 08/02/2015 Document Revised: 03/25/2016 Document Reviewed: 05/07/2015 Elsevier Interactive Patient Education  2017 [employer] Prevention in the Home Falls can cause injuries. They can happen to people of all ages. There are many things you can do to make your home safe and to help prevent falls. What can I do on the outside of my home? Regularly fix the edges of walkways and driveways and fix any cracks. Remove anything that might make you trip as you walk through a door, such as a raised step or threshold. Trim any bushes or trees on the path to your home. Use bright outdoor lighting. Clear any walking paths of anything that might make someone trip, such as rocks or tools. Regularly check to see if handrails are loose or broken. Make sure that both sides of any steps have handrails. Any raised decks and porches should have guardrails on the edges. Have any leaves, snow, or ice cleared regularly. Use sand or salt on walking paths during winter. Clean up any spills in your garage right away. This includes oil or grease spills. What can I do in the bathroom? Use night  lights. Install grab bars by the toilet and in the tub and shower. Do not use towel bars as grab bars. Use non-skid mats or decals in the tub or shower. If you need to sit down in the shower, use a plastic, non-slip stool. Keep the floor dry. Clean up any water that spills on the floor as soon as it happens. Remove soap buildup in the tub or shower regularly. Attach bath mats securely with double-sided non-slip rug tape. Do not have throw rugs and other things on the floor that can make you trip. What can I do in the bedroom? Use night lights. Make sure that you have a light by your bed that is easy to reach. Do not use any sheets or blankets that are too big for your bed. They should not hang down onto the floor. Have a firm chair that has side arms. You can use this for support while you get dressed. Do not have throw rugs and other things on the floor that can make you trip. What can I do in the kitchen? Clean up any spills right away. Avoid walking on wet  floors. Keep items that you use a lot in easy-to-reach places. If you need to reach something above you, use a strong step stool that has a grab bar. Keep electrical cords out of the way. Do not use floor polish or wax that makes floors slippery. If you must use wax, use non-skid floor wax. Do not have throw rugs and other things on the floor that can make you trip. What can I do with my stairs? Do not leave any items on the stairs. Make sure that there are handrails on both sides of the stairs and use them. Fix handrails that are broken or loose. Make sure that handrails are as long as the stairways. Check any carpeting to make sure that it is firmly attached to the stairs. Fix any carpet that is loose or worn. Avoid having throw rugs at the top or bottom of the stairs. If you do have throw rugs, attach them to the floor with carpet tape. Make sure that you have a light switch at the top of the stairs and the bottom of the stairs. If  you do not have them, ask someone to add them for you. What else can I do to help prevent falls? Wear shoes that: Do not have high heels. Have rubber bottoms. Are comfortable and fit you well. Are closed at the toe. Do not wear sandals. If you use a stepladder: Make sure that it is fully opened. Do not climb a closed stepladder. Make sure that both sides of the stepladder are locked into place. Ask someone to hold it for you, if possible. Clearly mark and make sure that you can see: Any grab bars or handrails. First and last steps. Where the edge of each step is. Use tools that help you move around (mobility aids) if they are needed. These include: Canes. Walkers. Scooters. Crutches. Turn on the lights when you go into a dark area. Replace any light bulbs as soon as they burn out. Set up your furniture so you have a clear path. Avoid moving your furniture around. If any of your floors are uneven, fix them. If there are any pets around you, be aware of where they are. Review your medicines with your doctor. Some medicines can make you feel dizzy. This can increase your chance of falling. Ask your doctor what other things that you can do to help prevent falls. This information is not intended to replace advice given to you by your health care provider. Make sure you discuss any questions you have with your health care provider. Document Released: 05/02/2009 Document Revised: 12/12/2015 Document Reviewed: 08/10/2014 Elsevier Interactive Patient Education  2017 [employer].

## 2022-05-01 ENCOUNTER — Encounter: Payer: Self-pay | Admitting: General Practice

## 2022-05-01 NOTE — Progress Notes (Signed)
[place] Spiritual Care Note  Met with Morgan [name] via Webex for Spiritual Care Virtual Visit. She is continuing to practice meditation and other coping tools as she notices her health and energy decline. One big area of stress and potential right now is her exploring the possibility of renting a different place, one that would lack stairs and would have enough space for family to stay with her while visiting.  Provided pastoral presence, reflective listening, pastoral reflection, and emotional support.  We have a follow-up virtual visit scheduled for Friday, October 20.   [place], [place], [facility] Pager [phone] Voicemail [phone]

## 2022-05-08 ENCOUNTER — Encounter: Payer: Self-pay | Admitting: General Practice

## 2022-05-08 NOTE — Progress Notes (Signed)
[facility] Spiritual Care Note  Had Spiritual Care Virtual Visit for one hour as planned. Morgan [name] is processing relationship dynamics and spiritual needs as she moves toward the end of her life, particularly as she notices her energy decrease and her attention turn inward.  Provided empathic listening, normalization of feelings, and emotional/spiritual support.  We have scheduled a follow-up Virtual Visit for 05/15/2022 at 1:00.   [place], [place], [facility] Pager [phone] Voicemail [phone]

## 2022-05-10 ENCOUNTER — Encounter: Payer: Self-pay | Admitting: Family Medicine

## 2022-05-12 ENCOUNTER — Encounter: Payer: Self-pay | Admitting: General Practice

## 2022-05-12 NOTE — Progress Notes (Signed)
[place] Note  Merlin phoned for spiritual and emotional support, especially normalization of experience of declining physical and emotional/social energy toward the end of life.  Provided empathic listening, emotional support, and normalization of feelings.  Kimberleigh plans to reach out again as needed.   [place], [place], [facility] Pager [phone] Voicemail [phone]

## 2022-05-15 ENCOUNTER — Encounter: Payer: Self-pay | Admitting: General Practice

## 2022-05-15 NOTE — Progress Notes (Signed)
[place] Spiritual Care Note  Had Spiritual Care Virtual Visit via Webex as scheduled.  Envy reports a very difficult night with pain significant enough to call [facility] for assistance.  Helped Zian to brainstorm about additional tools for coping with pain and to prepare questions regarding possible pain-management regimens for her hospice team.  We plan to follow up on Thursday, November 2 at 1:00 via Webex.  [place], [place], [facility] Pager [phone] Voicemail [phone]

## 2022-05-26 DIAGNOSIS — J45909 Unspecified asthma, uncomplicated: Secondary | ICD-10-CM | POA: Diagnosis not present

## 2022-05-26 DIAGNOSIS — Z87891 Personal history of nicotine dependence: Secondary | ICD-10-CM | POA: Diagnosis not present

## 2022-05-26 DIAGNOSIS — K589 Irritable bowel syndrome without diarrhea: Secondary | ICD-10-CM | POA: Diagnosis not present

## 2022-05-26 DIAGNOSIS — I493 Ventricular premature depolarization: Secondary | ICD-10-CM | POA: Diagnosis not present

## 2022-05-26 DIAGNOSIS — D801 Nonfamilial hypogammaglobulinemia: Secondary | ICD-10-CM | POA: Diagnosis not present

## 2022-05-26 DIAGNOSIS — C9110 Chronic lymphocytic leukemia of B-cell type not having achieved remission: Secondary | ICD-10-CM | POA: Diagnosis not present

## 2022-06-15 ENCOUNTER — Encounter: Payer: Self-pay | Admitting: General Practice

## 2022-06-15 NOTE — Progress Notes (Signed)
[facility] Spiritual Care Note  Had Spiritual Care Virtual Visit via WebEx with Morgan [name]. She is working to process her continued decrease in energy and the associated changes in priority and activity level, which can be a source of frustration and grief because Morgan [name] is so accustomed to an independent, active lifestyle and also has very limited help. Meditation classes are an ongoing source of meaning and connection, and Morgan [name] is also making a series of memory/encouragement videos for people close to her.  Provided pastoral presence, empathic listening, normalization of feelings/experiences, and spiritual/emotional support. Morgan [name] plans to reach out to set up a follow-up session.   [place], [place], [facility] Pager [phone] Voicemail [phone]

## 2022-06-19 ENCOUNTER — Encounter: Payer: Self-pay | Admitting: General Practice

## 2022-06-19 NOTE — Progress Notes (Signed)
[facility] Spiritual Care Note  Received request from [facility] for support regarding a potential change in her living situation from the perspective of her health situation and energy level (which are declining, but not as swiftly as she may have anticipated).  Provided reflective listening and questions for discernment, helping Morgan [name] to identify and verbalize some of her concerns, needs, and priorities at present and anticipated in the future.  Morgan [name] plans to reach out as needed for follow-up support.   [place], [place], [facility] Pager [phone] Voicemail [phone]

## 2022-06-25 ENCOUNTER — Encounter: Payer: Self-pay | Admitting: General Practice

## 2022-06-25 DIAGNOSIS — Z4509 Encounter for adjustment and management of other cardiac device: Secondary | ICD-10-CM | POA: Diagnosis not present

## 2022-06-25 DIAGNOSIS — R002 Palpitations: Secondary | ICD-10-CM | POA: Diagnosis not present

## 2022-06-25 DIAGNOSIS — Z959 Presence of cardiac and vascular implant and graft, unspecified: Secondary | ICD-10-CM | POA: Diagnosis not present

## 2022-06-25 NOTE — Progress Notes (Signed)
[place] Note  Received call from [place] to process the big update of receiving approval for a new apartment, in which she will live with her daughter and her daughter's fiance for logistical support as Morgan [name]'s energy continues to decline. Morgan [name] notes that she is becoming increasing strategic about appointments, errands, and other commitments to avoid overtaxing herself. She ended the call when she needed to catch her breath after speaking for a while.  Morgan [name] plans to reach out again as needed.   [place], [place], [facility] Pager [phone] Voicemail [phone]

## 2022-06-27 NOTE — Progress Notes (Unsigned)
[facility] [address] [place], [place] [address] [phone]  Date:  07/01/2022   Name:  Morgan [name]   DOB:  1965-07-24   MRN:  [id number]  PCP:  Darreld Mclean, MD    Chief Complaint: No chief complaint on file.   History of Present Illness:  Morgan [name] is a 56 y.o. very pleasant female patient who presents with the following:  Patient seen today for physical exam Most recent visit with myself was a virtual visit in September History of advanced CLL, is currently on hospice care  Patient Active Problem List   Diagnosis Date Noted   Cellulitis 12/27/2021   Throat discomfort 03/12/2020   Hypothyroidism 11/18/2018   Fever 06/18/2018   Piriformis syndrome of right side 04/22/2017   Lateral epicondylitis of left elbow 04/06/2017   Solitary pulmonary nodule 10/29/2016   Chronic lymphocytic leukemia ([place]) 07/31/2016   Mass in neck 04/17/2016   Abdominal mass 04/17/2016   Asthma 12/12/2015   Multiple environmental allergies 12/12/2015   Chronic lower back pain 09/12/2015   Bipolar 1 disorder, depressed ([place]) 08/27/2015    Class: Chronic   Borderline personality disorder ([place]) 08/15/2015   Severe benzodiazepine use disorder ([place]) 08/15/2015   Alcohol use disorder, moderate, dependence ([employer]) 08/15/2015   PTSD (post-traumatic stress disorder) 08/15/2015   History of bipolar disorder 08/15/2015   Pre-syncope 05/17/2015   Skull deformity 05/17/2015   Patellofemoral syndrome of both knees 05/02/2015    Past Medical History:  Diagnosis Date   Anxiety    Arthritis    Asthma    Bipolar disorder ([place])    Bulging lumbar disc 07/19/05   Chronic headaches    Chronic lymphocytic leukemia ([place]) 07/31/2016   Colon polyps    Degenerative disorder of bone    Depression    History of borderline personality disorder    Hypothyroidism 2007   Developed after use of Lithium   IBS (irritable bowel syndrome)  1995   Pneumonia    Proctitis    Shingles 07/2018   Substance abuse ([place])    ativan last month    Past Surgical History:  Procedure Laterality Date   ABDOMINAL HYSTERECTOMY     APPENDECTOMY     BUNIONECTOMY Right 06/2012   CHOLECYSTECTOMY     OOPHORECTOMY     SHOULDER OPEN ROTATOR CUFF REPAIR Right 03/2010   TUBAL LIGATION      Social History   Tobacco Use   Smoking status: Former    Types: Cigarettes    Quit date: 07/20/1980    Years since quitting: 41.9   Smokeless tobacco: Never   Tobacco comments:    Also exposed to second hand smoke as a child  Vaping Use   Vaping Use: Never used  Substance Use Topics   Alcohol use: Not Currently    Comment: occ   Drug use: Not Currently    Family History  Problem Relation Age of Onset   Depression Mother    Hypertension Mother    Thyroid disease Father    Alzheimer's disease Father    Post-traumatic stress disorder Sister    Alcohol abuse Brother    Drug abuse Brother    Post-traumatic stress disorder Brother    COPD Maternal Grandmother    Heart disease Maternal Grandfather    Arthritis Paternal Grandmother    Diabetes Paternal Grandmother    Depression Paternal Grandmother    Alzheimer's disease Paternal  Grandmother    Heart disease Paternal Grandfather    Coronary artery disease Paternal Grandfather    Alcohol abuse Paternal Grandfather    Sleep apnea Cousin    Diabetes Maternal Uncle    Colon cancer Neg Hx    Breast cancer Neg Hx     Allergies  Allergen Reactions   Aripiprazole Anaphylaxis and Swelling   Ciprofloxacin Shortness Of Breath   Lamotrigine Rash and Other (See Comments)   Nitrofurantoin Hives   Other Anaphylaxis, Hives and Swelling    Walnuts and pine nuts   Peanut Oil Anaphylaxis, Hives, Swelling and Rash   Amoxicillin Hives and Rash    Update - pt states she has been tested by her allergist and is no longer allergic to penicillin 03/2021   Doxycycline Hives and Rash   Latex Rash and Other  (See Comments)   Meloxicam Itching and Other (See Comments)    Induces mania Interacts with Lithium   Naproxen Hives and Other (See Comments)    Interacts with lithium   Pramipexole Hives and Rash   Prednisone Other (See Comments)    Interacts with Lithium   Risperidone Hives and Rash   Sulfa Antibiotics Rash    "that leaves scarring"   Tape Itching and Rash    Steri-StripsT   Cortisone Other (See Comments)    Exacerbates the mania of her bipolar   Keflex [Cephalexin]     Pt developed a rash after taking high dose keflex for several days and Facial swelling   Percocet [Oxycodone-Acetaminophen] Other (See Comments)    Severe dizziness   Shingrix [Zoster Vac Recomb Adjuvanted] Swelling    rash   Vancomycin Itching    Itching on head and at IV insertion site where Vanc running   Cuvitru [Immune Globulin (Human)] Rash    Also allergic to Hizentra    Medication list has been reviewed and updated.  Current Outpatient Medications on File Prior to Visit  Medication Sig Dispense Refill   acetaminophen (TYLENOL) 500 MG tablet Take 1,000 mg by mouth every 6 (six) hours as needed for mild pain.     albuterol (VENTOLIN HFA) 108 (90 Base) MCG/ACT inhaler Inhale 1-2 puffs into the lungs every 6 (six) hours as needed for wheezing or shortness of breath. 18 g 6   allopurinol (ZYLOPRIM) 300 MG tablet Take 300 mg by mouth daily.     aspirin-acetaminophen-caffeine (EXCEDRIN EXTRA STRENGTH) 250-250-65 MG tablet Take 2 tablets by mouth every 6 (six) hours as needed for headache.     diphenhydrAMINE (BENADRYL) 25 MG tablet Take 1 tablet (25 mg total) by mouth every 6 (six) hours as needed for itching. (Patient taking differently: Take 25 mg by mouth every 6 (six) hours as needed for itching. Uses as premed prior to IVIG) 20 tablet 0   EPINEPHrine 0.3 mg/0.3 mL IJ SOAJ injection Inject 0.3 mLs (0.3 mg total) into the muscle as needed for anaphylaxis. 1 each prn   fexofenadine (ALLEGRA) 180 MG tablet  Take 180 mg by mouth daily.     fluticasone furoate-vilanterol (BREO ELLIPTA) 100-25 MCG/ACT AEPB Inhale 1 puff into the lungs daily. 1 each 11   hydrOXYzine (ATARAX) 25 MG tablet Take 1 tablet (25 mg total) by mouth every 8 (eight) hours as needed for itching. 20 tablet 0   hyoscyamine (LEVSIN) 0.125 MG tablet Take 1 tablet (0.125 mg total) by mouth as needed for cramping. Reported on 09/24/2015 10 tablet 2   ibuprofen (ADVIL) 200 MG tablet Take  200 mg by mouth every 6 (six) hours as needed (inflammation).     levothyroxine (SYNTHROID) 50 MCG tablet Take 1 tablet (50 mcg total) by mouth daily before breakfast. 90 tablet 3   lidocaine-prilocaine (EMLA) cream Apply 1 application  topically as needed (access port).     ondansetron (ZOFRAN) 8 MG tablet Take by mouth every 8 (eight) hours as needed for nausea or vomiting.     rizatriptan (MAXALT-MLT) 10 MG disintegrating tablet Take 1 tablet (10 mg total) by mouth as needed for migraine. May repeat in 2 hours if needed 9 tablet 11   No current facility-administered medications on file prior to visit.    Review of Systems:  As per HPI- otherwise negative.   Physical Examination: There were no vitals filed for this visit. There were no vitals filed for this visit. There is no height or weight on file to calculate BMI. Ideal Body Weight:    GEN: no acute distress. HEENT: Atraumatic, Normocephalic.  Ears and Nose: No external deformity. CV: RRR, No M/G/R. No JVD. No thrill. No extra heart sounds. PULM: CTA B, no wheezes, crackles, rhonchi. No retractions. No resp. distress. No accessory muscle use. ABD: S, NT, ND, +BS. No rebound. No HSM. EXTR: No c/c/e PSYCH: Normally interactive. Conversant.    Assessment and Plan: ***  Signed Lamar Blinks, MD

## 2022-06-30 ENCOUNTER — Encounter: Payer: Self-pay | Admitting: Family Medicine

## 2022-07-01 ENCOUNTER — Ambulatory Visit (INDEPENDENT_AMBULATORY_CARE_PROVIDER_SITE_OTHER): Payer: Medicare Other | Admitting: Family Medicine

## 2022-07-01 VITALS — BP 117/88 | HR 94 | Temp 97.6°F | Resp 18 | Wt 168.4 lb

## 2022-07-01 DIAGNOSIS — C9110 Chronic lymphocytic leukemia of B-cell type not having achieved remission: Secondary | ICD-10-CM

## 2022-07-01 DIAGNOSIS — Z0000 Encounter for general adult medical examination without abnormal findings: Secondary | ICD-10-CM | POA: Diagnosis not present

## 2022-07-01 NOTE — Patient Instructions (Addendum)
It was good to see you again today!  Please keep me posted and let me know if I can help in any way.  I hope you have a wonderful Christmas and New Year!    You might look at [facility]- they have therapeutic riding programs and likely would let you just come and hang out if you like!  Address: [address], [place], [place] [address] Phone: [phone]

## 2022-07-06 DIAGNOSIS — C9110 Chronic lymphocytic leukemia of B-cell type not having achieved remission: Secondary | ICD-10-CM | POA: Diagnosis not present

## 2022-07-08 DIAGNOSIS — Z959 Presence of cardiac and vascular implant and graft, unspecified: Secondary | ICD-10-CM | POA: Diagnosis not present

## 2022-07-09 ENCOUNTER — Encounter: Payer: Self-pay | Admitting: General Practice

## 2022-07-09 NOTE — Progress Notes (Signed)
[place] Spiritual Care Note  Morgan [name] phoned for spiritual and discernment support. She is continuing to process her physical decline, including increased need for support and assistance, which in turn represents a decrease in independence. She is is the process of moving into an apartment shared with her daughter and daughter's fiance, who will be key caregivers as needed. Meditation, prayer, and sacred reading continue to be big sources of support and meaning-making, particularly with the work of acceptance.  Provided reflective listening, spiritual and emotional support, normalization of feelings, affirmation of strengths, and assistance with discernment.  Surya plans to reach out again as needed.   [place], [place], [facility] Pager [phone] Voicemail [phone]

## 2022-07-14 ENCOUNTER — Encounter: Payer: Self-pay | Admitting: Family Medicine

## 2022-07-19 DIAGNOSIS — Z959 Presence of cardiac and vascular implant and graft, unspecified: Secondary | ICD-10-CM | POA: Diagnosis not present

## 2022-07-21 DIAGNOSIS — H2513 Age-related nuclear cataract, bilateral: Secondary | ICD-10-CM | POA: Diagnosis not present

## 2022-07-21 DIAGNOSIS — H3582 Retinal ischemia: Secondary | ICD-10-CM | POA: Diagnosis not present

## 2022-07-21 DIAGNOSIS — H5713 Ocular pain, bilateral: Secondary | ICD-10-CM | POA: Diagnosis not present

## 2022-07-22 DIAGNOSIS — Z7189 Other specified counseling: Secondary | ICD-10-CM | POA: Diagnosis not present

## 2022-07-22 DIAGNOSIS — Z515 Encounter for palliative care: Secondary | ICD-10-CM | POA: Diagnosis not present

## 2022-07-22 DIAGNOSIS — C9110 Chronic lymphocytic leukemia of B-cell type not having achieved remission: Secondary | ICD-10-CM | POA: Diagnosis not present

## 2022-07-27 ENCOUNTER — Encounter: Payer: Self-pay | Admitting: General Practice

## 2022-07-27 NOTE — Progress Notes (Signed)
Morgan [name] Spiritual Care Note  Followed up with Morgan [name] by phone per her request. She is processing her recent move into an apartment with her daughter and daughter's fiance for their caregiving support. She is also preparing for a change in frequency of appointments with her therapist due to coverage limitation and financial constraints. Morgan [name] continues to find meaning and connection in Morgan [name] and Morgan [name] support programming.  Provided pastoral presence, reflective listening, and emotional/spiritual support. Morgan [name] hopes to have schedule flexibility and energy enough for an in-person visit soon.   [place], [place], [facility] Pager [phone] Voicemail [phone]

## 2022-07-31 ENCOUNTER — Encounter: Payer: Self-pay | Admitting: General Practice

## 2022-07-31 NOTE — Progress Notes (Signed)
[place] Spiritual Care Note  Had Spiritual Care Virtual Visit via WebEx per Cathi's request for support regarding increased physical decline. She reports decreased energy (including increased need for rollator or wheelchair), difficulty eating due to esophageal discomfort/tightness, and increased pain and anxiety (which, she notes, are increasingly less helped by her usual self-care interventions, such as meditation). She notes that she remains hopeful to attend her daughter's (local) wedding next month and plans to develop a practice of visualizing herself in the church venue to help actualize her wish.  Provided pastoral presence, reflective listening, emotional support, and witness to her unfolding story. Caytlin continues to utilize her variety of support resources (from individual professionals to groups) and plans to reach out again as needed for follow-up support.   [place], [place], [facility] Pager [phone] Voicemail [phone]

## 2022-08-06 ENCOUNTER — Encounter: Payer: Self-pay | Admitting: Family Medicine

## 2022-08-07 ENCOUNTER — Encounter: Payer: Self-pay | Admitting: General Practice

## 2022-08-07 NOTE — Progress Notes (Signed)
[place] Spiritual Care Note  Alongside [facility], assisted Shandrea per her request in a ritual walking of the labyrinth in the healing garden. This is a regular and very comforting practice for [place], and today's occasion was marking continued health changes.  Plan to follow up with Creta on Tuesday 1/23 for a life-review collage-making project.   [place], [place], [facility] Pager [phone] Voicemail [phone]

## 2022-08-11 ENCOUNTER — Encounter: Payer: Self-pay | Admitting: General Practice

## 2022-08-11 NOTE — Progress Notes (Signed)
[place] Spiritual Care Note  Assisted Morgan [name] with the first phase of a special photo project, which served as a Theatre manager for storytelling and life review, particularly about her children's childhoods and relationships with them as adults. She plans to request a follow-up session in the coming week+.   [place], [place], [facility] Pager [phone] Voicemail [phone]

## 2022-08-20 ENCOUNTER — Encounter: Payer: Self-pay | Admitting: General Practice

## 2022-08-20 ENCOUNTER — Ambulatory Visit: Payer: Medicare Other | Admitting: Family Medicine

## 2022-08-20 NOTE — Progress Notes (Signed)
[place] Spiritual Care Note  Received and returned call from [facility] for emotional support regarding conflict and setting boundaries for self-care, particularly in her current setting of diminished energy.  Provided reflective listening and spiritual/emotional support through her processing. She plans to phone again as needed to schedule a Spiritual Care visit.   [place], [place], [facility] Pager [phone] Voicemail [phone]

## 2022-09-14 ENCOUNTER — Encounter: Payer: Self-pay | Admitting: General Practice

## 2022-09-14 NOTE — Progress Notes (Signed)
[place] Spiritual Care Note  Had follow-up visit in Healing Garden per [name]'s request. She enjoyed wheeling around to take in as many details as possible. Provided opportunity for her to share and process recent life events (daughter and son-in-law's wedding, beach trip with close friends) and priorities in the midst of continued health changes, including significant pain in back and hips. Her priorities include writing, devotional reading, and creating videos (of faith and encouragement, etc) for sharing on YouTube. Prayer and meditation continue to be key coping and meaning-making tools.  Morgan [name] plans to reach out to schedule a follow-up visit next week.     [place], [place], [facility] Pager [phone] Voicemail [phone]

## 2022-09-29 ENCOUNTER — Encounter: Payer: Self-pay | Admitting: General Practice

## 2022-09-29 NOTE — Progress Notes (Signed)
[place] Spiritual Care Note  Phoned Morgan [name] per her request so that she could share health updates and her hospice nurse's observation that she has declined notably (physically) in the last two weeks. Morgan [name] is working on processing these changes as she prepares for end of life by enjoying as much as she can, especially with the people closest to her. (She lives with her daughter Morgan [name] and son-in-law Morgan [name], and her son Morgan [name] is planning another visit from [facility].)  Provided empathic listening, normalization of feelings, and emotional support. Morgan [name] plans to contact chaplain again soon, including possibility of attending virtual Blood Cancer Support Group tonight.   [place], [place], [facility] Pager [phone] Voicemail [phone]

## 2022-10-07 DIAGNOSIS — Z959 Presence of cardiac and vascular implant and graft, unspecified: Secondary | ICD-10-CM | POA: Diagnosis not present

## 2022-10-12 ENCOUNTER — Encounter: Payer: Self-pay | Admitting: General Practice

## 2022-10-12 NOTE — Progress Notes (Signed)
[place] Spiritual Care Note  Left voicemail of care and support, in case reaching out has become more difficult for [facility] as she typically reaches out more frequently.   [place], [place], [facility] Pager [phone] Voicemail [phone]

## 2022-10-15 ENCOUNTER — Encounter: Payer: Self-pay | Admitting: General Practice

## 2022-10-15 NOTE — Progress Notes (Signed)
[place] Spiritual Care Note  Received call from [facility] to share and process updates about her health, including increasing pain and other symptoms, as well as decreasing energy. She is being very mindful about how she schedules visits in order to maximize energy sustainability for meeting priorities. Upcoming highlights include a visit from her son [name] in [place] and hoping to attend the [facility] [place] on 4/5, possibly with her adult children as caregivers.  We plan to follow up at 11am on Wednesday 4/10, format TBD as energy allows.   [place], [place], [facility] Pager [phone] Voicemail [phone]

## 2022-10-28 ENCOUNTER — Encounter: Payer: Self-pay | Admitting: Family Medicine

## 2022-11-19 ENCOUNTER — Inpatient Hospital Stay: Payer: 59 | Attending: Family Medicine | Admitting: General Practice

## 2022-11-19 NOTE — Progress Notes (Signed)
[facility] Spiritual Care Note  Visited with Nashali in Spiritual Care office as she had hoped (she had the energy to come in person instead of meeting virtually). She shared updates about her physical condition and spiritual growth/wellbeing as she moves further into her end-of-life journey. Recent highlights for Annissa have included visits/connections with family/friends, learning ukulele through [employer] and sharing songs with dear ones, utilizing prayer and meditation tools, and making special art to give to people close to her. All of this is part of her meaning-making and goodbye process.  Provided pastoral presence, reflective listening, pastoral reflection, emotional support, and spiritual encouragement.  Thomasenia plans to contact chaplain to schedule another visit (in person or virtual, as energy allows).   [address] [place], [place], [facility] Pager [phone] Voicemail [phone]

## 2022-11-24 DIAGNOSIS — H3563 Retinal hemorrhage, bilateral: Secondary | ICD-10-CM | POA: Diagnosis not present

## 2022-11-24 DIAGNOSIS — H3582 Retinal ischemia: Secondary | ICD-10-CM | POA: Diagnosis not present

## 2022-12-01 ENCOUNTER — Encounter: Payer: Self-pay | Admitting: General Practice

## 2022-12-01 NOTE — Progress Notes (Signed)
[facility] Spiritual Care Note  Received and returned voicemail from [place], leaving message about availability and encouragement to return call to schedule a follow-up.   [address] [place], [place], [facility] Pager [phone] Voicemail [phone]

## 2022-12-01 NOTE — Progress Notes (Signed)
[facility] Spiritual Care Note  Received call from [facility] in lieu of meeting via Blood Cancer Support Group. She used the opportunity to share and process health updates and special weekend travel plans. She is continuing to stay as active as energy allows, mindful that she needs to pace herself. Her faith and spiritual life remain the center of her meaning-making and sense of purpose.  Provided empathic listening and emotional and spiritual support. Morgan [name] plans to follow up by phone as needed.   [address] [place], [place], [facility] Pager [phone] Voicemail [phone]

## 2022-12-08 ENCOUNTER — Other Ambulatory Visit: Payer: Self-pay | Admitting: Family Medicine

## 2022-12-08 DIAGNOSIS — E039 Hypothyroidism, unspecified: Secondary | ICD-10-CM

## 2022-12-28 ENCOUNTER — Encounter: Payer: Self-pay | Admitting: General Practice

## 2022-12-28 NOTE — Progress Notes (Signed)
[facility] Spiritual Care Note  Left voicemail of care and encouragement.   [address] [place], [place], [facility] Pager [phone] Voicemail [phone]

## 2023-01-07 ENCOUNTER — Encounter: Payer: Self-pay | Admitting: General Practice

## 2023-01-07 NOTE — Progress Notes (Signed)
[facility] Spiritual Care Note  Scheduled a follow-up appointment in Spiritual Care office tomorrow ca 12:30, per Morgan [name]'s request. She reports that she has recently shifted to receiving 40h of help at home per week, which she hopes will help her get her needs met while freeing her to "let go."  Plan to follow up in more detail at visit tomorrow.   [address] [place], [place], [facility] Pager [phone] Voicemail [phone]

## 2023-01-08 ENCOUNTER — Encounter: Payer: Self-pay | Admitting: General Practice

## 2023-01-08 NOTE — Progress Notes (Signed)
[facility] Spiritual Care Note  Spiritual Care remains very important to [place] as a supportive container for processing her spiritual learnings as she continues to decline physically.  Met in Spiritual Care office with Dorethia's aide Alona Bene, providing empathic listening, pastoral reflection, and spiritual/emotional support as she shared updates about her health and recent meaning-making activities, including her church's community-building "olympics," [employer], birthday celebrations, and visits from friends.  Per Barbette's request, we scheduled a follow-up Teams meeting next week.    [address] [place], [place], [facility] Pager [phone] Voicemail [phone]

## 2023-01-14 ENCOUNTER — Encounter: Payer: Self-pay | Admitting: General Practice

## 2023-01-14 NOTE — Progress Notes (Signed)
[facility] Spiritual Care Note  Met with Leaann via [employer] for Spiritual Care Virtual Visit. Provided empathic listening and pastoral reflection as she shared and processed updates about her continued health decline, reflections on experiencing end of life, and faith-oriented life review. We also shared an extended period of prayer per her request. Set up a follow-up virtual visit on Monday 7/8 at 3:30.   [address] [place], [place], [facility] Pager [phone] Voicemail [phone]

## 2023-01-25 ENCOUNTER — Encounter: Payer: Self-pay | Admitting: General Practice

## 2023-01-25 NOTE — Progress Notes (Signed)
[facility] Spiritual Care Note  Had Spiritual Care Virtual Visit via Teams as planned. Kamyah used the opportunity to process significant recent events, including more turnover with her aides, which has caused distress and left gaps in support coverage. She reports meaningful connections with friends lately, including at church. She continues to suggest support outlets for her adult children and to normalize utilizing help.  Provided reflective listening and emotional/spiritual support. Enessa hopes to be able to attend at least part of the [employer] on Friday, and we scheduled another Spiritual Care Virtual Visit for Monday 7/15 at 1:30.   [address] [place], [place], [facility] Pager [phone] Voicemail [phone]

## 2023-02-01 ENCOUNTER — Encounter: Payer: Self-pay | Admitting: General Practice

## 2023-02-01 NOTE — Progress Notes (Signed)
[facility] Spiritual Care Note  Had follow-up Spiritual Care Virtual Visit with Morgan [name], and, at the beginning, her daughter Morgan [name], too. Morgan [name] has experienced significant upheaval with departures of home aides, as well as with a change in the number of hours of support that she qualifies for. This has been a big stressor to her and her family.  Morgan [name] plans a day trip to the beach with friends this week and is considering a simple ritual later in the month to recognize her "Prognosis Day" and to celebrate where she is now.  Provided empathic listening, pastoral reflection, normalization of feelings, and emotional support. We plan to follow up via Teams next Monday afternoon.   [address] [place], [place], [facility] Pager [phone] Voicemail [phone]

## 2023-02-08 ENCOUNTER — Encounter: Payer: Self-pay | Admitting: General Practice

## 2023-02-08 NOTE — Progress Notes (Signed)
[facility] Spiritual Care Note  Followed up with [facility] via Spiritual Care Virtual Visit. She was relieved to report that her daughter Morgan [name] is becoming her official caregiver (trained via an aide service) and that her church has started asking how to meet Morgan [name]'s needs and those of her daughter and son-in-law Morgan [name]. [name] continues to take note of changes in her physical needs and emotional energy as she progresses in hospice care. She remains dedicated to her spiritual practice of quiet time for prayer and Scripture reading, which is an anchor for her.  Provided empathic listening, pastoral reflection, and spiritual/emotional support. We plan to follow up via Teams next week.   [address] [place], [place], [facility] Pager [phone] Voicemail [phone]

## 2023-02-15 ENCOUNTER — Encounter: Payer: Self-pay | Admitting: General Practice

## 2023-02-15 NOTE — Progress Notes (Signed)
[facility] Spiritual Care Note  Followed up with [facility] via Teams for Spiritual Care Virtual Visit. She continues to grow in her spiritual life and to try to help the people close to her understand that her spiritual needs and goals are of central importance to her personal quality of life.  Provided reflective listening and pastoral reflection. We plan to follow up via Teams on Tuesday 02/23/2023 at 4pm.   [facility], [place], [facility] Pager [phone] Voicemail [phone]

## 2023-02-23 ENCOUNTER — Encounter: Payer: Self-pay | Admitting: General Practice

## 2023-02-23 NOTE — Progress Notes (Signed)
[facility] Spiritual Care Note  Had Spiritual Care Virtual Visit as requested. Morgan [name] continues to process her physical decline and its spiritual implications for her, and she is especially thinking a lot about her theological understanding of afterlife/unity with the divine after death.  Provided empathic listening, pastoral reflection, emotional/spiritual support, and affirmation of strengths. Continuing to follow for pastoral support, as this relationship has been very important to [name] as part of the support network she has worked so hard to develop.   [address] [place], [place], [facility] Pager [phone] Voicemail [phone]

## 2023-03-08 ENCOUNTER — Other Ambulatory Visit: Payer: Self-pay | Admitting: Family Medicine

## 2023-03-08 DIAGNOSIS — E039 Hypothyroidism, unspecified: Secondary | ICD-10-CM

## 2023-03-18 ENCOUNTER — Encounter: Payer: Self-pay | Admitting: General Practice

## 2023-03-18 NOTE — Progress Notes (Signed)
[facility] Spiritual Care Note  Had Spiritual Care Virtual Visit via Teams, providing opportunity for Morgan [name] to share and process health updates, including recent labs and reflections on how she is feeling. Church friends are surrounding her with support, both meals and visits. Morgan [name] continues to be grateful for her circle of professionals and other supporters who help her feel less isolated in her own experience by providing support and witness to her story.   We plan to follow up by Teams next week.   [address] [place], [place], [facility] Pager [phone] Voicemail [phone]

## 2023-03-26 ENCOUNTER — Encounter: Payer: Self-pay | Admitting: General Practice

## 2023-03-26 NOTE — Progress Notes (Signed)
[facility] Spiritual Care Note  Followed up for pastoral support via Teams. Morgan [name] is planning visits from her sister and mother while she feels able and is utilizing coping tools for distraction from pain.  Provided reflective listening, emotional and spiritual support, and prayer per request. We plan to follow up via Teams on Monday 9/16 at 3pm.   [facility], [place], [facility] Pager [phone] Voicemail [phone]

## 2023-03-30 DIAGNOSIS — H3563 Retinal hemorrhage, bilateral: Secondary | ICD-10-CM | POA: Diagnosis not present

## 2023-04-12 ENCOUNTER — Encounter: Payer: Self-pay | Admitting: General Practice

## 2023-04-12 NOTE — Progress Notes (Signed)
[facility] Spiritual Care Note  Followed up with Jill Side via a Teams Spiritual Care Virtual Visit. She reports several good days recently, citing the opportunity to be of meaning, service, and encouragement to others, which are core life and faith values of hers. Yonnie continues to use her reflection and meditation tools, as well as support from groups, church, and family to build meaning-rich days. We plan to follow up next Monday for another pastoral check-in.   [address] [place], [place], [facility] Pager [phone] Voicemail [phone]

## 2023-04-19 ENCOUNTER — Encounter: Payer: Self-pay | Admitting: General Practice

## 2023-04-19 NOTE — Progress Notes (Signed)
[facility] Spiritual Care Note  Followed up with [facility] via Teams. She is continuing to process her spiritual and emotional wellness through her decline, including deep theological reflection and managing visits from dear ones. Her mom and sister are coming for a significant visit this weekend. Marinda and I plan to debrief about their time together in a follow-up Teams session on Thursday 10/10 at 3pm.   Chaplain [place], [place], [facility] Pager [phone] Voicemail [phone]

## 2023-04-29 ENCOUNTER — Encounter: Payer: Self-pay | Admitting: General Practice

## 2023-04-29 NOTE — Progress Notes (Signed)
[facility] Spiritual Care Note  Followed up with Morgan [name] via Teams visit for spiritual and emotional support. Her faith remains strong even through recent GI pain and distress. We are looking forward to celebrating together at the Leukemia and Lymphoma Light the Night event on Saturday, which is a favorite occasion of hers and one she encourages the whole Blood Cancer Support Group to attend.   [address] [place], [place], [facility] Pager [phone] Voicemail [phone]

## 2023-05-06 ENCOUNTER — Encounter: Payer: Self-pay | Admitting: General Practice

## 2023-05-06 NOTE — Progress Notes (Signed)
[facility] Spiritual Care Note  Had brief Spiritual Care Virtual Visit for Morgan [name] to share and process health updates, particularly her unfolding readiness as she feels herself declining and prepares for end of life, as well as meaning-making experiences such as the recent Leukemia and Lymphoma Society Light the Night event and visits with dear ones. We plan to follow up next week for another pastoral check-in.   [address] [place], [place], [facility] Pager [phone] Voicemail [phone]

## 2023-05-12 ENCOUNTER — Encounter: Payer: Self-pay | Admitting: General Practice

## 2023-05-12 NOTE — Progress Notes (Signed)
[facility] Spiritual Care Note  Spoke with Leoria today for pastoral check-in and rescheduled her recent video appointment for 10/31, which will follow her much-anticipated visit from her mom. Frankie also reports ongoing problems with qualifying for coverage of the home assistance that she needs. She continues to receive meaningful visits and emotional support from friends at her church, in particular.  We plan to follow up by video on 10/31, but will reschedule again if needed.   [address] [place], [place], [facility] Pager [phone] Voicemail [phone]

## 2023-05-20 ENCOUNTER — Encounter: Payer: Self-pay | Admitting: General Practice

## 2023-05-20 NOTE — Progress Notes (Signed)
[facility] Spiritual Care Note  Followed up with [facility] via Spiritual Care Virtual Visit on Teams. She used the opportunity to process her mom's recent visit, which was a dear and memorable occasion. Morgan [name] reports continued physical decline, including waning energy and appetite. She describes herself as "ready to go" spiritually as she prepares for EOL, and at the same time is practicing being present to enjoy as much as possible. Her daughter and son-in-law, friends, and church members continue to be key supporters.  We plan to follow up via Teams on Friday 10/8 at noon if her energy allows.   [address] [place], [place], [facility] Pager [phone] Voicemail [phone]

## 2023-05-28 ENCOUNTER — Encounter: Payer: Self-pay | Admitting: General Practice

## 2023-05-28 NOTE — Progress Notes (Signed)
[facility] Spiritual Care Note  Followed up with [facility] via Teams. She is continuing to cope with and make meaning of her decline, focusing on priority people and experiences. Provided pastoral presence, reflective listening, emotional and spiritual support.  We plan to follow up by Teams next week as well.   [address] [place], [place], [facility] Pager [phone] Voicemail [phone]

## 2023-06-06 ENCOUNTER — Other Ambulatory Visit: Payer: Self-pay | Admitting: Family Medicine

## 2023-06-06 DIAGNOSIS — E039 Hypothyroidism, unspecified: Secondary | ICD-10-CM

## 2023-06-18 ENCOUNTER — Encounter: Payer: Self-pay | Admitting: General Practice

## 2023-06-18 NOTE — Progress Notes (Signed)
[facility] Spiritual Care Note  Morgan [name] (very uncharateristically) did not log on to scheduled Teams meeting today; phone goes straight to voicemail. Left voicemail encouraging return call if she is able.   [address] [place], [place], [facility] Pager [phone] Voicemail [phone]

## 2023-06-25 ENCOUNTER — Encounter: Payer: Self-pay | Admitting: General Practice

## 2023-06-25 NOTE — Progress Notes (Signed)
[facility] Spiritual Care Note  Followed up with Morgan [name] via Teams Spiritual Care Virtual Visit for brief check-in, as her energy was limited. She reports dealing with pneumonia recently, which limited Thanksgiving celebrations, but is looking forward to a visit from her son Morgan [name] and his fiance Morgan [name] in [place] in a week.  Provided compassionate presence, empathic listening, emotional support. Morgan [name] plans to reach out to schedule a follow-up appointment.   [address] [name], [place], [facility] Pager [phone] Voicemail [phone]

## 2023-07-05 ENCOUNTER — Encounter: Payer: Self-pay | Admitting: General Practice

## 2023-07-05 NOTE — Progress Notes (Signed)
[facility] Spiritual Care Note  Scheduled follow-up virtual visit for Thursday 12/19 at 2pm for Morgan [name] to share about her son's recent visit from [place] and how that relates to her EOL processing/preparation.   [address] [place], [place], [facility] Pager [phone] Voicemail [phone]

## 2023-07-08 ENCOUNTER — Encounter: Payer: Self-pay | Admitting: General Practice

## 2023-07-08 NOTE — Progress Notes (Signed)
[facility] Spiritual Care Note  Had Spiritual Care Virtual Visit with [facility] via Teams, providing opportunity for her to share about a number of recent losses and illnesses in her family, church, and social circles. She is processing some survivor guilt or perplexity/curiosity that can come with wondering why she has survived so long, outliving others around her--particularly when she senses others' feelings about her outliving their loved ones. Her top concerns right now are remaining centered and grounded through her daily prayer/quiet time and reorienting herself toward activities that feel meaningful (as some of her past hobbies feel less significant or too energy-intensive now).  We plan to follow up next week to continue the conversation.   [address] [place], [place], [facility] Pager [phone] Voicemail [phone]

## 2023-07-16 ENCOUNTER — Encounter: Payer: Self-pay | Admitting: General Practice

## 2023-07-16 NOTE — Progress Notes (Signed)
[facility] Spiritual Care Note  Followed up with [facility] via Spiritual Care Virtual Visit on Teams. She finding the language of "survival guilt" helpful and validating as she and people close to her process losses around them. Living so long beyond clinical expectations in general is also causing additional theological reflections and questions. Morgan [name] is working to keep to her goals and priorities (such as her spiritual life, close relationships, and meaningful activities) during her wakeful periods.  She plans to reach out to schedule a follow-up visit in the new year.   [address] [place], [place], [facility] Pager [phone] Voicemail [phone]

## 2023-08-11 ENCOUNTER — Telehealth: Payer: Self-pay | Admitting: Family Medicine

## 2023-08-11 NOTE — Telephone Encounter (Signed)
Copied from CRM [id number]. Topic: Medicare AWV >> Aug 11, 2023 11:46 AM Payton Doughty wrote: Reason for CRM: Called LVM 08/11/2023 to schedule AWV. Please schedule Virtual or Telehealth visits ONLY.   Verlee Rossetti; Care Guide Ambulatory Clinical Support [place] [facility] Direct Dial: [phone]

## 2023-08-20 ENCOUNTER — Encounter: Payer: Self-pay | Admitting: General Practice

## 2023-08-20 NOTE — Progress Notes (Signed)
[facility] Spiritual Care Note  Connected with Serin via phone per her request. She is processing the deaths of her father and and her son-in-law's mother, which naturally stir up feelings about her own anticipated death. She reports feeling scared about the finality, while also working to live her best life with the energy she has now. She is trying to spend time with loved ones for meaningful conversations and to continue to practice her daily prayer time, both of which are complicated by declining energy and increasing sleepiness.   Provided compassionate presence, reflective listening, affirmation of strengths, normalization of feelings, and blessing for peace/comfort/connection. Cailey plans to reach out again when needed.   [address] [place], [place], [facility] Pager [phone] Voicemail [phone]

## 2023-09-14 ENCOUNTER — Encounter: Payer: Self-pay | Admitting: General Practice

## 2023-09-14 NOTE — Progress Notes (Signed)
 [facility] Spiritual Care Note  Returned [employer], which she weakly suggested may be her last, with a blessing for her union with God, which is her deep and rich theological understanding of life after death.  Morgan [name] has on many occasions spoken with me about the eulogy she would like me to provide at her Celebration of Life service, and I included in the voicemail a reminder to her family of chaplain's ongoing availability for family support as desired.   [address] [place], [place], [place] [facility] Pager [phone] Voicemail [phone]

## 2023-09-16 ENCOUNTER — Telehealth: Payer: Self-pay

## 2023-09-16 NOTE — Telephone Encounter (Signed)
 Unsuccessful attempts to reach patient on preferred number listed in notes for scheduled AWV. Left message on voicemail okay to reschedule.

## 2023-09-20 ENCOUNTER — Telehealth: Payer: Self-pay | Admitting: Family Medicine

## 2023-09-20 NOTE — Telephone Encounter (Signed)
 Copied from CRM [id number]. Topic: General - Deceased Patient >> 10-09-2023  1:25 PM Deaijah H wrote: Name of caller: Keene Breath  Date of death: Oct 07, 2023   Name of funeral home: [facility] [facility]  Phone number of funeral home: [phone]  Provider that needs to sign form: Warner Mccreedy    Timeline for signing: N/A

## 2023-09-20 NOTE — Telephone Encounter (Signed)
 Logged into [employer] but I am not finding [employer] [employer] - pt is not at funeral home yet so death cert is not up yet  Time of death 11pm on 3/1 Gave them my cell- they will call me once certificate is available  I called and spoke with her daughter Binnie Kand was with her when she passed away

## 2023-10-19 DEATH — deceased
